# Patient Record
Sex: Female | Born: 1935
Health system: Southern US, Community
[De-identification: ages and names within clinical notes are randomized; demographics above are authoritative.]

## PROBLEM LIST (undated history)

## (undated) DIAGNOSIS — K635 Polyp of colon: Secondary | ICD-10-CM

## (undated) DIAGNOSIS — D649 Anemia, unspecified: Secondary | ICD-10-CM

## (undated) DIAGNOSIS — K648 Other hemorrhoids: Secondary | ICD-10-CM

## (undated) DIAGNOSIS — I80209 Phlebitis and thrombophlebitis of unspecified deep vessels of unspecified lower extremity: Secondary | ICD-10-CM

## (undated) DIAGNOSIS — S32020A Wedge compression fracture of second lumbar vertebra, initial encounter for closed fracture: Secondary | ICD-10-CM

## (undated) DIAGNOSIS — M359 Systemic involvement of connective tissue, unspecified: Secondary | ICD-10-CM

## (undated) DIAGNOSIS — F419 Anxiety disorder, unspecified: Secondary | ICD-10-CM

## (undated) DIAGNOSIS — H9190 Unspecified hearing loss, unspecified ear: Secondary | ICD-10-CM

## (undated) DIAGNOSIS — K5790 Diverticulosis of intestine, part unspecified, without perforation or abscess without bleeding: Secondary | ICD-10-CM

## (undated) DIAGNOSIS — Z8739 Personal history of other diseases of the musculoskeletal system and connective tissue: Secondary | ICD-10-CM

## (undated) DIAGNOSIS — K219 Gastro-esophageal reflux disease without esophagitis: Secondary | ICD-10-CM

## (undated) DIAGNOSIS — I10 Essential (primary) hypertension: Secondary | ICD-10-CM

## (undated) DIAGNOSIS — I82409 Acute embolism and thrombosis of unspecified deep veins of unspecified lower extremity: Secondary | ICD-10-CM

## (undated) DIAGNOSIS — H539 Unspecified visual disturbance: Secondary | ICD-10-CM

## (undated) DIAGNOSIS — R0789 Other chest pain: Secondary | ICD-10-CM

## (undated) DIAGNOSIS — M1990 Unspecified osteoarthritis, unspecified site: Secondary | ICD-10-CM

## (undated) DIAGNOSIS — K589 Irritable bowel syndrome without diarrhea: Secondary | ICD-10-CM

## (undated) DIAGNOSIS — I7090 Unspecified atherosclerosis: Secondary | ICD-10-CM

## (undated) HISTORY — DX: Polyp of colon: K63.5

## (undated) HISTORY — PX: REPLACEMENT TOTAL KNEE BILATERAL: SUR1225

## (undated) HISTORY — PX: EYE SURGERY: SHX253

## (undated) HISTORY — PX: DILATION AND CURETTAGE OF UTERUS: SHX78

## (undated) HISTORY — PX: APPENDECTOMY: SHX54

## (undated) HISTORY — DX: Phlebitis and thrombophlebitis of unspecified deep vessels of unspecified lower extremity: I80.209

## (undated) HISTORY — PX: JOINT REPLACEMENT: SHX530

## (undated) HISTORY — DX: Unspecified osteoarthritis, unspecified site: M19.90

---

## 2004-02-10 ENCOUNTER — Other Ambulatory Visit: Payer: Self-pay

## 2004-02-10 ENCOUNTER — Inpatient Hospital Stay: Payer: Self-pay | Admitting: Internal Medicine

## 2004-08-21 ENCOUNTER — Ambulatory Visit: Payer: Self-pay | Admitting: Unknown Physician Specialty

## 2006-03-11 ENCOUNTER — Ambulatory Visit: Payer: Self-pay | Admitting: Unknown Physician Specialty

## 2006-03-26 ENCOUNTER — Ambulatory Visit: Payer: Self-pay | Admitting: Unknown Physician Specialty

## 2006-05-20 ENCOUNTER — Encounter: Admission: RE | Admit: 2006-05-20 | Discharge: 2006-05-20 | Payer: Self-pay | Admitting: Orthopedic Surgery

## 2006-05-22 ENCOUNTER — Ambulatory Visit (HOSPITAL_BASED_OUTPATIENT_CLINIC_OR_DEPARTMENT_OTHER): Admission: RE | Admit: 2006-05-22 | Discharge: 2006-05-22 | Payer: Self-pay | Admitting: Orthopedic Surgery

## 2006-09-23 ENCOUNTER — Ambulatory Visit (HOSPITAL_BASED_OUTPATIENT_CLINIC_OR_DEPARTMENT_OTHER): Admission: RE | Admit: 2006-09-23 | Discharge: 2006-09-23 | Payer: Self-pay | Admitting: Orthopedic Surgery

## 2007-04-02 ENCOUNTER — Ambulatory Visit: Payer: Self-pay | Admitting: Unknown Physician Specialty

## 2007-04-13 ENCOUNTER — Ambulatory Visit: Payer: Self-pay | Admitting: Unknown Physician Specialty

## 2007-12-28 ENCOUNTER — Ambulatory Visit: Payer: Self-pay | Admitting: Rheumatology

## 2008-06-09 ENCOUNTER — Ambulatory Visit: Payer: Self-pay | Admitting: Unknown Physician Specialty

## 2008-11-22 ENCOUNTER — Ambulatory Visit: Payer: Self-pay | Admitting: Unknown Physician Specialty

## 2009-05-29 ENCOUNTER — Ambulatory Visit: Payer: Self-pay | Admitting: Unknown Physician Specialty

## 2009-06-12 ENCOUNTER — Ambulatory Visit: Payer: Self-pay | Admitting: Unknown Physician Specialty

## 2010-06-14 ENCOUNTER — Ambulatory Visit: Payer: Self-pay | Admitting: Obstetrics and Gynecology

## 2011-03-04 LAB — HM PAP SMEAR: HM Pap smear: NORMAL

## 2011-05-27 DIAGNOSIS — M722 Plantar fascial fibromatosis: Secondary | ICD-10-CM | POA: Diagnosis not present

## 2011-06-19 ENCOUNTER — Ambulatory Visit: Payer: Self-pay | Admitting: Internal Medicine

## 2011-06-19 DIAGNOSIS — Z1231 Encounter for screening mammogram for malignant neoplasm of breast: Secondary | ICD-10-CM | POA: Diagnosis not present

## 2011-06-20 DIAGNOSIS — M069 Rheumatoid arthritis, unspecified: Secondary | ICD-10-CM | POA: Diagnosis not present

## 2011-08-08 ENCOUNTER — Emergency Department: Payer: Self-pay | Admitting: Emergency Medicine

## 2011-08-08 DIAGNOSIS — Z79899 Other long term (current) drug therapy: Secondary | ICD-10-CM | POA: Diagnosis not present

## 2011-08-08 DIAGNOSIS — R079 Chest pain, unspecified: Secondary | ICD-10-CM | POA: Diagnosis not present

## 2011-08-08 DIAGNOSIS — M069 Rheumatoid arthritis, unspecified: Secondary | ICD-10-CM | POA: Diagnosis not present

## 2011-08-08 DIAGNOSIS — R0789 Other chest pain: Secondary | ICD-10-CM | POA: Diagnosis not present

## 2011-08-08 LAB — COMPREHENSIVE METABOLIC PANEL
Albumin: 3.9 g/dL (ref 3.4–5.0)
Alkaline Phosphatase: 65 U/L (ref 50–136)
Anion Gap: 12 (ref 7–16)
BUN: 12 mg/dL (ref 7–18)
Bilirubin,Total: 0.5 mg/dL (ref 0.2–1.0)
Calcium, Total: 9 mg/dL (ref 8.5–10.1)
Chloride: 106 mmol/L (ref 98–107)
Co2: 26 mmol/L (ref 21–32)
Creatinine: 0.59 mg/dL — ABNORMAL LOW (ref 0.60–1.30)
EGFR (African American): >60
EGFR (Non-African Amer.): >60
Glucose: 92 mg/dL (ref 65–99)
Osmolality: 286 (ref 275–301)
Potassium: 4.1 mmol/L (ref 3.5–5.1)
SGOT(AST): 34 U/L (ref 15–37)
SGPT (ALT): 23 U/L
Sodium: 144 mmol/L (ref 136–145)
Total Protein: 7.3 g/dL (ref 6.4–8.2)

## 2011-08-08 LAB — CBC
HCT: 40.8 % (ref 35.0–47.0)
HGB: 13.6 g/dL (ref 12.0–16.0)
MCH: 32 pg (ref 26.0–34.0)
MCHC: 33.3 g/dL (ref 32.0–36.0)
MCV: 96 fL (ref 80–100)
Platelet: 264 10*3/uL (ref 150–440)
RBC: 4.23 10*6/uL (ref 3.80–5.20)
RDW: 14.4 % (ref 11.5–14.5)
WBC: 5.8 10*3/uL (ref 3.6–11.0)

## 2011-08-08 LAB — PROTIME-INR
INR: 0.9
Prothrombin Time: 12.6 secs (ref 11.5–14.7)

## 2011-08-08 LAB — TROPONIN I
Troponin-I: <0.02 ng/mL
Troponin-I: <0.02 ng/mL

## 2011-08-08 LAB — CK TOTAL AND CKMB (NOT AT ARMC)
CK, Total: 152 U/L (ref 21–215)
CK-MB: 1.9 ng/mL (ref 0.5–3.6)

## 2011-08-19 DIAGNOSIS — M069 Rheumatoid arthritis, unspecified: Secondary | ICD-10-CM | POA: Diagnosis not present

## 2011-08-19 DIAGNOSIS — M722 Plantar fascial fibromatosis: Secondary | ICD-10-CM | POA: Diagnosis not present

## 2011-08-29 DIAGNOSIS — Z800 Family history of malignant neoplasm of digestive organs: Secondary | ICD-10-CM | POA: Diagnosis not present

## 2011-08-29 DIAGNOSIS — R079 Chest pain, unspecified: Secondary | ICD-10-CM | POA: Diagnosis not present

## 2011-08-30 DIAGNOSIS — R0609 Other forms of dyspnea: Secondary | ICD-10-CM | POA: Diagnosis not present

## 2011-08-30 DIAGNOSIS — R0989 Other specified symptoms and signs involving the circulatory and respiratory systems: Secondary | ICD-10-CM | POA: Diagnosis not present

## 2011-08-30 DIAGNOSIS — R079 Chest pain, unspecified: Secondary | ICD-10-CM | POA: Diagnosis not present

## 2011-08-30 DIAGNOSIS — I4949 Other premature depolarization: Secondary | ICD-10-CM | POA: Diagnosis not present

## 2011-09-10 DIAGNOSIS — R0602 Shortness of breath: Secondary | ICD-10-CM | POA: Diagnosis not present

## 2011-09-10 DIAGNOSIS — R079 Chest pain, unspecified: Secondary | ICD-10-CM | POA: Diagnosis not present

## 2011-09-12 ENCOUNTER — Emergency Department: Payer: Self-pay | Admitting: *Deleted

## 2011-09-12 DIAGNOSIS — M5380 Other specified dorsopathies, site unspecified: Secondary | ICD-10-CM | POA: Diagnosis not present

## 2011-09-12 DIAGNOSIS — M62838 Other muscle spasm: Secondary | ICD-10-CM | POA: Diagnosis not present

## 2011-09-12 DIAGNOSIS — S139XXA Sprain of joints and ligaments of unspecified parts of neck, initial encounter: Secondary | ICD-10-CM | POA: Diagnosis not present

## 2011-09-12 DIAGNOSIS — M069 Rheumatoid arthritis, unspecified: Secondary | ICD-10-CM | POA: Diagnosis not present

## 2011-09-12 DIAGNOSIS — Z7982 Long term (current) use of aspirin: Secondary | ICD-10-CM | POA: Diagnosis not present

## 2011-09-12 DIAGNOSIS — Z79899 Other long term (current) drug therapy: Secondary | ICD-10-CM | POA: Diagnosis not present

## 2011-09-17 DIAGNOSIS — R0789 Other chest pain: Secondary | ICD-10-CM | POA: Diagnosis not present

## 2011-09-18 DIAGNOSIS — M064 Inflammatory polyarthropathy: Secondary | ICD-10-CM | POA: Diagnosis not present

## 2011-09-18 DIAGNOSIS — M542 Cervicalgia: Secondary | ICD-10-CM | POA: Diagnosis not present

## 2011-09-18 DIAGNOSIS — M069 Rheumatoid arthritis, unspecified: Secondary | ICD-10-CM | POA: Diagnosis not present

## 2011-09-20 DIAGNOSIS — H26499 Other secondary cataract, unspecified eye: Secondary | ICD-10-CM | POA: Diagnosis not present

## 2011-10-08 DIAGNOSIS — J069 Acute upper respiratory infection, unspecified: Secondary | ICD-10-CM | POA: Diagnosis not present

## 2011-10-21 DIAGNOSIS — M549 Dorsalgia, unspecified: Secondary | ICD-10-CM | POA: Diagnosis not present

## 2011-12-05 DIAGNOSIS — R059 Cough, unspecified: Secondary | ICD-10-CM | POA: Diagnosis not present

## 2011-12-05 DIAGNOSIS — M65839 Other synovitis and tenosynovitis, unspecified forearm: Secondary | ICD-10-CM | POA: Diagnosis not present

## 2011-12-05 DIAGNOSIS — R079 Chest pain, unspecified: Secondary | ICD-10-CM | POA: Diagnosis not present

## 2011-12-05 DIAGNOSIS — R05 Cough: Secondary | ICD-10-CM | POA: Diagnosis not present

## 2011-12-12 DIAGNOSIS — R05 Cough: Secondary | ICD-10-CM | POA: Diagnosis not present

## 2011-12-12 DIAGNOSIS — K219 Gastro-esophageal reflux disease without esophagitis: Secondary | ICD-10-CM | POA: Diagnosis not present

## 2011-12-12 DIAGNOSIS — R059 Cough, unspecified: Secondary | ICD-10-CM | POA: Diagnosis not present

## 2011-12-17 DIAGNOSIS — R059 Cough, unspecified: Secondary | ICD-10-CM | POA: Diagnosis not present

## 2011-12-17 DIAGNOSIS — R05 Cough: Secondary | ICD-10-CM | POA: Diagnosis not present

## 2011-12-17 DIAGNOSIS — R918 Other nonspecific abnormal finding of lung field: Secondary | ICD-10-CM | POA: Diagnosis not present

## 2011-12-26 DIAGNOSIS — M064 Inflammatory polyarthropathy: Secondary | ICD-10-CM | POA: Diagnosis not present

## 2012-01-01 ENCOUNTER — Ambulatory Visit: Payer: Self-pay | Admitting: Rheumatology

## 2012-01-01 DIAGNOSIS — R079 Chest pain, unspecified: Secondary | ICD-10-CM | POA: Diagnosis not present

## 2012-01-01 DIAGNOSIS — R091 Pleurisy: Secondary | ICD-10-CM | POA: Diagnosis not present

## 2012-01-01 DIAGNOSIS — J984 Other disorders of lung: Secondary | ICD-10-CM | POA: Diagnosis not present

## 2012-01-02 ENCOUNTER — Ambulatory Visit: Payer: Self-pay | Admitting: Rheumatology

## 2012-01-02 DIAGNOSIS — N63 Unspecified lump in unspecified breast: Secondary | ICD-10-CM | POA: Diagnosis not present

## 2012-01-02 DIAGNOSIS — R928 Other abnormal and inconclusive findings on diagnostic imaging of breast: Secondary | ICD-10-CM | POA: Diagnosis not present

## 2012-01-02 LAB — HM MAMMOGRAPHY: HM Mammogram: NORMAL

## 2012-01-20 DIAGNOSIS — R05 Cough: Secondary | ICD-10-CM | POA: Diagnosis not present

## 2012-01-20 DIAGNOSIS — Z79899 Other long term (current) drug therapy: Secondary | ICD-10-CM | POA: Diagnosis not present

## 2012-01-20 DIAGNOSIS — M069 Rheumatoid arthritis, unspecified: Secondary | ICD-10-CM | POA: Diagnosis not present

## 2012-01-20 DIAGNOSIS — R059 Cough, unspecified: Secondary | ICD-10-CM | POA: Diagnosis not present

## 2012-01-20 DIAGNOSIS — Z800 Family history of malignant neoplasm of digestive organs: Secondary | ICD-10-CM | POA: Diagnosis not present

## 2012-02-19 LAB — HM COLONOSCOPY

## 2012-03-03 LAB — HM COLONOSCOPY: HM Colonoscopy: NORMAL

## 2012-03-04 DIAGNOSIS — M25519 Pain in unspecified shoulder: Secondary | ICD-10-CM | POA: Diagnosis not present

## 2012-03-04 DIAGNOSIS — M064 Inflammatory polyarthropathy: Secondary | ICD-10-CM | POA: Diagnosis not present

## 2012-03-17 ENCOUNTER — Ambulatory Visit: Payer: Self-pay | Admitting: Unknown Physician Specialty

## 2012-03-17 DIAGNOSIS — Z79899 Other long term (current) drug therapy: Secondary | ICD-10-CM | POA: Diagnosis not present

## 2012-03-17 DIAGNOSIS — Z800 Family history of malignant neoplasm of digestive organs: Secondary | ICD-10-CM | POA: Diagnosis not present

## 2012-03-17 DIAGNOSIS — Z7982 Long term (current) use of aspirin: Secondary | ICD-10-CM | POA: Diagnosis not present

## 2012-03-17 DIAGNOSIS — Z881 Allergy status to other antibiotic agents status: Secondary | ICD-10-CM | POA: Diagnosis not present

## 2012-03-17 DIAGNOSIS — M069 Rheumatoid arthritis, unspecified: Secondary | ICD-10-CM | POA: Diagnosis not present

## 2012-03-17 DIAGNOSIS — M19049 Primary osteoarthritis, unspecified hand: Secondary | ICD-10-CM | POA: Diagnosis not present

## 2012-03-17 DIAGNOSIS — D649 Anemia, unspecified: Secondary | ICD-10-CM | POA: Diagnosis not present

## 2012-03-17 DIAGNOSIS — Z1211 Encounter for screening for malignant neoplasm of colon: Secondary | ICD-10-CM | POA: Diagnosis not present

## 2012-03-17 DIAGNOSIS — R42 Dizziness and giddiness: Secondary | ICD-10-CM | POA: Diagnosis not present

## 2012-03-17 DIAGNOSIS — K5730 Diverticulosis of large intestine without perforation or abscess without bleeding: Secondary | ICD-10-CM | POA: Diagnosis not present

## 2012-03-17 DIAGNOSIS — Z888 Allergy status to other drugs, medicaments and biological substances status: Secondary | ICD-10-CM | POA: Diagnosis not present

## 2012-03-17 DIAGNOSIS — E785 Hyperlipidemia, unspecified: Secondary | ICD-10-CM | POA: Diagnosis not present

## 2012-03-17 DIAGNOSIS — I10 Essential (primary) hypertension: Secondary | ICD-10-CM | POA: Diagnosis not present

## 2012-03-17 DIAGNOSIS — Z96659 Presence of unspecified artificial knee joint: Secondary | ICD-10-CM | POA: Diagnosis not present

## 2012-03-17 DIAGNOSIS — K648 Other hemorrhoids: Secondary | ICD-10-CM | POA: Diagnosis not present

## 2012-03-23 DIAGNOSIS — M069 Rheumatoid arthritis, unspecified: Secondary | ICD-10-CM | POA: Diagnosis not present

## 2012-03-23 DIAGNOSIS — Z79899 Other long term (current) drug therapy: Secondary | ICD-10-CM | POA: Diagnosis not present

## 2012-03-24 DIAGNOSIS — E785 Hyperlipidemia, unspecified: Secondary | ICD-10-CM | POA: Diagnosis not present

## 2012-03-24 DIAGNOSIS — M899 Disorder of bone, unspecified: Secondary | ICD-10-CM | POA: Diagnosis not present

## 2012-03-24 DIAGNOSIS — Z79899 Other long term (current) drug therapy: Secondary | ICD-10-CM | POA: Diagnosis not present

## 2012-03-31 DIAGNOSIS — I10 Essential (primary) hypertension: Secondary | ICD-10-CM | POA: Diagnosis not present

## 2012-03-31 DIAGNOSIS — M069 Rheumatoid arthritis, unspecified: Secondary | ICD-10-CM | POA: Diagnosis not present

## 2012-04-08 DIAGNOSIS — S61209A Unspecified open wound of unspecified finger without damage to nail, initial encounter: Secondary | ICD-10-CM | POA: Diagnosis not present

## 2012-04-29 DIAGNOSIS — M79609 Pain in unspecified limb: Secondary | ICD-10-CM | POA: Diagnosis not present

## 2012-04-30 DIAGNOSIS — S61409A Unspecified open wound of unspecified hand, initial encounter: Secondary | ICD-10-CM | POA: Diagnosis not present

## 2012-04-30 DIAGNOSIS — M19049 Primary osteoarthritis, unspecified hand: Secondary | ICD-10-CM | POA: Diagnosis not present

## 2012-05-18 DIAGNOSIS — M069 Rheumatoid arthritis, unspecified: Secondary | ICD-10-CM | POA: Diagnosis not present

## 2012-05-18 DIAGNOSIS — Z79899 Other long term (current) drug therapy: Secondary | ICD-10-CM | POA: Diagnosis not present

## 2012-06-11 DIAGNOSIS — J069 Acute upper respiratory infection, unspecified: Secondary | ICD-10-CM | POA: Diagnosis not present

## 2012-07-13 DIAGNOSIS — M069 Rheumatoid arthritis, unspecified: Secondary | ICD-10-CM | POA: Diagnosis not present

## 2012-07-13 DIAGNOSIS — M65839 Other synovitis and tenosynovitis, unspecified forearm: Secondary | ICD-10-CM | POA: Diagnosis not present

## 2012-07-20 DIAGNOSIS — M722 Plantar fascial fibromatosis: Secondary | ICD-10-CM | POA: Diagnosis not present

## 2012-07-20 DIAGNOSIS — M7750 Other enthesopathy of unspecified foot: Secondary | ICD-10-CM | POA: Diagnosis not present

## 2012-07-20 DIAGNOSIS — L608 Other nail disorders: Secondary | ICD-10-CM | POA: Diagnosis not present

## 2012-08-17 DIAGNOSIS — M069 Rheumatoid arthritis, unspecified: Secondary | ICD-10-CM | POA: Diagnosis not present

## 2012-08-17 DIAGNOSIS — J069 Acute upper respiratory infection, unspecified: Secondary | ICD-10-CM | POA: Diagnosis not present

## 2012-08-18 DIAGNOSIS — R300 Dysuria: Secondary | ICD-10-CM | POA: Diagnosis not present

## 2012-08-24 DIAGNOSIS — M069 Rheumatoid arthritis, unspecified: Secondary | ICD-10-CM | POA: Diagnosis not present

## 2012-10-12 DIAGNOSIS — L608 Other nail disorders: Secondary | ICD-10-CM | POA: Diagnosis not present

## 2012-11-10 DIAGNOSIS — J069 Acute upper respiratory infection, unspecified: Secondary | ICD-10-CM | POA: Diagnosis not present

## 2012-11-16 DIAGNOSIS — Z79899 Other long term (current) drug therapy: Secondary | ICD-10-CM | POA: Diagnosis not present

## 2012-11-16 DIAGNOSIS — M069 Rheumatoid arthritis, unspecified: Secondary | ICD-10-CM | POA: Diagnosis not present

## 2012-12-04 DIAGNOSIS — H04129 Dry eye syndrome of unspecified lacrimal gland: Secondary | ICD-10-CM | POA: Diagnosis not present

## 2012-12-04 DIAGNOSIS — Z961 Presence of intraocular lens: Secondary | ICD-10-CM | POA: Diagnosis not present

## 2012-12-22 DIAGNOSIS — M069 Rheumatoid arthritis, unspecified: Secondary | ICD-10-CM | POA: Diagnosis not present

## 2013-03-02 ENCOUNTER — Encounter: Payer: Self-pay | Admitting: Internal Medicine

## 2013-03-02 ENCOUNTER — Ambulatory Visit (INDEPENDENT_AMBULATORY_CARE_PROVIDER_SITE_OTHER): Payer: Medicare Other | Admitting: Internal Medicine

## 2013-03-02 VITALS — BP 124/66 | HR 87 | Temp 98.7°F | Resp 14 | Ht 65.5 in | Wt 147.5 lb

## 2013-03-02 DIAGNOSIS — I679 Cerebrovascular disease, unspecified: Secondary | ICD-10-CM

## 2013-03-02 DIAGNOSIS — M069 Rheumatoid arthritis, unspecified: Secondary | ICD-10-CM | POA: Diagnosis not present

## 2013-03-02 DIAGNOSIS — G4700 Insomnia, unspecified: Secondary | ICD-10-CM

## 2013-03-02 DIAGNOSIS — R42 Dizziness and giddiness: Secondary | ICD-10-CM

## 2013-03-02 MED ORDER — TETANUS-DIPHTH-ACELL PERTUSSIS 5-2.5-18.5 LF-MCG/0.5 IM SUSP: 0.5000 mL | Freq: Once | INTRAMUSCULAR | Status: DC

## 2013-03-02 NOTE — Progress Notes (Addendum)
Patient ID: Teresa Lin, female   DOB: 10-24-1931, 79 y.o.   MRN: [id number]   Patient Active Problem List   Diagnosis Date Noted  . Insomnia 03/03/2013  . Vertigo due to cerebrovascular disease 03/03/2013  . Rheumatoid arthritis 03/03/2013    Subjective:  CC:   Chief Complaint  Patient presents with  . Establish Care    HPI:   Teresa Lin is a 79 y.o. female who presents as a new patient to establish primary care with the chief complaint of Cc:  Recurrent episodes of dizziness accompanied by nausea,  Last for a day or less, true vertigo .  Has to go lie down  Prior evaluation by neurologist Fayrene Fearing  Dr. Sandria Manly, with  MRI brain done at [facility] .  He diagnosed TIAS  As the etiology of her recurrent episodes. Annett Gula OTC Bonine (meclozine). Previously a patient of Dr Lin Givens,  And then Dr. Randa Lynn .  Last seen in spring for a URI.   [facility]  Had an x ray after several months ,  4 cracked ribs.  Has not had a bone density test in a few years  Has RA, managed with MTX and Placquenil by Lavenia Atlas.  On MRTX x 3 yrs,  Remicaide reaction a year ago  Then placquenil started, Last slit  lamp exam in May at [facility] ,  Has had prior cataracts with lens replacement by dr. Lauraine Rinne   Has occasional leg cramps which occur frequently at night or at rest .   does water aerobics 3/weekly.  Not strenuous.   Carpal tunnel bilateral surgery in the last 10 years.   7 children,  widowed 18 yrs ago.  2 live nearby .  bowel habits changed from constipation  to 2 to 3 times daily.  Usually formed.   Last colonoscopy  Normal last year Dr Mechele Collin along with an EGD  Last pelvic over 2 years ago.  No history of abnormal PAP smears.  Last mammogram: 2013 ,  Due now history of abnl no biopsies    Arthroscopy followed by DVT, [place]  Followed by bilateral TKRs at [facility] by Lessie Dings complicated by infection of unclear etiology finally resolved with vancomycin and other abx for  a total of 3 months .  ER evaluation chest pain,  Now on prilosec .    Uses ibuprofen prn to avoid prednisone during RA flares.   Insomnia:  She has frequent awakenings,  Dicky Doe   Past Medical History  Diagnosis Date  . Arthritis   . Phlebitis and thrombophlebitis of deep veins of lower extremities     after surgery  . Colon polyps     Past Surgical History  Procedure Laterality Date  . Appendectomy    . Joint replacement Bilateral     knee replacement    Family History  Problem Relation Age of Onset  . Heart disease Mother   . Hypertension Mother   . Cancer Mother     colon ca  . Heart disease Father   . Hypertension Father   . Hypertension Sister   . Multiple sclerosis Daughter   . Multiple sclerosis Son   . Multiple sclerosis Son     History   Social History  . Marital Status: Widowed    Spouse Name: N/A    Number of Children: N/A  . Years of Education: N/A   Occupational History  . Not on file.  Social History Main Topics  . Smoking status: Never Smoker   . Smokeless tobacco: Never Used  . Alcohol Use: 1.2 oz/week    2 Cans of beer per week     Comment: occasionally   . Drug Use: No  . Sexual Activity: No   Other Topics Concern  . Not on file   Social History Narrative  . No narrative on file       Allergies  Allergen Reactions  . Synvisc [Hylan G-F 20] Swelling     Review of Systems:   The remainder of the review of systems was negative except those addressed in the HPI.       Objective:  BP 124/66  Pulse 87  Temp(Src) 98.7 F (37.1 C) (Oral)  Resp 14  Ht 5' 5.5" (1.664 m)  Wt 147 lb 8 oz (66.906 kg)  BMI 24.16 kg/m2  SpO2 98%  General appearance: alert, cooperative and appears stated age Ears: normal TM's and external ear canals both ears Throat: lips, mucosa, and tongue normal; teeth and gums normal Neck: no adenopathy, no carotid bruit, supple, symmetrical, trachea midline and thyroid not enlarged,  symmetric, no tenderness/mass/nodules Back: symmetric, no curvature. ROM normal. No CVA tenderness. Lungs: clear to auscultation bilaterally Heart: regular rate and rhythm, S1, S2 normal, no murmur, click, rub or gallop Abdomen: soft, non-tender; bowel sounds normal; no masses,  no organomegaly Pulses: 2+ and symmetric Skin: Skin color, texture, turgor normal. No rashes or lesions Lymph nodes: Cervical, supraclavicular, and axillary nodes normal.  Assessment and Plan:  Insomnia Managed with Lunesta  Vertigo due to cerebrovascular disease Attributed to TIA's by Dr Sandria Manly, prior neurology evaluation.  records requested . MRI not available on [facility]. Return for fasting lipids   Rheumatoid arthritis Managed wit MTC and Plaquenil, up to date on eye exAams    Updated Medication List Outpatient Encounter Prescriptions as of 03/02/2013  Medication Sig Dispense Refill  . aspirin 81 MG tablet Take 81 mg by mouth daily.      . calcium-vitamin D (OSCAL WITH D) 500-200 MG-UNIT per tablet Take 1 tablet by mouth.      . folic acid (FOLVITE) 1 MG tablet Take 1 mg by mouth daily.      . hydroxychloroquine (PLAQUENIL) 200 MG tablet Take 200 mg by mouth daily.      . methotrexate (RHEUMATREX) 2.5 MG tablet Take 17.5 mg by mouth once a week.      . Multiple Vitamins-Minerals (MULTIVITAMIN WITH MINERALS) tablet Take 1 tablet by mouth daily.      Marland Kitchen omeprazole (PRILOSEC) 20 MG capsule Take 20 mg by mouth daily.      . predniSONE (DELTASONE) 5 MG tablet Take 5 mg by mouth daily.      . TDaP (BOOSTRIX) 5-2.5-18.5 LF-MCG/0.5 injection Inject 0.5 mLs into the muscle once.  0.5 mL  0   No facility-administered encounter medications on file as of 03/02/2013.     Orders Placed This Encounter  Procedures  . HM MAMMOGRAPHY  . HM PAP SMEAR  . HM COLONOSCOPY    No Follow-up on file.

## 2013-03-02 NOTE — Patient Instructions (Addendum)
You need the flu,  TdaP and Pneumonia vaccines.  The TdaP is not covered by Medicare so I have given you an rx to take to your pharmacy   Return for fasting labs and vaccines if you want to get them

## 2013-03-03 ENCOUNTER — Encounter: Payer: Self-pay | Admitting: Internal Medicine

## 2013-03-03 DIAGNOSIS — I679 Cerebrovascular disease, unspecified: Secondary | ICD-10-CM | POA: Insufficient documentation

## 2013-03-03 DIAGNOSIS — M0600 Rheumatoid arthritis without rheumatoid factor, unspecified site: Secondary | ICD-10-CM | POA: Insufficient documentation

## 2013-03-03 DIAGNOSIS — R42 Dizziness and giddiness: Secondary | ICD-10-CM | POA: Insufficient documentation

## 2013-03-03 DIAGNOSIS — G4700 Insomnia, unspecified: Secondary | ICD-10-CM | POA: Insufficient documentation

## 2013-03-03 NOTE — Assessment & Plan Note (Signed)
Managed with [facility]

## 2013-03-03 NOTE — Assessment & Plan Note (Addendum)
Attributed to TIA's by Dr Sandria Manly, prior neurology evaluation.  records requested . MRI not available on [facility]. Return for fasting lipids

## 2013-03-03 NOTE — Assessment & Plan Note (Signed)
Managed wit MTC and Plaquenil, up to date on eye exAams

## 2013-03-10 ENCOUNTER — Telehealth: Payer: Self-pay | Admitting: *Deleted

## 2013-03-10 ENCOUNTER — Other Ambulatory Visit: Payer: Self-pay | Admitting: Internal Medicine

## 2013-03-10 ENCOUNTER — Other Ambulatory Visit (INDEPENDENT_AMBULATORY_CARE_PROVIDER_SITE_OTHER): Payer: Medicare Other

## 2013-03-10 DIAGNOSIS — R5383 Other fatigue: Secondary | ICD-10-CM | POA: Diagnosis not present

## 2013-03-10 DIAGNOSIS — R5381 Other malaise: Secondary | ICD-10-CM | POA: Diagnosis not present

## 2013-03-10 DIAGNOSIS — Z79899 Other long term (current) drug therapy: Secondary | ICD-10-CM

## 2013-03-10 DIAGNOSIS — M129 Arthropathy, unspecified: Secondary | ICD-10-CM | POA: Diagnosis not present

## 2013-03-10 DIAGNOSIS — Z13220 Encounter for screening for lipoid disorders: Secondary | ICD-10-CM

## 2013-03-10 DIAGNOSIS — E559 Vitamin D deficiency, unspecified: Secondary | ICD-10-CM

## 2013-03-10 DIAGNOSIS — E550 Rickets, active: Secondary | ICD-10-CM | POA: Diagnosis not present

## 2013-03-10 LAB — COMPREHENSIVE METABOLIC PANEL
ALT: 16 U/L (ref 0–35)
AST: 28 U/L (ref 0–37)
Albumin: 4.2 g/dL (ref 3.5–5.2)
Alkaline Phosphatase: 62 U/L (ref 39–117)
BUN: 16 mg/dL (ref 6–23)
CO2: 30 mEq/L (ref 19–32)
Calcium: 9.5 mg/dL (ref 8.4–10.5)
Chloride: 102 mEq/L (ref 96–112)
Creatinine, Ser: 0.6 mg/dL (ref 0.4–1.2)
GFR: 95.09 mL/min (ref >60.00)
Glucose, Bld: 89 mg/dL (ref 70–99)
Potassium: 4.6 mEq/L (ref 3.5–5.1)
Sodium: 141 mEq/L (ref 135–145)
Total Bilirubin: 0.6 mg/dL (ref 0.3–1.2)
Total Protein: 6.9 g/dL (ref 6.0–8.3)

## 2013-03-10 LAB — CBC WITH DIFFERENTIAL/PLATELET
Basophils Absolute: 0 10*3/uL (ref 0.0–0.1)
Basophils Relative: 0.3 % (ref 0.0–3.0)
Eosinophils Absolute: 0.1 10*3/uL (ref 0.0–0.7)
Eosinophils Relative: 1 % (ref 0.0–5.0)
HCT: 42.5 % (ref 36.0–46.0)
Hemoglobin: 14.1 g/dL (ref 12.0–15.0)
Lymphocytes Relative: 19 % (ref 12.0–46.0)
Lymphs Abs: 1.3 10*3/uL (ref 0.7–4.0)
MCHC: 33.2 g/dL (ref 30.0–36.0)
MCV: 95.9 fl (ref 78.0–100.0)
Monocytes Absolute: 0.6 10*3/uL (ref 0.1–1.0)
Monocytes Relative: 8.2 % (ref 3.0–12.0)
Neutro Abs: 4.8 10*3/uL (ref 1.4–7.7)
Neutrophils Relative %: 71.5 % (ref 43.0–77.0)
Platelets: 309 10*3/uL (ref 150.0–400.0)
RBC: 4.43 Mil/uL (ref 3.87–5.11)
RDW: 14.9 % — ABNORMAL HIGH (ref 11.5–14.6)
WBC: 6.8 10*3/uL (ref 4.5–10.5)

## 2013-03-10 LAB — LIPID PANEL
Cholesterol: 181 mg/dL (ref 0–200)
HDL: 55.7 mg/dL (ref >39.00)
LDL Cholesterol: 112 mg/dL — ABNORMAL HIGH (ref 0–99)
Total CHOL/HDL Ratio: 3
Triglycerides: 67 mg/dL (ref 0.0–149.0)
VLDL: 13.4 mg/dL (ref 0.0–40.0)

## 2013-03-10 LAB — TSH: TSH: 1.42 u[IU]/mL (ref 0.35–5.50)

## 2013-03-10 NOTE — Telephone Encounter (Signed)
What labs and dx?  

## 2013-03-11 LAB — VITAMIN D 25 HYDROXY (VIT D DEFICIENCY, FRACTURES): Vit D, 25-Hydroxy: 47 ng/mL (ref 30–89)

## 2013-03-15 ENCOUNTER — Encounter: Payer: Self-pay | Admitting: *Deleted

## 2013-04-12 DIAGNOSIS — R141 Gas pain: Secondary | ICD-10-CM | POA: Diagnosis not present

## 2013-04-12 DIAGNOSIS — R1031 Right lower quadrant pain: Secondary | ICD-10-CM | POA: Diagnosis not present

## 2013-04-12 DIAGNOSIS — R1032 Left lower quadrant pain: Secondary | ICD-10-CM | POA: Diagnosis not present

## 2013-04-12 DIAGNOSIS — R142 Eructation: Secondary | ICD-10-CM | POA: Diagnosis not present

## 2013-04-13 DIAGNOSIS — R310 Gross hematuria: Secondary | ICD-10-CM | POA: Diagnosis not present

## 2013-04-13 DIAGNOSIS — R319 Hematuria, unspecified: Secondary | ICD-10-CM | POA: Diagnosis not present

## 2013-04-14 DIAGNOSIS — L821 Other seborrheic keratosis: Secondary | ICD-10-CM | POA: Diagnosis not present

## 2013-04-14 DIAGNOSIS — D485 Neoplasm of uncertain behavior of skin: Secondary | ICD-10-CM | POA: Diagnosis not present

## 2013-04-20 ENCOUNTER — Encounter: Payer: Self-pay | Admitting: Internal Medicine

## 2013-04-20 ENCOUNTER — Ambulatory Visit (INDEPENDENT_AMBULATORY_CARE_PROVIDER_SITE_OTHER): Payer: Medicare Other | Admitting: Internal Medicine

## 2013-04-20 ENCOUNTER — Encounter (INDEPENDENT_AMBULATORY_CARE_PROVIDER_SITE_OTHER): Payer: Self-pay

## 2013-04-20 VITALS — BP 130/68 | HR 72 | Temp 98.4°F | Resp 12 | Ht 65.5 in | Wt 145.8 lb

## 2013-04-20 DIAGNOSIS — K625 Hemorrhage of anus and rectum: Secondary | ICD-10-CM | POA: Diagnosis not present

## 2013-04-20 DIAGNOSIS — N390 Urinary tract infection, site not specified: Secondary | ICD-10-CM

## 2013-04-20 DIAGNOSIS — K648 Other hemorrhoids: Secondary | ICD-10-CM | POA: Diagnosis not present

## 2013-04-20 LAB — POCT URINALYSIS DIPSTICK
Bilirubin, UA: NEGATIVE
Glucose, UA: NEGATIVE
Ketones, UA: NEGATIVE
Nitrite, UA: NEGATIVE
Protein, UA: NEGATIVE
Spec Grav, UA: 1.02
Urobilinogen, UA: 0.2
pH, UA: 7.5

## 2013-04-20 MED ORDER — HYDROCORTISONE ACETATE 25 MG RE SUPP: 25.0000 mg | Freq: Two times a day (BID) | RECTAL | Status: DC

## 2013-04-20 MED ORDER — DOCUSATE SODIUM 100 MG PO CAPS: 100.0000 mg | ORAL_CAPSULE | Freq: Two times a day (BID) | ORAL | Status: DC

## 2013-04-20 NOTE — Progress Notes (Signed)
Patient ID: Teresa Lin, female   DOB: 1932-01-27, 79 y.o.   MRN: [id number]   Patient Active Problem List   Diagnosis Date Noted  . Internal hemorrhoids without complication 04/20/2013  . Rectal bleeding 04/20/2013  . Insomnia 03/03/2013  . Vertigo due to cerebrovascular disease 03/03/2013  . Rheumatoid arthritis 03/03/2013    Subjective:  CC:   Chief Complaint  Patient presents with  . Acute Visit    Blood urine or bowel patient not sure.    HPI:   Teresa Lin a 79 y.o. female who presents Recurrent bleeding, unsure of source as either from bowel or bladder.  Had 2 days of gross hematuria after Thanksgiving.  Then last week had blood when she had a bowel movement a day prior to having a loose stool .  Still having mild suprapubic cramping prior to urinating, intermittently.    Tried to see Dr Mechele Collin, but saw PA Fransico Setters, who did a FOBT which was negative and recommended seeing a urologist.  Was seen by [facility] last Friday,   Had a pelvic exam,  FOBT and a cystoscopy which were normal . . UA was normal Dec 2  CT of pelvic area is scheduled for next  Thursday at [facility]   Has internal hemorrhoids and diverticulosis  noted on colonoscopy was was done Oct 2013.   Not sleeping well, although goes to bed exhausted and falls asleep easily.  nocturia x 1 to 2.  Has luensta which she has not used recently.   Needs 2nd pneumonia vaccine and influenza  Needs tdap rx told to check with her rheumatologist about she can receive the Zostavax.    Past Medical History  Diagnosis Date  . Arthritis   . Phlebitis and thrombophlebitis of deep veins of lower extremities     after surgery  . Colon polyps     Past Surgical History  Procedure Laterality Date  . Appendectomy    . Joint replacement Bilateral     knee replacement       The following portions of the patient's history were reviewed and updated as appropriate: Allergies, current medications, and problem list.    Review of  Systems:   12 Pt  review of systems was negative except those addressed in the HPI,     History   Social History  . Marital Status: Widowed    Spouse Name: N/A    Number of Children: N/A  . Years of Education: N/A   Occupational History  . Not on file.   Social History Main Topics  . Smoking status: Never Smoker   . Smokeless tobacco: Never Used  . Alcohol Use: 1.2 oz/week    2 Cans of beer per week     Comment: occasionally   . Drug Use: No  . Sexual Activity: No   Other Topics Concern  . Not on file   Social History Narrative  . No narrative on file    Objective:  Filed Vitals:   04/20/13 0923  BP: 130/68  Pulse: 72  Temp: 98.4 F (36.9 C)  Resp: 12     General appearance: alert, cooperative and appears stated age Ears: normal TM's and external ear canals both ears Throat: lips, mucosa, and tongue normal; teeth and gums normal Neck: no adenopathy, no carotid bruit, supple, symmetrical, trachea midline and thyroid not enlarged, symmetric, no tenderness/mass/nodules Back: symmetric, no curvature. ROM normal. No CVA tenderness. Lungs: clear to auscultation bilaterally Heart: regular rate and rhythm,  S1, S2 normal, no murmur, click, rub or gallop Abdomen: soft, non-tender; bowel sounds normal; no masses,  no organomegaly Pulses: 2+ and symmetric Skin: Skin color, texture, turgor normal. No rashes or lesions Lymph nodes: Cervical, supraclavicular, and axillary nodes normal.  Assessment and Plan:  Rectal bleeding Secondary to hemorrhoids vs diverticular bleed.  Has had w negative FOBTs.  Trial of anusol hc suppositories and stool softener   Internal hemorrhoids without complication Noted on priro colonoscopy report.    Updated Medication List Outpatient Encounter Prescriptions as of 04/20/2013  Medication Sig  . aspirin 81 MG tablet Take 81 mg by mouth daily.  . calcium-vitamin D (OSCAL WITH D) 500-200 MG-UNIT per tablet Take 1 tablet by mouth.  .  folic acid (FOLVITE) 1 MG tablet Take 1 mg by mouth daily.  . hydroxychloroquine (PLAQUENIL) 200 MG tablet Take 200 mg by mouth daily.  . methotrexate (RHEUMATREX) 2.5 MG tablet Take 17.5 mg by mouth once a week.  . Multiple Vitamins-Minerals (MULTIVITAMIN WITH MINERALS) tablet Take 1 tablet by mouth daily.  Marland Kitchen omeprazole (PRILOSEC) 20 MG capsule Take 20 mg by mouth daily.  Marland Kitchen docusate sodium (COLACE) 100 MG capsule Take 1 capsule (100 mg total) by mouth 2 (two) times daily.  . hydrocortisone (ANUSOL-HC) 25 MG suppository Place 1 suppository (25 mg total) rectally 2 (two) times daily.  . predniSONE (DELTASONE) 5 MG tablet Take 5 mg by mouth daily.  . TDaP (BOOSTRIX) 5-2.5-18.5 LF-MCG/0.5 injection Inject 0.5 mLs into the muscle once.     Orders Placed This Encounter  Procedures  . Urine culture  . Urinalysis, microscopic only  . POCT urinalysis dipstick  . HM COLONOSCOPY    No Follow-up on file.

## 2013-04-20 NOTE — Patient Instructions (Signed)
Rectal Bleeding Rectal bleeding is when blood passes out of the anus. It is usually a sign that something is wrong. It may not be serious, but it should always be evaluated. Rectal bleeding may present as bright red blood or extremely dark stools. The color may range from dark red or maroon to black (like tar). It is important that the cause of rectal bleeding be identified so treatment can be started and the problem corrected. CAUSES   Hemorrhoids. These are enlarged (dilated) blood vessels or veins in the anal or rectal area.  Fistulas. Theseare abnormal, burrowing channels that usually run from inside the rectum to the skin around the anus. They can bleed.  Anal fissures. This is a tear in the tissue of the anus. Bleeding occurs with bowel movements.  Diverticulosis. This is a condition in which pockets or sacs project from the bowel wall. Occasionally, the sacs can bleed.  Diverticulitis. Thisis an infection involving diverticulosis of the colon.  Proctitis and colitis. These are conditions in which the rectum, colon, or both, can become inflamed and pitted (ulcerated).  Polyps and cancer. Polyps are non-cancerous (benign) growths in the colon that may bleed. Certain types of polyps turn into cancer.  Protrusion of the rectum. Part of the rectum can project from the anus and bleed.  Certain medicines.  Intestinal infections.  Blood vessel abnormalities. HOME CARE INSTRUCTIONS  Eat a high-fiber diet to keep your stool soft.  Limit activity.  Drink enough fluids to keep your urine clear or pale yellow.  Warm baths may be useful to soothe rectal pain.  Follow up with your caregiver as directed. SEEK IMMEDIATE MEDICAL CARE IF:  You develop increased bleeding.  You have black or dark red stools.  You vomit blood or material that looks like coffee grounds.  You have abdominal pain or tenderness.  You have a fever.  You feel weak, nauseous, or you faint.  You have  severe rectal pain or you are unable to have a bowel movement. MAKE SURE YOU:  Understand these instructions.  Will watch your condition.  Will get help right away if you are not doing well or get worse. Document Released: 10/19/2001 Document Revised: 07/22/2011 Document Reviewed: 10/14/2010 [facility] Patient Information 2014 [place], [place].       Hydrocortisone suppositories What is this medicine? HYDROCORTISONE (hye droe KOR ti sone) is a corticosteroid. It is used to decrease swelling, itching, and pain that is caused by minor skin irritations or by hemorrhoids. This medicine may be used for other purposes; ask your health care provider or pharmacist if you have questions. COMMON BRAND NAME(S): Anucort-HC, Anumed-HC, Anusol HC, Encort, Hemorrhoidal-HC, Hemril , Proctocort, Proctosert HC , Proctosol-HC, Rectasol-HC What should I tell my health care provider before I take this medicine? They need to know if you have any of these conditions: -an unusual or allergic reaction to hydrocortisone, corticosteroids, other medicines, foods, dyes, or preservatives -pregnant or trying to get pregnant -breast-feeding How should I use this medicine? This medicine is for rectal use only. Do not take by mouth. Wash your hands before and after use. Take off the foil wrapping. Wet the tip of the suppository with cold tap water to make it easier to use. Lie on your side with your lower leg straightened out and your upper leg bent forward toward your stomach. Lift upper buttock to expose the rectal area. Apply gentle pressure to insert the suppository completely into the rectum, pointed end first. Hold buttocks together for a  few seconds. Remain lying down for about 15 minutes to avoid having the suppository come out. Do not use more often than directed. Talk to your pediatrician regarding the use of this medicine in children. Special care may be needed. Overdosage: If you think you have taken too much of  this medicine contact a poison control center or emergency room at once. NOTE: This medicine is only for you. Do not share this medicine with others. What if I miss a dose? If you miss a dose, use it as soon as you can. If it is almost time for your next dose, use only that dose. Do not use double or extra doses. What may interact with this medicine? Interactions are not expected. Do not use any other rectal products on the affected area without telling your doctor or health care professional. This list may not describe all possible interactions. Give your health care provider a list of all the medicines, herbs, non-prescription drugs, or dietary supplements you use. Also tell them if you smoke, drink alcohol, or use illegal drugs. Some items may interact with your medicine. What should I watch for while using this medicine? Visit your doctor or health care professional for regular checks on your progress. Tell your doctor or health care professional if your symptoms do not improve after a few days of use. Do not use if there is blood in your stools. If you get any type of infection while using this medicine, you may need to stop using this medicine until our infections clears up. Ask your doctor or health care professional for advice. What side effects may I notice from receiving this medicine? Side effects that you should report to your doctor or health care professional as soon as possible: -bloody or black, tarry stools -painful, red, pus filled blisters in hair follicles -rectal pain, burning or bleeding after use of medicine Side effects that usually do not require medical attention (report to your doctor or health care professional if they continue or are bothersome): -changes in skin color -dry skin -itching or irritation This list may not describe all possible side effects. Call your doctor for medical advice about side effects. You may report side effects to FDA at 1-800-FDA-1088. Where  should I keep my medicine? Keep out of the reach of children. Store at room temperature between 20 and 25 degrees C (68 and 77 degrees F). Protect from heat and freezing. Throw away any unused medicine after the expiration date. NOTE: This sheet is a summary. It may not cover all possible information. If you have questions about this medicine, talk to your doctor, pharmacist, or health care provider.  2014, Elsevier/Gold Standard. (2007-09-11 16:07:24)

## 2013-04-21 ENCOUNTER — Encounter: Payer: Self-pay | Admitting: Internal Medicine

## 2013-04-21 NOTE — Assessment & Plan Note (Addendum)
Secondary to hemorrhoids vs diverticular bleed.  Has had w negative FOBTs.  Trial of anusol hc suppositories and stool softener

## 2013-04-21 NOTE — Assessment & Plan Note (Signed)
Noted on priro colonoscopy report.

## 2013-04-22 LAB — URINE CULTURE
Colony Count: NO GROWTH
Organism ID, Bacteria: NO GROWTH

## 2013-04-29 DIAGNOSIS — R310 Gross hematuria: Secondary | ICD-10-CM | POA: Diagnosis not present

## 2013-05-12 ENCOUNTER — Telehealth: Payer: Self-pay | Admitting: Internal Medicine

## 2013-05-12 ENCOUNTER — Other Ambulatory Visit: Payer: Self-pay | Admitting: *Deleted

## 2013-05-12 MED ORDER — OMEPRAZOLE 20 MG PO CPDR: 20.0000 mg | DELAYED_RELEASE_CAPSULE | Freq: Every day | ORAL | Status: DC

## 2013-05-12 NOTE — Telephone Encounter (Signed)
The patient called to see if any of her records have been sent from [facility] .

## 2013-05-12 NOTE — Telephone Encounter (Signed)
Pt came in today and dropped off a disk from [facility].  It is her ct scan of her abd.  She wanted to know if dr Darrick Huntsman wanted to see this or if she needed to take this to her GI dr.  Rock Nephew also wanted to know when dr Darrick Huntsman wanted her to come back for follow up visit  CD in box

## 2013-05-17 NOTE — Telephone Encounter (Signed)
I did not receive the disk!  Please discourage patients from dropping things like that off. Because now she wants it back and i do not have it

## 2013-05-17 NOTE — Telephone Encounter (Signed)
I do not routinely look at disks, especially since i did not order the study.  I rely on the reports from the radiologist,  Which I have seen, alon giwth the urology reportts .  She will need to request another copy of the disk unless Teresa Lin or you can find it. It is not in my office.

## 2013-05-17 NOTE — Telephone Encounter (Signed)
Patient stopped back by would like to know if you had a chance to look at the disc she dropped off she would like to come by to pick it up to take with her to the GI.  Please let her know when this can be picked up she would like to pick it up this week.

## 2013-05-18 NOTE — Telephone Encounter (Signed)
Left message for patient to return my call.

## 2013-05-19 NOTE — Telephone Encounter (Signed)
i made patient with dr Derrel Nip on Friday 05/21/12.  She stated she would feel better about talking to dr Derrel Nip about what to do next

## 2013-05-19 NOTE — Telephone Encounter (Signed)
Pt wanted to know if dr Derrel Nip seen the report for the ct scan she had done @ [facility] on 04/29/13.  Please let pt know

## 2013-05-20 DIAGNOSIS — M069 Rheumatoid arthritis, unspecified: Secondary | ICD-10-CM | POA: Diagnosis not present

## 2013-05-20 DIAGNOSIS — Z79899 Other long term (current) drug therapy: Secondary | ICD-10-CM | POA: Diagnosis not present

## 2013-05-20 NOTE — Telephone Encounter (Signed)
Printed off for reference for appointment.

## 2013-05-21 ENCOUNTER — Ambulatory Visit (INDEPENDENT_AMBULATORY_CARE_PROVIDER_SITE_OTHER): Payer: Medicare Other | Admitting: Internal Medicine

## 2013-05-21 ENCOUNTER — Encounter: Payer: Self-pay | Admitting: Internal Medicine

## 2013-05-21 VITALS — BP 114/60 | HR 87 | Temp 98.1°F | Resp 14 | Wt 144.5 lb

## 2013-05-21 DIAGNOSIS — K625 Hemorrhage of anus and rectum: Secondary | ICD-10-CM

## 2013-05-21 DIAGNOSIS — R5381 Other malaise: Secondary | ICD-10-CM

## 2013-05-21 DIAGNOSIS — R5383 Other fatigue: Principal | ICD-10-CM

## 2013-05-21 DIAGNOSIS — E538 Deficiency of other specified B group vitamins: Secondary | ICD-10-CM | POA: Diagnosis not present

## 2013-05-21 LAB — CBC WITH DIFFERENTIAL/PLATELET
Basophils Absolute: 0 10*3/uL (ref 0.0–0.1)
Basophils Relative: 0 % (ref 0–1)
Eosinophils Absolute: 0.1 10*3/uL (ref 0.0–0.7)
Eosinophils Relative: 1 % (ref 0–5)
HCT: 41.2 % (ref 36.0–46.0)
Hemoglobin: 14.1 g/dL (ref 12.0–15.0)
Lymphocytes Relative: 26 % (ref 12–46)
Lymphs Abs: 1.6 10*3/uL (ref 0.7–4.0)
MCH: 31.5 pg (ref 26.0–34.0)
MCHC: 34.2 g/dL (ref 30.0–36.0)
MCV: 92.2 fL (ref 78.0–100.0)
Monocytes Absolute: 0.4 10*3/uL (ref 0.1–1.0)
Monocytes Relative: 6 % (ref 3–12)
Neutro Abs: 3.9 10*3/uL (ref 1.7–7.7)
Neutrophils Relative %: 67 % (ref 43–77)
Platelets: 298 10*3/uL (ref 150–400)
RBC: 4.47 MIL/uL (ref 3.87–5.11)
RDW: 14.4 % (ref 11.5–15.5)
WBC: 5.9 10*3/uL (ref 4.0–10.5)

## 2013-05-21 NOTE — Progress Notes (Signed)
Pre-visit discussion using our clinic review tool. No additional management support is needed unless otherwise documented below in the visit note.  

## 2013-05-21 NOTE — Progress Notes (Signed)
Patient ID: Teresa Lin, female   DOB: 1931-10-16, 80 y.o.   MRN: [id number]   Patient Active Problem List   Diagnosis Date Noted  . Other malaise and fatigue 05/23/2013  . Internal hemorrhoids without complication 40/98/1191  . Rectal bleeding 04/20/2013  . Insomnia 03/03/2013  . Vertigo due to cerebrovascular disease 03/03/2013  . Rheumatoid arthritis 03/03/2013    Subjective:  CC:   Chief Complaint  Patient presents with  . Follow-up    Ct scan    HPI:   Teresa Lin a 80 y.o. female who presents for follow up on rectal bleeding vs hematuria.  She underwent a CT with contrast which was normal except for tiny liver lesion and kidney lesion, both were too small to characterize.  She has had no additional episodes of bleeding since last visit and feel fine other than slight fatigue at the end of the day.    Past Medical History  Diagnosis Date  . Arthritis   . Phlebitis and thrombophlebitis of deep veins of lower extremities     after surgery  . Colon polyps     Past Surgical History  Procedure Laterality Date  . Appendectomy    . Joint replacement Bilateral     knee replacement       The following portions of the patient's history were reviewed and updated as appropriate: Allergies, current medications, and problem list.    Review of Systems:   12 Pt  review of systems was negative except those addressed in the HPI,     History   Social History  . Marital Status: Widowed    Spouse Name: N/A    Number of Children: N/A  . Years of Education: N/A   Occupational History  . Not on file.   Social History Main Topics  . Smoking status: Never Smoker   . Smokeless tobacco: Never Used  . Alcohol Use: 1.2 oz/week    2 Cans of beer per week     Comment: occasionally   . Drug Use: No  . Sexual Activity: No   Other Topics Concern  . Not on file   Social History Narrative  . No narrative on file    Objective:  Filed Vitals:   05/21/13 1453  BP:  114/60  Pulse: 87  Temp: 98.1 F (36.7 C)  Resp: 14     General appearance: alert, cooperative and appears stated age Ears: normal TM's and external ear canals both ears Throat: lips, mucosa, and tongue normal; teeth and gums normal Neck: no adenopathy, no carotid bruit, supple, symmetrical, trachea midline and thyroid not enlarged, symmetric, no tenderness/mass/nodules Back: symmetric, no curvature. ROM normal. No CVA tenderness. Lungs: clear to auscultation bilaterally Heart: regular rate and rhythm, S1, S2 normal, no murmur, click, rub or gallop Abdomen: soft, non-tender; bowel sounds normal; no masses,  no organomegaly Pulses: 2+ and symmetric Skin: Skin color, texture, turgor normal. No rashes or lesions Lymph nodes: Cervical, supraclavicular, and axillary nodes normal.  Assessment and Plan:  Other malaise and fatigue Given her age,  Her daytime acitivities. History of RA  and chronic insomnia are likely the cause.,  But will check B12 and CBC.  Thyroid was normal in October.   Rectal bleeding Presumed to be a diverticular bleed given absence of other findings on cystoscopy, vaginal exam , and CT.  High fiber diet discussed.    Updated Medication List Outpatient Encounter Prescriptions as of 05/21/2013  Medication Sig  . methotrexate (RHEUMATREX) 2.5  MG tablet Take 22.5 mg by mouth once a week.   Marland Kitchen aspirin 81 MG tablet Take 81 mg by mouth daily.  . calcium-vitamin D (OSCAL WITH D) 500-200 MG-UNIT per tablet Take 1 tablet by mouth.  . docusate sodium (COLACE) 100 MG capsule Take 1 capsule (100 mg total) by mouth 2 (two) times daily.  . folic acid (FOLVITE) 1 MG tablet Take 1 mg by mouth daily.  . hydrocortisone (ANUSOL-HC) 25 MG suppository Place 1 suppository (25 mg total) rectally 2 (two) times daily.  . hydroxychloroquine (PLAQUENIL) 200 MG tablet Take 200 mg by mouth daily.  . Multiple Vitamins-Minerals (MULTIVITAMIN WITH MINERALS) tablet Take 1 tablet by mouth daily.  Marland Kitchen  omeprazole (PRILOSEC) 20 MG capsule Take 1 capsule (20 mg total) by mouth daily.  . predniSONE (DELTASONE) 5 MG tablet Take 5 mg by mouth daily.  . TDaP (BOOSTRIX) 5-2.5-18.5 LF-MCG/0.5 injection Inject 0.5 mLs into the muscle once.  . [DISCONTINUED] methotrexate (RHEUMATREX) 2.5 MG tablet Take 9 tablets by mouth once a week.     Orders Placed This Encounter  Procedures  . Vitamin B12  . Folate RBC  . CBC with Differential    No Follow-up on file.

## 2013-05-21 NOTE — Patient Instructions (Signed)
I believe you had a transient diverticular bleed  You should continue a high fiber diet but avoid nuts .  25 to 35 gm of fiber is your goal    Allbran plus Nature's Path Pumpkin raisin crunch  Cereal  Will get you  22 gram  OR   Mission makes a low carb whole wheat tortilla that has 26 g fiber  Flat Out also makes a high fiber flatbread   Beans,  Fruits with skins ,  Also have lots of fiber    Diverticulosis Diverticulosis is a common condition that develops when small pouches (diverticula) form in the wall of the colon. The risk of diverticulosis increases with age. It happens more often in people who eat a low-fiber diet. Most individuals with diverticulosis have no symptoms. Those individuals with symptoms usually experience abdominal pain, constipation, or loose stools (diarrhea). HOME CARE INSTRUCTIONS   Increase the amount of fiber in your diet as directed by your caregiver or dietician. This may reduce symptoms of diverticulosis.  Your caregiver may recommend taking a dietary fiber supplement.  Drink at least 6 to 8 glasses of water each day to prevent constipation.  Try not to strain when you have a bowel movement.  Your caregiver may recommend avoiding nuts and seeds to prevent complications, although this is still an uncertain benefit.  Only take over-the-counter or prescription medicines for pain, discomfort, or fever as directed by your caregiver. FOODS WITH HIGH FIBER CONTENT INCLUDE:  Fruits. Apple, peach, pear, tangerine, raisins, prunes.  Vegetables. Brussels sprouts, asparagus, broccoli, cabbage, carrot, cauliflower, romaine lettuce, spinach, summer squash, tomato, winter squash, zucchini.  Starchy Vegetables. Baked beans, kidney beans, lima beans, split peas, lentils, potatoes (with skin).  Grains. Whole wheat bread, brown rice, bran flake cereal, plain oatmeal, white rice, shredded wheat, bran muffins. SEEK IMMEDIATE MEDICAL CARE IF:   You develop  increasing pain or severe bloating.  You have an oral temperature above 102 F (38.9 C), not controlled by medicine.  You develop vomiting or bowel movements that are bloody or black. Document Released: 01/25/2004 Document Revised: 07/22/2011 Document Reviewed: 09/27/2009 [facility] Patient Information 2014 [place].

## 2013-05-22 LAB — VITAMIN B12: Vitamin B-12: 389 pg/mL (ref 211–911)

## 2013-05-23 DIAGNOSIS — R5383 Other fatigue: Secondary | ICD-10-CM | POA: Insufficient documentation

## 2013-05-23 NOTE — Assessment & Plan Note (Addendum)
Given her age,  Her daytime acitivities. History of RA  and chronic insomnia are likely the cause.,  But will check B12 and CBC.  Thyroid was normal in October.

## 2013-05-23 NOTE — Assessment & Plan Note (Addendum)
Presumed to be a diverticular bleed given absence of other findings on cystoscopy, vaginal exam , and CT.  High fiber diet discussed.

## 2013-05-24 DIAGNOSIS — M069 Rheumatoid arthritis, unspecified: Secondary | ICD-10-CM | POA: Diagnosis not present

## 2013-05-24 DIAGNOSIS — L608 Other nail disorders: Secondary | ICD-10-CM | POA: Diagnosis not present

## 2013-05-24 DIAGNOSIS — M898X9 Other specified disorders of bone, unspecified site: Secondary | ICD-10-CM | POA: Diagnosis not present

## 2013-05-24 LAB — FOLATE RBC: RBC Folate: 510 ng/mL (ref >280)

## 2013-05-27 ENCOUNTER — Encounter: Payer: Self-pay | Admitting: *Deleted

## 2013-07-19 DIAGNOSIS — L918 Other hypertrophic disorders of the skin: Secondary | ICD-10-CM | POA: Diagnosis not present

## 2013-07-19 DIAGNOSIS — L578 Other skin changes due to chronic exposure to nonionizing radiation: Secondary | ICD-10-CM | POA: Diagnosis not present

## 2013-07-19 DIAGNOSIS — L908 Other atrophic disorders of skin: Secondary | ICD-10-CM | POA: Diagnosis not present

## 2013-07-19 DIAGNOSIS — C44320 Squamous cell carcinoma of skin of unspecified parts of face: Secondary | ICD-10-CM | POA: Diagnosis not present

## 2013-07-23 DIAGNOSIS — T300 Burn of unspecified body region, unspecified degree: Secondary | ICD-10-CM | POA: Diagnosis not present

## 2013-07-26 DIAGNOSIS — Z85828 Personal history of other malignant neoplasm of skin: Secondary | ICD-10-CM | POA: Diagnosis not present

## 2013-07-26 DIAGNOSIS — T300 Burn of unspecified body region, unspecified degree: Secondary | ICD-10-CM | POA: Diagnosis not present

## 2013-08-23 DIAGNOSIS — M069 Rheumatoid arthritis, unspecified: Secondary | ICD-10-CM | POA: Diagnosis not present

## 2013-09-11 DIAGNOSIS — T300 Burn of unspecified body region, unspecified degree: Secondary | ICD-10-CM | POA: Diagnosis not present

## 2013-09-13 ENCOUNTER — Encounter: Payer: Self-pay | Admitting: Internal Medicine

## 2013-09-13 ENCOUNTER — Ambulatory Visit (INDEPENDENT_AMBULATORY_CARE_PROVIDER_SITE_OTHER): Payer: Medicare Other | Admitting: Internal Medicine

## 2013-09-13 VITALS — BP 130/86 | HR 70 | Temp 98.2°F | Wt 147.0 lb

## 2013-09-13 DIAGNOSIS — S81009A Unspecified open wound, unspecified knee, initial encounter: Secondary | ICD-10-CM

## 2013-09-13 DIAGNOSIS — Z23 Encounter for immunization: Secondary | ICD-10-CM | POA: Diagnosis not present

## 2013-09-13 DIAGNOSIS — S91009A Unspecified open wound, unspecified ankle, initial encounter: Secondary | ICD-10-CM

## 2013-09-13 DIAGNOSIS — S81809A Unspecified open wound, unspecified lower leg, initial encounter: Secondary | ICD-10-CM

## 2013-09-13 DIAGNOSIS — S81802A Unspecified open wound, left lower leg, initial encounter: Secondary | ICD-10-CM

## 2013-09-13 NOTE — Progress Notes (Signed)
Pre visit review using our clinic review tool, if applicable. No additional management support is needed unless otherwise documented below in the visit note. 

## 2013-09-13 NOTE — Assessment & Plan Note (Addendum)
Encouraged her to apply ice prn to contusion upper part of left lower leg. Will have her apply Telfa to skin tear left lower leg. Encouraged use of compression hose. She was given Rx for Keflex at [facility] which she will start if signs/symptoms of infection. Plan recheck wound in 1 week and prn.

## 2013-09-13 NOTE — Patient Instructions (Signed)
Avoid getting wound wet in the bath. You may clean the wound with saline.  Apply Telfa dressing daily.  Monitor wound for signs of infection, such as purulent drainage or increasing redness. If you see signs of infection, start Keflex as directed.  Wear compression hose, knee high. I recommend Mediven.  We will recheck wound in 1 week or sooner as needed.

## 2013-09-13 NOTE — Progress Notes (Signed)
   Subjective:    Patient ID: Teresa Lin, female    DOB: 02-Sep-1931, 80 y.o.   MRN: [id number]  HPI 80YO female presents for acute, walk-in visit. Fell on Saturday while walking up some steps. Hit left shin on concrete step, leading to wound. Was seen at [facility]. Told to bandage wound. No stitches. Has been applying Neosporin and gauze. Concerned about risk of infection given h/o RA. Concerned about risk of DVT.  No fever, chills. Little-no pain at wound site. No purulent drainage noted from wound.   Review of Systems  Constitutional: Negative for fever and chills.  Respiratory: Negative for shortness of breath.   Cardiovascular: Positive for leg swelling. Negative for chest pain.  Musculoskeletal: Positive for myalgias.  Skin: Positive for color change and wound.       Objective:    BP 130/86  Pulse 70  Temp(Src) 98.2 F (36.8 C) (Oral)  Wt 147 lb (66.679 kg)  SpO2 98% Physical Exam  Skin: Ecchymosis noted. There is erythema.             Assessment & Plan:   Problem List Items Addressed This Visit   Open wound of left lower leg - Primary     Encouraged her to apply ice prn to contusion upper part of left lower leg. Will have her apply Telfa to skin tear left lower leg. Encouraged use of compression hose. She was given Rx for Keflex at [facility] which she will start if signs/symptoms of infection. Plan recheck wound in 1 week and prn.    Relevant Orders      Tdap vaccine greater than or equal to 7yo IM (Completed)       Return in about 1 week (around 09/20/2013) for Recheck Wound.

## 2013-09-20 ENCOUNTER — Encounter: Payer: Self-pay | Admitting: Internal Medicine

## 2013-09-20 DIAGNOSIS — E538 Deficiency of other specified B group vitamins: Secondary | ICD-10-CM | POA: Insufficient documentation

## 2013-09-21 DIAGNOSIS — K591 Functional diarrhea: Secondary | ICD-10-CM | POA: Diagnosis not present

## 2013-09-22 ENCOUNTER — Ambulatory Visit (INDEPENDENT_AMBULATORY_CARE_PROVIDER_SITE_OTHER): Payer: Medicare Other | Admitting: Internal Medicine

## 2013-09-22 ENCOUNTER — Encounter: Payer: Self-pay | Admitting: Internal Medicine

## 2013-09-22 VITALS — BP 112/68 | HR 71 | Temp 98.1°F | Resp 16 | Wt 146.5 lb

## 2013-09-22 DIAGNOSIS — S81009A Unspecified open wound, unspecified knee, initial encounter: Secondary | ICD-10-CM

## 2013-09-22 DIAGNOSIS — I679 Cerebrovascular disease, unspecified: Secondary | ICD-10-CM

## 2013-09-22 DIAGNOSIS — R42 Dizziness and giddiness: Secondary | ICD-10-CM | POA: Diagnosis not present

## 2013-09-22 DIAGNOSIS — S91009A Unspecified open wound, unspecified ankle, initial encounter: Secondary | ICD-10-CM | POA: Diagnosis not present

## 2013-09-22 DIAGNOSIS — S81802A Unspecified open wound, left lower leg, initial encounter: Secondary | ICD-10-CM

## 2013-09-22 DIAGNOSIS — S81809A Unspecified open wound, unspecified lower leg, initial encounter: Secondary | ICD-10-CM | POA: Diagnosis not present

## 2013-09-22 NOTE — Patient Instructions (Addendum)
Blue Diamond almond /coconut milk ,  Unsweetened ,  Has more calcium and vitamin D than regular milk   You can try metamucil to help bulk up your stools   Keep your wound covered and apply sterile vaseline with each dressing change   If the wound has not resolved  In 2 weeks,  Call for a wound clinic referral   Try Lactase before any meal containing dairy

## 2013-09-22 NOTE — Progress Notes (Signed)
Patient ID: Teresa Lin, female   DOB: 06/07/1931, 80 y.o.   MRN: [id number]   Patient Active Problem List   Diagnosis Date Noted  . B12 deficiency 09/20/2013  . Open wound of left lower leg 09/13/2013  . Other malaise and fatigue 05/23/2013  . Internal hemorrhoids without complication 16/05/930  . Rectal bleeding 04/20/2013  . Insomnia 03/03/2013  . Vertigo due to cerebrovascular disease 03/03/2013  . Rheumatoid arthritis 03/03/2013    Subjective:  CC:   Chief Complaint  Patient presents with  . Follow-up    recheck,HX  having dizzy spells, nauseated this morning with dizziness.    HPI:   Teresa Lin is a 80 y.o. female who presents for recheck of wound on Left lower leg that occurred from minor trauma May 4.  The wound was evaluated by Dr Gilford Rile  Evaluated two weeks ago and dressing changes were outlined.  Patient has been avoiding soaking it an has been using antibacterial ointment.  It is getting smaller. episode of dizziness this morning . True vertigo, accompanied  By nausea and a slight headache.  She has a history of vertigo which is chronic and episodic    Past Medical History  Diagnosis Date  . Arthritis   . Phlebitis and thrombophlebitis of deep veins of lower extremities     after surgery  . Colon polyps     Past Surgical History  Procedure Laterality Date  . Appendectomy    . Joint replacement Bilateral     knee replacement     The following portions of the patient's history were reviewed and updated as appropriate: Allergies, current medications, and problem list.    Review of Systems:   Patient denies headache, fevers, malaise, unintentional weight loss, skin rash, eye pain, sinus congestion and sinus pain, sore throat, dysphagia,  hemoptysis , cough, dyspnea, wheezing, chest pain, palpitations, orthopnea, edema, abdominal pain, nausea, melena, diarrhea, constipation, flank pain, dysuria, hematuria, urinary  Frequency, nocturia, numbness, tingling,  seizures,  Focal weakness, Loss of consciousness,  Tremor, insomnia, depression, anxiety, and suicidal ideation.     History   Social History  . Marital Status: Widowed    Spouse Name: N/A    Number of Children: N/A  . Years of Education: N/A   Occupational History  . Not on file.   Social History Main Topics  . Smoking status: Never Smoker   . Smokeless tobacco: Never Used  . Alcohol Use: 1.2 oz/week    2 Cans of beer per week     Comment: occasionally   . Drug Use: No  . Sexual Activity: No   Other Topics Concern  . Not on file   Social History Narrative  . No narrative on file    Objective:  Filed Vitals:   09/22/13 1540  BP: 112/68  Pulse: 71  Temp: 98.1 F (36.7 C)  Resp: 16     General appearance: alert, cooperative and appears stated age Ears: normal TM's and external ear canals both ears Throat: lips, mucosa, and tongue normal; teeth and gums normal Neck: no adenopathy, no carotid bruit, supple, symmetrical, trachea midline and thyroid not enlarged, symmetric, no tenderness/mass/nodules Back: symmetric, no curvature. ROM normal. No CVA tenderness. Lungs: clear to auscultation bilaterally Heart: regular rate and rhythm, S1, S2 normal, no murmur, click, rub or gallop Abdomen: soft, non-tender; bowel sounds normal; no masses,  no organomegaly Pulses: 2+ and symmetric Skin: Lower leg with quarter sized venous ulcer, granulating bed , minimal surrounding erythema  Lymph nodes: Cervical, supraclavicular, and axillary nodes normal.  Assessment and Plan:  Open wound of left lower leg  She was given Rx for Keflex at [facility] which she has not started bc wound is not infected.  encouragaed to elevate leg, keep wound clean and avoid oversaturation. If not resolved in 2 weeks.   Wound clinic referral advised,  Vertigo due to cerebrovascular disease Attributed to TIA's by Dr Erling Cruz by  prior neurology evaluation. Symptoms currently do not seem to be  associated with any neurologic change.    Updated Medication List Outpatient Encounter Prescriptions as of 09/22/2013  Medication Sig  . aspirin 81 MG tablet Take 81 mg by mouth daily.  . calcium-vitamin D (OSCAL WITH D) 500-200 MG-UNIT per tablet Take 1 tablet by mouth.  . folic acid (FOLVITE) 1 MG tablet Take 1 mg by mouth daily.  . hydroxychloroquine (PLAQUENIL) 200 MG tablet Take 200 mg by mouth daily.  . methotrexate (RHEUMATREX) 2.5 MG tablet Take 22.5 mg by mouth once a week.   . Multiple Vitamins-Minerals (MULTIVITAMIN WITH MINERALS) tablet Take 1 tablet by mouth daily.  Marland Kitchen omeprazole (PRILOSEC) 20 MG capsule Take 1 capsule (20 mg total) by mouth daily.  . predniSONE (DELTASONE) 5 MG tablet Take 5 mg by mouth daily.  Marland Kitchen docusate sodium (COLACE) 100 MG capsule Take 1 capsule (100 mg total) by mouth 2 (two) times daily.  . hydrocortisone (ANUSOL-HC) 25 MG suppository Place 1 suppository (25 mg total) rectally 2 (two) times daily.  . TDaP (BOOSTRIX) 5-2.5-18.5 LF-MCG/0.5 injection Inject 0.5 mLs into the muscle once.     No orders of the defined types were placed in this encounter.    No Follow-up on file.

## 2013-09-22 NOTE — Progress Notes (Signed)
Pre-visit discussion using our clinic review tool. No additional management support is needed unless otherwise documented below in the visit note.  

## 2013-09-25 ENCOUNTER — Encounter: Payer: Self-pay | Admitting: Internal Medicine

## 2013-09-25 NOTE — Assessment & Plan Note (Signed)
She was given Rx for Keflex at [facility] which she has not started bc wound is not infected.  encouragaed to elevate leg, keep wound clean and avoid oversaturation. If not resolved in 2 weeks.   Wound clinicn referra;

## 2013-09-25 NOTE — Assessment & Plan Note (Addendum)
Attributed to TIA's by Dr Erling Cruz by  prior neurology evaluation. Symptoms currently do not seem to be associated with any neurologic change.

## 2013-10-15 ENCOUNTER — Encounter (INDEPENDENT_AMBULATORY_CARE_PROVIDER_SITE_OTHER): Payer: Self-pay

## 2013-10-15 ENCOUNTER — Ambulatory Visit: Payer: Medicare Other | Admitting: *Deleted

## 2013-10-15 VITALS — BP 120/60 | HR 64 | Temp 97.9°F | Resp 16

## 2013-10-15 DIAGNOSIS — S81802A Unspecified open wound, left lower leg, initial encounter: Secondary | ICD-10-CM

## 2013-10-15 NOTE — Progress Notes (Unsigned)
Checked patient leg for wound care patient still has open wound advised patient to continue with prescribed care from MD. Not to get wet with water.

## 2013-11-03 DIAGNOSIS — L819 Disorder of pigmentation, unspecified: Secondary | ICD-10-CM | POA: Diagnosis not present

## 2013-11-03 DIAGNOSIS — L97909 Non-pressure chronic ulcer of unspecified part of unspecified lower leg with unspecified severity: Secondary | ICD-10-CM | POA: Diagnosis not present

## 2013-11-03 DIAGNOSIS — Z85828 Personal history of other malignant neoplasm of skin: Secondary | ICD-10-CM | POA: Diagnosis not present

## 2013-11-22 DIAGNOSIS — M064 Inflammatory polyarthropathy: Secondary | ICD-10-CM | POA: Diagnosis not present

## 2013-11-22 DIAGNOSIS — Z79899 Other long term (current) drug therapy: Secondary | ICD-10-CM | POA: Diagnosis not present

## 2013-11-25 DIAGNOSIS — M069 Rheumatoid arthritis, unspecified: Secondary | ICD-10-CM | POA: Diagnosis not present

## 2013-11-25 DIAGNOSIS — M79609 Pain in unspecified limb: Secondary | ICD-10-CM | POA: Diagnosis not present

## 2013-11-25 DIAGNOSIS — M19079 Primary osteoarthritis, unspecified ankle and foot: Secondary | ICD-10-CM | POA: Diagnosis not present

## 2013-12-21 DIAGNOSIS — H2640 Unspecified secondary cataract: Secondary | ICD-10-CM | POA: Diagnosis not present

## 2014-01-10 ENCOUNTER — Telehealth: Payer: Self-pay | Admitting: *Deleted

## 2014-01-10 ENCOUNTER — Ambulatory Visit (INDEPENDENT_AMBULATORY_CARE_PROVIDER_SITE_OTHER): Payer: Medicare Other | Admitting: Adult Health

## 2014-01-10 ENCOUNTER — Ambulatory Visit: Payer: Self-pay | Admitting: Internal Medicine

## 2014-01-10 ENCOUNTER — Ambulatory Visit (INDEPENDENT_AMBULATORY_CARE_PROVIDER_SITE_OTHER)
Admission: RE | Admit: 2014-01-10 | Discharge: 2014-01-10 | Disposition: A | Discharge status: Home / Self Care | Payer: Medicare Other | Source: Ambulatory Visit | Attending: Adult Health | Admitting: Adult Health

## 2014-01-10 ENCOUNTER — Other Ambulatory Visit: Payer: Self-pay | Admitting: Adult Health

## 2014-01-10 ENCOUNTER — Encounter: Payer: Self-pay | Admitting: Adult Health

## 2014-01-10 ENCOUNTER — Encounter: Payer: Self-pay | Admitting: *Deleted

## 2014-01-10 VITALS — BP 108/56 | HR 81 | Temp 98.0°F | Resp 14 | Ht 65.5 in | Wt 143.5 lb

## 2014-01-10 DIAGNOSIS — R042 Hemoptysis: Secondary | ICD-10-CM

## 2014-01-10 DIAGNOSIS — R791 Abnormal coagulation profile: Secondary | ICD-10-CM | POA: Diagnosis not present

## 2014-01-10 DIAGNOSIS — I517 Cardiomegaly: Secondary | ICD-10-CM | POA: Diagnosis not present

## 2014-01-10 LAB — CBC WITH DIFFERENTIAL/PLATELET
Basophils Absolute: 0 10*3/uL (ref 0.0–0.1)
Basophils Relative: 0.2 % (ref 0.0–3.0)
Eosinophils Absolute: 0.1 10*3/uL (ref 0.0–0.7)
Eosinophils Relative: 1.1 % (ref 0.0–5.0)
HCT: 40.8 % (ref 36.0–46.0)
Hemoglobin: 13.7 g/dL (ref 12.0–15.0)
Lymphocytes Relative: 17.2 % (ref 12.0–46.0)
Lymphs Abs: 1.1 10*3/uL (ref 0.7–4.0)
MCHC: 33.6 g/dL (ref 30.0–36.0)
MCV: 96.4 fl (ref 78.0–100.0)
Monocytes Absolute: 0.6 10*3/uL (ref 0.1–1.0)
Monocytes Relative: 8.9 % (ref 3.0–12.0)
Neutro Abs: 4.7 10*3/uL (ref 1.4–7.7)
Neutrophils Relative %: 72.6 % (ref 43.0–77.0)
Platelets: 325 10*3/uL (ref 150.0–400.0)
RBC: 4.23 Mil/uL (ref 3.87–5.11)
RDW: 14.4 % (ref 11.5–15.5)
WBC: 6.5 10*3/uL (ref 4.0–10.5)

## 2014-01-10 LAB — D-DIMER, QUANTITATIVE: D-Dimer, Quant: 1.09 ug/mL-FEU — ABNORMAL HIGH (ref 0.00–0.48)

## 2014-01-10 NOTE — Progress Notes (Signed)
D dimer elevated. Needs CT scan chest - PE protocol

## 2014-01-10 NOTE — Patient Instructions (Signed)
  Please have labs drawn prior to leaving the office.  Please go to the [facility] office for chest x-ray.  Return to clinic on Thursday to have TB skin test read  Return for follow up appointment with your Dr. Derrel Nip in 1 week

## 2014-01-10 NOTE — Progress Notes (Signed)
Pre visit review using our clinic review tool, if applicable. No additional management support is needed unless otherwise documented below in the visit note. 

## 2014-01-10 NOTE — Progress Notes (Signed)
Patient ID: Teresa Lin, female   DOB: [birth year], 80 y.o.   MRN: [id number]   Subjective:    Patient ID: Teresa Lin, female    DOB: 1931/09/09, 80 y.o.   MRN: [id number]  HPI  Reports spitting up blood. Mainly with clearing throat. Occurred twice. First time last week then again this morning. Occurs with getting up phlegm (clearing throat). No cough. Denies sick contacts or feeling ill. Never smoked. Husband was a smoker. Feeling tired but reports "she goes very hard". No shortness of breath or pain with inspiration. No night sweats reports but has had some weight loss. Denies drinking any red colored drinks that may be giving sputum/phlegm this color.  Pt is on immunosupressive therapy for her RA. Take methotrexate and plaquenil prescribed by her Rheumatologist. Occasionally takes prednisone to help with a flare.   Past Medical History  Diagnosis Date  . Arthritis   . Phlebitis and thrombophlebitis of deep veins of lower extremities     after surgery  . Colon polyps     Current Outpatient Prescriptions on File Prior to Visit  Medication Sig Dispense Refill  . aspirin 81 MG tablet Take 81 mg by mouth daily.      . calcium-vitamin D (OSCAL WITH D) 500-200 MG-UNIT per tablet Take 1 tablet by mouth.      . folic acid (FOLVITE) 1 MG tablet Take 1 mg by mouth daily.      . hydroxychloroquine (PLAQUENIL) 200 MG tablet Take 200 mg by mouth daily.      . methotrexate (RHEUMATREX) 2.5 MG tablet Take 22.5 mg by mouth once a week.       . Multiple Vitamins-Minerals (MULTIVITAMIN WITH MINERALS) tablet Take 1 tablet by mouth daily.      . predniSONE (DELTASONE) 5 MG tablet Take 5 mg by mouth daily. Only uses when RA flair, taper       No current facility-administered medications on file prior to visit.     Review of Systems  Constitutional: Positive for fatigue and unexpected weight change (reports weight loss). Negative for appetite change.  HENT: Negative for sore throat.        No upper  respiratory symptoms  Respiratory: Negative for cough and shortness of breath.   Cardiovascular: Negative for chest pain.  Musculoskeletal:       Takes methotrexate and plaquenil for RA  Neurological: Positive for dizziness (hx of. Reports taking OTC bonine).  Psychiatric/Behavioral: Negative.        Objective:  BP 108/56  Pulse 81  Temp(Src) 98 F (36.7 C) (Oral)  Resp 14  Ht 5' 5.5" (1.664 m)  Wt 143 lb 8 oz (65.091 kg)  BMI 23.51 kg/m2  SpO2 97%   Physical Exam  Constitutional: She is oriented to person, place, and time. She appears well-developed and well-nourished. No distress.  HENT:  Head: Normocephalic and atraumatic.  Mouth/Throat: Oropharynx is clear and moist.  Eyes: Conjunctivae and EOM are normal.  Neck: Normal range of motion. Neck supple.  Cardiovascular: Normal rate, regular rhythm, normal heart sounds and intact distal pulses.  Exam reveals no gallop and no friction rub.   No murmur heard. Pulmonary/Chest: Effort normal and breath sounds normal. No respiratory distress. She has no wheezes. She has no rales.  Musculoskeletal: Normal range of motion.  Neurological: She is alert and oriented to person, place, and time. She has normal reflexes. Coordination normal.  Skin: Skin is warm and dry.  Psychiatric: She has a normal  mood and affect. Her behavior is normal. Judgment and thought content normal.      Assessment & Plan:   1. Hemoptysis Not presently sick or has she been sick recently. Not coughing that would potentially irritate and cause some blood-tinged sputum. Weight loss and on immunosuppressive tx for RA. Will obtain PPD and have her return on Thursday for read. Also, concern about possible PE; however, does not present with symptoms. No sob, pain. Will obtain D-dimer. Send for chest xray. Many years of exposure to second hand smoke. Follow up with her PCP in 1 week. Pt may need referral to Pulmonology if persisting symptoms. Consider chest CT - CBC  with Differential - D-dimer, quantitative - DG Chest 2 View; Future

## 2014-01-10 NOTE — Telephone Encounter (Signed)
Encounter opened in error

## 2014-01-10 NOTE — Addendum Note (Signed)
Addended by: Wynonia Lawman E on: 01/10/2014 09:18 AM   Modules accepted: Orders

## 2014-01-10 NOTE — Telephone Encounter (Signed)
Lab reviewed by Raquel. Left message for pt to return my call on both contact numbers, needs to have CT per Raquel.

## 2014-01-10 NOTE — Telephone Encounter (Signed)
representative from [facility] lab called to report pts D-Dimer as 1.09.  Result printed and handed to Raquel for review.

## 2014-01-24 ENCOUNTER — Ambulatory Visit (INDEPENDENT_AMBULATORY_CARE_PROVIDER_SITE_OTHER): Payer: Medicare Other | Admitting: Internal Medicine

## 2014-01-24 ENCOUNTER — Encounter: Payer: Self-pay | Admitting: Internal Medicine

## 2014-01-24 VITALS — BP 120/74 | HR 81 | Temp 98.0°F | Resp 16 | Ht 65.5 in | Wt 144.2 lb

## 2014-01-24 DIAGNOSIS — S81009A Unspecified open wound, unspecified knee, initial encounter: Secondary | ICD-10-CM

## 2014-01-24 DIAGNOSIS — R42 Dizziness and giddiness: Secondary | ICD-10-CM | POA: Diagnosis not present

## 2014-01-24 DIAGNOSIS — R042 Hemoptysis: Secondary | ICD-10-CM

## 2014-01-24 DIAGNOSIS — I679 Cerebrovascular disease, unspecified: Secondary | ICD-10-CM

## 2014-01-24 DIAGNOSIS — Z1239 Encounter for other screening for malignant neoplasm of breast: Secondary | ICD-10-CM

## 2014-01-24 DIAGNOSIS — S91009A Unspecified open wound, unspecified ankle, initial encounter: Secondary | ICD-10-CM

## 2014-01-24 DIAGNOSIS — S81802D Unspecified open wound, left lower leg, subsequent encounter: Secondary | ICD-10-CM

## 2014-01-24 DIAGNOSIS — S81809A Unspecified open wound, unspecified lower leg, initial encounter: Secondary | ICD-10-CM

## 2014-01-24 DIAGNOSIS — Z5189 Encounter for other specified aftercare: Secondary | ICD-10-CM

## 2014-01-24 NOTE — Progress Notes (Signed)
Pre-visit discussion using our clinic review tool. No additional management support is needed unless otherwise documented below in the visit note.  

## 2014-01-24 NOTE — Progress Notes (Signed)
Patient ID: Teresa Lin, female   DOB: 1931/11/08, 80 y.o.   MRN: [id number]    Patient Active Problem List   Diagnosis Date Noted  . Hemoptysis 01/10/2014  . B12 deficiency 09/20/2013  . Open wound of left lower leg 09/13/2013  . Other malaise and fatigue 05/23/2013  . Internal hemorrhoids without complication 54/27/0623  . Insomnia 03/03/2013  . Vertigo due to cerebrovascular disease 03/03/2013  . Rheumatoid arthritis 03/03/2013    Subjective:  CC:   Chief Complaint  Patient presents with  . Follow-up    1 week for hemoptysis    HPI:   Teresa Lin is a 80 y.o. female who presents for Follow up on hemoptysis.  She had 2 limited episodes of hemoptysis two weeks ago  Ct negative for PE ordered by Raquel Rey.    Cardiomegaly was noted .  She has no symptoms of heart failure or cardiomyopathy.  Gets tired easily but denies dyspnea and orthopnea and walks a mile daily with no issues  Takes MTX m  Has multiple  Questions about its effects     Past Medical History  Diagnosis Date  . Arthritis   . Phlebitis and thrombophlebitis of deep veins of lower extremities     after surgery  . Colon polyps     Past Surgical History  Procedure Laterality Date  . Appendectomy    . Joint replacement Bilateral     knee replacement       The following portions of the patient's history were reviewed and updated as appropriate: Allergies, current medications, and problem list.    Review of Systems:   Patient denies headache, fevers, malaise, unintentional weight loss, skin rash, eye pain, sinus congestion and sinus pain, sore throat, dysphagia,  hemoptysis , cough, dyspnea, wheezing, chest pain, palpitations, orthopnea, edema, abdominal pain, nausea, melena, diarrhea, constipation, flank pain, dysuria, hematuria, urinary  Frequency, nocturia, numbness, tingling, seizures,  Focal weakness, Loss of consciousness,  Tremor, insomnia, depression, anxiety, and suicidal ideation.      History   Social History  . Marital Status: Widowed    Spouse Name: N/A    Number of Children: N/A  . Years of Education: N/A   Occupational History  . Not on file.   Social History Main Topics  . Smoking status: Never Smoker   . Smokeless tobacco: Never Used  . Alcohol Use: 1.2 oz/week    2 Cans of beer per week     Comment: occasionally   . Drug Use: No  . Sexual Activity: No   Other Topics Concern  . Not on file   Social History Narrative  . No narrative on file    Objective:  Filed Vitals:   01/24/14 1131  BP: 120/74  Pulse: 81  Temp: 98 F (36.7 C)  Resp: 16     General appearance: alert, cooperative and appears stated age Ears: normal TM's and external ear canals both ears Throat: lips, mucosa, and tongue normal; teeth and gums normal Neck: no adenopathy, no carotid bruit, supple, symmetrical, trachea midline and thyroid not enlarged, symmetric, no tenderness/mass/nodules Back: symmetric, no curvature. ROM normal. No CVA tenderness. Lungs: clear to auscultation bilaterally Heart: regular rate and rhythm, S1, S2 normal, no murmur, click, rub or gallop Abdomen: soft, non-tender; bowel sounds normal; no masses,  no organomegaly Pulses: 2+ and symmetric Skin: Skin color, texture, turgor normal. No rashes or lesions Lymph nodes: Cervical, supraclavicular, and axillary nodes normal.  Assessment and Plan:  Open wound of left lower leg Now completely resolved withoutcomplications  Hemoptysis Resolved, with CT negative for PE.  Source was likely sinuses given history of snoring .  No further workup at this time unless recurrent.,    Updated Medication List Outpatient Encounter Prescriptions as of 01/24/2014  Medication Sig  . aspirin 81 MG tablet Take 81 mg by mouth daily.  . calcium-vitamin D (OSCAL WITH D) 500-200 MG-UNIT per tablet Take 1 tablet by mouth.  . folic acid (FOLVITE) 1 MG tablet Take 2 mg by mouth daily.   . hydroxychloroquine  (PLAQUENIL) 200 MG tablet Take 200 mg by mouth daily.  . methotrexate (RHEUMATREX) 2.5 MG tablet Take 22.5 mg by mouth once a week.   . Multiple Vitamins-Minerals (MULTIVITAMIN WITH MINERALS) tablet Take 1 tablet by mouth daily.  . predniSONE (DELTASONE) 5 MG tablet Take 5 mg by mouth daily. Only uses when RA flair, taper     Orders Placed This Encounter  Procedures  . MM DIGITAL SCREENING BILATERAL    No Follow-up on file.

## 2014-01-25 NOTE — Assessment & Plan Note (Signed)
Now completely resolved withoutcomplications

## 2014-01-25 NOTE — Assessment & Plan Note (Signed)
Resolved, with CT negative for PE.  Source was likely sinuses given history of snoring .  No further workup at this time unless recurrent.,

## 2014-02-02 ENCOUNTER — Telehealth: Payer: Self-pay | Admitting: *Deleted

## 2014-02-02 DIAGNOSIS — E785 Hyperlipidemia, unspecified: Secondary | ICD-10-CM

## 2014-02-02 DIAGNOSIS — R5383 Other fatigue: Principal | ICD-10-CM

## 2014-02-02 DIAGNOSIS — Z79899 Other long term (current) drug therapy: Secondary | ICD-10-CM

## 2014-02-02 DIAGNOSIS — R5381 Other malaise: Secondary | ICD-10-CM

## 2014-02-02 NOTE — Telephone Encounter (Signed)
Pt is coming in tomorrow what labs and dx?  

## 2014-02-03 ENCOUNTER — Other Ambulatory Visit (INDEPENDENT_AMBULATORY_CARE_PROVIDER_SITE_OTHER): Payer: Medicare Other

## 2014-02-03 DIAGNOSIS — Z79899 Other long term (current) drug therapy: Secondary | ICD-10-CM | POA: Diagnosis not present

## 2014-02-03 DIAGNOSIS — R5381 Other malaise: Secondary | ICD-10-CM | POA: Diagnosis not present

## 2014-02-03 DIAGNOSIS — E785 Hyperlipidemia, unspecified: Secondary | ICD-10-CM

## 2014-02-03 DIAGNOSIS — R5383 Other fatigue: Secondary | ICD-10-CM

## 2014-02-03 LAB — CBC WITH DIFFERENTIAL/PLATELET
Basophils Absolute: 0 10*3/uL (ref 0.0–0.1)
Basophils Relative: 0.2 % (ref 0.0–3.0)
Eosinophils Absolute: 0.1 10*3/uL (ref 0.0–0.7)
Eosinophils Relative: 2.3 % (ref 0.0–5.0)
HCT: 41.6 % (ref 36.0–46.0)
Hemoglobin: 13.7 g/dL (ref 12.0–15.0)
Lymphocytes Relative: 24.9 % (ref 12.0–46.0)
Lymphs Abs: 1.3 10*3/uL (ref 0.7–4.0)
MCHC: 33 g/dL (ref 30.0–36.0)
MCV: 98 fl (ref 78.0–100.0)
Monocytes Absolute: 0.4 10*3/uL (ref 0.1–1.0)
Monocytes Relative: 8.5 % (ref 3.0–12.0)
Neutro Abs: 3.3 10*3/uL (ref 1.4–7.7)
Neutrophils Relative %: 64.1 % (ref 43.0–77.0)
Platelets: 297 10*3/uL (ref 150.0–400.0)
RBC: 4.24 Mil/uL (ref 3.87–5.11)
RDW: 14.7 % (ref 11.5–15.5)
WBC: 5.1 10*3/uL (ref 4.0–10.5)

## 2014-02-03 LAB — TSH: TSH: 0.79 u[IU]/mL (ref 0.35–4.50)

## 2014-02-03 LAB — COMPREHENSIVE METABOLIC PANEL
ALT: 16 U/L (ref 0–35)
AST: 27 U/L (ref 0–37)
Albumin: 4.2 g/dL (ref 3.5–5.2)
Alkaline Phosphatase: 52 U/L (ref 39–117)
BUN: 14 mg/dL (ref 6–23)
CO2: 30 mEq/L (ref 19–32)
Calcium: 8.9 mg/dL (ref 8.4–10.5)
Chloride: 105 mEq/L (ref 96–112)
Creatinine, Ser: 0.7 mg/dL (ref 0.4–1.2)
GFR: 93.19 mL/min (ref >60.00)
Glucose, Bld: 89 mg/dL (ref 70–99)
Potassium: 4 mEq/L (ref 3.5–5.1)
Sodium: 141 mEq/L (ref 135–145)
Total Bilirubin: 0.6 mg/dL (ref 0.2–1.2)
Total Protein: 7.2 g/dL (ref 6.0–8.3)

## 2014-02-03 LAB — LIPID PANEL
Cholesterol: 182 mg/dL (ref 0–200)
HDL: 67 mg/dL (ref >39.00)
LDL Cholesterol: 102 mg/dL — ABNORMAL HIGH (ref 0–99)
NonHDL: 115
Total CHOL/HDL Ratio: 3
Triglycerides: 65 mg/dL (ref 0.0–149.0)
VLDL: 13 mg/dL (ref 0.0–40.0)

## 2014-02-04 ENCOUNTER — Encounter: Payer: Self-pay | Admitting: Internal Medicine

## 2014-02-04 ENCOUNTER — Other Ambulatory Visit: Payer: Medicare Other

## 2014-02-07 ENCOUNTER — Encounter: Payer: Self-pay | Admitting: *Deleted

## 2014-02-10 ENCOUNTER — Ambulatory Visit: Payer: Medicare Other | Admitting: Internal Medicine

## 2014-02-10 DIAGNOSIS — J069 Acute upper respiratory infection, unspecified: Secondary | ICD-10-CM | POA: Diagnosis not present

## 2014-02-18 ENCOUNTER — Ambulatory Visit: Payer: PRIVATE HEALTH INSURANCE | Admitting: Internal Medicine

## 2014-03-01 DIAGNOSIS — M069 Rheumatoid arthritis, unspecified: Secondary | ICD-10-CM | POA: Diagnosis not present

## 2014-04-01 ENCOUNTER — Telehealth: Payer: Self-pay | Admitting: Internal Medicine

## 2014-04-01 NOTE — Telephone Encounter (Signed)
Called patient, no answer. Left voicemail requesting a callback.

## 2014-04-01 NOTE — Telephone Encounter (Signed)
[name] stopped by. She said at her last appt Dr. Derrel Nip mentioned something about her needing a heart valve replacement and she'd like to discuss that with the doctor. Also, within the past week, she's fallen and she said her ankles are heated and sensitive. She'd like to be seen for an appt for all these reasons but especially to discuss the possible need of a heart valve replacement. Please call the patient. Pt ph# [phone] Thank you.

## 2014-04-01 NOTE — Telephone Encounter (Signed)
Is it ok to schedule patient for next available 33min appt? Please advise

## 2014-04-01 NOTE — Telephone Encounter (Signed)
1) I have reviewed her chart and find no reference to any heart disease or  Discussion or indication for a heart  valve replacement.  She is mistaken.  2) She has a rheumatologist,  If both ankles are warm and tender she really needs to see dr . Jefm Bryant.  Has she already tried to see him?  If so,  15 min slot ok

## 2014-04-11 NOTE — Telephone Encounter (Signed)
Second attempt to contact patient, no answer. Left voicemail requesting a callback.

## 2014-04-21 NOTE — Telephone Encounter (Signed)
Left message to call office

## 2014-04-21 NOTE — Telephone Encounter (Signed)
The patient came into the office today stating that she received a voice message from this office and she wanted to speak with someone about her heart valve.

## 2014-04-26 NOTE — Telephone Encounter (Signed)
Left message to return call to office.

## 2014-05-16 ENCOUNTER — Ambulatory Visit (INDEPENDENT_AMBULATORY_CARE_PROVIDER_SITE_OTHER): Payer: Medicare Other | Admitting: Internal Medicine

## 2014-05-16 ENCOUNTER — Encounter: Payer: Self-pay | Admitting: Internal Medicine

## 2014-05-16 VITALS — BP 118/66 | HR 73 | Temp 97.7°F | Resp 14 | Ht 66.0 in | Wt 142.8 lb

## 2014-05-16 DIAGNOSIS — R5383 Other fatigue: Secondary | ICD-10-CM

## 2014-05-16 DIAGNOSIS — E538 Deficiency of other specified B group vitamins: Secondary | ICD-10-CM

## 2014-05-16 NOTE — Progress Notes (Signed)
Pre-visit discussion using our clinic review tool. No additional management support is needed unless otherwise documented below in the visit note.  

## 2014-05-16 NOTE — Progress Notes (Signed)
Patient ID: Teresa Lin, female   DOB: 1932/04/10, 81 y.o.   MRN: [id number]   Patient Active Problem List   Diagnosis Date Noted  . B12 deficiency 09/20/2013  . Fatigue 05/23/2013  . Internal hemorrhoids without complication 93/90/3009  . Insomnia 03/03/2013  . Vertigo due to cerebrovascular disease 03/03/2013  . Rheumatoid arthritis 03/03/2013    Subjective:  CC:   Chief Complaint  Patient presents with  . Acute Visit    weakness and fatigue    HPI:   Teresa Lin is a 81 y.o. female who presents for Follow up on CT scan of chest done several months ago during workup for hemoptysis.,  Patient has ben feeling weak and fatigued and concerned that the CT suggested that she had a heart problem. Her sympotms hae since then resolved,  And she denies dyspnea with exertion,  Supine position and at rest,  No lower extremity edema,  No chest pain.    Past Medical History  Diagnosis Date  . Arthritis   . Phlebitis and thrombophlebitis of deep veins of lower extremities     after surgery  . Colon polyps     Past Surgical History  Procedure Laterality Date  . Appendectomy    . Joint replacement Bilateral     knee replacement       The following portions of the patient's history were reviewed and updated as appropriate: Allergies, current medications, and problem list.    Review of Systems:   Patient denies headache, fevers, malaise, unintentional weight loss, skin rash, eye pain, sinus congestion and sinus pain, sore throat, dysphagia,  hemoptysis , cough, dyspnea, wheezing, chest pain, palpitations, orthopnea, edema, abdominal pain, nausea, melena, diarrhea, constipation, flank pain, dysuria, hematuria, urinary  Frequency, nocturia, numbness, tingling, seizures,  Focal weakness, Loss of consciousness,  Tremor, insomnia, depression, anxiety, and suicidal ideation.     History   Social History  . Marital Status: Widowed    Spouse Name: N/A    Number of Children: N/A  .  Years of Education: N/A   Occupational History  . Not on file.   Social History Main Topics  . Smoking status: Never Smoker   . Smokeless tobacco: Never Used  . Alcohol Use: 1.2 oz/week    2 Cans of beer per week     Comment: occasionally   . Drug Use: No  . Sexual Activity: No   Other Topics Concern  . Not on file   Social History Narrative    Objective:  Filed Vitals:   05/16/14 1753  BP: 118/66  Pulse: 73  Temp: 97.7 F (36.5 C)  Resp: 14     General appearance: alert, cooperative and appears stated age Ears: normal TM's and external ear canals both ears Throat: lips, mucosa, and tongue normal; teeth and gums normal Neck: no adenopathy, no carotid bruit, supple, symmetrical, trachea midline and thyroid not enlarged, symmetric, no tenderness/mass/nodules Back: symmetric, no curvature. ROM normal. No CVA tenderness. Lungs: clear to auscultation bilaterally Heart: regular rate and rhythm, S1, S2 normal, no murmur, click, rub or gallop Abdomen: soft, non-tender; bowel sounds normal; no masses,  no organomegaly Pulses: 2+ and symmetric Skin: Skin color, texture, turgor normal. No rashes or lesions Lymph nodes: Cervical, supraclavicular, and axillary nodes normal.  Assessment and Plan:  Fatigue Ct reviewed, labs reviewee,  Exam normal,  Reassurance provided,   B12 deficiency Lab Results  Component Value Date   VITAMINB12 389 05/21/2013   Last level was  normal,  Continue dietary supplements   Updated Medication List Outpatient Encounter Prescriptions as of 05/16/2014  Medication Sig  . aspirin 81 MG tablet Take 81 mg by mouth daily.  . calcium-vitamin D (OSCAL WITH D) 500-200 MG-UNIT per tablet Take 1 tablet by mouth.  . folic acid (FOLVITE) 1 MG tablet Take 2 mg by mouth daily.   . hydroxychloroquine (PLAQUENIL) 200 MG tablet Take 200 mg by mouth daily.  . methotrexate (RHEUMATREX) 2.5 MG tablet Take 22.5 mg by mouth once a week.   . Multiple  Vitamins-Minerals (MULTIVITAMIN WITH MINERALS) tablet Take 1 tablet by mouth daily.  . predniSONE (DELTASONE) 5 MG tablet Take 5 mg by mouth daily. Only uses when RA flair, taper     No orders of the defined types were placed in this encounter.    No Follow-up on file.

## 2014-05-16 NOTE — Patient Instructions (Signed)
You can use generic Pepcid (famotidine) 20 mg as needed for reflux  call me if you develop chest pain or severe shortness of breath during exercise

## 2014-05-18 NOTE — Assessment & Plan Note (Addendum)
Lab Results  Component Value Date   [id number] 729 05/21/2013   Last level was normal,  Continue dietary supplements

## 2014-05-18 NOTE — Assessment & Plan Note (Signed)
Ct reviewed, labs reviewee,  Exam normal,  Reassurance provided,

## 2014-05-19 ENCOUNTER — Ambulatory Visit: Payer: Self-pay | Admitting: Internal Medicine

## 2014-05-19 DIAGNOSIS — Z1231 Encounter for screening mammogram for malignant neoplasm of breast: Secondary | ICD-10-CM | POA: Diagnosis not present

## 2014-05-19 LAB — HM MAMMOGRAPHY: HM Mammogram: NEGATIVE

## 2014-05-20 ENCOUNTER — Encounter: Payer: Self-pay | Admitting: Internal Medicine

## 2014-06-09 ENCOUNTER — Ambulatory Visit (INDEPENDENT_AMBULATORY_CARE_PROVIDER_SITE_OTHER): Payer: Medicare Other | Admitting: Internal Medicine

## 2014-06-09 ENCOUNTER — Encounter: Payer: Self-pay | Admitting: Internal Medicine

## 2014-06-09 VITALS — BP 120/60 | HR 79 | Temp 97.6°F | Resp 14 | Ht 66.0 in | Wt 142.5 lb

## 2014-06-09 DIAGNOSIS — Z79899 Other long term (current) drug therapy: Secondary | ICD-10-CM

## 2014-06-09 DIAGNOSIS — S86912A Strain of unspecified muscle(s) and tendon(s) at lower leg level, left leg, initial encounter: Secondary | ICD-10-CM

## 2014-06-09 DIAGNOSIS — R198 Other specified symptoms and signs involving the digestive system and abdomen: Secondary | ICD-10-CM

## 2014-06-09 DIAGNOSIS — J069 Acute upper respiratory infection, unspecified: Secondary | ICD-10-CM

## 2014-06-09 DIAGNOSIS — M26609 Unspecified temporomandibular joint disorder, unspecified side: Secondary | ICD-10-CM

## 2014-06-09 DIAGNOSIS — J029 Acute pharyngitis, unspecified: Secondary | ICD-10-CM | POA: Diagnosis not present

## 2014-06-09 DIAGNOSIS — M2660 Temporomandibular joint disorder, unspecified: Secondary | ICD-10-CM

## 2014-06-09 DIAGNOSIS — B9789 Other viral agents as the cause of diseases classified elsewhere: Secondary | ICD-10-CM

## 2014-06-09 DIAGNOSIS — I8393 Asymptomatic varicose veins of bilateral lower extremities: Secondary | ICD-10-CM

## 2014-06-09 DIAGNOSIS — S86812A Strain of other muscle(s) and tendon(s) at lower leg level, left leg, initial encounter: Secondary | ICD-10-CM | POA: Diagnosis not present

## 2014-06-09 LAB — COMPREHENSIVE METABOLIC PANEL
ALT: 13 U/L (ref 0–35)
AST: 24 U/L (ref 0–37)
Albumin: 4.2 g/dL (ref 3.5–5.2)
Alkaline Phosphatase: 55 U/L (ref 39–117)
BUN: 12 mg/dL (ref 6–23)
CO2: 29 mEq/L (ref 19–32)
Calcium: 9.2 mg/dL (ref 8.4–10.5)
Chloride: 103 mEq/L (ref 96–112)
Creatinine, Ser: 0.59 mg/dL (ref 0.40–1.20)
GFR: 104.12 mL/min (ref >60.00)
Glucose, Bld: 75 mg/dL (ref 70–99)
Potassium: 4.6 mEq/L (ref 3.5–5.1)
Sodium: 139 mEq/L (ref 135–145)
Total Bilirubin: 0.3 mg/dL (ref 0.2–1.2)
Total Protein: 6.8 g/dL (ref 6.0–8.3)

## 2014-06-09 LAB — CBC WITH DIFFERENTIAL/PLATELET
Basophils Absolute: 0 10*3/uL (ref 0.0–0.1)
Basophils Relative: 0.3 % (ref 0.0–3.0)
Eosinophils Absolute: 0 10*3/uL (ref 0.0–0.7)
Eosinophils Relative: 0.9 % (ref 0.0–5.0)
HCT: 42.5 % (ref 36.0–46.0)
Hemoglobin: 14.2 g/dL (ref 12.0–15.0)
Lymphocytes Relative: 18.8 % (ref 12.0–46.0)
Lymphs Abs: 1 10*3/uL (ref 0.7–4.0)
MCHC: 33.4 g/dL (ref 30.0–36.0)
MCV: 95.5 fl (ref 78.0–100.0)
Monocytes Absolute: 0.5 10*3/uL (ref 0.1–1.0)
Monocytes Relative: 9.1 % (ref 3.0–12.0)
Neutro Abs: 3.8 10*3/uL (ref 1.4–7.7)
Neutrophils Relative %: 70.9 % (ref 43.0–77.0)
Platelets: 283 10*3/uL (ref 150.0–400.0)
RBC: 4.45 Mil/uL (ref 3.87–5.11)
RDW: 14.2 % (ref 11.5–15.5)
WBC: 5.4 10*3/uL (ref 4.0–10.5)

## 2014-06-09 LAB — POCT RAPID STREP A (OFFICE): Rapid Strep A Screen: NEGATIVE

## 2014-06-09 NOTE — Progress Notes (Signed)
Pre visit review using our clinic review tool, if applicable. No additional management support is needed unless otherwise documented below in the visit note. 

## 2014-06-09 NOTE — Progress Notes (Signed)
Patient ID: Teresa Lin, female   DOB: 02-09-1932, 81 y.o.   MRN: [id number]   Patient Active Problem List   Diagnosis Date Noted  . Viral URI with cough 06/12/2014  . Muscle strain of knee 06/12/2014  . Tenesmus (rectal) 06/12/2014  . B12 deficiency 09/20/2013  . Fatigue 05/23/2013  . Internal hemorrhoids without complication 73/22/0254  . Insomnia 03/03/2013  . Vertigo due to cerebrovascular disease 03/03/2013  . Rheumatoid arthritis 03/03/2013    Subjective:  CC:   Chief Complaint  Patient presents with  . Cough    sore throat, sinusitis, cough, started monday. Denies fever    HPI:   Teresa Lin is a 81 y.o. female who presents for her annual wellness exam, but this has been  postponed due to her bringing a list of several acute issues.    1) Left knee pain,  Started an exercise program in early January,  Then went to the beach for a weekend and walked in the sand one day for about 30 minutes wearing tennis shoes. .   Had been walking on the treadmill prior to this.   Developed left knee pain lateral and medial,  Fells "gimpy" (explained that gimpy means weak, wants to give way, trouble straightening out leg for first few days),  No redness or swelling  History bilateral knee replacements 2006 ,  Has appt with [name] doctor in April  2) Having some bilateral lower leg pain and swelling .redness at night,  Has varicose veins and does not wear stockings at this time  3)  Jaw pain on the right side, has always popped,  Bothers her at night ,  Hard  to open mouth to eat dinner.  Started 3 days ago.   4) Has recurrent bilateral glank pain,  Under lowest rib. brought on by getting out of  bath tub and getting out of bed, worried she has bilateral hernias   5) viral URI started Monday with sore throat,  Feels lousy,  Nasal drainiage is copius but clear.  depep bronchial cough,  No fevers , chest pain dyspne or nausea  6) recurrent epiodes of loose stools with tenesmus ,  occurs  once a week after MTX dose.         Past Medical History  Diagnosis Date  . Arthritis   . Phlebitis and thrombophlebitis of deep veins of lower extremities     after surgery  . Colon polyps     Past Surgical History  Procedure Laterality Date  . Appendectomy    . Joint replacement Bilateral     knee replacement       The following portions of the patient's history were reviewed and updated as appropriate: Allergies, current medications, and problem list.    Review of Systems:   Patient denies headache, fevers, malaise, unintentional weight loss, skin rash, eye pain, sinus congestion and sinus pain, sore throat, dysphagia,  hemoptysis , cough, dyspnea, wheezing, chest pain, palpitations, orthopnea, edema, abdominal pain, nausea, melena, diarrhea, constipation, flank pain, dysuria, hematuria, urinary  Frequency, nocturia, numbness, tingling, seizures,  Focal weakness, Loss of consciousness,  Tremor, insomnia, depression, anxiety, and suicidal ideation.     History   Social History  . Marital Status: Widowed    Spouse Name: N/A    Number of Children: N/A  . Years of Education: N/A   Occupational History  . Not on file.   Social History Main Topics  . Smoking status: Never Smoker   .  Smokeless tobacco: Never Used  . Alcohol Use: 1.2 oz/week    2 Cans of beer per week     Comment: occasionally   . Drug Use: No  . Sexual Activity: No   Other Topics Concern  . Not on file   Social History Narrative    Objective:  Filed Vitals:   06/09/14 0852  BP: 120/60  Pulse: 79  Temp: 97.6 F (36.4 C)  Resp: 14     General appearance: alert, cooperative and appears stated age Ears: normal TM's and external ear canals both ears Throat: lips, mucosa, and tongue normal; teeth and gums normal Neck: no adenopathy, no carotid bruit, supple, symmetrical, trachea midline and thyroid not enlarged, symmetric, no tenderness/mass/nodules Back: symmetric, no curvature. ROM  normal. No CVA tenderness. Lungs: clear to auscultation bilaterally Heart: regular rate and rhythm, S1, S2 normal, no murmur, click, rub or gallop Abdomen: soft, non-tender; bowel sounds normal; no masses,  no organomegaly Pulses: 2+ and symmetric Skin: Skin color, texture, turgor normal. No rashes or lesions Lymph nodes: Cervical, supraclavicular, and axillary nodes normal.  Assessment and Plan:  Problem List Items Addressed This Visit    Muscle strain of knee    Secondary to recent beach walking.  Reassurance provided.       Tenesmus (rectal)    Her episodes occur once a week after taking her methotrexate dose. Reassurance provided .  Recommended trial of probiotics.       TMJ (temporomandibular joint syndrome)    She has popping and tenderness of her right TM joint.  Supportive care outlined.       Varicose veins of both lower extremities without ulcer or inflammation    Advised to consider daily use of compression stockings to manage edema.       Viral URI with cough    URI is most likely viral given the mild HEENT  Symptoms  And normal exam.   I have explained that in viral URIS, an antibiotic will not help the symptoms and will increase the risk of developing diarrhea.,  Continue oral and nasal decongestants, and tylenol 650 mq 8 hrs for aches and pains,  And will prednisone  taper for inflammation       Other Visit Diagnoses    Sore throat    -  Primary    Relevant Orders    POCT rapid strep A (Completed)    Long-term use of high-risk medication        Relevant Orders    Comprehensive metabolic panel (Completed)    CBC with Differential/Platelet (Completed)

## 2014-06-09 NOTE — Patient Instructions (Signed)
You have a viral  Syndrome .  The post nasal drip is causing your sore throat.  Your strep test was negative   Lavage your sinuses twice daily with Simply saline nasal spray.  Use benadryl 25 mg every 8 hours for the post nasal drip and Afrin nasal spray every 12 hours  as needed for sinus congestion.  Gargle with salt water as needed for the sore throat.  Delsym is available OTC as a cough syrup  If the throat is no better  In 3 to 4 days OR  if you develop T > 100.4,  Green nasal discharge,  Or facial pain,  Call the office and I will prescribe  an antibiotic.   Temporomandibular Problems  Temporomandibular joint (TMJ) dysfunction means there are problems with the joint between your jaw and your skull. This is a joint lined by cartilage like other joints in your body but also has a small disc in the joint which keeps the bones from rubbing on each other. These joints are like other joints and can get inflamed (sore) from arthritis and other problems. When this joint gets sore, it can cause headaches and pain in the jaw and the face. CAUSES  Usually the arthritic types of problems are caused by soreness in the joint. Soreness in the joint can also be caused by overuse. This may come from grinding your teeth. It may also come from mis-alignment in the joint. DIAGNOSIS Diagnosis of this condition can often be made by history and exam. Sometimes your caregiver may need X-rays or an MRI scan to determine the exact cause. It may be necessary to see your dentist to determine if your teeth and jaws are lined up correctly. TREATMENT  Most of the time this problem is not serious; however, sometimes it can persist (become chronic). When this happens medications that will cut down on inflammation (soreness) help. Sometimes a shot of cortisone into the joint will be helpful. If your teeth are not aligned it may help for your dentist to make a splint for your mouth that can help this problem. If no physical  problems can be found, the problem may come from tension. If tension is found to be the cause, biofeedback or relaxation techniques may be helpful. HOME CARE INSTRUCTIONS   Later in the day, applications of ice packs may be helpful. Ice can be used in a plastic bag with a towel around it to prevent frostbite to skin. This may be used about every 2 hours for 20 to 30 minutes, as needed while awake, or as directed by your caregiver.  Only take over-the-counter or prescription medicines for pain, discomfort, or fever as directed by your caregiver.  If physical therapy was prescribed, follow your caregiver's directions.  Wear mouth appliances as directed if they were given. Document Released: 01/22/2001 Document Revised: 07/22/2011 Document Reviewed: 05/01/2008 [facility] Patient Information 2015 [place], [place]. This information is not intended to replace advice given to you by your health care provider. Make sure you discuss any questions you have with your health care provider.

## 2014-06-12 DIAGNOSIS — S86919A Strain of unspecified muscle(s) and tendon(s) at lower leg level, unspecified leg, initial encounter: Secondary | ICD-10-CM | POA: Insufficient documentation

## 2014-06-12 DIAGNOSIS — R198 Other specified symptoms and signs involving the digestive system and abdomen: Secondary | ICD-10-CM | POA: Insufficient documentation

## 2014-06-12 DIAGNOSIS — J069 Acute upper respiratory infection, unspecified: Secondary | ICD-10-CM | POA: Insufficient documentation

## 2014-06-12 DIAGNOSIS — B9789 Other viral agents as the cause of diseases classified elsewhere: Secondary | ICD-10-CM

## 2014-06-12 DIAGNOSIS — M26609 Unspecified temporomandibular joint disorder, unspecified side: Secondary | ICD-10-CM | POA: Insufficient documentation

## 2014-06-12 DIAGNOSIS — I8393 Asymptomatic varicose veins of bilateral lower extremities: Secondary | ICD-10-CM | POA: Insufficient documentation

## 2014-06-12 NOTE — Assessment & Plan Note (Signed)
Secondary to recent beach walking.  Reassurance provided.

## 2014-06-12 NOTE — Assessment & Plan Note (Signed)
Her episodes occur once a week after taking her methotrexate dose. Reassurance provided .  Recommended trial of probiotics.

## 2014-06-12 NOTE — Assessment & Plan Note (Signed)
URI is most likely viral given the mild HEENT  Symptoms  And normal exam.   I have explained that in viral URIS, an antibiotic will not help the symptoms and will increase the risk of developing diarrhea.,  Continue oral and nasal decongestants, and tylenol 650 mq 8 hrs for aches and pains,  And will prednisone  taper for inflammation 

## 2014-06-12 NOTE — Assessment & Plan Note (Signed)
Advised to consider daily use of compression stockings to manage edema.

## 2014-06-12 NOTE — Assessment & Plan Note (Signed)
She has popping and tenderness of her right TM joint.  Supportive care outlined.

## 2014-06-13 ENCOUNTER — Encounter: Payer: Self-pay | Admitting: *Deleted

## 2014-07-15 ENCOUNTER — Telehealth: Payer: Self-pay | Admitting: Internal Medicine

## 2014-07-18 ENCOUNTER — Telehealth: Payer: Self-pay | Admitting: Internal Medicine

## 2014-07-18 DIAGNOSIS — Z7689 Persons encountering health services in other specified circumstances: Secondary | ICD-10-CM

## 2014-07-18 NOTE — Telephone Encounter (Signed)
Letter for [facility] printed per patient request .  pls charge $20 fee

## 2014-07-18 NOTE — Telephone Encounter (Signed)
Letter copied for scan and billing for 20.00 as requested and patient notified ready for pick up.

## 2014-08-26 DIAGNOSIS — Z79899 Other long term (current) drug therapy: Secondary | ICD-10-CM | POA: Diagnosis not present

## 2014-08-26 DIAGNOSIS — M1990 Unspecified osteoarthritis, unspecified site: Secondary | ICD-10-CM | POA: Diagnosis not present

## 2014-08-29 DIAGNOSIS — M79671 Pain in right foot: Secondary | ICD-10-CM | POA: Diagnosis not present

## 2014-08-29 DIAGNOSIS — M05771 Rheumatoid arthritis with rheumatoid factor of right ankle and foot without organ or systems involvement: Secondary | ICD-10-CM | POA: Diagnosis not present

## 2014-08-29 DIAGNOSIS — M65871 Other synovitis and tenosynovitis, right ankle and foot: Secondary | ICD-10-CM | POA: Diagnosis not present

## 2014-09-05 LAB — HEMOGLOBIN A1C: Hemoglobin A1C: 5.6

## 2014-09-20 DIAGNOSIS — M150 Primary generalized (osteo)arthritis: Secondary | ICD-10-CM | POA: Diagnosis not present

## 2014-09-20 DIAGNOSIS — M0609 Rheumatoid arthritis without rheumatoid factor, multiple sites: Secondary | ICD-10-CM | POA: Diagnosis not present

## 2014-09-20 DIAGNOSIS — Z79899 Other long term (current) drug therapy: Secondary | ICD-10-CM | POA: Diagnosis not present

## 2014-09-26 ENCOUNTER — Ambulatory Visit: Payer: Medicare Other | Admitting: Internal Medicine

## 2014-09-28 ENCOUNTER — Encounter: Payer: Self-pay | Admitting: Internal Medicine

## 2014-09-28 ENCOUNTER — Ambulatory Visit (INDEPENDENT_AMBULATORY_CARE_PROVIDER_SITE_OTHER): Payer: Medicare Other | Admitting: Internal Medicine

## 2014-09-28 VITALS — BP 114/64 | HR 86 | Temp 97.8°F | Resp 14 | Ht 65.5 in | Wt 144.8 lb

## 2014-09-28 DIAGNOSIS — M2660 Temporomandibular joint disorder, unspecified: Secondary | ICD-10-CM | POA: Diagnosis not present

## 2014-09-28 DIAGNOSIS — Z0000 Encounter for general adult medical examination without abnormal findings: Secondary | ICD-10-CM | POA: Diagnosis not present

## 2014-09-28 DIAGNOSIS — Z800 Family history of malignant neoplasm of digestive organs: Secondary | ICD-10-CM | POA: Diagnosis not present

## 2014-09-28 DIAGNOSIS — Z23 Encounter for immunization: Secondary | ICD-10-CM | POA: Diagnosis not present

## 2014-09-28 DIAGNOSIS — S86912A Strain of unspecified muscle(s) and tendon(s) at lower leg level, left leg, initial encounter: Secondary | ICD-10-CM

## 2014-09-28 DIAGNOSIS — M26609 Unspecified temporomandibular joint disorder, unspecified side: Secondary | ICD-10-CM

## 2014-09-28 DIAGNOSIS — I8393 Asymptomatic varicose veins of bilateral lower extremities: Secondary | ICD-10-CM

## 2014-09-28 DIAGNOSIS — M069 Rheumatoid arthritis, unspecified: Secondary | ICD-10-CM

## 2014-09-28 DIAGNOSIS — S86812A Strain of other muscle(s) and tendon(s) at lower leg level, left leg, initial encounter: Secondary | ICD-10-CM | POA: Diagnosis not present

## 2014-09-28 NOTE — Progress Notes (Signed)
The patient is here for annual Medicare wellness examination and management of other chronic and acute problems.   The risk factors are reflected in the social history.  The roster of all physicians providing medical care to patient - is listed in the Snapshot section of the chart.  Activities of daily living:  The patient is 100% independent in all ADLs: dressing, toileting, feeding as well as independent mobility  Home safety : The patient has smoke detectors in the home. They wear seatbelts.  There are no firearms at home. There is no violence in the home.   There is no risks for hepatitis, STDs or HIV. There is no   history of blood transfusion. They have no travel history to infectious disease endemic areas of the world.  The patient has seen their dentist in the last six month. They have seen their eye doctor in the last year. They admit to slight hearing difficulty with regard to whispered voices and some television programs.  They have deferred audiologic testing in the last year.  They do not  have excessive sun exposure. Discussed the need for sun protection: hats, long sleeves and use of sunscreen if there is significant sun exposure.   Diet: the importance of a healthy diet is discussed. They do have a healthy diet.  The benefits of regular aerobic exercise were discussed. She walks 4 times per week ,  20 minutes.   Depression screen: there are no signs or vegative symptoms of depression- irritability, change in appetite, anhedonia, sadness/tearfullness.  Cognitive assessment: the patient manages all their financial and personal affairs and is actively engaged. They could relate day,date,year and events; recalled 2/3 objects at 3 minutes; performed clock-face test normally.  The following portions of the patient's history were reviewed and updated as appropriate: allergies, current medications, past family history, past medical history,  past surgical history, past social history   and problem list.  Visual acuity was not assessed per patient preference since she has regular follow up with her ophthalmologist. Hearing and body mass index were assessed and reviewed.   During the course of the visit the patient was educated and counseled about appropriate screening and preventive services including : fall prevention , diabetes screening, nutrition counseling, colorectal cancer screening, and recommended immunizations.    CC: The primary encounter diagnosis was Family history of colon cancer. Diagnoses of Need for prophylactic vaccination against Streptococcus pneumoniae (pneumococcus), TMJ (temporomandibular joint syndrome), Muscle strain of knee, left, initial encounter, Rheumatoid arthritis, Varicose veins of both lower extremities without ulcer or inflammation, and Medicare annual wellness visit, subsequent were also pertinent to this visit. Muscle cramps in toes and ankles during exercise and at night Jaw pain on the right worse at night sometimes can't open mouth wide enough to eat dinner,  Varicose veins  On lower extremities sometimes get inflamed Urinary frequency with urge incontinence, 2 nighttime voids Reviewed CT pelvis done in December 2014.  Had liver test a1c and cr checked by Dr Jefm Bryant in April , reviewed with patient , all normal   History Iridian has a past medical history of Arthritis; Phlebitis and thrombophlebitis of deep veins of lower extremities; and Colon polyps.   She has past surgical history that includes Appendectomy and Joint replacement (Bilateral).   Her family history includes Cancer in her mother; Heart disease in her father and mother; Hypertension in her father, mother, and sister; Multiple sclerosis in her daughter, son, and son.She reports that she has never smoked. She  has never used smokeless tobacco. She reports that she drinks about 1.2 oz of alcohol per week. She reports that she does not use illicit drugs.  Outpatient Prescriptions  Prior to Visit  Medication Sig Dispense Refill  . aspirin 81 MG tablet Take 81 mg by mouth daily.    . folic acid (FOLVITE) 1 MG tablet Take 1 mg by mouth daily.     . hydroxychloroquine (PLAQUENIL) 200 MG tablet Take 200 mg by mouth daily.    . methotrexate (RHEUMATREX) 2.5 MG tablet Take 22.5 mg by mouth once a week.     . Multiple Vitamins-Minerals (MULTIVITAMIN WITH MINERALS) tablet Take 1 tablet by mouth daily.    . calcium-vitamin D (OSCAL WITH D) 500-200 MG-UNIT per tablet Take 1 tablet by mouth.    . predniSONE (DELTASONE) 5 MG tablet Take 5 mg by mouth daily. Only uses when RA flair, taper     No facility-administered medications prior to visit.    Review of Systems   Patient denies headache, fevers, malaise, unintentional weight loss, skin rash, eye pain, sinus congestion and sinus pain, sore throat, dysphagia,  hemoptysis , cough, dyspnea, wheezing, chest pain, palpitations, orthopnea, edema, abdominal pain, nausea, melena, diarrhea, constipation, flank pain, dysuria, hematuria, urinary  Frequency, nocturia, numbness, tingling, seizures,  Focal weakness, Loss of consciousness,  Tremor, insomnia, depression, anxiety, and suicidal ideation.     Objective:  BP 114/64 mmHg  Pulse 86  Temp(Src) 97.8 F (36.6 C) (Oral)  Resp 14  Ht 5' 5.5" (1.664 m)  Wt 144 lb 12 oz (65.658 kg)  BMI 23.71 kg/m2  SpO2 97%  Physical Exam  General appearance: alert, cooperative and appears stated age Head: Normocephalic, without obvious abnormality, atraumatic Eyes: conjunctivae/corneas clear. PERRL, EOM's intact. Fundi benign. Ears: normal TM's and external ear canals both ears Nose: Nares normal. Septum midline. Mucosa normal. No drainage or sinus tenderness. Throat: lips, mucosa, and tongue normal; teeth and gums normal Neck: no adenopathy, no carotid bruit, no JVD, supple, symmetrical, trachea midline and thyroid not enlarged, symmetric, no tenderness/mass/nodules Lungs: clear to  auscultation bilaterally Breasts: normal appearance, no masses or tenderness Heart: regular rate and rhythm, S1, S2 normal, no murmur, click, rub or gallop Abdomen: soft, non-tender; bowel sounds normal; no masses,  no organomegaly Extremities: extremities normal, atraumatic, no cyanosis or edema Pulses: 2+ and symmetric Skin: Skin color, texture, turgor normal. No rashes or lesions Neurologic: Alert and oriented X 3, normal strength and tone. Normal symmetric reflexes. Normal coordination and gait.    Assessment & Plan:   Problem List Items Addressed This Visit    Rheumatoid arthritis    Managed by Dr. Ivan Croft.       Muscle strain of knee    She has no effusion,  But is having muscle crapms at night,  Trial of turmeric and quinine. Reassurance provided to continue light aerobics in the water,       TMJ (temporomandibular joint syndrome)    affecting right side. Advised to avoid chewing gum,  Foods that require excessive bite pressure to chew,  Recommend trial of anti inflammatory and heating pad,  Vs trial of mouth guard if concurrent bruxism is confirmed by family       Varicose veins of both lower extremities without ulcer or inflammation    encouraged to use compression stockings to manage swelling,      Family history of colon cancer - Primary    Her last colonoscopy was 2013 by Dr Vira Agar.  Given her age, guidelines suggest stopping at age 70.  Will defer to Dr Vira Agar.       Medicare annual wellness visit, subsequent    Annual Medicare wellness  exam was done as well as a comprehensive physical exam and management of acute and chronic conditions .  During the course of the visit the patient was educated and counseled about appropriate screening and preventive services including : fall prevention , diabetes screening, nutrition counseling, colorectal cancer screening, and recommended immunizations.  Printed recommendations for health maintenance screenings was given.         Other Visit Diagnoses    Need for prophylactic vaccination against Streptococcus pneumoniae (pneumococcus)        Relevant Orders    Pneumococcal conjugate vaccine 13-valent (Completed)       I am having Ms. Heward maintain her methotrexate, folic acid, hydroxychloroquine, predniSONE, aspirin, calcium-vitamin D, and multivitamin with minerals.  No orders of the defined types were placed in this encounter.    There are no discontinued medications.  Follow-up: Return in about 6 months (around 03/31/2015).   Crecencio Mc, MD

## 2014-09-28 NOTE — Patient Instructions (Addendum)
Try adding turmeric 9in capsule form)  daily and quinine for your muscle cramps   Your are doing very well!! You may not need any more colonoscopies,  We will check with dr Vira Agar  Health Maintenance Adopting a healthy lifestyle and getting preventive care can go a long way to promote health and wellness. Talk with your health care provider about what schedule of regular examinations is right for you. This is a good chance for you to check in with your provider about disease prevention and staying healthy. In between checkups, there are plenty of things you can do on your own. Experts have done a lot of research about which lifestyle changes and preventive measures are most likely to keep you healthy. Ask your health care provider for more information. WEIGHT AND DIET  Eat a healthy diet  Be sure to include plenty of vegetables, fruits, low-fat dairy products, and lean protein.  Do not eat a lot of foods high in solid fats, added sugars, or salt.  Get regular exercise. This is one of the most important things you can do for your health.  Most adults should exercise for at least 150 minutes each week. The exercise should increase your heart rate and make you sweat (moderate-intensity exercise).  Most adults should also do strengthening exercises at least twice a week. This is in addition to the moderate-intensity exercise.  Maintain a healthy weight  Body mass index (BMI) is a measurement that can be used to identify possible weight problems. It estimates body fat based on height and weight. Your health care provider can help determine your BMI and help you achieve or maintain a healthy weight.  For females 11 years of age and older:   A BMI below 18.5 is considered underweight.  A BMI of 18.5 to 24.9 is normal.  A BMI of 25 to 29.9 is considered overweight.  A BMI of 30 and above is considered obese.  Watch levels of cholesterol and blood lipids  You should start having your  blood tested for lipids and cholesterol at 79 years of age, then have this test every 5 years.  You may need to have your cholesterol levels checked more often if:  Your lipid or cholesterol levels are high.  You are older than 80 years of age.  You are at high risk for heart disease.  CANCER SCREENING   Lung Cancer  Lung cancer screening is recommended for adults 54-64 years old who are at high risk for lung cancer because of a history of smoking.  A yearly low-dose CT scan of the lungs is recommended for people who:  Currently smoke.  Have quit within the past 15 years.  Have at least a 30-pack-year history of smoking. A pack year is smoking an average of one pack of cigarettes a day for 1 year.  Yearly screening should continue until it has been 15 years since you quit.  Yearly screening should stop if you develop a health problem that would prevent you from having lung cancer treatment.  Breast Cancer  Practice breast self-awareness. This means understanding how your breasts normally appear and feel.  It also means doing regular breast self-exams. Let your health care provider know about any changes, no matter how small.  If you are in your 20s or 30s, you should have a clinical breast exam (CBE) by a health care provider every 1-3 years as part of a regular health exam.  If you are 40 or older, have  a CBE every year. Also consider having a breast X-ray (mammogram) every year.  If you have a family history of breast cancer, talk to your health care provider about genetic screening.  If you are at high risk for breast cancer, talk to your health care provider about having an MRI and a mammogram every year.  Breast cancer gene (BRCA) assessment is recommended for women who have family members with BRCA-related cancers. BRCA-related cancers include:  Breast.  Ovarian.  Tubal.  Peritoneal cancers.  Results of the assessment will determine the need for genetic  counseling and BRCA1 and BRCA2 testing. Cervical Cancer Routine pelvic examinations to screen for cervical cancer are no longer recommended for nonpregnant women who are considered low risk for cancer of the pelvic organs (ovaries, uterus, and vagina) and who do not have symptoms. A pelvic examination may be necessary if you have symptoms including those associated with pelvic infections. Ask your health care provider if a screening pelvic exam is right for you.   The Pap test is the screening test for cervical cancer for women who are considered at risk.  If you had a hysterectomy for a problem that was not cancer or a condition that could lead to cancer, then you no longer need Pap tests.  If you are older than 65 years, and you have had normal Pap tests for the past 10 years, you no longer need to have Pap tests.  If you have had past treatment for cervical cancer or a condition that could lead to cancer, you need Pap tests and screening for cancer for at least 20 years after your treatment.  If you no longer get a Pap test, assess your risk factors if they change (such as having a new sexual partner). This can affect whether you should start being screened again.  Some women have medical problems that increase their chance of getting cervical cancer. If this is the case for you, your health care provider may recommend more frequent screening and Pap tests.  The human papillomavirus (HPV) test is another test that may be used for cervical cancer screening. The HPV test looks for the virus that can cause cell changes in the cervix. The cells collected during the Pap test can be tested for HPV.  The HPV test can be used to screen women 26 years of age and older. Getting tested for HPV can extend the interval between normal Pap tests from three to five years.  An HPV test also should be used to screen women of any age who have unclear Pap test results.  After 79 years of age, women should have  HPV testing as often as Pap tests.  Colorectal Cancer  This type of cancer can be detected and often prevented.  Routine colorectal cancer screening usually begins at 79 years of age and continues through 79 years of age.  Your health care provider may recommend screening at an earlier age if you have risk factors for colon cancer.  Your health care provider may also recommend using home test kits to check for hidden blood in the stool.  A small camera at the end of a tube can be used to examine your colon directly (sigmoidoscopy or colonoscopy). This is done to check for the earliest forms of colorectal cancer.  Routine screening usually begins at age 84.  Direct examination of the colon should be repeated every 5-10 years through 79 years of age. However, you may need to be screened more  often if early forms of precancerous polyps or small growths are found. Skin Cancer  Check your skin from head to toe regularly.  Tell your health care provider about any new moles or changes in moles, especially if there is a change in a mole's shape or color.  Also tell your health care provider if you have a mole that is larger than the size of a pencil eraser.  Always use sunscreen. Apply sunscreen liberally and repeatedly throughout the day.  Protect yourself by wearing long sleeves, pants, a wide-brimmed hat, and sunglasses whenever you are outside. HEART DISEASE, DIABETES, AND HIGH BLOOD PRESSURE   Have your blood pressure checked at least every 1-2 years. High blood pressure causes heart disease and increases the risk of stroke.  If you are between 33 years and 45 years old, ask your health care provider if you should take aspirin to prevent strokes.  Have regular diabetes screenings. This involves taking a blood sample to check your fasting blood sugar level.  If you are at a normal weight and have a low risk for diabetes, have this test once every three years after 79 years of  age.  If you are overweight and have a high risk for diabetes, consider being tested at a younger age or more often. PREVENTING INFECTION  Hepatitis B  If you have a higher risk for hepatitis B, you should be screened for this virus. You are considered at high risk for hepatitis B if:  You were born in a country where hepatitis B is common. Ask your health care provider which countries are considered high risk.  Your parents were born in a high-risk country, and you have not been immunized against hepatitis B (hepatitis B vaccine).  You have HIV or AIDS.  You use needles to inject street drugs.  You live with someone who has hepatitis B.  You have had sex with someone who has hepatitis B.  You get hemodialysis treatment.  You take certain medicines for conditions, including cancer, organ transplantation, and autoimmune conditions. Hepatitis C  Blood testing is recommended for:  Everyone born from [birth year] through 1963.  Anyone with known risk factors for hepatitis C. Sexually transmitted infections (STIs)  You should be screened for sexually transmitted infections (STIs) including gonorrhea and chlamydia if:  You are sexually active and are younger than 79 years of age.  You are older than 79 years of age and your health care provider tells you that you are at risk for this type of infection.  Your sexual activity has changed since you were last screened and you are at an increased risk for chlamydia or gonorrhea. Ask your health care provider if you are at risk.  If you do not have HIV, but are at risk, it may be recommended that you take a prescription medicine daily to prevent HIV infection. This is called pre-exposure prophylaxis (PrEP). You are considered at risk if:  You are sexually active and do not regularly use condoms or know the HIV status of your partner(s).  You take drugs by injection.  You are sexually active with a partner who has HIV. Talk with your health  care provider about whether you are at high risk of being infected with HIV. If you choose to begin PrEP, you should first be tested for HIV. You should then be tested every 3 months for as long as you are taking PrEP.  PREGNANCY   If you are premenopausal and you may become  pregnant, ask your health care provider about preconception counseling.  If you may become pregnant, take 400 to 800 micrograms (mcg) of folic acid every day.  If you want to prevent pregnancy, talk to your health care provider about birth control (contraception). OSTEOPOROSIS AND MENOPAUSE   Osteoporosis is a disease in which the bones lose minerals and strength with aging. This can result in serious bone fractures. Your risk for osteoporosis can be identified using a bone density scan.  If you are 42 years of age or older, or if you are at risk for osteoporosis and fractures, ask your health care provider if you should be screened.  Ask your health care provider whether you should take a calcium or vitamin D supplement to lower your risk for osteoporosis.  Menopause may have certain physical symptoms and risks.  Hormone replacement therapy may reduce some of these symptoms and risks. Talk to your health care provider about whether hormone replacement therapy is right for you.  HOME CARE INSTRUCTIONS   Schedule regular health, dental, and eye exams.  Stay current with your immunizations.   Do not use any tobacco products including cigarettes, chewing tobacco, or electronic cigarettes.  If you are pregnant, do not drink alcohol.  If you are breastfeeding, limit how much and how often you drink alcohol.  Limit alcohol intake to no more than 1 drink per day for nonpregnant women. One drink equals 12 ounces of beer, 5 ounces of wine, or 1 ounces of hard liquor.  Do not use street drugs.  Do not share needles.  Ask your health care provider for help if you need support or information about quitting  drugs.  Tell your health care provider if you often feel depressed.  Tell your health care provider if you have ever been abused or do not feel safe at home. Document Released: 11/12/2010 Document Revised: 09/13/2013 Document Reviewed: 03/31/2013 [facility] Patient Information 2015 [place], [place]. This information is not intended to replace advice given to you by your health care provider. Make sure you discuss any questions you have with your health care provider.

## 2014-09-28 NOTE — Progress Notes (Signed)
Pre-visit discussion using our clinic review tool. No additional management support is needed unless otherwise documented below in the visit note.  

## 2014-10-01 ENCOUNTER — Encounter: Payer: Self-pay | Admitting: Internal Medicine

## 2014-10-01 DIAGNOSIS — Z1239 Encounter for other screening for malignant neoplasm of breast: Secondary | ICD-10-CM | POA: Insufficient documentation

## 2014-10-01 NOTE — Assessment & Plan Note (Addendum)
Her last colonoscopy was 2013 by Dr Vira Agar.  Given her age, guidelines suggest stopping at age 81.  Will defer to Dr Vira Agar.

## 2014-10-01 NOTE — Assessment & Plan Note (Signed)

## 2014-10-01 NOTE — Assessment & Plan Note (Addendum)
She has no effusion,  But is having muscle crapms at night,  Trial of turmeric and quinine. Reassurance provided to continue light aerobics in the water,

## 2014-10-01 NOTE — Assessment & Plan Note (Signed)
encouraged to use compression stockings to manage swelling,

## 2014-10-01 NOTE — Assessment & Plan Note (Addendum)
affecting right side. Advised to avoid chewing gum,  Foods that require excessive bite pressure to chew,  Recommend trial of anti inflammatory and heating pad,  Vs trial of mouth guard if concurrent bruxism is confirmed by family

## 2014-10-01 NOTE — Assessment & Plan Note (Signed)
Managed by Dr. Ivan Croft.

## 2014-10-19 DIAGNOSIS — M170 Bilateral primary osteoarthritis of knee: Secondary | ICD-10-CM | POA: Diagnosis not present

## 2014-10-19 DIAGNOSIS — M169 Osteoarthritis of hip, unspecified: Secondary | ICD-10-CM | POA: Diagnosis not present

## 2014-10-19 DIAGNOSIS — M25562 Pain in left knee: Secondary | ICD-10-CM | POA: Diagnosis not present

## 2014-10-19 DIAGNOSIS — Z96653 Presence of artificial knee joint, bilateral: Secondary | ICD-10-CM | POA: Diagnosis not present

## 2014-10-19 DIAGNOSIS — M25561 Pain in right knee: Secondary | ICD-10-CM | POA: Diagnosis not present

## 2014-11-02 DIAGNOSIS — D2262 Melanocytic nevi of left upper limb, including shoulder: Secondary | ICD-10-CM | POA: Diagnosis not present

## 2014-11-02 DIAGNOSIS — D2272 Melanocytic nevi of left lower limb, including hip: Secondary | ICD-10-CM | POA: Diagnosis not present

## 2014-11-02 DIAGNOSIS — L821 Other seborrheic keratosis: Secondary | ICD-10-CM | POA: Diagnosis not present

## 2014-11-02 DIAGNOSIS — Z85828 Personal history of other malignant neoplasm of skin: Secondary | ICD-10-CM | POA: Diagnosis not present

## 2014-11-07 DIAGNOSIS — S50311A Abrasion of right elbow, initial encounter: Secondary | ICD-10-CM | POA: Diagnosis not present

## 2014-11-07 DIAGNOSIS — Z23 Encounter for immunization: Secondary | ICD-10-CM | POA: Diagnosis not present

## 2014-11-07 DIAGNOSIS — S81012A Laceration without foreign body, left knee, initial encounter: Secondary | ICD-10-CM | POA: Diagnosis not present

## 2014-11-08 DIAGNOSIS — Z09 Encounter for follow-up examination after completed treatment for conditions other than malignant neoplasm: Secondary | ICD-10-CM | POA: Diagnosis not present

## 2014-11-16 ENCOUNTER — Telehealth: Payer: Self-pay

## 2014-11-16 DIAGNOSIS — S81011D Laceration without foreign body, right knee, subsequent encounter: Secondary | ICD-10-CM | POA: Diagnosis not present

## 2014-11-16 DIAGNOSIS — W57XXXA Bitten or stung by nonvenomous insect and other nonvenomous arthropods, initial encounter: Secondary | ICD-10-CM | POA: Diagnosis not present

## 2014-11-16 DIAGNOSIS — Z4802 Encounter for removal of sutures: Secondary | ICD-10-CM | POA: Diagnosis not present

## 2014-11-16 DIAGNOSIS — L03313 Cellulitis of chest wall: Secondary | ICD-10-CM | POA: Diagnosis not present

## 2014-11-16 NOTE — Telephone Encounter (Signed)
Patient came in the office to discuss a tick bite that she was just seen at the [facility] walk in clinic for.  The provider there prescribed doxycycline for the patient to start today.  Patient has concerns also that the Dr. Rockey Situ her not to take her methotrexate.  Patient is concerned as Dr. Jefm Bryant told her to never stop it.  Reviewed with Dr. Derrel Nip.  Patient verbalized understanding to start the antibiotics and to notify Dr. Jefm Bryant of the change to get advice.

## 2014-12-14 DIAGNOSIS — M0609 Rheumatoid arthritis without rheumatoid factor, multiple sites: Secondary | ICD-10-CM | POA: Diagnosis not present

## 2014-12-14 DIAGNOSIS — M79671 Pain in right foot: Secondary | ICD-10-CM | POA: Diagnosis not present

## 2015-02-09 DIAGNOSIS — R079 Chest pain, unspecified: Secondary | ICD-10-CM | POA: Insufficient documentation

## 2015-02-09 DIAGNOSIS — E780 Pure hypercholesterolemia: Secondary | ICD-10-CM | POA: Diagnosis not present

## 2015-02-09 DIAGNOSIS — R1031 Right lower quadrant pain: Secondary | ICD-10-CM | POA: Diagnosis not present

## 2015-02-09 DIAGNOSIS — I10 Essential (primary) hypertension: Secondary | ICD-10-CM | POA: Diagnosis not present

## 2015-02-09 DIAGNOSIS — R1032 Left lower quadrant pain: Secondary | ICD-10-CM | POA: Diagnosis not present

## 2015-02-10 ENCOUNTER — Ambulatory Visit
Admission: RE | Admit: 2015-02-10 | Discharge: 2015-02-10 | Disposition: A | Discharge status: Home / Self Care | Payer: Medicare Other | Source: Ambulatory Visit | Attending: Nurse Practitioner | Admitting: Nurse Practitioner

## 2015-02-10 ENCOUNTER — Other Ambulatory Visit: Payer: Self-pay | Admitting: Nurse Practitioner

## 2015-02-10 DIAGNOSIS — R1032 Left lower quadrant pain: Secondary | ICD-10-CM

## 2015-02-10 DIAGNOSIS — R1031 Right lower quadrant pain: Secondary | ICD-10-CM

## 2015-02-10 DIAGNOSIS — R10819 Abdominal tenderness, unspecified site: Secondary | ICD-10-CM

## 2015-02-10 DIAGNOSIS — I2510 Atherosclerotic heart disease of native coronary artery without angina pectoris: Secondary | ICD-10-CM | POA: Diagnosis not present

## 2015-02-10 DIAGNOSIS — M5136 Other intervertebral disc degeneration, lumbar region: Secondary | ICD-10-CM | POA: Insufficient documentation

## 2015-02-10 DIAGNOSIS — R1030 Lower abdominal pain, unspecified: Secondary | ICD-10-CM | POA: Diagnosis not present

## 2015-02-10 DIAGNOSIS — K8020 Calculus of gallbladder without cholecystitis without obstruction: Secondary | ICD-10-CM | POA: Insufficient documentation

## 2015-02-10 HISTORY — DX: Systemic involvement of connective tissue, unspecified: M35.9

## 2015-02-10 MED ORDER — IOHEXOL 300 MG/ML  SOLN
100.0000 mL | Freq: Once | INTRAMUSCULAR | Status: AC | PRN
  Administered 2015-02-10: 100 mL via INTRAVENOUS

## 2015-02-27 DIAGNOSIS — R079 Chest pain, unspecified: Secondary | ICD-10-CM | POA: Diagnosis not present

## 2015-03-06 DIAGNOSIS — M0609 Rheumatoid arthritis without rheumatoid factor, multiple sites: Secondary | ICD-10-CM | POA: Diagnosis not present

## 2015-03-06 DIAGNOSIS — Z79899 Other long term (current) drug therapy: Secondary | ICD-10-CM | POA: Diagnosis not present

## 2015-03-15 DIAGNOSIS — R079 Chest pain, unspecified: Secondary | ICD-10-CM | POA: Diagnosis not present

## 2015-03-15 DIAGNOSIS — I10 Essential (primary) hypertension: Secondary | ICD-10-CM | POA: Diagnosis not present

## 2015-03-15 DIAGNOSIS — E7800 Pure hypercholesterolemia, unspecified: Secondary | ICD-10-CM | POA: Diagnosis not present

## 2015-03-16 DIAGNOSIS — M150 Primary generalized (osteo)arthritis: Secondary | ICD-10-CM | POA: Diagnosis not present

## 2015-03-16 DIAGNOSIS — M0609 Rheumatoid arthritis without rheumatoid factor, multiple sites: Secondary | ICD-10-CM | POA: Diagnosis not present

## 2015-03-21 DIAGNOSIS — M0609 Rheumatoid arthritis without rheumatoid factor, multiple sites: Secondary | ICD-10-CM | POA: Diagnosis not present

## 2015-03-22 DIAGNOSIS — M05771 Rheumatoid arthritis with rheumatoid factor of right ankle and foot without organ or systems involvement: Secondary | ICD-10-CM | POA: Diagnosis not present

## 2015-03-22 DIAGNOSIS — M659 Synovitis and tenosynovitis, unspecified: Secondary | ICD-10-CM | POA: Diagnosis not present

## 2015-03-22 DIAGNOSIS — M79671 Pain in right foot: Secondary | ICD-10-CM | POA: Diagnosis not present

## 2015-03-24 ENCOUNTER — Ambulatory Visit (INDEPENDENT_AMBULATORY_CARE_PROVIDER_SITE_OTHER): Payer: Medicare Other | Admitting: Internal Medicine

## 2015-03-24 ENCOUNTER — Encounter: Payer: Self-pay | Admitting: Internal Medicine

## 2015-03-24 VITALS — BP 120/70 | HR 88 | Temp 97.6°F | Ht 65.5 in | Wt 147.8 lb

## 2015-03-24 DIAGNOSIS — Z79899 Other long term (current) drug therapy: Secondary | ICD-10-CM | POA: Diagnosis not present

## 2015-03-24 DIAGNOSIS — R2689 Other abnormalities of gait and mobility: Secondary | ICD-10-CM

## 2015-03-24 DIAGNOSIS — I7090 Unspecified atherosclerosis: Secondary | ICD-10-CM

## 2015-03-24 DIAGNOSIS — R29818 Other symptoms and signs involving the nervous system: Secondary | ICD-10-CM

## 2015-03-24 DIAGNOSIS — I708 Atherosclerosis of other arteries: Secondary | ICD-10-CM

## 2015-03-24 DIAGNOSIS — E785 Hyperlipidemia, unspecified: Secondary | ICD-10-CM | POA: Diagnosis not present

## 2015-03-24 DIAGNOSIS — Z23 Encounter for immunization: Secondary | ICD-10-CM | POA: Diagnosis not present

## 2015-03-24 DIAGNOSIS — R296 Repeated falls: Secondary | ICD-10-CM

## 2015-03-24 DIAGNOSIS — R0789 Other chest pain: Secondary | ICD-10-CM

## 2015-03-24 NOTE — Progress Notes (Signed)
Pre visit review using our clinic review tool, if applicable. No additional management support is needed unless otherwise documented below in the visit note. 

## 2015-03-24 NOTE — Patient Instructions (Signed)
I'm glad you decided to get the flu vaccine!  Use a heating pad on your sore trapezius muscle  You should focus on core strength and BALANCE when you work out.  [facility] or call for a Physical Therapy referral if you prefer    Tai Chi is also excellent for BALANCE.  The next time you have urinary symptoms , please come here for evaluation   Please return for fasting labs

## 2015-03-24 NOTE — Progress Notes (Signed)
Subjective:  Patient ID: Teresa Lin, female    DOB: [birth year]  Age: 81 y.o. MRN: [id number]  CC: The primary encounter diagnosis was Loss of balance. Diagnoses of Frequent falls, Hyperlipidemia, Long-term use of high-risk medication, Needs flu shot, Atherosclerosis of arteries, and Other chest pain were also pertinent to this visit.  HPI Teresa Lin presents for evaluation of  Multiple issues both acute and chronic.  She has had 4 falls in the last 6 months.   1) Fell two weeks ago while in the bathroom, struck her right wrist on the tub, Right wrist has been hurting since then. Did not turn black and blue,  ROM normal.  Has been working out with weights at the gym without supervision of form. Marland Kitchen   2) [place] after tripping on the leg of a friend's coffee table and hit her left hip and left shoulder   3) Tripped over a cement paver in the yard while walking.  She denies any episdoes of dizziness   She  Has no history of glaucoma or cataracts but is overdue for annual eye exam and has already scheduled it for January.  Denies any vision changes. Her children have been warnign her that she is not careful.  Moves too quickly.   She is consiering taking a Tai Chi class.    Treated for cystitis in October .   History of hematuria evaluated by Dr Terance Hart   Frequent nocturnal wakenings .  Does not snore but wakes up with dry mouth   Had a 2nd Kenalog injection in right ankle for persistent pain and swelling secondary to RA by [facility] yesterday (Nov 9, first was in April).  X rays showed degenerative an dOA changes to multiple joints and joint space narrowing.  She  was advised to wear tennis shoes    But is nt wearing them enough. Still using bedroom slippers while at ome and fashionable boots when out.   Treated by [facility] U[facility] secondary to symptoms,  Review of records note only a abnormal UA ,No culture results available. But per patient and chart the antiibiotics were stopped by  evaluation physician after "culture was negative"  Hematuria and pyuria noted  On UA done at [facility].  Patient has had prior cystocscopic ac CT of GU system were done in 2014by Urology for hematuria,  Both negative for bladder masses.    Atherosclerosis of vessels and diverticulosis were noted on 2014 CT scan per review of records today.   Recent apsides of atypical chest pain,  Saw Dr Saralyn Pilar,  a stress ECHO was done and reported as normal. Discussed with patient today per her request.    Outpatient Prescriptions Prior to Visit  Medication Sig Dispense Refill  . aspirin 81 MG tablet Take 81 mg by mouth daily.    . folic acid (FOLVITE) 1 MG tablet Take 1 mg by mouth daily.     . hydroxychloroquine (PLAQUENIL) 200 MG tablet Take 200 mg by mouth daily.    . methotrexate (RHEUMATREX) 2.5 MG tablet Take 22.5 mg by mouth once a week.     . Multiple Vitamins-Minerals (MULTIVITAMIN WITH MINERALS) tablet Take 1 tablet by mouth daily.    . calcium-vitamin D (OSCAL WITH D) 500-200 MG-UNIT per tablet Take 1 tablet by mouth.    . predniSONE (DELTASONE) 5 MG tablet Take 5 mg by mouth daily. Only uses when RA flair, taper     No facility-administered medications prior to visit.  Review of Systems;  Patient denies headache, fevers, malaise, unintentional weight loss, skin rash, eye pain, sinus congestion and sinus pain, sore throat, dysphagia,  hemoptysis , cough, dyspnea, wheezing, chest pain, palpitations, orthopnea, edema, abdominal pain, nausea, melena, diarrhea, constipation, flank pain, dysuria, hematuria, urinary  Frequency, nocturia, numbness, tingling, seizures,  Focal weakness, Loss of consciousness,  Tremor, insomnia, depression, anxiety, and suicidal ideation.      Objective:  BP 120/70 mmHg  Pulse 88  Temp(Src) 97.6 F (36.4 C) (Oral)  Ht 5' 5.5" (1.664 m)  Wt 147 lb 12.8 oz (67.042 kg)  BMI 24.21 kg/m2  SpO2 96%  BP Readings from Last 3 Encounters:  03/24/15 120/70  09/28/14  114/64  06/09/14 120/60    Wt Readings from Last 3 Encounters:  03/24/15 147 lb 12.8 oz (67.042 kg)  09/28/14 144 lb 12 oz (65.658 kg)  06/09/14 142 lb 8 oz (64.638 kg)    General appearance: alert, cooperative and appears stated age Ears: normal TM's and external ear canals both ears Throat: lips, mucosa, and tongue normal; teeth and gums normal Neck: no adenopathy, no carotid bruit, supple, symmetrical, trachea midline and thyroid not enlarged, symmetric, no tenderness/mass/nodules Back: symmetric, no curvature. ROM normal. No CVA tenderness. Lungs: clear to auscultation bilaterally Heart: regular rate and rhythm, S1, S2 normal, no murmur, click, rub or gallop Abdomen: soft, non-tender; bowel sounds normal; no masses,  no organomegaly Pulses: 2+ and symmetric Skin: Skin color, texture, turgor normal. No rashes or lesions Lymph nodes: Cervical, supraclavicular, and axillary nodes normal. MSK;  Cannot stand on one leg for > 1 sec without losing balance.  Neuro: CNs 2-12 intact. DTRs 2+/4 in biceps, brachioradialis, patellars and achilles. Muscle strength 5/5 in upper and lower exremities. Fine resting tremor bilaterally both hands cerebellar function normal. Romberg negative.  No pronator drift.   Gait normal.   No results found for: HGBA1C  Lab Results  Component Value Date   CREATININE 0.59 06/09/2014   CREATININE 0.7 02/03/2014   CREATININE 0.6 03/10/2013    Lab Results  Component Value Date   WBC 5.4 06/09/2014   HGB 14.2 06/09/2014   HCT 42.5 06/09/2014   PLT 283.0 06/09/2014   GLUCOSE 75 06/09/2014   CHOL 182 02/03/2014   TRIG 65.0 02/03/2014   HDL 67.00 02/03/2014   LDLCALC 102* 02/03/2014   ALT 13 06/09/2014   AST 24 06/09/2014   NA 139 06/09/2014   K 4.6 06/09/2014   CL 103 06/09/2014   CREATININE 0.59 06/09/2014   BUN 12 06/09/2014   CO2 29 06/09/2014   TSH 0.79 02/03/2014   INR 0.9 08/08/2011    Ct Abdomen Pelvis W Contrast  02/10/2015  CLINICAL  DATA:  Bilateral lower abdominal pain for 7-10 days. Loose stools this morning. Evaluate for diverticulitis. EXAM: CT ABDOMEN AND PELVIS WITH CONTRAST TECHNIQUE: Multidetector CT imaging of the abdomen and pelvis was performed using the standard protocol following bolus administration of intravenous contrast. CONTRAST:  123m OMNIPAQUE IOHEXOL 300 MG/ML  SOLN COMPARISON:  05/29/2009 FINDINGS: Lower chest: Clear lung bases. Normal heart size without pericardial or pleural effusion. Hepatobiliary: Too small to characterize lateral segment left liver lobe lesions. Cholelithiasis without surrounding inflammation or biliary duct dilatation. Pancreas: Probable pancreas divisum, with a prominent dorsal duct entering the duodenum on image 27 of series 2. No duct dilatation, mass, or acute inflammation. Spleen: Normal in size, without focal abnormality. Adrenals/Urinary Tract: Normal adrenal glands. Bilateral extrarenal pelves. A too small to characterize lesion  in the left kidney. Normal urinary bladder. Stomach/Bowel: Normal stomach, without wall thickening. Normal colon and terminal ileum. Normal small bowel. Vascular/Lymphatic: Aortic and branch vessel atherosclerosis. No abdominopelvic adenopathy. Reproductive: Normal uterus and adnexa. Other: No significant free fluid. Musculoskeletal: Remote right inferior pubic ramus trauma. Mild convex right lumbar spine curvature. Degenerative disc disease involves L4-5 and L5-S1. IMPRESSION: 1. No acute process in the abdomen or pelvis. No evidence of diverticulitis. 2. Cholelithiasis. 3. Probable pancreas divisum.  No acute pancreatitis. Electronically Signed   By: Abigail Miyamoto M.D.   On: 02/10/2015 14:23    Assessment & Plan:   Problem List Items Addressed This Visit    Frequent falls    Recommended PT referral for balance and core strengthening exercises  but she declined.   Demonstrated the fundamentals of good balance recommended she take [place] and work with a  trainer on balance and strengthening of glut muscles.       Relevant Orders   VITAMIN D 25 Hydroxy (Vit-D Deficiency, Fractures)   Atherosclerosis of arteries    Noted on 2014 and Sept 2016 CT abd/pelvis.  Return for fasting lipids and add statin for LDL > 100      Chest pain    Patient had recent stress ECHO evaluation by [facility] due to report of chest pain ,  Test was normal. Discussed risk factors including age, present of RA .  Given finding on CT,  Will recommend cardiac  CT if symptoms recur.        Other Visit Diagnoses    Loss of balance    -  Primary    Relevant Orders    TSH    B12    Hyperlipidemia        Relevant Orders    Lipid panel    Long-term use of high-risk medication        Relevant Orders    Comprehensive metabolic panel    CBC with Differential/Platelet    Needs flu shot        Relevant Orders    Flu vaccine HIGH DOSE PF (Fluzone High dose) (Completed)       A total of 40 minutes was spent with patient more than half of which was spent in counseling patient on the above mentioned issues , reviewing and explaining recent labs and imaging studies done, and coordination of care. I am having Ms. Holben maintain her methotrexate, folic acid, hydroxychloroquine, predniSONE, aspirin, calcium-vitamin D, and multivitamin with minerals.  No orders of the defined types were placed in this encounter.    There are no discontinued medications.  Follow-up: No Follow-up on file.   Crecencio Mc, MD

## 2015-03-26 DIAGNOSIS — I7090 Unspecified atherosclerosis: Secondary | ICD-10-CM | POA: Insufficient documentation

## 2015-03-26 DIAGNOSIS — R296 Repeated falls: Secondary | ICD-10-CM | POA: Insufficient documentation

## 2015-03-26 DIAGNOSIS — I708 Atherosclerosis of other arteries: Secondary | ICD-10-CM | POA: Insufficient documentation

## 2015-03-26 NOTE — Assessment & Plan Note (Signed)
Recommended PT referral for balance and core strengthening exercises  but she declined.   Demonstrated the fundamentals of good balance recommended she take [place] and work with a trainer on balance and strengthening of glut muscles.

## 2015-03-26 NOTE — Assessment & Plan Note (Addendum)
Noted on 2014 CT abd/pelvis.  Return for fasting lipids and add statin for LDL > 100

## 2015-03-26 NOTE — Assessment & Plan Note (Signed)
Patient had recent tress ECHO evaluation by [facility] due to report of chest pain ,  Test was normal. Discussed risk factors including age, present of RA .  Given finding on CT,  Will recommend cardiac  CT if symptoms recur.

## 2015-04-18 DIAGNOSIS — Z96653 Presence of artificial knee joint, bilateral: Secondary | ICD-10-CM | POA: Diagnosis not present

## 2015-05-16 DIAGNOSIS — Z961 Presence of intraocular lens: Secondary | ICD-10-CM | POA: Diagnosis not present

## 2015-05-16 DIAGNOSIS — H04123 Dry eye syndrome of bilateral lacrimal glands: Secondary | ICD-10-CM | POA: Diagnosis not present

## 2015-05-26 ENCOUNTER — Telehealth: Payer: Self-pay

## 2015-05-26 DIAGNOSIS — R109 Unspecified abdominal pain: Secondary | ICD-10-CM | POA: Diagnosis not present

## 2015-05-26 NOTE — Telephone Encounter (Signed)
Pt called triage line and stated that she needed appt because of consitpation, she was scheduled to see Dr. Derrel Nip to see on Monday 05/29/15. Pt called back and stated that she threw up the laxative and water, she fears she has a blockage and can't wait till Monday to be seen, she is in [place] with her daughter. I explained to Ms.Dotts that she need to be evaluated at the ER right away./tvw

## 2015-05-29 ENCOUNTER — Ambulatory Visit (INDEPENDENT_AMBULATORY_CARE_PROVIDER_SITE_OTHER): Payer: Medicare Other | Admitting: Internal Medicine

## 2015-05-29 ENCOUNTER — Ambulatory Visit
Admission: RE | Admit: 2015-05-29 | Discharge: 2015-05-29 | Disposition: A | Discharge status: Home / Self Care | Payer: Medicare Other | Source: Ambulatory Visit | Attending: Internal Medicine | Admitting: Internal Medicine

## 2015-05-29 ENCOUNTER — Other Ambulatory Visit: Admission: RE | Admit: 2015-05-29 | Payer: Medicare Other | Source: Ambulatory Visit | Admitting: Internal Medicine

## 2015-05-29 ENCOUNTER — Encounter: Payer: Self-pay | Admitting: Internal Medicine

## 2015-05-29 VITALS — BP 118/66 | HR 89 | Temp 98.1°F | Resp 12 | Ht 66.0 in | Wt 143.2 lb

## 2015-05-29 DIAGNOSIS — S299XXA Unspecified injury of thorax, initial encounter: Secondary | ICD-10-CM | POA: Diagnosis not present

## 2015-05-29 DIAGNOSIS — K5900 Constipation, unspecified: Secondary | ICD-10-CM

## 2015-05-29 DIAGNOSIS — M549 Dorsalgia, unspecified: Secondary | ICD-10-CM

## 2015-05-29 DIAGNOSIS — S32020G Wedge compression fracture of second lumbar vertebra, subsequent encounter for fracture with delayed healing: Secondary | ICD-10-CM | POA: Insufficient documentation

## 2015-05-29 DIAGNOSIS — R296 Repeated falls: Secondary | ICD-10-CM

## 2015-05-29 DIAGNOSIS — M47814 Spondylosis without myelopathy or radiculopathy, thoracic region: Secondary | ICD-10-CM | POA: Diagnosis not present

## 2015-05-29 DIAGNOSIS — Z13820 Encounter for screening for osteoporosis: Secondary | ICD-10-CM | POA: Diagnosis not present

## 2015-05-29 MED ORDER — PEG 3350-KCL-NABCB-NACL-NASULF 236 G PO SOLR: ORAL | Status: DC

## 2015-05-29 NOTE — Progress Notes (Signed)
Pre-visit discussion using our clinic review tool. No additional management support is needed unless otherwise documented below in the visit note.  

## 2015-05-29 NOTE — Patient Instructions (Signed)
Please go get plain  X Rays of your thoracic spine to rule out a fracture  You can use tylenol up to 2000 mg daily for pain control   Fasting labs are needed,  Letter will order them  For the constipation:  Drink the Golytely every 30 minutes until your stool starts to look like clear liquid  Then start using citrucel or Miralax every night

## 2015-05-29 NOTE — Progress Notes (Signed)
Subjective:  Patient ID: Teresa Lin, female    DOB: [birth year]  Age: 82 y.o. MRN: [id number]  CC: The primary encounter diagnosis was Acute back pain less than 4 weeks duration. Diagnoses of Frequent falls, Constipation, unspecified constipation type, and Screening for osteoporosis were also pertinent to this visit.  HPI Teresa Lin presents for evaluation following a recent fall. She is accompanied by her sister in law Teresa Lin.  The events of the fall are a bit ambiguous,  Patient is not  sure if she 'blacked out" or just saw a black chest of drawers before falling, since she was in a strange hotel room and had gotten up in the middle of the night to walk out a leg cramp.  She recalls falling onto a carpeted floor and striking her right hip on the floor. She has been taking nothing for pain,  But had taken a prednisone taper  for Rheumatoid  flares,  A several weeks ago.   The only medication change was in the timing of MTX injection was changed from  daytime to evening several weeks ago .  The pain has improved but is not completely resolved. The fall occurred a little over a week ago, while en route to visit daughter in [place]  and stopping over in [place]. The pain is focal and involves the mid thoracic spina on the right side .  No buirsing was noted and there is no pleurisy.   Since the fall she has become constipated.  She reports that she had No bowel movement for a week.  Developed severe stomach pains.  Took mg citrate,  Vomited the liquid,  Went to Walk In  [place] done to rule out SBO. advised to use glycerin suppositories, which she has done  x 2 with minimal results.   Outpatient Prescriptions Prior to Visit  Medication Sig Dispense Refill  . aspirin 81 MG tablet Take 81 mg by mouth daily.    . calcium-vitamin D (OSCAL WITH D) 500-200 MG-UNIT per tablet Take 1 tablet by mouth.    . folic acid (FOLVITE) 1 MG tablet Take 1 mg by mouth daily.     .  hydroxychloroquine (PLAQUENIL) 200 MG tablet Take 200 mg by mouth daily.    . Multiple Vitamins-Minerals (MULTIVITAMIN WITH MINERALS) tablet Take 1 tablet by mouth daily.    . methotrexate (RHEUMATREX) 2.5 MG tablet Take 22.5 mg by mouth once a week.     . predniSONE (DELTASONE) 5 MG tablet Take 5 mg by mouth daily. Reported on 05/29/2015     No facility-administered medications prior to visit.    Review of Systems;  Patient denies headache, fevers, malaise, unintentional weight loss, skin rash, eye pain, sinus congestion and sinus pain, sore throat, dysphagia,  hemoptysis , cough, dyspnea, wheezing, chest pain, palpitations, orthopnea, edema, abdominal pain, nausea, melena, diarrhea,, dysuria, hematuria, urinary  Frequency, nocturia, numbness, tingling, seizures,  Focal weakness, Loss of consciousness,  Tremor, insomnia, depression, anxiety, and suicidal ideation.      Objective:  BP 118/66 mmHg  Pulse 89  Temp(Src) 98.1 F (36.7 C) (Oral)  Resp 12  Ht _0  (1.676 m)  Wt 143 lb 4 oz (64.978 kg)  BMI 23.13 kg/m2  SpO2 98%  BP Readings from Last 3 Encounters:  05/29/15 118/66  03/24/15 120/70  09/28/14 114/64    Wt Readings from Last 3 Encounters:  05/29/15 143 lb 4 oz (64.978 kg)  03/24/15 147 lb 12.8  oz (67.042 kg)  09/28/14 144 lb 12 oz (65.658 kg)    General appearance: alert, cooperative and appears stated age Neck: no adenopathy, no carotid bruit, supple, symmetrical, trachea midline and thyroid not enlarged, symmetric, no tenderness/mass/nodules Back: symmetric, no curvature. ROM restricted secondary to pain.  Tender over mid thoracic spine and right paraspinsu muscles,  No bruising.  Marland Kitchen No CVA tenderness. Lungs: clear to auscultation bilaterally Heart: regular rate and rhythm, S1, S2 normal, no murmur, click, rub or gallop Abdomen: soft, non-tender; bowel sounds normal; no masses,  no organomegaly Pulses: 2+ and symmetric Skin: Skin color, texture, turgor normal. No  rashes or lesions  No results found for: HGBA1C  Lab Results  Component Value Date   CREATININE 0.59 06/09/2014   CREATININE 0.7 02/03/2014   CREATININE 0.6 03/10/2013    Lab Results  Component Value Date   WBC 5.4 06/09/2014   HGB 14.2 06/09/2014   HCT 42.5 06/09/2014   PLT 283.0 06/09/2014   GLUCOSE 75 06/09/2014   CHOL 182 02/03/2014   TRIG 65.0 02/03/2014   HDL 67.00 02/03/2014   LDLCALC 102* 02/03/2014   ALT 13 06/09/2014   AST 24 06/09/2014   NA 139 06/09/2014   K 4.6 06/09/2014   CL 103 06/09/2014   CREATININE 0.59 06/09/2014   BUN 12 06/09/2014   CO2 29 06/09/2014   TSH 0.79 02/03/2014   INR 0.9 08/08/2011    Ct Abdomen Pelvis W Contrast  02/10/2015  CLINICAL DATA:  Bilateral lower abdominal pain for 7-10 days. Loose stools this morning. Evaluate for diverticulitis. EXAM: CT ABDOMEN AND PELVIS WITH CONTRAST TECHNIQUE: Multidetector CT imaging of the abdomen and pelvis was performed using the standard protocol following bolus administration of intravenous contrast. CONTRAST:  162m OMNIPAQUE IOHEXOL 300 MG/ML  SOLN COMPARISON:  05/29/2009 FINDINGS: Lower chest: Clear lung bases. Normal heart size without pericardial or pleural effusion. Hepatobiliary: Too small to characterize lateral segment left liver lobe lesions. Cholelithiasis without surrounding inflammation or biliary duct dilatation. Pancreas: Probable pancreas divisum, with a prominent dorsal duct entering the duodenum on image 27 of series 2. No duct dilatation, mass, or acute inflammation. Spleen: Normal in size, without focal abnormality. Adrenals/Urinary Tract: Normal adrenal glands. Bilateral extrarenal pelves. A too small to characterize lesion in the left kidney. Normal urinary bladder. Stomach/Bowel: Normal stomach, without wall thickening. Normal colon and terminal ileum. Normal small bowel. Vascular/Lymphatic: Aortic and branch vessel atherosclerosis. No abdominopelvic adenopathy. Reproductive: Normal  uterus and adnexa. Other: No significant free fluid. Musculoskeletal: Remote right inferior pubic ramus trauma. Mild convex right lumbar spine curvature. Degenerative disc disease involves L4-5 and L5-S1. IMPRESSION: 1. No acute process in the abdomen or pelvis. No evidence of diverticulitis. 2. Cholelithiasis. 3. Probable pancreas divisum.  No acute pancreatitis. Electronically Signed   By: KAbigail MiyamotoM.D.   On: 02/10/2015 14:23    Assessment & Plan:   Problem List Items Addressed This Visit    Frequent falls    I again recommended PT referral for balance and core strengthening exercises  but she declined.   Demonstrated the fundamentals of good balance recommended she take [place] work with a trainer on balance and strengthening of glut muscles.         Acute back pain less than 4 weeks duration - Primary    Thoracic spine films were done to rule out vertebral fracture.  Advised to use heating pad and motrin.       Relevant Orders  DG Thoracic Spine W/Swimmers (Completed)   Constipation    Failing treatment with Mg Citrate and glycerin suppositories,  Trial of GoLytely ,  Followed by nightly use of Citrucel       Screening for osteoporosis    She was advised to ask Dr [facility] to repeat her DEXA,  Originally done at [facility] > 5 years ago .         A total of 40 minutes was spent with patient more than half of which was spent in counseling patient on the above mentioned issues , reviewing and explaining recent labs and imaging studies done, and coordination of care.  I am having Ms. Depaoli start on polyethylene glycol. I am also having her maintain her folic acid, hydroxychloroquine, predniSONE, aspirin, calcium-vitamin D, multivitamin with minerals, and methotrexate.  Meds ordered this encounter  Medications  . methotrexate 50 MG/2ML injection    Sig: Inject into the skin.  . polyethylene glycol (GOLYTELY) 236 g solution    Sig: Drink 8 ounces every 30 minutes until your  constipation is relieved    Dispense:  4000 mL    Refill:  0    Medications Discontinued During This Encounter  Medication Reason  . methotrexate (RHEUMATREX) 2.5 MG tablet Change in therapy    Follow-up: No Follow-up on file.   Crecencio Mc, MD

## 2015-05-30 ENCOUNTER — Telehealth: Payer: Self-pay | Admitting: Internal Medicine

## 2015-05-30 DIAGNOSIS — M810 Age-related osteoporosis without current pathological fracture: Secondary | ICD-10-CM | POA: Insufficient documentation

## 2015-05-30 DIAGNOSIS — K5900 Constipation, unspecified: Secondary | ICD-10-CM | POA: Insufficient documentation

## 2015-05-30 NOTE — Assessment & Plan Note (Signed)
I again recommended PT referral for balance and core strengthening exercises  but she declined.   Demonstrated the fundamentals of good balance recommended she take [place] and work with a trainer on balance and strengthening of glut muscles.

## 2015-05-30 NOTE — Telephone Encounter (Signed)
Notes Recorded by Nanci Pina, LPN on [id number] at [id number] AM Patient staed she is stil sore and cannot be up for long periods that her back hurts patient ask what she should do? Or what is MD recommendations. Results of X-ray given to patient.

## 2015-05-30 NOTE — Telephone Encounter (Signed)
Heating pad for 15 minutes every 6 hours ,  Tylenol 500 mg ever 6 hours,  Motrin 600 mg every 6 hours .  Pt referral if she will go

## 2015-05-30 NOTE — Assessment & Plan Note (Signed)
She was advised to ask Dr [facility] to repeat her DEXA,  Originally done at [facility] > 5 years ago .

## 2015-05-30 NOTE — Assessment & Plan Note (Signed)
Thoracic spine films were done to rule out vertebral fracture.  Advised to use heating pad and motrin.

## 2015-05-30 NOTE — Assessment & Plan Note (Addendum)
Failing treatment with Mg Citrate and glycerin suppositories,  Trial of GoLytely ,  Followed by nightly use of Citrucel

## 2015-05-30 NOTE — Telephone Encounter (Signed)
Unable to reach patient by phone will call back, no voicemail.

## 2015-06-01 DIAGNOSIS — M545 Low back pain: Secondary | ICD-10-CM | POA: Diagnosis not present

## 2015-06-01 DIAGNOSIS — S32020A Wedge compression fracture of second lumbar vertebra, initial encounter for closed fracture: Secondary | ICD-10-CM | POA: Diagnosis not present

## 2015-06-01 NOTE — Telephone Encounter (Signed)
Patient notified and voiced understanding but refused PT referral.

## 2015-06-02 ENCOUNTER — Other Ambulatory Visit: Payer: Self-pay | Admitting: Physician Assistant

## 2015-06-02 DIAGNOSIS — S32020A Wedge compression fracture of second lumbar vertebra, initial encounter for closed fracture: Secondary | ICD-10-CM

## 2015-06-05 ENCOUNTER — Encounter: Payer: Self-pay | Admitting: Internal Medicine

## 2015-06-05 ENCOUNTER — Telehealth: Payer: Self-pay | Admitting: Internal Medicine

## 2015-06-05 ENCOUNTER — Ambulatory Visit
Admission: RE | Admit: 2015-06-05 | Discharge: 2015-06-05 | Disposition: A | Discharge status: Home / Self Care | Payer: Medicare Other | Source: Ambulatory Visit | Attending: Physician Assistant | Admitting: Physician Assistant

## 2015-06-05 DIAGNOSIS — S32020A Wedge compression fracture of second lumbar vertebra, initial encounter for closed fracture: Secondary | ICD-10-CM

## 2015-06-05 NOTE — Telephone Encounter (Signed)
FYI

## 2015-06-05 NOTE — Telephone Encounter (Signed)
Pt called states she is still having discomfort on her back. Pt had another xray done, she has a compression on L2. Pt has an appt today and she will have the results sent. Her appt is at [facility] on [address]. Call pt @ [phone]. Thank you!

## 2015-06-05 NOTE — Telephone Encounter (Signed)
Your message is unclear.  Who ordered the x ray,  And who is her appointment today with?

## 2015-06-05 NOTE — Telephone Encounter (Signed)
Looks like Dr. Rogers Blocker PA for Dr. Marry Guan ordered the MRI and appointment not at this office  Or Dr. Shelby Mattocks his order stated for compression fracture of the L2. FYI

## 2015-06-07 DIAGNOSIS — S32020A Wedge compression fracture of second lumbar vertebra, initial encounter for closed fracture: Secondary | ICD-10-CM | POA: Diagnosis not present

## 2015-06-13 DIAGNOSIS — M659 Synovitis and tenosynovitis, unspecified: Secondary | ICD-10-CM | POA: Diagnosis not present

## 2015-06-13 DIAGNOSIS — M79671 Pain in right foot: Secondary | ICD-10-CM | POA: Diagnosis not present

## 2015-06-13 DIAGNOSIS — M05771 Rheumatoid arthritis with rheumatoid factor of right ankle and foot without organ or systems involvement: Secondary | ICD-10-CM | POA: Diagnosis not present

## 2015-06-14 DIAGNOSIS — M810 Age-related osteoporosis without current pathological fracture: Secondary | ICD-10-CM | POA: Diagnosis not present

## 2015-06-14 DIAGNOSIS — E559 Vitamin D deficiency, unspecified: Secondary | ICD-10-CM | POA: Diagnosis not present

## 2015-06-14 DIAGNOSIS — M79671 Pain in right foot: Secondary | ICD-10-CM | POA: Diagnosis not present

## 2015-06-14 DIAGNOSIS — E785 Hyperlipidemia, unspecified: Secondary | ICD-10-CM | POA: Diagnosis not present

## 2015-06-14 DIAGNOSIS — M0609 Rheumatoid arthritis without rheumatoid factor, multiple sites: Secondary | ICD-10-CM | POA: Diagnosis not present

## 2015-06-14 DIAGNOSIS — R5383 Other fatigue: Secondary | ICD-10-CM | POA: Diagnosis not present

## 2015-06-14 DIAGNOSIS — Z79899 Other long term (current) drug therapy: Secondary | ICD-10-CM | POA: Diagnosis not present

## 2015-06-15 DIAGNOSIS — M0609 Rheumatoid arthritis without rheumatoid factor, multiple sites: Secondary | ICD-10-CM | POA: Diagnosis not present

## 2015-06-15 DIAGNOSIS — R5383 Other fatigue: Secondary | ICD-10-CM | POA: Diagnosis not present

## 2015-06-15 DIAGNOSIS — E785 Hyperlipidemia, unspecified: Secondary | ICD-10-CM | POA: Diagnosis not present

## 2015-06-15 DIAGNOSIS — M810 Age-related osteoporosis without current pathological fracture: Secondary | ICD-10-CM | POA: Diagnosis not present

## 2015-06-15 DIAGNOSIS — E559 Vitamin D deficiency, unspecified: Secondary | ICD-10-CM | POA: Diagnosis not present

## 2015-06-26 DIAGNOSIS — M79671 Pain in right foot: Secondary | ICD-10-CM | POA: Diagnosis not present

## 2015-06-26 DIAGNOSIS — M7989 Other specified soft tissue disorders: Secondary | ICD-10-CM | POA: Diagnosis not present

## 2015-06-26 DIAGNOSIS — M0609 Rheumatoid arthritis without rheumatoid factor, multiple sites: Secondary | ICD-10-CM | POA: Diagnosis not present

## 2015-06-28 ENCOUNTER — Telehealth: Payer: Self-pay | Admitting: Internal Medicine

## 2015-06-28 NOTE — Telephone Encounter (Signed)
Pam [phone] called from [name] to check the status if we received lab fax on 06/19/2015.  Lab dates are collected on 2/2 and resulted on 06/16/15. Thank you!

## 2015-06-28 NOTE — Telephone Encounter (Signed)
NO not received.

## 2015-06-28 NOTE — Telephone Encounter (Signed)
Please advise if this was in Dr. Lupita Dawn mailbox.

## 2015-07-04 NOTE — Telephone Encounter (Signed)
Your CBC, thyroid, cholesterol,  liver and kidney function are normal.  Your ESR is elevated due to rheumatoid arthritis .  Your Bone Density scores have been reviewed  appears that you have had osteoporosis and have had it since 2011, when your previous DEXA scan was done by Dr [facility].  I recommend treating with medication but would need to discuss the pros and cons of starting medications this early, along with  the various choices that are currently available .  For now I would continue to work at getting a minimum  calcium intake of 1200 to 1800 mg daily through diet and supplements ,  And 2000 units of vitamin d daily as well.  weight bearing exercise on a regular basis, has been shown to be helpful as well.  If you would like to discuss pharmacotherapy, please  Make an  appt to discuss .  Her CMET, lipids and TSH and CBC need to be abstracted. Can she print them out for [place]?

## 2015-07-04 NOTE — Telephone Encounter (Signed)
Labs are viewable through Care everywhere if done at [facility]. Patient would like MD to view labs in Care Every where from [facility]. Labs and a bone density done 06/15/15

## 2015-07-06 NOTE — Telephone Encounter (Signed)
Left message to call office

## 2015-07-10 DIAGNOSIS — M545 Low back pain: Secondary | ICD-10-CM | POA: Diagnosis not present

## 2015-07-10 DIAGNOSIS — S32020S Wedge compression fracture of second lumbar vertebra, sequela: Secondary | ICD-10-CM | POA: Diagnosis not present

## 2015-07-11 LAB — HM DEXA SCAN

## 2015-07-11 NOTE — Telephone Encounter (Signed)
Left message for patient to return my call.

## 2015-07-14 ENCOUNTER — Encounter: Payer: Self-pay | Admitting: *Deleted

## 2015-07-14 NOTE — Telephone Encounter (Signed)
Tried several times to reach patient by phone and no return call has been made by patient. Mailed patient letter with results.

## 2015-08-07 ENCOUNTER — Encounter: Payer: Self-pay | Admitting: Internal Medicine

## 2015-08-07 ENCOUNTER — Ambulatory Visit (INDEPENDENT_AMBULATORY_CARE_PROVIDER_SITE_OTHER): Payer: Medicare Other | Admitting: Internal Medicine

## 2015-08-07 VITALS — BP 138/64 | HR 85 | Temp 97.7°F | Resp 12 | Ht 66.0 in | Wt 144.2 lb

## 2015-08-07 DIAGNOSIS — M79671 Pain in right foot: Secondary | ICD-10-CM | POA: Diagnosis not present

## 2015-08-07 DIAGNOSIS — R296 Repeated falls: Secondary | ICD-10-CM

## 2015-08-07 DIAGNOSIS — M659 Synovitis and tenosynovitis, unspecified: Secondary | ICD-10-CM | POA: Diagnosis not present

## 2015-08-07 DIAGNOSIS — S32020G Wedge compression fracture of second lumbar vertebra, subsequent encounter for fracture with delayed healing: Secondary | ICD-10-CM | POA: Diagnosis not present

## 2015-08-07 DIAGNOSIS — R531 Weakness: Secondary | ICD-10-CM | POA: Diagnosis not present

## 2015-08-07 DIAGNOSIS — L97919 Non-pressure chronic ulcer of unspecified part of right lower leg with unspecified severity: Secondary | ICD-10-CM

## 2015-08-07 DIAGNOSIS — M810 Age-related osteoporosis without current pathological fracture: Secondary | ICD-10-CM

## 2015-08-07 DIAGNOSIS — I83019 Varicose veins of right lower extremity with ulcer of unspecified site: Secondary | ICD-10-CM

## 2015-08-07 DIAGNOSIS — M05771 Rheumatoid arthritis with rheumatoid factor of right ankle and foot without organ or systems involvement: Secondary | ICD-10-CM | POA: Diagnosis not present

## 2015-08-07 DIAGNOSIS — I878 Other specified disorders of veins: Secondary | ICD-10-CM | POA: Diagnosis not present

## 2015-08-07 NOTE — Progress Notes (Signed)
Pre-visit discussion using our clinic review tool. No additional management support is needed unless otherwise documented below in the visit note.  

## 2015-08-07 NOTE — Progress Notes (Signed)
Subjective:  Patient ID: Teresa Lin, female    DOB: [birth year]  Age: 82 y.o. MRN: [id number]  CC: The primary encounter diagnosis was Strength loss of. Diagnoses of Compression fracture of L2 lumbar vertebra with delayed healing, Frequent falls, Osteoporosis, postmenopausal, and Venous stasis ulcer of right lower extremity ([place]) were also pertinent to this visit.  HPI Teresa Lin presents for follow up on orthopedic issues.  She was diagnosed with L3 vertebral compression fracture by Orthooedics after presenting with low back pain after a fall  that occurred while walking around an unfamiliar  hotel room in January.  AN MRI done Jan 29 showed 25% compression fracture of L2 with 2 mm posterior  bony retropulsion,.  She was had several visits with Estella Husk to consider kyphoplasty which has been deferred.  She continues to wear a supportive back brace and was  advised to avoid lifting > 10 lbs at her last visit in mid Feb to prevent additional damage during the healing process. MRi also showed spondylosis  and degenerative  changes causing mild impingement at L4 and l5 causing mild balteral foraminal stenosis.he is concerned that her legs have  Become weak since she has had no purposeful exercise since January and wants to resume with PT>   2) Rght ankle pain for the last 2 months  Saw podiatry today and injections were offered, but she declined since Dr Jefm Bryant had given her an injection last month in a different joint. .  Degenerative changes in the 2nd and 3rd MT/cuneifomr joints were noted on prior films and cited as the cause for her midfoot pain which is accompanied by midl fluid retention, both of  Which are more pronounced by the end of the day.     Outpatient Prescriptions Prior to Visit  Medication Sig Dispense Refill  . aspirin 81 MG tablet Take 81 mg by mouth daily.    . calcium-vitamin D (OSCAL WITH D) 500-200 MG-UNIT per tablet Take 1 tablet by mouth.    . folic acid (FOLVITE) 1 MG  tablet Take 1 mg by mouth daily.     . hydroxychloroquine (PLAQUENIL) 200 MG tablet Take 200 mg by mouth daily.    . methotrexate 50 MG/2ML injection Inject into the skin.    . Multiple Vitamins-Minerals (MULTIVITAMIN WITH MINERALS) tablet Take 1 tablet by mouth daily.    . polyethylene glycol (GOLYTELY) 236 g solution Drink 8 ounces every 30 minutes until your constipation is relieved 4000 mL 0  . predniSONE (DELTASONE) 5 MG tablet Take 5 mg by mouth daily. Reported on 08/07/2015     No facility-administered medications prior to visit.    Review of Systems;  Patient denies headache, fevers, malaise, unintentional weight loss, skin rash, eye pain, sinus congestion and sinus pain, sore throat, dysphagia,  hemoptysis , cough, dyspnea, wheezing, chest pain, palpitations, orthopnea, edema, abdominal pain, nausea, melena, diarrhea, constipation, flank pain, dysuria, hematuria, urinary  Frequency, nocturia, numbness, tingling, seizures,  Focal weakness, Loss of consciousness,  Tremor, insomnia, depression, anxiety, and suicidal ideation.      Objective:  BP 138/64 mmHg  Pulse 85  Temp(Src) 97.7 F (36.5 C) (Oral)  Resp 12  Ht 5\' 6"  (1.676 m)  Wt 144 lb 4 oz (65.431 kg)  BMI 23.29 kg/m2  SpO2 98%  BP Readings from Last 3 Encounters:  08/07/15 138/64  05/29/15 118/66  03/24/15 120/70    Wt Readings from Last 3 Encounters:  08/07/15 144 lb 4 oz (65.431  kg)  06/05/15 143 lb (64.864 kg)  05/29/15 143 lb 4 oz (64.978 kg)    General appearance: alert, cooperative and appears stated age Ears: normal TM's and external ear canals both ears Throat: lips, mucosa, and tongue normal; teeth and gums normal Neck: no adenopathy, no carotid bruit, supple, symmetrical, trachea midline and thyroid not enlarged, symmetric, no tenderness/mass/nodules Back: symmetric, no curvature. ROM normal. No CVA tenderness. Lungs: clear to auscultation bilaterally Heart: regular rate and rhythm, S1, S2 normal, no  murmur, click, rub or gallop Abdomen: soft, non-tender; bowel sounds normal; no masses,  no organomegaly Pulses: 2+ and symmetric Skin: Skin color, texture, turgor normal. No rashes or lesions Lymph nodes: Cervical, supraclavicular, and axillary nodes normal.  No results found for: HGBA1C  Lab Results  Component Value Date   CREATININE 0.59 06/09/2014   CREATININE 0.7 02/03/2014   CREATININE 0.6 03/10/2013    Lab Results  Component Value Date   WBC 5.4 06/09/2014   HGB 14.2 06/09/2014   HCT 42.5 06/09/2014   PLT 283.0 06/09/2014   GLUCOSE 75 06/09/2014   CHOL 182 02/03/2014   TRIG 65.0 02/03/2014   HDL 67.00 02/03/2014   LDLCALC 102* 02/03/2014   ALT 13 06/09/2014   AST 24 06/09/2014   NA 139 06/09/2014   K 4.6 06/09/2014   CL 103 06/09/2014   CREATININE 0.59 06/09/2014   BUN 12 06/09/2014   CO2 29 06/09/2014   TSH 0.79 02/03/2014   INR 0.9 08/08/2011    Mr Lumbar Spine W Wo Contrast  06/05/2015  CLINICAL DATA:  L2 compression fracture, initial encounter. Rheumatoid arthritis. Right foot cramping. Right low back pain for 2 weeks after a fall. EXAM: MRI LUMBAR SPINE WITHOUT AND WITH CONTRAST TECHNIQUE: Multiplanar and multiecho pulse sequences of the lumbar spine were obtained without and with intravenous contrast. CONTRAST:  13 cc MultiHance Creatinine was obtained on site at [facility] at [address]Results: Creatinine 0.6 mg/dL. COMPARISON:  02/10/2015 FINDINGS: The lowest lumbar type non-rib-bearing vertebra is labeled as L5. The conus medullaris appears normal. Conus level: L1. Small cyst in the left kidney lower pole. Mild dextroconvex lumbar rotary scoliosis. 25% superior endplate compression fracture of L2 with a subcortical transverse fracture plane extending to the posterior superior vertebral margin as well as surrounding edema. 2 mm posterior bony retropulsion. No other significant abnormal lumbar spine enhancement or edema noted. Disc desiccation is  present throughout the lumbar spine except with relative sparing of L1- 2. Additional findings at individual levels are as follows: L1-2: No impingement. Mild disc bulge and mild posterior bony retropulsion from the L2 superior endplate compression. L2-3:  No impingement.  Diffuse disc bulge.  Mild facet arthropathy. L3-4: Borderline left foraminal stenosis and borderline left subarticular lateral recess stenosis due to disc bulge and left greater than right facet arthropathy. L4-5: Mild right and borderline left foraminal stenosis and borderline right subarticular lateral recess stenosis due to disc bulge, intervertebral spurring, and facet arthropathy. L5-S1: Mild left and borderline right subarticular lateral recess stenosis with mild bilateral foraminal stenosis due to facet spurring, intervertebral spurring, and disc bulge. IMPRESSION: 1. 25% benign appearing superior endplate compression fracture of L2, likely subacute. There is 2 mm of associated bony retropulsion, not causing impingement. 2. Lumbar spondylosis and degenerative disc disease cause mild impingement at L4-5 and L5-S1 as detailed above. 3. Dextroconvex lumbar rotary scoliosis. Electronically Signed   By: Van Clines M.D.   On: 06/05/2015 15:19    Assessment &  Plan:   Problem List Items Addressed This Visit    Frequent falls    Aggravated by progressive loss of strength in legs due to inactivity.  Agree with PT referral       Compression fracture of L2 lumbar vertebra with delayed healing    vertebroplasty discussed and not advised. Pain is resolving with supportive care .  Continue back brace use       Osteoporosis, postmenopausal    DEXA repeated after vertebral fracture.  At [facility] clinic ,  Dr Jefm Bryant to advise therapy.       Relevant Medications   alendronate (FOSAMAX) 70 MG tablet   Venous stasis ulcer of right lower extremity ([facility])    Etiology and management discussed with patient.  Advised to use compression   Knee high on right leg        Other Visit Diagnoses    Strength loss of    -  Primary    Relevant Orders    Ambulatory referral to Physical Therapy      A total of 40 minutes of face to face time was spent with patient more than half of which was spent in counselling about the above mentioned conditions  and coordination of care  I am having Ms. Bridwell maintain her folic acid, hydroxychloroquine, predniSONE, aspirin, calcium-vitamin D, multivitamin with minerals, methotrexate, polyethylene glycol, and alendronate.  Meds ordered this encounter  Medications  . alendronate (FOSAMAX) 70 MG tablet    Sig: Take by mouth.    There are no discontinued medications.  Follow-up: No Follow-up on file.   Crecencio Mc, MD

## 2015-08-07 NOTE — Patient Instructions (Signed)
You can get a better variety of compression knee highs on [email or web address] . Go with a light amount of compression.    Venous Stasis or Chronic Venous Insufficiency Chronic venous insufficiency, also called venous stasis, is a condition that affects the veins in the legs. The condition prevents blood from being pumped through these veins effectively. Blood may no longer be pumped effectively from the legs back to the heart. This condition can range from mild to severe. With proper treatment, you should be able to continue with an active life. CAUSES  Chronic venous insufficiency occurs when the vein walls become stretched, weakened, or damaged or when valves within the vein are damaged. Some common causes of this include:  High blood pressure inside the veins (venous hypertension).  Increased blood pressure in the leg veins from long periods of sitting or standing.  A blood clot that blocks blood flow in a vein (deep vein thrombosis).  Inflammation of a superficial vein (phlebitis) that causes a blood clot to form. RISK FACTORS Various things can make you more likely to develop chronic venous insufficiency, including:  Family history of this condition.  Obesity.  Pregnancy.  Sedentary lifestyle.  Smoking.  Jobs requiring long periods of standing or sitting in one place.  Being a certain age. Women in their 27s and 58s and men in their 19s are more likely to develop this condition. SIGNS AND SYMPTOMS  Symptoms may include:   Varicose veins.  Skin breakdown or ulcers.  Reddened or discolored skin on the leg.  Brown, smooth, tight, and painful skin just above the ankle, usually on the inside surface (lipodermatosclerosis).  Swelling. DIAGNOSIS  To diagnose this condition, your health care provider will take a medical history and do a physical exam. The following tests may be ordered to confirm the diagnosis:  Duplex ultrasound--A procedure that produces a picture of a blood  vessel and nearby organs and also provides information on blood flow through the blood vessel.  Plethysmography--A procedure that tests blood flow.  A venogram, or venography--A procedure used to look at the veins using X-ray and dye. TREATMENT The goals of treatment are to help you return to an active life and to minimize pain or disability. Treatment will depend on the severity of the condition. Medical procedures may be needed for severe cases. Treatment options may include:   Use of compression stockings. These can help with symptoms and lower the chances of the problem getting worse, but they do not cure the problem.  Sclerotherapy--A procedure involving an injection of a material that "dissolves" the damaged veins. Other veins in the network of blood vessels take over the function of the damaged veins.  Surgery to remove the vein or cut off blood flow through the vein (vein stripping or laser ablation surgery).  Surgery to repair a valve. HOME CARE INSTRUCTIONS   Wear compression stockings as directed by your health care provider.  Only take over-the-counter or prescription medicines for pain, discomfort, or fever as directed by your health care provider.  Follow up with your health care provider as directed. SEEK MEDICAL CARE IF:   You have redness, swelling, or increasing pain in the affected area.  You see a red streak or line that extends up or down from the affected area.  You have a breakdown or loss of skin in the affected area, even if the breakdown is small.  You have an injury to the affected area. SEEK IMMEDIATE MEDICAL CARE IF:   You have  an injury and open wound in the affected area.  Your pain is severe and does not improve with medicine.  You have sudden numbness or weakness in the foot or ankle below the affected area, or you have trouble moving your foot or ankle.  You have a fever or persistent symptoms for more than 2-3 days.  You have a fever and  your symptoms suddenly get worse. MAKE SURE YOU:   Understand these instructions.  Will watch your condition.  Will get help right away if you are not doing well or get worse.   This information is not intended to replace advice given to you by your health care provider. Make sure you discuss any questions you have with your health care provider.   Document Released: 09/02/2006 Document Revised: 02/17/2013 Document Reviewed: 01/04/2013 Elsevier Interactive Patient Education [employer].

## 2015-08-08 DIAGNOSIS — L97919 Non-pressure chronic ulcer of unspecified part of right lower leg with unspecified severity: Secondary | ICD-10-CM | POA: Insufficient documentation

## 2015-08-08 DIAGNOSIS — I83019 Varicose veins of right lower extremity with ulcer of unspecified site: Secondary | ICD-10-CM | POA: Insufficient documentation

## 2015-08-08 NOTE — Assessment & Plan Note (Addendum)
Etiology and management discussed with patient.  Advised to use compression  Knee high on right leg

## 2015-08-08 NOTE — Assessment & Plan Note (Addendum)
vertebroplasty discussed and not advised. Pain is resolving with supportive care .  Continue back brace use

## 2015-08-08 NOTE — Assessment & Plan Note (Signed)
DEXA repeated after vertebral fracture.  At [facility] clinic ,  Dr Jefm Bryant to advise therapy.

## 2015-08-08 NOTE — Assessment & Plan Note (Addendum)
Aggravated by progressive loss of strength in legs due to inactivity.  Agree with PT referral

## 2015-08-12 DIAGNOSIS — S32000D Wedge compression fracture of unspecified lumbar vertebra, subsequent encounter for fracture with routine healing: Secondary | ICD-10-CM | POA: Diagnosis not present

## 2015-08-12 DIAGNOSIS — M6281 Muscle weakness (generalized): Secondary | ICD-10-CM | POA: Diagnosis not present

## 2015-08-16 DIAGNOSIS — S32000D Wedge compression fracture of unspecified lumbar vertebra, subsequent encounter for fracture with routine healing: Secondary | ICD-10-CM | POA: Diagnosis not present

## 2015-08-16 DIAGNOSIS — M6281 Muscle weakness (generalized): Secondary | ICD-10-CM | POA: Diagnosis not present

## 2015-08-21 DIAGNOSIS — M6281 Muscle weakness (generalized): Secondary | ICD-10-CM | POA: Diagnosis not present

## 2015-08-21 DIAGNOSIS — S32000D Wedge compression fracture of unspecified lumbar vertebra, subsequent encounter for fracture with routine healing: Secondary | ICD-10-CM | POA: Diagnosis not present

## 2015-08-24 ENCOUNTER — Telehealth: Payer: Self-pay | Admitting: *Deleted

## 2015-08-24 DIAGNOSIS — M6281 Muscle weakness (generalized): Secondary | ICD-10-CM | POA: Diagnosis not present

## 2015-08-24 DIAGNOSIS — S32000D Wedge compression fracture of unspecified lumbar vertebra, subsequent encounter for fracture with routine healing: Secondary | ICD-10-CM | POA: Diagnosis not present

## 2015-08-24 NOTE — Telephone Encounter (Signed)
There is a whole slew of labs that I ordered in November.  Please tell patient that I prefer her to have the labs done here.  i cannot order labs to be done there .  It is extra work because a doctor from [place] has to order them and they do not like  to do that . And I am out of the office currently

## 2015-08-24 NOTE — Telephone Encounter (Signed)
Spoke with patient on phone and set up appointment to do her labs here on Monday 4-17 at 8:15

## 2015-08-24 NOTE — Telephone Encounter (Signed)
Patient wanted to make sure that [facility] clinic was notified of all the blood work that was needed by Dr. Derrel Nip, before her appt on 08/30/15. Patient will have labs drawn at [facility] on Friday or Tuesday morning.  Pt contact [phone]

## 2015-08-28 ENCOUNTER — Other Ambulatory Visit (INDEPENDENT_AMBULATORY_CARE_PROVIDER_SITE_OTHER): Payer: Medicare Other

## 2015-08-28 DIAGNOSIS — E785 Hyperlipidemia, unspecified: Secondary | ICD-10-CM | POA: Diagnosis not present

## 2015-08-28 DIAGNOSIS — M6281 Muscle weakness (generalized): Secondary | ICD-10-CM | POA: Diagnosis not present

## 2015-08-28 DIAGNOSIS — R296 Repeated falls: Secondary | ICD-10-CM | POA: Diagnosis not present

## 2015-08-28 DIAGNOSIS — R29818 Other symptoms and signs involving the nervous system: Secondary | ICD-10-CM

## 2015-08-28 DIAGNOSIS — R2689 Other abnormalities of gait and mobility: Secondary | ICD-10-CM

## 2015-08-28 DIAGNOSIS — Z79899 Other long term (current) drug therapy: Secondary | ICD-10-CM

## 2015-08-28 DIAGNOSIS — S32000D Wedge compression fracture of unspecified lumbar vertebra, subsequent encounter for fracture with routine healing: Secondary | ICD-10-CM | POA: Diagnosis not present

## 2015-08-28 LAB — COMPREHENSIVE METABOLIC PANEL
ALT: 16 U/L (ref 0–35)
AST: 26 U/L (ref 0–37)
Albumin: 4.1 g/dL (ref 3.5–5.2)
Alkaline Phosphatase: 44 U/L (ref 39–117)
BUN: 12 mg/dL (ref 6–23)
CO2: 29 mEq/L (ref 19–32)
Calcium: 9.5 mg/dL (ref 8.4–10.5)
Chloride: 105 mEq/L (ref 96–112)
Creatinine, Ser: 0.58 mg/dL (ref 0.40–1.20)
GFR: 105.87 mL/min (ref >60.00)
Glucose, Bld: 83 mg/dL (ref 70–99)
Potassium: 4.2 mEq/L (ref 3.5–5.1)
Sodium: 143 mEq/L (ref 135–145)
Total Bilirubin: 0.5 mg/dL (ref 0.2–1.2)
Total Protein: 6.9 g/dL (ref 6.0–8.3)

## 2015-08-28 LAB — CBC WITH DIFFERENTIAL/PLATELET
Basophils Absolute: 0 10*3/uL (ref 0.0–0.1)
Basophils Relative: 0.1 % (ref 0.0–3.0)
Eosinophils Absolute: 0.2 10*3/uL (ref 0.0–0.7)
Eosinophils Relative: 3.1 % (ref 0.0–5.0)
HCT: 40.5 % (ref 36.0–46.0)
Hemoglobin: 13.4 g/dL (ref 12.0–15.0)
Lymphocytes Relative: 19.6 % (ref 12.0–46.0)
Lymphs Abs: 1.2 10*3/uL (ref 0.7–4.0)
MCHC: 33.1 g/dL (ref 30.0–36.0)
MCV: 96 fl (ref 78.0–100.0)
Monocytes Absolute: 0.5 10*3/uL (ref 0.1–1.0)
Monocytes Relative: 7.8 % (ref 3.0–12.0)
Neutro Abs: 4.3 10*3/uL (ref 1.4–7.7)
Neutrophils Relative %: 69.4 % (ref 43.0–77.0)
Platelets: 306 10*3/uL (ref 150.0–400.0)
RBC: 4.22 Mil/uL (ref 3.87–5.11)
RDW: 15.1 % (ref 11.5–15.5)
WBC: 6.2 10*3/uL (ref 4.0–10.5)

## 2015-08-28 LAB — VITAMIN D 25 HYDROXY (VIT D DEFICIENCY, FRACTURES): VITD: 36.71 ng/mL (ref 30.00–100.00)

## 2015-08-28 LAB — LIPID PANEL
Cholesterol: 181 mg/dL (ref 0–200)
HDL: 62.9 mg/dL (ref >39.00)
LDL Cholesterol: 102 mg/dL — ABNORMAL HIGH (ref 0–99)
NonHDL: 118.57
Total CHOL/HDL Ratio: 3
Triglycerides: 83 mg/dL (ref 0.0–149.0)
VLDL: 16.6 mg/dL (ref 0.0–40.0)

## 2015-08-28 LAB — TSH: TSH: 2.04 u[IU]/mL (ref 0.35–4.50)

## 2015-08-28 LAB — VITAMIN B12: Vitamin B-12: 289 pg/mL (ref 211–911)

## 2015-08-30 ENCOUNTER — Ambulatory Visit (INDEPENDENT_AMBULATORY_CARE_PROVIDER_SITE_OTHER): Payer: Medicare Other | Admitting: Internal Medicine

## 2015-08-30 ENCOUNTER — Encounter: Payer: Self-pay | Admitting: Internal Medicine

## 2015-08-30 VITALS — BP 114/60 | HR 92 | Temp 97.5°F | Resp 12 | Ht 67.0 in | Wt 144.8 lb

## 2015-08-30 DIAGNOSIS — Z1239 Encounter for other screening for malignant neoplasm of breast: Secondary | ICD-10-CM | POA: Diagnosis not present

## 2015-08-30 DIAGNOSIS — S32000D Wedge compression fracture of unspecified lumbar vertebra, subsequent encounter for fracture with routine healing: Secondary | ICD-10-CM | POA: Diagnosis not present

## 2015-08-30 DIAGNOSIS — Z0000 Encounter for general adult medical examination without abnormal findings: Secondary | ICD-10-CM

## 2015-08-30 DIAGNOSIS — M6281 Muscle weakness (generalized): Secondary | ICD-10-CM | POA: Diagnosis not present

## 2015-08-30 NOTE — Progress Notes (Signed)
Patient ID: Teresa Lin, female    DOB: 03-Feb-1932  Age: 82 y.o. MRN: [id number]  The patient is here for annual Medicare wellness examination and management of other chronic and acute problems.    FH reviewed:  Sister has CAD diagnosed at age 56 during workup for fatigue  Mother had colon CA , was removed with resection.  No recurrence  The risk factors are reflected in the social history.  The roster of all physicians providing medical care to patient - is listed in the Snapshot section of the chart.  Activities of daily living:  The patient is 100% independent in all ADLs: dressing, toileting, feeding as well as independent mobility  Home safety : The patient has smoke detectors in the home. They wear seatbelts.  There are no firearms at home. There is no violence in the home.   There is no risks for hepatitis, STDs or HIV. There is no   history of blood transfusion. They have no travel history to infectious disease endemic areas of the world.  The patient has seen their dentist in the last six month. They have seen their eye doctor in the last year. They admit to slight hearing difficulty with regard to whispered voices and some television programs.  They have deferred audiologic testing in the last year.  They do not  have excessive sun exposure. Discussed the need for sun protection: hats, long sleeves and use of sunscreen if there is significant sun exposure.   Diet: the importance of a healthy diet is discussed. They do have a healthy diet.  The benefits of regular aerobic exercise were discussed. [facility] , but has not done so since her fall and lumbar fracture.  . Getting PT currently , but not doing the exercise at home.    Depression screen: there are no signs or vegative symptoms of depression- irritability, change in appetite, anhedonia, sadness/tearfullness.  Cognitive assessment: the patient manages all their financial and  personal affairs and is actively engaged. They could relate day,date,year and events; recalled 2/3 objects at 3 minutes; performed clock-face test normally.  The following portions of the patient's history were reviewed and updated as appropriate: allergies, current medications, past family history, past medical history,  past surgical history, past social history  and problem list.  Visual acuity was not assessed per patient preference since she has regular follow up with her ophthalmologist. Hearing and body mass index were assessed and reviewed.   During the course of the visit the patient was educated and counseled about appropriate screening and preventive services including : fall prevention , diabetes screening, nutrition counseling, colorectal cancer screening, and recommended immunizations.    CC: The primary encounter diagnosis was Breast cancer screening. A diagnosis of Medicare annual wellness visit, subsequent was also pertinent to this visit.  Has bladder incontinence mixed type, does not want medications. After review of options.     Seen less than one month ago after a vertebral compression fracture L2-3.  Recent labs reviewed; all normal including D.  DEXA scan Feb 2017  Osteoporosis no change from 2011 . Dr Jefm Bryant has recommendeed alendronate vs Reclast      Allergies  Allergen Reactions  . Infliximab Other (See Comments)    Shaking, pain  . Synvisc [Hylan G-F 20] Swelling   normq   History Shakierra has a past medical history of Arthritis; Phlebitis and thrombophlebitis of deep veins of lower  extremities ([place]); Colon polyps; and Collagen vascular disease ([place]).   She has past surgical history that includes Appendectomy and Joint replacement (Bilateral).   Her family history includes Cancer in her mother; Heart disease in her father and mother; Hypertension in her father, mother, and sister; Multiple sclerosis in her daughter, son, and son.She reports that she has never  smoked. She has never used smokeless tobacco. She reports that she drinks about 1.2 oz of alcohol per week. She reports that she does not use illicit drugs.  Outpatient Prescriptions Prior to Visit  Medication Sig Dispense Refill  . alendronate (FOSAMAX) 70 MG tablet Take by mouth.    Marland Kitchen aspirin 81 MG tablet Take 81 mg by mouth daily.    . calcium-vitamin D (OSCAL WITH D) 500-200 MG-UNIT per tablet Take 1 tablet by mouth.    . folic acid (FOLVITE) 1 MG tablet Take 1 mg by mouth daily.     . hydroxychloroquine (PLAQUENIL) 200 MG tablet Take 200 mg by mouth daily.    . methotrexate 50 MG/2ML injection Inject into the skin.    . Multiple Vitamins-Minerals (MULTIVITAMIN WITH MINERALS) tablet Take 1 tablet by mouth daily.    . polyethylene glycol (GOLYTELY) 236 g solution Drink 8 ounces every 30 minutes until your constipation is relieved (Patient not taking: Reported on 08/30/2015) 4000 mL 0  . predniSONE (DELTASONE) 5 MG tablet Take 5 mg by mouth daily. Reported on 08/30/2015     No facility-administered medications prior to visit.    Review of Systems   Patient denies headache, fevers, malaise, unintentional weight loss, skin rash, eye pain, sinus congestion and sinus pain, sore throat, dysphagia,  hemoptysis , cough, dyspnea, wheezing, chest pain, palpitations, orthopnea, edema, abdominal pain, nausea, melena, diarrhea, constipation, flank pain, dysuria, hematuria, urinary  Frequency, nocturia, numbness, tingling, seizures,  Focal weakness, Loss of consciousness,  Tremor, insomnia, depression, anxiety, and suicidal ideation.      Objective:  BP 114/60 mmHg  Pulse 92  Temp(Src) 97.5 F (36.4 C) (Oral)  Resp 12  Ht 5\' 7"  (1.702 m)  Wt 144 lb 12 oz (65.658 kg)  BMI 22.67 kg/m2  SpO2 98%  Physical Exam   General appearance: alert, cooperative and appears stated age Head: Normocephalic, without obvious abnormality, atraumatic Eyes: conjunctivae/corneas clear. PERRL, EOM's intact. Fundi  benign. Ears: normal TM's and external ear canals both ears Nose: Nares normal. Septum midline. Mucosa normal. No drainage or sinus tenderness. Throat: lips, mucosa, and tongue normal; teeth and gums normal Neck: no adenopathy, no carotid bruit, no JVD, supple, symmetrical, trachea midline and thyroid not enlarged, symmetric, no tenderness/mass/nodules Lungs: clear to auscultation bilaterally Breasts: normal appearance, no masses or tenderness Heart: regular rate and rhythm, S1, S2 normal, no murmur, click, rub or gallop Abdomen: soft, non-tender; bowel sounds normal; no masses,  no organomegaly Extremities: extremities normal, atraumatic, no cyanosis or edema Pulses: 2+ and symmetric Skin: Skin color, texture, turgor normal. No rashes or lesions Neurologic: Alert and oriented X 3, normal strength and tone. Normal symmetric reflexes. Normal coordination and gait.     Assessment & Plan:   Problem List Items Addressed This Visit    Medicare annual wellness visit, subsequent    Annual Medicare wellness  exam was done as well as a comprehensive physical exam and management of acute and chronic conditions .  During the course of the visit the patient was educated and counseled about appropriate screening and preventive services including : fall prevention , diabetes screening,  nutrition counseling, colorectal cancer screening, and recommended immunizations.  Printed recommendations for health maintenance screenings was given.        Other Visit Diagnoses    Breast cancer screening    -  Primary    Relevant Orders    MM DIGITAL SCREENING BILATERAL       I am having Ms. Collington maintain her folic acid, hydroxychloroquine, predniSONE, aspirin, calcium-vitamin D, multivitamin with minerals, methotrexate, polyethylene glycol, and alendronate.  No orders of the defined types were placed in this encounter.    There are no discontinued medications.  Follow-up: Return in about 3 months (around  11/29/2015).   Crecencio Mc, MD

## 2015-08-30 NOTE — Patient Instructions (Addendum)
You might want to try a premixed protein drink called Premier Protein shake in the morning.  It is less $$$ than Boost and very low sugar/high protein .    160 cal  30 g protein  1 g sugar 50% calcium needs    You may want to try using Beano before meals with beans or cruciferous vegetables ,  And Lactase with any meal that has dairy   Your next colonoscopy is due  In 2018,  Per Dr. Vira Agar   Mammogram has been ordered   I do recommend getting the  [facility] screening fair  because it is well worth the $200 and will include carotid artery dopplers,   Ekg,  And assessment of your leg circulation   Menopause is a normal process in which your reproductive ability comes to an end. This process happens gradually over a span of months to years, usually between the ages of 31 and 82. Menopause is complete when you have missed 12 consecutive menstrual periods. It is important to talk with your health care provider about some of the most common conditions that affect postmenopausal women, such as heart disease, cancer, and bone loss (osteoporosis). Adopting a healthy lifestyle and getting preventive care can help to promote your health and wellness. Those actions can also lower your chances of developing some of these common conditions. WHAT SHOULD I KNOW ABOUT MENOPAUSE? During menopause, you may experience a number of symptoms, such as:  Moderate-to-severe hot flashes.  Night sweats.  Decrease in sex drive.  Mood swings.  Headaches.  Tiredness.  Irritability.  Memory problems.  Insomnia. Choosing to treat or not to treat menopausal changes is an individual decision that you make with your health care provider. WHAT SHOULD I KNOW ABOUT HORMONE REPLACEMENT THERAPY AND SUPPLEMENTS? Hormone therapy products are effective for treating symptoms that are associated with menopause, such as hot flashes and night sweats. Hormone replacement carries certain risks, especially as you become older. If  you are thinking about using estrogen or estrogen with progestin treatments, discuss the benefits and risks with your health care provider. WHAT SHOULD I KNOW ABOUT HEART DISEASE AND STROKE? Heart disease, heart attack, and stroke become more likely as you age. This may be due, in part, to the hormonal changes that your body experiences during menopause. These can affect how your body processes dietary fats, triglycerides, and cholesterol. Heart attack and stroke are both medical emergencies. There are many things that you can do to help prevent heart disease and stroke:  Have your blood pressure checked at least every 1-2 years. High blood pressure causes heart disease and increases the risk of stroke.  If you are 84-12 years old, ask your health care provider if you should take aspirin to prevent a heart attack or a stroke.  Do not use any tobacco products, including cigarettes, chewing tobacco, or electronic cigarettes. If you need help quitting, ask your health care provider.  It is important to eat a healthy diet and maintain a healthy weight.  Be sure to include plenty of vegetables, fruits, low-fat dairy products, and lean protein.  Avoid eating foods that are high in solid fats, added sugars, or salt (sodium).  Get regular exercise. This is one of the most important things that you can do for your health.  Try to exercise for at least 150 minutes each week. The type of exercise that you do should increase your heart rate and make you sweat. This is known as moderate-intensity exercise.  Try to do strengthening exercises at least twice each week. Do these in addition to the moderate-intensity exercise.  Know your numbers.Ask your health care provider to check your cholesterol and your blood glucose. Continue to have your blood tested as directed by your health care provider. WHAT SHOULD I KNOW ABOUT CANCER SCREENING? There are several types of cancer. Take the following steps to  reduce your risk and to catch any cancer development as early as possible. Breast Cancer  Practice breast self-awareness.  This means understanding how your breasts normally appear and feel.  It also means doing regular breast self-exams. Let your health care provider know about any changes, no matter how small.  If you are 2 or older, have a clinician do a breast exam (clinical breast exam or CBE) every year. Depending on your age, family history, and medical history, it may be recommended that you also have a yearly breast X-ray (mammogram).  If you have a family history of breast cancer, talk with your health care provider about genetic screening.  If you are at high risk for breast cancer, talk with your health care provider about having an MRI and a mammogram every year.  Breast cancer (BRCA) gene test is recommended for women who have family members with BRCA-related cancers. Results of the assessment will determine the need for genetic counseling and BRCA1 and for BRCA2 testing. BRCA-related cancers include these types:  Breast. This occurs in males or females.  Ovarian.  Tubal. This may also be called fallopian tube cancer.  Cancer of the abdominal or pelvic lining (peritoneal cancer).  Prostate.  Pancreatic. Cervical, Uterine, and Ovarian Cancer Your health care provider may recommend that you be screened regularly for cancer of the pelvic organs. These include your ovaries, uterus, and vagina. This screening involves a pelvic exam, which includes checking for microscopic changes to the surface of your cervix (Pap test).  For women ages 21-65, health care providers may recommend a pelvic exam and a Pap test every three years. For women ages 57-65, they may recommend the Pap test and pelvic exam, combined with testing for human papilloma virus (HPV), every five years. Some types of HPV increase your risk of cervical cancer. Testing for HPV may also be done on women of any age  who have unclear Pap test results.  Other health care providers may not recommend any screening for nonpregnant women who are considered low risk for pelvic cancer and have no symptoms. Ask your health care provider if a screening pelvic exam is right for you.  If you have had past treatment for cervical cancer or a condition that could lead to cancer, you need Pap tests and screening for cancer for at least 20 years after your treatment. If Pap tests have been discontinued for you, your risk factors (such as having a new sexual partner) need to be reassessed to determine if you should start having screenings again. Some women have medical problems that increase the chance of getting cervical cancer. In these cases, your health care provider may recommend that you have screening and Pap tests more often.  If you have a family history of uterine cancer or ovarian cancer, talk with your health care provider about genetic screening.  If you have vaginal bleeding after reaching menopause, tell your health care provider.  There are currently no reliable tests available to screen for ovarian cancer. Lung Cancer Lung cancer screening is recommended for adults 67-64 years old who are at high risk  for lung cancer because of a history of smoking. A yearly low-dose CT scan of the lungs is recommended if you:  Currently smoke.  Have a history of at least 30 pack-years of smoking and you currently smoke or have quit within the past 15 years. A pack-year is smoking an average of one pack of cigarettes per day for one year. Yearly screening should:  Continue until it has been 15 years since you quit.  Stop if you develop a health problem that would prevent you from having lung cancer treatment. Colorectal Cancer  This type of cancer can be detected and can often be prevented.  Routine colorectal cancer screening usually begins at age 71 and continues through age 106.  If you have risk factors for colon  cancer, your health care provider may recommend that you be screened at an earlier age.  If you have a family history of colorectal cancer, talk with your health care provider about genetic screening.  Your health care provider may also recommend using home test kits to check for hidden blood in your stool.  A small camera at the end of a tube can be used to examine your colon directly (sigmoidoscopy or colonoscopy). This is done to check for the earliest forms of colorectal cancer.  Direct examination of the colon should be repeated every 5-10 years until age 70. However, if early forms of precancerous polyps or small growths are found or if you have a family history or genetic risk for colorectal cancer, you may need to be screened more often. Skin Cancer  Check your skin from head to toe regularly.  Monitor any moles. Be sure to tell your health care provider:  About any new moles or changes in moles, especially if there is a change in a mole's shape or color.  If you have a mole that is larger than the size of a pencil eraser.  If any of your family members has a history of skin cancer, especially at a young age, talk with your health care provider about genetic screening.  Always use sunscreen. Apply sunscreen liberally and repeatedly throughout the day.  Whenever you are outside, protect yourself by wearing long sleeves, pants, a wide-brimmed hat, and sunglasses. WHAT SHOULD I KNOW ABOUT OSTEOPOROSIS? Osteoporosis is a condition in which bone destruction happens more quickly than new bone creation. After menopause, you may be at an increased risk for osteoporosis. To help prevent osteoporosis or the bone fractures that can happen because of osteoporosis, the following is recommended:  If you are 54-63 years old, get at least 1,000 mg of calcium and at least 600 mg of vitamin D per day.  If you are older than age 44 but younger than age 89, get at least 1,200 mg of calcium and at  least 600 mg of vitamin D per day.  If you are older than age 53, get at least 1,200 mg of calcium and at least 800 mg of vitamin D per day. Smoking and excessive alcohol intake increase the risk of osteoporosis. Eat foods that are rich in calcium and vitamin D, and do weight-bearing exercises several times each week as directed by your health care provider. WHAT SHOULD I KNOW ABOUT HOW MENOPAUSE AFFECTS [place]? Depression may occur at any age, but it is more common as you become older. Common symptoms of depression include:  Low or sad mood.  Changes in sleep patterns.  Changes in appetite or eating patterns.  Feeling an  overall lack of motivation or enjoyment of activities that you previously enjoyed.  Frequent crying spells. Talk with your health care provider if you think that you are experiencing depression. WHAT SHOULD I KNOW ABOUT IMMUNIZATIONS? It is important that you get and maintain your immunizations. These include:  Tetanus, diphtheria, and pertussis (Tdap) booster vaccine.  Influenza every year before the flu season begins.  Pneumonia vaccine.  Shingles vaccine. Your health care provider may also recommend other immunizations.   This information is not intended to replace advice given to you by your health care provider. Make sure you discuss any questions you have with your health care provider.   Document Released: 06/21/2005 Document Revised: 05/20/2014 Document Reviewed: 12/30/2013 Elsevier Interactive Patient Education [employer].

## 2015-08-30 NOTE — Progress Notes (Signed)
Pre-visit discussion using our clinic review tool. No additional management support is needed unless otherwise documented below in the visit note.  

## 2015-09-01 NOTE — Assessment & Plan Note (Signed)

## 2015-09-04 DIAGNOSIS — M0609 Rheumatoid arthritis without rheumatoid factor, multiple sites: Secondary | ICD-10-CM | POA: Diagnosis not present

## 2015-09-04 DIAGNOSIS — Z79899 Other long term (current) drug therapy: Secondary | ICD-10-CM | POA: Diagnosis not present

## 2015-09-07 ENCOUNTER — Ambulatory Visit: Payer: Medicare (Managed Care)

## 2015-09-08 DIAGNOSIS — S32000D Wedge compression fracture of unspecified lumbar vertebra, subsequent encounter for fracture with routine healing: Secondary | ICD-10-CM | POA: Diagnosis not present

## 2015-09-08 DIAGNOSIS — M6281 Muscle weakness (generalized): Secondary | ICD-10-CM | POA: Diagnosis not present

## 2015-09-11 DIAGNOSIS — M6281 Muscle weakness (generalized): Secondary | ICD-10-CM | POA: Diagnosis not present

## 2015-09-11 DIAGNOSIS — Z79899 Other long term (current) drug therapy: Secondary | ICD-10-CM | POA: Diagnosis not present

## 2015-09-11 DIAGNOSIS — M79671 Pain in right foot: Secondary | ICD-10-CM | POA: Diagnosis not present

## 2015-09-11 DIAGNOSIS — S32000D Wedge compression fracture of unspecified lumbar vertebra, subsequent encounter for fracture with routine healing: Secondary | ICD-10-CM | POA: Diagnosis not present

## 2015-09-11 DIAGNOSIS — M0609 Rheumatoid arthritis without rheumatoid factor, multiple sites: Secondary | ICD-10-CM | POA: Diagnosis not present

## 2015-09-11 DIAGNOSIS — M8000XS Age-related osteoporosis with current pathological fracture, unspecified site, sequela: Secondary | ICD-10-CM | POA: Diagnosis not present

## 2015-09-13 DIAGNOSIS — M6281 Muscle weakness (generalized): Secondary | ICD-10-CM | POA: Diagnosis not present

## 2015-09-13 DIAGNOSIS — S32000D Wedge compression fracture of unspecified lumbar vertebra, subsequent encounter for fracture with routine healing: Secondary | ICD-10-CM | POA: Diagnosis not present

## 2015-09-15 DIAGNOSIS — S32000D Wedge compression fracture of unspecified lumbar vertebra, subsequent encounter for fracture with routine healing: Secondary | ICD-10-CM | POA: Diagnosis not present

## 2015-09-15 DIAGNOSIS — M6281 Muscle weakness (generalized): Secondary | ICD-10-CM | POA: Diagnosis not present

## 2015-09-21 DIAGNOSIS — M6281 Muscle weakness (generalized): Secondary | ICD-10-CM | POA: Diagnosis not present

## 2015-09-21 DIAGNOSIS — S32000D Wedge compression fracture of unspecified lumbar vertebra, subsequent encounter for fracture with routine healing: Secondary | ICD-10-CM | POA: Diagnosis not present

## 2015-09-25 DIAGNOSIS — M6281 Muscle weakness (generalized): Secondary | ICD-10-CM | POA: Diagnosis not present

## 2015-09-25 DIAGNOSIS — S32000D Wedge compression fracture of unspecified lumbar vertebra, subsequent encounter for fracture with routine healing: Secondary | ICD-10-CM | POA: Diagnosis not present

## 2015-10-11 ENCOUNTER — Ambulatory Visit: Payer: Medicare (Managed Care)

## 2015-10-13 ENCOUNTER — Encounter: Payer: Self-pay | Admitting: Internal Medicine

## 2015-10-13 ENCOUNTER — Ambulatory Visit (INDEPENDENT_AMBULATORY_CARE_PROVIDER_SITE_OTHER): Payer: Medicare Other | Admitting: Internal Medicine

## 2015-10-13 ENCOUNTER — Emergency Department
Admission: EM | Admit: 2015-10-13 | Discharge: 2015-10-13 | Disposition: A | Discharge status: Home / Self Care | Payer: Medicare Other | Attending: Emergency Medicine | Admitting: Emergency Medicine

## 2015-10-13 ENCOUNTER — Emergency Department: Payer: Medicare Other

## 2015-10-13 VITALS — BP 118/68 | HR 85 | Temp 98.4°F | Resp 18

## 2015-10-13 DIAGNOSIS — Z7952 Long term (current) use of systemic steroids: Secondary | ICD-10-CM | POA: Diagnosis not present

## 2015-10-13 DIAGNOSIS — R079 Chest pain, unspecified: Secondary | ICD-10-CM | POA: Diagnosis not present

## 2015-10-13 DIAGNOSIS — M25512 Pain in left shoulder: Secondary | ICD-10-CM

## 2015-10-13 DIAGNOSIS — M810 Age-related osteoporosis without current pathological fracture: Secondary | ICD-10-CM | POA: Diagnosis not present

## 2015-10-13 DIAGNOSIS — Z7982 Long term (current) use of aspirin: Secondary | ICD-10-CM | POA: Diagnosis not present

## 2015-10-13 DIAGNOSIS — R0789 Other chest pain: Secondary | ICD-10-CM | POA: Insufficient documentation

## 2015-10-13 DIAGNOSIS — R110 Nausea: Secondary | ICD-10-CM | POA: Diagnosis not present

## 2015-10-13 DIAGNOSIS — Z79899 Other long term (current) drug therapy: Secondary | ICD-10-CM | POA: Diagnosis not present

## 2015-10-13 DIAGNOSIS — M069 Rheumatoid arthritis, unspecified: Secondary | ICD-10-CM | POA: Insufficient documentation

## 2015-10-13 LAB — COMPREHENSIVE METABOLIC PANEL
ALT: 17 U/L (ref 14–54)
AST: 27 U/L (ref 15–41)
Albumin: 4.5 g/dL (ref 3.5–5.0)
Alkaline Phosphatase: 53 U/L (ref 38–126)
Anion gap: 10 (ref 5–15)
BUN: 17 mg/dL (ref 6–20)
CO2: 27 mmol/L (ref 22–32)
Calcium: 9.8 mg/dL (ref 8.9–10.3)
Chloride: 102 mmol/L (ref 101–111)
Creatinine, Ser: 0.51 mg/dL (ref 0.44–1.00)
GFR calc Af Amer: >60 mL/min (ref >60)
GFR calc non Af Amer: >60 mL/min (ref >60)
Glucose, Bld: 97 mg/dL (ref 65–99)
Potassium: 3.9 mmol/L (ref 3.5–5.1)
Sodium: 139 mmol/L (ref 135–145)
Total Bilirubin: 0.7 mg/dL (ref 0.3–1.2)
Total Protein: 7.4 g/dL (ref 6.5–8.1)

## 2015-10-13 LAB — TROPONIN I
Troponin I: <0.03 ng/mL (ref <0.031)
Troponin I: <0.03 ng/mL (ref <0.031)

## 2015-10-13 LAB — CBC
HCT: 41.2 % (ref 35.0–47.0)
Hemoglobin: 13.9 g/dL (ref 12.0–16.0)
MCH: 32.5 pg (ref 26.0–34.0)
MCHC: 33.6 g/dL (ref 32.0–36.0)
MCV: 96.5 fL (ref 80.0–100.0)
Platelets: 262 10*3/uL (ref 150–440)
RBC: 4.27 MIL/uL (ref 3.80–5.20)
RDW: 13.9 % (ref 11.5–14.5)
WBC: 7.8 10*3/uL (ref 3.6–11.0)

## 2015-10-13 LAB — BRAIN NATRIURETIC PEPTIDE: B Natriuretic Peptide: 74 pg/mL (ref 0.0–100.0)

## 2015-10-13 MED ORDER — ASPIRIN 81 MG PO CHEW
324.0000 mg | CHEWABLE_TABLET | Freq: Once | ORAL | Status: AC
  Administered 2015-10-13: 324 mg via ORAL
  Filled 2015-10-13: qty 4

## 2015-10-13 NOTE — Discharge Instructions (Signed)
You have been seen in the Emergency Department (ED) today for chest pain.  As we have discussed todays test results are normal, but you may require further testing.  Please follow up with the recommended doctor as instructed above in these documents regarding todays emergent visit and your recent symptoms to discuss further management.  Continue to take your regular medications. If you are not doing so already, please also take a daily baby aspirin (81 mg), at least until you follow up with your doctor.  Please also read through the information about shoulder pain and try the recommended conservative measures.  Follow up with your primary doctor if your shoulder pain does not improve.  Return to the Emergency Department (ED) if you experience any further chest pain/pressure/tightness, difficulty breathing, or sudden sweating, or other symptoms that concern you.   Chest Pain (Nonspecific) It is often hard to give a specific diagnosis for the cause of chest pain. There is always a chance that your pain could be related to something serious, such as a heart attack or a blood clot in the lungs. You need to follow up with your health care provider for further evaluation. CAUSES   Heartburn.  Pneumonia or bronchitis.  Anxiety or stress.  Inflammation around your heart (pericarditis) or lung (pleuritis or pleurisy).  A blood clot in the lung.  A collapsed lung (pneumothorax). It can develop suddenly on its own (spontaneous pneumothorax) or from trauma to the chest.  Shingles infection (herpes zoster virus). The chest wall is composed of bones, muscles, and cartilage. Any of these can be the source of the pain.  The bones can be bruised by injury.  The muscles or cartilage can be strained by coughing or overwork.  The cartilage can be affected by inflammation and become sore (costochondritis). DIAGNOSIS  Lab tests or other studies may be needed to find the cause of your pain. Your health  care provider may have you take a test called an ambulatory electrocardiogram (ECG). An ECG records your heartbeat patterns over a 24-hour period. You may also have other tests, such as:  Transthoracic echocardiogram (TTE). During echocardiography, sound waves are used to evaluate how blood flows through your heart.  Transesophageal echocardiogram (TEE).  Cardiac monitoring. This allows your health care provider to monitor your heart rate and rhythm in real time.  Holter monitor. This is a portable device that records your heartbeat and can help diagnose heart arrhythmias. It allows your health care provider to track your heart activity for several days, if needed.  Stress tests by exercise or by giving medicine that makes the heart beat faster. TREATMENT   Treatment depends on what may be causing your chest pain. Treatment may include:  Acid blockers for heartburn.  Anti-inflammatory medicine.  Pain medicine for inflammatory conditions.  Antibiotics if an infection is present.  You may be advised to change lifestyle habits. This includes stopping smoking and avoiding alcohol, caffeine, and chocolate.  You may be advised to keep your head raised (elevated) when sleeping. This reduces the chance of acid going backward from your stomach into your esophagus. Most of the time, nonspecific chest pain will improve within 2-3 days with rest and mild pain medicine.  HOME CARE INSTRUCTIONS   If antibiotics were prescribed, take them as directed. Finish them even if you start to feel better.  For the next few days, avoid physical activities that bring on chest pain. Continue physical activities as directed.  Do not use any tobacco products,  including cigarettes, chewing tobacco, or electronic cigarettes.  Avoid drinking alcohol.  Only take medicine as directed by your health care provider.  Follow your health care provider's suggestions for further testing if your chest pain does not go  away.  Keep any follow-up appointments you made. If you do not go to an appointment, you could develop lasting (chronic) problems with pain. If there is any problem keeping an appointment, call to reschedule. SEEK MEDICAL CARE IF:   Your chest pain does not go away, even after treatment.  You have a rash with blisters on your chest.  You have a fever. SEEK IMMEDIATE MEDICAL CARE IF:   You have increased chest pain or pain that spreads to your arm, neck, jaw, back, or abdomen.  You have shortness of breath.  You have an increasing cough, or you cough up blood.  You have severe back or abdominal pain.  You feel nauseous or vomit.  You have severe weakness.  You faint.  You have chills. This is an emergency. Do not wait to see if the pain will go away. Get medical help at once. Call your local emergency services (911 in [place] Do not drive yourself to the hospital. MAKE SURE YOU:   Understand these instructions.  Will watch your condition.  Will get help right away if you are not doing well or get worse. Document Released: 02/06/2005 Document Revised: 05/04/2013 Document Reviewed: 12/03/2007 [facility] Patient Information 2015 [place], [place]. This information is not intended to replace advice given to you by your health care provider. Make sure you discuss any questions you have with your health care provider.    Shoulder Pain The shoulder is the joint that connects your arms to your body. The bones that form the shoulder joint include the upper arm bone (humerus), the shoulder blade (scapula), and the collarbone (clavicle). The top of the humerus is shaped like a ball and fits into a rather flat socket on the scapula (glenoid cavity). A combination of muscles and strong, fibrous tissues that connect muscles to bones (tendons) support your shoulder joint and hold the ball in the socket. Small, fluid-filled sacs (bursae) are located in different areas of the joint. They act as  cushions between the bones and the overlying soft tissues and help reduce friction between the gliding tendons and the bone as you move your arm. Your shoulder joint allows a wide range of motion in your arm. This range of motion allows you to do things like scratch your back or throw a ball. However, this range of motion also makes your shoulder more prone to pain from overuse and injury. Causes of shoulder pain can originate from both injury and overuse and usually can be grouped in the following four categories:  Redness, swelling, and pain (inflammation) of the tendon (tendinitis) or the bursae (bursitis).  Instability, such as a dislocation of the joint.  Inflammation of the joint (arthritis).  Broken bone (fracture). HOME CARE INSTRUCTIONS   Apply ice to the sore area.  Put ice in a plastic bag.  Place a towel between your skin and the bag.  Leave the ice on for 15-20 minutes, 3-4 times per day for the first 2 days, or as directed by your health care provider.  Stop using cold packs if they do not help with the pain.  If you have a shoulder sling or immobilizer, wear it as long as your caregiver instructs. Only remove it to shower or bathe. Move your arm as  little as possible, but keep your hand moving to prevent swelling.  Squeeze a soft ball or foam pad as much as possible to help prevent swelling.  Only take over-the-counter or prescription medicines for pain, discomfort, or fever as directed by your caregiver. SEEK MEDICAL CARE IF:   Your shoulder pain increases, or new pain develops in your arm, hand, or fingers.  Your hand or fingers become cold and numb.  Your pain is not relieved with medicines. SEEK IMMEDIATE MEDICAL CARE IF:   Your arm, hand, or fingers are numb or tingling.  Your arm, hand, or fingers are significantly swollen or turn white or blue. MAKE SURE YOU:   Understand these instructions.  Will watch your condition.  Will get help right away if  you are not doing well or get worse.   This information is not intended to replace advice given to you by your health care provider. Make sure you discuss any questions you have with your health care provider.   Document Released: 02/06/2005 Document Revised: 05/20/2014 Document Reviewed: 08/22/2014 Elsevier Interactive Patient Education 2016 [place] therapy can help ease sore, stiff, injured, and tight muscles and joints. Heat relaxes your muscles, which may help ease your pain.  RISKS AND COMPLICATIONS If you have any of the following conditions, do not use heat therapy unless your health care provider has approved:  Poor circulation.  Healing wounds or scarred skin in the area being treated.  Diabetes, heart disease, or high blood pressure.  Not being able to feel (numbness) the area being treated.  Unusual swelling of the area being treated.  Active infections.  Blood clots.  Cancer.  Inability to communicate pain. This may include young children and people who have problems with their brain function (dementia).  Pregnancy. Heat therapy should only be used on old, pre-existing, or long-lasting (chronic) injuries. Do not use heat therapy on new injuries unless directed by your health care provider. HOW TO USE HEAT THERAPY There are several different kinds of heat therapy, including:  Moist heat pack.  Warm water bath.  Hot water bottle.  Electric heating pad.  Heated gel pack.  Heated wrap.  Electric heating pad. Use the heat therapy method suggested by your health care provider. Follow your health care provider's instructions on when and how to use heat therapy. GENERAL HEAT THERAPY RECOMMENDATIONS  Do not sleep while using heat therapy. Only use heat therapy while you are awake.  Your skin may turn pink while using heat therapy. Do not use heat therapy if your skin turns red.  Do not use heat therapy if you have new pain.  High  heat or long exposure to heat can cause burns. Be careful when using heat therapy to avoid burning your skin.  Do not use heat therapy on areas of your skin that are already irritated, such as with a rash or sunburn. SEEK MEDICAL CARE IF:  You have blisters, redness, swelling, or numbness.  You have new pain.  Your pain is worse. MAKE SURE YOU:  Understand these instructions.  Will watch your condition.  Will get help right away if you are not doing well or get worse.   This information is not intended to replace advice given to you by your health care provider. Make sure you discuss any questions you have with your health care provider.   Document Released: 07/22/2011 Document Revised: 05/20/2014 Document Reviewed: 06/22/2013 Elsevier Interactive Patient Education [employer].

## 2015-10-13 NOTE — Assessment & Plan Note (Signed)
This started last pm as well.  This appears to be more msk in origin.  Is aggravated by lifting her arm and palpation.  Have to assume this is a separate issue from the chest pressure and sob and pursue w/up for the chest pressure and sob in ER as outlined.  Pt comfortable with this plan.

## 2015-10-13 NOTE — ED Notes (Signed)
Pt states that she started having pain in her left arm last night, cont to have it, worse when she attempts to raise the arm. Pt also states that she started with heaviness in her chest since last night as well, pt went to see her family dr who did an ekg and sent her to the ER with a new right bundle branch block on her EKG, pt drove herself, no distress noted, pt ambulatory without difficulty

## 2015-10-13 NOTE — Assessment & Plan Note (Signed)
Pt with chest pressure last night.  Labored breathing this am.  Some increased belching last night.  EKG with SR with what appears to be a RBBB.  Do not see this present on previous EKG.  Given symptoms and EKG changes, I do feel further w/up and evaluation warranted.  Pt refused EMS transportation.  Refused for me to take her to ER.  Only would agree to go if she drove herself.  ER notified.  Pain free on leaving clinic.

## 2015-10-13 NOTE — Progress Notes (Signed)
Pre visit review using our clinic review tool, if applicable. No additional management support is needed unless otherwise documented below in the visit note. ECG ordered and completed.

## 2015-10-13 NOTE — ED Provider Notes (Signed)
[facility] Emergency Department Provider Note  ____________________________________________  Time seen: Approximately 5:35 PM  I have reviewed the triage vital signs and the nursing notes.   HISTORY  Chief Complaint Chest Pain    HPI Teresa Lin is a 82 y.o. female who is quite healthy for her age presents from her primary care doctor is office for evaluation of a changed EKG in the setting of an episode of chest pain last night.  The patient reports that last night she had some mild central and left-sided heaviness in her chest that she describes as discomfort and was present for about 20 minutes.  It occurred at rest.  It was accompanied with very slight shortness of breath but she thinks it was more just because of the sensation of pressure.  She denies diaphoresis, nausea, vomiting, diarrhea, dysuria, abdominal pain.  She also has been having pain in her left shoulder for a couple of days.  This occurred not long after going on a trip and caring some suitcases.  She does not remember any trauma to the shoulder but it is easily reproducible by raising the arm or lifting anything heavy with that and is located right at the top of the shoulder.  She describes this pain as sharp and also mild and made worse with lifting things and moving the arm.  She reports that she has seen Dr. Darrold Junker in the past and has had a stress test within about the last year.  She does not have hypertension, diabetes, hypercholesterolemia.  She has never smoked.  She does have multiple first-degree relatives who have had heart disease and heart attacks.She takes an 81 mg aspirin regularly but not every day.   Past Medical History  Diagnosis Date  . Arthritis   . Phlebitis and thrombophlebitis of deep veins of lower extremities (HCC)     after surgery  . Colon polyps   . Collagen vascular disease [place] [facility])     Patient Active Problem List   Diagnosis Date Noted  . Left shoulder pain  10/13/2015  . Venous stasis ulcer of right lower extremity (HCC) 08/08/2015  . Compression fracture of L2 (HCC) 06/05/2015  . Constipation 05/30/2015  . Osteoporosis, postmenopausal 05/30/2015  . Compression fracture of L2 lumbar vertebra with delayed healing 05/29/2015  . Frequent falls 03/26/2015  . Atherosclerosis of arteries 03/26/2015  . Chest pain 02/09/2015  . Medicare annual wellness visit, subsequent 10/01/2014  . Family history of colon cancer 09/28/2014  . Tenesmus (rectal) 06/12/2014  . TMJ (temporomandibular joint syndrome) 06/12/2014  . Varicose veins of both lower extremities without ulcer or inflammation 06/12/2014  . B12 deficiency 09/20/2013  . Internal hemorrhoids without complication 04/20/2013  . Insomnia 03/03/2013  . Vertigo due to cerebrovascular disease 03/03/2013  . Rheumatoid arthritis (HCC) 03/03/2013    Past Surgical History  Procedure Laterality Date  . Appendectomy    . Joint replacement Bilateral     knee replacement    Current Outpatient Rx  Name  Route  Sig  Dispense  Refill  . alendronate (FOSAMAX) 70 MG tablet   Oral   Take by mouth.         Marland Kitchen aspirin 81 MG tablet   Oral   Take 81 mg by mouth daily.         . calcium-vitamin D (OSCAL WITH D) 500-200 MG-UNIT per tablet   Oral   Take 1 tablet by mouth.         Marland Kitchen  folic acid (FOLVITE) 1 MG tablet   Oral   Take 1 mg by mouth daily.          . hydroxychloroquine (PLAQUENIL) 200 MG tablet   Oral   Take 200 mg by mouth daily.         . methotrexate 50 MG/2ML injection   Subcutaneous   Inject into the skin.         . Multiple Vitamins-Minerals (MULTIVITAMIN WITH MINERALS) tablet   Oral   Take 1 tablet by mouth daily.         . polyethylene glycol (GOLYTELY) 236 g solution      Drink 8 ounces every 30 minutes until your constipation is relieved Patient not taking: Reported on 08/30/2015   4000 mL   0   . predniSONE (DELTASONE) 5 MG tablet   Oral   Take 5 mg by  mouth daily. Reported on 10/13/2015           Allergies Infliximab and Synvisc  Family History  Problem Relation Age of Onset  . Heart disease Mother   . Hypertension Mother   . Cancer Mother     colon ca  . Heart disease Father   . Hypertension Father   . Hypertension Sister   . Multiple sclerosis Daughter   . Multiple sclerosis Son   . Multiple sclerosis Son     Social History Social History  Substance Use Topics  . Smoking status: Never Smoker   . Smokeless tobacco: Never Used  . Alcohol Use: 1.2 oz/week    2 Cans of beer per week     Comment: occasionally     Review of Systems Constitutional: No fever/chills Eyes: No visual changes. ENT: No sore throat. Cardiovascular: 1 episode chest pressure last night Respiratory: mild shortness of breath associated with the chest pain Gastrointestinal: No abdominal pain.  No nausea, no vomiting.  No diarrhea.  No constipation. Genitourinary: Negative for dysuria. Musculoskeletal: Negative for back pain.  Pain in right lateral shoulder, reproducible with movement Skin: Negative for rash. Neurological: Negative for headaches, focal weakness or numbness.  10-point ROS otherwise negative.  ____________________________________________   PHYSICAL EXAM:  VITAL SIGNS: ED Triage Vitals  Enc Vitals Group     BP 10/13/15 1325 144/77 mmHg     Pulse Rate 10/13/15 1325 89     Resp 10/13/15 1325 16     Temp 10/13/15 1325 98.1 F (36.7 C)     Temp Source 10/13/15 1325 Oral     SpO2 10/13/15 1325 95 %     Weight 10/13/15 1325 138 lb (62.596 kg)     Height 10/13/15 1325 5\' 6"  (1.676 m)     Head Cir --      Peak Flow --      Pain Score 10/13/15 1326 4     Pain Loc --      Pain Edu? --      Excl. in [place]? --     Constitutional: Alert and oriented. Well appearing and in no acute distress.  Appears younger than chronological age. Eyes: Conjunctivae are normal. PERRL. EOMI. Head: Atraumatic. Nose: No  congestion/rhinnorhea. Mouth/Throat: Mucous membranes are moist.  Oropharynx non-erythematous. Neck: No stridor.  No meningeal signs.   Cardiovascular: Normal rate, regular rhythm. Good peripheral circulation. Grossly normal heart sounds.   Respiratory: Normal respiratory effort.  No retractions. Lungs CTAB. Gastrointestinal: Soft and nontender. No distention.  Musculoskeletal: Reproducible tenderness to palpation of the [facility] joint and deltoid of  the left shoulder, as well as with abduction of the arm.  No lower extremity tenderness nor edema. No gross deformities of extremities. Neurologic:  Normal speech and language. No gross focal neurologic deficits are appreciated.  Skin:  Skin is warm, dry and intact. No rash noted. Psychiatric: Mood and affect are normal. Speech and behavior are normal.  ____________________________________________   LABS (all labs ordered are listed, but only abnormal results are displayed)  Labs Reviewed  CBC  COMPREHENSIVE METABOLIC PANEL  BRAIN NATRIURETIC PEPTIDE  TROPONIN I  TROPONIN I   ____________________________________________  EKG  ED ECG REPORT I, FORBACH, CORY, the attending physician, personally viewed and interpreted this ECG.  Date: 10/13/2015 EKG Time: 13:19 Rate: 83 Rhythm: normal sinus rhythm QRS Axis: normal Intervals: Right bundle branch block ST/T Wave abnormalities: normal Conduction Disturbances: none Narrative Interpretation: unremarkable  ____________________________________________  RADIOLOGY   Dg Chest 2 View  10/13/2015  CLINICAL DATA:  Left arm pain and nausea EXAM: CHEST  2 VIEW COMPARISON:  01/10/2014 FINDINGS: The heart size and mediastinal contours are within normal limits. Both lungs are clear. The visualized skeletal structures are unremarkable. IMPRESSION: No active cardiopulmonary disease. Electronically Signed   By: Skipper Cliche M.D.   On: 10/13/2015 13:51     ____________________________________________   PROCEDURES  Procedure(s) performed: None  Critical Care performed: No ____________________________________________   INITIAL IMPRESSION / ASSESSMENT AND PLAN / ED COURSE  Pertinent labs & imaging results that were available during my care of the patient were reviewed by me and considered in my medical decision making (see chart for details).  Wells Score for PE is zero.  HEART score is 3 based on age and family history.  2 troponins have been negative.  She does have new right bundle branch block, but it is new at some point over the last 3 or 4 years; it does not necessarily reflect an acute change.  Patient does NOT want to stay in the hospital, has established care with Dr. Saralyn Pilar, and has had a negative stress test within the last year.  I think it is appropriate for discharge and outpatient follow-up with cardiology.  I will send a message via [facility] to Dr. Saralyn Pilar to let him know that she would benefit from close outpatient follow-up.  I gave her a full dose aspirin in the emergency department and she will continue to take her regular 81 mg aspirin.  Guarding her left shoulder pain, it appears very musculoskeletal.  I offered to obtain x-rays which she declines at this time.  I recommended conservative treatment measures.  She agrees with the plan and knows to return if her symptoms worsen.   ____________________________________________  FINAL CLINICAL IMPRESSION(S) / ED DIAGNOSES  Final diagnoses:  Chest pain, unspecified chest pain type  Left shoulder pain     MEDICATIONS GIVEN DURING THIS VISIT:  Medications  aspirin chewable tablet 324 mg (324 mg Oral Given 10/13/15 1802)     NEW OUTPATIENT MEDICATIONS STARTED DURING THIS VISIT:  New Prescriptions   No medications on file      Note:  This document was prepared using Dragon voice recognition software and may include unintentional dictation errors.   Hinda Kehr, MD 10/13/15 [phone]

## 2015-10-13 NOTE — Progress Notes (Signed)
Patient ID: Teresa Lin, female   DOB: 25-Sep-1931, 82 y.o.   MRN: [id number]   Subjective:    Patient ID: Teresa Lin, female    DOB: 1932-05-03, 82 y.o.   MRN: [id number]  HPI  Patient here as a work in with concerns regarding left arm pain and heaviness in chest.  She reports increased left shoulder pain that is worse with attempts at lifting.  She also reports increased chest pressure last night.  Is better this am.  Some labored breathing this am - with walking.  Some increased belching last night.  No acid reflux.  No abdominal pain or cramping.  No chest pressure currently.    Past Medical History  Diagnosis Date  . Arthritis   . Phlebitis and thrombophlebitis of deep veins of lower extremities (HCC)     after surgery  . Colon polyps   . Collagen vascular disease [facility])    Past Surgical History  Procedure Laterality Date  . Appendectomy    . Joint replacement Bilateral     knee replacement   Family History  Problem Relation Age of Onset  . Heart disease Mother   . Hypertension Mother   . Cancer Mother     colon ca  . Heart disease Father   . Hypertension Father   . Hypertension Sister   . Multiple sclerosis Daughter   . Multiple sclerosis Son   . Multiple sclerosis Son    Social History   Social History  . Marital Status: Widowed    Spouse Name: N/A  . Number of Children: N/A  . Years of Education: N/A   Social History Main Topics  . Smoking status: Never Smoker   . Smokeless tobacco: Never Used  . Alcohol Use: 1.2 oz/week    2 Cans of beer per week     Comment: occasionally   . Drug Use: No  . Sexual Activity: No   Other Topics Concern  . None   Social History Narrative    Outpatient Encounter Prescriptions as of 10/13/2015  Medication Sig  . alendronate (FOSAMAX) 70 MG tablet Take by mouth.  Marland Kitchen aspirin 81 MG tablet Take 81 mg by mouth daily.  . calcium-vitamin D (OSCAL WITH D) 500-200 MG-UNIT per tablet Take 1 tablet by mouth.  . folic acid (FOLVITE)  1 MG tablet Take 1 mg by mouth daily.   . hydroxychloroquine (PLAQUENIL) 200 MG tablet Take 200 mg by mouth daily.  . methotrexate 50 MG/2ML injection Inject into the skin.  . Multiple Vitamins-Minerals (MULTIVITAMIN WITH MINERALS) tablet Take 1 tablet by mouth daily.  . polyethylene glycol (GOLYTELY) 236 g solution Drink 8 ounces every 30 minutes until your constipation is relieved (Patient not taking: Reported on 08/30/2015)  . predniSONE (DELTASONE) 5 MG tablet Take 5 mg by mouth daily. Reported on 10/13/2015   No facility-administered encounter medications on file as of 10/13/2015.    Review of Systems  Constitutional: Negative for appetite change and unexpected weight change.  HENT: Negative for congestion and sinus pressure.   Respiratory: Positive for chest tightness (described as chest pressure. ) and shortness of breath (described labored breathing this am.  noticed with walking.  ). Negative for cough.   Cardiovascular: Positive for chest pain. Negative for palpitations and leg swelling.  Gastrointestinal: Negative for nausea, vomiting, abdominal pain and diarrhea.       Increased burping.   Genitourinary: Negative for dysuria and difficulty urinating.  Musculoskeletal: Negative for back pain.  Has arthritis.  Increased left shoulder pain.  Limited rom.  Increased pain with lifting her arm.   Skin: Negative for rash.  Neurological: Negative for syncope and headaches.  Psychiatric/Behavioral: Negative for dysphoric mood and agitation.       Objective:    Physical Exam  Constitutional: She appears well-developed and well-nourished. No distress.  HENT:  Nose: Nose normal.  Mouth/Throat: Oropharynx is clear and moist.  Neck: Neck supple. No thyromegaly present.  Cardiovascular: Normal rate and regular rhythm.   Pulmonary/Chest: Breath sounds normal. No respiratory distress. She has no wheezes.  Abdominal: Soft. Bowel sounds are normal. There is no tenderness.    Musculoskeletal: She exhibits no edema or tenderness.  Lymphadenopathy:    She has no cervical adenopathy.  Skin: No rash noted. No erythema.  Psychiatric: She has a normal mood and affect. Her behavior is normal.    BP 118/68 mmHg  Pulse 85  Temp(Src) 98.4 F (36.9 C) (Oral)  Resp 18  SpO2 98% Wt Readings from Last 3 Encounters:  08/30/15 144 lb 12 oz (65.658 kg)  08/07/15 144 lb 4 oz (65.431 kg)  06/05/15 143 lb (64.864 kg)     Lab Results  Component Value Date   WBC 6.2 08/28/2015   HGB 13.4 08/28/2015   HCT 40.5 08/28/2015   PLT 306.0 08/28/2015   GLUCOSE 83 08/28/2015   CHOL 181 08/28/2015   TRIG 83.0 08/28/2015   HDL 62.90 08/28/2015   LDLCALC 102* 08/28/2015   ALT 16 08/28/2015   AST 26 08/28/2015   NA 143 08/28/2015   K 4.2 08/28/2015   CL 105 08/28/2015   CREATININE 0.58 08/28/2015   BUN 12 08/28/2015   CO2 29 08/28/2015   TSH 2.04 08/28/2015   INR 0.9 08/08/2011    Mr Lumbar Spine W Wo Contrast  06/05/2015  CLINICAL DATA:  L2 compression fracture, initial encounter. Rheumatoid arthritis. Right foot cramping. Right low back pain for 2 weeks after a fall. EXAM: MRI LUMBAR SPINE WITHOUT AND WITH CONTRAST TECHNIQUE: Multiplanar and multiecho pulse sequences of the lumbar spine were obtained without and with intravenous contrast. CONTRAST:  13 cc MultiHance Creatinine was obtained on site at [facility] at [address]Results: Creatinine 0.6 mg/dL. COMPARISON:  02/10/2015 FINDINGS: The lowest lumbar type non-rib-bearing vertebra is labeled as L5. The conus medullaris appears normal. Conus level: L1. Small cyst in the left kidney lower pole. Mild dextroconvex lumbar rotary scoliosis. 25% superior endplate compression fracture of L2 with a subcortical transverse fracture plane extending to the posterior superior vertebral margin as well as surrounding edema. 2 mm posterior bony retropulsion. No other significant abnormal lumbar spine enhancement or  edema noted. Disc desiccation is present throughout the lumbar spine except with relative sparing of L1- 2. Additional findings at individual levels are as follows: L1-2: No impingement. Mild disc bulge and mild posterior bony retropulsion from the L2 superior endplate compression. L2-3:  No impingement.  Diffuse disc bulge.  Mild facet arthropathy. L3-4: Borderline left foraminal stenosis and borderline left subarticular lateral recess stenosis due to disc bulge and left greater than right facet arthropathy. L4-5: Mild right and borderline left foraminal stenosis and borderline right subarticular lateral recess stenosis due to disc bulge, intervertebral spurring, and facet arthropathy. L5-S1: Mild left and borderline right subarticular lateral recess stenosis with mild bilateral foraminal stenosis due to facet spurring, intervertebral spurring, and disc bulge. IMPRESSION: 1. 25% benign appearing superior endplate compression fracture of L2, likely subacute. There is  2 mm of associated bony retropulsion, not causing impingement. 2. Lumbar spondylosis and degenerative disc disease cause mild impingement at L4-5 and L5-S1 as detailed above. 3. Dextroconvex lumbar rotary scoliosis. Electronically Signed   By: Van Clines M.D.   On: 06/05/2015 15:19       Assessment & Plan:   Problem List Items Addressed This Visit    Chest pain - Primary    Pt with chest pressure last night.  Labored breathing this am.  Some increased belching last night.  EKG with SR with what appears to be a RBBB.  Do not see this present on previous EKG.  Given symptoms and EKG changes, I do feel further w/up and evaluation warranted.  Pt refused EMS transportation.  Refused for me to take her to ER.  Only would agree to go if she drove herself.  ER notified.  Pain free on leaving clinic.        Relevant Orders   EKG 12-Lead (Completed)   EKG 12-Lead   Left shoulder pain    This started last pm as well.  This appears to be more  msk in origin.  Is aggravated by lifting her arm and palpation.  Have to assume this is a separate issue from the chest pressure and sob and pursue w/up for the chest pressure and sob in ER as outlined.  Pt comfortable with this plan.            Einar Pheasant, MD

## 2015-10-19 ENCOUNTER — Ambulatory Visit
Admission: RE | Admit: 2015-10-19 | Discharge: 2015-10-19 | Disposition: A | Discharge status: Home / Self Care | Payer: Medicare Other | Source: Ambulatory Visit | Attending: Internal Medicine | Admitting: Internal Medicine

## 2015-10-19 ENCOUNTER — Other Ambulatory Visit: Payer: Self-pay | Admitting: Internal Medicine

## 2015-10-19 ENCOUNTER — Ambulatory Visit: Payer: Medicare Other

## 2015-10-19 DIAGNOSIS — Z1231 Encounter for screening mammogram for malignant neoplasm of breast: Secondary | ICD-10-CM

## 2015-10-19 DIAGNOSIS — Z1239 Encounter for other screening for malignant neoplasm of breast: Secondary | ICD-10-CM

## 2015-10-30 ENCOUNTER — Encounter: Payer: Self-pay | Admitting: Internal Medicine

## 2015-10-30 ENCOUNTER — Ambulatory Visit (INDEPENDENT_AMBULATORY_CARE_PROVIDER_SITE_OTHER): Payer: Medicare Other | Admitting: Internal Medicine

## 2015-10-30 VITALS — BP 114/62 | HR 92 | Temp 97.6°F | Resp 14 | Ht 67.0 in | Wt 142.4 lb

## 2015-10-30 DIAGNOSIS — R1030 Lower abdominal pain, unspecified: Secondary | ICD-10-CM | POA: Diagnosis not present

## 2015-10-30 DIAGNOSIS — R3129 Other microscopic hematuria: Secondary | ICD-10-CM

## 2015-10-30 DIAGNOSIS — R0789 Other chest pain: Secondary | ICD-10-CM

## 2015-10-30 DIAGNOSIS — R300 Dysuria: Secondary | ICD-10-CM | POA: Diagnosis not present

## 2015-10-30 LAB — POCT URINALYSIS DIPSTICK
Bilirubin, UA: NEGATIVE
Glucose, UA: NEGATIVE
Ketones, UA: NEGATIVE
Nitrite, UA: NEGATIVE
Protein, UA: NEGATIVE
Spec Grav, UA: 1.01
Urobilinogen, UA: 0.2
pH, UA: 6

## 2015-10-30 MED ORDER — NITROGLYCERIN 0.4 MG SL SUBL: 0.4000 mg | SUBLINGUAL_TABLET | SUBLINGUAL | Status: DC | PRN

## 2015-10-30 NOTE — Progress Notes (Signed)
Subjective:  Patient ID: Teresa Lin, female    DOB: 1932-03-12  Age: 82 y.o. MRN: [id number]  CC: The primary encounter diagnosis was Other chest pain. Diagnoses of Dysuria and Acute suprapubic pain, unspecified laterality were also pertinent to this visit.  HPI Teresa Lin presents for evaluation of suprapubic pain aggravated by drinking water and by eating.  Has been present for 2 days,  No dysuria or diarrhea.  No nausea.  Not sexually active.   UA dipstick  positive for blood and leukocytes   2)  Recent  ER evaluation for chest pain . Was seen by Einar Pheasant after having an episode of substernal chest pressure lasting 20 minutes along with left shoulder pain. EKG done in office showed a new RBBB so she was sent to ER . Spent 8 hours in ER , ruled out with negative troponins x 2, and advised to follow up with cardiology.  Wants to see [facility]  Outpatient Prescriptions Prior to Visit  Medication Sig Dispense Refill  . alendronate (FOSAMAX) 70 MG tablet Take by mouth.    Marland Kitchen aspirin 81 MG tablet Take 81 mg by mouth daily.    . calcium-vitamin D (OSCAL WITH D) 500-200 MG-UNIT per tablet Take 1 tablet by mouth.    . folic acid (FOLVITE) 1 MG tablet Take 1 mg by mouth daily.     . hydroxychloroquine (PLAQUENIL) 200 MG tablet Take 200 mg by mouth daily.    . methotrexate 50 MG/2ML injection Inject into the skin.    . Multiple Vitamins-Minerals (MULTIVITAMIN WITH MINERALS) tablet Take 1 tablet by mouth daily.    . polyethylene glycol (GOLYTELY) 236 g solution Drink 8 ounces every 30 minutes until your constipation is relieved 4000 mL 0  . predniSONE (DELTASONE) 5 MG tablet Take 5 mg by mouth daily. Reported on 10/13/2015     No facility-administered medications prior to visit.    Review of Systems;  Patient denies headache, fevers, malaise, unintentional weight loss, skin rash, eye pain, sinus congestion and sinus pain, sore throat, dysphagia,  hemoptysis , cough, dyspnea, wheezing,  chest pain, palpitations, orthopnea, edema, abdominal pain, nausea, melena, diarrhea, constipation, flank pain, dysuria, hematuria, urinary  Frequency, nocturia, numbness, tingling, seizures,  Focal weakness, Loss of consciousness,  Tremor, insomnia, depression, anxiety, and suicidal ideation.      Objective:  BP 114/62 mmHg  Pulse 92  Temp(Src) 97.6 F (36.4 C) (Oral)  Resp 14  Ht 5\' 7"  (1.702 m)  Wt 142 lb 6.4 oz (64.592 kg)  BMI 22.30 kg/m2  SpO2 97%  BP Readings from Last 3 Encounters:  10/30/15 114/62  10/13/15 133/71  10/13/15 118/68    Wt Readings from Last 3 Encounters:  10/30/15 142 lb 6.4 oz (64.592 kg)  10/13/15 138 lb (62.596 kg)  08/30/15 144 lb 12 oz (65.658 kg)    General appearance: alert, cooperative and appears stated age Ears: normal TM's and external ear canals both ears Throat: lips, mucosa, and tongue normal; teeth and gums normal Neck: no adenopathy, no carotid bruit, supple, symmetrical, trachea midline and thyroid not enlarged, symmetric, no tenderness/mass/nodules Back: symmetric, no curvature. ROM normal. No CVA tenderness. Lungs: clear to auscultation bilaterally Heart: regular rate and rhythm, S1, S2 normal, no murmur, click, rub or gallop Abdomen: soft, non-tender; bowel sounds normal; no masses,  no organomegaly Pulses: 2+ and symmetric Skin: Skin color, texture, turgor normal. No rashes or lesions Lymph nodes: Cervical, supraclavicular, and axillary nodes normal.  No results  found for: HGBA1C  Lab Results  Component Value Date   CREATININE 0.51 10/13/2015   CREATININE 0.58 08/28/2015   CREATININE 0.59 06/09/2014    Lab Results  Component Value Date   WBC 7.8 10/13/2015   HGB 13.9 10/13/2015   HCT 41.2 10/13/2015   PLT 262 10/13/2015   GLUCOSE 97 10/13/2015   CHOL 181 08/28/2015   TRIG 83.0 08/28/2015   HDL 62.90 08/28/2015   LDLCALC 102* 08/28/2015   ALT 17 10/13/2015   AST 27 10/13/2015   NA 139 10/13/2015   K 3.9  10/13/2015   CL 102 10/13/2015   CREATININE 0.51 10/13/2015   BUN 17 10/13/2015   CO2 27 10/13/2015   TSH 2.04 08/28/2015   INR 0.9 08/08/2011    Mm Screening Breast Tomo Bilateral  10/19/2015  CLINICAL DATA:  Screening. EXAM: 2D DIGITAL SCREENING BILATERAL MAMMOGRAM WITH CAD AND ADJUNCT TOMO COMPARISON:  Previous exam(s). ACR Breast Density Category b: There are scattered areas of fibroglandular density. FINDINGS: There are no findings suspicious for malignancy. Images were processed with CAD. IMPRESSION: No mammographic evidence of malignancy. A result letter of this screening mammogram will be mailed directly to the patient. RECOMMENDATION: Screening mammogram in one year. (Code:SM-B-01Y) BI-RADS CATEGORY  1: Negative. Electronically Signed   By: Everlean Alstrom M.D.   On: 10/19/2015 15:57    Assessment & Plan:   Problem List Items Addressed This Visit    Chest pain - Primary    Patient needs definitive testing for CAD . Referral to [facility].       Relevant Orders   Ambulatory referral to Cardiology   Acute suprapubic pain    Bacterial vs interstitial cystiitis.  Patient does not want to start antibiotics unless bacterial infectio nis confirmed.       Other Visit Diagnoses    Dysuria        Relevant Orders    POCT Urinalysis Dipstick (Completed)    Urine Microscopic Only    CULTURE, URINE COMPREHENSIVE      A total of 25 minutes of face to face time was spent with patient more than half of which was spent in counselling about the above mentioned conditions  and coordination of care  I have discontinued Ms. Cassata's predniSONE. I am also having her start on nitroGLYCERIN. Additionally, I am having her maintain her folic acid, hydroxychloroquine, aspirin, calcium-vitamin D, multivitamin with minerals, methotrexate, polyethylene glycol, and alendronate.  Meds ordered this encounter  Medications  . nitroGLYCERIN (NITROSTAT) 0.4 MG SL tablet    Sig: Place 1 tablet (0.4 mg  total) under the tongue every 5 (five) minutes as needed for chest pain.    Dispense:  50 tablet    Refill:  3    Medications Discontinued During This Encounter  Medication Reason  . predniSONE (DELTASONE) 5 MG tablet Error    Follow-up: No Follow-up on file.   Crecencio Mc, MD

## 2015-10-30 NOTE — Progress Notes (Signed)
Pre visit review using our clinic review tool, if applicable. No additional management support is needed unless otherwise documented below in the visit note. 

## 2015-10-30 NOTE — Patient Instructions (Signed)
You may have a urinary tract infection .  We will not start antibiotics until your results are final   You need to see Dr Saralyn Pilar about your chest pain  We have made the referral . If you have another episode,  You can take the nitroglcyerin tablet.  If it works,  The pain is more likely to be from heart.    I recommend getting the majority of your calcium and Vitamin D  through diet rather than supplements given the recent association of calcium supplements with increased coronary artery calcium scores   Try the almond , soy and cashew milks that most grocery stores  now carry  in the dairy  Section>   They are lactose free:  Silk brand Almond Light,  Original formula.  Delicious,  Low carb,  Low cal,  Cholesterol free  I  Or Silk unsweetened Vanilla flavored Soy Milk has more calcium and more protein than almond milk,

## 2015-10-31 ENCOUNTER — Encounter: Payer: Self-pay | Admitting: Internal Medicine

## 2015-10-31 DIAGNOSIS — R102 Pelvic and perineal pain: Secondary | ICD-10-CM | POA: Insufficient documentation

## 2015-10-31 NOTE — Assessment & Plan Note (Signed)
Bacterial vs interstitial cystiitis.  Patient does not want to start antibiotics unless bacterial infectio nis confirmed.

## 2015-10-31 NOTE — Assessment & Plan Note (Signed)
Patient needs definitive testing for CAD . Referral to [facility].

## 2015-11-01 DIAGNOSIS — R3129 Other microscopic hematuria: Secondary | ICD-10-CM | POA: Insufficient documentation

## 2015-11-01 DIAGNOSIS — D045 Carcinoma in situ of skin of trunk: Secondary | ICD-10-CM | POA: Diagnosis not present

## 2015-11-01 DIAGNOSIS — D485 Neoplasm of uncertain behavior of skin: Secondary | ICD-10-CM | POA: Diagnosis not present

## 2015-11-01 DIAGNOSIS — L814 Other melanin hyperpigmentation: Secondary | ICD-10-CM | POA: Diagnosis not present

## 2015-11-01 LAB — URINALYSIS, MICROSCOPIC ONLY
Bacteria, UA: NONE SEEN [HPF]
Casts: NONE SEEN [LPF]
Crystals: NONE SEEN [HPF]
Squamous Epithelial / LPF: NONE SEEN [HPF] (ref <=5)
WBC, UA: NONE SEEN WBC/HPF (ref <=5)
Yeast: NONE SEEN [HPF]

## 2015-11-01 NOTE — Addendum Note (Signed)
Addended by: Crecencio Mc on: 11/01/2015 01:22 PM   Modules accepted: Orders

## 2015-11-02 ENCOUNTER — Telehealth: Payer: Self-pay | Admitting: *Deleted

## 2015-11-02 DIAGNOSIS — E7800 Pure hypercholesterolemia, unspecified: Secondary | ICD-10-CM | POA: Diagnosis not present

## 2015-11-02 DIAGNOSIS — R079 Chest pain, unspecified: Secondary | ICD-10-CM | POA: Diagnosis not present

## 2015-11-02 DIAGNOSIS — I10 Essential (primary) hypertension: Secondary | ICD-10-CM | POA: Diagnosis not present

## 2015-11-02 NOTE — Telephone Encounter (Signed)
Patient has requested her lab results from 10/30/15 Pt contact [phone]

## 2015-11-02 NOTE — Telephone Encounter (Signed)
Left message on phone to return call back for lab results.

## 2015-11-03 ENCOUNTER — Other Ambulatory Visit: Payer: Self-pay | Admitting: Internal Medicine

## 2015-11-03 ENCOUNTER — Telehealth: Payer: Self-pay | Admitting: *Deleted

## 2015-11-03 DIAGNOSIS — N3010 Interstitial cystitis (chronic) without hematuria: Secondary | ICD-10-CM

## 2015-11-03 NOTE — Telephone Encounter (Signed)
Patient has returned call, she will be at [phone] today

## 2015-11-03 NOTE — Telephone Encounter (Signed)
Read previous message

## 2015-11-03 NOTE — Telephone Encounter (Signed)
Patient was informed of results. Patient had a past Urologist a Dr. Fredderick Phenix at [facility]. She would like to see him. Patient understood and no questions, comments, or concerns at this time.

## 2015-11-06 NOTE — Telephone Encounter (Addendum)
Patient requested a cal in regards to her labs, she also would like a copy of her labs to take to her Urology appt in the morning.  Pt contact [phone]

## 2015-11-06 NOTE — Telephone Encounter (Signed)
Confirmed results previously given.  Copy of labs printed and placed in front office for pick up.

## 2015-11-07 DIAGNOSIS — R3129 Other microscopic hematuria: Secondary | ICD-10-CM | POA: Diagnosis not present

## 2015-11-09 ENCOUNTER — Telehealth: Payer: Self-pay | Admitting: Internal Medicine

## 2015-11-09 DIAGNOSIS — R296 Repeated falls: Secondary | ICD-10-CM

## 2015-11-09 NOTE — Telephone Encounter (Signed)
Referral for recurrent falls,  There is no history of syncope ?  Referral in process

## 2015-11-09 NOTE — Telephone Encounter (Signed)
Spoke with patient. Got out of the tub, fell back of the tub.  Mark on shoulder, but no other injury.   No appetite, not sure why she fell.  Blood in urine having a  cystoscopy/ MRI coming up.  Feels like Dr. Manuella Ghazi may be able to help.  Her grandson sees him. thanks

## 2015-11-09 NOTE — Telephone Encounter (Signed)
No falls history per the patient, just this one time. Thanks.

## 2015-11-09 NOTE — Telephone Encounter (Signed)
Pt lvm requesting a referral to Dr. Manuella Ghazi. She states that she blacked out getting into her tub and would like to be seen by him.

## 2015-11-10 ENCOUNTER — Emergency Department: Payer: Medicare Other

## 2015-11-10 ENCOUNTER — Emergency Department
Admission: EM | Admit: 2015-11-10 | Discharge: 2015-11-10 | Disposition: A | Discharge status: Home / Self Care | Payer: Medicare Other | Attending: Emergency Medicine | Admitting: Emergency Medicine

## 2015-11-10 ENCOUNTER — Encounter: Payer: Self-pay | Admitting: Emergency Medicine

## 2015-11-10 DIAGNOSIS — N390 Urinary tract infection, site not specified: Secondary | ICD-10-CM | POA: Diagnosis not present

## 2015-11-10 DIAGNOSIS — R42 Dizziness and giddiness: Secondary | ICD-10-CM

## 2015-11-10 DIAGNOSIS — Y939 Activity, unspecified: Secondary | ICD-10-CM | POA: Diagnosis not present

## 2015-11-10 DIAGNOSIS — Y929 Unspecified place or not applicable: Secondary | ICD-10-CM | POA: Insufficient documentation

## 2015-11-10 DIAGNOSIS — M810 Age-related osteoporosis without current pathological fracture: Secondary | ICD-10-CM | POA: Insufficient documentation

## 2015-11-10 DIAGNOSIS — W19XXXA Unspecified fall, initial encounter: Secondary | ICD-10-CM

## 2015-11-10 DIAGNOSIS — S0990XA Unspecified injury of head, initial encounter: Secondary | ICD-10-CM | POA: Diagnosis not present

## 2015-11-10 DIAGNOSIS — Z7982 Long term (current) use of aspirin: Secondary | ICD-10-CM | POA: Insufficient documentation

## 2015-11-10 DIAGNOSIS — Z79899 Other long term (current) drug therapy: Secondary | ICD-10-CM | POA: Insufficient documentation

## 2015-11-10 DIAGNOSIS — M069 Rheumatoid arthritis, unspecified: Secondary | ICD-10-CM | POA: Insufficient documentation

## 2015-11-10 DIAGNOSIS — Y999 Unspecified external cause status: Secondary | ICD-10-CM | POA: Insufficient documentation

## 2015-11-10 DIAGNOSIS — W01198A Fall on same level from slipping, tripping and stumbling with subsequent striking against other object, initial encounter: Secondary | ICD-10-CM | POA: Diagnosis not present

## 2015-11-10 LAB — CBC
HCT: 39.5 % (ref 35.0–47.0)
Hemoglobin: 13.3 g/dL (ref 12.0–16.0)
MCH: 32.2 pg (ref 26.0–34.0)
MCHC: 33.8 g/dL (ref 32.0–36.0)
MCV: 95.2 fL (ref 80.0–100.0)
Platelets: 217 10*3/uL (ref 150–440)
RBC: 4.15 MIL/uL (ref 3.80–5.20)
RDW: 13.9 % (ref 11.5–14.5)
WBC: 5.7 10*3/uL (ref 3.6–11.0)

## 2015-11-10 LAB — BASIC METABOLIC PANEL
Anion gap: 9 (ref 5–15)
BUN: 13 mg/dL (ref 6–20)
CO2: 27 mmol/L (ref 22–32)
Calcium: 8.7 mg/dL — ABNORMAL LOW (ref 8.9–10.3)
Chloride: 99 mmol/L — ABNORMAL LOW (ref 101–111)
Creatinine, Ser: 0.61 mg/dL (ref 0.44–1.00)
GFR calc Af Amer: >60 mL/min (ref >60)
GFR calc non Af Amer: >60 mL/min (ref >60)
Glucose, Bld: 118 mg/dL — ABNORMAL HIGH (ref 65–99)
Potassium: 4.1 mmol/L (ref 3.5–5.1)
Sodium: 135 mmol/L (ref 135–145)

## 2015-11-10 LAB — URINALYSIS COMPLETE WITH MICROSCOPIC (ARMC ONLY)
Bilirubin Urine: NEGATIVE
Glucose, UA: NEGATIVE mg/dL
Ketones, ur: NEGATIVE mg/dL
Nitrite: NEGATIVE
Protein, ur: NEGATIVE mg/dL
Specific Gravity, Urine: 1.006 (ref 1.005–1.030)
pH: 7 (ref 5.0–8.0)

## 2015-11-10 LAB — TROPONIN I: Troponin I: <0.03 ng/mL (ref <0.03)

## 2015-11-10 MED ORDER — ONDANSETRON HCL 4 MG PO TABS: 4.0000 mg | ORAL_TABLET | Freq: Every day | ORAL | Status: DC | PRN

## 2015-11-10 MED ORDER — NITROFURANTOIN MONOHYD MACRO 100 MG PO CAPS: 100.0000 mg | ORAL_CAPSULE | Freq: Two times a day (BID) | ORAL | Status: DC

## 2015-11-10 MED ORDER — SULFAMETHOXAZOLE-TRIMETHOPRIM 800-160 MG PO TABS: 1.0000 | ORAL_TABLET | Freq: Two times a day (BID) | ORAL | Status: DC

## 2015-11-10 NOTE — Discharge Instructions (Signed)
Dizziness Dizziness is a common problem. It is a feeling of unsteadiness or light-headedness. You may feel like you are about to faint. Dizziness can lead to injury if you stumble or fall. Anyone can become dizzy, but dizziness is more common in older adults. This condition can be caused by a number of things, including medicines, dehydration, or illness. HOME CARE INSTRUCTIONS Taking these steps may help with your condition: Eating and Drinking  Drink enough fluid to keep your urine clear or pale yellow. This helps to keep you from becoming dehydrated. Try to drink more clear fluids, such as water.  Do not drink alcohol.  Limit your caffeine intake if directed by your health care provider.  Limit your salt intake if directed by your health care provider. Activity  Avoid making quick movements.  Rise slowly from chairs and steady yourself until you feel okay.  In the morning, first sit up on the side of the bed. When you feel okay, stand slowly while you hold onto something until you know that your balance is fine.  Move your legs often if you need to stand in one place for a long time. Tighten and relax your muscles in your legs while you are standing.  Do not drive or operate heavy machinery if you feel dizzy.  Avoid bending down if you feel dizzy. Place items in your home so that they are easy for you to reach without leaning over. Lifestyle  Do not use any tobacco products, including cigarettes, chewing tobacco, or electronic cigarettes. If you need help quitting, ask your health care provider.  Try to reduce your stress level, such as with yoga or meditation. Talk with your health care provider if you need help. General Instructions  Watch your dizziness for any changes.  Take medicines only as directed by your health care provider. Talk with your health care provider if you think that your dizziness is caused by a medicine that you are taking.  Tell a friend or a family  member that you are feeling dizzy. If he or she notices any changes in your behavior, have this person call your health care provider.  Keep all follow-up visits as directed by your health care provider. This is important. SEEK MEDICAL CARE IF:  Your dizziness does not go away.  Your dizziness or light-headedness gets worse.  You feel nauseous.  You have reduced hearing.  You have new symptoms.  You are unsteady on your feet or you feel like the room is spinning. SEEK IMMEDIATE MEDICAL CARE IF:  You vomit or have diarrhea and are unable to eat or drink anything.  You have problems talking, walking, swallowing, or using your arms, hands, or legs.  You feel generally weak.  You are not thinking clearly or you have trouble forming sentences. It may take a friend or family member to notice this.  You have chest pain, abdominal pain, shortness of breath, or sweating.  Your vision changes.  You notice any bleeding.  You have a headache.  You have neck pain or a stiff neck.  You have a fever.   This information is not intended to replace advice given to you by your health care provider. Make sure you discuss any questions you have with your health care provider.   Document Released: 10/23/2000 Document Revised: 09/13/2014 Document Reviewed: 04/25/2014 Elsevier Interactive Patient Education 2016 [place] Injury, Adult You have a head injury. Headaches and throwing up (vomiting) are common after a head  injury. It should be easy to wake up from sleeping. Sometimes you must stay in the hospital. Most problems happen within the first 24 hours. Side effects may occur up to 7-10 days after the injury.  WHAT ARE THE TYPES OF HEAD INJURIES? Head injuries can be as minor as a bump. Some head injuries can be more severe. More severe head injuries include:  A jarring injury to the brain (concussion).  A bruise of the brain (contusion). This mean there is bleeding in the  brain that can cause swelling.  A cracked skull (skull fracture).  Bleeding in the brain that collects, clots, and forms a bump (hematoma). WHEN SHOULD I GET HELP RIGHT AWAY?   You are confused or sleepy.  You cannot be woken up.  You feel sick to your stomach (nauseous) or keep throwing up (vomiting).  Your dizziness or unsteadiness is getting worse.  You have very bad, lasting headaches that are not helped by medicine. Take medicines only as told by your doctor.  You cannot use your arms or legs like normal.  You cannot walk.  You notice changes in the black spots in the center of the colored part of your eye (pupil).  You have clear or bloody fluid coming from your nose or ears.  You have trouble seeing. During the next 24 hours after the injury, you must stay with someone who can watch you. This person should get help right away (call 911 in the [place] if you start to shake and are not able to control it (have seizures), you pass out, or you are unable to wake up. HOW CAN I PREVENT A HEAD INJURY IN THE FUTURE?  Wear seat belts.  Wear a helmet while bike riding and playing sports like football.  Stay away from dangerous activities around the house. WHEN CAN I RETURN TO NORMAL ACTIVITIES AND ATHLETICS? See your doctor before doing these activities. You should not do normal activities or play contact sports until 1 week after the following symptoms have stopped:  Headache that does not go away.  Dizziness.  Poor attention.  Confusion.  Memory problems.  Sickness to your stomach or throwing up.  Tiredness.  Fussiness.  Bothered by bright lights or loud noises.  Anxiousness or depression.  Restless sleep. MAKE SURE YOU:   Understand these instructions.  Will watch your condition.  Will get help right away if you are not doing well or get worse.   This information is not intended to replace advice given to you by your health care provider. Make sure you  discuss any questions you have with your health care provider.   Document Released: 04/11/2008 Document Revised: 05/20/2014 Document Reviewed: 01/04/2013 Elsevier Interactive Patient Education 2016 Elsevier Inc[employer]  Urinary Tract Infection Urinary tract infections (UTIs) can develop anywhere along your urinary tract. Your urinary tract is your body's drainage system for removing wastes and extra water. Your urinary tract includes two kidneys, two ureters, a bladder, and a urethra. Your kidneys are a pair of bean-shaped organs. Each kidney is about the size of your fist. They are located below your ribs, one on each side of your spine. CAUSES Infections are caused by microbes, which are microscopic organisms, including fungi, viruses, and bacteria. These organisms are so small that they can only be seen through a microscope. Bacteria are the microbes that most commonly cause UTIs. SYMPTOMS  Symptoms of UTIs may vary by age and gender of the patient and by the location of the  infection. Symptoms in young women typically include a frequent and intense urge to urinate and a painful, burning feeling in the bladder or urethra during urination. Older women and men are more likely to be tired, shaky, and weak and have muscle aches and abdominal pain. A fever may mean the infection is in your kidneys. Other symptoms of a kidney infection include pain in your back or sides below the ribs, nausea, and vomiting. DIAGNOSIS To diagnose a UTI, your caregiver will ask you about your symptoms. Your caregiver will also ask you to provide a urine sample. The urine sample will be tested for bacteria and white blood cells. White blood cells are made by your body to help fight infection. TREATMENT  Typically, UTIs can be treated with medication. Because most UTIs are caused by a bacterial infection, they usually can be treated with the use of antibiotics. The choice of antibiotic and length of treatment depend on your  symptoms and the type of bacteria causing your infection. HOME CARE INSTRUCTIONS  If you were prescribed antibiotics, take them exactly as your caregiver instructs you. Finish the medication even if you feel better after you have only taken some of the medication.  Drink enough water and fluids to keep your urine clear or pale yellow.  Avoid caffeine, tea, and carbonated beverages. They tend to irritate your bladder.  Empty your bladder often. Avoid holding urine for long periods of time.  Empty your bladder before and after sexual intercourse.  After a bowel movement, women should cleanse from front to back. Use each tissue only once. SEEK MEDICAL CARE IF:   You have back pain.  You develop a fever.  Your symptoms do not begin to resolve within 3 days. SEEK IMMEDIATE MEDICAL CARE IF:   You have severe back pain or lower abdominal pain.  You develop chills.  You have nausea or vomiting.  You have continued burning or discomfort with urination. MAKE SURE YOU:   Understand these instructions.  Will watch your condition.  Will get help right away if you are not doing well or get worse.   This information is not intended to replace advice given to you by your health care provider. Make sure you discuss any questions you have with your health care provider.   Document Released: 02/06/2005 Document Revised: 01/18/2015 Document Reviewed: 06/07/2011 Elsevier Interactive Patient Education [employer].

## 2015-11-10 NOTE — ED Provider Notes (Addendum)
[facility] Emergency Department Provider Note        Time seen: ----------------------------------------- 1:28 PM on 11/10/2015 -----------------------------------------    I have reviewed the triage vital signs and the nursing notes.   HISTORY  Chief Complaint Fall and Head Injury    HPI Teresa Lin is a 82 y.o. female brought to the ER for evaluation related to a fall on Tuesday night. Patient states she was out of the bathtub and somehow she fell back into it. Patient thinks she tripped. She's not sure she blacked out, had a little bit of nausea. Patient reports hitting her head behind her left ear, reports being fatigued since she fell. She denies any recent illness, states she's had intermittent dizziness for 40 years. Typically she takes meclizine for dizziness.   Past Medical History  Diagnosis Date  . Arthritis   . Phlebitis and thrombophlebitis of deep veins of lower extremities (HCC)     after surgery  . Colon polyps   . Collagen vascular disease [facility])     Patient Active Problem List   Diagnosis Date Noted  . Hematuria, microscopic 11/01/2015  . Acute suprapubic pain 10/31/2015  . Left shoulder pain 10/13/2015  . Venous stasis ulcer of right lower extremity ([place]) 08/08/2015  . Compression fracture of L2 ([place]) 06/05/2015  . Constipation 05/30/2015  . Osteoporosis, postmenopausal 05/30/2015  . Compression fracture of L2 lumbar vertebra with delayed healing 05/29/2015  . Frequent falls 03/26/2015  . Atherosclerosis of arteries 03/26/2015  . Chest pain 02/09/2015  . Medicare annual wellness visit, subsequent 10/01/2014  . Family history of colon cancer 09/28/2014  . Tenesmus (rectal) 06/12/2014  . TMJ (temporomandibular joint syndrome) 06/12/2014  . Varicose veins of both lower extremities without ulcer or inflammation 06/12/2014  . B12 deficiency 09/20/2013  . Internal hemorrhoids without complication [id number]  . Insomnia  03/03/2013  . Vertigo due to cerebrovascular disease 03/03/2013  . Rheumatoid arthritis ([place]) 03/03/2013    Past Surgical History  Procedure Laterality Date  . Appendectomy    . Joint replacement Bilateral     knee replacement    Allergies Infliximab and Synvisc  Social History Social History  Substance Use Topics  . Smoking status: Never Smoker   . Smokeless tobacco: Never Used  . Alcohol Use: 1.2 oz/week    2 Cans of beer per week     Comment: occasionally     Review of Systems Constitutional: Negative for fever. Cardiovascular: Negative for chest pain. Respiratory: Negative for shortness of breath. Gastrointestinal: Negative for abdominal pain, vomiting and diarrhea. Genitourinary: Negative for dysuria. Musculoskeletal: Negative for back pain. Skin: Negative for rash. Neurological: Positive for dizziness  10-point ROS otherwise negative.  ____________________________________________   PHYSICAL EXAM:  VITAL SIGNS: ED Triage Vitals  Enc Vitals Group     BP 11/10/15 1132 127/73 mmHg     Pulse Rate 11/10/15 1132 83     Resp 11/10/15 1132 18     Temp 11/10/15 1132 98.3 F (36.8 C)     Temp Source 11/10/15 1132 Oral     SpO2 11/10/15 1132 100 %     Weight 11/10/15 1132 138 lb (62.596 kg)     Height 11/10/15 1132 5\' 6"  (1.676 m)     Head Cir --      Peak Flow --      Pain Score 11/10/15 1132 1     Pain Loc --      Pain Edu? --  Excl. in [place]? --     Constitutional: Alert and oriented. Well appearing and in no distress. Eyes: Conjunctivae are normal. PERRL. Normal extraocular movements. ENT   Head: Normocephalic and atraumatic.   Nose: No congestion/rhinnorhea.   Mouth/Throat: Mucous membranes are moist.   Neck: No stridor. Cardiovascular: Normal rate, regular rhythm. No murmurs, rubs, or gallops. Respiratory: Normal respiratory effort without tachypnea nor retractions. Breath sounds are clear and equal bilaterally. No  wheezes/rales/rhonchi. Gastrointestinal: Soft and nontender. Normal bowel sounds Musculoskeletal: Nontender with normal range of motion in all extremities. No lower extremity tenderness nor edema. Neurologic:  Normal speech and language. No gross focal neurologic deficits are appreciated. Negative Romberg, strength and sensation appears normal, cranial nerves intact. Skin:  Skin is warm, dry and intact. No rash noted. Psychiatric: Mood and affect are normal. Speech and behavior are normal.  ____________________________________________  EKG: Interpreted by me.Sinus rhythm with sinus arrhythmia, rate is 81 bpm, wide QRS, normal QT interval. Right bundle branch block.  ____________________________________________  ED COURSE:  Pertinent labs & imaging results that were available during my care of the patient were reviewed by me and considered in my medical decision making (see chart for details). Patient is in no acute distress, I will check basic labs and CT imaging. ____________________________________________    LABS (pertinent positives/negatives)  Labs Reviewed  BASIC METABOLIC PANEL - Abnormal; Notable for the following:    Chloride 99 (*)    Glucose, Bld 118 (*)    Calcium 8.7 (*)    All other components within normal limits  URINALYSIS COMPLETEWITH MICROSCOPIC ([facility] ONLY) - Abnormal; Notable for the following:    Color, Urine YELLOW (*)    APPearance CLEAR (*)    Hgb urine dipstick 2+ (*)    Leukocytes, UA 1+ (*)    Bacteria, UA RARE (*)    Squamous Epithelial / LPF 0-5 (*)    All other components within normal limits  CBC  TROPONIN I    RADIOLOGY  IMPRESSION: 1. Moderate scattered periventricular and subcortical white matter disease bilaterally. This was reported previously and likely reflects the sequela of chronic microvascular ischemia. 2. No acute intracranial abnormality. No evidence for acute trauma.  ____________________________________________  FINAL  ASSESSMENT AND PLAN  Dizziness, moderate head injury, UTI  Plan: Patient with labs and imaging as dictated above. Workup to this point has been unremarkable other than a UTI. Patient will be referred to neurology for outpatient follow-up, I will start her on antibiotics for cystitis.   Earleen Newport, MD   Note: This dictation was prepared with Dragon dictation. Any transcriptional errors that result from this process are unintentional   Earleen Newport, MD 11/10/15 1346  Earleen Newport, MD 11/10/15 1420

## 2015-11-10 NOTE — Telephone Encounter (Signed)
Walk-in patient.  Presented today with pain on left side of head, left shoulder, left leg. Reports incident listed below.  No appointments available on schedule.  Per Dr. Thomes Dinning recommendation she has gone to [facility] to be seen.

## 2015-11-10 NOTE — ED Notes (Signed)
AAOx3.  Skin warm and dry.  NAD 

## 2015-11-10 NOTE — ED Notes (Signed)
Patient presents to the ED post fall on Tuesday night.  Patient states, "I was out of the bathtub and then somehow I fell back in.  I don't know if I passed out, blacked out, or what."  Patient reports hitting her head behind her left ear.  Patient states since her fall she has been very tired and states, "I just want to know if I have a concussion."

## 2015-11-13 ENCOUNTER — Encounter: Payer: Self-pay | Admitting: Internal Medicine

## 2015-11-13 ENCOUNTER — Ambulatory Visit (INDEPENDENT_AMBULATORY_CARE_PROVIDER_SITE_OTHER): Payer: Medicare Other | Admitting: Internal Medicine

## 2015-11-13 VITALS — BP 126/64 | HR 86 | Temp 98.3°F | Resp 12 | Ht 67.0 in | Wt 137.0 lb

## 2015-11-13 DIAGNOSIS — R1030 Lower abdominal pain, unspecified: Secondary | ICD-10-CM

## 2015-11-13 DIAGNOSIS — L03116 Cellulitis of left lower limb: Secondary | ICD-10-CM

## 2015-11-13 DIAGNOSIS — R296 Repeated falls: Secondary | ICD-10-CM | POA: Diagnosis not present

## 2015-11-13 DIAGNOSIS — R110 Nausea: Secondary | ICD-10-CM | POA: Diagnosis not present

## 2015-11-13 MED ORDER — PANTOPRAZOLE SODIUM 40 MG PO TBEC: 40.0000 mg | DELAYED_RELEASE_TABLET | Freq: Every day | ORAL | Status: DC

## 2015-11-13 MED ORDER — DOXYCYCLINE HYCLATE 100 MG PO CAPS: 100.0000 mg | ORAL_CAPSULE | Freq: Two times a day (BID) | ORAL | Status: DC

## 2015-11-13 MED ORDER — PREDNISONE 10 MG PO TABS: ORAL_TABLET | ORAL | Status: DC

## 2015-11-13 NOTE — Patient Instructions (Addendum)
Stop the nitrofurantoin  Start doxycycline, twice daily with food  Please take a probiotic ( Align, Floraque or Culturelle), the generic version of one of these over the counter medications, or an alternative form (kombucha,  Yogurt, or another dietary source) for a minimum of 3 weeks to prevent a serious antibiotic associated diarrhea  Called clostridium dificile colitis.  Taking a probiotic may also prevent vaginitis due to yeast infections and can be continued indefinitely if you feel that it improves your digestion or your elimination (bowels).     Suspend the methotrexate and the alendronate for 2 weeks   Use a prednisone taper if you have an arthritis flare   Use the Zofran as needed for nausea  Start protonix once daily in the morning before you eat for gastritis   If you develop abdominal cramping or burning,  Return  to office to provide a urine sample

## 2015-11-13 NOTE — Progress Notes (Signed)
Subjective:  Patient ID: Teresa Lin, female    DOB: 1931-08-18  Age: 82 y.o. MRN: [id number]  CC: The primary encounter diagnosis was Suprapubic pain, acute, unspecified laterality. Diagnoses of Cellulitis of leg, left, Acute suprapubic pain, unspecified laterality, Frequent falls, and Nausea without vomiting were also pertinent to this visit.  HPI SHANNIE BERROA presents for follow up on ER visit (her second in 1 month) and multiple issues  1) treated on June 30th by ED for one day history of nausea and malise that started seereal days after a fall which occurred unwitnessed at home while getting out of the bathtub.  Head CT done , no SAH.   Changes suggestive of chronic microvascular ischemia.  Labs were done and she was treated for UTI due to reporting suprapubic pain and nausea without dysuria. No urine culture done.  Patient was  Prescribed 2 antibiotics with no explanation why. chart shows that both macrobid and Septra were prescribed but she did not pick up septra and has been taking only Macrobid. . She has been nauseated  but has not started the Zofran that was also prescribed for persistent nausea.   2) Urology referral after last visit for workup of hematuria without infection,  Suspected probable interstitial cystitis  Cystoscopy planned for August 8     3) new rash on left posteriori calf.  first noticed on Saturday after spending the day at her [place].  did not remember pulling off a tick but has had recurrent tick expoure .  Area has been warm and itchy.  Denies that the red rash has spread.  Outpatient Prescriptions Prior to Visit  Medication Sig Dispense Refill  . alendronate (FOSAMAX) 70 MG tablet Take by mouth. Reported on 11/13/2015    . aspirin 81 MG tablet Take 81 mg by mouth daily.    . folic acid (FOLVITE) 1 MG tablet Take 1 mg by mouth daily.     . hydroxychloroquine (PLAQUENIL) 200 MG tablet Take 200 mg by mouth daily.    . methotrexate 50 MG/2ML injection Inject  into the skin.    . Multiple Vitamins-Minerals (MULTIVITAMIN WITH MINERALS) tablet Take 1 tablet by mouth daily.    . nitrofurantoin, macrocrystal-monohydrate, (MACROBID) 100 MG capsule Take 1 capsule (100 mg total) by mouth 2 (two) times daily. 20 capsule 0  . nitroGLYCERIN (NITROSTAT) 0.4 MG SL tablet Place 1 tablet (0.4 mg total) under the tongue every 5 (five) minutes as needed for chest pain. 50 tablet 3  . calcium-vitamin D (OSCAL WITH D) 500-200 MG-UNIT per tablet Take 1 tablet by mouth. Reported on 11/13/2015    . ondansetron (ZOFRAN) 4 MG tablet Take 1 tablet (4 mg total) by mouth daily as needed for nausea or vomiting. (Patient not taking: Reported on 11/13/2015) 20 tablet 1  . polyethylene glycol (GOLYTELY) 236 g solution Drink 8 ounces every 30 minutes until your constipation is relieved (Patient not taking: Reported on 11/13/2015) 4000 mL 0  . sulfamethoxazole-trimethoprim (BACTRIM DS) 800-160 MG tablet Take 1 tablet by mouth 2 (two) times daily. (Patient not taking: Reported on 11/13/2015) 20 tablet 0   No facility-administered medications prior to visit.    Review of Systems;  Patient denies headache, fevers, malaise, unintentional weight loss, skin rash, eye pain, sinus congestion and sinus pain, sore throat, dysphagia,  hemoptysis , cough, dyspnea, wheezing, chest pain, palpitations, orthopnea, edema, abdominal pain, nausea, melena, diarrhea, constipation, flank pain, dysuria, hematuria, urinary  Frequency, nocturia, numbness, tingling, seizures,  Focal weakness, Loss of consciousness,  Tremor, insomnia, depression, anxiety, and suicidal ideation.      Objective:  BP 126/64 mmHg  Pulse 86  Temp(Src) 98.3 F (36.8 C) (Oral)  Resp 12  Ht 5\' 7"  (1.702 m)  Wt 137 lb (62.143 kg)  BMI 21.45 kg/m2  SpO2 96%  BP Readings from Last 3 Encounters:  11/13/15 126/64  11/10/15 118/70  10/30/15 114/62    Wt Readings from Last 3 Encounters:  11/13/15 137 lb (62.143 kg)  11/10/15 138 lb  (62.596 kg)  10/30/15 142 lb 6.4 oz (64.592 kg)    General appearance: alert, cooperative and appears stated age Ears: normal TM's and external ear canals both ears Throat: lips, mucosa, and tongue normal; teeth and gums normal Neck: no adenopathy, no carotid bruit, supple, symmetrical, trachea midline and thyroid not enlarged, symmetric, no tenderness/mass/nodules Back: symmetric, no curvature. ROM normal. No CVA tenderness. Lungs: clear to auscultation bilaterally Heart: regular rate and rhythm, S1, S2 normal, no murmur, click, rub or gallop Abdomen: soft, non-tender; bowel sounds normal; no masses,  no organomegaly Pulses: 2+ and symmetric Skin: 4 cm annular, warm red macular rash with central punctum vs excoriation left posterior distal calf.  Lymph  nodes: Cervical, supraclavicular, and axillary nodes normal.  No results found for: HGBA1C  Lab Results  Component Value Date   CREATININE 0.61 11/10/2015   CREATININE 0.51 10/13/2015   CREATININE 0.58 08/28/2015    Lab Results  Component Value Date   WBC 5.7 11/10/2015   HGB 13.3 11/10/2015   HCT 39.5 11/10/2015   PLT 217 11/10/2015   GLUCOSE 118* 11/10/2015   CHOL 181 08/28/2015   TRIG 83.0 08/28/2015   HDL 62.90 08/28/2015   LDLCALC 102* 08/28/2015   ALT 17 10/13/2015   AST 27 10/13/2015   NA 135 11/10/2015   K 4.1 11/10/2015   CL 99* 11/10/2015   CREATININE 0.61 11/10/2015   BUN 13 11/10/2015   CO2 27 11/10/2015   TSH 2.04 08/28/2015   INR 0.9 08/08/2011    Ct Head Wo Contrast  11/10/2015  CLINICAL DATA:  Fall in bathroom 4 days ago. No loss of consciousness. Increased weakness and nausea beginning yesterday. Initial encounter. EXAM: CT HEAD WITHOUT CONTRAST TECHNIQUE: Contiguous axial images were obtained from the base of the skull through the vertex without intravenous contrast. COMPARISON:  Report of MRI brain 07/21/2001. The images are no longer available. FINDINGS: Moderate diffuse periventricular and  subcortical white matter hypoattenuation is present bilaterally. Acute infarct, hemorrhage, or mass lesion is present. The basal ganglia are intact. Insular ribbon is normal bilaterally. No significant extra-axial fluid collection is present. Ventricles are proportionate to the degree of atrophy. The paranasal sinuses and mastoid air cells are clear. Bilateral lens replacements are noted. The globes and orbits are otherwise within normal limits. No significant extracranial soft tissue lesion is present. IMPRESSION: 1. Moderate scattered periventricular and subcortical white matter disease bilaterally. This was reported previously and likely reflects the sequela of chronic microvascular ischemia. 2. No acute intracranial abnormality.  No evidence for acute trauma. Electronically Signed   By: San Morelle M.D.   On: 11/10/2015 13:04    Assessment & Plan:   Problem List Items Addressed This Visit    Frequent falls    She has had two falls in the last year, and appears to have some mild cognitive deficits, and recurrent vertigo. CT head done in ED show chronic microvascular disease.   Neurology referral requested and  in process      Acute suprapubic pain    Recurrent, with prior evaluation negative for infection but positive for hematuria.  Advised to stop the macorbi.  Repeat UA and culture obtained today  Cystoscopy planned.       Nausea without vomiting    Etiology unlear,  Ddx includes concussion, gastritis,  Cystitis.  rx zofran and protonix.       Cellulitis of leg, left    Vs fixed drug reaction,  Given history of tick exposure,  Starting doxy empirically.   Stop macrobid.        Other Visit Diagnoses    Suprapubic pain, acute, unspecified laterality    -  Primary    Relevant Orders    Urine culture    Urinalysis, Routine w reflex microscopic (not at [facility])       I have discontinued Ms. Cromie's polyethylene glycol and sulfamethoxazole-trimethoprim. I am also having her start  on doxycycline, predniSONE, and pantoprazole. Additionally, I am having her maintain her folic acid, hydroxychloroquine, aspirin, calcium-vitamin D, multivitamin with minerals, methotrexate, alendronate, nitroGLYCERIN, nitrofurantoin (macrocrystal-monohydrate), and ondansetron.  Meds ordered this encounter  Medications  . doxycycline (VIBRAMYCIN) 100 MG capsule    Sig: Take 1 capsule (100 mg total) by mouth 2 (two) times daily.    Dispense:  14 capsule    Refill:  0  . predniSONE (DELTASONE) 10 MG tablet    Sig: 6 tablets on Day 1 , then reduce by 1 tablet daily until gone    Dispense:  21 tablet    Refill:  0  . pantoprazole (PROTONIX) 40 MG tablet    Sig: Take 1 tablet (40 mg total) by mouth daily.    Dispense:  30 tablet    Refill:  3   A total of 40 minutes was spent with patient more than half of which was spent in counseling patient on the above mentioned issues , reviewing and explaining recent labs and imaging studies done, and coordination of care. Medications Discontinued During This Encounter  Medication Reason  . sulfamethoxazole-trimethoprim (BACTRIM DS) 800-160 MG tablet   . polyethylene glycol (GOLYTELY) 236 g solution     Follow-up: No Follow-up on file.   Crecencio Mc, MD

## 2015-11-13 NOTE — Progress Notes (Signed)
Pre-visit discussion using our clinic review tool. No additional management support is needed unless otherwise documented below in the visit note.  

## 2015-11-13 NOTE — Assessment & Plan Note (Signed)
Vs fixed drug reaction,  Given history of tick exposure,  Starting doxy empirically.   Stop macrobid.

## 2015-11-14 DIAGNOSIS — R110 Nausea: Secondary | ICD-10-CM | POA: Insufficient documentation

## 2015-11-14 NOTE — Assessment & Plan Note (Addendum)
Etiology unlear,  Ddx includes concussion, gastritis,  Cystitis.  rx zofran and protonix.

## 2015-11-14 NOTE — Assessment & Plan Note (Signed)
Recurrent, with prior evaluation negative for infection but positive for hematuria.  Advised to stop the macorbi.  Repeat UA and culture obtained today  Cystoscopy planned.

## 2015-11-14 NOTE — Assessment & Plan Note (Addendum)
She has had two falls in the last year, and appears to have some mild cognitive deficits, and recurrent vertigo. CT head done in ED show chronic microvascular disease.   Neurology referral requested and in process

## 2015-11-15 ENCOUNTER — Encounter: Payer: Self-pay | Admitting: Internal Medicine

## 2015-11-15 ENCOUNTER — Ambulatory Visit (INDEPENDENT_AMBULATORY_CARE_PROVIDER_SITE_OTHER): Payer: Medicare Other | Admitting: Internal Medicine

## 2015-11-15 VITALS — BP 132/64 | HR 84 | Temp 98.1°F | Resp 12 | Ht 67.0 in | Wt 138.5 lb

## 2015-11-15 DIAGNOSIS — R3129 Other microscopic hematuria: Secondary | ICD-10-CM | POA: Diagnosis not present

## 2015-11-15 DIAGNOSIS — R110 Nausea: Secondary | ICD-10-CM | POA: Diagnosis not present

## 2015-11-15 DIAGNOSIS — L03116 Cellulitis of left lower limb: Secondary | ICD-10-CM | POA: Diagnosis not present

## 2015-11-15 LAB — URINALYSIS, ROUTINE W REFLEX MICROSCOPIC
Bilirubin Urine: NEGATIVE
Ketones, ur: NEGATIVE
Nitrite: NEGATIVE
Specific Gravity, Urine: <=1.005 — AB (ref 1.000–1.030)
Total Protein, Urine: NEGATIVE
Urine Glucose: NEGATIVE
Urobilinogen, UA: 0.2 (ref 0.0–1.0)
pH: 6.5 (ref 5.0–8.0)

## 2015-11-15 NOTE — Patient Instructions (Signed)
We will check your tick antibody titers in mid August  Finish the docys and use the zofran and probiotics  Protonix treats gastritis   If you have a mild concussion you should limit reading to an hour daily ,  Television too  Continue to drink 48 ounces of water daiy

## 2015-11-15 NOTE — Progress Notes (Signed)
Pre-visit discussion using our clinic review tool. No additional management support is needed unless otherwise documented below in the visit note.  

## 2015-11-16 LAB — URINE CULTURE
Colony Count: NO GROWTH
Organism ID, Bacteria: NO GROWTH

## 2015-11-16 NOTE — Progress Notes (Signed)
Subjective:  Patient ID: Teresa Lin, female    DOB: Mar 05, 1932  Age: 82 y.o. MRN: [id number]  CC: The primary encounter diagnosis was Cellulitis of leg, left. Diagnoses of Hematuria, microscopic and Nausea without vomiting were also pertinent to this visit.  HPI Teresa Lin presents for follow up on cellulitis noted on left calf posteriorly.  Initially seen two days ago and treated as tick bite given tick exposure.  Rash was macular and warm with central papule bis punctum .    She continues to feel nauseated but has been raking doxy, protnoxi and zofran.  No fevers. , dysuria or sprapubic pain .  Outpatient Prescriptions Prior to Visit  Medication Sig Dispense Refill  . alendronate (FOSAMAX) 70 MG tablet Take by mouth. Reported on 11/13/2015    . aspirin 81 MG tablet Take 81 mg by mouth daily.    . calcium-vitamin D (OSCAL WITH D) 500-200 MG-UNIT per tablet Take 1 tablet by mouth. Reported on 11/13/2015    . doxycycline (VIBRAMYCIN) 100 MG capsule Take 1 capsule (100 mg total) by mouth 2 (two) times daily. 14 capsule 0  . folic acid (FOLVITE) 1 MG tablet Take 1 mg by mouth daily.     . hydroxychloroquine (PLAQUENIL) 200 MG tablet Take 200 mg by mouth daily.    . methotrexate 50 MG/2ML injection Inject into the skin.    . Multiple Vitamins-Minerals (MULTIVITAMIN WITH MINERALS) tablet Take 1 tablet by mouth daily.    . nitrofurantoin, macrocrystal-monohydrate, (MACROBID) 100 MG capsule Take 1 capsule (100 mg total) by mouth 2 (two) times daily. 20 capsule 0  . nitroGLYCERIN (NITROSTAT) 0.4 MG SL tablet Place 1 tablet (0.4 mg total) under the tongue every 5 (five) minutes as needed for chest pain. 50 tablet 3  . ondansetron (ZOFRAN) 4 MG tablet Take 1 tablet (4 mg total) by mouth daily as needed for nausea or vomiting. 20 tablet 1  . pantoprazole (PROTONIX) 40 MG tablet Take 1 tablet (40 mg total) by mouth daily. 30 tablet 3  . predniSONE (DELTASONE) 10 MG tablet 6 tablets on Day 1 , then reduce  by 1 tablet daily until gone 21 tablet 0   No facility-administered medications prior to visit.    Review of Systems;  Patient denies headache, fevers, malaise, unintentional weight loss, skin rash, eye pain, sinus congestion and sinus pain, sore throat, dysphagia,  hemoptysis , cough, dyspnea, wheezing, chest pain, palpitations, orthopnea, edema, abdominal pain, nausea, melena, diarrhea, constipation, flank pain, dysuria, hematuria, urinary  Frequency, nocturia, numbness, tingling, seizures,  Focal weakness, Loss of consciousness,  Tremor, insomnia, depression, anxiety, and suicidal ideation.      Objective:  BP 132/64 mmHg  Pulse 84  Temp(Src) 98.1 F (36.7 C) (Oral)  Resp 12  Ht 5\' 7"  (1.702 m)  Wt 138 lb 8 oz (62.823 kg)  BMI 21.69 kg/m2  SpO2 96%  BP Readings from Last 3 Encounters:  11/15/15 132/64  11/13/15 126/64  11/10/15 118/70    Wt Readings from Last 3 Encounters:  11/15/15 138 lb 8 oz (62.823 kg)  11/13/15 137 lb (62.143 kg)  11/10/15 138 lb (62.596 kg)    General appearance: alert, cooperative and appears stated age Ears: normal TM's and external ear canals both ears Throat: lips, mucosa, and tongue normal; teeth and gums normal Neck: no adenopathy, no carotid bruit, supple, symmetrical, trachea midline and thyroid not enlarged, symmetric, no tenderness/mass/nodules Back: symmetric, no curvature. ROM normal. No CVA tenderness. Lungs: clear  to auscultation bilaterally Heart: regular rate and rhythm, S1, S2 normal, no murmur, click, rub or gallop Abdomen: soft, non-tender; bowel sounds normal; no masses,  no organomegaly Pulses: 2+ and symmetric Skin: Skin color, texture, turgor normal. No rashes or lesions Lymph nodes: Cervical, supraclavicular, and axillary nodes normal.  No results found for: HGBA1C  Lab Results  Component Value Date   CREATININE 0.61 11/10/2015   CREATININE 0.51 10/13/2015   CREATININE 0.58 08/28/2015    Lab Results  Component  Value Date   WBC 5.7 11/10/2015   HGB 13.3 11/10/2015   HCT 39.5 11/10/2015   PLT 217 11/10/2015   GLUCOSE 118* 11/10/2015   CHOL 181 08/28/2015   TRIG 83.0 08/28/2015   HDL 62.90 08/28/2015   LDLCALC 102* 08/28/2015   ALT 17 10/13/2015   AST 27 10/13/2015   NA 135 11/10/2015   K 4.1 11/10/2015   CL 99* 11/10/2015   CREATININE 0.61 11/10/2015   BUN 13 11/10/2015   CO2 27 11/10/2015   TSH 2.04 08/28/2015   INR 0.9 08/08/2011    Ct Head Wo Contrast  11/10/2015  CLINICAL DATA:  Fall in bathroom 4 days ago. No loss of consciousness. Increased weakness and nausea beginning yesterday. Initial encounter. EXAM: CT HEAD WITHOUT CONTRAST TECHNIQUE: Contiguous axial images were obtained from the base of the skull through the vertex without intravenous contrast. COMPARISON:  Report of MRI brain 07/21/2001. The images are no longer available. FINDINGS: Moderate diffuse periventricular and subcortical white matter hypoattenuation is present bilaterally. Acute infarct, hemorrhage, or mass lesion is present. The basal ganglia are intact. Insular ribbon is normal bilaterally. No significant extra-axial fluid collection is present. Ventricles are proportionate to the degree of atrophy. The paranasal sinuses and mastoid air cells are clear. Bilateral lens replacements are noted. The globes and orbits are otherwise within normal limits. No significant extracranial soft tissue lesion is present. IMPRESSION: 1. Moderate scattered periventricular and subcortical white matter disease bilaterally. This was reported previously and likely reflects the sequela of chronic microvascular ischemia. 2. No acute intracranial abnormality.  No evidence for acute trauma. Electronically Signed   By: San Morelle M.D.   On: 11/10/2015 13:04    Assessment & Plan:   Problem List Items Addressed This Visit    Hematuria, microscopic    No evidence of UTI.  Cystoscopy planned.       Cellulitis of leg, left - Primary     appears to be due to insect bite.  Continue doxycycline.        Nausea without vomiting    Starting Protonix for presumed gastritis, and prn zofran.  RTC one month          I am having Ms. Mikelson maintain her folic acid, hydroxychloroquine, aspirin, calcium-vitamin D, multivitamin with minerals, methotrexate, alendronate, nitroGLYCERIN, nitrofurantoin (macrocrystal-monohydrate), ondansetron, doxycycline, predniSONE, and pantoprazole.  No orders of the defined types were placed in this encounter.    There are no discontinued medications.  Follow-up: No Follow-up on file.   Crecencio Mc, MD

## 2015-11-18 NOTE — Assessment & Plan Note (Signed)
Starting Protonix for presumed gastritis, and prn zofran.  RTC one month

## 2015-11-18 NOTE — Assessment & Plan Note (Signed)
No evidence of UTI.  Cystoscopy planned.

## 2015-11-18 NOTE — Assessment & Plan Note (Signed)
appears to be due to insect bite.  Continue doxycycline.

## 2015-11-21 DIAGNOSIS — Z79899 Other long term (current) drug therapy: Secondary | ICD-10-CM | POA: Diagnosis not present

## 2015-11-21 DIAGNOSIS — R311 Benign essential microscopic hematuria: Secondary | ICD-10-CM | POA: Diagnosis not present

## 2015-11-21 DIAGNOSIS — M0609 Rheumatoid arthritis without rheumatoid factor, multiple sites: Secondary | ICD-10-CM | POA: Diagnosis not present

## 2015-11-21 DIAGNOSIS — R55 Syncope and collapse: Secondary | ICD-10-CM | POA: Diagnosis not present

## 2015-11-23 DIAGNOSIS — D045 Carcinoma in situ of skin of trunk: Secondary | ICD-10-CM | POA: Diagnosis not present

## 2015-12-19 DIAGNOSIS — N390 Urinary tract infection, site not specified: Secondary | ICD-10-CM | POA: Diagnosis not present

## 2015-12-19 DIAGNOSIS — R399 Unspecified symptoms and signs involving the genitourinary system: Secondary | ICD-10-CM | POA: Diagnosis not present

## 2015-12-19 DIAGNOSIS — B379 Candidiasis, unspecified: Secondary | ICD-10-CM | POA: Diagnosis not present

## 2015-12-19 DIAGNOSIS — R3129 Other microscopic hematuria: Secondary | ICD-10-CM | POA: Diagnosis not present

## 2015-12-22 ENCOUNTER — Telehealth: Payer: Self-pay | Admitting: Internal Medicine

## 2015-12-22 NOTE — Telephone Encounter (Signed)
Left message for patient to return phone call.  

## 2015-12-22 NOTE — Telephone Encounter (Signed)
The patient called to inform the physician that she has a yeast infection from taking antibiotics for a UTI. She took her fluconazole yesterday, but her yeast infection symptoms seem worst per the patient.

## 2015-12-22 NOTE — Telephone Encounter (Signed)
Please advise 

## 2015-12-24 NOTE — Telephone Encounter (Signed)
If she took 2 days of fluconazole and her symptoms are worse she will need a pelvic exam to determine the cause of persistent symptoms. Please offer her an appt with me or margaret

## 2015-12-25 NOTE — Telephone Encounter (Signed)
Patient has been informed and made aware

## 2015-12-26 ENCOUNTER — Other Ambulatory Visit: Payer: Self-pay | Admitting: Internal Medicine

## 2015-12-26 ENCOUNTER — Ambulatory Visit (INDEPENDENT_AMBULATORY_CARE_PROVIDER_SITE_OTHER): Payer: Medicare Other | Admitting: Family

## 2015-12-26 ENCOUNTER — Encounter: Payer: Self-pay | Admitting: Family

## 2015-12-26 VITALS — BP 128/62 | HR 96 | Temp 98.3°F | Ht 65.0 in | Wt 140.0 lb

## 2015-12-26 DIAGNOSIS — L298 Other pruritus: Secondary | ICD-10-CM | POA: Diagnosis not present

## 2015-12-26 DIAGNOSIS — N898 Other specified noninflammatory disorders of vagina: Secondary | ICD-10-CM

## 2015-12-26 DIAGNOSIS — R319 Hematuria, unspecified: Secondary | ICD-10-CM | POA: Diagnosis not present

## 2015-12-26 LAB — POCT URINALYSIS DIPSTICK
Bilirubin, UA: NEGATIVE
Glucose, UA: NEGATIVE
Ketones, UA: NEGATIVE
Nitrite, UA: NEGATIVE
Protein, UA: 30
Spec Grav, UA: 1.02
Urobilinogen, UA: 0.2
pH, UA: 7

## 2015-12-26 MED ORDER — FLUCONAZOLE 150 MG PO TABS: 150.0000 mg | ORAL_TABLET | Freq: Every day | ORAL | 1 refills | Status: DC

## 2015-12-26 MED ORDER — FLUCONAZOLE 150 MG PO TABS: 150.0000 mg | ORAL_TABLET | Freq: Once | ORAL | 1 refills | Status: DC

## 2015-12-26 MED ORDER — CEFDINIR 300 MG PO CAPS: 300.0000 mg | ORAL_CAPSULE | Freq: Two times a day (BID) | ORAL | 0 refills | Status: AC

## 2015-12-26 NOTE — Progress Notes (Signed)
Subjective:    Patient ID: Teresa Lin, female    DOB: 1932/01/19, 82 y.o.   MRN: [id number]  CC: Teresa Lin is a 82 y.o. female who presents today for follow up.   HPI: Patient here for an acute visit for 'yeast infection.' Describes vaginal itching and irriation. No fever, chills. No concern of STDs. Had one diflucan from urologist after cystopope from Teresa Lin one week ago ( pending these reports) . Burning after urinates. Recent antibiotic use.   No history of GYN cancer. No hysterectomy.      HISTORY:  Past Medical History:  Diagnosis Date  . Arthritis   . Collagen vascular disease (Teresa Lin)   . Colon polyps   . Phlebitis and thrombophlebitis of deep veins of lower extremities (Teresa Lin)    after surgery   Past Surgical History:  Procedure Laterality Date  . APPENDECTOMY    . JOINT REPLACEMENT Bilateral    knee replacement   Family History  Problem Relation Age of Onset  . Heart disease Mother   . Hypertension Mother   . Cancer Mother     colon ca  . Heart disease Father   . Hypertension Father   . Hypertension Sister   . Multiple sclerosis Daughter   . Multiple sclerosis Son   . Multiple sclerosis Son     Allergies: Infliximab and Synvisc [hylan [id number] 20] Current Outpatient Prescriptions on File Prior to Visit  Medication Sig Dispense Refill  . alendronate (FOSAMAX) 70 MG tablet Take by mouth. Reported on 11/13/2015    . aspirin 81 MG tablet Take 81 mg by mouth daily.    . calcium-vitamin D (OSCAL WITH D) 500-200 MG-UNIT per tablet Take 1 tablet by mouth. Reported on 11/13/2015    . doxycycline (VIBRAMYCIN) 100 MG capsule Take 1 capsule (100 mg total) by mouth 2 (two) times daily. 14 capsule 0  . folic acid (FOLVITE) 1 MG tablet Take 1 mg by mouth daily.     . hydroxychloroquine (PLAQUENIL) 200 MG tablet Take 200 mg by mouth daily.    . methotrexate 50 MG/2ML injection Inject into the skin.    . Multiple Vitamins-Minerals (MULTIVITAMIN WITH MINERALS) tablet Take 1 tablet by  mouth daily.    . nitrofurantoin, macrocrystal-monohydrate, (MACROBID) 100 MG capsule Take 1 capsule (100 mg total) by mouth 2 (two) times daily. 20 capsule 0  . nitroGLYCERIN (NITROSTAT) 0.4 MG SL tablet Place 1 tablet (0.4 mg total) under the tongue every 5 (five) minutes as needed for chest pain. 50 tablet 3  . ondansetron (ZOFRAN) 4 MG tablet Take 1 tablet (4 mg total) by mouth daily as needed for nausea or vomiting. 20 tablet 1  . pantoprazole (PROTONIX) 40 MG tablet Take 1 tablet (40 mg total) by mouth daily. 30 tablet 3  . predniSONE (DELTASONE) 10 MG tablet 6 tablets on Day 1 , then reduce by 1 tablet daily until gone 21 tablet 0   No current facility-administered medications on file prior to visit.     Social History  Substance Use Topics  . Smoking status: Never Smoker  . Smokeless tobacco: Never Used  . Alcohol use 1.2 oz/week    2 Cans of beer per week     Comment: occasionally     Review of Systems  Constitutional: Negative for chills and fever.  Respiratory: Negative for cough.   Cardiovascular: Negative for chest pain and palpitations.  Gastrointestinal: Negative for abdominal distention, abdominal pain, nausea and vomiting.  Genitourinary:  Negative for decreased urine volume, dysuria, flank pain, hematuria, vaginal bleeding, vaginal discharge and vaginal pain.      Objective:    BP 128/62   Pulse 96   Temp 98.3 F (36.8 C) (Oral)   Ht 5\' 5"  (1.651 m)   Wt 140 lb (63.5 kg)   SpO2 98%   BMI 23.30 kg/m  BP Readings from Last 3 Encounters:  12/26/15 128/62  11/15/15 132/64  11/13/15 126/64   Wt Readings from Last 3 Encounters:  12/26/15 140 lb (63.5 kg)  11/15/15 138 lb 8 oz (62.8 kg)  11/13/15 137 lb (62.1 kg)    Physical Exam  Constitutional: She appears well-developed and well-nourished.  Eyes: Conjunctivae are normal.  Cardiovascular: Normal rate, regular rhythm, normal heart sounds and normal pulses.   Pulmonary/Chest: Effort normal and breath  sounds normal. She has no wheezes. She has no rhonchi. She has no rales.  Abdominal: There is no CVA tenderness.  Genitourinary: There is no rash, tenderness or lesion on the right labia. There is no rash, tenderness or lesion on the left labia. Cervix exhibits no motion tenderness and no discharge. Right adnexum displays no mass, no tenderness and no fullness. Left adnexum displays no mass, no tenderness and no fullness. No erythema, tenderness or bleeding in the vagina. No foreign body in the vagina. No vaginal discharge found.  Genitourinary Comments: Diffuse vulvovaginal erythema. Vaginal atrophy and dryness. No lesions. Discharge is thin and clear, not purulent.   Neurological: She is alert.  Skin: Skin is warm and dry.  Psychiatric: She has a normal mood and affect. Her speech is normal and behavior is normal. Thought content normal.  Vitals reviewed.      Assessment & Plan:   1. Vaginal itching Suspect vulvovaginal candidiasis due to recent antibiotic use. Alternatively, considering postmenopausal vaginal atrophy due to dryness, irritation. Advised her if symptoms are not improved with second dose antifungal, to follow-up PCP to discuss possible topical estradiol preparations.   - fluconazole (DIFLUCAN) 150 MG tablet; Take 1 tablet (150 mg total) by mouth once. Take one tablet PO once. If continue to have symptoms, may take one tablet PO 3 days later.  Dispense: 2 tablet; Refill: 1 - WET PREP BY MOLECULAR PROBE - POCT urinalysis dipstick  2. Hematuria Urinalysis is positive for blood, leukocytes. Will treat empirically for UTI. Pending culture.   I am having Ms. Teresa Lin start on fluconazole. I am also having her maintain her folic acid, hydroxychloroquine, aspirin, calcium-vitamin D, multivitamin with minerals, methotrexate, alendronate, nitroGLYCERIN, nitrofurantoin (macrocrystal-monohydrate), ondansetron, doxycycline, predniSONE, and pantoprazole.   Meds ordered this encounter    Medications  . fluconazole (DIFLUCAN) 150 MG tablet    Sig: Take 1 tablet (150 mg total) by mouth once. Take one tablet PO once. If continue to have symptoms, may take one tablet PO 3 days later.    Dispense:  2 tablet    Refill:  1    Order Specific Question:   Supervising Provider    Answer:   Crecencio Mc [[id number]]    Return precautions given.   Risks, benefits, and alternatives of the medications and treatment plan prescribed today were discussed, and patient expressed understanding.   Education regarding symptom management and diagnosis given to patient on AVS.  Continue to follow with TULLO, Aris Everts, MD for routine health maintenance.   Lowella Grip and I agreed with plan.   Mable Paris, FNP

## 2015-12-26 NOTE — Telephone Encounter (Signed)
Patinet would like for Dr. Derrel Nip to speak with her directly at her earliest convince at home.  She wants to know about the medications Mable Paris FNP prescribed to her.

## 2015-12-26 NOTE — Addendum Note (Signed)
Addended by: Leeanne Rio on: 12/26/2015 10:01 AM   Modules accepted: Orders

## 2015-12-26 NOTE — Progress Notes (Signed)
Pre visit review using our clinic review tool, if applicable. No additional management support is needed unless otherwise documented below in the visit note. 

## 2015-12-26 NOTE — Patient Instructions (Signed)
Suspect yeast infection. If irritation does not improve after another dose of antifungal medication, follow-up with Dr. Derrel Nip regarding vaginal atrophy and possible benefit from topical estrogen.    Monilial Vaginitis Vaginitis in a soreness, swelling and redness (inflammation) of the vagina and vulva. Monilial vaginitis is not a sexually transmitted infection. CAUSES  Yeast vaginitis is caused by yeast (candida) that is normally found in your vagina. With a yeast infection, the candida has overgrown in number to a point that upsets the chemical balance. SYMPTOMS   White, thick vaginal discharge.  Swelling, itching, redness and irritation of the vagina and possibly the lips of the vagina (vulva).  Burning or painful urination.  Painful intercourse. DIAGNOSIS  Things that may contribute to monilial vaginitis are:  Postmenopausal and virginal states.  Pregnancy.  Infections.  Being tired, sick or stressed, especially if you had monilial vaginitis in the past.  Diabetes. Good control will help lower the chance.  Birth control pills.  Tight fitting garments.  Using bubble bath, feminine sprays, douches or deodorant tampons.  Taking certain medications that kill germs (antibiotics).  Sporadic recurrence can occur if you become ill. TREATMENT  Your caregiver will give you medication.  There are several kinds of anti monilial vaginal creams and suppositories specific for monilial vaginitis. For recurrent yeast infections, use a suppository or cream in the vagina 2 times a week, or as directed.  Anti-monilial or steroid cream for the itching or irritation of the vulva may also be used. Get your caregiver's permission.  Painting the vagina with methylene blue solution may help if the monilial cream does not work.  Eating yogurt may help prevent monilial vaginitis. HOME CARE INSTRUCTIONS   Finish all medication as prescribed.  Do not have sex until treatment is completed or  after your caregiver tells you it is okay.  Take warm sitz baths.  Do not douche.  Do not use tampons, especially scented ones.  Wear cotton underwear.  Avoid tight pants and panty hose.  Tell your sexual partner that you have a yeast infection. They should go to their caregiver if they have symptoms such as mild rash or itching.  Your sexual partner should be treated as well if your infection is difficult to eliminate.  Practice safer sex. Use condoms.  Some vaginal medications cause latex condoms to fail. Vaginal medications that harm condoms are:  Cleocin cream.  Butoconazole (Femstat).  Terconazole (Terazol) vaginal suppository.  Miconazole (Monistat) (may be purchased over the counter). SEEK MEDICAL CARE IF:   You have a temperature by mouth above 102 F (38.9 C).  The infection is getting worse after 2 days of treatment.  The infection is not getting better after 3 days of treatment.  You develop blisters in or around your vagina.  You develop vaginal bleeding, and it is not your menstrual period.  You have pain when you urinate.  You develop intestinal problems.  You have pain with sexual intercourse.   This information is not intended to replace advice given to you by your health care provider. Make sure you discuss any questions you have with your health care provider.   Document Released: 02/06/2005 Document Revised: 07/22/2011 Document Reviewed: 10/31/2014 Elsevier Interactive Patient Education [employer].

## 2015-12-27 ENCOUNTER — Telehealth: Payer: Self-pay | Admitting: Family

## 2015-12-27 DIAGNOSIS — R319 Hematuria, unspecified: Secondary | ICD-10-CM

## 2015-12-27 LAB — WET PREP BY MOLECULAR PROBE
Candida species: NEGATIVE
Gardnerella vaginalis: NEGATIVE
Trichomonas vaginosis: NEGATIVE

## 2015-12-27 NOTE — Telephone Encounter (Signed)
Teresa Lin.  I spoke with patient so no need to call her with results.  Spoke with patient at length about negative results of wet prep. I also spoke with her about pending urine culture and that we would call her with those results. Patient was grateful for call and all questions answered.  She also notes that she spoke with her PCP yesterday as well.

## 2015-12-28 ENCOUNTER — Telehealth: Payer: Self-pay | Admitting: *Deleted

## 2015-12-28 LAB — URINE CULTURE: Organism ID, Bacteria: NO GROWTH

## 2015-12-28 NOTE — Telephone Encounter (Signed)
Patient requested a call to discuss labs from 08/15 and medications.  Pt contact [phone]

## 2015-12-28 NOTE — Telephone Encounter (Signed)
Called patient but was unable to leave voicemail due to being unable to. Continually ringing.

## 2015-12-29 ENCOUNTER — Telehealth: Payer: Self-pay | Admitting: Internal Medicine

## 2015-12-29 NOTE — Telephone Encounter (Signed)
Pt called about wanting to know her lab results from 12/26/15  and wanting to know if she can take the third pill of fluconazole (DIFLUCAN) 150 MG tablet. Pt is still having a little bit of symptoms.   Call pt @ [phone] or [phone] cell Thank you!

## 2015-12-29 NOTE — Telephone Encounter (Signed)
Left message for patient to return call back.  

## 2015-12-29 NOTE — Telephone Encounter (Signed)
Hey there,  I already sent you a message about lab results which you can discuss with her.   Wet prep didn't show yeast so I would advise not to take another round of diflucan.   Advise her to make a follow up appt if she continues to have symptoms.

## 2015-12-29 NOTE — Telephone Encounter (Signed)
Please advise 

## 2016-01-01 NOTE — Telephone Encounter (Signed)
It looks like you have spoken to patient about this.

## 2016-01-01 NOTE — Telephone Encounter (Signed)
LVMRCB

## 2016-01-01 NOTE — Telephone Encounter (Signed)
Ms. Callaway called back asking for lab results. She said she went to [name] two weeks ago and had a CT scan. She was unable to have the Cystoscopy due to having a UTI. She's wondering if she still has it due to the history of blood in her urine. She advised she's taken the three pills that were prescribed for her.  Pt's ph# [phone] Thank you.

## 2016-01-02 ENCOUNTER — Telehealth: Payer: Self-pay | Admitting: *Deleted

## 2016-01-02 NOTE — Telephone Encounter (Signed)
Patient has requested to make sure that the [facility] that was prescribed the correct dosage for her to take, She stated that she previously has a antibiotic with a much  Lower dosage  Pt contact [phone]

## 2016-01-02 NOTE — Telephone Encounter (Signed)
Please advise 

## 2016-01-02 NOTE — Telephone Encounter (Signed)
Results have been mailed off due to patient not answering phone.  Patient was informed about results and medication.  Patient understood but would like to receive a phone call from Burnard Hawthorne FNP.

## 2016-01-02 NOTE — Telephone Encounter (Signed)
Left message for patient to return call back.  

## 2016-01-03 ENCOUNTER — Telehealth: Payer: Self-pay | Admitting: Family

## 2016-01-03 NOTE — Telephone Encounter (Signed)
FYI Dr. Bishop Dublin with respect at great length about her vaginal itching and mutation. Her urine culture came back negative so do not think it's urinary tract infection and I advised her to go ahead and stop the Ceftinir. She had only taken one dose. She's also recently been treated multiple times for suspected yeast infection based on her symptoms so low suspicion that she continues to have yeast infection. Wet prep that I did was negative for bacterial vaginosis as well as yeast. Perhaps vaginal atrophy which I discussed with patient.   She is obviously frustrated and is going to see urology at [facility] on September 8. She has follow-up with her PCP September 28. She's currently awaiting cystoscopy for blood in her urine which I also had seen. She was to come back to our office and recheck the blood in her urine however I advised to cancel that as she is going to be seeing urology for this.

## 2016-01-03 NOTE — Telephone Encounter (Signed)
Spoke with patient.

## 2016-01-05 DIAGNOSIS — R42 Dizziness and giddiness: Secondary | ICD-10-CM | POA: Diagnosis not present

## 2016-01-05 DIAGNOSIS — I679 Cerebrovascular disease, unspecified: Secondary | ICD-10-CM | POA: Diagnosis not present

## 2016-01-09 ENCOUNTER — Other Ambulatory Visit: Payer: Self-pay | Admitting: Neurology

## 2016-01-09 DIAGNOSIS — I679 Cerebrovascular disease, unspecified: Secondary | ICD-10-CM

## 2016-01-09 DIAGNOSIS — R42 Dizziness and giddiness: Secondary | ICD-10-CM

## 2016-01-18 DIAGNOSIS — R3129 Other microscopic hematuria: Secondary | ICD-10-CM | POA: Diagnosis not present

## 2016-01-24 ENCOUNTER — Ambulatory Visit
Admission: RE | Admit: 2016-01-24 | Discharge: 2016-01-24 | Disposition: A | Discharge status: Home / Self Care | Payer: Medicare Other | Source: Ambulatory Visit | Attending: Neurology | Admitting: Neurology

## 2016-01-24 ENCOUNTER — Telehealth: Payer: Self-pay | Admitting: Internal Medicine

## 2016-01-24 DIAGNOSIS — I679 Cerebrovascular disease, unspecified: Secondary | ICD-10-CM | POA: Diagnosis not present

## 2016-01-24 DIAGNOSIS — R42 Dizziness and giddiness: Secondary | ICD-10-CM

## 2016-01-24 MED ORDER — GADOBENATE DIMEGLUMINE 529 MG/ML IV SOLN
15.0000 mL | Freq: Once | INTRAVENOUS | Status: AC | PRN
  Administered 2016-01-24: 12 mL via INTRAVENOUS

## 2016-01-24 NOTE — Telephone Encounter (Signed)
Test was canceled as she was going to see a provider at [place] per Arnett's note. thanks

## 2016-01-24 NOTE — Telephone Encounter (Signed)
Pt wanted to know if she needed to come back in and give a urine specimen.Teresa Lin Please advise

## 2016-02-06 ENCOUNTER — Encounter (INDEPENDENT_AMBULATORY_CARE_PROVIDER_SITE_OTHER): Payer: Self-pay

## 2016-02-06 ENCOUNTER — Ambulatory Visit: Payer: Medicare Other | Admitting: Internal Medicine

## 2016-02-06 ENCOUNTER — Ambulatory Visit (INDEPENDENT_AMBULATORY_CARE_PROVIDER_SITE_OTHER): Payer: Medicare Other | Admitting: Internal Medicine

## 2016-02-06 VITALS — BP 122/62 | HR 80 | Temp 97.9°F | Resp 12 | Ht 65.0 in | Wt 141.0 lb

## 2016-02-06 DIAGNOSIS — R300 Dysuria: Secondary | ICD-10-CM

## 2016-02-06 DIAGNOSIS — I679 Cerebrovascular disease, unspecified: Secondary | ICD-10-CM

## 2016-02-06 DIAGNOSIS — N760 Acute vaginitis: Secondary | ICD-10-CM

## 2016-02-06 DIAGNOSIS — R42 Dizziness and giddiness: Secondary | ICD-10-CM | POA: Diagnosis not present

## 2016-02-06 LAB — POCT URINALYSIS DIPSTICK
Bilirubin, UA: NEGATIVE
Glucose, UA: NEGATIVE
Ketones, UA: NEGATIVE
Leukocytes, UA: NEGATIVE
Nitrite, UA: NEGATIVE
Protein, UA: NEGATIVE
Spec Grav, UA: 1.01
Urobilinogen, UA: 0.2
pH, UA: 6

## 2016-02-06 MED ORDER — MICONAZOLE NITRATE 2 % VA CREA: 1.0000 | TOPICAL_CREAM | Freq: Every day | VAGINAL | 1 refills | Status: DC

## 2016-02-06 MED ORDER — ESTRADIOL 0.1 MG/GM VA CREA: 1.0000 | TOPICAL_CREAM | Freq: Every day | VAGINAL | 12 refills | Status: DC

## 2016-02-06 NOTE — Progress Notes (Signed)
Subjective:  Patient ID: Teresa Lin, female    DOB: 05/29/1931  Age: 82 y.o. MRN: [id number]  CC: The primary encounter diagnosis was Dysuria. 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  HPI Teresa Lin presents for follow up on recent evaluations by Neurology and Urology .  Neurology:  .referral  In August for recurrent dizziness and syncope. severe changes noted on head CT .  MRI of brain was done  Showing  moderate atrophy and advanced white matter changes.  The significance of these finding was discussed with patient today    Urology: referral was done dure to persistent urethral pain leading to multiple treatments for UTI .   CT was normal  August 8 Treated for yeast infection and cystoscopy postponed but also normal when done on Sept 7.  .  Was given miconazole for yeast infection but has not been treating adequately (not inserting the applicator, only applying to outside of vaginal/urethra.  Symptoms have persisted.  Discussed alternative diagnosis of atrophic vaginitis  As the cause of recurrent symptoms  Has resumed methotrexate per Precious Reel.   Will talk to him about resuming alendronate   Neck problems improving .  Exercising regularly,       Outpatient Medications Prior to Visit  Medication Sig Dispense Refill  . alendronate (FOSAMAX) 70 MG tablet Take by mouth. Reported on 11/13/2015    . aspirin 81 MG tablet Take 81 mg by mouth daily.    . calcium-vitamin D (OSCAL WITH D) 500-200 MG-UNIT per tablet Take 1 tablet by mouth. Reported on 11/13/2015    . fluconazole (DIFLUCAN) 150 MG tablet Take 1 tablet (150 mg total) by mouth daily. 2 tablet 1  . folic acid (FOLVITE) 1 MG tablet Take 1 mg by mouth daily.     . hydroxychloroquine (PLAQUENIL) 200 MG tablet Take 200 mg by mouth daily.    . methotrexate 50 MG/2ML injection Inject into the skin.    . Multiple Vitamins-Minerals (MULTIVITAMIN WITH  MINERALS) tablet Take 1 tablet by mouth daily.    . nitroGLYCERIN (NITROSTAT) 0.4 MG SL tablet Place 1 tablet (0.4 mg total) under the tongue every 5 (five) minutes as needed for chest pain. 50 tablet 3  . ondansetron (ZOFRAN) 4 MG tablet Take 1 tablet (4 mg total) by mouth daily as needed for nausea or vomiting. 20 tablet 1  . pantoprazole (PROTONIX) 40 MG tablet Take 1 tablet (40 mg total) by mouth daily. 30 tablet 3  . predniSONE (DELTASONE) 10 MG tablet 6 tablets on Day 1 , then reduce by 1 tablet daily until gone 21 tablet 0   No facility-administered medications prior to visit.     Review of Systems;  Patient denies headache, fevers, malaise, unintentional weight loss, skin rash, eye pain, sinus congestion and sinus pain, sore throat, dysphagia,  hemoptysis , cough, dyspnea, wheezing, chest pain, palpitations, orthopnea, edema, abdominal pain, nausea, melena, diarrhea, constipation, flank pain, dysuria, hematuria, urinary  Frequency, nocturia, numbness, tingling, seizures,  Focal weakness, Loss of consciousness,  Tremor, insomnia, depression, anxiety, and suicidal ideation.      Objective:  BP 122/62   Pulse 80   Temp 97.9 F (36.6 C) (Oral)   Resp 12   Ht 5\' 5"  (1.651 m)   Wt 141 lb (64 kg)   SpO2 97%   BMI 23.46 kg/m   BP Readings from Last 3 Encounters:  02/06/16 122/62  12/26/15  128/62  11/15/15 132/64    Wt Readings from Last 3 Encounters:  02/06/16 141 lb (64 kg)  12/26/15 140 lb (63.5 kg)  11/15/15 138 lb 8 oz (62.8 kg)    General appearance: alert, cooperative and appears stated age Ears: normal TM's and external ear canals both ears Throat: lips, mucosa, and tongue normal; teeth and gums normal Neck: no adenopathy, no carotid bruit, supple, symmetrical, trachea midline and thyroid not enlarged, symmetric, no tenderness/mass/nodules Back: symmetric, no curvature. ROM normal. No CVA tenderness. Lungs: clear to auscultation bilaterally Heart: regular rate and  rhythm, S1, S2 normal, no murmur, click, rub or gallop Abdomen: soft, non-tender; bowel sounds normal; no masses,  no organomegaly Pulses: 2+ and symmetric Skin: Skin color, texture, turgor normal. No rashes or lesions Lymph nodes: Cervical, supraclavicular, and axillary nodes normal.  No results found for: HGBA1C  Lab Results  Component Value Date   CREATININE 0.61 11/10/2015   CREATININE 0.51 10/13/2015   CREATININE 0.58 08/28/2015    Lab Results  Component Value Date   WBC 5.7 11/10/2015   HGB 13.3 11/10/2015   HCT 39.5 11/10/2015   PLT 217 11/10/2015   GLUCOSE 118 (H) 11/10/2015   CHOL 181 08/28/2015   TRIG 83.0 08/28/2015   HDL 62.90 08/28/2015   LDLCALC 102 (H) 08/28/2015   ALT 17 10/13/2015   AST 27 10/13/2015   NA 135 11/10/2015   K 4.1 11/10/2015   CL 99 (L) 11/10/2015   CREATININE 0.61 11/10/2015   BUN 13 11/10/2015   CO2 27 11/10/2015   TSH 2.04 08/28/2015   INR 0.9 08/08/2011    Mr Brain W Wo Contrast  Result Date: 01/24/2016 CLINICAL DATA:  Dizziness. Cerebral vascular small vessel disease. Two falls since January. Family history of MS. EXAM: MRI HEAD WITHOUT AND WITH CONTRAST TECHNIQUE: Multiplanar, multiecho pulse sequences of the brain and surrounding structures were obtained without and with intravenous contrast. CONTRAST:  28mL MULTIHANCE GADOBENATE DIMEGLUMINE 529 MG/ML IV SOLN COMPARISON:  CT head without contrast 11/10/2015. FINDINGS: The diffusion-weighted images demonstrate no evidence for acute or subacute infarction. Moderate atrophy and diffuse periventricular and subcortical white matter changes are present bilaterally. There are remote lacunar infarcts within the basal ganglia bilaterally. The internal auditory canals are within normal limits bilaterally. The brainstem and cerebellum are normal. Flow is present in the major intracranial arteries. Bilateral lens replacements are present. The globes orbits are otherwise intact. The paranasal sinuses  and the mastoid air cells are clear. The postcontrast images demonstrate no pathologic enhancement. IMPRESSION: 1. No acute intracranial abnormality. 2. Moderate atrophy and advanced periventricular and subcortical white matter disease bilaterally. This is nonspecific, but most likely reflects the sequela of chronic microvascular ischemia within this age demographic. Electronically Signed   By: San Morelle M.D.   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  Behavior modification advised to prevent falls       Vaginitis and vulvovaginitis    Advised to repeat full treatment with miconazole.  rx refilled. Also advised ot start vaginala estrogen after finishing the miconazole.         Other Visit Diagnoses    Dysuria    -  Primary   Relevant Orders   POCT Urinalysis Dipstick (Completed)      I am  having Ms. Shams start on miconazole and estradiol. I am also having her maintain her folic acid, hydroxychloroquine, aspirin, calcium-vitamin D, multivitamin with minerals, methotrexate, alendronate, nitroGLYCERIN, ondansetron, predniSONE, pantoprazole, and fluconazole.  Meds ordered this encounter  Medications  . miconazole (MICONAZOLE 7) 2 % vaginal cream    Sig: Place 1 Applicatorful vaginally at bedtime.    Dispense:  45 g    Refill:  1  . estradiol (ESTRACE VAGINAL) 0.1 MG/GM vaginal cream    Sig: Place 1 Applicatorful vaginally at bedtime. Daily for 2 weeks,  Then twice weekly thereafter    Dispense:  42.5 g    Refill:  12    There are no discontinued medications.  Follow-up: No Follow-up on file.   Crecencio Mc, MD

## 2016-02-06 NOTE — Patient Instructions (Signed)
I would repeat the miconazole treatment using the FULL application (inside you) for one week  After you finish,  Start using the Estrace cream EVERY NIGHT  Using the applicator,  For 2 weeks  Then decrease use of Estrace to twice a week

## 2016-02-06 NOTE — Progress Notes (Signed)
Pre-visit discussion using our clinic review tool. No additional management support is needed unless otherwise documented below in the visit note.  

## 2016-02-08 ENCOUNTER — Encounter: Payer: Self-pay | Admitting: Internal Medicine

## 2016-02-08 DIAGNOSIS — Z79899 Other long term (current) drug therapy: Secondary | ICD-10-CM | POA: Diagnosis not present

## 2016-02-08 DIAGNOSIS — E7800 Pure hypercholesterolemia, unspecified: Secondary | ICD-10-CM | POA: Diagnosis not present

## 2016-02-08 DIAGNOSIS — M0609 Rheumatoid arthritis without rheumatoid factor, multiple sites: Secondary | ICD-10-CM | POA: Diagnosis not present

## 2016-02-08 DIAGNOSIS — N760 Acute vaginitis: Secondary | ICD-10-CM | POA: Insufficient documentation

## 2016-02-08 DIAGNOSIS — I10 Essential (primary) hypertension: Secondary | ICD-10-CM | POA: Diagnosis not present

## 2016-02-08 DIAGNOSIS — R079 Chest pain, unspecified: Secondary | ICD-10-CM | POA: Diagnosis not present

## 2016-02-08 NOTE — Assessment & Plan Note (Signed)
She is not interested in statin therapy, as her cholesterol profile is excellent .  Behavior modification advised to prevent falls

## 2016-02-08 NOTE — Assessment & Plan Note (Signed)
Advised to repeat full treatment with miconazole.  rx refilled. Also advised ot start vaginala estrogen after finishing the miconazole.

## 2016-02-09 ENCOUNTER — Telehealth: Payer: Self-pay | Admitting: Internal Medicine

## 2016-02-09 NOTE — Telephone Encounter (Signed)
Pt called back returning your call regarding results. Thank you! °

## 2016-02-09 NOTE — Telephone Encounter (Signed)
Patient notified of lab results

## 2016-02-09 NOTE — Telephone Encounter (Signed)
Left message to return call to office.

## 2016-02-09 NOTE — Telephone Encounter (Signed)
Pt called returning your call regarding results.  Thank you!  Call pt @ [phone]

## 2016-03-13 DIAGNOSIS — M8000XS Age-related osteoporosis with current pathological fracture, unspecified site, sequela: Secondary | ICD-10-CM | POA: Diagnosis not present

## 2016-03-13 DIAGNOSIS — G8929 Other chronic pain: Secondary | ICD-10-CM | POA: Diagnosis not present

## 2016-03-13 DIAGNOSIS — Z79899 Other long term (current) drug therapy: Secondary | ICD-10-CM | POA: Diagnosis not present

## 2016-03-13 DIAGNOSIS — M79671 Pain in right foot: Secondary | ICD-10-CM | POA: Diagnosis not present

## 2016-03-13 DIAGNOSIS — M0609 Rheumatoid arthritis without rheumatoid factor, multiple sites: Secondary | ICD-10-CM | POA: Diagnosis not present

## 2016-03-22 DIAGNOSIS — M545 Low back pain: Secondary | ICD-10-CM | POA: Diagnosis not present

## 2016-03-22 DIAGNOSIS — S22000A Wedge compression fracture of unspecified thoracic vertebra, initial encounter for closed fracture: Secondary | ICD-10-CM | POA: Diagnosis not present

## 2016-03-26 DIAGNOSIS — S22000D Wedge compression fracture of unspecified thoracic vertebra, subsequent encounter for fracture with routine healing: Secondary | ICD-10-CM | POA: Diagnosis not present

## 2016-04-01 DIAGNOSIS — S22000D Wedge compression fracture of unspecified thoracic vertebra, subsequent encounter for fracture with routine healing: Secondary | ICD-10-CM | POA: Diagnosis not present

## 2016-04-05 DIAGNOSIS — S22000D Wedge compression fracture of unspecified thoracic vertebra, subsequent encounter for fracture with routine healing: Secondary | ICD-10-CM | POA: Diagnosis not present

## 2016-04-08 ENCOUNTER — Ambulatory Visit (INDEPENDENT_AMBULATORY_CARE_PROVIDER_SITE_OTHER): Payer: Medicare Other

## 2016-04-08 DIAGNOSIS — Z23 Encounter for immunization: Secondary | ICD-10-CM

## 2016-04-09 DIAGNOSIS — S22000D Wedge compression fracture of unspecified thoracic vertebra, subsequent encounter for fracture with routine healing: Secondary | ICD-10-CM | POA: Diagnosis not present

## 2016-04-12 DIAGNOSIS — S22000D Wedge compression fracture of unspecified thoracic vertebra, subsequent encounter for fracture with routine healing: Secondary | ICD-10-CM | POA: Diagnosis not present

## 2016-04-12 DIAGNOSIS — L814 Other melanin hyperpigmentation: Secondary | ICD-10-CM | POA: Diagnosis not present

## 2016-04-12 DIAGNOSIS — Z85828 Personal history of other malignant neoplasm of skin: Secondary | ICD-10-CM | POA: Diagnosis not present

## 2016-04-12 DIAGNOSIS — R233 Spontaneous ecchymoses: Secondary | ICD-10-CM | POA: Diagnosis not present

## 2016-04-12 DIAGNOSIS — L821 Other seborrheic keratosis: Secondary | ICD-10-CM | POA: Diagnosis not present

## 2016-04-15 DIAGNOSIS — S22000D Wedge compression fracture of unspecified thoracic vertebra, subsequent encounter for fracture with routine healing: Secondary | ICD-10-CM | POA: Diagnosis not present

## 2016-04-19 DIAGNOSIS — S22000D Wedge compression fracture of unspecified thoracic vertebra, subsequent encounter for fracture with routine healing: Secondary | ICD-10-CM | POA: Diagnosis not present

## 2016-04-22 DIAGNOSIS — S22000D Wedge compression fracture of unspecified thoracic vertebra, subsequent encounter for fracture with routine healing: Secondary | ICD-10-CM | POA: Diagnosis not present

## 2016-04-26 DIAGNOSIS — S22000D Wedge compression fracture of unspecified thoracic vertebra, subsequent encounter for fracture with routine healing: Secondary | ICD-10-CM | POA: Diagnosis not present

## 2016-04-29 DIAGNOSIS — S22000D Wedge compression fracture of unspecified thoracic vertebra, subsequent encounter for fracture with routine healing: Secondary | ICD-10-CM | POA: Diagnosis not present

## 2016-05-01 DIAGNOSIS — S22000D Wedge compression fracture of unspecified thoracic vertebra, subsequent encounter for fracture with routine healing: Secondary | ICD-10-CM | POA: Diagnosis not present

## 2016-06-05 ENCOUNTER — Ambulatory Visit (INDEPENDENT_AMBULATORY_CARE_PROVIDER_SITE_OTHER): Payer: Medicare Other | Admitting: Internal Medicine

## 2016-06-05 ENCOUNTER — Telehealth: Payer: Self-pay | Admitting: *Deleted

## 2016-06-05 ENCOUNTER — Telehealth: Payer: Self-pay | Admitting: Internal Medicine

## 2016-06-05 VITALS — BP 136/68 | HR 105 | Temp 97.9°F | Resp 16 | Ht 65.0 in | Wt 140.0 lb

## 2016-06-05 DIAGNOSIS — R350 Frequency of micturition: Secondary | ICD-10-CM | POA: Diagnosis not present

## 2016-06-05 DIAGNOSIS — R42 Dizziness and giddiness: Secondary | ICD-10-CM

## 2016-06-05 DIAGNOSIS — I951 Orthostatic hypotension: Secondary | ICD-10-CM

## 2016-06-05 DIAGNOSIS — I679 Cerebrovascular disease, unspecified: Secondary | ICD-10-CM

## 2016-06-05 LAB — URINALYSIS, ROUTINE W REFLEX MICROSCOPIC
Bilirubin Urine: NEGATIVE
Ketones, ur: NEGATIVE
Nitrite: NEGATIVE
Specific Gravity, Urine: 1.015 (ref 1.000–1.030)
Total Protein, Urine: NEGATIVE
Urine Glucose: NEGATIVE
Urobilinogen, UA: 0.2 (ref 0.0–1.0)
pH: 6 (ref 5.0–8.0)

## 2016-06-05 LAB — POCT URINALYSIS DIPSTICK
Bilirubin, UA: NEGATIVE
Glucose, UA: NEGATIVE
Ketones, UA: NEGATIVE
Nitrite, UA: NEGATIVE
Protein, UA: NEGATIVE
Spec Grav, UA: 1.015
Urobilinogen, UA: 0.2
pH, UA: 6

## 2016-06-05 NOTE — Progress Notes (Signed)
Pre-visit discussion using our clinic review tool. No additional management support is needed unless otherwise documented below in the visit note.  

## 2016-06-05 NOTE — Progress Notes (Signed)
Subjective:  Patient ID: Teresa Lin, female    DOB: Jul 16, 1931  Age: 83 y.o. MRN: [id number]  CC: The primary encounter diagnosis was Frequency of urination. Diagnoses of Orthostasis and Vertigo due to cerebrovascular disease were also pertinent to this visit.  HPI Teresa Lin presents for evaluation of recurrent episodes of dizziness and blurred vision.   Last episode OCURRED YESTERDAY,  TOOK [place] and spent a few hours in a dark room lying down .  Symptoms resolved.   STILL FEELS DIZZY WITH SUDDEN POSITION CHANGES.  DOES NOT TAKE BP MEDICATIONS.   PRIOR EVAL BY NEUROLOGY FOR SAME IN 2017 INCLUDED MRI BRAIN WHICH SHOWED   MICROVASCULAR DISEASE  AND ATROPHY, age  83.  Lab Results  Component Value Date   CHOL 181 08/28/2015   HDL 62.90 08/28/2015   LDLCALC 102 (H) 08/28/2015   TRIG 83.0 08/28/2015   CHOLHDL 3 08/28/2015     Outpatient Medications Prior to Visit  Medication Sig Dispense Refill  . alendronate (FOSAMAX) 70 MG tablet Take by mouth. Reported on 11/13/2015    . aspirin 81 MG tablet Take 81 mg by mouth daily.    . calcium-vitamin D (OSCAL WITH D) 500-200 MG-UNIT per tablet Take 1 tablet by mouth. Reported on 0000000    . folic acid (FOLVITE) 1 MG tablet Take 1 mg by mouth daily.     . hydroxychloroquine (PLAQUENIL) 200 MG tablet Take 200 mg by mouth daily.    . methotrexate 50 MG/2ML injection Inject into the skin.    . Multiple Vitamins-Minerals (MULTIVITAMIN WITH MINERALS) tablet Take 1 tablet by mouth daily.    . nitroGLYCERIN (NITROSTAT) 0.4 MG SL tablet Place 1 tablet (0.4 mg total) under the tongue every 5 (five) minutes as needed for chest pain. (Patient not taking: Reported on 06/05/2016) 50 tablet 3  . estradiol (ESTRACE VAGINAL) 0.1 MG/GM vaginal cream Place 1 Applicatorful vaginally at bedtime. Daily for 2 weeks,  Then twice weekly thereafter 42.5 g 12  . fluconazole (DIFLUCAN) 150 MG tablet Take 1 tablet (150 mg total) by mouth daily. 2 tablet 1  .  miconazole (MICONAZOLE 7) 2 % vaginal cream Place 1 Applicatorful vaginally at bedtime. 45 g 1  . ondansetron (ZOFRAN) 4 MG tablet Take 1 tablet (4 mg total) by mouth daily as needed for nausea or vomiting. 20 tablet 1  . pantoprazole (PROTONIX) 40 MG tablet Take 1 tablet (40 mg total) by mouth daily. 30 tablet 3  . predniSONE (DELTASONE) 10 MG tablet 6 tablets on Day 1 , then reduce by 1 tablet daily until gone 21 tablet 0   No facility-administered medications prior to visit.     Review of Systems;  Patient denies headache, fevers, malaise, unintentional weight loss, skin rash, eye pain, sinus congestion and sinus pain, sore throat, dysphagia,  hemoptysis , cough, dyspnea, wheezing, chest pain, palpitations, orthopnea, edema, abdominal pain, nausea, melena, diarrhea, constipation, flank pain, dysuria, hematuria, urinary  Frequency, nocturia, numbness, tingling, seizures,  Focal weakness, Loss of consciousness,  Tremor, insomnia, depression, anxiety, and suicidal ideation.      Objective:  BP 136/68   Pulse (!) 105   Temp 97.9 F (36.6 C) (Oral)   Resp 16   Ht 5\' 5"  (1.651 m)   Wt 140 lb (63.5 kg)   SpO2 97%   BMI 23.30 kg/m   BP Readings from Last 3 Encounters:  06/05/16 136/68  02/06/16 122/62  12/26/15 128/62    Wt Readings from  Last 3 Encounters:  06/05/16 140 lb (63.5 kg)  02/06/16 141 lb (64 kg)  12/26/15 140 lb (63.5 kg)    General appearance: alert, cooperative and appears stated age Ears: normal TM's and external ear canals both ears Throat: lips, mucosa, and tongue normal; teeth and gums normal Neck: no adenopathy, no carotid bruit, supple, symmetrical, trachea midline and thyroid not enlarged, symmetric, no tenderness/mass/nodules Back: symmetric, no curvature. ROM normal. No CVA tenderness. Lungs: clear to auscultation bilaterally Heart: regular rate and rhythm, S1, S2 normal, no murmur, click, rub or gallop Abdomen: soft, non-tender; bowel sounds normal; no  masses,  no organomegaly Pulses: 2+ and symmetric Neuro: CNs 2-12 intact. DTRs 2+/4 in biceps, brachioradialis, patellars and achilles. Muscle strength 5/5 in upper and lower exremities. Fine resting tremor bilaterally both hands cerebellar function normal. Romberg negative.  No pronator drift.   Gait normal. .  No results found for: HGBA1C  Lab Results  Component Value Date   CREATININE 0.61 11/10/2015   CREATININE 0.51 10/13/2015   CREATININE 0.58 08/28/2015    Lab Results  Component Value Date   WBC 5.7 11/10/2015   HGB 13.3 11/10/2015   HCT 39.5 11/10/2015   PLT 217 11/10/2015   GLUCOSE 118 (H) 11/10/2015   CHOL 181 08/28/2015   TRIG 83.0 08/28/2015   HDL 62.90 08/28/2015   LDLCALC 102 (H) 08/28/2015   ALT 17 10/13/2015   AST 27 10/13/2015   NA 135 11/10/2015   K 4.1 11/10/2015   CL 99 (L) 11/10/2015   CREATININE 0.61 11/10/2015   BUN 13 11/10/2015   CO2 27 11/10/2015   TSH 2.04 08/28/2015   INR 0.9 08/08/2011    Mr Brain W Wo Contrast  Result Date: 01/24/2016 CLINICAL DATA:  Dizziness. Cerebral vascular small vessel disease. Two falls since January. Family history of MS. EXAM: MRI HEAD WITHOUT AND WITH CONTRAST TECHNIQUE: Multiplanar, multiecho pulse sequences of the brain and surrounding structures were obtained without and with intravenous contrast. CONTRAST:  74mL MULTIHANCE GADOBENATE DIMEGLUMINE 529 MG/ML IV SOLN COMPARISON:  CT head without contrast 11/10/2015. FINDINGS: The diffusion-weighted images demonstrate no evidence for acute or subacute infarction. Moderate atrophy and diffuse periventricular and subcortical white matter changes are present bilaterally. There are remote lacunar infarcts within the basal ganglia bilaterally. The internal auditory canals are within normal limits bilaterally. The brainstem and cerebellum are normal. Flow is present in the major intracranial arteries. Bilateral lens replacements are present. The globes orbits are otherwise  intact. The paranasal sinuses and the mastoid air cells are clear. The postcontrast images demonstrate no pathologic enhancement. IMPRESSION: 1. No acute intracranial abnormality. 2. Moderate atrophy and advanced periventricular and subcortical white matter disease bilaterally. This is nonspecific, but most likely reflects the sequela of chronic microvascular ischemia within this age demographic. Electronically Signed   By: San Morelle M.D.   On: 01/24/2016 11:04    Assessment & Plan:   Problem List Items Addressed This Visit    Orthostasis     Systolic drops to 123456.  Recommended behaviro modification,  Use of compression knee highs.  Asked to have bp checke d5 times over the next 2 weeks and will add low dose Toprol if systolic is high enough to tolerate. .       Vertigo due to cerebrovascular disease    Caused by orthostasis due to autonomic dysfunction. She is not interested in statin therapy, as her cholesterol profile is excellent .  Behavior modification advised to prevent falls .  Advised to use compression stockings.        Other Visit Diagnoses    Frequency of urination    -  Primary   Relevant Orders   POCT urinalysis dipstick (Completed)   Urinalysis, Routine w reflex microscopic (Completed)   Urine culture (Completed)     A total of 25 minutes of face to face time was spent with patient more than half of which was spent in counselling about the above mentioned conditions  and coordination of care  I have discontinued Ms. Manfredi's ondansetron, predniSONE, pantoprazole, fluconazole, miconazole, and estradiol. I am also having her maintain her folic acid, hydroxychloroquine, aspirin, calcium-vitamin D, multivitamin with minerals, methotrexate, alendronate, nitroGLYCERIN, and (Lactobacillus Rhamnosus, GG, (CULTURELLE PO)).  Meds ordered this encounter  Medications  . Lactobacillus Rhamnosus, GG, (CULTURELLE PO)    Sig: Take 1 tablet by mouth daily. Probiotic     Medications Discontinued During This Encounter  Medication Reason  . estradiol (ESTRACE VAGINAL) 0.1 MG/GM vaginal cream Patient Discharge  . fluconazole (DIFLUCAN) 150 MG tablet Patient Discharge  . miconazole (MICONAZOLE 7) 2 % vaginal cream Patient Discharge  . ondansetron (ZOFRAN) 4 MG tablet Not available  . predniSONE (DELTASONE) 10 MG tablet Completed Course  . pantoprazole (PROTONIX) 40 MG tablet Patient Discharge    Follow-up: No Follow-up on file.   Crecencio Mc, MD

## 2016-06-05 NOTE — Telephone Encounter (Signed)
Patient Name: Teresa Lin  Gender: Female  DOB: 1931/12/28   Age: 83 Y 24 M 5 D  Return Phone Number: [phone] (Primary)  Address:   City/State/Zip: [place] [address]   Client [facility]  Client Site [place]  Physician Deborra Medina - MD  Contact Type Call  Who Is Calling Patient / Member / Family / Caregiver  Call Type Triage / Clinical  Relationship To Patient Self  Return Phone Number [phone] (Primary)  Chief Complaint Dizziness  Reason for Call Symptomatic / Request for Health Information  Initial Comment dizziness since yesterday, has been able to get ouf of the bed for just a bit, feeling a bit better today, no other symptoms   Appointment Disposition EMR Patient Reports Appointment Already Scheduled  Translation No   Nurse Assessment      Guidelines      Guideline Title Affirmed Question Affirmed Notes Nurse Date/Time ([place] Time)         Disp. Time Eilene Ghazi Time) Disposition Final User                Comments  User: Ave Filter, RN Date/Time Eilene Ghazi Time): 06/05/2016 10:14:42 AM  Caller states that she already has an appt for this afternoon, she does not need to speak with a nurse.

## 2016-06-05 NOTE — Assessment & Plan Note (Signed)
Systolic drops to 123456.  Recommended behaviro modification,  Use of compression knee highs.  Asked to have bp checke d5 times over the next 2 weeks and will add low dose Toprol if systolic is high enough to tolerate. Marland Kitchen

## 2016-06-05 NOTE — Telephone Encounter (Signed)
Patient having dizziness upon standing scheduled for 1.30 today.

## 2016-06-05 NOTE — Telephone Encounter (Signed)
Pt has had dizziness since yesterday,causing her to stay in the bed.  No other symptoms.  Pt transferred to [facility]

## 2016-06-05 NOTE — Patient Instructions (Addendum)
There is a great supplier of stockings online :  [employer] for compression knee highs  That you can wear daily   Med compression  Check your BP 5 times over the next 2 weeks and send me the readings.  A very small dose of metoprolol may be worth trying ,  To stabilize your BP .    ALWAYS SIT ON THE SIDE OF THE BED FOR 12 MINUTES BEFORE GETTING UP   Orthostatic Hypotension Orthostatic hypotension is a sudden drop in blood pressure that happens when you quickly change positions, such as when you get up from a seated or lying position. Blood pressure is a measurement of how strongly, or weakly, your blood is pressing against the walls of your arteries. Arteries are blood vessels that carry blood from your heart throughout your body. When blood pressure is too low, you may not get enough blood to your brain or to the rest of your organs. This can cause weakness, light-headedness, rapid heartbeat, and fainting. This can last for just a few seconds or for up to a few minutes. Orthostatic hypotension is usually not a serious problem. However, if it happens frequently or gets worse, it may be a sign of something more serious. What are the causes? This condition may be caused by:  Sudden changes in posture, such as standing up quickly after you have been sitting or lying down.  Blood loss.  Loss of body fluids (dehydration).  Heart problems.  Hormone (endocrine) problems.  Pregnancy.  Severe infection.  Lack of certain nutrients.  Severe allergic reactions (anaphylaxis).  Certain medicines, such as blood pressure medicine or medicines that make the body lose excess fluids (diuretics). Sometimes, this condition can be caused by not taking medicine as directed, such as taking too much of a certain medicine. What increases the risk? Certain factors can make you more likely to develop orthostatic hypotension, including:  Age. Risk increases as you get older.  Conditions that affect the  heart or the central nervous system.  Taking certain medicines, such as blood pressure medicine or diuretics.  Being pregnant. What are the signs or symptoms? Symptoms of this condition may include:  Weakness.  Light-headedness.  Dizziness.  Blurred vision.  Fatigue.  Rapid heartbeat.  Fainting, in severe cases. How is this diagnosed? This condition is diagnosed based on:  Your medical history.  Your symptoms.  Your blood pressure measurement. Your health care provider will check your blood pressure when you are:  Lying down.  Sitting.  Standing. A blood pressure reading is recorded as two numbers, such as "120 over 80" (or 120/80). The first ("top") number is called the systolic pressure. It is a measure of the pressure in your arteries as your heart beats. The second ("bottom") number is called the diastolic pressure. It is a measure of the pressure in your arteries when your heart relaxes between beats. Blood pressure is measured in a unit called mm Hg. Healthy blood pressure for adults is 120/80. If your blood pressure is below 90/60, you may be diagnosed with hypotension. Other information or tests that may be used to diagnose orthostatic hypotension include:  Your other vital signs, such as your heart rate and temperature.  Blood tests.  Tilt table test. For this test, you will be safely secured to a table that moves you from a lying position to an upright position. Your heart rhythm and blood pressure will be monitored during the test. How is this treated? Treatment for this  condition may include:  Changing your diet. This may involve eating more salt (sodium) or drinking more water.  Taking medicines to raise your blood pressure.  Changing the dosage of certain medicines you are taking that might be lowering your blood pressure.  Wearing compression stockings. These stockings help to prevent blood clots and reduce swelling in your legs. In some cases, you  may need to go to the hospital for:  Fluid replacement. This means you will receive fluids through an IV tube.  Blood replacement. This means you will receive donated blood through an IV tube (transfusion).  Treating an infection or heart problems, if this applies.  Monitoring. You may need to be monitored while medicines that you are taking wear off. Follow these instructions at home: Eating and drinking  Drink enough fluid to keep your urine clear or pale yellow.  Eat a healthy diet and follow instructions from your health care provider about eating or drinking restrictions. A healthy diet includes:  Fresh fruits and vegetables.  Whole grains.  Lean meats.  Low-fat dairy products.  Eat extra salt only as directed. Do not add extra salt to your diet unless your health care provider told you to do that.  Eat frequent, small meals.  Avoid standing up suddenly after eating. Medicines  Take over-the-counter and prescription medicines only as told by your health care provider.  Follow instructions from your health care provider about changing the dosage of your current medicines, if this applies.  Do not stop or adjust any of your medicines on your own. General instructions  Wear compression stockings as told by your health care provider.  Get up slowly from lying down or sitting positions. This gives your blood pressure a chance to adjust.  Avoid hot showers and excessive heat as directed by your health care provider.  Return to your normal activities as told by your health care provider. Ask your health care provider what activities are safe for you.  Do not use any products that contain nicotine or tobacco, such as cigarettes and e-cigarettes. If you need help quitting, ask your health care provider.  Keep all follow-up visits as told by your health care provider. This is important. Contact a health care provider if:  You vomit.  You have diarrhea.  You have a  fever for more than 2-3 days.  You feel more thirsty than usual.  You feel weak and tired. Get help right away if:  You have chest pain.  You have a fast or irregular heartbeat.  You develop numbness in any part of your body.  You cannot move your arms or your legs.  You have trouble speaking.  You become sweaty or feel lightheaded.  You faint.  You feel short of breath.  You have trouble staying awake.  You feel confused. This information is not intended to replace advice given to you by your health care provider. Make sure you discuss any questions you have with your health care provider. Document Released: 04/19/2002 Document Revised: 01/16/2016 Document Reviewed: 10/20/2015 Elsevier Interactive Patient Education  2017 [employer].     .

## 2016-06-06 DIAGNOSIS — M0609 Rheumatoid arthritis without rheumatoid factor, multiple sites: Secondary | ICD-10-CM | POA: Diagnosis not present

## 2016-06-06 DIAGNOSIS — Z79899 Other long term (current) drug therapy: Secondary | ICD-10-CM | POA: Diagnosis not present

## 2016-06-06 LAB — BASIC METABOLIC PANEL: Creatinine: 0.6 mg/dL (ref 0.5–1.1)

## 2016-06-06 LAB — HEPATIC FUNCTION PANEL
ALT: 15 U/L (ref 7–35)
AST: 24 U/L (ref 13–35)

## 2016-06-07 LAB — URINE CULTURE: Organism ID, Bacteria: NO GROWTH

## 2016-06-08 NOTE — Assessment & Plan Note (Addendum)
Caused by orthostasis due to autonomic dysfunction. She is not interested in statin therapy, as her cholesterol profile is excellent .  Behavior modification advised to prevent falls .  Advised to use compression stockings.

## 2016-06-10 ENCOUNTER — Telehealth: Payer: Self-pay | Admitting: Internal Medicine

## 2016-06-10 DIAGNOSIS — R4781 Slurred speech: Secondary | ICD-10-CM | POA: Diagnosis not present

## 2016-06-10 DIAGNOSIS — R9082 White matter disease, unspecified: Secondary | ICD-10-CM | POA: Diagnosis not present

## 2016-06-10 DIAGNOSIS — R42 Dizziness and giddiness: Secondary | ICD-10-CM | POA: Diagnosis not present

## 2016-06-10 NOTE — Telephone Encounter (Signed)
YES, OK TO SEND

## 2016-06-10 NOTE — Telephone Encounter (Signed)
Notes and EKG faxed to Dr Trena Platt office

## 2016-06-10 NOTE — Telephone Encounter (Signed)
Pt called and asked if we could send her last office visit and EKG from 11/11/15. She is going to see Dr. Manuella Ghazi this afternoon. Please advise, thank you!  Dr. Jennings Books phone [phone]  Call pt [phone]

## 2016-06-10 NOTE — Telephone Encounter (Signed)
Ok to send please advise

## 2016-06-11 ENCOUNTER — Telehealth: Payer: Self-pay | Admitting: *Deleted

## 2016-06-11 NOTE — Telephone Encounter (Signed)
Pt requested lab results Pt contact [phone]

## 2016-06-12 NOTE — Telephone Encounter (Signed)
LMOM for patient to call back of sx persist and to schedule appt with Dr. Derrel Nip

## 2016-06-13 DIAGNOSIS — R42 Dizziness and giddiness: Secondary | ICD-10-CM | POA: Diagnosis not present

## 2016-06-13 DIAGNOSIS — Z79899 Other long term (current) drug therapy: Secondary | ICD-10-CM | POA: Diagnosis not present

## 2016-06-13 DIAGNOSIS — M0609 Rheumatoid arthritis without rheumatoid factor, multiple sites: Secondary | ICD-10-CM | POA: Diagnosis not present

## 2016-06-13 DIAGNOSIS — M8000XS Age-related osteoporosis with current pathological fracture, unspecified site, sequela: Secondary | ICD-10-CM | POA: Diagnosis not present

## 2016-07-03 ENCOUNTER — Telehealth: Payer: Self-pay | Admitting: Internal Medicine

## 2016-07-03 NOTE — Telephone Encounter (Signed)
Pt would like know if Dr Manuella Ghazi office sent over notes from pt visit from 06/10/16 and MRI?  Please advise? Pt also wants to know if Dr Kyla Balzarine office sent over his notes regarding back issues?   Call pt @ [phone] or cell [phone]. Thank you!

## 2016-07-03 NOTE — Telephone Encounter (Signed)
Left message to call.

## 2016-07-04 NOTE — Telephone Encounter (Signed)
Pt returning your call.   Call pt @ [phone]

## 2016-07-04 NOTE — Telephone Encounter (Signed)
Left message to call.

## 2016-07-04 NOTE — Telephone Encounter (Signed)
Pt called returning your call. Pt just wanted to make sure that we were getting her information from her other doctors. Told pt that I have not seen anything. Pt will call other offices and make sure that we get the notes.

## 2016-07-05 NOTE — Telephone Encounter (Signed)
Patient calls wanting you to review MRI BRAIN from Dr Trena Platt 01/14/16 , showed spots concerning for MS wants your opinion concerning  This. There is a family history of this.

## 2016-07-05 NOTE — Telephone Encounter (Signed)
Left message to call.

## 2016-07-05 NOTE — Telephone Encounter (Signed)
I have no experience in reading MRI'S to  differentiate between changes due to aging and vascular disease vs changes that occur in multiple sclerosis, butI understand that both the radiologist and Dr Manuella Ghazi felt that the changes were not suspicious for MS> If she would like  A second opinion, I will make a referral to [facility].  They will need her to bring the actual images on a disk with her, which she can get from the radiology department.  Let me know if she wants to proceed.

## 2016-07-08 NOTE — Telephone Encounter (Signed)
Left message to call.

## 2016-07-08 NOTE — Telephone Encounter (Signed)
Pt called back returning your call.   Call pt @ [phone]. Thank you!

## 2016-07-09 NOTE — Telephone Encounter (Signed)
Patient advised of below and will discuss with you in further detail at next appointment

## 2016-07-09 NOTE — Telephone Encounter (Signed)
Pt called back returning your call.   Call pt @ [phone] or [phone]

## 2016-07-31 ENCOUNTER — Encounter: Payer: Self-pay | Admitting: Emergency Medicine

## 2016-07-31 ENCOUNTER — Emergency Department
Admission: EM | Admit: 2016-07-31 | Discharge: 2016-07-31 | Disposition: A | Discharge status: Home / Self Care | Payer: Medicare Other | Attending: Emergency Medicine | Admitting: Emergency Medicine

## 2016-07-31 ENCOUNTER — Emergency Department: Payer: Medicare Other

## 2016-07-31 DIAGNOSIS — W1809XA Striking against other object with subsequent fall, initial encounter: Secondary | ICD-10-CM | POA: Insufficient documentation

## 2016-07-31 DIAGNOSIS — Y9389 Activity, other specified: Secondary | ICD-10-CM | POA: Diagnosis not present

## 2016-07-31 DIAGNOSIS — R0781 Pleurodynia: Secondary | ICD-10-CM

## 2016-07-31 DIAGNOSIS — Y929 Unspecified place or not applicable: Secondary | ICD-10-CM | POA: Diagnosis not present

## 2016-07-31 DIAGNOSIS — Y999 Unspecified external cause status: Secondary | ICD-10-CM | POA: Diagnosis not present

## 2016-07-31 DIAGNOSIS — W19XXXA Unspecified fall, initial encounter: Secondary | ICD-10-CM

## 2016-07-31 DIAGNOSIS — Z79899 Other long term (current) drug therapy: Secondary | ICD-10-CM | POA: Diagnosis not present

## 2016-07-31 DIAGNOSIS — Z7982 Long term (current) use of aspirin: Secondary | ICD-10-CM | POA: Insufficient documentation

## 2016-07-31 DIAGNOSIS — S299XXA Unspecified injury of thorax, initial encounter: Secondary | ICD-10-CM | POA: Diagnosis not present

## 2016-07-31 DIAGNOSIS — R1010 Upper abdominal pain, unspecified: Secondary | ICD-10-CM | POA: Diagnosis not present

## 2016-07-31 MED ORDER — LIDOCAINE 5 % EX PTCH: 1.0000 | MEDICATED_PATCH | Freq: Two times a day (BID) | CUTANEOUS | 0 refills | Status: DC

## 2016-07-31 MED ORDER — LIDOCAINE 5 % EX PTCH
1.0000 | MEDICATED_PATCH | CUTANEOUS | Status: DC
  Administered 2016-07-31: 1 via TRANSDERMAL
  Filled 2016-07-31: qty 1

## 2016-07-31 MED ORDER — KETOROLAC TROMETHAMINE 60 MG/2ML IM SOLN: 60.0000 mg | Freq: Once | INTRAMUSCULAR | Status: DC

## 2016-07-31 MED ORDER — OXYCODONE HCL 5 MG PO TABS: 2.5000 mg | ORAL_TABLET | ORAL | 0 refills | Status: DC | PRN

## 2016-07-31 NOTE — ED Provider Notes (Signed)
[facility] Emergency Department Provider Note   ____________________________________________   First MD Initiated Contact with Patient 07/31/16 [phone]     (approximate)  I have reviewed the triage vital signs and the nursing notes.   HISTORY  Chief Complaint Fall    HPI Teresa Lin is a 83 y.o. female who comes into the hospital today with a fall. The patient reports she was standing on one leg putting on lotion and she fell back against her bathtub. She reports that she hit her chest on the left side. The patient has pain in her ribs. She didn't pass out, faint or hit her head. She reports that the pain her left chest hurts really bad. When she is laying down and is not too bad but when she moves it very very severe. The patient rates her pain 8-9 out of 10 in intensity. The patient denies any shortness of breath, dizziness, lightheadedness. She reports that it does not hurt to rest but it is very sore. The patient states that she has broken ribs in the past and it was not found until an MRI was done on the days after the initial injury. The patient is here today for evaluation. She lives at home alone and is concerned about caring for herself at home.   Past Medical History:  Diagnosis Date  . Arthritis   . Collagen vascular disease ([place])   . Colon polyps   . Phlebitis and thrombophlebitis of deep veins of lower extremities ([place])    after surgery    Patient Active Problem List   Diagnosis Date Noted  . Orthostasis 06/05/2016  . Vaginitis and vulvovaginitis 02/08/2016  . Left shoulder pain 10/13/2015  . Venous stasis ulcer of right lower extremity ([place]) 08/08/2015  . Constipation 05/30/2015  . Osteoporosis, postmenopausal 05/30/2015  . Compression fracture of L2 lumbar vertebra with delayed healing 05/29/2015  . Frequent falls 03/26/2015  . Atherosclerosis of arteries 03/26/2015  . Chest pain 02/09/2015  . Medicare annual wellness visit,  subsequent 10/01/2014  . Family history of colon cancer 09/28/2014  . Tenesmus (rectal) 06/12/2014  . TMJ (temporomandibular joint syndrome) 06/12/2014  . Varicose veins of both lower extremities without ulcer or inflammation 06/12/2014  . B12 deficiency 09/20/2013  . Internal hemorrhoids without complication 68/03/5725  . Insomnia 03/03/2013  . Vertigo due to cerebrovascular disease 03/03/2013  . Rheumatoid arthritis ([place]) 03/03/2013    Past Surgical History:  Procedure Laterality Date  . APPENDECTOMY    . JOINT REPLACEMENT Bilateral    knee replacement    Prior to Admission medications   Medication Sig Start Date End Date Taking? Authorizing Provider  alendronate (FOSAMAX) 70 MG tablet Take by mouth. Reported on 11/13/2015 06/14/15  Yes Historical Provider, MD  aspirin 81 MG tablet Take 81 mg by mouth daily.   Yes Historical Provider, MD  calcium-vitamin D (OSCAL WITH D) 500-200 MG-UNIT per tablet Take 1 tablet by mouth. Reported on 11/13/2015   Yes Historical Provider, MD  folic acid (FOLVITE) 1 MG tablet Take 1 mg by mouth daily.    Yes Historical Provider, MD  hydroxychloroquine (PLAQUENIL) 200 MG tablet Take 200 mg by mouth daily.   Yes Historical Provider, MD  Lactobacillus Rhamnosus, GG, (CULTURELLE PO) Take 1 tablet by mouth daily. Probiotic   Yes Historical Provider, MD  methotrexate 50 MG/2ML injection Inject into the skin once a week.  03/16/15  Yes Historical Provider, MD  Multiple Vitamins-Minerals (MULTIVITAMIN WITH MINERALS) tablet Take  1 tablet by mouth daily.   Yes Historical Provider, MD  lidocaine (LIDODERM) 5 % Place 1 patch onto the skin every 12 (twelve) hours. Remove & Discard patch within 12 hours or as directed by MD 07/31/16 07/31/17  Loney Hering, MD  nitroGLYCERIN (NITROSTAT) 0.4 MG SL tablet Place 1 tablet (0.4 mg total) under the tongue every 5 (five) minutes as needed for chest pain. Patient not taking: Reported on 06/05/2016 10/30/15   Crecencio Mc, MD    oxyCODONE (ROXICODONE) 5 MG immediate release tablet Take 0.5 tablets (2.5 mg total) by mouth every 4 (four) hours as needed for severe pain. 07/31/16   Loney Hering, MD    Allergies Infliximab and Synvisc [hylan g-f 20]  Family History  Problem Relation Age of Onset  . Heart disease Mother   . Hypertension Mother   . Cancer Mother     colon ca  . Heart disease Father   . Hypertension Father   . Hypertension Sister   . Multiple sclerosis Daughter   . Multiple sclerosis Son   . Multiple sclerosis Son     Social History Social History  Substance Use Topics  . Smoking status: Never Smoker  . Smokeless tobacco: Never Used  . Alcohol use 1.2 oz/week    2 Cans of beer per week     Comment: occasionally     Review of Systems Constitutional: No fever/chills Eyes: No visual changes. ENT: No sore throat. Cardiovascular:  chest pain. Respiratory: Denies shortness of breath. Gastrointestinal: No abdominal pain.  No nausea, no vomiting.  No diarrhea.  No constipation. Genitourinary: Negative for dysuria. Musculoskeletal: Negative for back pain. Skin: Negative for rash. Neurological: Negative for headaches, focal weakness or numbness.  10-point ROS otherwise negative.  ____________________________________________   PHYSICAL EXAM:  VITAL SIGNS: ED Triage Vitals  Enc Vitals Group     BP 07/31/16 0224 (!) 148/81     Pulse Rate 07/31/16 0223 83     Resp 07/31/16 0223 15     Temp 07/31/16 0223 98.2 F (36.8 C)     Temp Source 07/31/16 0223 Oral     SpO2 07/31/16 0223 100 %     Weight 07/31/16 0223 140 lb (63.5 kg)     Height 07/31/16 0223 5\' 6"  (1.676 m)     Head Circumference --      Peak Flow --      Pain Score --      Pain Loc --      Pain Edu? --      Excl. in [place]? --     Constitutional: Alert and oriented. Well appearing and in Moderate distress. Eyes: Conjunctivae are normal. PERRL. EOMI. Head: Atraumatic. Nose: No congestion/rhinnorhea. Mouth/Throat:  Mucous membranes are moist.  Oropharynx non-erythematous. Neck: No cervical spine tenderness to palpation. Cardiovascular: Normal rate, regular rhythm. Grossly normal heart sounds.  Good peripheral circulation. Respiratory: Normal respiratory effort.  No retractions. Lungs CTAB. Left lateral chest wall tenderness to palpation Gastrointestinal: Soft and nontender. No distention. Positive bowel sounds Musculoskeletal: No lower extremity tenderness nor edema.   Neurologic:  Normal speech and language.  Skin:  Skin is warm, dry and intact.  Psychiatric: Mood and affect are normal.   ____________________________________________   LABS (all labs ordered are listed, but only abnormal results are displayed)  Labs Reviewed - No data to display ____________________________________________  EKG  none ____________________________________________  RADIOLOGY  CXR ____________________________________________   PROCEDURES  Procedure(s) performed: None  Procedures  Critical Care performed: No  ____________________________________________   INITIAL IMPRESSION / ASSESSMENT AND PLAN / ED COURSE  Pertinent labs & imaging results that were available during my care of the patient were reviewed by me and considered in my medical decision making (see chart for details).  This is an 83 year old female who comes into the hospital today after a fall onto the left side of her chest. The patient is concerned about possible rib fractures. The patient's chest x-ray does not show any rib fractures but I did inform her that my concern is for the underlying lung tissue showing a contusion, pneumonia or puncture. The patient's chest x-ray shows neither of those things either. I initially offered the patient some pain medication but she declined saying she doesn't like how it makes her feel. I gave the patient a Lidoderm patch to her chest and some Toradol. The patient before the medication was seen getting  up and walking around and moving to the bathroom. She was a little slower and uncomfortable but she was able to move around. The patient is concerned again about being home by herself but I did inform her that she should be able to get around just taking it easy. The patient did refuse the Toradol as she fell she could just take ibuprofen at home. We did monitor her while she was waiting for her daughter-in-law to pick her up. She will be discharged to home.  Clinical Course as of Jul 31 741  Wed Jul 31, 2016  0345 No active cardiopulmonary disease. DG Chest 2 View [AW]    Clinical Course User Index [AW] Loney Hering, MD     ____________________________________________   FINAL CLINICAL IMPRESSION(S) / ED DIAGNOSES  Final diagnoses:  Fall, initial encounter  Rib pain on left side      NEW MEDICATIONS STARTED DURING THIS VISIT:  New Prescriptions   LIDOCAINE (LIDODERM) 5 %    Place 1 patch onto the skin every 12 (twelve) hours. Remove & Discard patch within 12 hours or as directed by MD   OXYCODONE (ROXICODONE) 5 MG IMMEDIATE RELEASE TABLET    Take 0.5 tablets (2.5 mg total) by mouth every 4 (four) hours as needed for severe pain.     Note:  This document was prepared using Dragon voice recognition software and may include unintentional dictation errors.    Loney Hering, MD 07/31/16 [phone]

## 2016-07-31 NOTE — ED Notes (Signed)
Pt states family member will be here at 0830 to pick up.

## 2016-07-31 NOTE — ED Notes (Signed)
Pt up to restroom with steady gait. Assisted back to stretcher and provided for comfort and safety. Will continue to assess.

## 2016-07-31 NOTE — ED Triage Notes (Signed)
Pt arrived to ED by EMS from home post fall. EMS reports pt had a mechanical fall coming out of bathtub at approximatly 2200 last night. Pt denies LOC and c/o of left sided rib pain, denies SOB.

## 2016-07-31 NOTE — ED Notes (Signed)
Pt ready to go, requesting to be walked to lobby to wait on daughter in law.  This RN walked with pt to lobby at this time.  First RN made aware of expected arrival of family member.

## 2016-08-02 ENCOUNTER — Encounter: Payer: Self-pay | Admitting: Internal Medicine

## 2016-08-02 ENCOUNTER — Ambulatory Visit (INDEPENDENT_AMBULATORY_CARE_PROVIDER_SITE_OTHER): Payer: Medicare Other | Admitting: Internal Medicine

## 2016-08-02 ENCOUNTER — Ambulatory Visit (INDEPENDENT_AMBULATORY_CARE_PROVIDER_SITE_OTHER): Payer: Medicare Other

## 2016-08-02 VITALS — BP 150/76 | HR 94 | Temp 98.2°F | Resp 15 | Ht 66.0 in | Wt 143.8 lb

## 2016-08-02 DIAGNOSIS — M810 Age-related osteoporosis without current pathological fracture: Secondary | ICD-10-CM

## 2016-08-02 DIAGNOSIS — K5903 Drug induced constipation: Secondary | ICD-10-CM

## 2016-08-02 DIAGNOSIS — R071 Chest pain on breathing: Secondary | ICD-10-CM | POA: Diagnosis not present

## 2016-08-02 DIAGNOSIS — S299XXA Unspecified injury of thorax, initial encounter: Secondary | ICD-10-CM | POA: Diagnosis not present

## 2016-08-02 DIAGNOSIS — R0781 Pleurodynia: Secondary | ICD-10-CM

## 2016-08-02 DIAGNOSIS — R296 Repeated falls: Secondary | ICD-10-CM

## 2016-08-02 MED ORDER — HYDROCODONE-ACETAMINOPHEN 5-325 MG PO TABS: 1.0000 | ORAL_TABLET | Freq: Four times a day (QID) | ORAL | 0 refills | Status: DC | PRN

## 2016-08-02 NOTE — Progress Notes (Signed)
Pre visit review using our clinic review tool, if applicable. No additional management support is needed unless otherwise documented below in the visit note. 

## 2016-08-02 NOTE — Progress Notes (Signed)
Subjective:  Patient ID: Teresa Lin, female    DOB: 01/16/1932  Age: 83 y.o. MRN: [id number]  CC: The primary encounter diagnosis was Rib pain on left side. Diagnoses of Frequent falls, Chest pain on breathing, Drug-induced constipation, and Osteoporosis, postmenopausal were also pertinent to this visit.  HPI Teresa Lin presents for follow up on recent fall resulting in ER visit.  She was evaluated at [facility] ER on 3/21 after injuring her lateral chest wall during a fall at home.  She fell against her bathtub  While standing on one leg to apply lotion and struck her left ribcage on the side of the tub .  She developed severe pain aggravated by motion and palpation.  Did not notice any bruising to chest wall. . 2V chest  was negative for fracture, question of  Lung contusion was raised.  She refused toradol injection offered by ER physician.She was discharged from  ER with rx for transdermal lidocaine and rx for #10 oxycodone, which she states she has not taken, but has brought with her and only 8 are remaining in the bottle.    She has been taking  tylenol and motrin in subtherapeutic doses and still having moderate to severe pain.  She is afraid to cough. Denies dyspnea.   2) she has been unable to move her bowels for the past 3 days,  tried only citrucel  Just once. She overestimates her dajly intake of water , averages 3 8 ounce servings.    3) Osteoporosis:  Has been taking alendronate weekly since June 2017, denies esophageal pain , but is concerned about continued use because thinks she was told by "someone" that she should not be taking it with one of her concurrent medications,  But cannot remember any details  Outpatient Medications Prior to Visit  Medication Sig Dispense Refill  . alendronate (FOSAMAX) 70 MG tablet Take by mouth. Reported on 11/13/2015    . aspirin 81 MG tablet Take 81 mg by mouth daily.    . calcium-vitamin D (OSCAL WITH D) 500-200 MG-UNIT per tablet Take 1 tablet by mouth.  Reported on 11/16/7339    . folic acid (FOLVITE) 1 MG tablet Take 1 mg by mouth daily.     . hydroxychloroquine (PLAQUENIL) 200 MG tablet Take 200 mg by mouth daily.    . Lactobacillus Rhamnosus, GG, (CULTURELLE PO) Take 1 tablet by mouth daily. Probiotic    . lidocaine (LIDODERM) 5 % Place 1 patch onto the skin every 12 (twelve) hours. Remove & Discard patch within 12 hours or as directed by MD 10 patch 0  . methotrexate 50 MG/2ML injection Inject into the skin once a week.     . Multiple Vitamins-Minerals (MULTIVITAMIN WITH MINERALS) tablet Take 1 tablet by mouth daily.    . nitroGLYCERIN (NITROSTAT) 0.4 MG SL tablet Place 1 tablet (0.4 mg total) under the tongue every 5 (five) minutes as needed for chest pain. 50 tablet 3  . oxyCODONE (ROXICODONE) 5 MG immediate release tablet Take 0.5 tablets (2.5 mg total) by mouth every 4 (four) hours as needed for severe pain. 10 tablet 0   No facility-administered medications prior to visit.     Review of Systems;  Patient denies headache, fevers, malaise, unintentional weight loss, skin rash, eye pain, sinus congestion and sinus pain, sore throat, dysphagia,  hemoptysis , cough, dyspnea, wheezing, chest pain, palpitations, orthopnea, edema, abdominal pain, nausea, melena, diarrhea, constipation, flank pain, dysuria, hematuria, urinary  Frequency, nocturia, numbness,  tingling, seizures,  Focal weakness, Loss of consciousness,  Tremor, insomnia, depression, anxiety, and suicidal ideation.      Objective:  BP (!) 150/76 (BP Location: Left Arm, Patient Position: Sitting, Cuff Size: Normal)   Pulse 94   Temp 98.2 F (36.8 C) (Oral)   Resp 15   Ht 5\' 6"  (1.676 m)   Wt 143 lb 12.8 oz (65.2 kg)   SpO2 95%   BMI 23.21 kg/m   BP Readings from Last 3 Encounters:  08/02/16 (!) 150/76  07/31/16 137/78  06/05/16 136/68    Wt Readings from Last 3 Encounters:  08/02/16 143 lb 12.8 oz (65.2 kg)  07/31/16 140 lb (63.5 kg)  06/05/16 140 lb (63.5 kg)     General appearance: alert, cooperative and appears stated age Ears: normal TM's and external ear canals both ears Throat: lips, mucosa, and tongue normal; teeth and gums normal Neck: no adenopathy, no carotid bruit, supple, symmetrical, trachea midline and thyroid not enlarged, symmetric, no tenderness/mass/nodules Back: symmetric, no curvature. ROM normal. No CVA tenderness. Lungs: clear to auscultation bilaterally Heart: regular rate and rhythm, S1, S2 normal, no murmur, click, rub or gallop Abdomen: soft, non-tender; bowel sounds normal; no masses,  no organomegaly Pulses: 2+ and symmetric Skin: Skin color, texture, turgor normal. No rashes or lesions Lymph nodes: Cervical, supraclavicular, and axillary nodes normal.  No results found for: HGBA1C  Lab Results  Component Value Date   CREATININE 0.61 11/10/2015   CREATININE 0.51 10/13/2015   CREATININE 0.58 08/28/2015    Lab Results  Component Value Date   WBC 5.7 11/10/2015   HGB 13.3 11/10/2015   HCT 39.5 11/10/2015   PLT 217 11/10/2015   GLUCOSE 118 (H) 11/10/2015   CHOL 181 08/28/2015   TRIG 83.0 08/28/2015   HDL 62.90 08/28/2015   LDLCALC 102 (H) 08/28/2015   ALT 17 10/13/2015   AST 27 10/13/2015   NA 135 11/10/2015   K 4.1 11/10/2015   CL 99 (L) 11/10/2015   CREATININE 0.61 11/10/2015   BUN 13 11/10/2015   CO2 27 11/10/2015   TSH 2.04 08/28/2015   INR 0.9 08/08/2011    Dg Chest 2 View  Result Date: 07/31/2016 CLINICAL DATA:  83 year old female with fall and left-sided rib pain. EXAM: CHEST  2 VIEW COMPARISON:  Chest radiograph dated 10/13/2015 FINDINGS: The lungs are clear. There is no pleural effusion or pneumothorax. The cardiac silhouette is within normal limits. There is osteopenia with degenerative changes of the spine. Upper lumbar compression deformity appears similar to prior radiograph. No acute osseous pathology. IMPRESSION: No active cardiopulmonary disease. Electronically Signed   By: Anner Crete M.D.   On: 07/31/2016 03:10    Assessment & Plan:   Problem List Items Addressed This Visit    Chest pain    Current pain is Left lateral chest wall,  Secondary to fall.  Unilateral rib films were done today in office to rule out fractures of ribs and were negative. She is not using oxycodone because it was too strong and has 8 of the prescribed 10 remaining in bottle. ontinue symptomatic treatment of pain to prevent atx and subsequent pneumonia.  Patient was given explicit instructions  On how to use tylenol and motrin,  And was given an rx for  #20 vicodin to use if tylenol and motrin do not relieve her pain       Constipation    I suspect that she did actually take 2 of the oxycodone  and does not remember, given her change in bowels.  regimen outlined      Frequent falls    She has had 3 or 4 falls in the last year, and appears to have some mild cognitive deficits, and recurrent vertigo. Previous CT head done in ED show chronic microvascular disease.   Neurology evaluation has confirmed same with MRI .  Patient has a safety rail and a chair in her bathroom , but does not use them . Long discussion about fall prevention       Osteoporosis, postmenopausal    Reviewed her medications,  Renal function,  Symptoms and the risks and benefits of alendronate,  And the DEXA repeated after vertebral fracture which was done at [facility] clinic. I see no reason to stop the medication that Dr Jefm Bryant has initiated.        Other Visit Diagnoses    Rib pain on left side    -  Primary   Relevant Orders   DG Ribs Unilateral Left (Completed)      I am having Teresa Lin start on HYDROcodone-acetaminophen. I am also having her maintain her folic acid, hydroxychloroquine, aspirin, calcium-vitamin D, multivitamin with minerals, methotrexate, alendronate, nitroGLYCERIN, (Lactobacillus Rhamnosus, GG, (CULTURELLE PO)), lidocaine, oxyCODONE, ibuprofen, and acetaminophen.  Meds ordered this encounter   Medications  . ibuprofen (ADVIL,MOTRIN) 200 MG tablet    Sig: Take 200 mg by mouth every 6 (six) hours as needed.  Marland Kitchen acetaminophen (TYLENOL) 325 MG tablet    Sig: Take 650 mg by mouth every 6 (six) hours as needed.  Marland Kitchen HYDROcodone-acetaminophen (NORCO/VICODIN) 5-325 MG tablet    Sig: Take 1 tablet by mouth every 6 (six) hours as needed for moderate pain. May refill on or after August 03 2016    Dispense:  20 tablet    Refill:  0   A total of 40 minutes was spent with patient more than half of which was spent in counseling patient on the above mentioned issues , reviewing and explaining recent labs and imaging studies done, and coordination of care.   There are no discontinued medications.  Follow-up: No Follow-up on file.   Crecencio Mc, MD

## 2016-08-02 NOTE — Patient Instructions (Addendum)
You can use the lidocaine  patch,  Add 600 mg ibuprofen every 8 hours,  And you can take 500 mg tylenol every 8 hours   If your pain is still severe with this regimen you can ADD the vicodin  In addition ,  BUT Not more than 2 daily on the first day (TO AVOID TOO MUCH TYLENOL DURING A 24 HOUR PERIOD)  .  Marland Kitchen  On day 2 you can stop the tylenol ,  And substitute vicodin for tylenol and take up to 4  OF THEM daily , ALONG WITH YOUR IBUPROFEN AND LIDOCAINE .Marland Kitchen   FOR YOUR CONSTIPATION:  Take citrucel and add a little miralax to it   Add a stool softener (Colace,  Docusate) 200 mg at bedtime  Make sure yoU are drinking at least 3 16 ounce bottles  Of water     Yo may need to take dulcolax for severe constipation

## 2016-08-04 NOTE — Assessment & Plan Note (Signed)
She has had 3 or 4 falls in the last year, and appears to have some mild cognitive deficits, and recurrent vertigo. Previous CT head done in ED show chronic microvascular disease.   Neurology evaluation has confirmed same with MRI .  Patient has a safety rail and a chair in her bathroom , but does not use them . Long discussion about fall prevention

## 2016-08-04 NOTE — Assessment & Plan Note (Signed)
I suspect that she did actually take 2 of the oxycodone and does not remember, given her change in bowels.  regimen outlined

## 2016-08-04 NOTE — Assessment & Plan Note (Addendum)
Current pain is Left lateral chest wall,  Secondary to fall.  Unilateral rib films were done today in office to rule out fractures of ribs and were negative. She is not using oxycodone because it was too strong and has 8 of the prescribed 10 remaining in bottle. ontinue symptomatic treatment of pain to prevent atx and subsequent pneumonia.  Patient was given explicit instructions  On how to use tylenol and motrin,  And was given an rx for  #20 vicodin to use if tylenol and motrin do not relieve her pain

## 2016-08-04 NOTE — Assessment & Plan Note (Signed)
Reviewed her medications,  Renal function,  Symptoms and the risks and benefits of alendronate,  And the DEXA repeated after vertebral fracture which was done at [facility] clinic. I see no reason to stop the medication that Dr Jefm Bryant has initiated.

## 2016-08-07 ENCOUNTER — Encounter: Payer: Self-pay | Admitting: Family

## 2016-08-07 ENCOUNTER — Ambulatory Visit (INDEPENDENT_AMBULATORY_CARE_PROVIDER_SITE_OTHER): Payer: Medicare Other | Admitting: Family

## 2016-08-07 VITALS — BP 142/68 | HR 88 | Temp 97.9°F | Ht 66.0 in | Wt 144.4 lb

## 2016-08-07 DIAGNOSIS — B029 Zoster without complications: Secondary | ICD-10-CM | POA: Diagnosis not present

## 2016-08-07 MED ORDER — VALACYCLOVIR HCL 1 G PO TABS: 1000.0000 mg | ORAL_TABLET | Freq: Three times a day (TID) | ORAL | 0 refills | Status: DC

## 2016-08-07 NOTE — Progress Notes (Signed)
Subjective:    Patient ID: Teresa Lin, female    DOB: [birth year], 83 y.o.   MRN: [id number]  CC: Teresa Lin is a 83 y.o. female who presents today for an acute visit.    HPI: CC: rash proximal to abdomen x 2 days, unchanged.Describes as itchy.   Did have lidocaine patch on left low back 3/23 after fall and wanders if allergic. Has been taking ibuprofen for pain with resolve.  Otherwise 'feels well.' Side pain from fall is also getting better.    No zoster vaccine; h/o zoster    HISTORY:  Past Medical History:  Diagnosis Date  . Arthritis   . Collagen vascular disease ([place])   . Colon polyps   . Phlebitis and thrombophlebitis of deep veins of lower extremities ([place])    after surgery   Past Surgical History:  Procedure Laterality Date  . APPENDECTOMY    . JOINT REPLACEMENT Bilateral    knee replacement   Family History  Problem Relation Age of Onset  . Heart disease Mother   . Hypertension Mother   . Cancer Mother     colon ca  . Heart disease Father   . Hypertension Father   . Hypertension Sister   . Multiple sclerosis Daughter   . Multiple sclerosis Son   . Multiple sclerosis Son     Allergies: Infliximab and Synvisc [hylan g-f 20] Current Outpatient Prescriptions on File Prior to Visit  Medication Sig Dispense Refill  . acetaminophen (TYLENOL) 325 MG tablet Take 650 mg by mouth every 6 (six) hours as needed.    Marland Kitchen alendronate (FOSAMAX) 70 MG tablet Take by mouth. Reported on 11/13/2015    . aspirin 81 MG tablet Take 81 mg by mouth daily.    . calcium-vitamin D (OSCAL WITH D) 500-200 MG-UNIT per tablet Take 1 tablet by mouth. Reported on 09/17/8467    . folic acid (FOLVITE) 1 MG tablet Take 1 mg by mouth daily.     Marland Kitchen HYDROcodone-acetaminophen (NORCO/VICODIN) 5-325 MG tablet Take 1 tablet by mouth every 6 (six) hours as needed for moderate pain. May refill on or after August 03 2016 20 tablet 0  . hydroxychloroquine (PLAQUENIL) 200 MG tablet Take 200 mg by mouth  daily.    Marland Kitchen ibuprofen (ADVIL,MOTRIN) 200 MG tablet Take 200 mg by mouth every 6 (six) hours as needed.    . Lactobacillus Rhamnosus, GG, (CULTURELLE PO) Take 1 tablet by mouth daily. Probiotic    . lidocaine (LIDODERM) 5 % Place 1 patch onto the skin every 12 (twelve) hours. Remove & Discard patch within 12 hours or as directed by MD 10 patch 0  . methotrexate 50 MG/2ML injection Inject into the skin once a week.     . Multiple Vitamins-Minerals (MULTIVITAMIN WITH MINERALS) tablet Take 1 tablet by mouth daily.    . nitroGLYCERIN (NITROSTAT) 0.4 MG SL tablet Place 1 tablet (0.4 mg total) under the tongue every 5 (five) minutes as needed for chest pain. 50 tablet 3   No current facility-administered medications on file prior to visit.     Social History  Substance Use Topics  . Smoking status: Never Smoker  . Smokeless tobacco: Never Used  . Alcohol use 1.2 oz/week    2 Cans of beer per week     Comment: occasionally     Review of Systems  Constitutional: Negative for chills and fever.  Respiratory: Negative for cough.   Cardiovascular: Negative for chest pain and palpitations.  Gastrointestinal: Negative for nausea and vomiting.  Skin: Positive for rash.      Objective:    BP (!) 142/68   Pulse 88   Temp 97.9 F (36.6 C) (Oral)   Ht 5\' 6"  (1.676 m)   Wt 144 lb 6.4 oz (65.5 kg)   SpO2 98%   BMI 23.31 kg/m    Physical Exam  Constitutional: She appears well-developed and well-nourished.  Eyes: Conjunctivae are normal.  Cardiovascular: Normal rate, regular rhythm, normal heart sounds and normal pulses.   Pulmonary/Chest: Effort normal and breath sounds normal. She has no wheezes. She has no rhonchi. She has no rales.  Neurological: She is alert.  Skin: Skin is warm and dry. Rash noted. Rash is vesicular.     Vesicular lesions as noted left side of abdomen. Non draining. No erythema, streaking.   Psychiatric: She has a normal mood and affect. Her speech is normal and  behavior is normal. Thought content normal.  Vitals reviewed.      Assessment & Plan:  1. Herpes zoster without complication Symptoms most consistent with zoster. Will start antiviral. If doesn't resolve, considering contact dermatitis and advised topical steroid.   - valACYclovir (VALTREX) 1000 MG tablet; Take 1 tablet (1,000 mg total) by mouth 3 (three) times daily.  Dispense: 21 tablet; Refill: 0     I have discontinued Teresa Lin's oxyCODONE. I am also having her start on valACYclovir. Additionally, I am having her maintain her folic acid, hydroxychloroquine, aspirin, calcium-vitamin D, multivitamin with minerals, methotrexate, alendronate, nitroGLYCERIN, (Lactobacillus Rhamnosus, GG, (CULTURELLE PO)), lidocaine, ibuprofen, acetaminophen, and HYDROcodone-acetaminophen.   Meds ordered this encounter  Medications  . valACYclovir (VALTREX) 1000 MG tablet    Sig: Take 1 tablet (1,000 mg total) by mouth 3 (three) times daily.    Dispense:  21 tablet    Refill:  0    Order Specific Question:   Supervising Provider    Answer:   Teresa Lin [[id number]]    Return precautions given.   Risks, benefits, and alternatives of the medications and treatment plan prescribed today were discussed, and patient expressed understanding.   Education regarding symptom management and diagnosis given to patient on AVS.  Continue to follow with TULLO, Aris Everts, MD for routine health maintenance.   Teresa Lin and I agreed with plan.   Teresa Paris, FNP

## 2016-08-07 NOTE — Progress Notes (Signed)
Pre visit review using our clinic review tool, if applicable. No additional management support is needed unless otherwise documented below in the visit note. 

## 2016-08-07 NOTE — Patient Instructions (Signed)
Start valtrex  Stop tyleonol since not working  Continue ibuprofen every 6 hours- you may take 2 tablets for a total of 400mg  - be sure to take with food  If doesn't respond to valtrex, suspect contact dermatitis and I would advise you to buy over the counter topical benadryl cream for itch and also cortizone-10 cream.    If there is no improvement in your symptoms, or if there is any worsening of symptoms, or if you have any additional concerns, please return for re-evaluation; or, if we are closed, consider going to the Emergency Room for evaluation if symptoms urgent.

## 2016-08-29 ENCOUNTER — Ambulatory Visit: Payer: Medicare Other

## 2016-08-30 ENCOUNTER — Ambulatory Visit (INDEPENDENT_AMBULATORY_CARE_PROVIDER_SITE_OTHER): Payer: Medicare Other

## 2016-08-30 VITALS — BP 110/70 | HR 84 | Temp 98.1°F | Resp 14 | Ht 66.0 in | Wt 143.4 lb

## 2016-08-30 DIAGNOSIS — Z0000 Encounter for general adult medical examination without abnormal findings: Secondary | ICD-10-CM

## 2016-08-30 NOTE — Progress Notes (Signed)
Subjective:   Teresa Lin is a 83 y.o. female who presents for Medicare Annual (Subsequent) preventive examination.  Review of Systems:  No ROS.  Medicare Wellness Visit.  Cardiac Risk Factors include: advanced age (>108men, >21 women)     Objective:     Vitals: BP 110/70 (BP Location: Left Arm, Patient Position: Sitting, Cuff Size: Normal)   Pulse 84   Temp 98.1 F (36.7 C) (Oral)   Resp 14   Ht 5\' 6"  (1.676 m)   Wt 143 lb 6.4 oz (65 kg)   SpO2 97%   BMI 23.15 kg/m   Body mass index is 23.15 kg/m.   Tobacco History  Smoking Status  . Never Smoker  Smokeless Tobacco  . Never Used     Counseling given: Not Answered   Past Medical History:  Diagnosis Date  . Arthritis   . Collagen vascular disease ([place])   . Colon polyps   . Phlebitis and thrombophlebitis of deep veins of lower extremities ([place])    after surgery   Past Surgical History:  Procedure Laterality Date  . APPENDECTOMY    . JOINT REPLACEMENT Bilateral    knee replacement   Family History  Problem Relation Age of Onset  . Hypertension Mother   . Cancer Mother     colon ca  . Heart attack Mother   . Hypertension Father   . Heart attack Father   . Hypertension Sister   . Heart attack Sister   . Multiple sclerosis Daughter   . Multiple sclerosis Son   . Multiple sclerosis Son    History  Sexual Activity  . Sexual activity: No    Outpatient Encounter Prescriptions as of 08/30/2016  Medication Sig  . alendronate (FOSAMAX) 70 MG tablet Take by mouth. Reported on 11/13/2015  . aspirin 81 MG tablet Take 81 mg by mouth daily.  . calcium-vitamin D (OSCAL WITH D) 500-200 MG-UNIT per tablet Take 1 tablet by mouth. Reported on 9/0/2409  . folic acid (FOLVITE) 1 MG tablet Take 1 mg by mouth daily.   . hydroxychloroquine (PLAQUENIL) 200 MG tablet Take 200 mg by mouth daily.  Marland Kitchen ibuprofen (ADVIL,MOTRIN) 200 MG tablet Take 200 mg by mouth every 6 (six) hours as needed.  . Lactobacillus Rhamnosus, GG,  (CULTURELLE PO) Take 1 tablet by mouth daily. Probiotic  . MELATONIN PO Take 1.5 tablets by mouth at bedtime.  . methotrexate 50 MG/2ML injection Inject into the skin once a week.   . Multiple Vitamins-Minerals (MULTIVITAMIN WITH MINERALS) tablet Take 1 tablet by mouth daily.  . [DISCONTINUED] acetaminophen (TYLENOL) 325 MG tablet Take 650 mg by mouth every 6 (six) hours as needed.  . [DISCONTINUED] HYDROcodone-acetaminophen (NORCO/VICODIN) 5-325 MG tablet Take 1 tablet by mouth every 6 (six) hours as needed for moderate pain. May refill on or after August 03 2016  . [DISCONTINUED] lidocaine (LIDODERM) 5 % Place 1 patch onto the skin every 12 (twelve) hours. Remove & Discard patch within 12 hours or as directed by MD  . [DISCONTINUED] nitroGLYCERIN (NITROSTAT) 0.4 MG SL tablet Place 1 tablet (0.4 mg total) under the tongue every 5 (five) minutes as needed for chest pain.  . [DISCONTINUED] valACYclovir (VALTREX) 1000 MG tablet Take 1 tablet (1,000 mg total) by mouth 3 (three) times daily.   No facility-administered encounter medications on file as of 08/30/2016.     Activities of Daily Living In your present state of health, do you have any difficulty performing the following activities:  08/30/2016 10/13/2015  Hearing? N N  Vision? N N  Difficulty concentrating or making decisions? N N  Walking or climbing stairs? N N  Dressing or bathing? N N  Doing errands, shopping? N N  Preparing Food and eating ? N -  Using the Toilet? N -  In the past six months, have you accidently leaked urine? N -  Do you have problems with loss of bowel control? N -  Managing your Medications? N -  Managing your Finances? N -  Housekeeping or managing your Housekeeping? N -  Some recent data might be hidden    Patient Care Team: Crecencio Mc, MD as PCP - General (Internal Medicine)    Assessment:    This is a routine wellness examination for Teresa Lin. The goal of the wellness visit is to assist the patient how  to close the gaps in care and create a preventative care plan for the patient.   Osteoporosis reviewed.  Medications reviewed; taking without issues or barriers.  Safety issues reviewed; life alert and alarm system with smoke detectors in the home. No firearms in the home.  Wears seatbelts when driving or riding with others. Patient does wear sunscreen or protective clothing when in direct sunlight. No violence in the home.  Patient is alert, normal appearance, oriented to person/place/and time. Correctly identified the president of the [place], recall of 3/3 words, and performing simple calculations.  Patient displays appropriate judgement and can read correct time from watch face.  No new identified risk were noted.  No failures at ADL's or IADL's.   BMI- discussed the importance of a healthy diet, water intake and exercise. Educational material provided.   Dental- every six months.  Eye- Visual acuity not assessed per patient preference since they have regular follow up with the ophthalmologist.  Wears corrective lenses for reading.  Sleep patterns- Sleeps 5 hours at night.  Wakes feeling rested.  Health maintenance gaps- closed.  Patient Concerns: None at this time. Follow up with PCP as needed.  Exercise Activities and Dietary recommendations Current Exercise Habits: Home exercise routine, Type of exercise: walking, Time (Minutes): 10, Intensity: Mild  Goals    . Increase physical activity          Stay hydrated      Fall Risk Fall Risk  08/30/2016 08/02/2016 12/26/2015 10/13/2015 08/30/2015  Falls in the past year? Yes Yes Yes Yes Yes  Number falls in past yr: 2 or more 2 or more 2 or more 2 or more 1  Injury with Fall? Yes Yes Yes Yes Yes  Risk Factor Category  High Fall Risk High Fall Risk - High Fall Risk -  Risk for fall due to : History of fall(s) - - History of fall(s) Other (Comment)  Risk for fall due to (comments): - - - - In an unfamiliar place  Follow up  Falls prevention discussed;Education provided - - Education provided;Falls prevention discussed Falls prevention discussed   Depression Screen PHQ 2/9 Scores 08/30/2016 12/26/2015 10/13/2015 08/30/2015  PHQ - 2 Score 0 - 0 0  Exception Documentation - Patient refusal - -     Cognitive Function MMSE - Mini Mental State Exam 08/30/2016  Orientation to time 5  Orientation to Place 5  Registration 3  Attention/ Calculation 5  Recall 3  Language- name 2 objects 2  Language- repeat 1  Language- follow 3 step command 3  Language- read & follow direction 1  Write a sentence 1  Copy  design 1  Total score 30        Immunization History  Administered Date(s) Administered  . Influenza Split 04/20/2013  . Influenza, High Dose Seasonal PF 03/24/2015, 04/08/2016  . PPD Test 01/10/2014  . Pneumococcal Conjugate-13 09/28/2014  . Pneumococcal Polysaccharide-23 04/20/2013  . Tdap 09/13/2013   Screening Tests Health Maintenance  Topic Date Due  . INFLUENZA VACCINE  12/11/2016  . TETANUS/TDAP  09/14/2023  . DEXA SCAN  Completed  . PNA vac Low Risk Adult  Completed      Plan:    End of life planning; Advance aging; Advanced directives discussed. Copy of current HCPOA/Living Will requested.    Medicare Attestation I have personally reviewed: The patient's medical and social history Their use of alcohol, tobacco or illicit drugs Their current medications and supplements The patient's functional ability including ADLs,fall risks, home safety risks, cognitive, and hearing and visual impairment Diet and physical activities Evidence for depression   The patient's weight, height, BMI, and visual acuity have been recorded in the chart.  I have made referrals and provided education to the patient based on review of the above and I have provided the patient with a written personalized care plan for preventive services.    During the course of the visit the patient was educated and counseled  about the following appropriate screening and preventive services:   Vaccines to include Pneumoccal, Influenza, Hepatitis B, Td, Zostavax, HCV  Colorectal cancer screening-UTD  Bone density screening-UTD  Glaucoma screening-annual eye exam  Mammography-UTD  Nutrition counseling   Patient Instructions (the written plan) was given to the patient.   Varney Biles, LPN  4/70/7615

## 2016-08-30 NOTE — Patient Instructions (Addendum)
  Teresa Lin , Thank you for taking time to come for your Medicare Wellness Visit. I appreciate your ongoing commitment to your health goals. Please review the following plan we discussed and let me know if I can assist you in the future.  Follow up with Dr. Derrel Nip as needed.    Bring a copy of your [place] and/or Living Will to be scanned into chart.  Have a great day!   These are the goals we discussed: Goals    . Increase physical activity          Stay hydrated       This is a list of the screening recommended for you and due dates:  Health Maintenance  Topic Date Due  . Flu Shot  12/11/2016  . Tetanus Vaccine  09/14/2023  . DEXA scan (bone density measurement)  Completed  . Pneumonia vaccines  Completed

## 2016-09-01 NOTE — Progress Notes (Signed)
  I have reviewed the above information and agree with above.   Teresa Tullo, MD 

## 2016-09-02 ENCOUNTER — Encounter: Payer: Self-pay | Admitting: Internal Medicine

## 2016-09-02 ENCOUNTER — Ambulatory Visit (INDEPENDENT_AMBULATORY_CARE_PROVIDER_SITE_OTHER): Payer: Medicare Other | Admitting: Internal Medicine

## 2016-09-02 VITALS — BP 116/64 | HR 74 | Temp 97.9°F | Resp 15 | Ht 65.5 in | Wt 141.2 lb

## 2016-09-02 DIAGNOSIS — R0782 Intercostal pain: Secondary | ICD-10-CM

## 2016-09-02 DIAGNOSIS — Z1239 Encounter for other screening for malignant neoplasm of breast: Secondary | ICD-10-CM

## 2016-09-02 DIAGNOSIS — R296 Repeated falls: Secondary | ICD-10-CM

## 2016-09-02 DIAGNOSIS — M0579 Rheumatoid arthritis with rheumatoid factor of multiple sites without organ or systems involvement: Secondary | ICD-10-CM | POA: Diagnosis not present

## 2016-09-02 DIAGNOSIS — G4700 Insomnia, unspecified: Secondary | ICD-10-CM | POA: Diagnosis not present

## 2016-09-02 DIAGNOSIS — I7090 Unspecified atherosclerosis: Secondary | ICD-10-CM

## 2016-09-02 DIAGNOSIS — I708 Atherosclerosis of other arteries: Secondary | ICD-10-CM | POA: Diagnosis not present

## 2016-09-02 DIAGNOSIS — R42 Dizziness and giddiness: Secondary | ICD-10-CM | POA: Diagnosis not present

## 2016-09-02 DIAGNOSIS — Z1231 Encounter for screening mammogram for malignant neoplasm of breast: Secondary | ICD-10-CM

## 2016-09-02 DIAGNOSIS — E538 Deficiency of other specified B group vitamins: Secondary | ICD-10-CM | POA: Diagnosis not present

## 2016-09-02 DIAGNOSIS — M810 Age-related osteoporosis without current pathological fracture: Secondary | ICD-10-CM

## 2016-09-02 DIAGNOSIS — I679 Cerebrovascular disease, unspecified: Secondary | ICD-10-CM

## 2016-09-02 NOTE — Progress Notes (Signed)
Pre visit review using our clinic review tool, if applicable. No additional management support is needed unless otherwise documented below in the visit note. 

## 2016-09-02 NOTE — Patient Instructions (Signed)
Your mammogram has been ordered,  It is due after Jun 8   posture  Please do the exercises I demonstrated to you today DAILY:   For the upper back: Roll your shoulder frontwards and backwards . 10 times ,  3 sets Add 5 lb weight to each hand when it gets easy  For balance and  coare strength:  Practice getting up from a chiar WITHOUT USING YOUR HANDS  10 times  3 sets  Stand on one leg for 10 seconds, near the kitchen counter to work on your balance.  Try to use only one finger on the counter 5 times each leg   3 sets    Health Maintenance for Postmenopausal Women Menopause is a normal process in which your reproductive ability comes to an end. This process happens gradually over a span of months to years, usually between the ages of 36 and 12. Menopause is complete when you have missed 12 consecutive menstrual periods. It is important to talk with your health care provider about some of the most common conditions that affect postmenopausal women, such as heart disease, cancer, and bone loss (osteoporosis). Adopting a healthy lifestyle and getting preventive care can help to promote your health and wellness. Those actions can also lower your chances of developing some of these common conditions. What should I know about menopause? During menopause, you may experience a number of symptoms, such as:  Moderate-to-severe hot flashes.  Night sweats.  Decrease in sex drive.  Mood swings.  Headaches.  Tiredness.  Irritability.  Memory problems.  Insomnia. Choosing to treat or not to treat menopausal changes is an individual decision that you make with your health care provider. What should I know about hormone replacement therapy and supplements? Hormone therapy products are effective for treating symptoms that are associated with menopause, such as hot flashes and night sweats. Hormone replacement carries certain risks, especially as you become older. If you are thinking about  using estrogen or estrogen with progestin treatments, discuss the benefits and risks with your health care provider. What should I know about heart disease and stroke? Heart disease, heart attack, and stroke become more likely as you age. This may be due, in part, to the hormonal changes that your body experiences during menopause. These can affect how your body processes dietary fats, triglycerides, and cholesterol. Heart attack and stroke are both medical emergencies. There are many things that you can do to help prevent heart disease and stroke:  Have your blood pressure checked at least every 1-2 years. High blood pressure causes heart disease and increases the risk of stroke.  If you are 30-41 years old, ask your health care provider if you should take aspirin to prevent a heart attack or a stroke.  Do not use any tobacco products, including cigarettes, chewing tobacco, or electronic cigarettes. If you need help quitting, ask your health care provider.  It is important to eat a healthy diet and maintain a healthy weight.  Be sure to include plenty of vegetables, fruits, low-fat dairy products, and lean protein.  Avoid eating foods that are high in solid fats, added sugars, or salt (sodium).  Get regular exercise. This is one of the most important things that you can do for your health.  Try to exercise for at least 150 minutes each week. The type of exercise that you do should increase your heart rate and make you sweat. This is known as moderate-intensity exercise.  Try to do strengthening exercises  at least twice each week. Do these in addition to the moderate-intensity exercise.  Know your numbers.Ask your health care provider to check your cholesterol and your blood glucose. Continue to have your blood tested as directed by your health care provider. What should I know about cancer screening? There are several types of cancer. Take the following steps to reduce your risk and to  catch any cancer development as early as possible. Breast Cancer  Practice breast self-awareness.  This means understanding how your breasts normally appear and feel.  It also means doing regular breast self-exams. Let your health care provider know about any changes, no matter how small.  If you are 32 or older, have a clinician do a breast exam (clinical breast exam or CBE) every year. Depending on your age, family history, and medical history, it may be recommended that you also have a yearly breast X-ray (mammogram).  If you have a family history of breast cancer, talk with your health care provider about genetic screening.  If you are at high risk for breast cancer, talk with your health care provider about having an MRI and a mammogram every year.  Breast cancer (BRCA) gene test is recommended for women who have family members with BRCA-related cancers. Results of the assessment will determine the need for genetic counseling and BRCA1 and for BRCA2 testing. BRCA-related cancers include these types:  Breast. This occurs in males or females.  Ovarian.  Tubal. This may also be called fallopian tube cancer.  Cancer of the abdominal or pelvic lining (peritoneal cancer).  Prostate.  Pancreatic. Cervical, Uterine, and Ovarian Cancer  Your health care provider may recommend that you be screened regularly for cancer of the pelvic organs. These include your ovaries, uterus, and vagina. This screening involves a pelvic exam, which includes checking for microscopic changes to the surface of your cervix (Pap test).  For women ages 21-65, health care providers may recommend a pelvic exam and a Pap test every three years. For women ages 21-65, they may recommend the Pap test and pelvic exam, combined with testing for human papilloma virus (HPV), every five years. Some types of HPV increase your risk of cervical cancer. Testing for HPV may also be done on women of any age who have unclear Pap  test results.  Other health care providers may not recommend any screening for nonpregnant women who are considered low risk for pelvic cancer and have no symptoms. Ask your health care provider if a screening pelvic exam is right for you.  If you have had past treatment for cervical cancer or a condition that could lead to cancer, you need Pap tests and screening for cancer for at least 20 years after your treatment. If Pap tests have been discontinued for you, your risk factors (such as having a new sexual partner) need to be reassessed to determine if you should start having screenings again. Some women have medical problems that increase the chance of getting cervical cancer. In these cases, your health care provider may recommend that you have screening and Pap tests more often.  If you have a family history of uterine cancer or ovarian cancer, talk with your health care provider about genetic screening.  If you have vaginal bleeding after reaching menopause, tell your health care provider.  There are currently no reliable tests available to screen for ovarian cancer. Lung Cancer  Lung cancer screening is recommended for adults 43-15 years old who are at high risk for lung cancer  because of a history of smoking. A yearly low-dose CT scan of the lungs is recommended if you:  Currently smoke.  Have a history of at least 30 pack-years of smoking and you currently smoke or have quit within the past 15 years. A pack-year is smoking an average of one pack of cigarettes per day for one year. Yearly screening should:  Continue until it has been 15 years since you quit.  Stop if you develop a health problem that would prevent you from having lung cancer treatment. Colorectal Cancer  This type of cancer can be detected and can often be prevented.  Routine colorectal cancer screening usually begins at age 31 and continues through age 35.  If you have risk factors for colon cancer, your health  care provider may recommend that you be screened at an earlier age.  If you have a family history of colorectal cancer, talk with your health care provider about genetic screening.  Your health care provider may also recommend using home test kits to check for hidden blood in your stool.  A small camera at the end of a tube can be used to examine your colon directly (sigmoidoscopy or colonoscopy). This is done to check for the earliest forms of colorectal cancer.  Direct examination of the colon should be repeated every 5-10 years until age 48. However, if early forms of precancerous polyps or small growths are found or if you have a family history or genetic risk for colorectal cancer, you may need to be screened more often. Skin Cancer  Check your skin from head to toe regularly.  Monitor any moles. Be sure to tell your health care provider:  About any new moles or changes in moles, especially if there is a change in a mole's shape or color.  If you have a mole that is larger than the size of a pencil eraser.  If any of your family members has a history of skin cancer, especially at a young age, talk with your health care provider about genetic screening.  Always use sunscreen. Apply sunscreen liberally and repeatedly throughout the day.  Whenever you are outside, protect yourself by wearing long sleeves, pants, a wide-brimmed hat, and sunglasses. What should I know about osteoporosis? Osteoporosis is a condition in which bone destruction happens more quickly than new bone creation. After menopause, you may be at an increased risk for osteoporosis. To help prevent osteoporosis or the bone fractures that can happen because of osteoporosis, the following is recommended:  If you are 76-87 years old, get at least 1,000 mg of calcium and at least 600 mg of vitamin D per day.  If you are older than age 62 but younger than age 40, get at least 1,200 mg of calcium and at least 600 mg of  vitamin D per day.  If you are older than age 33, get at least 1,200 mg of calcium and at least 800 mg of vitamin D per day. Smoking and excessive alcohol intake increase the risk of osteoporosis. Eat foods that are rich in calcium and vitamin D, and do weight-bearing exercises several times each week as directed by your health care provider. What should I know about how menopause affects my mental health? Depression may occur at any age, but it is more common as you become older. Common symptoms of depression include:  Low or sad mood.  Changes in sleep patterns.  Changes in appetite or eating patterns.  Feeling an overall lack of  motivation or enjoyment of activities that you previously enjoyed.  Frequent crying spells. Talk with your health care provider if you think that you are experiencing depression. What should I know about immunizations? It is important that you get and maintain your immunizations. These include:  Tetanus, diphtheria, and pertussis (Tdap) booster vaccine.  Influenza every year before the flu season begins.  Pneumonia vaccine.  Shingles vaccine. Your health care provider may also recommend other immunizations. This information is not intended to replace advice given to you by your health care provider. Make sure you discuss any questions you have with your health care provider. Document Released: 06/21/2005 Document Revised: 11/17/2015 Document Reviewed: 01/31/2015 Elsevier Interactive Patient Education  2017 [employer].

## 2016-09-02 NOTE — Progress Notes (Signed)
Patient ID: Teresa Lin, female    DOB: 03-21-1932  Age: 83 y.o. MRN: [id number]  The patient is here for  management of chronic and acute problems.   MAMMOGRAM June 2017 DEXA AT [facility] 2017   No change since 2011  TAKING FOSAMAX again for the past 6 months  Less than 5 years FOR OSTEOPOROSIS.  HAS LOW BACK PAIN AFTER SITTING   No labs since June other than urine   The risk factors are reflected in the social history.  The roster of all physicians providing medical care to patient - is listed in the Snapshot section of the chart.  Activities of daily living:  The patient is 100% independent in all ADLs: dressing, toileting, feeding as well as independent mobility  Home safety : The patient has smoke detectors in the home. They wear seatbelts.  There are no firearms at home. There is no violence in the home.   There is no risks for hepatitis, STDs or HIV. There is no   history of blood transfusion. They have no travel history to infectious disease endemic areas of the world.  The patient has seen their dentist in the last six month. They have seen their eye doctor in the last year. They admit to slight hearing difficulty with regard to whispered voices and some television programs.  They have deferred audiologic testing in the last year.  They do not  have excessive sun exposure. Discussed the need for sun protection: hats, long sleeves and use of sunscreen if there is significant sun exposure.   Diet: the importance of a healthy diet is discussed. They do have a healthy diet.  The benefits of regular aerobic exercise were discussed. She walks 4 times per week ,  20 minutes.   Depression screen: there are no signs or vegative symptoms of depression- irritability, change in appetite, anhedonia, sadness/tearfullness.  Cognitive assessment: the patient manages all their financial and personal affairs and is actively engaged. They could relate day,date,year and events; recalled 2/3 objects  at 3 minutes; performed clock-face test normally.  The following portions of the patient's history were reviewed and updated as appropriate: allergies, current medications, past family history, past medical history,  past surgical history, past social history  and problem list.  Visual acuity was not assessed per patient preference since she has regular follow up with her ophthalmologist. Hearing and body mass index were assessed and reviewed.   During the course of the visit the patient was educated and counseled about appropriate screening and preventive services including : fall prevention , diabetes screening, nutrition counseling, colorectal cancer screening, and recommended immunizations.    CC: The primary encounter diagnosis was Breast cancer screening. 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   Using melatonin 1.5 mg for insomnia,  Early to bed.  Wearing compression stockings  Due to history of venous stasis ulcer Arthritis in her feet sees podiatry    HAS NOT FASTED TODAY   CHOLESTEROL IS FINE .  WANTS TO AVOID STATINS AT ALL COSTS.   History Teresa Lin has a past medical history of Arthritis; Collagen vascular disease (Teresa Lin); Colon polyps; and Phlebitis and thrombophlebitis of deep veins of lower extremities (Teresa Lin).   She has a past surgical history that includes Appendectomy and Joint replacement (Bilateral).   Her  family history includes Cancer in her mother; Heart attack in her father, mother, and sister; Hypertension in her father, mother, and sister; Multiple sclerosis in her daughter, son, and son.She reports that she has never smoked. She has never used smokeless tobacco. She reports that she drinks about 1.2 oz of  alcohol per week . She reports that she does not use drugs.  Outpatient Medications Prior to Visit  Medication Sig Dispense Refill  . alendronate (FOSAMAX) 70 MG tablet Take by mouth. Reported on 11/13/2015    . aspirin 81 MG tablet Take 81 mg by mouth daily.    . calcium-vitamin D (OSCAL WITH D) 500-200 MG-UNIT per tablet Take 1 tablet by mouth. Reported on 07/15/3297    . folic acid (FOLVITE) 1 MG tablet Take 1 mg by mouth daily.     . hydroxychloroquine (PLAQUENIL) 200 MG tablet Take 200 mg by mouth daily.    Marland Kitchen ibuprofen (ADVIL,MOTRIN) 200 MG tablet Take 200 mg by mouth every 6 (six) hours as needed.    . Lactobacillus Rhamnosus, GG, (CULTURELLE PO) Take 1 tablet by mouth daily. Probiotic    . MELATONIN PO Take 1.5 tablets by mouth at bedtime.    . methotrexate 50 MG/2ML injection Inject into the skin once a week.     . Multiple Vitamins-Minerals (MULTIVITAMIN WITH MINERALS) tablet Take 1 tablet by mouth daily.     No facility-administered medications prior to visit.     Review of Systems   Patient denies headache, fevers, malaise, unintentional weight loss, skin rash, eye pain, sinus congestion and sinus pain, sore throat, dysphagia,  hemoptysis , cough, dyspnea, wheezing, chest pain, palpitations, orthopnea, edema, abdominal pain, nausea, melena, diarrhea, constipation, flank pain, dysuria, hematuria, urinary  Frequency, nocturia, numbness, tingling, seizures,  Focal weakness, Loss of consciousness,  Tremor, insomnia, depression, anxiety, and suicidal ideation.     Objective:  BP 116/64   Pulse 74   Temp 97.9 F (36.6 C) (Oral)   Resp 15   Ht 5' 5.5" (1.664 m)   Wt 141 lb 3.2 oz (64 kg)   SpO2 97%   BMI 23.14 kg/m   Physical Exam   General appearance: alert, cooperative and appears stated age Head: Normocephalic, without obvious abnormality, atraumatic Eyes: conjunctivae/corneas clear. PERRL, EOM's intact. Fundi benign. Ears: normal TM's and external ear canals both  ears Nose: Nares normal. Septum midline. Mucosa normal. No drainage or sinus tenderness. Throat: lips, mucosa, and tongue normal; teeth and gums normal Neck: no adenopathy, no carotid bruit, no JVD, supple, symmetrical, trachea midline and thyroid not enlarged, symmetric, no tenderness/mass/nodules Lungs: clear to auscultation bilaterally Breasts: normal appearance, no masses or tenderness Heart: regular rate and rhythm, S1, S2 normal, no murmur, click, rub or gallop Abdomen: soft, non-tender; bowel sounds normal; no masses,  no organomegaly Extremities: extremities normal, atraumatic, no cyanosis or edema Pulses: 2+ and symmetric Skin: Skin color, texture, turgor normal. No rashes or lesions Neurologic: Alert and oriented X 3, normal strength and tone. Normal symmetric reflexes. Normal coordination and gait.     Assessment & Plan:   Problem List Items Addressed This Visit    Atherosclerosis of arteries    She prefers to avoid statin therapy   Lab Results  Component Value Date   CHOL 181 08/28/2015   HDL 62.90 08/28/2015   LDLCALC 102 (H) 08/28/2015   TRIG 83.0 08/28/2015   CHOLHDL 3 08/28/2015   .      Relevant Orders   Lipid panel  B12 deficiency    Lab Results  Component Value Date   PRFFMBWG66 599 08/28/2015   Last level was normal,  Continue dietary supplements      Breast cancer screening - Primary    Mammogram ordered      Relevant Orders   MM SCREENING BREAST TOMO BILATERAL   Chest pain    Improving , involving the Left lateral chest wall,  Secondary to fall.  Unilateral rib films were doneto rule out fractures of ribs and were negative.       Frequent falls    Due to cerebral atrophy and vertigo.  Her last fall occurred in early March and there were no fractures. .  She is being more careful, and her house has all the apprpriate safety features.  She is wearing a Designer, fashion/clothing      Insomnia    Managed with melatonin       Osteoporosis,  postmenopausal    Reviewed her medications,  Renal function,  Symptoms and the risks and benefits of alendronate,  And the DEXA repeated after vertebral fracture which was done at [facility]. I see no reason to stop the medication that Dr Jefm Bryant has initiated.   Lab Results  Component Value Date   CREATININE 0.61 11/10/2015         Relevant Orders   Comprehensive metabolic panel   Rheumatoid arthritis (Teresa Lin)    Symptoms and disease are managed by Dr. Jefm Bryant with Placquenil and methotrexate. 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 I am having Ms. Ruesch maintain her folic acid, hydroxychloroquine, aspirin, calcium-vitamin D, multivitamin with minerals, methotrexate, alendronate, (Lactobacillus Rhamnosus, GG, (CULTURELLE PO)), ibuprofen, and MELATONIN PO.  No orders of the defined types were placed in this encounter.   There are no discontinued medications.  Follow-up: No Follow-up on file.   Crecencio Mc, MD

## 2016-09-05 NOTE — Assessment & Plan Note (Signed)
Reviewed her medications,  Renal function,  Symptoms and the risks and benefits of alendronate,  And the DEXA repeated after vertebral fracture which was done at [facility] clinic. I see no reason to stop the medication that Dr Jefm Bryant has initiated.   Lab Results  Component Value Date   CREATININE 0.61 11/10/2015

## 2016-09-05 NOTE — Assessment & Plan Note (Signed)
She had moderate atrophy by MRI Sept 2017

## 2016-09-05 NOTE — Assessment & Plan Note (Signed)
Lab Results  Component Value Date   VITAMINB12 289 08/28/2015   Last level was normal,  Continue dietary supplements

## 2016-09-05 NOTE — Assessment & Plan Note (Addendum)
Due to cerebral atrophy and vertigo.  Her last fall occurred in early March and there were no fractures. .  She is being more careful, and her house has all the apprpriate safety features.  She is wearing a Designer, fashion/clothing

## 2016-09-05 NOTE — Assessment & Plan Note (Signed)
Symptoms and disease are managed by Dr. Jefm Bryant with Placquenil and methotrexate. Liver enzymes and cr were normal in Jan 2018

## 2016-09-05 NOTE — Assessment & Plan Note (Signed)
Improving , involving the Left lateral chest wall,  Secondary to fall.  Unilateral rib films were doneto rule out fractures of ribs and were negative.

## 2016-09-05 NOTE — Assessment & Plan Note (Signed)
She prefers to avoid statin therapy   Lab Results  Component Value Date   CHOL 181 08/28/2015   HDL 62.90 08/28/2015   LDLCALC 102 (H) 08/28/2015   TRIG 83.0 08/28/2015   CHOLHDL 3 08/28/2015   .

## 2016-09-05 NOTE — Assessment & Plan Note (Signed)
Managed with melatonin

## 2016-09-05 NOTE — Assessment & Plan Note (Signed)
Mammogram ordered

## 2016-09-12 ENCOUNTER — Encounter: Payer: Self-pay | Admitting: Family

## 2016-09-12 ENCOUNTER — Ambulatory Visit (INDEPENDENT_AMBULATORY_CARE_PROVIDER_SITE_OTHER): Payer: Medicare Other | Admitting: Family

## 2016-09-12 ENCOUNTER — Ambulatory Visit (INDEPENDENT_AMBULATORY_CARE_PROVIDER_SITE_OTHER): Payer: Medicare Other

## 2016-09-12 VITALS — BP 126/68 | HR 78 | Temp 98.0°F | Ht 65.5 in | Wt 140.0 lb

## 2016-09-12 DIAGNOSIS — J40 Bronchitis, not specified as acute or chronic: Secondary | ICD-10-CM

## 2016-09-12 DIAGNOSIS — R05 Cough: Secondary | ICD-10-CM | POA: Diagnosis not present

## 2016-09-12 MED ORDER — BENZONATATE 100 MG PO CAPS: 100.0000 mg | ORAL_CAPSULE | Freq: Two times a day (BID) | ORAL | 0 refills | Status: DC | PRN

## 2016-09-12 MED ORDER — CEFDINIR 300 MG PO CAPS: 300.0000 mg | ORAL_CAPSULE | Freq: Two times a day (BID) | ORAL | 0 refills | Status: DC

## 2016-09-12 NOTE — Patient Instructions (Signed)
I suspect that your infection is viral in nature.  As discussed, I advise that you wait to fill the antibiotic after 1-2 days of symptom management to see if your symptoms improve. If you do not show improvement, you may take the antibiotic as prescribed.   Be sure to just get the plain Mucinex  (Guaifenesen). We don't recommend the combination products. Note, be sure to drink two glasses of water with each dose of Mucinex as the medication will not work well without adequate hydration.   Increase intake of clear fluids. Congestion is best treated by hydration, when mucus is wetter, it is thinner, less sticky, and easier to expel from the body, either through coughing up drainage, or by blowing your nose.   Get plenty of rest.   Use saline nasal drops and blow your nose frequently. Run a humidifier at night and elevate the head of the bed. Vicks Vapor rub will help with congestion and cough. Steam showers and sinus massage for congestion.   Use Acetaminophen or Ibuprofen as needed for fever or pain. Avoid second hand smoke. Even the smallest exposure will worsen symptoms.     You can also try a teaspoon of honey to see if this will help reduce cough. Throat lozenges can sometimes be beneficial as well.    This illness will typically last 7 - 10 days.   Please follow up with our clinic if you develop a fever greater than 101 F, symptoms worsen, or do not resolve in the next week.

## 2016-09-12 NOTE — Progress Notes (Signed)
Pre visit review using our clinic review tool, if applicable. No additional management support is needed unless otherwise documented below in the visit note. 

## 2016-09-12 NOTE — Progress Notes (Signed)
Subjective:    Patient ID: Teresa Lin, female    DOB: April 21, 1932, 83 y.o.   MRN: [id number]  CC: Teresa Lin is a 83 y.o. female who presents today for an acute visit.    HPI: CC: cough, sinus congestion x 4 days, slightly better today  Endorses chills, fatigue, clear nasal congestion. NO fever, SOB, wheezing , HA.   Hasn't taken methotrexate this week.          HISTORY:  Past Medical History:  Diagnosis Date  . Arthritis   . Collagen vascular disease ([place])   . Colon polyps   . Phlebitis and thrombophlebitis of deep veins of lower extremities ([place])    after surgery   Past Surgical History:  Procedure Laterality Date  . APPENDECTOMY    . JOINT REPLACEMENT Bilateral    knee replacement   Family History  Problem Relation Age of Onset  . Hypertension Mother   . Cancer Mother     colon ca  . Heart attack Mother   . Hypertension Father   . Heart attack Father   . Hypertension Sister   . Heart attack Sister   . Multiple sclerosis Daughter   . Multiple sclerosis Son   . Multiple sclerosis Son     Allergies: Infliximab and Synvisc [hylan g-f 20] Current Outpatient Prescriptions on File Prior to Visit  Medication Sig Dispense Refill  . alendronate (FOSAMAX) 70 MG tablet Take by mouth. Reported on 11/13/2015    . aspirin 81 MG tablet Take 81 mg by mouth daily.    . calcium-vitamin D (OSCAL WITH D) 500-200 MG-UNIT per tablet Take 1 tablet by mouth. Reported on 07/15/3612    . folic acid (FOLVITE) 1 MG tablet Take 1 mg by mouth daily.     . hydroxychloroquine (PLAQUENIL) 200 MG tablet Take 200 mg by mouth daily.    Marland Kitchen ibuprofen (ADVIL,MOTRIN) 200 MG tablet Take 200 mg by mouth every 6 (six) hours as needed.    . Lactobacillus Rhamnosus, GG, (CULTURELLE PO) Take 1 tablet by mouth daily. Probiotic    . MELATONIN PO Take 1.5 tablets by mouth at bedtime.    . methotrexate 50 MG/2ML injection Inject into the skin once a week.     . Multiple Vitamins-Minerals (MULTIVITAMIN  WITH MINERALS) tablet Take 1 tablet by mouth daily.     No current facility-administered medications on file prior to visit.     Social History  Substance Use Topics  . Smoking status: Never Smoker  . Smokeless tobacco: Never Used  . Alcohol use 1.2 oz/week    2 Cans of beer per week     Comment: occasionally     Review of Systems  Constitutional: Positive for chills and fatigue. Negative for fever.  HENT: Positive for congestion and ear pain (right). Negative for sinus pain and sinus pressure.   Respiratory: Positive for cough. Negative for shortness of breath and wheezing.   Cardiovascular: Negative for chest pain and palpitations.  Gastrointestinal: Negative for nausea and vomiting.      Objective:    BP 126/68   Pulse 78   Temp 98 F (36.7 C) (Oral)   Ht 5' 5.5" (1.664 m)   Wt 140 lb (63.5 kg)   SpO2 99%   BMI 22.94 kg/m   BP Readings from Last 3 Encounters:  09/12/16 126/68  09/02/16 116/64  08/30/16 110/70    Physical Exam  Constitutional: She appears well-developed and well-nourished.  HENT:  Head:  Normocephalic and atraumatic.  Right Ear: Hearing, tympanic membrane, external ear and ear canal normal. No drainage, swelling or tenderness. No foreign bodies. Tympanic membrane is not erythematous and not bulging. No middle ear effusion. No decreased hearing is noted.  Left Ear: Hearing, tympanic membrane, external ear and ear canal normal. No drainage, swelling or tenderness. No foreign bodies. Tympanic membrane is not erythematous and not bulging.  No middle ear effusion. No decreased hearing is noted.  Nose: Rhinorrhea present. Right sinus exhibits no maxillary sinus tenderness and no frontal sinus tenderness. Left sinus exhibits no maxillary sinus tenderness and no frontal sinus tenderness.  Mouth/Throat: Uvula is midline, oropharynx is clear and moist and mucous membranes are normal. No oropharyngeal exudate, posterior oropharyngeal edema, posterior  oropharyngeal erythema or tonsillar abscesses.  Eyes: Conjunctivae are normal.  Cardiovascular: Regular rhythm, normal heart sounds and normal pulses.   Pulmonary/Chest: Effort normal and breath sounds normal. She has no wheezes. She has no rhonchi. She has no rales.  Lymphadenopathy:       Head (right side): No submental, no submandibular, no tonsillar, no preauricular, no posterior auricular and no occipital adenopathy present.       Head (left side): No submental, no submandibular, no tonsillar, no preauricular, no posterior auricular and no occipital adenopathy present.    She has no cervical adenopathy.  Neurological: She is alert.  Skin: Skin is warm and dry.  Psychiatric: She has a normal mood and affect. Her speech is normal and behavior is normal. Thought content normal.  Vitals reviewed.      Assessment & Plan:   1. Bronchitis Working diagnosis of viral URI x 4 days. No acute respiratory distress. Afebrile. Patient and I agreed upon conservative therapy at this time with symptom management, close observation, and delayed antibiotic treatment. Given Rx for omnicef if symptoms were not to continue to improve. Pending cxr to ensure no underlying bacterial infection.   - benzonatate (TESSALON) 100 MG capsule; Take 1 capsule (100 mg total) by mouth 2 (two) times daily as needed for cough.  Dispense: 20 capsule; Refill: 0 - DG Chest 2 View - cefdinir (OMNICEF) 300 MG capsule; Take 1 capsule (300 mg total) by mouth 2 (two) times daily.  Dispense: 20 capsule; Refill: 0    I am having Ms. Spagnolo maintain her folic acid, hydroxychloroquine, aspirin, calcium-vitamin D, multivitamin with minerals, methotrexate, alendronate, (Lactobacillus Rhamnosus, GG, (CULTURELLE PO)), ibuprofen, and MELATONIN PO.   No orders of the defined types were placed in this encounter.   Return precautions given.   Risks, benefits, and alternatives of the medications and treatment plan prescribed today were  discussed, and patient expressed understanding.   Education regarding symptom management and diagnosis given to patient on AVS.  Continue to follow with TULLO, Aris Everts, MD for routine health maintenance.   Lowella Grip and I agreed with plan.   Mable Paris, FNP

## 2016-09-20 ENCOUNTER — Telehealth: Payer: Self-pay | Admitting: Internal Medicine

## 2016-09-20 DIAGNOSIS — E785 Hyperlipidemia, unspecified: Secondary | ICD-10-CM | POA: Diagnosis not present

## 2016-09-20 DIAGNOSIS — Z79899 Other long term (current) drug therapy: Secondary | ICD-10-CM | POA: Diagnosis not present

## 2016-09-20 DIAGNOSIS — E559 Vitamin D deficiency, unspecified: Secondary | ICD-10-CM | POA: Diagnosis not present

## 2016-09-20 DIAGNOSIS — R5383 Other fatigue: Secondary | ICD-10-CM | POA: Diagnosis not present

## 2016-09-20 DIAGNOSIS — M0609 Rheumatoid arthritis without rheumatoid factor, multiple sites: Secondary | ICD-10-CM | POA: Diagnosis not present

## 2016-09-20 LAB — BASIC METABOLIC PANEL
BUN: 13 mg/dL (ref 4–21)
Glucose: 93 mg/dL
Potassium: 4.8 mmol/L (ref 3.4–5.3)
Sodium: 143 mmol/L (ref 137–147)

## 2016-09-20 LAB — TSH: TSH: 2.31 u[IU]/mL (ref 0.41–5.90)

## 2016-09-20 LAB — CBC AND DIFFERENTIAL
HCT: 42 % (ref 36–46)
Hemoglobin: 14 g/dL (ref 12.0–16.0)
Platelets: 307 10*3/uL (ref 150–399)
WBC: 5.2 10^3/mL

## 2016-09-20 LAB — HEPATIC FUNCTION PANEL
ALT: 14 U/L (ref 7–35)
AST: 20 U/L (ref 13–35)
Alkaline Phosphatase: 43 U/L (ref 25–125)
Bilirubin, Total: 0.4 mg/dL

## 2016-09-20 LAB — VITAMIN D 25 HYDROXY (VIT D DEFICIENCY, FRACTURES): Vit D, 25-Hydroxy: 43.8

## 2016-09-20 NOTE — Telephone Encounter (Signed)
Pt called and stated that she saw M. Arnett on 5/3. Pt states that she is still not feeling great. She states that she is feeling tired and run down. She is having a dry cough. Please advise, thank you!  Call pt @ [phone]

## 2016-09-23 NOTE — Telephone Encounter (Signed)
Her labs are completely normal. Nothing to suggest infection or underactive thyroid, no anemia.  Etc. Chest x ray was normal may 3,  The dry cough may be due to the pollen,  Post nasal drip,  reflux, or allergies .   Try:  Wearing a mask if she spends time in the yard  Until  the pollen count improves taaking 25 mg benadryl at bedtime for post nasal drip  Omeprazole 20 mg daily in the am (OTC)   At 84,  takign a nap in the afternoon is permitted,  Limit it to 30 minutes

## 2016-09-23 NOTE — Telephone Encounter (Signed)
Reason for call: Symptoms: still feeling tired late in day , dry cough, no nasal congestion,  Nasal drip clear drainag no chest congestion, cold sweat in church yesterday in air conditioner ,  Duration Medications: Mucinex DM, stopped Methotrexate for two weeks due to infection Last seen for this problem: seen 5/3 Seen by: Blood work done at [facility] Friday would like for you to review

## 2016-09-23 NOTE — Telephone Encounter (Signed)
Left voice mail to call back 

## 2016-09-24 NOTE — Telephone Encounter (Signed)
Patient advised of below and verbalized understanding.  

## 2016-09-26 ENCOUNTER — Encounter: Payer: Self-pay | Admitting: Internal Medicine

## 2016-09-27 DIAGNOSIS — E7800 Pure hypercholesterolemia, unspecified: Secondary | ICD-10-CM | POA: Diagnosis not present

## 2016-09-27 DIAGNOSIS — R079 Chest pain, unspecified: Secondary | ICD-10-CM | POA: Diagnosis not present

## 2016-09-27 DIAGNOSIS — I82890 Acute embolism and thrombosis of other specified veins: Secondary | ICD-10-CM | POA: Diagnosis not present

## 2016-09-27 DIAGNOSIS — I10 Essential (primary) hypertension: Secondary | ICD-10-CM | POA: Diagnosis not present

## 2016-09-30 ENCOUNTER — Telehealth: Payer: Self-pay | Admitting: Internal Medicine

## 2016-09-30 NOTE — Telephone Encounter (Signed)
Your CBC, vitamin D, thyroid , liver and kidney function from a [facility] May 11 draw were  normal.  Cholesterol was not done but was normal last year . Please Plan to repeat fasting labs in 6 months  Please abstract .   Thanks

## 2016-09-30 NOTE — Telephone Encounter (Signed)
LMTCB

## 2016-10-01 NOTE — Telephone Encounter (Signed)
Spoke with pt and informed her of her lab results. Pt gave a verbal understanding.  

## 2016-10-02 ENCOUNTER — Other Ambulatory Visit: Payer: Self-pay

## 2016-10-21 ENCOUNTER — Ambulatory Visit
Admission: RE | Admit: 2016-10-21 | Discharge: 2016-10-21 | Disposition: A | Discharge status: Home / Self Care | Payer: Medicare Other | Source: Ambulatory Visit | Attending: Internal Medicine | Admitting: Internal Medicine

## 2016-10-21 DIAGNOSIS — Z1239 Encounter for other screening for malignant neoplasm of breast: Secondary | ICD-10-CM

## 2016-10-21 DIAGNOSIS — Z1231 Encounter for screening mammogram for malignant neoplasm of breast: Secondary | ICD-10-CM | POA: Insufficient documentation

## 2016-10-30 DIAGNOSIS — M205X2 Other deformities of toe(s) (acquired), left foot: Secondary | ICD-10-CM | POA: Diagnosis not present

## 2016-10-30 DIAGNOSIS — M898X9 Other specified disorders of bone, unspecified site: Secondary | ICD-10-CM | POA: Diagnosis not present

## 2016-10-30 DIAGNOSIS — M2012 Hallux valgus (acquired), left foot: Secondary | ICD-10-CM | POA: Diagnosis not present

## 2016-10-31 DIAGNOSIS — R4781 Slurred speech: Secondary | ICD-10-CM | POA: Diagnosis not present

## 2016-10-31 DIAGNOSIS — R9082 White matter disease, unspecified: Secondary | ICD-10-CM | POA: Diagnosis not present

## 2016-10-31 DIAGNOSIS — R42 Dizziness and giddiness: Secondary | ICD-10-CM | POA: Diagnosis not present

## 2016-10-31 DIAGNOSIS — G4700 Insomnia, unspecified: Secondary | ICD-10-CM | POA: Diagnosis not present

## 2016-10-31 DIAGNOSIS — G3184 Mild cognitive impairment, so stated: Secondary | ICD-10-CM | POA: Diagnosis not present

## 2016-11-07 DIAGNOSIS — H04129 Dry eye syndrome of unspecified lacrimal gland: Secondary | ICD-10-CM | POA: Diagnosis not present

## 2016-11-07 DIAGNOSIS — Z961 Presence of intraocular lens: Secondary | ICD-10-CM | POA: Diagnosis not present

## 2016-11-07 DIAGNOSIS — Z79899 Other long term (current) drug therapy: Secondary | ICD-10-CM | POA: Diagnosis not present

## 2016-12-16 DIAGNOSIS — Z79899 Other long term (current) drug therapy: Secondary | ICD-10-CM | POA: Diagnosis not present

## 2016-12-16 DIAGNOSIS — M0609 Rheumatoid arthritis without rheumatoid factor, multiple sites: Secondary | ICD-10-CM | POA: Diagnosis not present

## 2016-12-25 DIAGNOSIS — M79641 Pain in right hand: Secondary | ICD-10-CM | POA: Diagnosis not present

## 2016-12-25 DIAGNOSIS — Z79899 Other long term (current) drug therapy: Secondary | ICD-10-CM | POA: Diagnosis not present

## 2016-12-25 DIAGNOSIS — M8000XS Age-related osteoporosis with current pathological fracture, unspecified site, sequela: Secondary | ICD-10-CM | POA: Diagnosis not present

## 2016-12-25 DIAGNOSIS — M0609 Rheumatoid arthritis without rheumatoid factor, multiple sites: Secondary | ICD-10-CM | POA: Diagnosis not present

## 2016-12-25 DIAGNOSIS — M79642 Pain in left hand: Secondary | ICD-10-CM | POA: Diagnosis not present

## 2017-01-29 DIAGNOSIS — E785 Hyperlipidemia, unspecified: Secondary | ICD-10-CM | POA: Diagnosis not present

## 2017-01-29 DIAGNOSIS — I10 Essential (primary) hypertension: Secondary | ICD-10-CM | POA: Diagnosis not present

## 2017-01-29 DIAGNOSIS — R0789 Other chest pain: Secondary | ICD-10-CM | POA: Diagnosis not present

## 2017-02-24 ENCOUNTER — Telehealth: Payer: Self-pay | Admitting: Internal Medicine

## 2017-02-24 ENCOUNTER — Ambulatory Visit (INDEPENDENT_AMBULATORY_CARE_PROVIDER_SITE_OTHER)
Admission: RE | Admit: 2017-02-24 | Discharge: 2017-02-24 | Disposition: A | Discharge status: Home / Self Care | Payer: Medicare Other | Source: Ambulatory Visit | Attending: Internal Medicine | Admitting: Internal Medicine

## 2017-02-24 ENCOUNTER — Ambulatory Visit (INDEPENDENT_AMBULATORY_CARE_PROVIDER_SITE_OTHER): Payer: Medicare Other | Admitting: Internal Medicine

## 2017-02-24 ENCOUNTER — Encounter: Payer: Self-pay | Admitting: Internal Medicine

## 2017-02-24 VITALS — BP 124/76 | HR 84 | Temp 97.7°F | Wt 141.0 lb

## 2017-02-24 DIAGNOSIS — W19XXXA Unspecified fall, initial encounter: Secondary | ICD-10-CM

## 2017-02-24 DIAGNOSIS — Y92009 Unspecified place in unspecified non-institutional (private) residence as the place of occurrence of the external cause: Secondary | ICD-10-CM

## 2017-02-24 DIAGNOSIS — R0781 Pleurodynia: Secondary | ICD-10-CM

## 2017-02-24 NOTE — Progress Notes (Signed)
Subjective:    Patient ID: Teresa Lin, female    DOB: 08-31-1931, 83 y.o.   MRN: [id number]  HPI  Pt presents to the clinic today with c/o right side pain s/p a fall that occurred 5 days ago. She reports she slipped and fell in the tub, landing on her right side. She describes the pain as throbbing. The area is tender to touch. The pain is worse when she lays on her right side. She is having difficulty standing up due to the pain. She has pain with coughing but not with taking a deep breath. She does have osteoporosis. She has not taken anything OTC for her symptoms.  Review of Systems  Past Medical History:  Diagnosis Date  . Arthritis   . Collagen vascular disease ([place])   . Colon polyps   . Phlebitis and thrombophlebitis of deep veins of lower extremities ([facility])    after surgery    Current Outpatient Prescriptions  Medication Sig Dispense Refill  . alendronate (FOSAMAX) 70 MG tablet Take by mouth. Reported on 11/13/2015    . aspirin 81 MG tablet Take 81 mg by mouth daily.    . benzonatate (TESSALON) 100 MG capsule Take 1 capsule (100 mg total) by mouth 2 (two) times daily as needed for cough. 20 capsule 0  . calcium-vitamin D (OSCAL WITH D) 500-200 MG-UNIT per tablet Take 1 tablet by mouth. Reported on 11/13/2015    . cefdinir (OMNICEF) 300 MG capsule Take 1 capsule (300 mg total) by mouth 2 (two) times daily. 20 capsule 0  . folic acid (FOLVITE) 1 MG tablet Take 1 mg by mouth daily.     . hydroxychloroquine (PLAQUENIL) 200 MG tablet Take 200 mg by mouth daily.    Marland Kitchen ibuprofen (ADVIL,MOTRIN) 200 MG tablet Take 200 mg by mouth every 6 (six) hours as needed.    . Lactobacillus Rhamnosus, GG, (CULTURELLE PO) Take 1 tablet by mouth daily. Probiotic    . MELATONIN PO Take 1.5 tablets by mouth at bedtime.    . methotrexate 50 MG/2ML injection Inject into the skin once a week.     . Multiple Vitamins-Minerals (MULTIVITAMIN WITH MINERALS) tablet Take 1 tablet by mouth daily.     No current  facility-administered medications for this visit.     Allergies  Allergen Reactions  . Infliximab Other (See Comments)    Shaking, pain  . Synvisc [Hylan G-F 20] Swelling    Family History  Problem Relation Age of Onset  . Hypertension Mother   . Cancer Mother        colon ca  . Heart attack Mother   . Hypertension Father   . Heart attack Father   . Hypertension Sister   . Heart attack Sister   . Multiple sclerosis Daughter   . Multiple sclerosis Son   . Multiple sclerosis Son     Social History   Social History  . Marital status: Widowed    Spouse name: N/A  . Number of children: N/A  . Years of education: N/A   Occupational History  . Not on file.   Social History Main Topics  . Smoking status: Never Smoker  . Smokeless tobacco: Never Used  . Alcohol use 1.2 oz/week    2 Cans of beer per week     Comment: occasionally   . Drug use: No  . Sexual activity: No   Other Topics Concern  . Not on file   Social History Narrative  .  No narrative on file     Constitutional: Denies fever, malaise, fatigue, headache or abrupt weight changes.  Gastrointestinal: Denies abdominal pain, bloating, constipation, diarrhea or blood in the stool.  Musculoskeletal: Pt reports side chest wall pain. Denies decrease in range of motion, difficulty with gait, or joint swelling.   No other specific complaints in a complete review of systems (except as listed in HPI above).     Objective:   Physical Exam  BP 124/76   Pulse 84   Temp 97.7 F (36.5 C) (Oral)   Wt 141 lb (64 kg)   SpO2 98%   BMI 23.11 kg/m  Wt Readings from Last 3 Encounters:  02/24/17 141 lb (64 kg)  09/12/16 140 lb (63.5 kg)  09/02/16 141 lb 3.2 oz (64 kg)    General: Appears her stated age, in NAD. Skin: Warm, dry and intact. No bruising noted over the right lateral ribs. Pulmonary/Chest: Normal effort and positive vesicular breath sounds. No respiratory distress. No wheezes, rales or ronchi noted.   Abdomen: Soft and nontender.  Musculoskeletal: pain with palpation over the right lateral ribs.   BMET    Component Value Date/Time   NA 143 09/20/2016   NA 144 08/08/2011 1153   K 4.8 09/20/2016   K 4.1 08/08/2011 1153   CL 99 (L) 11/10/2015 1141   CL 106 08/08/2011 1153   CO2 27 11/10/2015 1141   CO2 26 08/08/2011 1153   GLUCOSE 118 (H) 11/10/2015 1141   GLUCOSE 92 08/08/2011 1153   BUN 13 09/20/2016   BUN 12 08/08/2011 1153   CREATININE 0.6 06/06/2016   CREATININE 0.61 11/10/2015 1141   CREATININE 0.59 (L) 08/08/2011 1153   CALCIUM 8.7 (L) 11/10/2015 1141   CALCIUM 9.0 08/08/2011 1153   GFRNONAA >60 11/10/2015 1141   GFRNONAA >60 08/08/2011 1153   GFRAA >60 11/10/2015 1141   GFRAA >60 08/08/2011 1153    Lipid Panel     Component Value Date/Time   CHOL 181 08/28/2015 0807   TRIG 83.0 08/28/2015 0807   HDL 62.90 08/28/2015 0807   CHOLHDL 3 08/28/2015 0807   VLDL 16.6 08/28/2015 0807   LDLCALC 102 (H) 08/28/2015 0807    CBC    Component Value Date/Time   WBC 5.2 09/20/2016   WBC 5.7 11/10/2015 1141   RBC 4.15 11/10/2015 1141   HGB 14.0 09/20/2016   HGB 13.6 08/08/2011 1153   HCT 42 09/20/2016   HCT 40.8 08/08/2011 1153   PLT 307 09/20/2016   PLT 264 08/08/2011 1153   MCV 95.2 11/10/2015 1141   MCV 96 08/08/2011 1153   MCH 32.2 11/10/2015 1141   MCHC 33.8 11/10/2015 1141   RDW 13.9 11/10/2015 1141   RDW 14.4 08/08/2011 1153   LYMPHSABS 1.2 08/28/2015 0807   MONOABS 0.5 08/28/2015 0807   EOSABS 0.2 08/28/2015 0807   BASOSABS 0.0 08/28/2015 0807    Hgb A1C Lab Results  Component Value Date   HGBA1C 5.6 09/05/2014            Assessment & Plan:   Right Side Rib Pain, s/p Fall:  Discussed how obtaining an xray will not change the treatment, but she insists on xray today Discussed using a heating pad and NSAID's for comfort  Will follow up after xray, return precautions discussed Webb Silversmith, NP

## 2017-02-24 NOTE — Patient Instructions (Signed)

## 2017-02-24 NOTE — Telephone Encounter (Signed)
Pt called and stated that she fell in the bathtub on Thursday and fell on her right side. She thinks that she may have a broken rib. She cannot stand up straight. She wants to come in for an x-ray. Please advise, thank you!  Call pt @ [phone] or [phone] (cell)

## 2017-02-24 NOTE — Telephone Encounter (Signed)
Patient has been scheduled with NP at [facility] office due to no openings available at our office

## 2017-02-26 DIAGNOSIS — H903 Sensorineural hearing loss, bilateral: Secondary | ICD-10-CM | POA: Diagnosis not present

## 2017-02-28 DIAGNOSIS — S299XXA Unspecified injury of thorax, initial encounter: Secondary | ICD-10-CM | POA: Diagnosis not present

## 2017-02-28 DIAGNOSIS — R0781 Pleurodynia: Secondary | ICD-10-CM | POA: Diagnosis not present

## 2017-02-28 DIAGNOSIS — S20211A Contusion of right front wall of thorax, initial encounter: Secondary | ICD-10-CM | POA: Diagnosis not present

## 2017-03-05 ENCOUNTER — Encounter: Payer: Self-pay | Admitting: Internal Medicine

## 2017-03-05 ENCOUNTER — Ambulatory Visit (INDEPENDENT_AMBULATORY_CARE_PROVIDER_SITE_OTHER): Payer: Medicare Other | Admitting: Internal Medicine

## 2017-03-05 DIAGNOSIS — Z23 Encounter for immunization: Secondary | ICD-10-CM | POA: Diagnosis not present

## 2017-03-05 DIAGNOSIS — G4700 Insomnia, unspecified: Secondary | ICD-10-CM

## 2017-03-05 DIAGNOSIS — I8393 Asymptomatic varicose veins of bilateral lower extremities: Secondary | ICD-10-CM

## 2017-03-05 DIAGNOSIS — S2231XD Fracture of one rib, right side, subsequent encounter for fracture with routine healing: Secondary | ICD-10-CM | POA: Diagnosis not present

## 2017-03-05 DIAGNOSIS — R296 Repeated falls: Secondary | ICD-10-CM

## 2017-03-05 NOTE — Progress Notes (Signed)
Subjective:  Patient ID: Teresa Lin, female    DOB: 02-Feb-1932  Age: 83 y.o. MRN: [id number]  CC: Diagnoses of Encounter for immunization, Frequent falls, Fracture of one rib, right side, subsequent encounter for fracture with routine healing, Varicose veins of both lower extremities without ulcer or inflammation, and Insomnia, unspecified type were pertinent to this visit.  HPI Teresa Lin presents for follow up on frequent falls,  osteoporosis with history of rib and vertebral fracture,  And recent fall onto right side while bathing.  Patient does not usually take baths,  But was visiting her son and decided to retreat from the family activities by taking a leisurely bath,  And fell while trying to exit the tub.  She struck her ribs on the side of the tub. She was seen recently by colleague for persistent rib pain on the right, and a new 9th rib fracture,  nondisplaced , was noted on plain films.  Since then  Her pain has largely resolved,  And she has had no subsequent falls.   She denies dizziness and is actively participating in classes to improve her strength and balance,   GETTTING LABS TODAY AT [place]    Lab Results  Component Value Date   CHOL 181 08/28/2015   HDL 62.90 08/28/2015   LDLCALC 102 (H) 08/28/2015   TRIG 83.0 08/28/2015   CHOLHDL 3 08/28/2015     Outpatient Medications Prior to Visit  Medication Sig Dispense Refill  . alendronate (FOSAMAX) 70 MG tablet Take by mouth. Reported on 11/13/2015    . aspirin 81 MG tablet Take 81 mg by mouth daily.    . calcium-vitamin D (OSCAL WITH D) 500-200 MG-UNIT per tablet Take 1 tablet by mouth. Reported on 07/19/7562    . folic acid (FOLVITE) 1 MG tablet Take 1 mg by mouth daily.     . hydroxychloroquine (PLAQUENIL) 200 MG tablet Take 200 mg by mouth daily.    Marland Kitchen ibuprofen (ADVIL,MOTRIN) 200 MG tablet Take 200 mg by mouth every 6 (six) hours as needed.    . Lactobacillus Rhamnosus, GG, (CULTURELLE PO) Take 1 tablet by  mouth daily. Probiotic    . methotrexate 50 MG/2ML injection Inject into the skin once a week.     . Multiple Vitamins-Minerals (MULTIVITAMIN WITH MINERALS) tablet Take 1 tablet by mouth daily.     No facility-administered medications prior to visit.     Review of Systems;  Patient denies headache, fevers, malaise, unintentional weight loss, skin rash, eye pain, sinus congestion and sinus pain, sore throat, dysphagia,  hemoptysis , cough, dyspnea, wheezing, chest pain, palpitations, orthopnea, edema, abdominal pain, nausea, melena, diarrhea, constipation, flank pain, dysuria, hematuria, urinary  Frequency, nocturia, numbness, tingling, seizures,  Focal weakness, Loss of consciousness,  Tremor, insomnia, depression, anxiety, and suicidal ideation.      Objective:  BP 136/70 (BP Location: Left Arm, Patient Position: Sitting, Cuff Size: Normal)   Pulse 81   Temp 98.1 F (36.7 C) (Oral)   Resp 15   Ht 5' 5.5" (1.664 m)   Wt 142 lb 9.6 oz (64.7 kg)   SpO2 99%   BMI 23.37 kg/m   BP Readings from Last 3 Encounters:  03/05/17 136/70  02/24/17 124/76  09/12/16 126/68    Wt Readings from Last 3 Encounters:  03/05/17 142 lb 9.6 oz (64.7 kg)  02/24/17 141 lb (64 kg)  09/12/16 140 lb (63.5 kg)    General appearance: alert, cooperative and appears stated  ageNeck: no adenopathy, no carotid bruit, supple, symmetrical, trachea midline and thyroid not enlarged, symmetric, no tenderness/mass/nodules Back: symmetric, no curvature. ROM normal. No CVA tenderness. Lungs: clear to auscultation bilaterally Chest wall: no bruising,  No tenderness  Heart: regular rate and rhythm, S1, S2 normal, no murmur, click, rub or gallop Abdomen: soft, non-tender; bowel sounds normal; no masses,  no organomegaly Pulses: 2+ and symmetric Skin: Skin color, texture, turgor normal. No rashes or lesions Lymph nodes: Cervical, supraclavicular, and axillary nodes normal.  Lab Results  Component Value Date    HGBA1C 5.6 09/05/2014    Lab Results  Component Value Date   CREATININE 0.6 06/06/2016   CREATININE 0.61 11/10/2015   CREATININE 0.51 10/13/2015    Lab Results  Component Value Date   WBC 5.2 09/20/2016   HGB 14.0 09/20/2016   HCT 42 09/20/2016   PLT 307 09/20/2016   GLUCOSE 118 (H) 11/10/2015   CHOL 181 08/28/2015   TRIG 83.0 08/28/2015   HDL 62.90 08/28/2015   LDLCALC 102 (H) 08/28/2015   ALT 14 09/20/2016   AST 20 09/20/2016   NA 143 09/20/2016   K 4.8 09/20/2016   CL 99 (L) 11/10/2015   CREATININE 0.6 06/06/2016   BUN 13 09/20/2016   CO2 27 11/10/2015   TSH 2.31 09/20/2016   INR 0.9 08/08/2011   HGBA1C 5.6 09/05/2014    Dg Ribs Unilateral Right  Result Date: 02/24/2017 CLINICAL DATA:  The patient fell 2 days ago and has persistent right sided ribcage pain. EXAM: RIGHT RIBS - 2 VIEW COMPARISON:  Chest x-ray of Sep 12, 2016 FINDINGS: The lungs are well-expanded and clear. There is no pleural effusion or pneumothorax. The heart and pulmonary vascularity are normal. The mediastinum is normal in width. Right rib detail images include metallic markers over the lower right axillary region. There there is subtle deformity of the anterior aspect of the right 6th rib which appears old. However, there is subtle cortical disruption of the lateral aspect of the right ninth rib. There is an old fracture of the posterior aspect of the left ninth rib. IMPRESSION: Probable nondisplaced right lateral ninth rib fracture. Old bilateral rib fractures are observed as well. There is no acute cardiopulmonary abnormality. Electronically Signed   By: David  Martinique M.D.   On: 02/24/2017 16:25    Assessment & Plan:   Problem List Items Addressed This Visit    Fracture of one rib, right side, subsequent encounter for fracture with routine healing    Pain has resolved, fall precautions reviewed.        Frequent falls    Most recent fall occurred while taking a bath (unusual activity for her)  while visiting her son.  Her pain from nondisplaced 9th rib fracture has resolved.  She denies dizziness and leg weakness/       Insomnia    Managed with melatonin . No changes today       Varicose veins of both lower extremities without ulcer or inflammation    Reminded to  elevate her legs when at home sitting whenever it is possible and wear compression stockings or knee highs to prevent inflammation and swelling daily        Other Visit Diagnoses    Encounter for immunization       Relevant Orders   Flu vaccine HIGH DOSE PF (Completed)      I am having Ms. Zwart maintain her folic acid, hydroxychloroquine, aspirin, calcium-vitamin D, multivitamin with minerals, methotrexate,  alendronate, (Lactobacillus Rhamnosus, GG, (CULTURELLE PO)), and ibuprofen.  No orders of the defined types were placed in this encounter.   There are no discontinued medications.  Follow-up: Return in about 6 months (around 09/03/2017).   Crecencio Mc, MD

## 2017-03-05 NOTE — Patient Instructions (Signed)
[email or web address] has a great selection of of compression stockings in various lengths   You can try plain melatonin up to 6 mg daily  GET A SHOWER SEAT!    NO MORE BATHS!

## 2017-03-08 DIAGNOSIS — S2231XD Fracture of one rib, right side, subsequent encounter for fracture with routine healing: Secondary | ICD-10-CM

## 2017-03-08 HISTORY — DX: Fracture of one rib, right side, subsequent encounter for fracture with routine healing: S22.31XD

## 2017-03-08 NOTE — Assessment & Plan Note (Signed)
Most recent fall occurred while taking a bath (unusual activity for her) while visiting her son.  Her pain from nondisplaced 9th rib fracture has resolved.  She denies dizziness and leg weakness/

## 2017-03-08 NOTE — Assessment & Plan Note (Signed)
Managed with melatonin . No changes today

## 2017-03-08 NOTE — Assessment & Plan Note (Signed)
Reminded to  elevate her legs when at home sitting whenever it is possible and wear compression stockings or knee highs to prevent inflammation and swelling daily

## 2017-03-08 NOTE — Assessment & Plan Note (Signed)
Pain has resolved, fall precautions reviewed.

## 2017-03-28 DIAGNOSIS — Z79899 Other long term (current) drug therapy: Secondary | ICD-10-CM | POA: Diagnosis not present

## 2017-03-28 DIAGNOSIS — M0609 Rheumatoid arthritis without rheumatoid factor, multiple sites: Secondary | ICD-10-CM | POA: Diagnosis not present

## 2017-04-11 DIAGNOSIS — Z08 Encounter for follow-up examination after completed treatment for malignant neoplasm: Secondary | ICD-10-CM | POA: Diagnosis not present

## 2017-04-11 DIAGNOSIS — L814 Other melanin hyperpigmentation: Secondary | ICD-10-CM | POA: Diagnosis not present

## 2017-04-11 DIAGNOSIS — Z85828 Personal history of other malignant neoplasm of skin: Secondary | ICD-10-CM | POA: Diagnosis not present

## 2017-04-11 DIAGNOSIS — D485 Neoplasm of uncertain behavior of skin: Secondary | ICD-10-CM | POA: Diagnosis not present

## 2017-04-11 DIAGNOSIS — X32XXXA Exposure to sunlight, initial encounter: Secondary | ICD-10-CM | POA: Diagnosis not present

## 2017-04-11 DIAGNOSIS — D0439 Carcinoma in situ of skin of other parts of face: Secondary | ICD-10-CM | POA: Diagnosis not present

## 2017-05-02 DIAGNOSIS — G4700 Insomnia, unspecified: Secondary | ICD-10-CM | POA: Diagnosis not present

## 2017-05-02 DIAGNOSIS — R42 Dizziness and giddiness: Secondary | ICD-10-CM | POA: Diagnosis not present

## 2017-05-02 DIAGNOSIS — G3184 Mild cognitive impairment, so stated: Secondary | ICD-10-CM | POA: Diagnosis not present

## 2017-05-15 DIAGNOSIS — L988 Other specified disorders of the skin and subcutaneous tissue: Secondary | ICD-10-CM | POA: Diagnosis not present

## 2017-05-15 DIAGNOSIS — D099 Carcinoma in situ, unspecified: Secondary | ICD-10-CM | POA: Diagnosis not present

## 2017-05-15 DIAGNOSIS — D0439 Carcinoma in situ of skin of other parts of face: Secondary | ICD-10-CM | POA: Diagnosis not present

## 2017-05-15 DIAGNOSIS — L578 Other skin changes due to chronic exposure to nonionizing radiation: Secondary | ICD-10-CM | POA: Diagnosis not present

## 2017-05-15 DIAGNOSIS — L814 Other melanin hyperpigmentation: Secondary | ICD-10-CM | POA: Diagnosis not present

## 2017-06-23 DIAGNOSIS — Z79899 Other long term (current) drug therapy: Secondary | ICD-10-CM | POA: Diagnosis not present

## 2017-06-23 DIAGNOSIS — M0609 Rheumatoid arthritis without rheumatoid factor, multiple sites: Secondary | ICD-10-CM | POA: Diagnosis not present

## 2017-07-07 ENCOUNTER — Ambulatory Visit (INDEPENDENT_AMBULATORY_CARE_PROVIDER_SITE_OTHER): Payer: Medicare Other | Admitting: Internal Medicine

## 2017-07-07 ENCOUNTER — Encounter: Payer: Self-pay | Admitting: Internal Medicine

## 2017-07-07 DIAGNOSIS — B9789 Other viral agents as the cause of diseases classified elsewhere: Secondary | ICD-10-CM | POA: Diagnosis not present

## 2017-07-07 DIAGNOSIS — J069 Acute upper respiratory infection, unspecified: Secondary | ICD-10-CM

## 2017-07-07 MED ORDER — OSELTAMIVIR PHOSPHATE 75 MG PO CAPS: 75.0000 mg | ORAL_CAPSULE | Freq: Every day | ORAL | 0 refills | Status: DC

## 2017-07-07 NOTE — Progress Notes (Signed)
Subjective:  Patient ID: Teresa Lin, female    DOB: 1931-08-05  Age: 84 y.o. MRN: [id number]  CC: The encounter diagnosis was Viral URI with cough.  HPI Teresa Lin presents for evaluation and treatment of resolving cough that has been present for 6 days  .  cold symptoms,  started 12 days after after attending a birthday party.  Headache,  Mild.   No fevers,  facial pain, ear pain.    Lots of sinus drainage and productive cough which resolved.    Outpatient Medications Prior to Visit  Medication Sig Dispense Refill  . alendronate (FOSAMAX) 70 MG tablet Take by mouth. Reported on 11/13/2015    . aspirin 81 MG tablet Take 81 mg by mouth daily.    . calcium-vitamin D (OSCAL WITH D) 500-200 MG-UNIT per tablet Take 1 tablet by mouth. Reported on 10/15/9933    . folic acid (FOLVITE) 1 MG tablet Take 1 mg by mouth daily.     . hydroxychloroquine (PLAQUENIL) 200 MG tablet Take 200 mg by mouth daily.    . Lactobacillus Rhamnosus, GG, (CULTURELLE PO) Take 1 tablet by mouth daily. Probiotic    . methotrexate 50 MG/2ML injection Inject into the skin once a week.     . Multiple Vitamins-Minerals (MULTIVITAMIN WITH MINERALS) tablet Take 1 tablet by mouth daily.    Marland Kitchen ibuprofen (ADVIL,MOTRIN) 200 MG tablet Take 200 mg by mouth every 6 (six) hours as needed.     No facility-administered medications prior to visit.     Review of Systems;  Patient denies headache, fevers, malaise, unintentional weight loss, skin rash, eye pain, sinus congestion and sinus pain, sore throat, dysphagia,  hemoptysis ,, dyspnea, wheezing, chest pain, palpitations, orthopnea, edema, abdominal pain, nausea, melena, diarrhea, constipation, flank pain, dysuria, hematuria, urinary  Frequency, nocturia, numbness, tingling, seizures,  Focal weakness, Loss of consciousness,  Tremor, insomnia, depression, anxiety, and suicidal ideation.      Objective:  BP 134/68 (BP Location: Left Arm, Patient Position: Sitting, Cuff Size: Normal)    Pulse 88   Temp 97.9 F (36.6 C) (Oral)   Resp 15   Ht 5' 5.5" (1.664 m)   Wt 147 lb (66.7 kg)   SpO2 98%   BMI 24.09 kg/m   BP Readings from Last 3 Encounters:  07/07/17 134/68  03/05/17 136/70  02/24/17 124/76    Wt Readings from Last 3 Encounters:  07/07/17 147 lb (66.7 kg)  03/05/17 142 lb 9.6 oz (64.7 kg)  02/24/17 141 lb (64 kg)    General appearance: alert, cooperative and appears stated age Ears: normal TM's and external ear canals both ears Throat: lips, mucosa, and tongue normal; teeth and gums normal Neck: no adenopathy, no carotid bruit, supple, symmetrical, trachea midline and thyroid not enlarged, symmetric, no tenderness/mass/nodules Back: symmetric, no curvature. ROM normal. No CVA tenderness. Lungs: clear to auscultation bilaterally Heart: regular rate and rhythm, S1, S2 normal, no murmur, click, rub or gallop Abdomen: soft, non-tender; bowel sounds normal; no masses,  no organomegaly Pulses: 2+ and symmetric Skin: Skin color, texture, turgor normal. No rashes or lesions Lymph nodes: Cervical, supraclavicular, and axillary nodes normal.  Lab Results  Component Value Date   HGBA1C 5.6 09/05/2014    Lab Results  Component Value Date   CREATININE 0.6 06/06/2016   CREATININE 0.61 11/10/2015   CREATININE 0.51 10/13/2015    Lab Results  Component Value Date   WBC 5.2 09/20/2016   HGB 14.0 09/20/2016  HCT 42 09/20/2016   PLT 307 09/20/2016   GLUCOSE 118 (H) 11/10/2015   CHOL 181 08/28/2015   TRIG 83.0 08/28/2015   HDL 62.90 08/28/2015   LDLCALC 102 (H) 08/28/2015   ALT 14 09/20/2016   AST 20 09/20/2016   NA 143 09/20/2016   K 4.8 09/20/2016   CL 99 (L) 11/10/2015   CREATININE 0.6 06/06/2016   BUN 13 09/20/2016   CO2 27 11/10/2015   TSH 2.31 09/20/2016   INR 0.9 08/08/2011   HGBA1C 5.6 09/05/2014    Dg Ribs Unilateral Right  Result Date: 02/24/2017 CLINICAL DATA:  The patient fell 2 days ago and has persistent right sided ribcage  pain. EXAM: RIGHT RIBS - 2 VIEW COMPARISON:  Chest x-ray of Sep 12, 2016 FINDINGS: The lungs are well-expanded and clear. There is no pleural effusion or pneumothorax. The heart and pulmonary vascularity are normal. The mediastinum is normal in width. Right rib detail images include metallic markers over the lower right axillary region. There there is subtle deformity of the anterior aspect of the right 6th rib which appears old. However, there is subtle cortical disruption of the lateral aspect of the right ninth rib. There is an old fracture of the posterior aspect of the left ninth rib. IMPRESSION: Probable nondisplaced right lateral ninth rib fracture. Old bilateral rib fractures are observed as well. There is no acute cardiopulmonary abnormality. Electronically Signed   By: David  Martinique M.D.   On: 02/24/2017 16:25    Assessment & Plan:   Problem List Items Addressed This Visit    Viral URI with cough    Exam is normal.   Reassurance provided  supportive care outlined.  Cough suppressant.   rx for tammiflu given for use if she has flu exposure      Relevant Medications   oseltamivir (TAMIFLU) 75 MG capsule      I have discontinued Analaya W. Campanella's ibuprofen. I am also having her start on oseltamivir. Additionally, I am having her maintain her folic acid, hydroxychloroquine, aspirin, calcium-vitamin D, multivitamin with minerals, methotrexate, alendronate, and (Lactobacillus Rhamnosus, GG, (CULTURELLE PO)).  Meds ordered this encounter  Medications  . oseltamivir (TAMIFLU) 75 MG capsule    Sig: Take 1 capsule (75 mg total) by mouth daily.    Dispense:  10 capsule    Refill:  0    Medications Discontinued During This Encounter  Medication Reason  . ibuprofen (ADVIL,MOTRIN) 200 MG tablet Patient has not taken in last 30 days    Follow-up: Return in about 6 months (around 01/04/2018).   Crecencio Mc, MD

## 2017-07-07 NOTE — Patient Instructions (Signed)
You do not need a chest x ray   Your recent labs were all normal    Ok to increase your melatonin to 3 mg ,  Even up to 5 mg if  needed   Start the tamiflu if you have another flu exposure  increase dose to twice daily if you develop flu symptoms,  and call for refill

## 2017-07-08 DIAGNOSIS — M79641 Pain in right hand: Secondary | ICD-10-CM | POA: Diagnosis not present

## 2017-07-08 DIAGNOSIS — Z79899 Other long term (current) drug therapy: Secondary | ICD-10-CM | POA: Diagnosis not present

## 2017-07-08 DIAGNOSIS — M1990 Unspecified osteoarthritis, unspecified site: Secondary | ICD-10-CM | POA: Diagnosis not present

## 2017-07-08 DIAGNOSIS — M79642 Pain in left hand: Secondary | ICD-10-CM | POA: Diagnosis not present

## 2017-07-08 NOTE — Assessment & Plan Note (Addendum)
Exam is normal.   Reassurance provided  supportive care outlined.  Cough suppressant.   rx for tammiflu given for use if she has flu exposure

## 2017-07-10 ENCOUNTER — Other Ambulatory Visit: Payer: Self-pay

## 2017-07-10 ENCOUNTER — Encounter: Payer: Self-pay | Admitting: *Deleted

## 2017-07-10 ENCOUNTER — Emergency Department
Admission: EM | Admit: 2017-07-10 | Discharge: 2017-07-10 | Disposition: A | Discharge status: Home / Self Care | Payer: Medicare Other | Attending: Emergency Medicine | Admitting: Emergency Medicine

## 2017-07-10 ENCOUNTER — Emergency Department: Payer: Medicare Other

## 2017-07-10 DIAGNOSIS — M069 Rheumatoid arthritis, unspecified: Secondary | ICD-10-CM | POA: Insufficient documentation

## 2017-07-10 DIAGNOSIS — R079 Chest pain, unspecified: Secondary | ICD-10-CM | POA: Diagnosis not present

## 2017-07-10 DIAGNOSIS — R0789 Other chest pain: Secondary | ICD-10-CM | POA: Diagnosis not present

## 2017-07-10 DIAGNOSIS — Z96653 Presence of artificial knee joint, bilateral: Secondary | ICD-10-CM | POA: Diagnosis not present

## 2017-07-10 DIAGNOSIS — Z79899 Other long term (current) drug therapy: Secondary | ICD-10-CM | POA: Insufficient documentation

## 2017-07-10 LAB — CBC
HCT: 42 % (ref 35.0–47.0)
Hemoglobin: 14.1 g/dL (ref 12.0–16.0)
MCH: 32 pg (ref 26.0–34.0)
MCHC: 33.6 g/dL (ref 32.0–36.0)
MCV: 95.3 fL (ref 80.0–100.0)
Platelets: 324 10*3/uL (ref 150–440)
RBC: 4.41 MIL/uL (ref 3.80–5.20)
RDW: 13.8 % (ref 11.5–14.5)
WBC: 7.5 10*3/uL (ref 3.6–11.0)

## 2017-07-10 LAB — BASIC METABOLIC PANEL
Anion gap: 10 (ref 5–15)
BUN: 19 mg/dL (ref 6–20)
CO2: 27 mmol/L (ref 22–32)
Calcium: 9.3 mg/dL (ref 8.9–10.3)
Chloride: 103 mmol/L (ref 101–111)
Creatinine, Ser: 0.46 mg/dL (ref 0.44–1.00)
GFR calc Af Amer: >60 mL/min (ref >60)
GFR calc non Af Amer: >60 mL/min (ref >60)
Glucose, Bld: 111 mg/dL — ABNORMAL HIGH (ref 65–99)
Potassium: 4.3 mmol/L (ref 3.5–5.1)
Sodium: 140 mmol/L (ref 135–145)

## 2017-07-10 LAB — TROPONIN I
Troponin I: <0.03 ng/mL (ref <0.03)
Troponin I: <0.03 ng/mL (ref <0.03)

## 2017-07-10 NOTE — ED Triage Notes (Addendum)
Pt to ED reporting sudden onset of left sided chest pain that l;asted 10 minutes and is now reported to feel like a heaviness. No SOB or dizziness reported. Nausea but no vomiting. Pt denies a heart hx. Pt reports she became pale per her family when the pain started. No syncope reported and pt is not diaphoretic at this time. Color is appropriate at this time.

## 2017-07-10 NOTE — Discharge Instructions (Signed)
Please seek medical attention for any high fevers, chest pain, shortness of breath, change in behavior, persistent vomiting, bloody stool or any other new or concerning symptoms.  

## 2017-07-10 NOTE — ED Notes (Signed)

## 2017-07-10 NOTE — ED Provider Notes (Signed)
[facility] Emergency Department Provider Note   ____________________________________________   I have reviewed the triage vital signs and the nursing notes.   HISTORY  Chief Complaint Chest Pain   History limited by: Not Limited   HPI Teresa Lin is a 84 y.o. female who presents to the emergency department today with concerns for chest pain.  Is located in her center chest.  It lasted about 10 minutes.  It happened at this afternoon.  It was pressure in quality.  Patient denies any unusual activity or ingestions today.  She denies any associated shortness of breath or diaphoresis.  Denies similar pain in the past.  States she has had a head cold recently.   Per medical record review patient has a history of collagen vascular disease.  Past Medical History:  Diagnosis Date  . Arthritis   . Collagen vascular disease ([place])   . Colon polyps   . Phlebitis and thrombophlebitis of deep veins of lower extremities ([place])    after surgery    Patient Active Problem List   Diagnosis Date Noted  . Fracture of one rib, right side, subsequent encounter for fracture with routine healing 03/08/2017  . Orthostasis 06/05/2016  . Vaginitis and vulvovaginitis 02/08/2016  . Left shoulder pain 10/13/2015  . Venous stasis ulcer of right lower extremity ([place]) 08/08/2015  . Constipation 05/30/2015  . Osteoporosis, postmenopausal 05/30/2015  . Compression fracture of L2 lumbar vertebra with delayed healing 05/29/2015  . Frequent falls 03/26/2015  . Atherosclerosis of arteries 03/26/2015  . Chest pain 02/09/2015  . Breast cancer screening 10/01/2014  . Family history of colon cancer 09/28/2014  . Viral URI with cough 06/12/2014  . Tenesmus (rectal) 06/12/2014  . TMJ (temporomandibular joint syndrome) 06/12/2014  . Varicose veins of both lower extremities without ulcer or inflammation 06/12/2014  . B12 deficiency 09/20/2013  . Internal hemorrhoids without complication  16/02/9603  . Insomnia 03/03/2013  . Vertigo due to cerebrovascular disease 03/03/2013  . Rheumatoid arthritis ([place]) 03/03/2013    Past Surgical History:  Procedure Laterality Date  . APPENDECTOMY    . JOINT REPLACEMENT Bilateral    knee replacement    Prior to Admission medications   Medication Sig Start Date End Date Taking? Authorizing Provider  alendronate (FOSAMAX) 70 MG tablet Take by mouth. Reported on 11/13/2015 06/14/15   [provider]  aspirin 81 MG tablet Take 81 mg by mouth daily.    [provider]  calcium-vitamin D (OSCAL WITH D) 500-200 MG-UNIT per tablet Take 1 tablet by mouth. Reported on 11/13/2015    [provider]  folic acid (FOLVITE) 1 MG tablet Take 1 mg by mouth daily.     [provider]  hydroxychloroquine (PLAQUENIL) 200 MG tablet Take 200 mg by mouth daily.    [provider]  Lactobacillus Rhamnosus, GG, (CULTURELLE PO) Take 1 tablet by mouth daily. Probiotic    [provider]  methotrexate 50 MG/2ML injection Inject into the skin once a week.  03/16/15   [provider]  Multiple Vitamins-Minerals (MULTIVITAMIN WITH MINERALS) tablet Take 1 tablet by mouth daily.    [provider]  oseltamivir (TAMIFLU) 75 MG capsule Take 1 capsule (75 mg total) by mouth daily. 07/07/17   Crecencio Mc, MD    Allergies Infliximab and Synvisc [hylan g-f 20]  Family History  Problem Relation Age of Onset  . Hypertension Mother   . Cancer Mother        colon  ca  . Heart attack Mother   . Hypertension Father   . Heart attack Father   . Hypertension Sister   . Heart attack Sister   . Multiple sclerosis Daughter   . Multiple sclerosis Son   . Multiple sclerosis Son     Social History Social History   Tobacco Use  . Smoking status: Never Smoker  . Smokeless tobacco: Never Used  Substance Use Topics  . Alcohol use: Yes    Alcohol/week: 1.2 oz    Types: 2 Cans of beer per week    Comment:  occasionally   . Drug use: No    Review of Systems Constitutional: No fever/chills Eyes: No visual changes. ENT: No sore throat. Cardiovascular: Positive for chest pain. Respiratory: Denies shortness of breath. Gastrointestinal: No abdominal pain.  No nausea, no vomiting.  No diarrhea.   Genitourinary: Negative for dysuria. Musculoskeletal: Negative for back pain. Skin: Negative for rash. Neurological: Negative for headaches, focal weakness or numbness.  ____________________________________________   PHYSICAL EXAM:  VITAL SIGNS: ED Triage Vitals  Enc Vitals Group     BP 07/10/17 1642 (!) 142/68     Pulse Rate 07/10/17 1642 85     Resp 07/10/17 1642 15     Temp 07/10/17 1642 98.2 F (36.8 C)     Temp Source 07/10/17 1642 Oral     SpO2 07/10/17 1642 98 %     Weight 07/10/17 1643 147 lb (66.7 kg)     Height 07/10/17 1643 5\' 5"  (1.651 m)     Head Circumference --      Peak Flow --      Pain Score 07/10/17 1643 8   Constitutional: Alert and oriented. Well appearing and in no distress. Eyes: Conjunctivae are normal.  ENT   Head: Normocephalic and atraumatic.   Nose: No congestion/rhinnorhea.   Mouth/Throat: Mucous membranes are moist.   Neck: No stridor. Hematological/Lymphatic/Immunilogical: No cervical lymphadenopathy. Cardiovascular: Normal rate, regular rhythm.  No murmurs, rubs, or gallops.  Respiratory: Normal respiratory effort without tachypnea nor retractions. Breath sounds are clear and equal bilaterally. No wheezes/rales/rhonchi. Gastrointestinal: Soft and non tender. No rebound. No guarding.  Genitourinary: Deferred Musculoskeletal: Normal range of motion in all extremities.  Neurologic:  Normal speech and language. No gross focal neurologic deficits are appreciated.  Skin:  Skin is warm, dry and intact. No rash noted. Psychiatric: Mood and affect are normal. Speech and behavior are normal. Patient exhibits appropriate insight and  judgment.  ____________________________________________    LABS (pertinent positives/negatives)  Trop <0.03 x 2 CBC wnl BMP wnl except glucose 111  ____________________________________________   EKG  I, Nance Pear, attending physician, personally viewed and interpreted this EKG  EKG Time: 1639 Rate: 84 Rhythm: normal sinus rhythm Axis: normal Intervals: qtc 451 QRS: RBBB ST changes: no st elevation Impression: abnormal ekg   ____________________________________________    RADIOLOGY  CXR No acute disase   ____________________________________________   PROCEDURES  Procedures  ____________________________________________   INITIAL IMPRESSION / ASSESSMENT AND PLAN / ED COURSE  Pertinent labs & imaging results that were available during my care of the patient were reviewed by me and considered in my medical decision making (see chart for details).  Patient presented to the emergency department today because of concerns for chest pain.  My exam chest pain had resolved.  Differential would include ACS, PE, dissection, pneumothorax, and pneumonia amongst other etiologies.  Chest x-ray EKG and blood work without any acute findings.  This point I doubt  PE or dissection given short-lived nature of pain and lack of other symptoms.  Do think it is reasonable for patient to be discharged from the emergency department to follow-up with her cardiologist and primary care.  Discussed findings and plan with patient.  ____________________________________________   FINAL CLINICAL IMPRESSION(S) / ED DIAGNOSES  Final diagnoses:  Nonspecific chest pain     Note: This dictation was prepared with Dragon dictation. Any transcriptional errors that result from this process are unintentional     Nance Pear, MD 07/10/17 2102

## 2017-07-10 NOTE — ED Notes (Signed)
Pt states "my blood pressure is normally normal to low, I don't know why it's been elevated recently."

## 2017-07-22 DIAGNOSIS — E785 Hyperlipidemia, unspecified: Secondary | ICD-10-CM | POA: Diagnosis not present

## 2017-07-22 DIAGNOSIS — I10 Essential (primary) hypertension: Secondary | ICD-10-CM | POA: Diagnosis not present

## 2017-07-22 DIAGNOSIS — R079 Chest pain, unspecified: Secondary | ICD-10-CM | POA: Diagnosis not present

## 2017-07-22 DIAGNOSIS — I82890 Acute embolism and thrombosis of other specified veins: Secondary | ICD-10-CM | POA: Diagnosis not present

## 2017-07-30 DIAGNOSIS — D0439 Carcinoma in situ of skin of other parts of face: Secondary | ICD-10-CM | POA: Diagnosis not present

## 2017-08-05 ENCOUNTER — Other Ambulatory Visit: Payer: Self-pay

## 2017-08-05 ENCOUNTER — Ambulatory Visit: Payer: Medicare Other | Attending: Rheumatology | Admitting: Occupational Therapy

## 2017-08-05 DIAGNOSIS — M79641 Pain in right hand: Secondary | ICD-10-CM | POA: Insufficient documentation

## 2017-08-05 DIAGNOSIS — M79642 Pain in left hand: Secondary | ICD-10-CM | POA: Diagnosis not present

## 2017-08-05 NOTE — Therapy (Signed)
[facility] [address] [place], [address] Phone: [phone]   Fax:  [phone]  Occupational Therapy Evaluation  Patient Details  Name: Teresa Lin MRN: [id number] Date of Birth: [birth year] Referring Provider: Jefm Bryant    Encounter Date: 08/05/2017  OT End of Session - 08/05/17 1711    Visit Number  1    Number of Visits  2    Date for OT Re-Evaluation  08/19/17    OT Start Time  5397    OT Stop Time  1430    OT Time Calculation (min)  57 min    Activity Tolerance  Patient tolerated treatment well    Behavior During Therapy  [facility] for tasks assessed/performed       Past Medical History:  Diagnosis Date  . Arthritis   . Collagen vascular disease ([place])   . Colon polyps   . Phlebitis and thrombophlebitis of deep veins of lower extremities ([place])    after surgery    Past Surgical History:  Procedure Laterality Date  . APPENDECTOMY    . JOINT REPLACEMENT Bilateral    knee replacement    There were no vitals filed for this visit.  Subjective Assessment - 08/05/17 1701    Subjective   My hands get these cramps, spasms and draw up - and then the pain can go up to 7-8/10 - at rest it is 0/10 - trouble holding objects, dropping things , writing     Patient Stated Goals  Do want to prevent my hands getting worse - and the spasms better     Currently in Pain?  No/denies        [facility] OT Assessment - 08/05/17 0001      Assessment   Medical Diagnosis  bilateral hand pain , inflammattory arthritis     Referring Provider  kernodle     Onset Date/Surgical Date  07/08/17    Hand Dominance  Right      Home  Environment   Lives With  Alone      Prior Function   Vocation  Retired    Leisure  Retired Pharmacist, hospital , likes to work in yard, travel , read books, card games , do own house work       Pharmacist, community Grip (lbs)  40    Right Hand Lateral Pinch  12 lbs    Right Hand 3 Point Pinch  9 lbs     Left Hand Grip (lbs)  43    Left Hand Lateral Pinch  10 lbs    Left Hand 3 Point Pinch  10 lbs      Right Hand AROM   R Thumb Opposition to Index  -- Arthritic changes to thumb CMC - unable do pad on pad pinch    R Index  MCP 0-90  75 Degrees      Left Hand AROM   L Thumb Opposition to Index  -- Arthritic changes to thumb CMC - unable do pad on pad pinch    L Index  MCP 0-90  75 Degrees         Review with pt HEP -  Moist heat to be use in am and if needed in PM Tendon glides Opposition AROM - pain free  Joint protection principles AE education  Done and hand out provided and review             OT Education - 08/05/17  70    Education provided  Yes    Education Details  findings of eval and HEP review with joint protection     Person(s) Educated  Patient    Methods  Explanation;Demonstration    Comprehension  Verbalized understanding;Returned demonstration          OT Long Term Goals - 08/05/17 1718      OT LONG TERM GOAL #1   Title  Pt verbalize 3 joint protection and AE she is using to decrease pain in hands     Baseline  very little knowledge - pain increase to 8/10     Time  2    Period  Weeks    Status  New    Target Date  08/19/17      OT LONG TERM GOAL #2   Title  Pt show independent in AROM to decrease spasms - and pain less than 5/10 at the worse     Baseline  pain increase to 8/10 and no knowledge of HEP for maintaining her ROM     Time  2    Period  Weeks    Status  New    Target Date  08/19/17            Plan - 08/05/17 1712    Clinical Impression Statement  Pt present with diagnosis of inflammatory arthritis  with hand pain - pt showed AROM in all digits WNL except bilateral MC's of 2nd digit and thumb CMC - but  grip and prehension strength  is WNL for her age - and actually in higher range for her age - she do report cramping of hands,  with increase pain at times - did review with pt joint protection principles , and AE   education - as well as AROM  HEP - pt to do HEP for about 2 wks and modifications and return for folllow up     Occupational performance deficits (Please refer to evaluation for details):  ADL's;IADL's;Play;Leisure    Rehab Potential  Fair    Current Impairments/barriers affecting progress:  chronic condition     OT Frequency  Biweekly    OT Duration  2 weeks    OT Treatment/Interventions  Therapeutic exercise;Self-care/ADL training;DME and/or AE instruction;Manual Therapy;Paraffin;Patient/family education    Plan  Follow up in 2 wks - modifying way she use her hands     Clinical Decision Making  Limited treatment options, no task modification necessary    OT Home Exercise Plan  see pt instruction     Consulted and Agree with Plan of Care  Patient       Patient will benefit from skilled therapeutic intervention in order to improve the following deficits and impairments:  Pain, Impaired UE functional use  Visit Diagnosis: Pain in left hand - Plan: Ot plan of care cert/re-cert  Pain in right hand - Plan: Ot plan of care cert/re-cert    Problem List Patient Active Problem List   Diagnosis Date Noted  . Fracture of one rib, right side, subsequent encounter for fracture with routine healing 03/08/2017  . Orthostasis 06/05/2016  . Vaginitis and vulvovaginitis 02/08/2016  . Left shoulder pain 10/13/2015  . Venous stasis ulcer of right lower extremity ([place]) 08/08/2015  . Constipation 05/30/2015  . Osteoporosis, postmenopausal 05/30/2015  . Compression fracture of L2 lumbar vertebra with delayed healing 05/29/2015  . Frequent falls 03/26/2015  . Atherosclerosis of arteries 03/26/2015  . Chest pain 02/09/2015  . Breast cancer screening 10/01/2014  .  Family history of colon cancer 09/28/2014  . Viral URI with cough 06/12/2014  . Tenesmus (rectal) 06/12/2014  . TMJ (temporomandibular joint syndrome) 06/12/2014  . Varicose veins of both lower extremities without ulcer or inflammation  06/12/2014  . B12 deficiency 09/20/2013  . Internal hemorrhoids without complication 74/25/9563  . Insomnia 03/03/2013  . Vertigo due to cerebrovascular disease 03/03/2013  . Rheumatoid arthritis ([place]) 03/03/2013    Rosalyn Gess OTR/L,CLT 08/05/2017, 5:22 PM  [facility] [address], [place], [address] Phone: [phone]   Fax:  [phone]  Name: Teresa Lin MRN: [id number] Date of Birth: December 26, 1931

## 2017-08-05 NOTE — Patient Instructions (Signed)
Moist heat Tendon glides Opposition AROM - pain free  Joint protection principles AE education

## 2017-08-14 DIAGNOSIS — R079 Chest pain, unspecified: Secondary | ICD-10-CM | POA: Diagnosis not present

## 2017-08-14 DIAGNOSIS — Z800 Family history of malignant neoplasm of digestive organs: Secondary | ICD-10-CM | POA: Diagnosis not present

## 2017-08-14 DIAGNOSIS — R194 Change in bowel habit: Secondary | ICD-10-CM | POA: Diagnosis not present

## 2017-08-15 DIAGNOSIS — E785 Hyperlipidemia, unspecified: Secondary | ICD-10-CM | POA: Diagnosis not present

## 2017-08-15 DIAGNOSIS — R079 Chest pain, unspecified: Secondary | ICD-10-CM | POA: Diagnosis not present

## 2017-08-15 DIAGNOSIS — I10 Essential (primary) hypertension: Secondary | ICD-10-CM | POA: Diagnosis not present

## 2017-08-19 ENCOUNTER — Ambulatory Visit: Payer: Medicare Other | Attending: Rheumatology | Admitting: Occupational Therapy

## 2017-08-19 DIAGNOSIS — M79642 Pain in left hand: Secondary | ICD-10-CM | POA: Diagnosis not present

## 2017-08-19 DIAGNOSIS — M79641 Pain in right hand: Secondary | ICD-10-CM | POA: Diagnosis not present

## 2017-08-19 NOTE — Therapy (Signed)
[facility] [address] [place], [address] Phone: [phone]   Fax:  [phone]  Occupational Therapy Treatment  Patient Details  Name: Teresa Lin MRN: [id number] Date of Birth: 16-May-1931 Referring Provider: Jefm Bryant    Encounter Date: 08/19/2017  OT End of Session - 08/19/17 1004    Visit Number  2    Number of Visits  2    Date for OT Re-Evaluation  08/19/17    OT Start Time  0916    OT Stop Time  0957    OT Time Calculation (min)  41 min    Activity Tolerance  Patient tolerated treatment well    Behavior During Therapy  [facility] for tasks assessed/performed       Past Medical History:  Diagnosis Date  . Arthritis   . Collagen vascular disease ([place])   . Colon polyps   . Phlebitis and thrombophlebitis of deep veins of lower extremities ([place])    after surgery    Past Surgical History:  Procedure Laterality Date  . APPENDECTOMY    . JOINT REPLACEMENT Bilateral    knee replacement    There were no vitals filed for this visit.  Subjective Assessment - 08/19/17 1001    Subjective   MY hands did not cramp this week - not even every day - still increase to 8/10 but better- but can you go over my exercises again with me - my R wrist bother me with doing them     Patient Stated Goals  Do want to prevent my hands getting worse - and the spasms better     Currently in Pain?  No/denies         [facility] OT Assessment - 08/19/17 0001      Strength   Right Hand Grip (lbs)  40    Right Hand Lateral Pinch  12 lbs    Right Hand 3 Point Pinch  9 lbs    Left Hand Grip (lbs)  43    Left Hand Lateral Pinch  10 lbs    Left Hand 3 Point Pinch  10 lbs      Right Hand AROM   R Index  MCP 0-90  75 Degrees after paraffin 80    R Long PIP 0-100  60 Degrees      Left Hand AROM   L Index  MCP 0-90  75 Degrees       assess AROM and grip - see flowsheet     Review with pt joint protection and AE - demo and  pt demo some - and report she did already made some changes - using larger joints,  But need to do few more and AE - built up pen, handles , Pen again - springloaded scissors   she did do exercises in pool - and that felt good - the heat     OT Treatments/Exercises (OP) - 08/19/17 0001      RUE Paraffin   Number Minutes Paraffin  10 Minutes    RUE Paraffin Location  Hand    Comments  prior to review of HEP to decrease stiffness         Review with pt HEP - Reintegrate use of  Moist heat to be use in am and if needed in PM prior to ROM  Tendon glides - but only AROM - and brace wrist - but not push on CMC of thumb - pt was blocking  on thumb CMC  - 10 reps Opposition AROM - pain free - only 5 reps   Joint protection principles AE education  Review hand out that was provided last time        OT Education - 08/19/17 1003    Education provided  Yes    Education Details  Review AROM HEP again  and joint protection     Person(s) Educated  Patient    Methods  Explanation;Demonstration;Tactile cues;Verbal cues;Handout    Comprehension  Returned demonstration          OT Long Term Goals - 08/19/17 1006      OT LONG TERM GOAL #1   Title  Pt verbalize 3 joint protection and AE she is using to decrease pain in hands     Baseline  using larger joints, and AE getting some - springloaded scissors, Penagain, insulation tubing     Status  Achieved      OT LONG TERM GOAL #2   Title  Pt show independent in AROM to decrease spasms - and pain less than 5/10 at the worse     Baseline  spams decrease to no every day - but still increase to 7-8/10    Status  Partially Met            Plan - 08/19/17 1004    Clinical Impression Statement  Pt present this date with report of decrease spasms in hands - did not happend every day - but still intense - AROM same and grip/prehension strength - but needed review of HEP for correct position to decrease thumb CMC pain on R hand  - pt  discharge this date with homeprogram     Occupational performance deficits (Please refer to evaluation for details):  ADL's;IADL's;Play;Leisure    Rehab Potential  Fair    Current Impairments/barriers affecting progress:  chronic condition     Plan  discharge with homeprogram     Clinical Decision Making  Limited treatment options, no task modification necessary    OT Home Exercise Plan  see pt instruction     Consulted and Agree with Plan of Care  Patient       Patient will benefit from skilled therapeutic intervention in order to improve the following deficits and impairments:     Visit Diagnosis: Pain in left hand  Pain in right hand    Problem List Patient Active Problem List   Diagnosis Date Noted  . Fracture of one rib, right side, subsequent encounter for fracture with routine healing 03/08/2017  . Orthostasis 06/05/2016  . Vaginitis and vulvovaginitis 02/08/2016  . Left shoulder pain 10/13/2015  . Venous stasis ulcer of right lower extremity ([place]) 08/08/2015  . Constipation 05/30/2015  . Osteoporosis, postmenopausal 05/30/2015  . Compression fracture of L2 lumbar vertebra with delayed healing 05/29/2015  . Frequent falls 03/26/2015  . Atherosclerosis of arteries 03/26/2015  . Chest pain 02/09/2015  . Breast cancer screening 10/01/2014  . Family history of colon cancer 09/28/2014  . Viral URI with cough 06/12/2014  . Tenesmus (rectal) 06/12/2014  . TMJ (temporomandibular joint syndrome) 06/12/2014  . Varicose veins of both lower extremities without ulcer or inflammation 06/12/2014  . B12 deficiency 09/20/2013  . Internal hemorrhoids without complication 97/67/3419  . Insomnia 03/03/2013  . Vertigo due to cerebrovascular disease 03/03/2013  . Rheumatoid arthritis ([place]) 03/03/2013    Rosalyn Gess OTR/L,CLT 08/19/2017, 10:08 AM  [place] [facility] [address]  [place], [place], [address] Phone:  [phone]   Fax:  [phone]  Name: ARNESHA SCHIRALDI MRN: [id number] Date of Birth: Oct 15, 1931

## 2017-08-19 NOTE — Patient Instructions (Signed)
Sam HEP   not to force tendon glides   ONLY AROM - not strengthening   Joint protection principles   Larger joints and built up handles  And moist heat prior to ROM

## 2017-09-02 ENCOUNTER — Ambulatory Visit: Payer: Medicare Other

## 2017-09-03 ENCOUNTER — Ambulatory Visit (INDEPENDENT_AMBULATORY_CARE_PROVIDER_SITE_OTHER): Payer: Medicare Other | Admitting: Internal Medicine

## 2017-09-03 ENCOUNTER — Ambulatory Visit (INDEPENDENT_AMBULATORY_CARE_PROVIDER_SITE_OTHER): Payer: Medicare Other

## 2017-09-03 ENCOUNTER — Encounter: Payer: Self-pay | Admitting: Internal Medicine

## 2017-09-03 VITALS — BP 126/68 | HR 65 | Temp 98.1°F | Resp 15 | Ht 65.0 in | Wt 146.0 lb

## 2017-09-03 VITALS — BP 126/68 | HR 65 | Temp 98.1°F | Resp 14 | Ht 65.0 in | Wt 146.0 lb

## 2017-09-03 DIAGNOSIS — Z0000 Encounter for general adult medical examination without abnormal findings: Secondary | ICD-10-CM | POA: Diagnosis not present

## 2017-09-03 DIAGNOSIS — F411 Generalized anxiety disorder: Secondary | ICD-10-CM | POA: Diagnosis not present

## 2017-09-03 DIAGNOSIS — K580 Irritable bowel syndrome with diarrhea: Secondary | ICD-10-CM

## 2017-09-03 DIAGNOSIS — R296 Repeated falls: Secondary | ICD-10-CM | POA: Diagnosis not present

## 2017-09-03 DIAGNOSIS — Z1239 Encounter for other screening for malignant neoplasm of breast: Secondary | ICD-10-CM

## 2017-09-03 NOTE — Patient Instructions (Addendum)
Switch to Lactaid milk  Or Soymilk  For your calcium (vanilla flavored is best)  Tai chi   Is the best activity for core and balance  I agree that your anxeity over your son's condition is weighing heavily on you  counselling to help you learn to 'let go" and "let God" control things may help   [employer] is available online to help you find verses that address anxiety, fear,  Etc (any topic)  Please ask Dr Jefm Bryant to do a lipid panel and a CMET

## 2017-09-03 NOTE — Progress Notes (Signed)
Subjective:  Patient ID: Teresa Lin, female    DOB: 1931/07/19  Age: 84 y.o. MRN: [id number]  CC: The primary encounter diagnosis was Breast cancer screening. Diagnoses of Frequent falls, Generalized anxiety disorder, and Irritable bowel syndrome with diarrhea were also pertinent to this visit.  HPI Teresa Lin presents for follow up on multiple issues  Since her last visit   Treated Feb 25  For  viral URI with flu exposure ,  Negative rapid flu test   Ended up in ER Feb 29 with chest pain. troponins neg x 2 EKG reviewed no change to septal q wave.  Old RBBB   Had cardiac evaluation with paraschos. Stress ECHO done mar 12 ; normal.  Ef > 55%  M/m mitral insuffuciency,  No further workup per April 5 follow up with paraschos  Seen by GI for IBS (diarrhea occurring once PER  WEEK) >  citrucel ,  Suspension of milk advised . Colonoscopy planned June 14  (LAST ONE 2013; HAS  Fh of + COLON CA)  Recurrent falls:  [place] on to right hip and buttock when using a step ladder.  Bruised hip,  [place] once in the bedroom . Thinks  she is hurrrying too much.  [facility]    Now using hearing aides. .  Felt that her hearing deteriorated afer flying to [place] popped once during flight but she denies any episode of ear pain   Discussed anxiety.  Son has MS, he is  93 yrs old, has a  teenage son and wife.  Worries constantly about him .  Has recurrent conflicts with son over how he is living his life and handling his disability.    Outpatient Medications Prior to Visit  Medication Sig Dispense Refill  . alendronate (FOSAMAX) 70 MG tablet Take by mouth. Reported on 11/13/2015    . aspirin 81 MG tablet Take 81 mg by mouth daily.    . calcium-vitamin D (OSCAL WITH D) 500-200 MG-UNIT per tablet Take 1 tablet by mouth. Reported on 5/0/2774    . folic acid (FOLVITE) 1 MG tablet Take 1 mg by mouth daily.     . hydroxychloroquine (PLAQUENIL) 200 MG tablet Take  200 mg by mouth daily.    . Lactobacillus Rhamnosus, GG, (CULTURELLE PO) Take 1 tablet by mouth daily. Probiotic    . methotrexate 50 MG/2ML injection Inject into the skin once a week.     . Multiple Vitamins-Minerals (MULTIVITAMIN WITH MINERALS) tablet Take 1 tablet by mouth daily.    Marland Kitchen oseltamivir (TAMIFLU) 75 MG capsule Take 1 capsule (75 mg total) by mouth daily. (Patient not taking: Reported on 09/03/2017) 10 capsule 0   No facility-administered medications prior to visit.     Review of Systems;  Patient denies headache, fevers, malaise, unintentional weight loss, skin rash, eye pain, sinus congestion and sinus pain, sore throat, dysphagia,  hemoptysis , cough, dyspnea, wheezing, chest pain, palpitations, orthopnea, edema, abdominal pain, nausea, melena, diarrhea, constipation, flank pain, dysuria, hematuria, urinary  Frequency, nocturia, numbness, tingling, seizures,  Focal weakness, Loss of consciousness,  Tremor, insomnia, depression, anxiety, and suicidal ideation.      Objective:  BP 126/68 (BP Location: Left Arm, Patient Position: Sitting, Cuff Size: Normal)   Pulse 65   Temp 98.1 F (36.7 C) (Oral)   Resp 15   Ht '5\' 5"'  (1.651 m)   Wt 146 lb (66.2 kg)  SpO2 98%   BMI 24.30 kg/m   BP Readings from Last 3 Encounters:  09/03/17 126/68  09/03/17 126/68  07/10/17 (!) 155/71    Wt Readings from Last 3 Encounters:  09/03/17 146 lb (66.2 kg)  09/03/17 146 lb (66.2 kg)  07/10/17 147 lb (66.7 kg)    General appearance: alert, cooperative and appears stated age Ears: normal TM's and external ear canals both ears Throat: lips, mucosa, and tongue normal; teeth and gums normal Neck: no adenopathy, no carotid bruit, supple, symmetrical, trachea midline and thyroid not enlarged, symmetric, no tenderness/mass/nodules Back: symmetric, no curvature. ROM normal. No CVA tenderness. Lungs: clear to auscultation bilaterally Heart: regular rate and rhythm, S1, S2 normal, no murmur,  click, rub or gallop Abdomen: soft, non-tender; bowel sounds normal; no masses,  no organomegaly Pulses: 2+ and symmetric Skin: Skin color, texture, turgor normal. No rashes or lesions Lymph nodes: Cervical, supraclavicular, and axillary nodes normal.  Lab Results  Component Value Date   HGBA1C 5.6 09/05/2014    Lab Results  Component Value Date   CREATININE 0.46 07/10/2017   CREATININE 0.6 06/06/2016   CREATININE 0.61 11/10/2015    Lab Results  Component Value Date   WBC 7.5 07/10/2017   HGB 14.1 07/10/2017   HCT 42.0 07/10/2017   PLT 324 07/10/2017   GLUCOSE 111 (H) 07/10/2017   CHOL 181 08/28/2015   TRIG 83.0 08/28/2015   HDL 62.90 08/28/2015   LDLCALC 102 (H) 08/28/2015   ALT 14 09/20/2016   AST 20 09/20/2016   NA 140 07/10/2017   K 4.3 07/10/2017   CL 103 07/10/2017   CREATININE 0.46 07/10/2017   BUN 19 07/10/2017   CO2 27 07/10/2017   TSH 2.31 09/20/2016   INR 0.9 08/08/2011   HGBA1C 5.6 09/05/2014    Dg Chest 2 View  Result Date: 07/10/2017 CLINICAL DATA:  Sudden onset left-sided chest pain. EXAM: CHEST  2 VIEW COMPARISON:  Chest x-ray dated February 24, 2017. Chest x-ray dated Sep 12, 2016. FINDINGS: The heart size and mediastinal contours are within normal limits. Normal pulmonary vascularity. Minimal bibasilar atelectasis. No focal consolidation, pleural effusion, or pneumothorax. No acute osseous abnormality. Unchanged mild superior endplate compression deformity at the thoracolumbar junction. IMPRESSION: No active cardiopulmonary disease. Electronically Signed   By: Titus Dubin M.D.   On: 07/10/2017 17:32    Assessment & Plan:   Problem List Items Addressed This Visit    Breast cancer screening - Primary   Relevant Orders   MM 3D SCREEN BREAST BILATERAL   Irritable bowel syndrome (IBS)    Complicated by possible lactose intolerance.  Diet reviewed, recommendations made.      Generalized anxiety disorder    Aggravated by increasing concern about  her grown son who is battling MS.  Recommended psychotherapy/counselling , offered referral .  A total of 25 minutes of face to face time was spent with patient more than half of which was spent in counselling about the above mentioned conditions  and coordination of care       Frequent falls    Secondary to poor balance and decreased safety awareness . Encouraged her to resume PT and Tai chi.         I have discontinued Tayra Dawe. Groeneveld's oseltamivir. I am also having her maintain her folic acid, hydroxychloroquine, aspirin, calcium-vitamin D, multivitamin with minerals, methotrexate, alendronate, and (Lactobacillus Rhamnosus, GG, (CULTURELLE PO)).  No orders of the defined types were placed in this encounter.  Medications Discontinued During This Encounter  Medication Reason  . oseltamivir (TAMIFLU) 75 MG capsule Prescription never filled    Follow-up: Return in about 6 months (around 03/05/2018).   Crecencio Mc, MD

## 2017-09-03 NOTE — Patient Instructions (Addendum)
  Teresa Lin , Thank you for taking time to come for your Medicare Wellness Visit. I appreciate your ongoing commitment to your health goals. Please review the following plan we discussed and let me know if I can assist you in the future.   Follow up as needed.    Bring a copy of your [place] and/or Living Will to be scanned into chart.  Have a great day!  These are the goals we discussed: Goals    . Increase physical activity     Core strengthening exercises.  Educational material provided.       This is a list of the screening recommended for you and due dates:  Health Maintenance  Topic Date Due  . Flu Shot  12/11/2017  . Tetanus Vaccine  09/14/2023  . DEXA scan (bone density measurement)  Completed  . Pneumonia vaccines  Completed

## 2017-09-03 NOTE — Progress Notes (Signed)
Subjective:   Teresa Lin is a 84 y.o. female who presents for Medicare Annual (Subsequent) preventive examination.  Review of Systems:  No ROS.  Medicare Wellness Visit. Additional risk factors are reflected in the social history. Cardiac Risk Factors include: advanced age (>43men, >44 women)     Objective:     Vitals: BP 126/68 (BP Location: Left Arm, Patient Position: Sitting, Cuff Size: Normal)   Pulse 65   Temp 98.1 F (36.7 C) (Oral)   Resp 14   Ht 5\' 5"  (1.651 m)   Wt 146 lb (66.2 kg)   SpO2 95%   BMI 24.30 kg/m   Body mass index is 24.3 kg/m.  Advanced Directives 09/03/2017 07/10/2017 08/30/2016 11/10/2015 10/13/2015  Does Patient Have a Medical Advance Directive? Yes No Yes Yes Yes  Type of Paramedic of [place];Living will - [place];Living will [place];Living will [place];Living will  Does patient want to make changes to medical advance directive? No - Patient declined - No - Patient declined - -  Copy of [place] in Chart? No - copy requested - No - copy requested No - copy requested -  Would patient like information on creating a medical advance directive? - No - Patient declined - - -    Tobacco Social History   Tobacco Use  Smoking Status Never Smoker  Smokeless Tobacco Never Used     Counseling given: Not Answered   Clinical Intake:  Pre-visit preparation completed: Yes  Pain : No/denies pain     Nutritional Status: BMI of 19-24  Normal Diabetes: No  How often do you need to have someone help you when you read instructions, pamphlets, or other written materials from your doctor or pharmacy?: 1 - Never  Interpreter Needed?: No     Past Medical History:  Diagnosis Date  . Arthritis   . Collagen vascular disease ([place])   . Colon polyps   . Phlebitis and thrombophlebitis of deep veins of lower extremities ([place])    after surgery   Past  Surgical History:  Procedure Laterality Date  . APPENDECTOMY    . JOINT REPLACEMENT Bilateral    knee replacement   Family History  Problem Relation Age of Onset  . Hypertension Mother   . Cancer Mother        colon ca  . Heart attack Mother   . Hypertension Father   . Heart attack Father   . Hypertension Sister   . Heart attack Sister   . Multiple sclerosis Daughter   . Multiple sclerosis Son   . Multiple sclerosis Son    Social History   Socioeconomic History  . Marital status: Widowed    Spouse name: Not on file  . Number of children: Not on file  . Years of education: Not on file  . Highest education level: Not on file  Occupational History  . Not on file  Social Needs  . Financial resource strain: Not hard at all  . Food insecurity:    Worry: Never true    Inability: Never true  . Transportation needs:    Medical: No    Non-medical: No  Tobacco Use  . Smoking status: Never Smoker  . Smokeless tobacco: Never Used  Substance and Sexual Activity  . Alcohol use: Yes    Alcohol/week: 1.2 oz    Types: 2 Cans of beer per week    Comment: occasionally   .  Drug use: No  . Sexual activity: Never  Lifestyle  . Physical activity:    Days per week: 3 days    Minutes per session: 60 min  . Stress: Only a little  Relationships  . Social connections:    Talks on phone: Not on file    Gets together: Not on file    Attends religious service: Not on file    Active member of club or organization: Not on file    Attends meetings of clubs or organizations: Not on file    Relationship status: Not on file  Other Topics Concern  . Not on file  Social History Narrative  . Not on file    Outpatient Encounter Medications as of 09/03/2017  Medication Sig  . alendronate (FOSAMAX) 70 MG tablet Take by mouth. Reported on 11/13/2015  . aspirin 81 MG tablet Take 81 mg by mouth daily.  . calcium-vitamin D (OSCAL WITH D) 500-200 MG-UNIT per tablet Take 1 tablet by mouth. Reported  on 05/18/1094  . folic acid (FOLVITE) 1 MG tablet Take 1 mg by mouth daily.   . hydroxychloroquine (PLAQUENIL) 200 MG tablet Take 200 mg by mouth daily.  . Lactobacillus Rhamnosus, GG, (CULTURELLE PO) Take 1 tablet by mouth daily. Probiotic  . methotrexate 50 MG/2ML injection Inject into the skin once a week.   . Multiple Vitamins-Minerals (MULTIVITAMIN WITH MINERALS) tablet Take 1 tablet by mouth daily.   No facility-administered encounter medications on file as of 09/03/2017.     Activities of Daily Living In your present state of health, do you have any difficulty performing the following activities: 09/03/2017  Hearing? Y  Comment Hearing aids  Vision? N  Difficulty concentrating or making decisions? N  Walking or climbing stairs? Y  Comment States she is uncomfortable with foot placement most of the time.  Unsteady gait.  Dressing or bathing? N  Doing errands, shopping? N  Preparing Food and eating ? N  Using the Toilet? N  In the past six months, have you accidently leaked urine? N  Do you have problems with loss of bowel control? N  Managing your Medications? N  Managing your Finances? N  Housekeeping or managing your Housekeeping? N  Some recent data might be hidden    Patient Care Team: Crecencio Mc, MD as PCP - General (Internal Medicine)    Assessment:   This is a routine wellness examination for Teresa Lin.  The goal of the wellness visit is to assist the patient how to close the gaps in care and create a preventative care plan for the patient.   The roster of all physicians providing medical care to patient is listed in the Snapshot section of the chart.  Taking calcium, fosamax as appropriate/Osteoporosis reviewed.    Safety issues reviewed; lives alone. Life alert, smoke and carbon monoxide detectors in the home. No firearms or firearms locked in a safe within the home. Wears seatbelts when driving or riding with others. No violence in the home.  They do not have  excessive sun exposure.  Discussed the need for sun protection: hats, long sleeves and the use of sunscreen if there is significant sun exposure.  Patient is alert, normal appearance, oriented to person/place/and time.  Correctly identified the president of the [place] and recalls of 3/3 words. Performs simple calculations and can read correct time from watch face. Displays appropriate judgement.  No new identified risk were noted.  No failures at ADL's or IADL's.  BMI- discussed the importance of a healthy diet, water intake and the benefits of aerobic exercise. Educational material provided.   24 hour diet recall: Regular diet  Dental- every 6 months.  Eye- Visual acuity not assessed per patient preference since they have regular follow up with the ophthalmologist.  Wears corrective lenses.  Sleep patterns- Sleeps 5 hours at night, plans to begin taking OTC melatonin.  Health maintenance gaps- closed.  Patient Concerns: None at this time. Follow up with PCP as needed.  Exercise Activities and Dietary recommendations Current Exercise Habits: Structured exercise class, Type of exercise: calisthenics(Water aerobics), Time (Minutes): 60, Intensity: Mild  Goals    . Increase physical activity     Core strengthening exercises.  Educational material provided.       Fall Risk Fall Risk  09/03/2017 09/03/2017 08/30/2016 08/02/2016 12/26/2015  Falls in the past year? Yes Yes Yes Yes Yes  Number falls in past yr: 2 or more 2 or more 2 or more 2 or more 2 or more  Comment - - - - -  Injury with Fall? No Yes Yes Yes Yes  Comment - - - - -  Risk Factor Category  High Fall Risk - High Fall Risk High Fall Risk -  Comment - - States she is always in a hurry and trips over items. - -  Risk for fall due to : History of fall(s) - History of fall(s) - -  Risk for fall due to: Comment - - - - -  Follow up Falls prevention discussed;Education provided - Falls prevention discussed;Education  provided - -   Depression Screen PHQ 2/9 Scores 09/03/2017 09/03/2017 08/30/2016 12/26/2015  PHQ - 2 Score 0 0 0 -  PHQ- 9 Score - 4 - -  Exception Documentation - - - Patient refusal     Cognitive Function MMSE - Mini Mental State Exam 08/30/2016  Orientation to time 5  Orientation to Place 5  Registration 3  Attention/ Calculation 5  Recall 3  Language- name 2 objects 2  Language- repeat 1  Language- follow 3 step command 3  Language- read & follow direction 1  Write a sentence 1  Copy design 1  Total score 30     6CIT Screen 09/03/2017  What Year? 0 points  What month? 0 points  What time? 0 points  Count back from 20 0 points  Months in reverse 0 points  Repeat phrase 0 points  Total Score 0    Immunization History  Administered Date(s) Administered  . Influenza Split 04/20/2013  . Influenza, High Dose Seasonal PF 03/24/2015, 04/08/2016, 03/05/2017  . PPD Test 01/10/2014  . Pneumococcal Conjugate-13 09/28/2014  . Pneumococcal Polysaccharide-23 04/20/2013  . Tdap 09/13/2013   Screening Tests Health Maintenance  Topic Date Due  . INFLUENZA VACCINE  12/11/2017  . TETANUS/TDAP  09/14/2023  . DEXA SCAN  Completed  . PNA vac Low Risk Adult  Completed      Plan:    End of life planning; Advance aging; Advanced directives discussed. Copy of current HCPOA/Living Will requested.    I have personally reviewed and noted the following in the patient's chart:   . Medical and social history . Use of alcohol, tobacco or illicit drugs  . Current medications and supplements . Functional ability and status . Nutritional status . Physical activity . Advanced directives . List of other physicians . Hospitalizations, surgeries, and ER visits in previous 12 months . Vitals . Screenings to include cognitive,  depression, and falls . Referrals and appointments  In addition, I have reviewed and discussed with patient certain preventive protocols, quality metrics, and best  practice recommendations. A written personalized care plan for preventive services as well as general preventive health recommendations were provided to patient.     Varney Biles, LPN  4/66/5993

## 2017-09-06 DIAGNOSIS — F411 Generalized anxiety disorder: Secondary | ICD-10-CM | POA: Insufficient documentation

## 2017-09-06 DIAGNOSIS — K589 Irritable bowel syndrome without diarrhea: Secondary | ICD-10-CM | POA: Insufficient documentation

## 2017-09-06 NOTE — Assessment & Plan Note (Signed)
Complicated by possible lactose intolerance.  Diet reviewed, recommendations made.

## 2017-09-06 NOTE — Assessment & Plan Note (Signed)
Secondary to poor balance and decreased safety awareness . Encouraged her to resume PT and Tai chi.

## 2017-09-06 NOTE — Assessment & Plan Note (Signed)
Aggravated by increasing concern about her grown son who is battling MS.  Recommended psychotherapy/counselling , offered referral .  A total of 25 minutes of face to face time was spent with patient more than half of which was spent in counselling about the above mentioned conditions  and coordination of care

## 2017-09-18 DIAGNOSIS — L818 Other specified disorders of pigmentation: Secondary | ICD-10-CM | POA: Diagnosis not present

## 2017-09-18 DIAGNOSIS — D0439 Carcinoma in situ of skin of other parts of face: Secondary | ICD-10-CM | POA: Diagnosis not present

## 2017-10-10 DIAGNOSIS — Z79899 Other long term (current) drug therapy: Secondary | ICD-10-CM | POA: Diagnosis not present

## 2017-10-10 DIAGNOSIS — M1990 Unspecified osteoarthritis, unspecified site: Secondary | ICD-10-CM | POA: Diagnosis not present

## 2017-10-23 ENCOUNTER — Encounter: Payer: Self-pay | Admitting: *Deleted

## 2017-10-24 ENCOUNTER — Encounter: Payer: Self-pay | Admitting: *Deleted

## 2017-10-24 ENCOUNTER — Ambulatory Visit: Payer: Medicare Other | Admitting: Anesthesiology

## 2017-10-24 ENCOUNTER — Ambulatory Visit
Admission: RE | Admit: 2017-10-24 | Discharge: 2017-10-24 | Disposition: A | Discharge status: Home / Self Care | Payer: Medicare Other | Source: Ambulatory Visit | Attending: Unknown Physician Specialty | Admitting: Unknown Physician Specialty

## 2017-10-24 ENCOUNTER — Encounter
Admission: RE | Disposition: A | Discharge status: Home / Self Care | Payer: Self-pay | Source: Ambulatory Visit | Attending: Unknown Physician Specialty

## 2017-10-24 DIAGNOSIS — K635 Polyp of colon: Secondary | ICD-10-CM | POA: Diagnosis not present

## 2017-10-24 DIAGNOSIS — Z79899 Other long term (current) drug therapy: Secondary | ICD-10-CM | POA: Insufficient documentation

## 2017-10-24 DIAGNOSIS — Z888 Allergy status to other drugs, medicaments and biological substances status: Secondary | ICD-10-CM | POA: Insufficient documentation

## 2017-10-24 DIAGNOSIS — D126 Benign neoplasm of colon, unspecified: Secondary | ICD-10-CM | POA: Diagnosis not present

## 2017-10-24 DIAGNOSIS — K219 Gastro-esophageal reflux disease without esophagitis: Secondary | ICD-10-CM | POA: Diagnosis not present

## 2017-10-24 DIAGNOSIS — Z1211 Encounter for screening for malignant neoplasm of colon: Secondary | ICD-10-CM | POA: Diagnosis not present

## 2017-10-24 DIAGNOSIS — R194 Change in bowel habit: Secondary | ICD-10-CM | POA: Diagnosis not present

## 2017-10-24 DIAGNOSIS — Z86718 Personal history of other venous thrombosis and embolism: Secondary | ICD-10-CM | POA: Diagnosis not present

## 2017-10-24 DIAGNOSIS — Z7982 Long term (current) use of aspirin: Secondary | ICD-10-CM | POA: Insufficient documentation

## 2017-10-24 DIAGNOSIS — K640 First degree hemorrhoids: Secondary | ICD-10-CM | POA: Insufficient documentation

## 2017-10-24 DIAGNOSIS — Z800 Family history of malignant neoplasm of digestive organs: Secondary | ICD-10-CM | POA: Diagnosis not present

## 2017-10-24 DIAGNOSIS — M1990 Unspecified osteoarthritis, unspecified site: Secondary | ICD-10-CM | POA: Insufficient documentation

## 2017-10-24 DIAGNOSIS — D122 Benign neoplasm of ascending colon: Secondary | ICD-10-CM | POA: Insufficient documentation

## 2017-10-24 DIAGNOSIS — K5730 Diverticulosis of large intestine without perforation or abscess without bleeding: Secondary | ICD-10-CM | POA: Diagnosis not present

## 2017-10-24 DIAGNOSIS — I10 Essential (primary) hypertension: Secondary | ICD-10-CM | POA: Insufficient documentation

## 2017-10-24 DIAGNOSIS — D374 Neoplasm of uncertain behavior of colon: Secondary | ICD-10-CM | POA: Diagnosis not present

## 2017-10-24 DIAGNOSIS — K5790 Diverticulosis of intestine, part unspecified, without perforation or abscess without bleeding: Secondary | ICD-10-CM | POA: Diagnosis not present

## 2017-10-24 DIAGNOSIS — Z96653 Presence of artificial knee joint, bilateral: Secondary | ICD-10-CM | POA: Insufficient documentation

## 2017-10-24 DIAGNOSIS — K649 Unspecified hemorrhoids: Secondary | ICD-10-CM | POA: Diagnosis not present

## 2017-10-24 DIAGNOSIS — I739 Peripheral vascular disease, unspecified: Secondary | ICD-10-CM | POA: Diagnosis not present

## 2017-10-24 HISTORY — DX: Acute embolism and thrombosis of unspecified deep veins of unspecified lower extremity: I82.409

## 2017-10-24 HISTORY — DX: Anemia, unspecified: D64.9

## 2017-10-24 HISTORY — DX: Unspecified visual disturbance: H53.9

## 2017-10-24 HISTORY — DX: Essential (primary) hypertension: I10

## 2017-10-24 HISTORY — DX: Diverticulosis of intestine, part unspecified, without perforation or abscess without bleeding: K57.90

## 2017-10-24 HISTORY — DX: Gastro-esophageal reflux disease without esophagitis: K21.9

## 2017-10-24 HISTORY — PX: COLONOSCOPY WITH PROPOFOL: SHX5780

## 2017-10-24 SURGERY — COLONOSCOPY WITH PROPOFOL
Anesthesia: General

## 2017-10-24 MED ORDER — LIDOCAINE HCL (PF) 2 % IJ SOLN
INTRAMUSCULAR | Status: DC | PRN
  Administered 2017-10-24: 50 mg

## 2017-10-24 MED ORDER — LIDOCAINE HCL (PF) 2 % IJ SOLN
INTRAMUSCULAR | Status: AC
  Filled 2017-10-24: qty 10

## 2017-10-24 MED ORDER — PROPOFOL 10 MG/ML IV BOLUS
INTRAVENOUS | Status: DC | PRN
  Administered 2017-10-24: 10 mg via INTRAVENOUS
  Administered 2017-10-24 (×2): 20 mg via INTRAVENOUS

## 2017-10-24 MED ORDER — PROPOFOL 500 MG/50ML IV EMUL
INTRAVENOUS | Status: DC | PRN
  Administered 2017-10-24: 25 ug/kg/min via INTRAVENOUS

## 2017-10-24 MED ORDER — SODIUM CHLORIDE 0.9 % IV SOLN
INTRAVENOUS | Status: DC
  Administered 2017-10-24: 1000 mL via INTRAVENOUS

## 2017-10-24 MED ORDER — PIPERACILLIN-TAZOBACTAM 3.375 G IVPB
INTRAVENOUS | Status: AC
  Administered 2017-10-24: 3.375 g via INTRAVENOUS
  Filled 2017-10-24: qty 50

## 2017-10-24 MED ORDER — PHENYLEPHRINE HCL 10 MG/ML IJ SOLN
INTRAMUSCULAR | Status: DC | PRN
  Administered 2017-10-24: 100 ug via INTRAVENOUS

## 2017-10-24 MED ORDER — MIDAZOLAM HCL 2 MG/2ML IJ SOLN
INTRAMUSCULAR | Status: AC
  Filled 2017-10-24: qty 2

## 2017-10-24 MED ORDER — PIPERACILLIN-TAZOBACTAM 3.375 G IVPB 30 MIN
3.3750 g | Freq: Once | INTRAVENOUS | Status: AC
Start: 2017-10-24 — End: 2017-10-24
  Administered 2017-10-24: 3.375 g via INTRAVENOUS
  Filled 2017-10-24: qty 50

## 2017-10-24 MED ORDER — FENTANYL CITRATE (PF) 100 MCG/2ML IJ SOLN
INTRAMUSCULAR | Status: AC
  Filled 2017-10-24: qty 2

## 2017-10-24 MED ORDER — FENTANYL CITRATE (PF) 100 MCG/2ML IJ SOLN
INTRAMUSCULAR | Status: DC | PRN
  Administered 2017-10-24 (×4): 25 ug via INTRAVENOUS

## 2017-10-24 MED ORDER — MIDAZOLAM HCL 5 MG/5ML IJ SOLN
INTRAMUSCULAR | Status: DC | PRN
  Administered 2017-10-24: 1 mg via INTRAVENOUS

## 2017-10-24 NOTE — H&P (Signed)
Primary Care Physician:  Crecencio Mc, MD Primary Gastroenterologist:  Dr. Vira Agar  Pre-Procedure History & Physical: HPI:  Teresa Lin is a 84 y.o. female is here for an colonoscopy. FH colon cancer.   Past Medical History:  Diagnosis Date  . Anemia   . Arthritis    osteoarthritis. Bilateral knee replacement, hands, meniscal tear, cervical disc disease  . Collagen vascular disease ([place])   . Colon polyps   . Diverticulosis   . DVT (deep venous thrombosis) ([place])   . GERD (gastroesophageal reflux disease)   . Hypertension   . Phlebitis and thrombophlebitis of deep veins of lower extremities (HCC)    after surgery  . Vision abnormalities     Past Surgical History:  Procedure Laterality Date  . APPENDECTOMY    . DILATION AND CURETTAGE OF UTERUS    . EYE SURGERY     bilateral catarACT  . JOINT REPLACEMENT Bilateral    knee replacement  . TONSILLECTOMY      Prior to Admission medications   Medication Sig Start Date End Date Taking? Authorizing Provider  alendronate (FOSAMAX) 70 MG tablet Take by mouth. Reported on 11/13/2015 06/14/15  Yes [provider]  aspirin 81 MG tablet Take 81 mg by mouth daily.   Yes [provider]  calcium-vitamin D (OSCAL WITH D) 500-200 MG-UNIT per tablet Take 1 tablet by mouth. Reported on 11/13/2015   Yes [provider]  folic acid (FOLVITE) 1 MG tablet Take 1 mg by mouth daily.    Yes [provider]  hydroxychloroquine (PLAQUENIL) 200 MG tablet Take 200 mg by mouth daily.   Yes [provider]  Lactobacillus Rhamnosus, GG, (CULTURELLE PO) Take 1 tablet by mouth daily. Probiotic   Yes [provider]  methotrexate 50 MG/2ML injection Inject into the skin once a week.  03/16/15  Yes [provider]  Multiple Vitamins-Minerals (MULTIVITAMIN WITH MINERALS) tablet Take 1 tablet by mouth daily.   Yes [provider]    Allergies as of 09/04/2017 - Review Complete 09/03/2017   Allergen Reaction Noted  . Infliximab Other (See Comments) 01/10/2014  . Synvisc [hylan g-f 20] Swelling 03/02/2013    Family History  Problem Relation Age of Onset  . Hypertension Mother   . Cancer Mother        colon ca  . Heart attack Mother   . Hypertension Father   . Heart attack Father   . Hypertension Sister   . Heart attack Sister   . Multiple sclerosis Daughter   . Multiple sclerosis Son   . Multiple sclerosis Son     Social History   Socioeconomic History  . Marital status: Widowed    Spouse name: Not on file  . Number of children: Not on file  . Years of education: Not on file  . Highest education level: Not on file  Occupational History  . Not on file  Social Needs  . Financial resource strain: Not hard at all  . Food insecurity:    Worry: Never true    Inability: Never true  . Transportation needs:    Medical: No    Non-medical: No  Tobacco Use  . Smoking status: Never Smoker  . Smokeless tobacco: Never Used  Substance and Sexual Activity  . Alcohol use: Yes    Alcohol/week: 1.2 oz    Types: 2 Cans of beer per week    Comment: occasionally   . Drug use: No  . Sexual  activity: Never  Lifestyle  . Physical activity:    Days per week: 3 days    Minutes per session: 60 min  . Stress: Only a little  Relationships  . Social connections:    Talks on phone: Not on file    Gets together: Not on file    Attends religious service: Not on file    Active member of club or organization: Not on file    Attends meetings of clubs or organizations: Not on file    Relationship status: Not on file  . Intimate partner violence:    Fear of current or ex partner: Not on file    Emotionally abused: Not on file    Physically abused: Not on file    Forced sexual activity: Not on file  Other Topics Concern  . Not on file  Social History Narrative  . Not on file    Review of Systems: See HPI, otherwise negative ROS  Physical Exam: BP 137/69   Pulse 95    Temp (!) 96.4 F (35.8 C) (Tympanic)   Resp 18   Ht 5\' 5"  (1.651 m)   Wt 62.6 kg (138 lb)   SpO2 99%   BMI 22.96 kg/m  General:   Alert,  pleasant and cooperative in NAD Head:  Normocephalic and atraumatic. Neck:  Supple; no masses or thyromegaly. Lungs:  Clear throughout to auscultation.    Heart:  Regular rate and rhythm. Abdomen:  Soft, nontender and nondistended. Normal bowel sounds, without guarding, and without rebound.   Neurologic:  Alert and  oriented x4;  grossly normal neurologically.  Impression/Plan: Teresa Lin is here for an colonoscopy to be performed for Family history of colon cancer.  Risks, benefits, limitations, and alternatives regarding  colonoscopy have been reviewed with the patient.  Questions have been answered.  All parties agreeable.   Gaylyn Cheers, MD  10/24/2017, 11:53 AM

## 2017-10-24 NOTE — Anesthesia Post-op Follow-up Note (Signed)
Anesthesia QCDR form completed.        

## 2017-10-24 NOTE — Transfer of Care (Signed)
Immediate Anesthesia Transfer of Care Note  Patient: Teresa Lin  Procedure(s) Performed: COLONOSCOPY WITH PROPOFOL (N/A )  Patient Location: PACU  Anesthesia Type:General  Level of Consciousness: sedated  Airway & Oxygen Therapy: Patient Spontanous Breathing and Patient connected to nasal cannula oxygen  Post-op Assessment: Report given to RN and Post -op Vital signs reviewed and stable  Post vital signs: Reviewed and stable  Last Vitals:  Vitals Value Taken Time  BP    Temp    Pulse 76 10/24/2017 12:40 PM  Resp 14 10/24/2017 12:40 PM  SpO2 99 % 10/24/2017 12:40 PM  Vitals shown include unvalidated device data.  Last Pain:  Vitals:   10/24/17 1240  TempSrc: (P) Tympanic  PainSc:       Patients Stated Pain Goal: 0 (45/62/56 3893)  Complications: No apparent anesthesia complications

## 2017-10-24 NOTE — Anesthesia Preprocedure Evaluation (Signed)
Anesthesia Evaluation  Patient identified by MRN, date of birth, ID band Patient awake    Reviewed: Allergy & Precautions, H&P , NPO status , Patient's Chart, lab work & pertinent test results, reviewed documented beta blocker date and time   Airway Mallampati: II   Neck ROM: full    Dental  (+) Poor Dentition   Pulmonary neg pulmonary ROS,    Pulmonary exam normal        Cardiovascular Exercise Tolerance: Good hypertension, On Medications + Peripheral Vascular Disease  negative cardio ROS Normal cardiovascular exam Rhythm:regular Rate:Normal     Neuro/Psych PSYCHIATRIC DISORDERS Anxiety negative neurological ROS  negative psych ROS   GI/Hepatic negative GI ROS, Neg liver ROS, GERD  Medicated,  Endo/Other  negative endocrine ROS  Renal/GU negative Renal ROS  negative genitourinary   Musculoskeletal   Abdominal   Peds  Hematology negative hematology ROS (+) anemia ,   Anesthesia Other Findings Past Medical History: No date: Anemia No date: Arthritis     Comment:  osteoarthritis. Bilateral knee replacement, hands,               meniscal tear, cervical disc disease No date: Collagen vascular disease (HCC) No date: Colon polyps No date: Diverticulosis No date: DVT (deep venous thrombosis) (HCC) No date: GERD (gastroesophageal reflux disease) No date: Hypertension No date: Phlebitis and thrombophlebitis of deep veins of lower  extremities (HCC)     Comment:  after surgery No date: Vision abnormalities Past Surgical History: No date: APPENDECTOMY No date: DILATION AND CURETTAGE OF UTERUS No date: EYE SURGERY     Comment:  bilateral catarACT No date: JOINT REPLACEMENT; Bilateral     Comment:  knee replacement No date: TONSILLECTOMY BMI    Body Mass Index:  22.96 kg/m     Reproductive/Obstetrics negative OB ROS                             Anesthesia Physical Anesthesia  Plan  ASA: III  Anesthesia Plan: General   Post-op Pain Management:    Induction:   PONV Risk Score and Plan:   Airway Management Planned:   Additional Equipment:   Intra-op Plan:   Post-operative Plan:   Informed Consent: I have reviewed the patients History and Physical, chart, labs and discussed the procedure including the risks, benefits and alternatives for the proposed anesthesia with the patient or authorized representative who has indicated his/her understanding and acceptance.   Dental Advisory Given  Plan Discussed with: CRNA  Anesthesia Plan Comments:         Anesthesia Quick Evaluation

## 2017-10-24 NOTE — Op Note (Signed)
[facility] Gastroenterology Patient Name: Teresa Lin Procedure Date: 10/24/2017 11:53 AM MRN: [id number] Account #: [id number] Date of Birth: [birth year] Admit Type: Outpatient Age: 84 Room: [facility] ENDO ROOM 3 Gender: Female Note Status: Finalized Procedure:            Colonoscopy Indications:          Screening for colorectal malignant neoplasm Providers:            Manya Silvas, MD Referring MD:         Deborra Medina, MD (Referring MD) Medicines:            Propofol per Anesthesia Complications:        No immediate complications. Procedure:            Pre-Anesthesia Assessment:                       - After reviewing the risks and benefits, the patient                        was deemed in satisfactory condition to undergo the                        procedure.                       After obtaining informed consent, the colonoscope was                        passed under direct vision. Throughout the procedure,                        the patient's blood pressure, pulse, and oxygen                        saturations were monitored continuously. The                        Colonoscope was introduced through the anus and                        advanced to the the cecum, identified by appendiceal                        orifice and ileocecal valve. The colonoscopy was                        performed with difficulty due to a redundant colon,                        significant looping and a tortuous colon. Successful                        completion of the procedure was aided by changing                        endoscopes. The patient tolerated the procedure well.                        The quality of the bowel preparation was good. Findings:      A medium polyp was found in the ascending colon. The  polyp was sessile.       The polyp was removed with a hot snare. Resection and retrieval were       complete.      Many small-mouthed diverticula were found in the sigmoid  colon and       descending colon.      Internal hemorrhoids were found during endoscopy. The hemorrhoids were       small and Grade I (internal hemorrhoids that do not prolapse).      The exam was otherwise without abnormality. Impression:           - One medium polyp in the ascending colon, removed with                        a hot snare. Resected and retrieved.                       - Diverticulosis in the sigmoid colon and in the                        descending colon.                       - Internal hemorrhoids.                       - The examination was otherwise normal. Recommendation:       - Await pathology results. Manya Silvas, MD 10/24/2017 12:40:30 PM This report has been signed electronically. Number of Addenda: 0 Note Initiated On: 10/24/2017 11:53 AM Scope Withdrawal Time: 0 hours 13 minutes 42 seconds  Total Procedure Duration: 0 hours 37 minutes 23 seconds       [facility]

## 2017-10-29 ENCOUNTER — Encounter: Payer: Self-pay | Admitting: Unknown Physician Specialty

## 2017-10-30 ENCOUNTER — Other Ambulatory Visit: Payer: Self-pay | Admitting: Unknown Physician Specialty

## 2017-10-30 LAB — SURGICAL PATHOLOGY

## 2017-10-30 NOTE — Anesthesia Postprocedure Evaluation (Signed)
Anesthesia Post Note  Patient: ELLENORE ROSCOE  Procedure(s) Performed: COLONOSCOPY WITH PROPOFOL (N/A )  Patient location during evaluation: PACU Anesthesia Type: General Level of consciousness: awake and alert Pain management: pain level controlled Vital Signs Assessment: post-procedure vital signs reviewed and stable Respiratory status: spontaneous breathing, nonlabored ventilation, respiratory function stable and patient connected to nasal cannula oxygen Cardiovascular status: blood pressure returned to baseline and stable Postop Assessment: no apparent nausea or vomiting Anesthetic complications: no     Last Vitals:  Vitals:   10/24/17 1300 10/24/17 1310  BP: (!) 124/94 139/65  Pulse:    Resp:  16  Temp:    SpO2:      Last Pain:  Vitals:   10/24/17 1310  TempSrc:   PainSc: 0-No pain                 Molli Barrows

## 2017-11-03 DIAGNOSIS — M25572 Pain in left ankle and joints of left foot: Secondary | ICD-10-CM | POA: Diagnosis not present

## 2017-11-03 DIAGNOSIS — M0609 Rheumatoid arthritis without rheumatoid factor, multiple sites: Secondary | ICD-10-CM | POA: Diagnosis not present

## 2017-11-19 DIAGNOSIS — M205X2 Other deformities of toe(s) (acquired), left foot: Secondary | ICD-10-CM | POA: Diagnosis not present

## 2017-11-19 DIAGNOSIS — M898X9 Other specified disorders of bone, unspecified site: Secondary | ICD-10-CM | POA: Diagnosis not present

## 2017-11-19 DIAGNOSIS — M2012 Hallux valgus (acquired), left foot: Secondary | ICD-10-CM | POA: Diagnosis not present

## 2017-11-21 ENCOUNTER — Ambulatory Visit: Payer: Medicare Other | Admitting: Anesthesiology

## 2017-11-21 ENCOUNTER — Ambulatory Visit
Admission: RE | Admit: 2017-11-21 | Discharge: 2017-11-21 | Disposition: A | Discharge status: Home / Self Care | Payer: Medicare Other | Source: Ambulatory Visit | Attending: General Surgery | Admitting: General Surgery

## 2017-11-21 ENCOUNTER — Encounter
Admission: RE | Disposition: A | Discharge status: Home / Self Care | Payer: Self-pay | Source: Ambulatory Visit | Attending: Unknown Physician Specialty

## 2017-11-21 ENCOUNTER — Ambulatory Visit
Admission: RE | Admit: 2017-11-21 | Discharge: 2017-11-21 | Disposition: A | Discharge status: Home / Self Care | Payer: Medicare Other | Source: Ambulatory Visit | Attending: Unknown Physician Specialty | Admitting: Unknown Physician Specialty

## 2017-11-21 ENCOUNTER — Encounter: Payer: Self-pay | Admitting: Anesthesiology

## 2017-11-21 ENCOUNTER — Other Ambulatory Visit: Payer: Self-pay

## 2017-11-21 DIAGNOSIS — M19042 Primary osteoarthritis, left hand: Secondary | ICD-10-CM | POA: Insufficient documentation

## 2017-11-21 DIAGNOSIS — Z96653 Presence of artificial knee joint, bilateral: Secondary | ICD-10-CM | POA: Insufficient documentation

## 2017-11-21 DIAGNOSIS — K635 Polyp of colon: Secondary | ICD-10-CM | POA: Insufficient documentation

## 2017-11-21 DIAGNOSIS — Z7982 Long term (current) use of aspirin: Secondary | ICD-10-CM | POA: Insufficient documentation

## 2017-11-21 DIAGNOSIS — Z800 Family history of malignant neoplasm of digestive organs: Secondary | ICD-10-CM | POA: Diagnosis not present

## 2017-11-21 DIAGNOSIS — Z8249 Family history of ischemic heart disease and other diseases of the circulatory system: Secondary | ICD-10-CM | POA: Diagnosis not present

## 2017-11-21 DIAGNOSIS — M069 Rheumatoid arthritis, unspecified: Secondary | ICD-10-CM | POA: Diagnosis not present

## 2017-11-21 DIAGNOSIS — F419 Anxiety disorder, unspecified: Secondary | ICD-10-CM | POA: Diagnosis not present

## 2017-11-21 DIAGNOSIS — M19041 Primary osteoarthritis, right hand: Secondary | ICD-10-CM | POA: Insufficient documentation

## 2017-11-21 DIAGNOSIS — Z79899 Other long term (current) drug therapy: Secondary | ICD-10-CM | POA: Insufficient documentation

## 2017-11-21 DIAGNOSIS — I739 Peripheral vascular disease, unspecified: Secondary | ICD-10-CM | POA: Insufficient documentation

## 2017-11-21 DIAGNOSIS — M359 Systemic involvement of connective tissue, unspecified: Secondary | ICD-10-CM | POA: Insufficient documentation

## 2017-11-21 DIAGNOSIS — Z888 Allergy status to other drugs, medicaments and biological substances status: Secondary | ICD-10-CM | POA: Insufficient documentation

## 2017-11-21 DIAGNOSIS — D069 Carcinoma in situ of cervix, unspecified: Secondary | ICD-10-CM | POA: Diagnosis not present

## 2017-11-21 DIAGNOSIS — D120 Benign neoplasm of cecum: Secondary | ICD-10-CM | POA: Diagnosis not present

## 2017-11-21 DIAGNOSIS — D122 Benign neoplasm of ascending colon: Secondary | ICD-10-CM | POA: Diagnosis not present

## 2017-11-21 DIAGNOSIS — I10 Essential (primary) hypertension: Secondary | ICD-10-CM | POA: Diagnosis not present

## 2017-11-21 DIAGNOSIS — Z86718 Personal history of other venous thrombosis and embolism: Secondary | ICD-10-CM | POA: Insufficient documentation

## 2017-11-21 DIAGNOSIS — K219 Gastro-esophageal reflux disease without esophagitis: Secondary | ICD-10-CM | POA: Diagnosis not present

## 2017-11-21 DIAGNOSIS — Z86010 Personal history of colonic polyps: Secondary | ICD-10-CM | POA: Insufficient documentation

## 2017-11-21 DIAGNOSIS — R109 Unspecified abdominal pain: Secondary | ICD-10-CM | POA: Diagnosis not present

## 2017-11-21 HISTORY — PX: COLONOSCOPY WITH PROPOFOL: SHX5780

## 2017-11-21 SURGERY — COLONOSCOPY WITH PROPOFOL
Anesthesia: General

## 2017-11-21 MED ORDER — PIPERACILLIN-TAZOBACTAM 3.375 G IVPB
INTRAVENOUS | Status: AC
  Filled 2017-11-21: qty 50

## 2017-11-21 MED ORDER — PROPOFOL 10 MG/ML IV BOLUS
INTRAVENOUS | Status: DC | PRN
  Administered 2017-11-21: 20 mg via INTRAVENOUS
  Administered 2017-11-21: 30 mg via INTRAVENOUS

## 2017-11-21 MED ORDER — PIPERACILLIN-TAZOBACTAM 3.375 G IVPB
3.3750 g | Freq: Once | INTRAVENOUS | Status: AC
  Administered 2017-11-21: 3.375 g via INTRAVENOUS

## 2017-11-21 MED ORDER — PROPOFOL 500 MG/50ML IV EMUL
INTRAVENOUS | Status: DC | PRN
  Administered 2017-11-21: 100 ug/kg/min via INTRAVENOUS

## 2017-11-21 MED ORDER — PROPOFOL 500 MG/50ML IV EMUL
INTRAVENOUS | Status: AC
  Filled 2017-11-21: qty 50

## 2017-11-21 MED ORDER — SODIUM CHLORIDE 0.9 % IV SOLN: INTRAVENOUS | Status: DC

## 2017-11-21 MED ORDER — SODIUM CHLORIDE 0.9 % IV SOLN
INTRAVENOUS | Status: DC
  Administered 2017-11-21: 1000 mL via INTRAVENOUS

## 2017-11-21 NOTE — Anesthesia Preprocedure Evaluation (Signed)
Anesthesia Evaluation  Patient identified by MRN, date of birth, ID band Patient awake    Reviewed: Allergy & Precautions, H&P , NPO status , Patient's Chart, lab work & pertinent test results, reviewed documented beta blocker date and time   History of Anesthesia Complications Negative for: history of anesthetic complications  Airway Mallampati: II   Neck ROM: full    Dental  (+) Poor Dentition   Pulmonary neg pulmonary ROS,    Pulmonary exam normal        Cardiovascular Exercise Tolerance: Good hypertension, On Medications + Peripheral Vascular Disease  negative cardio ROS Normal cardiovascular exam Rhythm:regular Rate:Normal     Neuro/Psych PSYCHIATRIC DISORDERS Anxiety negative neurological ROS  negative psych ROS   GI/Hepatic negative GI ROS, Neg liver ROS, GERD  Medicated,  Endo/Other  negative endocrine ROS  Renal/GU negative Renal ROS  negative genitourinary   Musculoskeletal   Abdominal   Peds  Hematology negative hematology ROS (+) anemia ,   Anesthesia Other Findings Past Medical History: No date: Anemia No date: Arthritis     Comment:  osteoarthritis. Bilateral knee replacement, hands,               meniscal tear, cervical disc disease No date: Collagen vascular disease (HCC) No date: Colon polyps No date: Diverticulosis No date: DVT (deep venous thrombosis) (HCC) No date: GERD (gastroesophageal reflux disease) No date: Hypertension No date: Phlebitis and thrombophlebitis of deep veins of lower  extremities (HCC)     Comment:  after surgery No date: Vision abnormalities Past Surgical History: No date: APPENDECTOMY No date: DILATION AND CURETTAGE OF UTERUS No date: EYE SURGERY     Comment:  bilateral catarACT No date: JOINT REPLACEMENT; Bilateral     Comment:  knee replacement No date: TONSILLECTOMY BMI    Body Mass Index:  22.96 kg/m     Reproductive/Obstetrics negative OB ROS                              Anesthesia Physical  Anesthesia Plan  ASA: III  Anesthesia Plan: General   Post-op Pain Management:    Induction: Intravenous  PONV Risk Score and Plan: 3 and Propofol infusion and TIVA  Airway Management Planned: Natural Airway and Nasal Cannula  Additional Equipment:   Intra-op Plan:   Post-operative Plan:   Informed Consent: I have reviewed the patients History and Physical, chart, labs and discussed the procedure including the risks, benefits and alternatives for the proposed anesthesia with the patient or authorized representative who has indicated his/her understanding and acceptance.   Dental Advisory Given  Plan Discussed with: CRNA  Anesthesia Plan Comments:         Anesthesia Quick Evaluation

## 2017-11-21 NOTE — Op Note (Signed)
[facility] Gastroenterology Patient Name: Teresa Lin Procedure Date: 11/21/2017 8:55 AM MRN: [id number] Account #: [id number] Date of Birth: 1931/09/16 Admit Type: Outpatient Age: 84 Room: [facility] ENDO ROOM 1 Gender: Female Note Status: Finalized Procedure:            Colonoscopy Indications:          Therapeutic procedure for colon polyps Providers:            Manya Silvas, MD Referring MD:         Deborra Medina, MD (Referring MD) Medicines:            Propofol per Anesthesia Complications:        No immediate complications. Procedure:            Pre-Anesthesia Assessment:                       - After reviewing the risks and benefits, the patient                        was deemed in satisfactory condition to undergo the                        procedure.                       After obtaining informed consent, the colonoscope was                        passed under direct vision. Throughout the procedure,                        the patient's blood pressure, pulse, and oxygen                        saturations were monitored continuously. The                        Colonoscope was introduced through the anus and                        advanced to the the cecum, identified by appendiceal                        orifice and ileocecal valve. The colonoscopy was                        performed without difficulty. The patient tolerated the                        procedure well. The quality of the bowel preparation                        was excellent. Findings:      A 15-20 mm polyp was found in the mid ascending colon. The polyp was       sessile. A clip was attached from the previous procedure and I could not       remove it. Biopsies were taken with a cold forceps for histology.       Biopsies were taken with a cold forceps for histology. The tissue had       the look of  possible neoplastic tissue and several biopsies done.      Two sessile polyps were found in  the cecum. The polyps were diminutive       in size. These polyps were removed with a jumbo cold forceps. Resection       and retrieval were complete.      A small polyp was found in the distal ascending colon. The polyp was       sessile. The polyp was removed with a hot snare. Resection and retrieval       were complete. Impression:           - One 15-20 mm polyp in the mid ascending colon.                        Biopsied.                       - Two diminutive polyps in the cecum, removed with a                        jumbo cold forceps. Resected and retrieved.                       - One small polyp in the distal ascending colon,                        removed with a hot snare. Resected and retrieved. Recommendation:       - Await pathology results. Manya Silvas, MD 11/21/2017 10:03:28 AM This report has been signed electronically. Number of Addenda: 0 Note Initiated On: 11/21/2017 8:55 AM Scope Withdrawal Time: 0 hours 22 minutes 22 seconds  Total Procedure Duration: 0 hours 36 minutes 18 seconds       [facility]

## 2017-11-21 NOTE — H&P (Signed)
Primary Care Physician:  Crecencio Mc, MD Primary Gastroenterologist:  Dr. Vira Agar  Pre-Procedure History & Physical: HPI:  Teresa Lin is a 84 y.o. female is here for an colonoscopy.  Done for evaluate remnant of polyp with high grade dysplasia.   Past Medical History:  Diagnosis Date  . Anemia   . Arthritis    osteoarthritis. Bilateral knee replacement, hands, meniscal tear, cervical disc disease  . Collagen vascular disease ([place])   . Colon polyps   . Diverticulosis   . DVT (deep venous thrombosis) ([place])   . GERD (gastroesophageal reflux disease)   . Hypertension   . Phlebitis and thrombophlebitis of deep veins of lower extremities (HCC)    after surgery  . Vision abnormalities     Past Surgical History:  Procedure Laterality Date  . APPENDECTOMY    . COLONOSCOPY WITH PROPOFOL N/A 10/24/2017   Procedure: COLONOSCOPY WITH PROPOFOL;  Surgeon: Manya Silvas, MD;  Location: [facility] ENDOSCOPY;  Service: Endoscopy;  Laterality: N/A;  . DILATION AND CURETTAGE OF UTERUS    . EYE SURGERY     bilateral catarACT  . JOINT REPLACEMENT Bilateral    knee replacement  . TONSILLECTOMY      Prior to Admission medications   Medication Sig Start Date End Date Taking? Authorizing Provider  alendronate (FOSAMAX) 70 MG tablet Take by mouth. Reported on 11/13/2015 06/14/15  Yes [provider]  aspirin 81 MG tablet Take 81 mg by mouth daily.   Yes [provider]  calcium-vitamin D (OSCAL WITH D) 500-200 MG-UNIT per tablet Take 1 tablet by mouth. Reported on 11/13/2015   Yes [provider]  folic acid (FOLVITE) 1 MG tablet Take 1 mg by mouth daily.    Yes [provider]  hydroxychloroquine (PLAQUENIL) 200 MG tablet Take 200 mg by mouth daily.   Yes [provider]  Lactobacillus Rhamnosus, GG, (CULTURELLE PO) Take 1 tablet by mouth daily. Probiotic   Yes [provider]  methotrexate 50 MG/2ML injection Inject into the skin once a week.   03/16/15  Yes [provider]  Multiple Vitamins-Minerals (MULTIVITAMIN WITH MINERALS) tablet Take 1 tablet by mouth daily.   Yes [provider]    Allergies as of 11/10/2017 - Review Complete 10/24/2017  Allergen Reaction Noted  . Infliximab Other (See Comments) 01/10/2014  . Synvisc [hylan g-f 20] Swelling 03/02/2013    Family History  Problem Relation Age of Onset  . Hypertension Mother   . Cancer Mother        colon ca  . Heart attack Mother   . Hypertension Father   . Heart attack Father   . Hypertension Sister   . Heart attack Sister   . Multiple sclerosis Daughter   . Multiple sclerosis Son   . Multiple sclerosis Son     Social History   Socioeconomic History  . Marital status: Widowed    Spouse name: Not on file  . Number of children: Not on file  . Years of education: Not on file  . Highest education level: Not on file  Occupational History  . Not on file  Social Needs  . Financial resource strain: Not hard at all  . Food insecurity:    Worry: Never true    Inability: Never true  . Transportation needs:    Medical: No    Non-medical: No  Tobacco Use  . Smoking status: Never Smoker  . Smokeless tobacco: Never Used  Substance and  Sexual Activity  . Alcohol use: Yes    Alcohol/week: 1.2 oz    Types: 2 Cans of beer per week    Comment: occasionally   . Drug use: No  . Sexual activity: Never  Lifestyle  . Physical activity:    Days per week: 3 days    Minutes per session: 60 min  . Stress: Only a little  Relationships  . Social connections:    Talks on phone: Not on file    Gets together: Not on file    Attends religious service: Not on file    Active member of club or organization: Not on file    Attends meetings of clubs or organizations: Not on file    Relationship status: Not on file  . Intimate partner violence:    Fear of current or ex partner: Not on file    Emotionally abused: Not on file    Physically abused: Not on  file    Forced sexual activity: Not on file  Other Topics Concern  . Not on file  Social History Narrative  . Not on file    Review of Systems: See HPI, otherwise negative ROS  Physical Exam: BP 138/73   Pulse 81   Temp (!) 97.4 F (36.3 C) (Tympanic)   Resp 20   Ht 5\' 5"  (1.651 m)   Wt 61.7 kg (136 lb)   SpO2 97%   BMI 22.63 kg/m  General:   Alert,  pleasant and cooperative in NAD Head:  Normocephalic and atraumatic. Neck:  Supple; no masses or thyromegaly. Lungs:  Clear throughout to auscultation.    Heart:  Regular rate and rhythm. Abdomen:  Soft, nontender and nondistended. Normal bowel sounds, without guarding, and without rebound.   Neurologic:  Alert and  oriented x4;  grossly normal neurologically.  Impression/Plan: Teresa Lin is here for an colonoscopy to be performed for Follow up polyp with high grade dysplasia.  Risks, benefits, limitations, and alternatives regarding  colonoscopy have been reviewed with the patient.  Questions have been answered.  All parties agreeable.   Gaylyn Cheers, MD  11/21/2017, 9:06 AM

## 2017-11-21 NOTE — Anesthesia Post-op Follow-up Note (Signed)
Anesthesia QCDR form completed.        

## 2017-11-21 NOTE — Transfer of Care (Signed)
Immediate Anesthesia Transfer of Care Note  Patient: Teresa Lin  Procedure(s) Performed: COLONOSCOPY WITH PROPOFOL (N/A )  Patient Location: PACU and Endoscopy Unit  Anesthesia Type:General  Level of Consciousness: awake, alert  and oriented  Airway & Oxygen Therapy: Patient Spontanous Breathing and Patient connected to nasal cannula oxygen  Post-op Assessment: Report given to RN and Post -op Vital signs reviewed and stable  Post vital signs: Reviewed and stable  Last Vitals:  Vitals Value Taken Time  BP 104/55 11/21/2017  9:55 AM  Temp    Pulse 68 11/21/2017  9:56 AM  Resp 18 11/21/2017  9:56 AM  SpO2 98 % 11/21/2017  9:56 AM  Vitals shown include unvalidated device data.  Last Pain:  Vitals:   11/21/17 0848  TempSrc: Tympanic  PainSc: 3          Complications: No apparent anesthesia complications

## 2017-11-22 NOTE — Anesthesia Postprocedure Evaluation (Signed)
Anesthesia Post Note  Patient: Teresa Lin  Procedure(s) Performed: COLONOSCOPY WITH PROPOFOL (N/A )  Patient location during evaluation: Endoscopy Anesthesia Type: General Level of consciousness: awake and alert Pain management: pain level controlled Vital Signs Assessment: post-procedure vital signs reviewed and stable Respiratory status: spontaneous breathing, nonlabored ventilation, respiratory function stable and patient connected to nasal cannula oxygen Cardiovascular status: blood pressure returned to baseline and stable Postop Assessment: no apparent nausea or vomiting Anesthetic complications: no     Last Vitals:  Vitals:   11/21/17 0955 11/21/17 1005  BP: (!) 104/55 113/71  Pulse: 69 71  Resp: 17 (!) 21  Temp: (!) 36.1 C   SpO2: 98% 99%    Last Pain:  Vitals:   11/21/17 0955  TempSrc: Tympanic  PainSc:                  Martha Clan

## 2017-11-23 ENCOUNTER — Encounter: Payer: Self-pay | Admitting: Unknown Physician Specialty

## 2017-11-24 ENCOUNTER — Encounter: Payer: Self-pay | Admitting: General Surgery

## 2017-11-24 ENCOUNTER — Ambulatory Visit (INDEPENDENT_AMBULATORY_CARE_PROVIDER_SITE_OTHER): Payer: Medicare Other | Admitting: General Surgery

## 2017-11-24 VITALS — BP 120/62 | HR 68 | Resp 14 | Ht 65.0 in | Wt 146.0 lb

## 2017-11-24 DIAGNOSIS — D122 Benign neoplasm of ascending colon: Secondary | ICD-10-CM

## 2017-11-24 LAB — SURGICAL PATHOLOGY

## 2017-11-24 MED ORDER — POLYETHYLENE GLYCOL 3350 17 GM/SCOOP PO POWD: 1.0000 | Freq: Once | ORAL | 0 refills | Status: AC

## 2017-11-24 MED ORDER — NEOMYCIN SULFATE 500 MG PO TABS: 500.0000 mg | ORAL_TABLET | ORAL | 0 refills | Status: DC

## 2017-11-24 MED ORDER — METRONIDAZOLE 500 MG PO TABS: 500.0000 mg | ORAL_TABLET | ORAL | 0 refills | Status: DC

## 2017-11-24 NOTE — Patient Instructions (Signed)
Laparoscopic Colectomy Laparoscopic colectomy is surgery to remove part or all of the large intestine (colon). This procedure may be used to treat several conditions, including:  Inflammation and infection of the colon (diverticulitis).  Tumors or masses in the colon.  Inflammatory bowel disease, such as Crohn disease or ulcerative colitis. Colectomy is an option when symptoms cannot be controlled with medicines.  Bleeding from the colon that cannot be controlled by another method.  Blockage or obstruction of the colon.  Tell a health care provider about:  Any allergies you have.  All medicines you are taking, including vitamins, herbs, eye drops, creams, and over-the-counter medicines.  Any problems you or family members have had with anesthetic medicines.  Any blood disorders you have.  Any surgeries you have had.  Any medical conditions you have. What are the risks? Generally, this is a safe procedure. However, problems may occur, including:  Infection.  Bleeding.  Allergic reactions to medicines or dyes.  Damage to other structures or organs.  Leaking from where the colon was sewn together.  Future blockage of the small intestines from scar tissue. Another surgery may be needed to repair this.  Needing to convert to an open procedure. Complications such as damage to other organs or excessive bleeding may require the surgeon to convert from a laparoscopic procedure to an open procedure. This involves making a larger incision in the abdomen.  What happens before the procedure? Staying hydrated Follow instructions from your health care provider about hydration, which may include:  Up to 2 hours before the procedure - you may continue to drink clear liquids, such as water, clear fruit juice, black coffee, and plain tea.  Eating and drinking restrictions Follow instructions from your health care provider about eating and drinking, which may include:  8 hours before  the procedure - stop eating heavy meals, meals with high fiber, or foods such as meat, fried foods, or fatty foods.  6 hours before the procedure - stop eating light meals or foods, such as toast or cereal.  6 hours before the procedure - stop drinking milk or drinks that contain milk.  2 hours before the procedure - stop drinking clear liquids.  Medicines  Ask your health care provider about: ? Changing or stopping your regular medicines. This is especially important if you are taking diabetes medicines or blood thinners. ? Taking medicines such as aspirin and ibuprofen. These medicines can thin your blood. Do not take these medicines before your procedure if your health care provider instructs you not to.  You may be given antibiotic medicine to clean out bacteria from your colon. Follow the directions carefully and take the medicine at the correct time. General instructions  You may be prescribed an oral bowel prep to clean out your colon in preparation for the surgery: ? Follow instructions from your health care provider about how to do this. ? Do not eat or drink anything else after you have started the bowel prep, unless your health care provider tells you it is safe to do so.  Do not use any products that contain nicotine or tobacco, such as cigarettes and e-cigarettes. If you need help quitting, ask your health care provider. What happens during the procedure?  To reduce your risk of infection: ? Your health care team will wash or sanitize their hands. ? Your skin will be washed with soap.  An IV tube will be inserted into one of your veins to deliver fluid and medication.  You   will be given one of the following: ? A medicine to help you relax (sedative). ? A medicine to make you fall asleep (general anesthetic).  Small monitors will be connected to your body. They will be used to check your heart, blood pressure, and oxygen level.  A breathing tube may be placed into your  lungs during the procedure.  A thin, flexible tube (catheter) will be placed into your bladder to drain urine.  A tube may be placed through your nose and into your stomach to drain stomach fluids (nasogastric tube, or NG tube).  Your abdomen will be filled with air so it expands. This gives the surgeon more room to operate and makes your organs easier to see.  Several small cuts (incisions) will be made in your abdomen.  A thin, lighted tube with a tiny camera on the end (laparoscope) will be put through one of the small incisions. The camera on the laparoscope will send a picture to a computer screen in the operating room. This will give the surgeon a good view inside your abdomen.  Hollow tubes will be put through the other small incisions in your abdomen. The tools that are needed for the procedure will be put through these tubes.  Clamps or staples will be put on both ends of the diseased part of the colon.  The part of the intestine between the clamps or staples will be removed.  If possible, the ends of the healthy colon that remain will be stitched (sutured) or stapled together to allow your body to pass waste (stool).  Sometimes, the remaining colon cannot be stitched back together. If this is the case, a colostomy will be needed. If you need a colostomy: ? An opening to the outside of your body (stoma) will be made through your abdomen. ? The end of your colon will be brought to the opening. It will be stitched to the skin. ? A bag will be attached to the opening. Stool will drain into this removable bag. ? The colostomy may be temporary or permanent.  The incisions from the colectomy will be closed with sutures or staples. The procedure may vary among health care providers and hospitals. What happens after the procedure?  Your blood pressure, heart rate, breathing rate, and blood oxygen level will be monitored until the medicines you were given have worn off.  You will  receive fluids through an IV tube until your bowels start to work properly.  Once your bowels are working again, you will be given clear liquids first and then solid food as tolerated.  You will be given medicines to control your pain and nausea, if needed.  Do not drive for 24 hours if you were given a sedative. This information is not intended to replace advice given to you by your health care provider. Make sure you discuss any questions you have with your health care provider. Document Released: 07/20/2002 Document Revised: 01/29/2016 Document Reviewed: 01/29/2016 Elsevier Interactive Patient Education  [employer].   The patient is scheduled for surgery at [facility] on 12/15/17. She will pre admit at the hospital on 12/04/17. She is aware of bowel prep instructions and antibiotic regimen. The patient is aware of dates and instructions.

## 2017-11-24 NOTE — Progress Notes (Signed)
Patient ID: MIREYA MEDITZ, female   DOB: [birth year], 84 y.o.   MRN: [id number]  Chief Complaint  Patient presents with  . Other    HPI Teresa Lin is a 84 y.o. female referred here by Dr Vira Agar for evaluation of polyps found on her colonoscopy done on 10/24/17, and 11/21/17. She reports some diarrhea about 4 times a week and some cramping with urgency. This only happens during the day and not at night. She reports no medication changes and no know food irritants. This has been happening for the past 6 weeks. Her colonoscopy from 10/24/17 was for a 5 year follow up. She is here today with her son, Teresa Lin.  HPI  Past Medical History:  Diagnosis Date  . Anemia   . Arthritis    osteoarthritis. Bilateral knee replacement, hands, meniscal tear, cervical disc disease  . Collagen vascular disease ([place])   . Colon polyps   . Diverticulosis   . DVT (deep venous thrombosis) ([place])   . GERD (gastroesophageal reflux disease)   . Hypertension   . Phlebitis and thrombophlebitis of deep veins of lower extremities ([facility])    after surgery  . Vision abnormalities     Past Surgical History:  Procedure Laterality Date  . APPENDECTOMY    . COLONOSCOPY WITH PROPOFOL N/A 10/24/2017   Procedure: COLONOSCOPY WITH PROPOFOL;  Surgeon: Manya Silvas, MD;  Location[facility] ENDOSCOPY;  Service: Endoscopy;  Laterality: N/A;  . COLONOSCOPY WITH PROPOFOL N/A 11/21/2017   Procedure: COLONOSCOPY WITH PROPOFOL;  Surgeon: Manya Silvas, MD;  Location: [facility] ENDOSCOPY;  Service: Endoscopy;  Laterality: N/A;  . DILATION AND CURETTAGE OF UTERUS    . EYE SURGERY     bilateral catarACT  . JOINT REPLACEMENT Bilateral    knee replacement  . TONSILLECTOMY      Family History  Problem Relation Age of Onset  . Hypertension Mother   . Cancer Mother 66       colon ca  . Heart attack Mother   . Hypertension Father   . Heart attack Father   . Hypertension Sister   . Heart attack Sister   . Multiple sclerosis Daughter   .  Multiple sclerosis Son   . Multiple sclerosis Son     Social History Social History   Tobacco Use  . Smoking status: Never Smoker  . Smokeless tobacco: Never Used  Substance Use Topics  . Alcohol use: Yes    Alcohol/week: 1.2 oz    Types: 2 Cans of beer per week    Comment: occasionally   . Drug use: No    Allergies  Allergen Reactions  . Infliximab Other (See Comments)    Shaking, pain  . Synvisc [Hylan G-F 20] Swelling    Current Outpatient Medications  Medication Sig Dispense Refill  . alendronate (FOSAMAX) 70 MG tablet Take by mouth. Reported on 11/13/2015    . aspirin 81 MG tablet Take 81 mg by mouth daily.    . calcium-vitamin D (OSCAL WITH D) 500-200 MG-UNIT per tablet Take 1 tablet by mouth. Reported on 12/14/4194    . folic acid (FOLVITE) 1 MG tablet Take 1 mg by mouth daily.     . hydroxychloroquine (PLAQUENIL) 200 MG tablet Take 200 mg by mouth daily.    . Lactobacillus Rhamnosus, GG, (CULTURELLE PO) Take 1 tablet by mouth daily. Probiotic    . methotrexate 50 MG/2ML injection Inject into the skin once a week.     . Multiple Vitamins-Minerals (  MULTIVITAMIN WITH MINERALS) tablet Take 1 tablet by mouth daily.    . metroNIDAZOLE (FLAGYL) 500 MG tablet Take 1 tablet (500 mg total) by mouth See admin instructions. Take 1 tablet at 6 pm and take 1 tablet at 11 pm 2 tablet 0  . neomycin (MYCIFRADIN) 500 MG tablet Take 1 tablet (500 mg total) by mouth See admin instructions. Take 2 tablets at 6 pm, then take 2 tablets at 11 pm. 4 tablet 0  . polyethylene glycol powder (GLYCOLAX/MIRALAX) powder Take 255 g by mouth once for 1 dose. Mix whole container with 64 ounces of clear liquids 255 g 0   No current facility-administered medications for this visit.     Review of Systems Review of Systems  Constitutional: Negative.   Respiratory: Negative.   Cardiovascular: Negative.   Gastrointestinal: Positive for diarrhea. Negative for abdominal distention, abdominal pain, anal  bleeding, blood in stool, constipation, nausea, rectal pain and vomiting.    Blood pressure 120/62, pulse 68, resp. rate 14, height 5\' 5"  (1.651 m), weight 146 lb (66.2 kg).  Physical Exam Physical Exam  Constitutional: She is oriented to person, place, and time. She appears well-developed and well-nourished.  Eyes: Conjunctivae are normal. No scleral icterus.  Neck: Neck supple.  Cardiovascular: Normal rate, regular rhythm, normal heart sounds and intact distal pulses.  No lower extremity edema is noted.  Occasional premature beat.  Pulmonary/Chest: Effort normal and breath sounds normal.  Abdominal: Soft. Normal appearance and bowel sounds are normal. There is no tenderness.  Lymphadenopathy:    She has no cervical adenopathy.  Neurological: She is alert and oriented to person, place, and time.  Skin: Skin is warm and dry.  Psychiatric: She has a normal mood and affect.    Data Reviewed October 24, 2017:   COLON POLYP, ASCENDING; HOT AND COLD SNARE:  - SESSILE SERRATED ADENOMA WITH AREAS OF HIGH-GRADE DYSPLASIA.  - CANNOT EXCLUDE FOCAL ADENOCARCINOMA.  - DEEPER SECTIONS EXAMINED.   November 21, 2017 follow-up colonoscopy results  A. COLON POLYP X2, CECUM; COLD BIOPSY:  - TUBULAR ADENOMAS (2).  - NEGATIVE FOR HIGH-GRADE DYSPLASIA AND MALIGNANCY.   B. COLON POLYP, ASCENDING; COLD BIOPSY:  - POLYPOID FRAGMENTS OF UNREMARKABLE COLONIC MUCOSA WITH 2 SMALL ADMIXED  FRAGMENTS OF GRANULATION TYPE TISSUE.  - NEGATIVE FOR DYSPLASIA AND MALIGNANCY.   C. COLON POLYP, DISTAL ASCENDING; HOT SNARE:  - TUBULAR ADENOMA.  - NEGATIVE FOR HIGH-GRADE DYSPLASIA AND MALIGNANCY.   Endoscopy images for both of the above-mentioned colonoscopy exams reviewed.  Case discussed in person with Dr. Vira Agar.  Assessment    High-grade dysplasia involving the polyp in the proximal ascending colon just above the ileocecal valve based on images and post procedure plain film.    Plan    Options for  management were reviewed with the patient and her son: 1) sequential colonoscopies every 6 months for the first year, yearly thereafter to determine if additional dysplastic or malignant tissue is evident versus 2) right hemicolectomy.  The patient reports that in the last month, prior to her June 2019 colonoscopy she had urgency with loose, pudding-like stools without blood or mucus.  No left colonic malignancy was noted.  Some improvement after her first colonoscopy,.  No evidence of villous lesions on review of the images.  Risk of associated with: Surgery were reviewed including 1) increase stool frequency; 2) bleeding, infection, anastomotic leakage; 3) ongoing needs for immunosuppression with methotrexate and Plaquenil for her rheumatoid arthritis.  She had an evaluation  in April of this year with the cardiology service with no obvious abnormalities noted on their notes.  We will contact her rheumatologist to determine optimal timing for cessation of methotrexate prior to surgery.  Patient may need a brief cardiology evaluation prior to elective surgery.      HPI, Physical Exam, Assessment and Plan have been scribed under the direction and in the presence of Robert Bellow, MD Concepcion Living, LPN  The patient is scheduled for surgery at [facility] on 12/15/17. She will pre admit at the hospital on 12/04/17. She is aware of bowel prep instructions and antibiotic regimen. The patient is aware of dates and instructions.  Vincente Liberty will be assisting with this case.   Documented by Caryl-Lyn Otis Brace LPN   Forest Gleason Byrnett 11/24/2017, 7:52 PM

## 2017-12-02 IMAGING — MG MM DIGITAL SCREENING BILAT W/ TOMO W/ CAD
8 of 13 series · 8 of 29 positions shown · non-contrast
Comparison: Previous exam(s).

CLINICAL DATA: Screening.

EXAM:
2D DIGITAL SCREENING BILATERAL MAMMOGRAM WITH CAD AND ADJUNCT TOMO

[R MLO (1 of 2)]
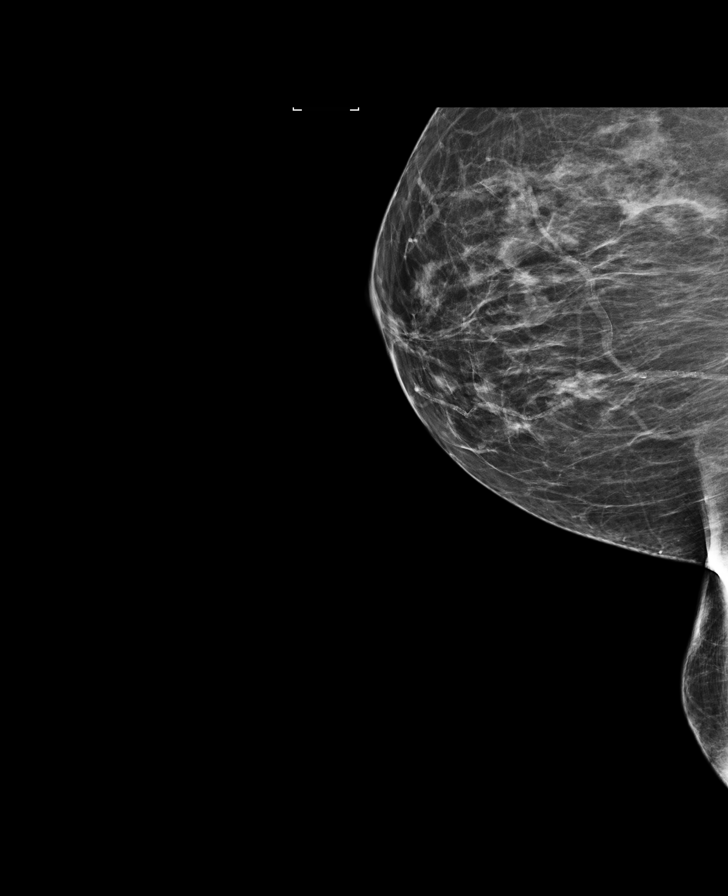

[R CC]
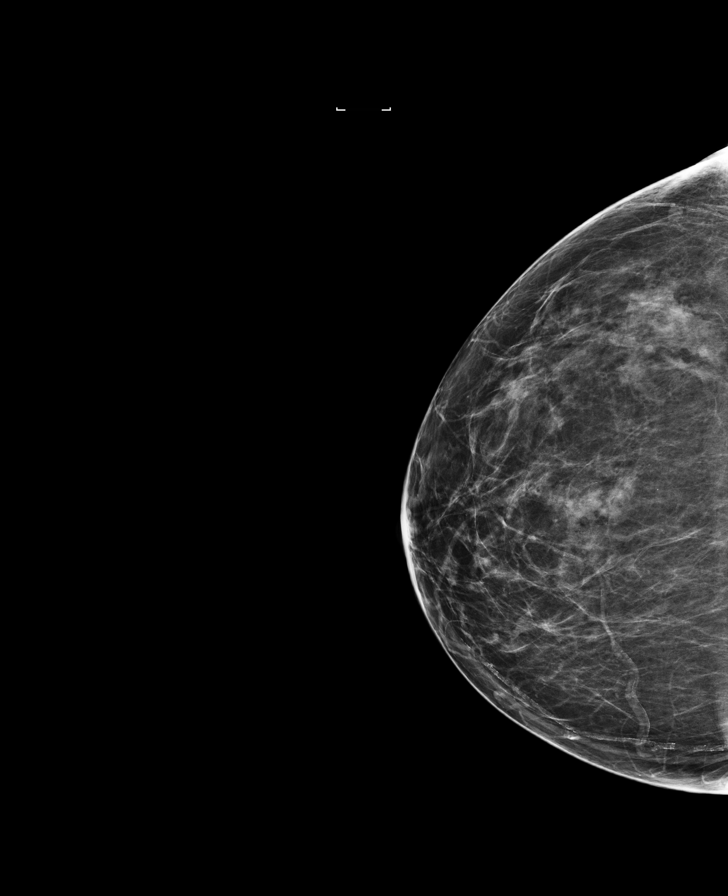

[L MLO]
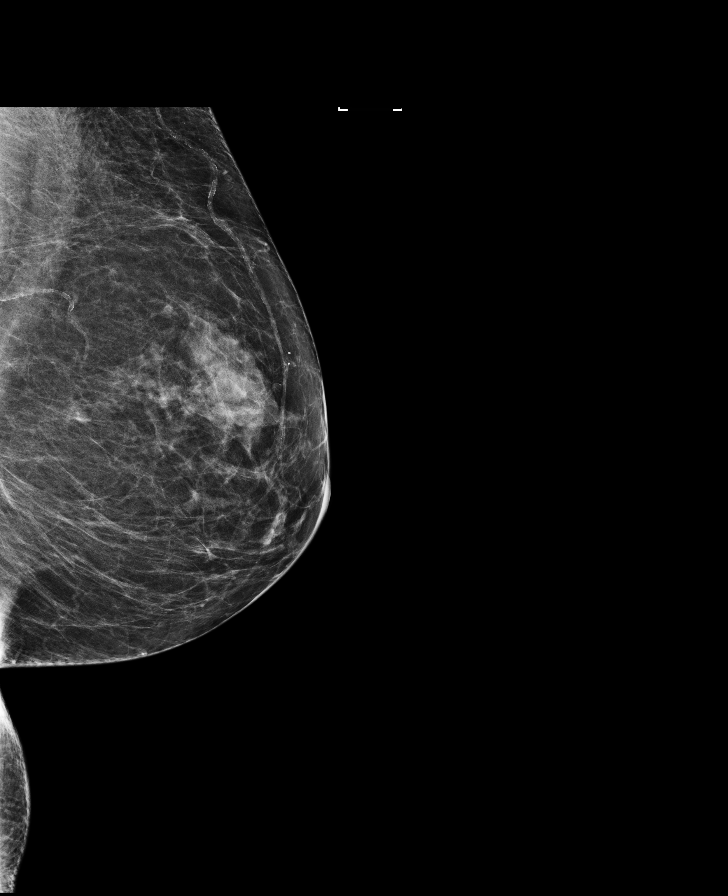

[L MLO synth-2D]
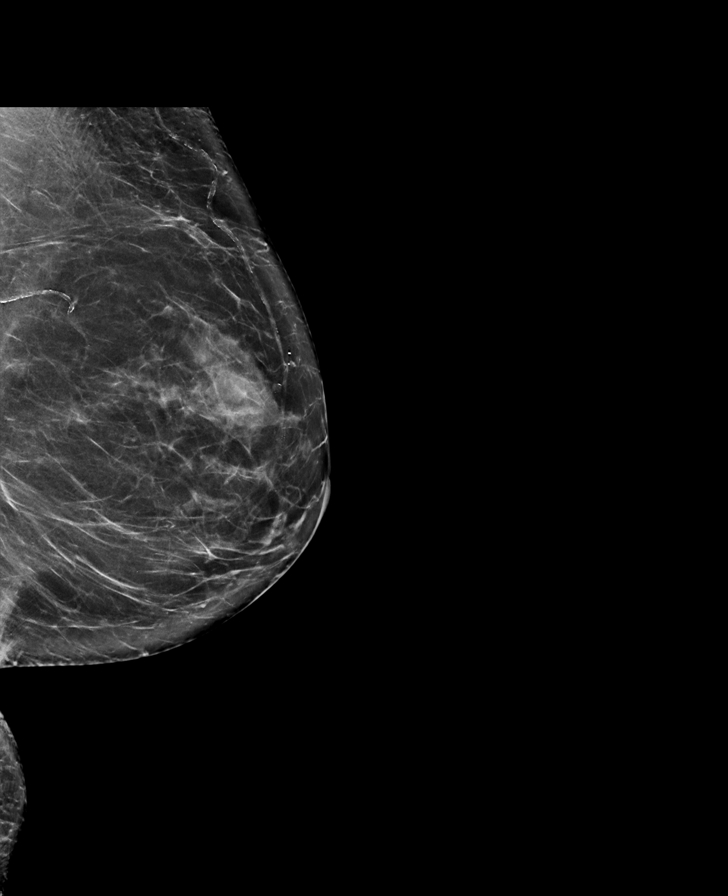

[L CC synth-2D]
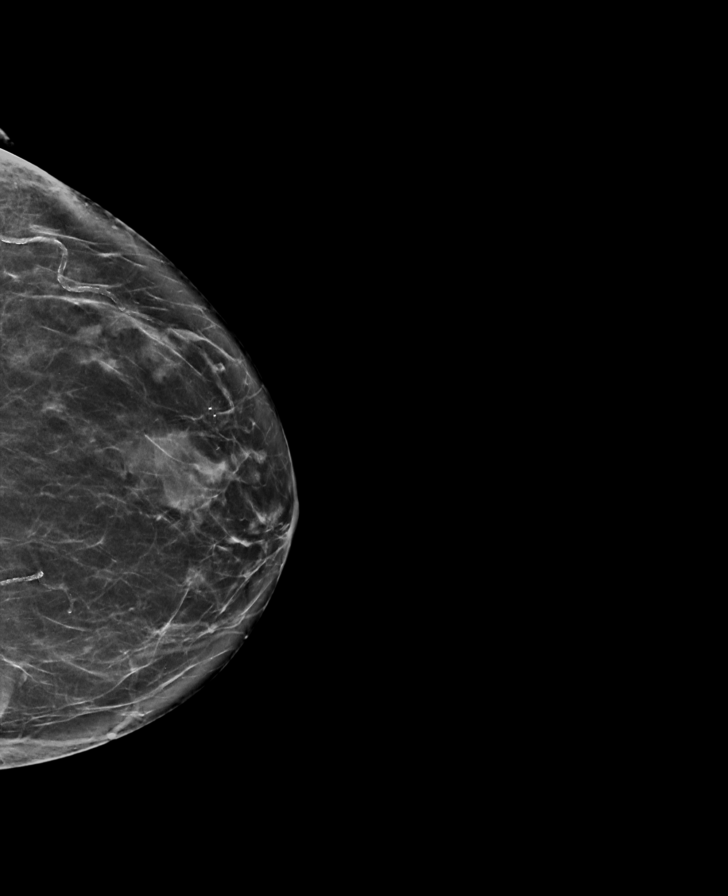

[R MLO (2 of 2)]
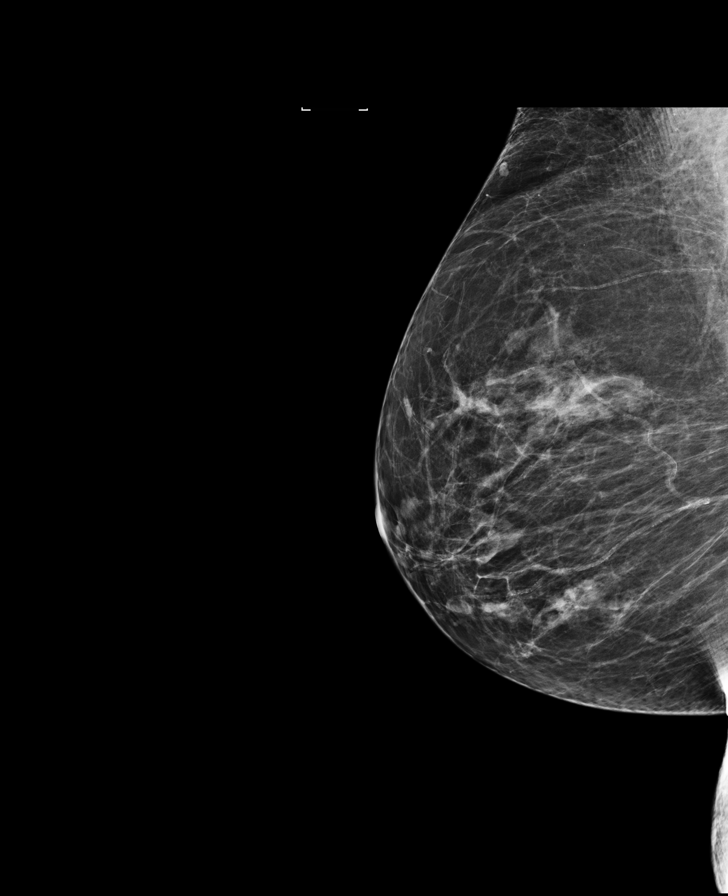

[R CC synth-2D]
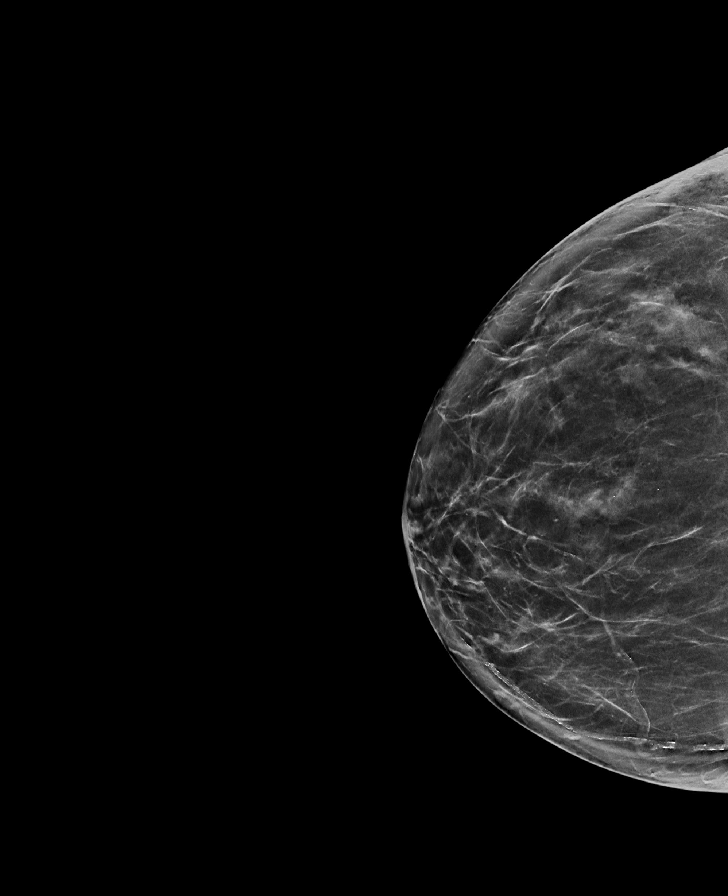

[R MLO synth-2D]
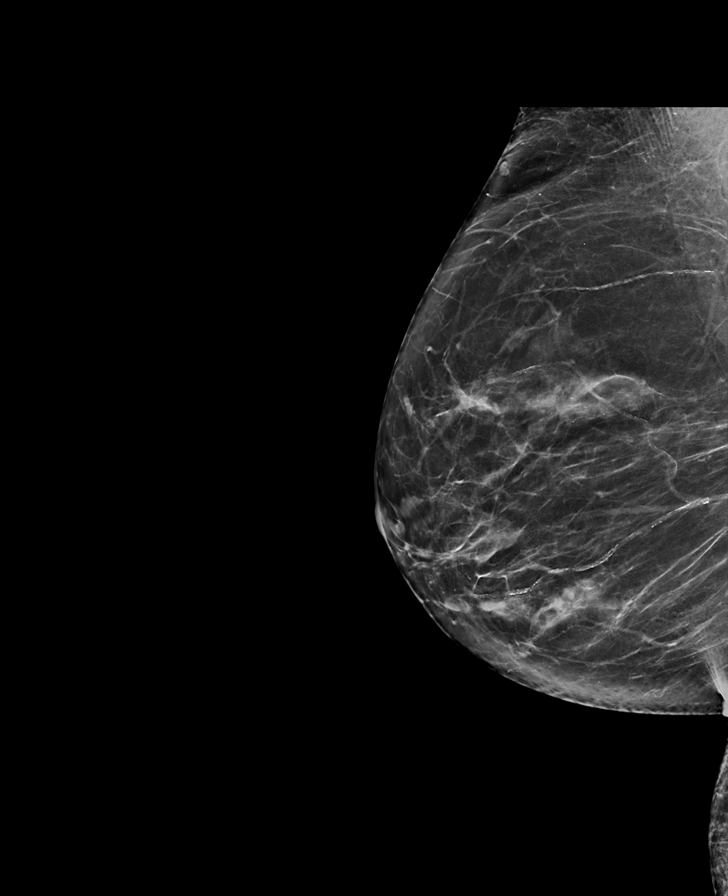

[8 of 29 positions shown; findings below may reference images not displayed]

ACR Breast Density Category b: There are scattered areas of
fibroglandular density.
FINDINGS: There are no findings suspicious for malignancy. Images were
processed with CAD.
IMPRESSION: No mammographic evidence of malignancy. A result letter of this
screening mammogram will be mailed directly to the patient.

RECOMMENDATION:
Screening mammogram in one year. (Code:97-6-RS4)

BI-RADS CATEGORY  1: Negative.

## 2017-12-03 DIAGNOSIS — D099 Carcinoma in situ, unspecified: Secondary | ICD-10-CM | POA: Diagnosis not present

## 2017-12-04 ENCOUNTER — Inpatient Hospital Stay: Admission: RE | Admit: 2017-12-04 | Payer: Medicare Other | Source: Ambulatory Visit

## 2017-12-08 ENCOUNTER — Encounter
Admission: RE | Admit: 2017-12-08 | Discharge: 2017-12-08 | Disposition: A | Discharge status: Home / Self Care | Payer: Medicare Other | Source: Ambulatory Visit | Attending: General Surgery | Admitting: General Surgery

## 2017-12-08 ENCOUNTER — Other Ambulatory Visit: Payer: Self-pay

## 2017-12-08 DIAGNOSIS — Z01818 Encounter for other preprocedural examination: Secondary | ICD-10-CM | POA: Diagnosis not present

## 2017-12-08 DIAGNOSIS — D010 Carcinoma in situ of colon: Secondary | ICD-10-CM | POA: Insufficient documentation

## 2017-12-08 LAB — TYPE AND SCREEN
ABO/RH(D): O POS
Antibody Screen: NEGATIVE

## 2017-12-08 NOTE — Patient Instructions (Signed)
Your procedure is scheduled on: Monday, December 15, 2017 Report to Day Surgery on the 2nd floor of the [facility]. To find out your arrival time, please call [phone] between 1PM - 3PM on: Friday, December 12, 2017  REMEMBER: Instructions that are not followed completely may result in serious medical risk, up to and including death; or upon the discretion of your surgeon and anesthesiologist your surgery may need to be rescheduled.  Do not eat or drink after midnight the night before your procedure.  No gum chewing, lozengers or hard candies.  Follow instructions given to you by Dr. Bary Castilla regarding liquid and medication prep.  No Alcohol for 24 hours before or after surgery.  No Smoking including e-cigarettes for 24 hours prior to surgery.  No chewable tobacco products for at least 6 hours prior to surgery.  No nicotine patches on the day of surgery.  On the morning of surgery brush your teeth with toothpaste and water, you may rinse your mouth with mouthwash if you wish. Do not swallow any toothpaste or mouthwash.  Notify your doctor if there is any change in your medical condition (cold, fever, infection).  Do not wear jewelry, make-up, hairpins, clips or nail polish.  Do not wear lotions, powders, or perfumes. You may wear deodorant.  Do not shave 48 hours prior to surgery.   Contacts and dentures may not be worn into surgery.  Do not bring valuables to the hospital, including drivers license, insurance or credit cards.  [facility] is not responsible for any belongings or valuables.   TAKE THESE MEDICATIONS THE MORNING OF SURGERY:  none  Use CHG Soap as directed on instruction sheet.  Stop Anti-inflammatories (NSAIDS) such as METHOTREXATE, Advil, Aleve, Ibuprofen, Motrin, Naproxen, Naprosyn and Aspirin based products such as Excedrin, Goodys Powder, BC Powder. (May take Tylenol or Acetaminophen if needed.)  Stop ANY OVER THE COUNTER supplements until after  surgery. (May continue Vitamin D, Vitamin B, and multivitamin.)  Wear comfortable clothing (specific to your surgery type) to the hospital.  Plan for stool softeners for home use.  If you are being admitted to the hospital overnight, leave your suitcase in the car. After surgery it may be brought to your room.  Please call [phone] if you have any questions about these instructions.

## 2017-12-09 DIAGNOSIS — M25572 Pain in left ankle and joints of left foot: Secondary | ICD-10-CM | POA: Diagnosis not present

## 2017-12-09 DIAGNOSIS — M0609 Rheumatoid arthritis without rheumatoid factor, multiple sites: Secondary | ICD-10-CM | POA: Diagnosis not present

## 2017-12-10 ENCOUNTER — Telehealth: Payer: Self-pay

## 2017-12-10 NOTE — Telephone Encounter (Signed)
FYI..the patient is having precancerous polyps removed on 12/15/2017 by Dr. Bary Castilla.

## 2017-12-10 NOTE — Telephone Encounter (Signed)
Copied from [place] [id number]. Topic: General - Other >> Dec 10, 2017 10:48 AM Synthia Innocent wrote: Reason for CRM: FYI patient is having precancerous polyps removed on 12/15/17 by Dr Bary Castilla

## 2017-12-11 ENCOUNTER — Encounter: Payer: Self-pay | Admitting: General Surgery

## 2017-12-11 NOTE — Progress Notes (Signed)
Office note from December 09, 2017 completed by G. Cristi Loron Junior reviewed.  Recommended that she not take methotrexate the week of surgery and encouraged a minimal time off medication to minimize flares which might require steroids for control.

## 2017-12-12 DIAGNOSIS — M545 Low back pain: Secondary | ICD-10-CM | POA: Diagnosis not present

## 2017-12-14 MED ORDER — SODIUM CHLORIDE 0.9 % IV SOLN
1.0000 g | INTRAVENOUS | Status: AC
  Administered 2017-12-15: 1 g via INTRAVENOUS
  Filled 2017-12-14: qty 1

## 2017-12-15 ENCOUNTER — Inpatient Hospital Stay
Admission: RE | Admit: 2017-12-15 | Discharge: 2017-12-19 | DRG: 331 | Disposition: A | Discharge status: Home / Self Care | Payer: Medicare Other | Attending: General Surgery | Admitting: General Surgery

## 2017-12-15 ENCOUNTER — Inpatient Hospital Stay: Payer: Medicare Other | Admitting: Registered Nurse

## 2017-12-15 ENCOUNTER — Other Ambulatory Visit: Payer: Self-pay

## 2017-12-15 ENCOUNTER — Encounter
Admission: RE | Disposition: A | Discharge status: Home / Self Care | Payer: Self-pay | Source: Home / Self Care | Attending: General Surgery

## 2017-12-15 ENCOUNTER — Encounter: Payer: Self-pay | Admitting: Anesthesiology

## 2017-12-15 DIAGNOSIS — Z86718 Personal history of other venous thrombosis and embolism: Secondary | ICD-10-CM

## 2017-12-15 DIAGNOSIS — Z96653 Presence of artificial knee joint, bilateral: Secondary | ICD-10-CM | POA: Diagnosis present

## 2017-12-15 DIAGNOSIS — Z800 Family history of malignant neoplasm of digestive organs: Secondary | ICD-10-CM

## 2017-12-15 DIAGNOSIS — Z888 Allergy status to other drugs, medicaments and biological substances status: Secondary | ICD-10-CM | POA: Diagnosis not present

## 2017-12-15 DIAGNOSIS — M069 Rheumatoid arthritis, unspecified: Secondary | ICD-10-CM | POA: Diagnosis present

## 2017-12-15 DIAGNOSIS — D122 Benign neoplasm of ascending colon: Secondary | ICD-10-CM | POA: Diagnosis not present

## 2017-12-15 DIAGNOSIS — Z7982 Long term (current) use of aspirin: Secondary | ICD-10-CM

## 2017-12-15 DIAGNOSIS — I10 Essential (primary) hypertension: Secondary | ICD-10-CM | POA: Diagnosis not present

## 2017-12-15 DIAGNOSIS — K635 Polyp of colon: Secondary | ICD-10-CM | POA: Diagnosis not present

## 2017-12-15 DIAGNOSIS — Z8672 Personal history of thrombophlebitis: Secondary | ICD-10-CM

## 2017-12-15 DIAGNOSIS — Z79899 Other long term (current) drug therapy: Secondary | ICD-10-CM | POA: Diagnosis not present

## 2017-12-15 DIAGNOSIS — Z9049 Acquired absence of other specified parts of digestive tract: Secondary | ICD-10-CM

## 2017-12-15 DIAGNOSIS — Z7983 Long term (current) use of bisphosphonates: Secondary | ICD-10-CM | POA: Diagnosis not present

## 2017-12-15 DIAGNOSIS — Z9841 Cataract extraction status, right eye: Secondary | ICD-10-CM | POA: Diagnosis not present

## 2017-12-15 DIAGNOSIS — Z86010 Personal history of colonic polyps: Secondary | ICD-10-CM | POA: Diagnosis not present

## 2017-12-15 DIAGNOSIS — Z9842 Cataract extraction status, left eye: Secondary | ICD-10-CM

## 2017-12-15 DIAGNOSIS — K219 Gastro-esophageal reflux disease without esophagitis: Secondary | ICD-10-CM | POA: Diagnosis present

## 2017-12-15 DIAGNOSIS — F411 Generalized anxiety disorder: Secondary | ICD-10-CM | POA: Diagnosis not present

## 2017-12-15 HISTORY — PX: LAPAROSCOPIC RIGHT COLECTOMY: SHX5925

## 2017-12-15 HISTORY — DX: Polyp of colon: K63.5

## 2017-12-15 LAB — ABO/RH: ABO/RH(D): O POS

## 2017-12-15 SURGERY — COLECTOMY, RIGHT, LAPAROSCOPIC
Anesthesia: General | Laterality: Right | Wound class: "Clean Contaminated "

## 2017-12-15 MED ORDER — ONDANSETRON HCL 4 MG/2ML IJ SOLN
INTRAMUSCULAR | Status: DC | PRN
  Administered 2017-12-15: 4 mg via INTRAVENOUS

## 2017-12-15 MED ORDER — FENTANYL CITRATE (PF) 100 MCG/2ML IJ SOLN
INTRAMUSCULAR | Status: DC | PRN
  Administered 2017-12-15 (×4): 50 ug via INTRAVENOUS

## 2017-12-15 MED ORDER — FOLIC ACID 1 MG PO TABS
2.0000 mg | ORAL_TABLET | Freq: Every day | ORAL | Status: DC
  Administered 2017-12-15 – 2017-12-19 (×5): 2 mg via ORAL
  Filled 2017-12-15 (×5): qty 2

## 2017-12-15 MED ORDER — FENTANYL CITRATE (PF) 100 MCG/2ML IJ SOLN: 25.0000 ug | INTRAMUSCULAR | Status: DC | PRN

## 2017-12-15 MED ORDER — LACTATED RINGERS IV SOLN
INTRAVENOUS | Status: DC
  Administered 2017-12-15: 10:00:00 via INTRAVENOUS

## 2017-12-15 MED ORDER — MORPHINE SULFATE (PF) 2 MG/ML IV SOLN: 2.0000 mg | INTRAVENOUS | Status: DC | PRN

## 2017-12-15 MED ORDER — FENTANYL CITRATE (PF) 100 MCG/2ML IJ SOLN
INTRAMUSCULAR | Status: AC
  Filled 2017-12-15: qty 2

## 2017-12-15 MED ORDER — DEXAMETHASONE SODIUM PHOSPHATE 10 MG/ML IJ SOLN
INTRAMUSCULAR | Status: DC | PRN
  Administered 2017-12-15: 10 mg via INTRAVENOUS

## 2017-12-15 MED ORDER — PROPOFOL 10 MG/ML IV BOLUS
INTRAVENOUS | Status: DC | PRN
  Administered 2017-12-15: 120 mg via INTRAVENOUS

## 2017-12-15 MED ORDER — LIDOCAINE HCL (PF) 2 % IJ SOLN
INTRAMUSCULAR | Status: AC
  Filled 2017-12-15: qty 10

## 2017-12-15 MED ORDER — ACETAMINOPHEN 10 MG/ML IV SOLN
INTRAVENOUS | Status: DC | PRN
  Administered 2017-12-15: 1000 mg via INTRAVENOUS

## 2017-12-15 MED ORDER — BACID PO TABS
1.0000 | ORAL_TABLET | ORAL | Status: DC
  Filled 2017-12-15: qty 1

## 2017-12-15 MED ORDER — FAMOTIDINE 20 MG PO TABS
20.0000 mg | ORAL_TABLET | Freq: Once | ORAL | Status: AC
  Administered 2017-12-15: 20 mg via ORAL

## 2017-12-15 MED ORDER — ENOXAPARIN SODIUM 30 MG/0.3ML ~~LOC~~ SOLN
30.0000 mg | SUBCUTANEOUS | Status: DC
  Administered 2017-12-16 – 2017-12-19 (×4): 30 mg via SUBCUTANEOUS
  Filled 2017-12-15 (×4): qty 0.3

## 2017-12-15 MED ORDER — DEXAMETHASONE SODIUM PHOSPHATE 10 MG/ML IJ SOLN
INTRAMUSCULAR | Status: AC
  Filled 2017-12-15: qty 1

## 2017-12-15 MED ORDER — BUPIVACAINE-EPINEPHRINE (PF) 0.5% -1:200000 IJ SOLN
INTRAMUSCULAR | Status: AC
  Filled 2017-12-15: qty 30

## 2017-12-15 MED ORDER — ACETAMINOPHEN 10 MG/ML IV SOLN
1000.0000 mg | Freq: Four times a day (QID) | INTRAVENOUS | Status: DC
  Administered 2017-12-15 – 2017-12-16 (×3): 1000 mg via INTRAVENOUS
  Filled 2017-12-15 (×4): qty 100

## 2017-12-15 MED ORDER — GABAPENTIN 300 MG PO CAPS
ORAL_CAPSULE | ORAL | Status: AC
  Administered 2017-12-15: 300 mg via ORAL
  Filled 2017-12-15: qty 1

## 2017-12-15 MED ORDER — ONDANSETRON HCL 4 MG/2ML IJ SOLN
INTRAMUSCULAR | Status: AC
  Filled 2017-12-15: qty 2

## 2017-12-15 MED ORDER — ONDANSETRON HCL 4 MG/2ML IJ SOLN: 4.0000 mg | Freq: Once | INTRAMUSCULAR | Status: DC | PRN

## 2017-12-15 MED ORDER — PHENYLEPHRINE HCL 10 MG/ML IJ SOLN
INTRAMUSCULAR | Status: DC | PRN
  Administered 2017-12-15: 100 ug via INTRAVENOUS

## 2017-12-15 MED ORDER — ALVIMOPAN 12 MG PO CAPS
12.0000 mg | ORAL_CAPSULE | ORAL | Status: AC
  Administered 2017-12-15: 12 mg via ORAL

## 2017-12-15 MED ORDER — POLYETHYL GLYCOL-PROPYL GLYCOL 0.4-0.3 % OP SOLN: 1.0000 [drp] | Freq: Three times a day (TID) | OPHTHALMIC | Status: DC | PRN

## 2017-12-15 MED ORDER — IBUPROFEN 400 MG PO TABS
200.0000 mg | ORAL_TABLET | ORAL | Status: DC | PRN
  Administered 2017-12-16 – 2017-12-18 (×2): 200 mg via ORAL
  Filled 2017-12-15 (×2): qty 1

## 2017-12-15 MED ORDER — FAMOTIDINE 20 MG PO TABS
ORAL_TABLET | ORAL | Status: AC
  Administered 2017-12-15: 20 mg via ORAL
  Filled 2017-12-15: qty 1

## 2017-12-15 MED ORDER — GABAPENTIN 300 MG PO CAPS
300.0000 mg | ORAL_CAPSULE | ORAL | Status: AC
  Administered 2017-12-15: 300 mg via ORAL

## 2017-12-15 MED ORDER — HYDROXYCHLOROQUINE SULFATE 200 MG PO TABS
200.0000 mg | ORAL_TABLET | Freq: Every day | ORAL | Status: DC
  Administered 2017-12-15 – 2017-12-19 (×5): 200 mg via ORAL
  Filled 2017-12-15 (×5): qty 1

## 2017-12-15 MED ORDER — GLYCOPYRROLATE 0.2 MG/ML IJ SOLN
INTRAMUSCULAR | Status: DC | PRN
  Administered 2017-12-15: 0.4 mg via INTRAVENOUS

## 2017-12-15 MED ORDER — OXYCODONE HCL 5 MG PO TABS
2.5000 mg | ORAL_TABLET | ORAL | Status: DC | PRN
  Administered 2017-12-15: 2.5 mg via ORAL
  Filled 2017-12-15: qty 1

## 2017-12-15 MED ORDER — ASPIRIN EC 81 MG PO TBEC
81.0000 mg | DELAYED_RELEASE_TABLET | Freq: Every day | ORAL | Status: DC
  Administered 2017-12-15 – 2017-12-18 (×4): 81 mg via ORAL
  Filled 2017-12-15 (×4): qty 1

## 2017-12-15 MED ORDER — ROCURONIUM BROMIDE 100 MG/10ML IV SOLN
INTRAVENOUS | Status: DC | PRN
  Administered 2017-12-15: 50 mg via INTRAVENOUS

## 2017-12-15 MED ORDER — ROCURONIUM BROMIDE 50 MG/5ML IV SOLN
INTRAVENOUS | Status: AC
  Filled 2017-12-15: qty 1

## 2017-12-15 MED ORDER — ALVIMOPAN 12 MG PO CAPS
ORAL_CAPSULE | ORAL | Status: AC
  Administered 2017-12-15: 12 mg via ORAL
  Filled 2017-12-15: qty 1

## 2017-12-15 MED ORDER — LIDOCAINE HCL (CARDIAC) PF 100 MG/5ML IV SOSY
PREFILLED_SYRINGE | INTRAVENOUS | Status: DC | PRN
  Administered 2017-12-15: 80 mg via INTRAVENOUS

## 2017-12-15 MED ORDER — PRAMOX-PE-GLYCERIN-PETROLATUM 1-0.25-14.4-15 % RE CREA: 1.0000 "application " | TOPICAL_CREAM | Freq: Every day | RECTAL | Status: DC | PRN

## 2017-12-15 MED ORDER — NEOSTIGMINE METHYLSULFATE 10 MG/10ML IV SOLN
INTRAVENOUS | Status: DC | PRN
  Administered 2017-12-15: 3 mg via INTRAVENOUS

## 2017-12-15 MED ORDER — ACETAMINOPHEN 10 MG/ML IV SOLN
INTRAVENOUS | Status: AC
  Filled 2017-12-15: qty 100

## 2017-12-15 MED ORDER — ONDANSETRON HCL 4 MG PO TABS
4.0000 mg | ORAL_TABLET | ORAL | Status: DC | PRN
  Administered 2017-12-16 (×2): 4 mg via ORAL
  Filled 2017-12-15 (×2): qty 1

## 2017-12-15 MED ORDER — LACTATED RINGERS IV SOLN
INTRAVENOUS | Status: DC
  Administered 2017-12-15 – 2017-12-17 (×4): via INTRAVENOUS

## 2017-12-15 MED ORDER — PROPOFOL 10 MG/ML IV BOLUS
INTRAVENOUS | Status: AC
  Filled 2017-12-15: qty 20

## 2017-12-15 MED ORDER — ALVIMOPAN 12 MG PO CAPS
12.0000 mg | ORAL_CAPSULE | Freq: Two times a day (BID) | ORAL | Status: DC
  Administered 2017-12-15 – 2017-12-17 (×4): 12 mg via ORAL
  Filled 2017-12-15 (×4): qty 1

## 2017-12-15 MED ORDER — BUPIVACAINE-EPINEPHRINE (PF) 0.5% -1:200000 IJ SOLN
INTRAMUSCULAR | Status: DC | PRN
  Administered 2017-12-15: 30 mL via PERINEURAL

## 2017-12-15 MED ORDER — EPHEDRINE SULFATE 50 MG/ML IJ SOLN
INTRAMUSCULAR | Status: AC
  Filled 2017-12-15: qty 1

## 2017-12-15 SURGICAL SUPPLY — 69 items
"PENCIL ELECTRO HAND CTR " (MISCELLANEOUS) ×1 IMPLANT
APPLIER CLIP ROT 10 11.4 M/L (STAPLE)
BLADE SURG 10 STRL SS SAFETY (BLADE) ×2 IMPLANT
BLADE SURG 11 STRL SS SAFETY (MISCELLANEOUS) ×2 IMPLANT
CANISTER SUCT 1200ML W/VALVE (MISCELLANEOUS) ×2 IMPLANT
CANNULA DILATOR 10 W/SLV (CANNULA) ×2 IMPLANT
CHLORAPREP W/TINT 26ML (MISCELLANEOUS) ×2 IMPLANT
CLIP APPLIE ROT 10 11.4 M/L (STAPLE) IMPLANT
COVER CLAMP SIL LG PBX B (MISCELLANEOUS) IMPLANT
DRAPE LAP W/FLUID (DRAPES) IMPLANT
DRAPE UNDER BUTTOCK W/FLU (DRAPES) IMPLANT
DRSG OPSITE POSTOP 4X10 (GAUZE/BANDAGES/DRESSINGS) ×2 IMPLANT
DRSG OPSITE POSTOP 4X8 (GAUZE/BANDAGES/DRESSINGS) ×2 IMPLANT
DRSG TEGADERM 2-3/8X2-3/4 SM (GAUZE/BANDAGES/DRESSINGS) ×6 IMPLANT
DRSG TEGADERM 4X4.75 (GAUZE/BANDAGES/DRESSINGS) ×2 IMPLANT
DRSG TELFA 3X8 NADH (GAUZE/BANDAGES/DRESSINGS) ×2 IMPLANT
ELECT BLADE 6.5 EXT (BLADE) ×2 IMPLANT
ELECT CAUTERY BLADE 6.4 (BLADE) ×2 IMPLANT
ELECT REM PT RETURN 9FT ADLT (ELECTROSURGICAL) ×2
ELECTRODE REM PT RTRN 9FT ADLT (ELECTROSURGICAL) ×1 IMPLANT
FILTER LAP SMOKE EVAC STRL (MISCELLANEOUS) ×2 IMPLANT
GLOVE BIO SURGEON STRL SZ7 (GLOVE) ×6 IMPLANT
GLOVE BIO SURGEON STRL SZ7.5 (GLOVE) ×6 IMPLANT
GLOVE INDICATOR 8.0 STRL GRN (GLOVE) ×4 IMPLANT
GOWN STRL REUS W/ TWL LRG LVL3 (GOWN DISPOSABLE) ×6 IMPLANT
GOWN STRL REUS W/TWL LRG LVL3 (GOWN DISPOSABLE) ×6
HANDLE YANKAUER SUCT BULB TIP (MISCELLANEOUS) ×2 IMPLANT
HOLDER FOLEY CATH W/STRAP (MISCELLANEOUS) ×2 IMPLANT
IRRIGATION STRYKERFLOW (MISCELLANEOUS) IMPLANT
IRRIGATOR STRYKERFLOW (MISCELLANEOUS)
IV LACTATED RINGERS 1000ML (IV SOLUTION) ×2 IMPLANT
KIT PINK PAD W/HEAD ARE REST (MISCELLANEOUS) ×2
KIT PINK PAD W/HEAD ARM REST (MISCELLANEOUS) ×1 IMPLANT
KIT TURNOVER KIT A (KITS) ×2 IMPLANT
LABEL OR SOLS (LABEL) ×2 IMPLANT
LIGASURE LAP MARYLAND 5MM 37CM (ELECTROSURGICAL) IMPLANT
NDL INSUFF ACCESS 14 VERSASTEP (NEEDLE) ×2 IMPLANT
NS IRRIG 500ML POUR BTL (IV SOLUTION) ×2 IMPLANT
PACK COLON CLEAN CLOSURE (MISCELLANEOUS) ×2 IMPLANT
PACK LAP CHOLECYSTECTOMY (MISCELLANEOUS) ×2 IMPLANT
PAD DRESSING TELFA 3X8 NADH (GAUZE/BANDAGES/DRESSINGS) ×1 IMPLANT
PAD PREP 24X41 OB/GYN DISP (PERSONAL CARE ITEMS) ×2 IMPLANT
PENCIL ELECTRO HAND CTR (MISCELLANEOUS) ×2 IMPLANT
RELOAD PROXIMATE 75MM BLUE (ENDOMECHANICALS) ×2 IMPLANT
RELOAD STAPLE 75 3.8 BLU REG (ENDOMECHANICALS) IMPLANT
RETRACTOR WOUND ALXS 18CM MED (MISCELLANEOUS) IMPLANT
RTRCTR WOUND ALEXIS O 18CM MED (MISCELLANEOUS) ×2
SCISSORS METZENBAUM CVD 33 (INSTRUMENTS) ×2 IMPLANT
SET YANKAUER POOLE SUCT (MISCELLANEOUS) ×2 IMPLANT
SPONGE LAP 18X18 RF (DISPOSABLE) ×6 IMPLANT
STAPLER PROXIMATE 75MM BLUE (STAPLE) ×1 IMPLANT
STRIP CLOSURE SKIN 1/2X4 (GAUZE/BANDAGES/DRESSINGS) ×2 IMPLANT
SUT MAXON ABS #0 GS21 30IN (SUTURE) ×8 IMPLANT
SUT SILK 2 0 (SUTURE) ×2
SUT SILK 2-0 30XBRD TIE 12 (SUTURE) ×1 IMPLANT
SUT SILK 3-0 (SUTURE) ×4 IMPLANT
SUT VIC AB 0 CT1 36 (SUTURE) ×2 IMPLANT
SUT VIC AB 2-0 BRD 54 (SUTURE) ×4 IMPLANT
SUT VIC AB 2-0 CT1 27 (SUTURE) ×2
SUT VIC AB 2-0 CT1 TAPERPNT 27 (SUTURE) ×2 IMPLANT
SUT VIC AB 3-0 54X BRD REEL (SUTURE) ×2 IMPLANT
SUT VIC AB 3-0 BRD 54 (SUTURE) ×2
SUT VIC AB 3-0 SH 27 (SUTURE) ×2
SUT VIC AB 3-0 SH 27X BRD (SUTURE) ×2 IMPLANT
SUT VIC AB 4-0 FS2 27 (SUTURE) ×4 IMPLANT
TRAY FOLEY MTR SLVR 16FR STAT (SET/KITS/TRAYS/PACK) ×2 IMPLANT
TROCAR XCEL NON-BLD 11X100MML (ENDOMECHANICALS) ×2 IMPLANT
TROCAR XCEL UNIV SLVE 11M 100M (ENDOMECHANICALS) ×4 IMPLANT
TUBING INSUF HEATED (TUBING) ×2 IMPLANT

## 2017-12-15 NOTE — Anesthesia Post-op Follow-up Note (Signed)
Anesthesia QCDR form completed.        

## 2017-12-15 NOTE — Transfer of Care (Signed)
Immediate Anesthesia Transfer of Care Note  Patient: Teresa Lin  Procedure(s) Performed: Procedure(s): LAPAROSCOPIC RIGHT COLECTOMY (Right)  Patient Location: PACU  Anesthesia Type:General  Level of Consciousness: sedated  Airway & Oxygen Therapy: Patient Spontanous Breathing and Patient connected to face mask oxygen  Post-op Assessment: Report given to RN and Post -op Vital signs reviewed and stable  Post vital signs: Reviewed and stable  Last Vitals:  Vitals:   12/15/17 0854 12/15/17 1249  BP: (!) 156/67 (!) 144/76  Pulse: 83 88  Resp:  (!) 30  Temp: 36.9 C 37.2 C  SpO2:  96%    Complications: No apparent anesthesia complications

## 2017-12-15 NOTE — Op Note (Signed)
Preoperative diagnosis: Sessile serrated adenoma the a sending colon with high-grade dysplasia.  Postoperative diagnosis: Same.  Operative procedure: Laparoscopically assisted right hemicolectomy.  Operating Surgeon: Hervey Ard, MD.  Anesthesia: General by endotracheal; Marcaine 0.5% with 1 to 200,000 units of epinephrine, 30 cc.  Estimated blood loss: 25 cc.  Fluids: 450 cc crystalloid  Clinical note: This 84 year old woman underwent a colonoscopy for family history of colon cancer and change in bowel habits.  She was found to have a 1.6 cm polyp in the ascending colon just beyond the ileocecal valve which showed a sessile serrated adenoma with focal high-grade dysplasia.  Intramucosal carcinoma could not be excluded.  A follow-up colonoscopy a month later showed biopsies of the area to be negative.  Reviewing options for management and considering her history of immunosuppression for rheumatoid arthritis and her excellent general health it was elected to proceed to right hemicolectomy.  The patient received Invanz prior to the procedure.  SCD stockings for DVT prevention.  Operative note: The patient underwent general endotracheal anesthesia without difficulty.  The left arm was tucked and padded.  Bear hugger applied.  The abdomen was cleansed with ChloraPrep and draped.  Through a trans-umbilical incision a varies needle was placed.  After assuring intra-abdominal location with a hanging drop test the abdomen was insufflated with CO2 of 10 mmHg pressure.  A 10 mm Step port was expanded.  Inspection showed no evidence of injury from initial port placement.  An 11 mm XL port was placed in the left upper quadrant and into the epigastric area under direct vision.  Inspection of the abdomen showed a few filmy adhesions in the right lower quadrant from her previous appendectomy.  The omentum was freed from the midportion of the transverse colon making use of the LigaSure device.  The hepatic  flexure was taken down.  The duodenum was visualized and protected.  The white line of Toldt was divided and the adhesions to near the cecum divided as well.  The right ureter was identified medial to the iliac vessels.  Once the right colon could be brought in the left upper quadrant the ports were removed and a 7 cm incision beginning at the umbilical port site was made after infiltration of local anesthetic.  The skin was incised sharply and the remaining dissection completed with electrocautery.  The wound was protected with a medium Alexis wound protector.  The right colon was easily brought into the operative field.  A wet lap was placed in the right paracolic gutter which was retrieved at the end of the procedure and was found to be without blood.  The mesentery was scored and then divided making use of the harmonic scalpel.  2-0 silk ties were used for the ileocolic, right colic and right branch of the middle colic vessels.  A side-to-side functional end-to-end anastomosis was completed by approximating the terminal ileum about 6 cm from the ileocecal valve to the mid transverse colon with interrupted 3-0 silk seromuscular sutures.  Enterotomies were made and a GIA-75 stapler was fired.  Good hemostasis was noticed.  A second firing at right angles completed the anastomosis.  It palpated to 2-3 fingerbreadths.  The ends of the anastomosis were inverted with 3-0 silk seromuscular sutures and the crotch was reinforced in similar fashion.  The mesenteric defect was closed with a running 3-0 Vicryl suture.  Inspection of the retroperitoneum was unremarkable.  With the sponge tape and instrument count correct a clean closure procedure was undertaken after  cleansing the skin around the incisions with Betadine solution.  The surgeon's gowns and gloves were changed and new drapes applied the peritoneum was approximated with a running 0 Vicryl suture.  The midline fascia was then closed with interrupted 0 Maxon P  figure-of-eight sutures.  The wound was irrigated before closure of the soft tissue with a running 2-0 Vicryl suture.  The area around the umbilicus was reconstructed with interrupted 2-0 Vicryl sutures and then interrupted 4-0 Vicryl subcuticular sutures for the skin.  The superior aspect of the midline wound was closed with a running 4-0 Vicryl subcuticular suture.  Port sites were closed with 4-0 Vicryl suture.  Benzoin Steri-Strips were applied.  Honeycomb dressing applied to the main wound, Telfa and Tegaderm to the port sites.  The patient tolerated the procedure well and was taken to recovery room in stable condition.

## 2017-12-15 NOTE — Anesthesia Preprocedure Evaluation (Addendum)
Anesthesia Evaluation  Patient identified by MRN, date of birth, ID band Patient awake    Reviewed: Allergy & Precautions, H&P , NPO status , Patient's Chart, lab work & pertinent test results, reviewed documented beta blocker date and time   History of Anesthesia Complications Negative for: history of anesthetic complications  Airway Mallampati: II   Neck ROM: full    Dental  (+) Poor Dentition   Pulmonary neg pulmonary ROS,    Pulmonary exam normal        Cardiovascular Exercise Tolerance: Good hypertension, On Medications + Peripheral Vascular Disease  negative cardio ROS Normal cardiovascular exam Rhythm:regular Rate:Normal     Neuro/Psych PSYCHIATRIC DISORDERS Anxiety negative neurological ROS  negative psych ROS   GI/Hepatic negative GI ROS, Neg liver ROS, GERD  Medicated,  Endo/Other  negative endocrine ROS  Renal/GU negative Renal ROS  negative genitourinary   Musculoskeletal  (+) Arthritis ,   Abdominal   Peds  Hematology negative hematology ROS (+) anemia ,   Anesthesia Other Findings Past Medical History: No date: Anemia No date: Arthritis     Comment:  osteoarthritis. Bilateral knee replacement, hands,               meniscal tear, cervical disc disease No date: Collagen vascular disease (HCC) No date: Colon polyps No date: Diverticulosis No date: DVT (deep venous thrombosis) (HCC) No date: GERD (gastroesophageal reflux disease) No date: Hypertension No date: Phlebitis and thrombophlebitis of deep veins of lower  extremities (HCC)     Comment:  after surgery No date: Vision abnormalities  Echo shows no ischemic issues and EF > 55%  Reproductive/Obstetrics negative OB ROS                            Anesthesia Physical  Anesthesia Plan  ASA: III  Anesthesia Plan: General   Post-op Pain Management:    Induction: Intravenous  PONV Risk Score and Plan: 3 and  Propofol infusion and TIVA  Airway Management Planned: Oral ETT  Additional Equipment:   Intra-op Plan:   Post-operative Plan: Extubation in OR  Informed Consent: I have reviewed the patients History and Physical, chart, labs and discussed the procedure including the risks, benefits and alternatives for the proposed anesthesia with the patient or authorized representative who has indicated his/her understanding and acceptance.   Dental advisory given  Plan Discussed with: CRNA and Surgeon  Anesthesia Plan Comments:         Anesthesia Quick Evaluation

## 2017-12-15 NOTE — H&P (Signed)
Teresa Lin [id number] 1931-06-13     HPI:  84 y/o who was identified with a right colon polyp with high grade dysplasia.  For right colectomy. The patient did not feel the prep had cleaned her out as well as in the past, still with colored water, and she took an additional half bottle of MiraLAX.  No vomiting.  She took last week's methotrexate as recommended by rheumatology.  She did not take this week's dose.  We reviewed plans for right colectomy.  Medications Prior to Admission  Medication Sig Dispense Refill Last Dose  . alendronate (FOSAMAX) 70 MG tablet Take 70 mg by mouth every Monday. Reported on 11/13/2015   Past Month at Unknown time  . aspirin EC 81 MG tablet Take 81 mg by mouth at bedtime.   Past Week at Unknown time  . calcium-vitamin D (OSCAL WITH D) 500-200 MG-UNIT per tablet Take 1 tablet by mouth daily. Reported on 11/13/2015   Past Week at Unknown time  . folic acid (FOLVITE) 1 MG tablet Take 2 mg by mouth daily.    12/14/2017 at Unknown time  . hydroxychloroquine (PLAQUENIL) 200 MG tablet Take 200 mg by mouth daily.   12/14/2017 at Unknown time  . Lactobacillus Rhamnosus, GG, (CULTURELLE PO) Take 1 tablet by mouth 4 (four) times a week. Probiotic    Past Week at Unknown time  . methotrexate 50 MG/2ML injection Inject 20 mg into the skin every Sunday.    Past Week at Unknown time  . Multiple Vitamins-Minerals (MULTIVITAMIN WITH MINERALS) tablet Take 1 tablet by mouth daily. Centrum Silver   Past Week at Unknown time  . neomycin (MYCIFRADIN) 500 MG tablet Take 1 tablet (500 mg total) by mouth See admin instructions. Take 2 tablets at 6 pm, then take 2 tablets at 11 pm. 4 tablet 0 Not Taking at Unknown time  . Polyethyl Glycol-Propyl Glycol (SYSTANE) 0.4-0.3 % SOLN Place 1 drop into both eyes 3 (three) times daily as needed (for eye burning/irritation).   Not Taking at Unknown time  . PREPARATION H 1-0.25-14.4-15 % CREA Place 1 application rectally daily as needed (for rectal  discomfort).   Completed Course at Unknown time   Allergies  Allergen Reactions  . Infliximab Other (See Comments)    Shaking, pain  . Synvisc [Hylan G-F 20] Swelling   Past Medical History:  Diagnosis Date  . Anemia   . Arthritis    osteoarthritis. Bilateral knee replacement, hands, meniscal tear, cervical disc disease  . Collagen vascular disease ([place])   . Colon polyps   . Diverticulosis   . DVT (deep venous thrombosis) ([place])   . GERD (gastroesophageal reflux disease)   . Hypertension   . Phlebitis and thrombophlebitis of deep veins of lower extremities (HCC)    after surgery  . Vision abnormalities    Past Surgical History:  Procedure Laterality Date  . APPENDECTOMY    . COLONOSCOPY WITH PROPOFOL N/A 10/24/2017   Procedure: COLONOSCOPY WITH PROPOFOL;  Surgeon: Manya Silvas, MD;  Location[facility] ENDOSCOPY;  Service: Endoscopy;  Laterality: N/A;  . COLONOSCOPY WITH PROPOFOL N/A 11/21/2017   Procedure: COLONOSCOPY WITH PROPOFOL;  Surgeon: Manya Silvas, MD;  Location: [facility] ENDOSCOPY;  Service: Endoscopy;  Laterality: N/A;  . DILATION AND CURETTAGE OF UTERUS    . EYE SURGERY     bilateral catarACT  . JOINT REPLACEMENT Bilateral    knee replacement  . REPLACEMENT TOTAL KNEE BILATERAL    . TONSILLECTOMY  Social History   Socioeconomic History  . Marital status: Widowed    Spouse name: Not on file  . Number of children: Not on file  . Years of education: Not on file  . Highest education level: Not on file  Occupational History  . Not on file  Social Needs  . Financial resource strain: Not hard at all  . Food insecurity:    Worry: Never true    Inability: Never true  . Transportation needs:    Medical: No    Non-medical: No  Tobacco Use  . Smoking status: Never Smoker  . Smokeless tobacco: Never Used  Substance and Sexual Activity  . Alcohol use: Yes    Alcohol/week: 1.2 oz    Types: 2 Cans of beer per week    Comment: occasionally   . Drug use: No   . Sexual activity: Never  Lifestyle  . Physical activity:    Days per week: 3 days    Minutes per session: 60 min  . Stress: Only a little  Relationships  . Social connections:    Talks on phone: Not on file    Gets together: Not on file    Attends religious service: Not on file    Active member of club or organization: Not on file    Attends meetings of clubs or organizations: Not on file    Relationship status: Not on file  . Intimate partner violence:    Fear of current or ex partner: Not on file    Emotionally abused: Not on file    Physically abused: Not on file    Forced sexual activity: Not on file  Other Topics Concern  . Not on file  Social History Narrative  . Not on file   Social History   Social History Narrative  . Not on file     ROS: Negative.     PE: HEENT: Negative. Lungs: Clear. Cardio: RR.  Assessment/Plan:  Proceed with planned right colectomy. Forest Gleason [facility] 12/15/2017

## 2017-12-15 NOTE — Anesthesia Procedure Notes (Signed)
Procedure Name: Intubation Date/Time: 12/15/2017 10:20 AM Performed by: Doreen Salvage, CRNA Pre-anesthesia Checklist: Patient identified, Patient being monitored, Timeout performed, Emergency Drugs available and Suction available Patient Re-evaluated:Patient Re-evaluated prior to induction Oxygen Delivery Method: Circle system utilized Preoxygenation: Pre-oxygenation with 100% oxygen Induction Type: IV induction Ventilation: Mask ventilation without difficulty Laryngoscope Size: Mac, 3 and McGraph Grade View: Grade I Tube type: Oral Tube size: 7.0 mm Number of attempts: 2 Airway Equipment and Method: Stylet Placement Confirmation: ETT inserted through vocal cords under direct vision,  positive ETCO2 and breath sounds checked- equal and bilateral Secured at: 20 cm Tube secured with: Tape Dental Injury: Teeth and Oropharynx as per pre-operative assessment  Difficulty Due To: Difficult Airway- due to anterior larynx

## 2017-12-15 NOTE — Anesthesia Postprocedure Evaluation (Signed)
Anesthesia Post Note  Patient: Teresa Lin  Procedure(s) Performed: LAPAROSCOPIC RIGHT COLECTOMY (Right )  Patient location during evaluation: PACU Anesthesia Type: General Level of consciousness: awake and alert and oriented Pain management: pain level controlled Vital Signs Assessment: post-procedure vital signs reviewed and stable Respiratory status: spontaneous breathing Cardiovascular status: blood pressure returned to baseline Anesthetic complications: no     Last Vitals:  Vitals:   12/15/17 1351 12/15/17 1437  BP: (!) 142/58 124/76  Pulse: 68 81  Resp: 20   Temp: 36.9 C   SpO2: 96% 98%    Last Pain:  Vitals:   12/15/17 1351  TempSrc: Axillary  PainSc: Asleep                 CARROLL,PAUL

## 2017-12-16 MED ORDER — ACETAMINOPHEN 325 MG PO TABS
650.0000 mg | ORAL_TABLET | ORAL | Status: DC
  Administered 2017-12-16 – 2017-12-19 (×12): 650 mg via ORAL
  Filled 2017-12-16 (×14): qty 2

## 2017-12-16 NOTE — Progress Notes (Signed)
AVSS. Minimal pain. Ambulating well. Hesitant about po to date, has take some water. Denies nausea, afraid of vomiting. Lungs: Clear. Cardio: RR. ABD: Soft, minimal right side tenderness. BS+. Extrem: Soft. Doing well. Encouraged to try po. Continue ambulation. Change Tylenol from IV to po.

## 2017-12-17 LAB — SURGICAL PATHOLOGY

## 2017-12-17 NOTE — Progress Notes (Signed)
AVSS. Mild nausea, well controled w/ Zofran x 2. Minimal narcotic requirements. No flatus. Ambulating well.  Incentive at 1000, a little below evening of surgery. Lungs: Clear. ABD: Mild distension, soft. IMP: Doing well. Plan: Full liquids. Continue ambulation. D/C Entereg.

## 2017-12-17 NOTE — Progress Notes (Signed)
Per MD okay for RN to advance diet to full liquids.  

## 2017-12-18 LAB — BASIC METABOLIC PANEL
Anion gap: 9 (ref 5–15)
BUN: 10 mg/dL (ref 8–23)
CO2: 29 mmol/L (ref 22–32)
Calcium: 8.8 mg/dL — ABNORMAL LOW (ref 8.9–10.3)
Chloride: 104 mmol/L (ref 98–111)
Creatinine, Ser: 0.45 mg/dL (ref 0.44–1.00)
GFR calc Af Amer: >60 mL/min (ref >60)
GFR calc non Af Amer: >60 mL/min (ref >60)
Glucose, Bld: 91 mg/dL (ref 70–99)
Potassium: 3.6 mmol/L (ref 3.5–5.1)
Sodium: 142 mmol/L (ref 135–145)

## 2017-12-18 LAB — CBC WITH DIFFERENTIAL/PLATELET
Basophils Absolute: 0 10*3/uL (ref 0–0.1)
Basophils Relative: 0 %
Eosinophils Absolute: 0.2 10*3/uL (ref 0–0.7)
Eosinophils Relative: 3 %
HCT: 38.9 % (ref 35.0–47.0)
Hemoglobin: 13.5 g/dL (ref 12.0–16.0)
Lymphocytes Relative: 17 %
Lymphs Abs: 1.1 10*3/uL (ref 1.0–3.6)
MCH: 32.4 pg (ref 26.0–34.0)
MCHC: 34.5 g/dL (ref 32.0–36.0)
MCV: 93.9 fL (ref 80.0–100.0)
Monocytes Absolute: 0.6 10*3/uL (ref 0.2–0.9)
Monocytes Relative: 9 %
Neutro Abs: 4.6 10*3/uL (ref 1.4–6.5)
Neutrophils Relative %: 71 %
Platelets: 296 10*3/uL (ref 150–440)
RBC: 4.15 MIL/uL (ref 3.80–5.20)
RDW: 14.1 % (ref 11.5–14.5)
WBC: 6.5 10*3/uL (ref 3.6–11.0)

## 2017-12-18 NOTE — Progress Notes (Signed)
AVSS. Comfortable. Mild right sided discomfort. Tolerated advance to full liquids. BM x 2 since yesterday. Lungs: Clear. Cardio: RR. ABD: Soft, scant right side tenderness.  Calves: soft (ambulating well). Wound: Clean and dry.  Labs: All WNL. Plan: Advance diet.  Pathology: No residual dysplasia. 0/15 nodes. Home within 24 hours.

## 2017-12-18 NOTE — Progress Notes (Signed)
Patient stated that she had "...another BM.."; stated that she flushed it; described it, placed in I/O flowsheet as unwitnessed; instructed for her not to flush anymore; voiced understanding; Barbaraann Faster, RN 3:50 AM 12/18/2017

## 2017-12-18 NOTE — Care Management Important Message (Signed)
Copy of signed IM left with patient in room.  

## 2017-12-19 NOTE — Discharge Summary (Signed)
Physician Discharge Summary  Patient ID: Teresa Lin MRN: [id number] DOB/AGE: Aug 23, 1931 84 y.o.  Admit date: 12/15/2017 Discharge date: 12/19/2017  Admission Diagnoses: Dysplastic polyp of the right colon.  Discharge Diagnoses: Same  Discharged Condition: good  Hospital Course: Patient underwent colonoscopy with identification of a area of high-grade dysplasia, possibly intramucosal adenocarcinoma.  Given the options for ongoing observation versus resection it was elected to proceed right colectomy.  This was completed the day of admission without incident.  The patient ambulated early and often.  Normal cardiopulmonary exam.  Spontaneous bowel function on postoperative day 2-3.  Diet advance without difficulty.  No need for narcotics after postoperative day 1.  Consults: None  Significant Diagnostic Studies: l pathology showed no residual dysplasia or evidence of a cancer.  Treatments: None Discharge Exam: Blood pressure 134/81, pulse 62, temperature 98 F (36.7 C), temperature source Oral, resp. rate 18, height 5\' 6"  (1.676 m), weight 60.3 kg, SpO2 93 %. General appearance: alert and cooperative Resp: clear to auscultation bilaterally GI: soft, non-tender; bowel sounds normal; no masses,  no organomegaly Incision/Wound: Healing well.  Disposition: Discharge disposition: 01-Home or Self Care       Discharge Instructions    Diet - low sodium heart healthy   Complete by:  As directed    Discharge instructions   Complete by:  As directed    OK to shower. Diet as tolerated. No driving until after office f/u the week of August 19. Tylenol: If needed for soreness.   Increase activity slowly   Complete by:  As directed      Allergies as of 12/19/2017      Reactions   Infliximab Other (See Comments)   Shaking, pain   Synvisc [hylan G-f 20] Swelling      Medication List    STOP taking these medications   neomycin 500 MG tablet Commonly known as:  MYCIFRADIN     TAKE  these medications   alendronate 70 MG tablet Commonly known as:  FOSAMAX Take 70 mg by mouth every Monday. Reported on 11/13/2015   aspirin EC 81 MG tablet Take 81 mg by mouth at bedtime.   calcium-vitamin D 500-200 MG-UNIT tablet Commonly known as:  OSCAL WITH D Take 1 tablet by mouth daily. Reported on 11/13/2015   CULTURELLE PO Take 1 tablet by mouth 4 (four) times a week. Probiotic   folic acid 1 MG tablet Commonly known as:  FOLVITE Take 2 mg by mouth daily.   hydroxychloroquine 200 MG tablet Commonly known as:  PLAQUENIL Take 200 mg by mouth daily.   methotrexate 50 MG/2ML injection Inject 20 mg into the skin every Sunday.   multivitamin with minerals tablet Take 1 tablet by mouth daily. Centrum Silver   PREPARATION H 1-0.25-14.4-15 % Crea Generic drug:  Pramox-PE-Glycerin-Petrolatum Place 1 application rectally daily as needed (for rectal discomfort).   SYSTANE 0.4-0.3 % Soln Generic drug:  Polyethyl Glycol-Propyl Glycol Place 1 drop into both eyes 3 (three) times daily as needed (for eye burning/irritation).        Signed: Forest Gleason Byrnett 12/19/2017, 3:32 PM

## 2017-12-19 NOTE — Care Management (Signed)
Per nursing staff patient is ambulating well, no dressing changes will be required. No RNCM needs identified

## 2017-12-19 NOTE — Progress Notes (Signed)
AVSS. Tolerated diet advance. Lungs: Clear. Cardio:RR. ABD: Soft Wound: Clean and ddry. Ambulating well. Stools firming up. Patient and family aware of pathology.

## 2017-12-23 ENCOUNTER — Other Ambulatory Visit: Payer: Self-pay | Admitting: *Deleted

## 2017-12-23 NOTE — Patient Outreach (Signed)
[facility]) Care Management  12/23/2017  Teresa Lin 1932/03/31 [id number]   EMMI-  General discharge     RED ON EMMI ALERT Day # 1 Date: 12/21/17 1532 Red Alert Reason: scheduled follow up? No    Outreach attempt # 1 successful at her home number  Patient is able to verify HIPAA [place] Management RN reviewed and addressed red alert with patient [name] informed [facility] RN CM after Cm introduced herself that she did not want any more calls or questions from [facility] CM explained to her for general discharge EMMI if she had received her second call, it would be the last one [name] did confirm that she does have a follow up appointment scheduled as indicated in Epic for Dr Bary Castilla, surgeon on January 01 2018 She has a f/u with her primary MD Derrel Nip on 03/19/18 0930 [name] thanked CM for calling and discontinued the call No further needs   Advised patient that there will be further automated EMMI- post discharge calls to assess how the patient is doing following the recent hospitalization Advised the patient that another call may be received from a nurse if any of their responses were abnormal. Patient voiced understanding and was appreciative of f/u call.  Plan: [facility] RN CM will close case at this time as patient has been assessed and no needs identified.    Kimberly L. Lavina Hamman, RN, BSN, [place] Management Care Coordinator Direct Number [phone] Mobile number [phone]  Main [facility] number [phone] Fax number [phone]

## 2017-12-29 DIAGNOSIS — Z79899 Other long term (current) drug therapy: Secondary | ICD-10-CM | POA: Diagnosis not present

## 2017-12-29 DIAGNOSIS — M1990 Unspecified osteoarthritis, unspecified site: Secondary | ICD-10-CM | POA: Diagnosis not present

## 2018-01-01 ENCOUNTER — Encounter: Payer: Self-pay | Admitting: General Surgery

## 2018-01-01 ENCOUNTER — Ambulatory Visit (INDEPENDENT_AMBULATORY_CARE_PROVIDER_SITE_OTHER): Payer: Medicare Other | Admitting: General Surgery

## 2018-01-01 ENCOUNTER — Telehealth: Payer: Self-pay | Admitting: *Deleted

## 2018-01-01 VITALS — BP 124/74 | HR 98 | Resp 14 | Ht 64.0 in | Wt 135.0 lb

## 2018-01-01 DIAGNOSIS — D122 Benign neoplasm of ascending colon: Secondary | ICD-10-CM | POA: Diagnosis not present

## 2018-01-01 NOTE — Telephone Encounter (Signed)
Advised patient that she may drive when she is pain free.

## 2018-01-01 NOTE — Telephone Encounter (Signed)
Patient had surgery on 12/15/17 by Dr.Byrnett and she wants to know when she can start driving again

## 2018-01-01 NOTE — Progress Notes (Signed)
Patient ID: Teresa Lin, female   DOB: Sep 24, 1931, 84 y.o.   MRN: [id number]  Chief Complaint  Patient presents with  . Routine Post Op    P/O LAP RT COLECTOMY    HPI Teresa Lin is a 84 y.o. female here today for her post op colectomy done 12/15/2017. Patient states she fell at week ago.Moving her bowels daily.   The patient reports she has had occasional mild nausea and her son reports some modest weight loss.  This morning for breakfast she had 2 eggs, bacon, toast, juice and coffee.  Lunch was the assortment of fruits and vegetables.  All in all, seems to be tolerating her diet well.  HPI  Past Medical History:  Diagnosis Date  . Anemia   . Arthritis    osteoarthritis. Bilateral knee replacement, hands, meniscal tear, cervical disc disease  . Collagen vascular disease ([place])   . Colon polyps   . Diverticulosis   . DVT (deep venous thrombosis) ([place])   . GERD (gastroesophageal reflux disease)   . Hypertension   . Phlebitis and thrombophlebitis of deep veins of lower extremities (HCC)    after surgery  . Vision abnormalities     Past Surgical History:  Procedure Laterality Date  . APPENDECTOMY    . COLONOSCOPY WITH PROPOFOL N/A 10/24/2017   Procedure: COLONOSCOPY WITH PROPOFOL;  Surgeon: Teresa Silvas, MD;  Location: [facility] ENDOSCOPY;  Service: Endoscopy;  Laterality: N/A;  . COLONOSCOPY WITH PROPOFOL N/A 11/21/2017   Procedure: COLONOSCOPY WITH PROPOFOL;  Surgeon: Teresa Silvas, MD;  Location: [facility] ENDOSCOPY;  Service: Endoscopy;  Laterality: N/A;  . DILATION AND CURETTAGE OF UTERUS    . EYE SURGERY     bilateral catarACT  . JOINT REPLACEMENT Bilateral    knee replacement  . LAPAROSCOPIC RIGHT COLECTOMY Right 12/15/2017   Procedure: LAPAROSCOPIC RIGHT COLECTOMY;  Surgeon: Teresa Bellow, MD;  Location: [facility] ORS;  Service: General;  Laterality: Right;  . REPLACEMENT TOTAL KNEE BILATERAL    . TONSILLECTOMY      Family History  Problem Relation Age of Onset  .  Hypertension Mother   . Cancer Mother 32       colon ca  . Heart attack Mother   . Hypertension Father   . Heart attack Father   . Hypertension Sister   . Heart attack Sister   . Multiple sclerosis Daughter   . Multiple sclerosis Son   . Multiple sclerosis Son     Social History Social History   Tobacco Use  . Smoking status: Never Smoker  . Smokeless tobacco: Never Used  Substance Use Topics  . Alcohol use: Yes    Alcohol/week: 2.0 standard drinks    Types: 2 Cans of beer per week    Comment: occasionally   . Drug use: No    Allergies  Allergen Reactions  . Infliximab Other (See Comments)    Shaking, pain  . Synvisc [Hylan G-F 20] Swelling    Current Outpatient Medications  Medication Sig Dispense Refill  . alendronate (FOSAMAX) 70 MG tablet Take 70 mg by mouth every Monday. Reported on 11/13/2015    . aspirin EC 81 MG tablet Take 81 mg by mouth at bedtime.    . calcium-vitamin D (OSCAL WITH D) 500-200 MG-UNIT per tablet Take 1 tablet by mouth daily. Reported on 01/11/4781    . folic acid (FOLVITE) 1 MG tablet Take 2 mg by mouth daily.     . hydroxychloroquine (PLAQUENIL) 200  MG tablet Take 200 mg by mouth daily.    . Lactobacillus Rhamnosus, GG, (CULTURELLE PO) Take 1 tablet by mouth 4 (four) times a week. Probiotic     . methotrexate 50 MG/2ML injection Inject 20 mg into the skin every Sunday.     . Multiple Vitamins-Minerals (MULTIVITAMIN WITH MINERALS) tablet Take 1 tablet by mouth daily. Centrum Silver    . Polyethyl Glycol-Propyl Glycol (SYSTANE) 0.4-0.3 % SOLN Place 1 drop into both eyes 3 (three) times daily as needed (for eye burning/irritation).    Marland Kitchen PREPARATION H 1-0.25-14.4-15 % CREA Place 1 application rectally daily as needed (for rectal discomfort).     No current facility-administered medications for this visit.     Review of Systems Review of Systems  Constitutional: Negative.   Respiratory: Negative.   Cardiovascular: Negative.     Blood  pressure 124/74, pulse 98, resp. rate 14, height 5\' 4"  (1.626 m), weight 135 lb (61.2 kg).  Physical Exam Physical Exam  Constitutional: She is oriented to person, place, and time. She appears well-developed and well-nourished.  Cardiovascular: Normal rate, regular rhythm and normal heart sounds.  Pulmonary/Chest: Effort normal and breath sounds normal.  Abdominal:    Neurological: She is alert and oriented to person, place, and time.  Skin: Skin is warm and dry.    Data Reviewed PATHOLOGY Surgical Pathology  CASE: [id number]  PATIENT: Teresa Lin  Surgical Pathology Report      SPECIMEN SUBMITTED:  A. Colon, right   CLINICAL HISTORY:  History of sessile serrated adenoma of the ascending colon with  high-grade dysplasia   PRE-OPERATIVE DIAGNOSIS:  Right colon polyp   POST-OPERATIVE DIAGNOSIS:  Same as pre op      DIAGNOSIS:  A. TERMINAL ILEUM AND RIGHT COLON; RIGHT COLECTOMY:  - BIOPSY SITE CHANGES IN THE ASCENDING COLON (2 SITES).  - NO RESIDUAL ADENOMA OR ADDITIONAL POLYPS IDENTIFIED.  - 15 REGIONAL LYMPH NODES WITHOUT PATHOLOGIC CHANGES.  - ABSENT APPENDIX.      Assessment    Doing well status post laparoscopically assisted right colectomy for adenomatous polyp with dysplasia involving the right colon.    Plan  Patient to return in one month. The patient is aware to call back for any questions or concerns.  Reviewed with patient and her son that there are no forbidden foods.  She should continue to eat high nutritional food with protein to help rebuild her body stores.  Continue to be active at home.  HPI, Physical Exam, Assessment and Plan have been scribed under the direction and in the presence of Teresa Ard, MD.  Gaspar Cola, CMA  I have completed the exam and reviewed the above documentation for accuracy and completeness.  I agree with the above.  Haematologist has been used and any errors in dictation or transcription are  unintentional.  Teresa Lin, M.D., F.A.C.S.  Teresa Lin 01/01/2018, 9:03 PM

## 2018-01-01 NOTE — Patient Instructions (Signed)
Patient to return in one month. The patient is aware to call back for any questions or concerns. 

## 2018-01-06 DIAGNOSIS — G8929 Other chronic pain: Secondary | ICD-10-CM | POA: Diagnosis not present

## 2018-01-06 DIAGNOSIS — M25571 Pain in right ankle and joints of right foot: Secondary | ICD-10-CM | POA: Diagnosis not present

## 2018-01-06 DIAGNOSIS — Z79899 Other long term (current) drug therapy: Secondary | ICD-10-CM | POA: Diagnosis not present

## 2018-01-06 DIAGNOSIS — M0609 Rheumatoid arthritis without rheumatoid factor, multiple sites: Secondary | ICD-10-CM | POA: Diagnosis not present

## 2018-01-13 ENCOUNTER — Telehealth: Payer: Self-pay

## 2018-01-13 NOTE — Telephone Encounter (Signed)
Copied from [place] [id number]. Topic: Quick Communication - See Telephone Encounter >> Jan 13, 2018  5:02 PM Antonieta Iba C wrote: CRM for notification. See Telephone encounter for: 01/13/18.  Pt called in to reschedule her apt for tomorrow at 3. Pt says that she would like to come in on Friday at 3 instead. Please assist pt with scheduling.  Patient scheduled to see Arnett on 01-16-18 at 3:00

## 2018-01-14 ENCOUNTER — Ambulatory Visit: Payer: Medicare Other | Admitting: Family

## 2018-01-15 DIAGNOSIS — M25571 Pain in right ankle and joints of right foot: Secondary | ICD-10-CM | POA: Diagnosis not present

## 2018-01-15 DIAGNOSIS — M0609 Rheumatoid arthritis without rheumatoid factor, multiple sites: Secondary | ICD-10-CM | POA: Diagnosis not present

## 2018-01-16 ENCOUNTER — Encounter: Payer: Self-pay | Admitting: Family

## 2018-01-16 ENCOUNTER — Ambulatory Visit (INDEPENDENT_AMBULATORY_CARE_PROVIDER_SITE_OTHER): Payer: Medicare Other | Admitting: Family

## 2018-01-16 VITALS — BP 140/64 | HR 96 | Temp 98.1°F | Resp 16 | Wt 137.1 lb

## 2018-01-16 DIAGNOSIS — R5383 Other fatigue: Secondary | ICD-10-CM | POA: Diagnosis not present

## 2018-01-16 NOTE — Patient Instructions (Addendum)
Labs   Today we discussed referrals, orders. Sleep study   I have placed these orders in the system for you.  Please be sure to give [place] a call if you have not heard from our office regarding scheduling a test or regarding referral in a timely manner.  It is very important that you let me know as soon as possible.    Let us know if fatigue persists or doesn't resolve  Start walking 15 minutes once per day as long as ankle pain has improved.   Suspect fatigue is related to change in activity level.   Please make sooner appointment with dr Derrel Nip

## 2018-01-16 NOTE — Progress Notes (Signed)
Subjective:    Patient ID: Teresa Lin, female    DOB: 1932-04-24, 84 y.o.   MRN: [id number]  CC: Teresa Lin is a 84 y.o. female who presents today for an acute visit.    HPI: CC: fatigue 4 weeks, improved today. Onset shortly after colon resection surgery.   Has started prednisone today from Dr Jefm Bryant. No exercise at this time due to right ankle pain ( improved since seeing Dr Felipa Evener) which is unusual for her.    Prior to surgery, walked 15 minutes TID however has stopped past couple of weeks due to ankle pain. Sleeps well. Thinks snores. Has never had sleep study.   She also states she has had a sore throat x one week, improving. Endorses dry cough , clear congestion, improving also. No fever, le edema, SOB, wheezing.   RA- Current 'flare'. Recently seen by her rheumatologist and started on prednisone. Patient states started it yesterday  No CP. Follows with Dr Josefa Half.   12/15/17 - colectomy Dr Terri Piedra for right colon polyp serrated adenoma of high grade dysplasia. Followed with Dr Vira Agar HISTORY:  Past Medical History:  Diagnosis Date  . Anemia   . Arthritis    osteoarthritis. Bilateral knee replacement, hands, meniscal tear, cervical disc disease  . Collagen vascular disease ([place])   . Colon polyps   . Diverticulosis   . DVT (deep venous thrombosis) ([place])   . GERD (gastroesophageal reflux disease)   . Hypertension   . Phlebitis and thrombophlebitis of deep veins of lower extremities (HCC)    after surgery  . Vision abnormalities    Past Surgical History:  Procedure Laterality Date  . APPENDECTOMY    . COLONOSCOPY WITH PROPOFOL N/A 10/24/2017   Procedure: COLONOSCOPY WITH PROPOFOL;  Surgeon: Manya Silvas, MD;  Location[facility] ENDOSCOPY;  Service: Endoscopy;  Laterality: N/A;  . COLONOSCOPY WITH PROPOFOL N/A 11/21/2017   Procedure: COLONOSCOPY WITH PROPOFOL;  Surgeon: Manya Silvas, MD;  Location: [facility] ENDOSCOPY;  Service: Endoscopy;  Laterality: N/A;  .  DILATION AND CURETTAGE OF UTERUS    . EYE SURGERY     bilateral catarACT  . JOINT REPLACEMENT Bilateral    knee replacement  . LAPAROSCOPIC RIGHT COLECTOMY Right 12/15/2017   Procedure: LAPAROSCOPIC RIGHT COLECTOMY;  Surgeon: Robert Bellow, MD;  Location: [facility] ORS;  Service: General;  Laterality: Right;  . REPLACEMENT TOTAL KNEE BILATERAL    . TONSILLECTOMY     Family History  Problem Relation Age of Onset  . Hypertension Mother   . Cancer Mother 79       colon ca  . Heart attack Mother   . Hypertension Father   . Heart attack Father   . Hypertension Sister   . Heart attack Sister   . Multiple sclerosis Daughter   . Multiple sclerosis Son   . Multiple sclerosis Son     Allergies: Infliximab and Synvisc [hylan g-f 20] Current Outpatient Medications on File Prior to Visit  Medication Sig Dispense Refill  . alendronate (FOSAMAX) 70 MG tablet Take 70 mg by mouth every Monday. Reported on 11/13/2015    . aspirin EC 81 MG tablet Take 81 mg by mouth at bedtime.    . calcium-vitamin D (OSCAL WITH D) 500-200 MG-UNIT per tablet Take 1 tablet by mouth daily. Reported on 06/19/348    . folic acid (FOLVITE) 1 MG tablet Take 2 mg by mouth daily.     . hydroxychloroquine (PLAQUENIL) 200 MG tablet Take 200  mg by mouth daily.    . methotrexate 50 MG/2ML injection Inject 20 mg into the skin every Sunday.     . Multiple Vitamins-Minerals (MULTIVITAMIN WITH MINERALS) tablet Take 1 tablet by mouth daily. Centrum Silver    . Polyethyl Glycol-Propyl Glycol (SYSTANE) 0.4-0.3 % SOLN Place 1 drop into both eyes 3 (three) times daily as needed (for eye burning/irritation).    Marland Kitchen PREPARATION H 1-0.25-14.4-15 % CREA Place 1 application rectally daily as needed (for rectal discomfort).     No current facility-administered medications on file prior to visit.     Social History   Tobacco Use  . Smoking status: Never Smoker  . Smokeless tobacco: Never Used  Substance Use Topics  . Alcohol use: Yes     Alcohol/week: 2.0 standard drinks    Types: 2 Cans of beer per week    Comment: occasionally   . Drug use: No    Review of Systems  Constitutional: Positive for fatigue. Negative for chills and fever.  HENT: Positive for sore throat.   Respiratory: Positive for cough. Negative for shortness of breath and wheezing.   Cardiovascular: Negative for chest pain and palpitations.  Gastrointestinal: Negative for nausea and vomiting.      Objective:    BP 140/64 (BP Location: Left Arm, Cuff Size: Normal)   Pulse 96   Temp 98.1 F (36.7 C) (Oral)   Resp 16   Wt 137 lb 2 oz (62.2 kg)   SpO2 97%   BMI 23.54 kg/m  BP Readings from Last 3 Encounters:  01/16/18 140/64  01/01/18 124/74  12/19/17 134/81   Wt Readings from Last 3 Encounters:  01/16/18 137 lb 2 oz (62.2 kg)  01/01/18 135 lb (61.2 kg)  12/15/17 133 lb (60.3 kg)      Physical Exam  Constitutional: She appears well-developed and well-nourished.  HENT:  Head: Normocephalic and atraumatic.  Right Ear: Hearing, tympanic membrane, external ear and ear canal normal. No drainage, swelling or tenderness. No foreign bodies. Tympanic membrane is not erythematous and not bulging. No middle ear effusion. No decreased hearing is noted.  Left Ear: Hearing, tympanic membrane, external ear and ear canal normal. No drainage, swelling or tenderness. No foreign bodies. Tympanic membrane is not erythematous and not bulging.  No middle ear effusion. No decreased hearing is noted.  Nose: Nose normal. No rhinorrhea. Right sinus exhibits no maxillary sinus tenderness and no frontal sinus tenderness. Left sinus exhibits no maxillary sinus tenderness and no frontal sinus tenderness.  Mouth/Throat: Uvula is midline, oropharynx is clear and moist and mucous membranes are normal. No oropharyngeal exudate, posterior oropharyngeal edema, posterior oropharyngeal erythema or tonsillar abscesses.  Eyes: Conjunctivae are normal.  Cardiovascular: Regular  rhythm, normal heart sounds and normal pulses.  No LE palpable cords or masses. No erythema or increased warmth. No asymmetry in calf size when compared bilaterally LE hair growth symmetric and present. No discoloration of varicosities noted. LE warm and palpable pedal pulses.   Pulmonary/Chest: Effort normal and breath sounds normal. She has no wheezes. She has no rhonchi. She has no rales.  Musculoskeletal:       Right ankle: She exhibits swelling. She exhibits normal range of motion and no ecchymosis. No tenderness.       Feet:  Trace swelling noted right lateral malleolus.   Lymphadenopathy:       Head (right side): No submental, no submandibular, no tonsillar, no preauricular, no posterior auricular and no occipital adenopathy present.  Head (left side): No submental, no submandibular, no tonsillar, no preauricular, no posterior auricular and no occipital adenopathy present.    She has no cervical adenopathy.  Neurological: She is alert.  Skin: Skin is warm and dry.  Psychiatric: She has a normal mood and affect. Her speech is normal and behavior is normal. Thought content normal.  Vitals reviewed.      Assessment & Plan:   Problem List Items Addressed This Visit      Other   Other fatigue - Primary    Improved today. Suspect multifactorial . Exercise.  has decreased due to RA flare. Patient well appearing. Weight stable. Benign exam. No adventitius lung sounds.  Pending CMP. Recent CBC 12/29/17, not anemic.  Pending sleep study Advised slow progression exercise if ankle pain allows and close vigilance. We have also scheduled follow up  with PCP 9/19 to further monitor if not improved.        Relevant Orders   Ambulatory referral to Sleep Studies   Comp Met (CMET) (Completed)   TSH (Completed)         I have discontinued Montgomery W. Carbajal's (Lactobacillus Rhamnosus, GG, (CULTURELLE PO)). I am also having her maintain her folic acid, hydroxychloroquine, calcium-vitamin  D, multivitamin with minerals, methotrexate, alendronate, aspirin EC, Polyethyl Glycol-Propyl Glycol, and PREPARATION H.   No orders of the defined types were placed in this encounter.   Return precautions given.   Risks, benefits, and alternatives of the medications and treatment plan prescribed today were discussed, and patient expressed understanding.   Education regarding symptom management and diagnosis given to patient on AVS.  Continue to follow with Crecencio Mc, MD for routine health maintenance.   Lowella Grip and I agreed with plan.   Mable Paris, FNP

## 2018-01-16 NOTE — Assessment & Plan Note (Addendum)
Improved today. Suspect multifactorial . Exercise.  has decreased due to RA flare. Patient well appearing. Weight stable. Benign exam. No adventitius lung sounds.  Pending CMP. Recent CBC 12/29/17, not anemic.  Pending sleep study Advised slow progression exercise if ankle pain allows and close vigilance. We have also scheduled follow up  with PCP 9/19 to further monitor if not improved.

## 2018-01-17 LAB — COMPREHENSIVE METABOLIC PANEL
AG Ratio: 1.6 (calc) (ref 1.0–2.5)
ALT: 12 U/L (ref 6–29)
AST: 19 U/L (ref 10–35)
Albumin: 4.3 g/dL (ref 3.6–5.1)
Alkaline phosphatase (APISO): 46 U/L (ref 33–130)
BUN: 24 mg/dL (ref 7–25)
CO2: 22 mmol/L (ref 20–32)
Calcium: 9.4 mg/dL (ref 8.6–10.4)
Chloride: 103 mmol/L (ref 98–110)
Creat: 0.64 mg/dL (ref 0.60–0.88)
Globulin: 2.7 g/dL (calc) (ref 1.9–3.7)
Glucose, Bld: 197 mg/dL — ABNORMAL HIGH (ref 65–99)
Potassium: 4 mmol/L (ref 3.5–5.3)
Sodium: 138 mmol/L (ref 135–146)
Total Bilirubin: 0.4 mg/dL (ref 0.2–1.2)
Total Protein: 7 g/dL (ref 6.1–8.1)

## 2018-01-17 LAB — TSH: TSH: 0.45 mIU/L (ref 0.40–4.50)

## 2018-01-19 ENCOUNTER — Telehealth: Payer: Self-pay

## 2018-01-19 NOTE — Telephone Encounter (Signed)
LMTCB. Please transfer pt to our office.  

## 2018-01-19 NOTE — Telephone Encounter (Signed)
Copied from [place] [id number]. Topic: General - Other >> Jan 16, 2018  5:51 PM Vernona Rieger wrote: Reason for CRM: Patient said that she was in the office today and an appt was made for 9/19. She said she can not do that and was offered the 11 am on 9/26. It is a hospital f/u slot. Can that be removed and patient put in that slot? She would like a call that it has been done

## 2018-01-21 ENCOUNTER — Other Ambulatory Visit: Payer: Self-pay | Admitting: Oncology

## 2018-01-21 NOTE — Telephone Encounter (Signed)
Spoke with pt and we have rescheduled appt for 01/26/2018 at 8am. Pt is aware of appt date and time.

## 2018-01-22 DIAGNOSIS — E785 Hyperlipidemia, unspecified: Secondary | ICD-10-CM | POA: Diagnosis not present

## 2018-01-22 DIAGNOSIS — I8390 Asymptomatic varicose veins of unspecified lower extremity: Secondary | ICD-10-CM | POA: Diagnosis not present

## 2018-01-22 DIAGNOSIS — I82890 Acute embolism and thrombosis of other specified veins: Secondary | ICD-10-CM | POA: Diagnosis not present

## 2018-01-22 DIAGNOSIS — I708 Atherosclerosis of other arteries: Secondary | ICD-10-CM | POA: Diagnosis not present

## 2018-01-22 DIAGNOSIS — I10 Essential (primary) hypertension: Secondary | ICD-10-CM | POA: Diagnosis not present

## 2018-01-26 ENCOUNTER — Ambulatory Visit (INDEPENDENT_AMBULATORY_CARE_PROVIDER_SITE_OTHER): Payer: Medicare Other | Admitting: Internal Medicine

## 2018-01-26 ENCOUNTER — Encounter: Payer: Self-pay | Admitting: Internal Medicine

## 2018-01-26 VITALS — BP 118/58 | HR 96 | Temp 98.3°F | Resp 15 | Ht 64.0 in | Wt 136.4 lb

## 2018-01-26 DIAGNOSIS — M0600 Rheumatoid arthritis without rheumatoid factor, unspecified site: Secondary | ICD-10-CM

## 2018-01-26 DIAGNOSIS — Z9049 Acquired absence of other specified parts of digestive tract: Secondary | ICD-10-CM | POA: Diagnosis not present

## 2018-01-26 DIAGNOSIS — R296 Repeated falls: Secondary | ICD-10-CM | POA: Diagnosis not present

## 2018-01-26 DIAGNOSIS — R946 Abnormal results of thyroid function studies: Secondary | ICD-10-CM | POA: Diagnosis not present

## 2018-01-26 DIAGNOSIS — J069 Acute upper respiratory infection, unspecified: Secondary | ICD-10-CM

## 2018-01-26 DIAGNOSIS — R5383 Other fatigue: Secondary | ICD-10-CM | POA: Diagnosis not present

## 2018-01-26 DIAGNOSIS — B9789 Other viral agents as the cause of diseases classified elsewhere: Secondary | ICD-10-CM

## 2018-01-26 DIAGNOSIS — M810 Age-related osteoporosis without current pathological fracture: Secondary | ICD-10-CM

## 2018-01-26 DIAGNOSIS — M25571 Pain in right ankle and joints of right foot: Secondary | ICD-10-CM | POA: Diagnosis not present

## 2018-01-26 DIAGNOSIS — M0609 Rheumatoid arthritis without rheumatoid factor, multiple sites: Secondary | ICD-10-CM | POA: Diagnosis not present

## 2018-01-26 LAB — T4, FREE: Free T4: 0.9 ng/dL (ref 0.60–1.60)

## 2018-01-26 LAB — TSH: TSH: 1.42 u[IU]/mL (ref 0.35–4.50)

## 2018-01-26 LAB — VITAMIN B12: Vitamin B-12: 408 pg/mL (ref 211–911)

## 2018-01-26 MED ORDER — PREDNISONE 10 MG PO TABS: ORAL_TABLET | ORAL | 0 refills | Status: DC

## 2018-01-26 NOTE — Assessment & Plan Note (Signed)
Etiology multifactorial.  Recent colonic surgery with weight loss. Poor sleep,  Decreased appetite.  Not anemic ,  But checking iron, b12 stores .

## 2018-01-26 NOTE — Assessment & Plan Note (Signed)
Recurrent flares involving both ankles despite use of methotrexate.  steroids prescribed.

## 2018-01-26 NOTE — Assessment & Plan Note (Addendum)
Secondary to poor balance and decreased safety awareness . 2 since June despite use of walker due to ankle weakness and poor balance.  Encouraged her to resume PT and Tai chi.

## 2018-01-26 NOTE — Progress Notes (Signed)
Subjective:  Patient ID: Teresa Lin, female    DOB: 07/02/1931  Age: 84 y.o. MRN: [id number]  CC: The primary encounter diagnosis was Fatigue, unspecified type. Diagnoses of Abnormal thyroid function test, Osteoporosis, postmenopausal, Viral URI with cough, Frequent falls, S/P right colectomy, and Seronegative rheumatoid arthritis affecting lower leg (HCC) were also pertinent to this visit.  HPI Teresa Lin presents for follow up on multiple issues. Last seen by [name] on Sept 6.   Doesn't feel good. Multiple complaints 1) persistent URI symptoms have been present for n two weeks.  No fevers,  Cough is now minimally productive ,  Nasal drainage is clear.  No sinus or ear pain.  No dyspnea.  Has not tried any antihistamine s.  Treating a stye of left upper eyelid that was first noticed 3 days ago     2)  Arthritis pain: using a walker today.  Has seronegative RA, treated for  tenosynovitis of left ankle with aspiration and steroid injection  On  July 30  by Jefm Bryant.  Symptoms transiently improved on recheck August 27.  but right ankle became acutely swollen and painful, unable to bear weight , was injected on Sept 5.  Did not take  Prednisone until  Sept 5 flare .     Has fallen twice since hospitalization In June for hemicolectomy.  First fall occurred due to tripping  on bedroom  slippers,  2nd time occurred while stepping down one step from fam ily room onto porch  (ankle gave way)     3)  laparoscopic right  colectomy in early August by JB for  Serrated adenoma of the ascending colon with high grade dysplasia .  Having multiple formed stools a little soft,  Daily .  Taking multiple forms of probiotics .   4)  Fatigue :  Has tried to increase her activity level by walking 30 minutes 5 days per weeek  But ankle flare has prevented her from walking since the repeat flare.  Not interested in sleep study,  Trouble falling asleep.   HAS TRIED MELATONIN .  Reviewed labs from [facility].   Iron studies were not done as hgb was normal.  Outpatient Medications Prior to Visit  Medication Sig Dispense Refill  . aspirin EC 81 MG tablet Take 81 mg by mouth at bedtime.    . calcium-vitamin D (OSCAL WITH D) 500-200 MG-UNIT per tablet Take 1 tablet by mouth daily. Reported on 07/19/4663    . folic acid (FOLVITE) 1 MG tablet Take 2 mg by mouth daily.     . hydroxychloroquine (PLAQUENIL) 200 MG tablet Take 200 mg by mouth daily.    . methotrexate 50 MG/2ML injection Inject 20 mg into the skin every Sunday.     . Multiple Vitamins-Minerals (MULTIVITAMIN WITH MINERALS) tablet Take 1 tablet by mouth daily. Centrum Silver    . PREPARATION H 1-0.25-14.4-15 % CREA Place 1 application rectally daily as needed (for rectal discomfort).    Marland Kitchen alendronate (FOSAMAX) 70 MG tablet Take 70 mg by mouth every Monday. Reported on 11/13/2015    . Polyethyl Glycol-Propyl Glycol (SYSTANE) 0.4-0.3 % SOLN Place 1 drop into both eyes 3 (three) times daily as needed (for eye burning/irritation).     No facility-administered medications prior to visit.     Review of Systems;  Patient denies headache, fevers, malaise, unintentional weight loss, skin rash, eye pain, sinus congestion and sinus pain, sore throat, dysphagia,  hemoptysis , cough, dyspnea, wheezing,  chest pain, palpitations, orthopnea, edema, abdominal pain, nausea, melena, diarrhea, constipation, flank pain, dysuria, hematuria, urinary  Frequency, nocturia, numbness, tingling, seizures,  Focal weakness, Loss of consciousness,  Tremor, insomnia, depression, anxiety, and suicidal ideation.      Objective:  BP (!) 118/58 (BP Location: Left Arm, Patient Position: Sitting, Cuff Size: Normal)   Pulse 96   Temp 98.3 F (36.8 C) (Oral)   Resp 15   Ht _0  (1.626 m)   Wt 136 lb 6.4 oz (61.9 kg)   SpO2 97%   BMI 23.41 kg/m   BP Readings from Last 3 Encounters:  01/26/18 (!) 118/58  01/16/18 140/64  01/01/18 124/74    Wt Readings from Last 3  Encounters:  01/26/18 136 lb 6.4 oz (61.9 kg)  01/16/18 137 lb 2 oz (62.2 kg)  01/01/18 135 lb (61.2 kg)    General appearance: alert, cooperative and appears stated age Ears: normal TM's and external ear canals both ears Throat: lips, mucosa, and tongue normal; teeth and gums normal Neck: no adenopathy, no carotid bruit, supple, symmetrical, trachea midline and thyroid not enlarged, symmetric, no tenderness/mass/nodules Back: symmetric, no curvature. ROM normal. No CVA tenderness. Lungs: clear to auscultation bilaterally Heart: regular rate and rhythm, S1, S2 normal, no murmur, click, rub or gallop Abdomen: soft, non-tender; bowel sounds normal; no masses,  no organomegaly Pulses: 2+ and symmetric Skin: Skin color, texture, turgor normal. No rashes or lesions Lymph nodes: Cervical, supraclavicular, and axillary nodes normal. MSK;  Right ankle with effusion,  Slightly warm,   Not red   Lab Results  Component Value Date   HGBA1C 5.6 09/05/2014    Lab Results  Component Value Date   CREATININE 0.64 01/16/2018   CREATININE 0.45 12/18/2017   CREATININE 0.46 07/10/2017    Lab Results  Component Value Date   WBC 6.5 12/18/2017   HGB 13.5 12/18/2017   HCT 38.9 12/18/2017   PLT 296 12/18/2017   GLUCOSE 197 (H) 01/16/2018   CHOL 181 08/28/2015   TRIG 83.0 08/28/2015   HDL 62.90 08/28/2015   LDLCALC 102 (H) 08/28/2015   ALT 12 01/16/2018   AST 19 01/16/2018   NA 138 01/16/2018   K 4.0 01/16/2018   CL 103 01/16/2018   CREATININE 0.64 01/16/2018   BUN 24 01/16/2018   CO2 22 01/16/2018   TSH 1.42 01/26/2018   INR 0.9 08/08/2011   HGBA1C 5.6 09/05/2014    No results found.  Assessment & Plan:   Problem List Items Addressed This Visit    Fatigue - Primary    Etiology multifactorial.  Recent colonic surgery with weight loss. Poor sleep,  Decreased appetite.  Not anemic ,  But checking iron, b12 stores .        Relevant Orders   Iron, TIBC and Ferritin Panel   Vitamin  B12 (Completed)   Frequent falls    Secondary to poor balance and decreased safety awareness . 2 since June despite use of walker due to ankle weakness and poor balance.  Encouraged her to resume PT and Tai chi.      Osteoporosis, postmenopausal    Continue alendronate started by Dr Jefm Bryant       S/P right colectomy    Secondary to recurrent adenomatous polyps with dysplasia June 2019       Seronegative rheumatoid arthritis affecting lower leg (HCC)    Recurrent flares involving both ankles despite use of methotrexate.  steroids prescribed.  Viral URI with cough    Adding benadryl at night for PND and continue daytime anthistamine.  Exam normal.        Other Visit Diagnoses    Abnormal thyroid function test       Relevant Orders   TSH (Completed)   T4, free (Completed)      I am having Lowella Grip start on predniSONE. I am also having her maintain her folic acid, hydroxychloroquine, calcium-vitamin D, multivitamin with minerals, methotrexate, alendronate, aspirin EC, Polyethyl Glycol-Propyl Glycol, and PREPARATION H.  Meds ordered this encounter  Medications  . predniSONE (DELTASONE) 10 MG tablet    Sig: 6 tablets  Daily for 3 days,  then reduce by 1 tablet daily until gone    Dispense:  33 tablet    Refill:  0    There are no discontinued medications.  Follow-up: No follow-ups on file.   Crecencio Mc, MD

## 2018-01-26 NOTE — Assessment & Plan Note (Signed)
Adding benadryl at night for PND and continue daytime anthistamine.  Exam normal.

## 2018-01-26 NOTE — Assessment & Plan Note (Signed)
Secondary to recurrent adenomatous polyps with dysplasia June 2019

## 2018-01-26 NOTE — Patient Instructions (Addendum)
Your fatigue may not be from one problem  It may be multifactorial:  Surgery Pain Poor sleep Overactive thyroid b12 or iron deficiency  The labs today will investigate the thyroid, b12  and iron. If they are normal,  More workup may be needed (sleep study)     For your allergies ,  You can use  children's dose of Benadryl at bedtime only,  (diphenhydramine 12. 5 mg ) but then add   one of these newer second generation antihistamines that are longer acting, non sedating and  available OTC:    generic Allegra , available generically as fexofenadine ; comes in 60 mg and 180 mg once daily strengths.    For the ankle  Prednisone 60 mg daily for 3 days,  Then taper to off ( if you can't get in to see [facility] soon)   Ice the ankle for 15 minutes  every 4 to 6 hours while you are awake   Try  taking up to 6 mg of melatonin after dinner for the insomnia  checking iron and B12 today since all of your other labs were normal

## 2018-01-26 NOTE — Assessment & Plan Note (Signed)
Continue alendronate started by Dr Jefm Bryant

## 2018-01-27 LAB — IRON,TIBC AND FERRITIN PANEL
%SAT: 11 % (calc) — ABNORMAL LOW (ref 16–45)
Ferritin: 138 ng/mL (ref 16–288)
Iron: 27 ug/dL — ABNORMAL LOW (ref 45–160)
TIBC: 244 mcg/dL (calc) — ABNORMAL LOW (ref 250–450)

## 2018-01-29 ENCOUNTER — Ambulatory Visit: Payer: Medicare Other | Admitting: Internal Medicine

## 2018-01-29 ENCOUNTER — Ambulatory Visit: Payer: Medicare Other | Admitting: General Surgery

## 2018-02-06 DIAGNOSIS — Z961 Presence of intraocular lens: Secondary | ICD-10-CM | POA: Diagnosis not present

## 2018-02-06 DIAGNOSIS — Z79899 Other long term (current) drug therapy: Secondary | ICD-10-CM | POA: Diagnosis not present

## 2018-02-06 DIAGNOSIS — H16223 Keratoconjunctivitis sicca, not specified as Sjogren's, bilateral: Secondary | ICD-10-CM | POA: Diagnosis not present

## 2018-02-12 ENCOUNTER — Encounter: Payer: Self-pay | Admitting: General Surgery

## 2018-02-12 ENCOUNTER — Ambulatory Visit (INDEPENDENT_AMBULATORY_CARE_PROVIDER_SITE_OTHER): Payer: Medicare Other | Admitting: General Surgery

## 2018-02-12 VITALS — BP 138/79 | HR 76 | Resp 14 | Ht 64.0 in | Wt 139.4 lb

## 2018-02-12 DIAGNOSIS — D122 Benign neoplasm of ascending colon: Secondary | ICD-10-CM

## 2018-02-12 NOTE — Patient Instructions (Signed)
  Recommend taking the iron tablet every 3 days. The patient is aware to call back for any questions or new concerns.

## 2018-02-12 NOTE — Progress Notes (Signed)
Patient ID: Teresa Lin, female   DOB: 09-10-1931, 84 y.o.   MRN: [id number]  Chief Complaint  Patient presents with  . Routine Post Op    HPI Teresa Lin is a 84 y.o. female here for a post op follow up for a right colectomy done on 12/15/17. She reports that she is doing well. Eating and using the bathroom. She reports some of her stools seem black early in the morning, later in day not so much. Appetite is fair. She states Dr Derrel Nip told her her iron was low, she was placed on an iron tablet which was constipating her so she stopped it.  HPI  Past Medical History:  Diagnosis Date  . Anemia   . Arthritis    osteoarthritis. Bilateral knee replacement, hands, meniscal tear, cervical disc disease  . Collagen vascular disease ([place])   . Colon polyp 12/15/2017  . Colon polyps   . Diverticulosis   . DVT (deep venous thrombosis) ([place])   . GERD (gastroesophageal reflux disease)   . Hypertension   . Phlebitis and thrombophlebitis of deep veins of lower extremities (HCC)    after surgery  . Vision abnormalities     Past Surgical History:  Procedure Laterality Date  . APPENDECTOMY    . COLONOSCOPY WITH PROPOFOL N/A 10/24/2017   Procedure: COLONOSCOPY WITH PROPOFOL;  Surgeon: Manya Silvas, MD;  Location: [facility] ENDOSCOPY;  Service: Endoscopy;  Laterality: N/A;  . COLONOSCOPY WITH PROPOFOL N/A 11/21/2017   Procedure: COLONOSCOPY WITH PROPOFOL;  Surgeon: Manya Silvas, MD;  Location: [facility] ENDOSCOPY;  Service: Endoscopy;  Laterality: N/A;  . DILATION AND CURETTAGE OF UTERUS    . EYE SURGERY     bilateral catarACT  . JOINT REPLACEMENT Bilateral    knee replacement  . LAPAROSCOPIC RIGHT COLECTOMY Right 12/15/2017   Procedure: LAPAROSCOPIC RIGHT COLECTOMY;  Surgeon: Robert Bellow, MD;  Location: [facility] ORS;  Service: General;  Laterality: Right;  . REPLACEMENT TOTAL KNEE BILATERAL    . TONSILLECTOMY      Family History  Problem Relation Age of Onset  . Hypertension Mother   . Cancer  Mother 77       colon ca  . Heart attack Mother   . Hypertension Father   . Heart attack Father   . Hypertension Sister   . Heart attack Sister   . Multiple sclerosis Daughter   . Multiple sclerosis Son   . Multiple sclerosis Son     Social History Social History   Tobacco Use  . Smoking status: Never Smoker  . Smokeless tobacco: Never Used  Substance Use Topics  . Alcohol use: Yes    Alcohol/week: 2.0 standard drinks    Types: 2 Cans of beer per week    Comment: occasionally   . Drug use: No    Allergies  Allergen Reactions  . Infliximab Other (See Comments)    Shaking, pain  . Synvisc [Hylan G-F 20] Swelling    Current Outpatient Medications  Medication Sig Dispense Refill  . alendronate (FOSAMAX) 70 MG tablet Take 70 mg by mouth every Monday. Reported on 11/13/2015    . aspirin EC 81 MG tablet Take 81 mg by mouth at bedtime.    . calcium-vitamin D (OSCAL WITH D) 500-200 MG-UNIT per tablet Take 1 tablet by mouth daily. Reported on 10/13/1306    . folic acid (FOLVITE) 1 MG tablet Take 2 mg by mouth daily.     . hydroxychloroquine (PLAQUENIL) 200 MG tablet  Take 200 mg by mouth daily.    . methotrexate 50 MG/2ML injection Inject 20 mg into the skin every Sunday.     . Multiple Vitamins-Minerals (MULTIVITAMIN WITH MINERALS) tablet Take 1 tablet by mouth daily. Centrum Silver    . Polyethyl Glycol-Propyl Glycol (SYSTANE) 0.4-0.3 % SOLN Place 1 drop into both eyes 3 (three) times daily as needed (for eye burning/irritation).    Marland Kitchen PREPARATION H 1-0.25-14.4-15 % CREA Place 1 application rectally daily as needed (for rectal discomfort).     No current facility-administered medications for this visit.     Review of Systems Review of Systems  Constitutional: Negative.   Respiratory: Negative.   Cardiovascular: Negative.   Gastrointestinal: Negative for abdominal pain, nausea and vomiting.    Blood pressure 138/79, pulse 76, resp. rate 14, height 5\' 4"  (1.626 m), weight 139  lb 6.4 oz (63.2 kg), SpO2 98 %.  Physical Exam Physical Exam  Constitutional: She is oriented to person, place, and time. She appears well-developed and well-nourished.  Cardiovascular: Normal rate, regular rhythm and normal heart sounds.  Pulmonary/Chest: Effort normal and breath sounds normal.  Abdominal: Soft. There is no tenderness. No hernia.    Abdominal incision well healed.  Neurological: She is alert and oriented to person, place, and time.  Skin: Skin is warm and dry.  Psychiatric: Her behavior is normal.    Data Reviewed   Ref Range & Units 2wk ago  Iron 45 - 160 mcg/dL 27Low    TIBC 250 - 450 mcg/dL (calc) 244Low    %SAT 16 - 45 % (calc) 11Low    Ferritin 16 - 288 ng/mL 138    December 18, 2017 CBC showed a hemoglobin of 13.5 with an MCV of 93.9, white blood cell count 6500 with normal differential, platelet count of 296,000.  December 29, 2017 CBC showed a hemoglobin of 12.4 with an MCV of 97.7, white blood cell count 9200.  Record review does not indicate that she has had an upper endoscopy in the last few years.  December 15, 2017 pathology: DIAGNOSIS:  A. TERMINAL ILEUM AND RIGHT COLON; RIGHT COLECTOMY:  - BIOPSY SITE CHANGES IN THE ASCENDING COLON (2 SITES).  - NO RESIDUAL ADENOMA OR ADDITIONAL POLYPS IDENTIFIED.  - 15 REGIONAL LYMPH NODES WITHOUT PATHOLOGIC CHANGES.  - ABSENT APPENDIX.   Assessment    Doing well post right hemicolectomy for polyp with atypical changes.  Report of black bowel movements each morning preceding surgery or recent addition of iron therapy.    Plan    Recommend taking the iron tablet every 3 days days prior to minimize change in bowel habits.  I spoke with Dr. Vira Agar by phone to let him know of the patient's report of intermittent black stools.  He will make arrangements for upper endoscopy.  Follow up here as needed.     HPI, Physical Exam, Assessment and Plan have been scribed under the direction and in the presence of  Robert Bellow, MD. Karie Fetch, RN  I have completed the exam and reviewed the above documentation for accuracy and completeness.  I agree with the above.  Haematologist has been used and any errors in dictation or transcription are unintentional.  Hervey Ard, M.D., F.A.C.S.   Forest Gleason Byrnett 02/12/2018, 9:50 AM

## 2018-02-25 ENCOUNTER — Encounter: Payer: Self-pay | Admitting: Anesthesiology

## 2018-02-25 ENCOUNTER — Ambulatory Visit: Payer: Medicare Other | Admitting: Certified Registered Nurse Anesthetist

## 2018-02-25 ENCOUNTER — Encounter
Admission: RE | Disposition: A | Discharge status: Home / Self Care | Payer: Self-pay | Source: Ambulatory Visit | Attending: Unknown Physician Specialty

## 2018-02-25 ENCOUNTER — Ambulatory Visit
Admission: RE | Admit: 2018-02-25 | Discharge: 2018-02-25 | Disposition: A | Discharge status: Home / Self Care | Payer: Medicare Other | Source: Ambulatory Visit | Attending: Unknown Physician Specialty | Admitting: Unknown Physician Specialty

## 2018-02-25 DIAGNOSIS — K2960 Other gastritis without bleeding: Secondary | ICD-10-CM | POA: Diagnosis not present

## 2018-02-25 DIAGNOSIS — Z7982 Long term (current) use of aspirin: Secondary | ICD-10-CM | POA: Diagnosis not present

## 2018-02-25 DIAGNOSIS — Z86718 Personal history of other venous thrombosis and embolism: Secondary | ICD-10-CM | POA: Diagnosis not present

## 2018-02-25 DIAGNOSIS — I10 Essential (primary) hypertension: Secondary | ICD-10-CM | POA: Diagnosis not present

## 2018-02-25 DIAGNOSIS — K449 Diaphragmatic hernia without obstruction or gangrene: Secondary | ICD-10-CM | POA: Insufficient documentation

## 2018-02-25 DIAGNOSIS — F419 Anxiety disorder, unspecified: Secondary | ICD-10-CM | POA: Diagnosis not present

## 2018-02-25 DIAGNOSIS — K2970 Gastritis, unspecified, without bleeding: Secondary | ICD-10-CM | POA: Diagnosis not present

## 2018-02-25 DIAGNOSIS — I739 Peripheral vascular disease, unspecified: Secondary | ICD-10-CM | POA: Diagnosis not present

## 2018-02-25 DIAGNOSIS — Z7983 Long term (current) use of bisphosphonates: Secondary | ICD-10-CM | POA: Diagnosis not present

## 2018-02-25 DIAGNOSIS — Z79899 Other long term (current) drug therapy: Secondary | ICD-10-CM | POA: Insufficient documentation

## 2018-02-25 DIAGNOSIS — Z800 Family history of malignant neoplasm of digestive organs: Secondary | ICD-10-CM | POA: Insufficient documentation

## 2018-02-25 DIAGNOSIS — R1310 Dysphagia, unspecified: Secondary | ICD-10-CM | POA: Diagnosis not present

## 2018-02-25 DIAGNOSIS — Z96653 Presence of artificial knee joint, bilateral: Secondary | ICD-10-CM | POA: Insufficient documentation

## 2018-02-25 DIAGNOSIS — R109 Unspecified abdominal pain: Secondary | ICD-10-CM | POA: Diagnosis not present

## 2018-02-25 DIAGNOSIS — K921 Melena: Secondary | ICD-10-CM | POA: Diagnosis not present

## 2018-02-25 DIAGNOSIS — K219 Gastro-esophageal reflux disease without esophagitis: Secondary | ICD-10-CM | POA: Insufficient documentation

## 2018-02-25 DIAGNOSIS — K2950 Unspecified chronic gastritis without bleeding: Secondary | ICD-10-CM | POA: Diagnosis not present

## 2018-02-25 HISTORY — DX: Anxiety disorder, unspecified: F41.9

## 2018-02-25 HISTORY — PX: ESOPHAGOGASTRODUODENOSCOPY (EGD) WITH PROPOFOL: SHX5813

## 2018-02-25 HISTORY — DX: Irritable bowel syndrome, unspecified: K58.9

## 2018-02-25 HISTORY — DX: Wedge compression fracture of second lumbar vertebra, initial encounter for closed fracture: S32.020A

## 2018-02-25 HISTORY — DX: Other hemorrhoids: K64.8

## 2018-02-25 HISTORY — DX: Unspecified atherosclerosis: I70.90

## 2018-02-25 HISTORY — DX: Personal history of other diseases of the musculoskeletal system and connective tissue: Z87.39

## 2018-02-25 HISTORY — DX: Other chest pain: R07.89

## 2018-02-25 SURGERY — ESOPHAGOGASTRODUODENOSCOPY (EGD) WITH PROPOFOL
Anesthesia: General

## 2018-02-25 MED ORDER — PROPOFOL 500 MG/50ML IV EMUL
INTRAVENOUS | Status: DC | PRN
  Administered 2018-02-25: 70 ug/kg/min via INTRAVENOUS

## 2018-02-25 MED ORDER — PROPOFOL 10 MG/ML IV BOLUS
INTRAVENOUS | Status: DC | PRN
  Administered 2018-02-25: 70 mg via INTRAVENOUS

## 2018-02-25 MED ORDER — PIPERACILLIN-TAZOBACTAM 3.375 G IVPB
3.3750 g | Freq: Once | INTRAVENOUS | Status: DC
  Administered 2018-02-25: 3.375 g via INTRAVENOUS

## 2018-02-25 MED ORDER — PIPERACILLIN-TAZOBACTAM 3.375 G IVPB
INTRAVENOUS | Status: AC
  Filled 2018-02-25: qty 50

## 2018-02-25 MED ORDER — LIDOCAINE HCL (CARDIAC) PF 100 MG/5ML IV SOSY
PREFILLED_SYRINGE | INTRAVENOUS | Status: DC | PRN
  Administered 2018-02-25: 100 mg via INTRAVENOUS

## 2018-02-25 MED ORDER — SODIUM CHLORIDE 0.9 % IV SOLN
INTRAVENOUS | Status: DC
  Administered 2018-02-25: 1000 mL via INTRAVENOUS

## 2018-02-25 MED ORDER — SODIUM CHLORIDE 0.9 % IV SOLN: INTRAVENOUS | Status: DC

## 2018-02-25 NOTE — Transfer of Care (Signed)
Immediate Anesthesia Transfer of Care Note  Patient: Teresa Lin  Procedure(s) Performed: ESOPHAGOGASTRODUODENOSCOPY (EGD) WITH PROPOFOL (N/A )  Patient Location: PACU  Anesthesia Type:General  Level of Consciousness: sedated  Airway & Oxygen Therapy: Patient Spontanous Breathing and Patient connected to nasal cannula oxygen  Post-op Assessment: Report given to RN and Post -op Vital signs reviewed and stable  Post vital signs: Reviewed and stable  Last Vitals:  Vitals Value Taken Time  BP    Temp    Pulse 75 02/25/2018 10:22 AM  Resp 24 02/25/2018 10:22 AM  SpO2 100 % 02/25/2018 10:22 AM  Vitals shown include unvalidated device data.  Last Pain:  Vitals:   02/25/18 0945  TempSrc: Tympanic         Complications: No apparent anesthesia complications

## 2018-02-25 NOTE — Anesthesia Postprocedure Evaluation (Signed)
Anesthesia Post Note  Patient: QUATISHA ZYLKA  Procedure(s) Performed: ESOPHAGOGASTRODUODENOSCOPY (EGD) WITH PROPOFOL (N/A )  Patient location during evaluation: Endoscopy Anesthesia Type: General Level of consciousness: awake and alert Pain management: pain level controlled Vital Signs Assessment: post-procedure vital signs reviewed and stable Respiratory status: spontaneous breathing, nonlabored ventilation, respiratory function stable and patient connected to nasal cannula oxygen Cardiovascular status: blood pressure returned to baseline and stable Postop Assessment: no apparent nausea or vomiting Anesthetic complications: no     Last Vitals:  Vitals:   02/25/18 1030 02/25/18 1040  BP: 130/85 (!) 151/72  Pulse: 73 71  Resp: 17 (!) 21  Temp:    SpO2: 98% 99%    Last Pain:  Vitals:   02/25/18 1020  TempSrc: Tympanic                 Martha Clan

## 2018-02-25 NOTE — Op Note (Signed)
[facility] Patient Name: Elira Colasanti Procedure Date: 02/25/2018 9:51 AM MRN: [id number] Account #: [id number] Date of Birth: 11/10/1931 Admit Type: Outpatient Age: 84 Room: [facility] ENDO ROOM 2 Gender: Female Note Status: Finalized Procedure:            Upper GI endoscopy Indications:          Abdominal pain, Dysphagia Providers:            Manya Silvas, MD Referring MD:         Deborra Medina, MD (Referring MD) Medicines:            Propofol per Anesthesia Complications:        No immediate complications. Procedure:            Pre-Anesthesia Assessment:                       - After reviewing the risks and benefits, the patient                        was deemed in satisfactory condition to undergo the                        procedure.                       After obtaining informed consent, the endoscope was                        passed under direct vision. Throughout the procedure,                        the patient's blood pressure, pulse, and oxygen                        saturations were monitored continuously. The Endoscope                        was introduced through the mouth, and advanced to the                        second part of duodenum. The upper GI endoscopy was                        accomplished without difficulty. The patient tolerated                        the procedure well. Findings:      The esophagus was normal. GEJ 40cm.      A small-medium-sized hiatal hernia was present.      Patchy mildly erythematous mucosa without bleeding was found in the       gastric body and in the gastric antrum. Biopsies were taken with a cold       forceps for histology. Biopsies were taken with a cold forceps for       Helicobacter pylori testing.      The examined duodenum was normal. Impression:           - Normal esophagus.                       - Medium-sized hiatal hernia.                       -  Erythematous mucosa in the gastric  body and antrum.                        Biopsied.                       - Normal examined duodenum. Recommendation:       - Await pathology results. Manya Silvas, MD 02/25/2018 10:23:57 AM This report has been signed electronically. Number of Addenda: 0 Note Initiated On: 02/25/2018 9:51 AM      [facility]

## 2018-02-25 NOTE — Anesthesia Preprocedure Evaluation (Signed)
Anesthesia Evaluation  Patient identified by MRN, date of birth, ID band Patient awake    Reviewed: Allergy & Precautions, H&P , NPO status , Patient's Chart, lab work & pertinent test results, reviewed documented beta blocker date and time   History of Anesthesia Complications Negative for: history of anesthetic complications  Airway Mallampati: II   Neck ROM: full    Dental  (+) Poor Dentition   Pulmonary neg pulmonary ROS,           Cardiovascular Exercise Tolerance: Good hypertension, On Medications + Peripheral Vascular Disease  negative cardio ROS       Neuro/Psych PSYCHIATRIC DISORDERS Anxiety negative neurological ROS  negative psych ROS   GI/Hepatic negative GI ROS, Neg liver ROS, GERD  Medicated,  Endo/Other  negative endocrine ROS  Renal/GU negative Renal ROS  negative genitourinary   Musculoskeletal   Abdominal   Peds  Hematology  (+) Blood dyscrasia, anemia ,   Anesthesia Other Findings Past Medical History: No date: Anemia No date: Arthritis     Comment:  osteoarthritis. Bilateral knee replacement, hands,               meniscal tear, cervical disc disease No date: Collagen vascular disease (HCC) No date: Colon polyps No date: Diverticulosis No date: DVT (deep venous thrombosis) (HCC) No date: GERD (gastroesophageal reflux disease) No date: Hypertension No date: Phlebitis and thrombophlebitis of deep veins of lower  extremities (HCC)     Comment:  after surgery No date: Vision abnormalities Past Surgical History: No date: APPENDECTOMY No date: DILATION AND CURETTAGE OF UTERUS No date: EYE SURGERY     Comment:  bilateral catarACT No date: JOINT REPLACEMENT; Bilateral     Comment:  knee replacement No date: TONSILLECTOMY BMI    Body Mass Index:  22.96 kg/m     Reproductive/Obstetrics negative OB ROS                             Anesthesia  Physical  Anesthesia Plan  ASA: III  Anesthesia Plan: General   Post-op Pain Management:    Induction: Intravenous  PONV Risk Score and Plan: 3 and Propofol infusion and TIVA  Airway Management Planned: Natural Airway and Nasal Cannula  Additional Equipment:   Intra-op Plan:   Post-operative Plan:   Informed Consent: I have reviewed the patients History and Physical, chart, labs and discussed the procedure including the risks, benefits and alternatives for the proposed anesthesia with the patient or authorized representative who has indicated his/her understanding and acceptance.   Dental Advisory Given  Plan Discussed with: CRNA  Anesthesia Plan Comments:         Anesthesia Quick Evaluation

## 2018-02-25 NOTE — Anesthesia Post-op Follow-up Note (Signed)
Anesthesia QCDR form completed.        

## 2018-02-25 NOTE — H&P (Signed)
Primary Care Physician:  Crecencio Mc, MD Primary Gastroenterologist:  Dr. Vira Agar  Pre-Procedure History & Physical: HPI:  Teresa Lin is a 84 y.o. female is here for an endoscopy.  This is being done for abdominal pain.   Past Medical History:  Diagnosis Date  . Anemia   . Anxiety   . Arthritis    osteoarthritis. Bilateral knee replacement, hands, meniscal tear, cervical disc disease  . Atherosclerosis   . Atypical chest pain   . Collagen vascular disease ([place])   . Colon polyp 12/15/2017  . Colon polyps   . Compression fracture of L2 ([place])   . Diverticulosis   . DVT (deep venous thrombosis) ([place])   . GERD (gastroesophageal reflux disease)   . History of seronegative inflammatory arthritis   . Hypertension   . IBS (irritable bowel syndrome)   . Internal hemorrhoids   . Phlebitis and thrombophlebitis of deep veins of lower extremities (HCC)    after surgery  . Vision abnormalities     Past Surgical History:  Procedure Laterality Date  . APPENDECTOMY    . COLONOSCOPY WITH PROPOFOL N/A 10/24/2017   Procedure: COLONOSCOPY WITH PROPOFOL;  Surgeon: Manya Silvas, MD;  Location: [facility] ENDOSCOPY;  Service: Endoscopy;  Laterality: N/A;  . COLONOSCOPY WITH PROPOFOL N/A 11/21/2017   Procedure: COLONOSCOPY WITH PROPOFOL;  Surgeon: Manya Silvas, MD;  Location: [facility] ENDOSCOPY;  Service: Endoscopy;  Laterality: N/A;  . DILATION AND CURETTAGE OF UTERUS    . EYE SURGERY     bilateral catarACT  . JOINT REPLACEMENT Bilateral    knee replacement  . LAPAROSCOPIC RIGHT COLECTOMY Right 12/15/2017   Procedure: LAPAROSCOPIC RIGHT COLECTOMY;  Surgeon: Robert Bellow, MD;  Location: [facility] ORS;  Service: General;  Laterality: Right;  . REPLACEMENT TOTAL KNEE BILATERAL    . TONSILLECTOMY      Prior to Admission medications   Medication Sig Start Date End Date Taking? Authorizing Provider  alendronate (FOSAMAX) 70 MG tablet Take 70 mg by mouth every Monday. Reported on 11/13/2015  06/14/15   [provider]  aspirin EC 81 MG tablet Take 81 mg by mouth at bedtime.    [provider]  calcium-vitamin D (OSCAL WITH D) 500-200 MG-UNIT per tablet Take 1 tablet by mouth daily. Reported on 11/13/2015    [provider]  folic acid (FOLVITE) 1 MG tablet Take 2 mg by mouth daily.     [provider]  hydroxychloroquine (PLAQUENIL) 200 MG tablet Take 200 mg by mouth daily.    [provider]  methotrexate 50 MG/2ML injection Inject 20 mg into the skin every Sunday.  03/16/15   [provider]  Multiple Vitamins-Minerals (MULTIVITAMIN WITH MINERALS) tablet Take 1 tablet by mouth daily. Centrum Silver    [provider]  Polyethyl Glycol-Propyl Glycol (SYSTANE) 0.4-0.3 % SOLN Place 1 drop into both eyes 3 (three) times daily as needed (for eye burning/irritation).    [provider]  PREPARATION H 1-0.25-14.4-15 % CREA Place 1 application rectally daily as needed (for rectal discomfort).    [provider]    Allergies as of 02/18/2018 - Review Complete 02/12/2018  Allergen Reaction Noted  . Infliximab Other (See Comments) 01/10/2014  . Synvisc [hylan g-f 20] Swelling 03/02/2013    Family History  Problem Relation Age of Onset  . Hypertension Mother   . Cancer Mother 66       colon ca  . Heart attack Mother   .  Hypertension Father   . Heart attack Father   . Hypertension Sister   . Heart attack Sister   . Multiple sclerosis Daughter   . Multiple sclerosis Son   . Multiple sclerosis Son     Social History   Socioeconomic History  . Marital status: Widowed    Spouse name: Not on file  . Number of children: Not on file  . Years of education: Not on file  . Highest education level: Not on file  Occupational History  . Not on file  Social Needs  . Financial resource strain: Not hard at all  . Food insecurity:    Worry: Never true    Inability: Never true  . Transportation needs:     Medical: No    Non-medical: No  Tobacco Use  . Smoking status: Never Smoker  . Smokeless tobacco: Never Used  Substance and Sexual Activity  . Alcohol use: Yes    Alcohol/week: 2.0 standard drinks    Types: 2 Cans of beer per week    Comment: occasionally   . Drug use: No  . Sexual activity: Never  Lifestyle  . Physical activity:    Days per week: 3 days    Minutes per session: 60 min  . Stress: Only a little  Relationships  . Social connections:    Talks on phone: Not on file    Gets together: Not on file    Attends religious service: Not on file    Active member of club or organization: Not on file    Attends meetings of clubs or organizations: Not on file    Relationship status: Not on file  . Intimate partner violence:    Fear of current or ex partner: Not on file    Emotionally abused: Not on file    Physically abused: Not on file    Forced sexual activity: Not on file  Other Topics Concern  . Not on file  Social History Narrative  . Not on file    Review of Systems: See HPI, otherwise negative ROS  Physical Exam: BP (!) 146/78   Pulse 77   Temp (!) 97.3 F (36.3 C) (Tympanic)   Resp 18   Ht 5\' 4"  (1.626 m)   Wt 62.6 kg   SpO2 100%   BMI 23.69 kg/m  General:   Alert,  pleasant and cooperative in NAD Head:  Normocephalic and atraumatic. Neck:  Supple; no masses or thyromegaly. Lungs:  Clear throughout to auscultation.    Heart:  Regular rate and rhythm. Abdomen:  Soft, nontender and nondistended. Normal bowel sounds, without guarding, and without rebound.   Neurologic:  Alert and  oriented x4;  grossly normal neurologically.  Impression/Plan: EMMELY BITTINGER is here for an endoscopy to be performed for abdominal pain.  Risks, benefits, limitations, and alternatives regarding  endoscopy have been reviewed with the patient.  Questions have been answered.  All parties agreeable.   Gaylyn Cheers, MD  02/25/2018, 10:05 AM

## 2018-02-26 ENCOUNTER — Encounter: Payer: Self-pay | Admitting: Unknown Physician Specialty

## 2018-02-26 LAB — SURGICAL PATHOLOGY

## 2018-03-10 DIAGNOSIS — Z23 Encounter for immunization: Secondary | ICD-10-CM | POA: Diagnosis not present

## 2018-03-10 DIAGNOSIS — M810 Age-related osteoporosis without current pathological fracture: Secondary | ICD-10-CM | POA: Diagnosis not present

## 2018-03-10 DIAGNOSIS — Z800 Family history of malignant neoplasm of digestive organs: Secondary | ICD-10-CM | POA: Diagnosis not present

## 2018-03-10 DIAGNOSIS — M1990 Unspecified osteoarthritis, unspecified site: Secondary | ICD-10-CM | POA: Diagnosis not present

## 2018-03-19 ENCOUNTER — Ambulatory Visit: Payer: Medicare Other | Admitting: Internal Medicine

## 2018-04-13 DIAGNOSIS — R42 Dizziness and giddiness: Secondary | ICD-10-CM | POA: Diagnosis not present

## 2018-04-13 DIAGNOSIS — G3184 Mild cognitive impairment, so stated: Secondary | ICD-10-CM | POA: Diagnosis not present

## 2018-04-13 DIAGNOSIS — G4700 Insomnia, unspecified: Secondary | ICD-10-CM | POA: Diagnosis not present

## 2018-04-15 DIAGNOSIS — M0609 Rheumatoid arthritis without rheumatoid factor, multiple sites: Secondary | ICD-10-CM | POA: Diagnosis not present

## 2018-04-15 DIAGNOSIS — Z79899 Other long term (current) drug therapy: Secondary | ICD-10-CM | POA: Diagnosis not present

## 2018-04-17 DIAGNOSIS — S6991XA Unspecified injury of right wrist, hand and finger(s), initial encounter: Secondary | ICD-10-CM | POA: Diagnosis not present

## 2018-04-17 DIAGNOSIS — L821 Other seborrheic keratosis: Secondary | ICD-10-CM | POA: Diagnosis not present

## 2018-04-17 DIAGNOSIS — M25531 Pain in right wrist: Secondary | ICD-10-CM | POA: Diagnosis not present

## 2018-04-17 DIAGNOSIS — L814 Other melanin hyperpigmentation: Secondary | ICD-10-CM | POA: Diagnosis not present

## 2018-04-17 DIAGNOSIS — W010XXA Fall on same level from slipping, tripping and stumbling without subsequent striking against object, initial encounter: Secondary | ICD-10-CM | POA: Diagnosis not present

## 2018-04-17 DIAGNOSIS — L570 Actinic keratosis: Secondary | ICD-10-CM | POA: Diagnosis not present

## 2018-04-17 DIAGNOSIS — X32XXXA Exposure to sunlight, initial encounter: Secondary | ICD-10-CM | POA: Diagnosis not present

## 2018-04-17 DIAGNOSIS — Z85828 Personal history of other malignant neoplasm of skin: Secondary | ICD-10-CM | POA: Diagnosis not present

## 2018-04-17 DIAGNOSIS — M79641 Pain in right hand: Secondary | ICD-10-CM | POA: Diagnosis not present

## 2018-04-17 DIAGNOSIS — Z08 Encounter for follow-up examination after completed treatment for malignant neoplasm: Secondary | ICD-10-CM | POA: Diagnosis not present

## 2018-04-23 DIAGNOSIS — Z79899 Other long term (current) drug therapy: Secondary | ICD-10-CM | POA: Diagnosis not present

## 2018-04-23 DIAGNOSIS — M0609 Rheumatoid arthritis without rheumatoid factor, multiple sites: Secondary | ICD-10-CM | POA: Diagnosis not present

## 2018-04-23 DIAGNOSIS — M79641 Pain in right hand: Secondary | ICD-10-CM | POA: Diagnosis not present

## 2018-04-23 DIAGNOSIS — G8929 Other chronic pain: Secondary | ICD-10-CM | POA: Diagnosis not present

## 2018-04-23 DIAGNOSIS — M25571 Pain in right ankle and joints of right foot: Secondary | ICD-10-CM | POA: Diagnosis not present

## 2018-05-29 ENCOUNTER — Other Ambulatory Visit: Payer: Self-pay | Admitting: Family Medicine

## 2018-05-29 DIAGNOSIS — Z1231 Encounter for screening mammogram for malignant neoplasm of breast: Secondary | ICD-10-CM

## 2018-06-09 DIAGNOSIS — R0781 Pleurodynia: Secondary | ICD-10-CM | POA: Diagnosis not present

## 2018-06-09 DIAGNOSIS — W010XXA Fall on same level from slipping, tripping and stumbling without subsequent striking against object, initial encounter: Secondary | ICD-10-CM | POA: Diagnosis not present

## 2018-06-09 DIAGNOSIS — S299XXA Unspecified injury of thorax, initial encounter: Secondary | ICD-10-CM | POA: Diagnosis not present

## 2018-06-09 DIAGNOSIS — S0012XA Contusion of left eyelid and periocular area, initial encounter: Secondary | ICD-10-CM | POA: Diagnosis not present

## 2018-06-09 DIAGNOSIS — R300 Dysuria: Secondary | ICD-10-CM | POA: Diagnosis not present

## 2018-06-16 DIAGNOSIS — M79642 Pain in left hand: Secondary | ICD-10-CM | POA: Diagnosis not present

## 2018-06-16 DIAGNOSIS — M0609 Rheumatoid arthritis without rheumatoid factor, multiple sites: Secondary | ICD-10-CM | POA: Diagnosis not present

## 2018-06-16 DIAGNOSIS — M810 Age-related osteoporosis without current pathological fracture: Secondary | ICD-10-CM | POA: Diagnosis not present

## 2018-06-16 DIAGNOSIS — R3129 Other microscopic hematuria: Secondary | ICD-10-CM | POA: Diagnosis not present

## 2018-06-18 ENCOUNTER — Ambulatory Visit
Admission: RE | Admit: 2018-06-18 | Discharge: 2018-06-18 | Disposition: A | Discharge status: Home / Self Care | Payer: Medicare Other | Source: Ambulatory Visit | Attending: Family Medicine | Admitting: Family Medicine

## 2018-06-18 DIAGNOSIS — Z1231 Encounter for screening mammogram for malignant neoplasm of breast: Secondary | ICD-10-CM | POA: Diagnosis not present

## 2018-06-18 DIAGNOSIS — H903 Sensorineural hearing loss, bilateral: Secondary | ICD-10-CM | POA: Diagnosis not present

## 2018-06-18 DIAGNOSIS — H6122 Impacted cerumen, left ear: Secondary | ICD-10-CM | POA: Diagnosis not present

## 2018-06-22 ENCOUNTER — Other Ambulatory Visit: Payer: Self-pay | Admitting: Family Medicine

## 2018-06-22 DIAGNOSIS — N6489 Other specified disorders of breast: Secondary | ICD-10-CM

## 2018-06-22 DIAGNOSIS — R3129 Other microscopic hematuria: Secondary | ICD-10-CM | POA: Diagnosis not present

## 2018-06-22 DIAGNOSIS — R928 Other abnormal and inconclusive findings on diagnostic imaging of breast: Secondary | ICD-10-CM

## 2018-06-25 ENCOUNTER — Encounter: Payer: Self-pay | Admitting: Occupational Therapy

## 2018-06-25 ENCOUNTER — Other Ambulatory Visit: Payer: Self-pay

## 2018-06-25 ENCOUNTER — Ambulatory Visit: Payer: Medicare Other | Attending: Rheumatology | Admitting: Occupational Therapy

## 2018-06-25 DIAGNOSIS — M79642 Pain in left hand: Secondary | ICD-10-CM | POA: Diagnosis not present

## 2018-06-25 DIAGNOSIS — M25642 Stiffness of left hand, not elsewhere classified: Secondary | ICD-10-CM | POA: Diagnosis not present

## 2018-06-25 DIAGNOSIS — M79641 Pain in right hand: Secondary | ICD-10-CM | POA: Insufficient documentation

## 2018-06-25 DIAGNOSIS — M6281 Muscle weakness (generalized): Secondary | ICD-10-CM | POA: Diagnosis not present

## 2018-06-25 NOTE — Patient Instructions (Signed)
Contrast  Block AROM tendon glides Opposition AROM   Joint protection and AE hand out review and provided

## 2018-06-25 NOTE — Therapy (Signed)
[place] [facility] [address], [place], [address] Phone: [phone]   Fax:  [phone]  Occupational Therapy Evaluation  Patient Details  Name: Teresa Lin MRN: [id number] Date of Birth: October 11, 1931 Referring Provider (OT): Jefm Bryant   Encounter Date: 06/25/2018  OT End of Session - 06/25/18 1855    Visit Number  1    Number of Visits  3    Date for OT Re-Evaluation  07/23/18    OT Start Time  1015    OT Stop Time  1111    OT Time Calculation (min)  56 min    Activity Tolerance  Patient tolerated treatment well    Behavior During Therapy  [facility] for tasks assessed/performed       Past Medical History:  Diagnosis Date  . Anemia   . Anxiety   . Arthritis    osteoarthritis. Bilateral knee replacement, hands, meniscal tear, cervical disc disease  . Atherosclerosis   . Atypical chest pain   . Collagen vascular disease ([place])   . Colon polyp 12/15/2017  . Colon polyps   . Compression fracture of L2 ([place])   . Diverticulosis   . DVT (deep venous thrombosis) ([place])   . GERD (gastroesophageal reflux disease)   . History of seronegative inflammatory arthritis   . Hypertension   . IBS (irritable bowel syndrome)   . Internal hemorrhoids   . Phlebitis and thrombophlebitis of deep veins of lower extremities (HCC)    after surgery  . Vision abnormalities     Past Surgical History:  Procedure Laterality Date  . APPENDECTOMY    . COLONOSCOPY WITH PROPOFOL N/A 10/24/2017   Procedure: COLONOSCOPY WITH PROPOFOL;  Surgeon: Manya Silvas, MD;  Location: [facility] ENDOSCOPY;  Service: Endoscopy;  Laterality: N/A;  . COLONOSCOPY WITH PROPOFOL N/A 11/21/2017   Procedure: COLONOSCOPY WITH PROPOFOL;  Surgeon: Manya Silvas, MD;  Location: [facility] ENDOSCOPY;  Service: Endoscopy;  Laterality: N/A;  . DILATION AND CURETTAGE OF UTERUS    . ESOPHAGOGASTRODUODENOSCOPY (EGD) WITH PROPOFOL N/A 02/25/2018   Procedure:  ESOPHAGOGASTRODUODENOSCOPY (EGD) WITH PROPOFOL;  Surgeon: Manya Silvas, MD;  Location: [facility] ENDOSCOPY;  Service: Endoscopy;  Laterality: N/A;  . EYE SURGERY     bilateral catarACT  . JOINT REPLACEMENT Bilateral    knee replacement  . LAPAROSCOPIC RIGHT COLECTOMY Right 12/15/2017   Procedure: LAPAROSCOPIC RIGHT COLECTOMY;  Surgeon: Robert Bellow, MD;  Location: [facility] ORS;  Service: General;  Laterality: Right;  . REPLACEMENT TOTAL KNEE BILATERAL    . TONSILLECTOMY      There were no vitals filed for this visit.  Subjective Assessment - 06/25/18 1848    Subjective   I seen you about year ago -and I know I stop doing what you told me to do - I feell about 3 wks ago but then just after that my L hand flared up - with increase edema and  pain     Patient Stated Goals  Do want to prevent my hands getting worse - and the spasms better     Currently in Pain?  Yes    Pain Score  5     Pain Location  Hand    Pain Orientation  Right;Left    Pain Descriptors / Indicators  Aching;Tightness    Pain Type  Chronic pain    Pain Onset  1 to 4 weeks ago        [facility] OT Assessment - 06/25/18 0001  Assessment   Medical Diagnosis  bilateral hand pain , imflammatory arthritis     Referring Provider (OT)  Jefm Bryant    Onset Date/Surgical Date  06/11/18    Hand Dominance  Right      Balance Screen   Has the patient fallen in the past 6 months  Yes    How many times?  2    Has the patient had a decrease in activity level because of a fear of falling?   No    Is the patient reluctant to leave their home because of a fear of falling?   No      Home  Environment   Lives With  Alone      Prior Function   Vocation  Retired    Leisure  Retired Pharmacist, hospital , likes to work in yard, travel , read books, card games , do own house work       Paraffin done to bilateral hands prior to review of HEP -pain decrease   recommend for pt to get one at home  But ed on if flare up with there arthritis - pt  to do contrast   And then her AROM  HEP :   Contrast  Block AROM tendon glides Opposition AROM   Joint protection and AE hand out review and provided                 OT Education - 06/25/18 1854    Education provided  Yes    Education Details  findings OF eval and compare to year ago     Person(s) Educated  Patient    Methods  Explanation;Demonstration;Handout    Comprehension  Verbalized understanding;Returned demonstration          OT Long Term Goals - 06/25/18 1858      OT LONG TERM GOAL #1   Title  Pt verbalize 3 joint protection and AE she is using to decrease pain in hands     Time  4    Period  Weeks    Status  New    Target Date  07/23/18      OT LONG TERM GOAL #2   Title  Pt show independent in AROM to increase AROM in L hand and increase grip /prehension in bilateral hands  with pain less than 5/10     Baseline  strength decrease and L AROM in 3rd and 4th digits decrease since year ago     Time  4    Period  Weeks    Status  New    Target Date  07/23/18            Plan - 06/25/18 1856    Clinical Impression Statement  Pt refer for bilateral hand pain with diagnosis of imflammatory arthritis - Dr Jefm Bryant and changing her medication - pt showed about 2 wks ago flareup in her L hand and since then had increase pain , decrease AROM and grip/prehension strength in bilateral hands compare to year ago - pt can benefit from OT services to get pt on HEP to decrease pain and edema , increase AROM L hand and increase strength     Occupational performance deficits (Please refer to evaluation for details):  ADL's;IADL's;Play;Leisure    Rehab Potential  Fair    Current Impairments/barriers affecting progress:  chronic condition     OT Frequency  Biweekly    OT Duration  4 weeks    OT Treatment/Interventions  Therapeutic exercise;Self-care/ADL  training;DME and/or AE instruction;Manual Therapy;Paraffin;Patient/family education    Plan  assess progress  with homeprogram     Clinical Decision Making  Limited treatment options, no task modification necessary    OT Home Exercise Plan  see pt instruction        Patient will benefit from skilled therapeutic intervention in order to improve the following deficits and impairments:  Pain, Impaired UE functional use, Decreased knowledge of use of DME, Impaired flexibility  Visit Diagnosis: Pain in left hand - Plan: Ot plan of care cert/re-cert  Pain in right hand - Plan: Ot plan of care cert/re-cert  Stiffness of left hand, not elsewhere classified - Plan: Ot plan of care cert/re-cert  Muscle weakness (generalized) - Plan: Ot plan of care cert/re-cert    Problem List Patient Active Problem List   Diagnosis Date Noted  . S/P right colectomy 01/26/2018  . Adenomatous polyp of ascending colon 11/24/2017  . Generalized anxiety disorder 09/06/2017  . Irritable bowel syndrome (IBS) 09/06/2017  . Fracture of one rib, right side, subsequent encounter for fracture with routine healing 03/08/2017  . Orthostasis 06/05/2016  . Left shoulder pain 10/13/2015  . Constipation 05/30/2015  . Osteoporosis, postmenopausal 05/30/2015  . Compression fracture of L2 lumbar vertebra with delayed healing 05/29/2015  . Frequent falls 03/26/2015  . Atherosclerosis of arteries 03/26/2015  . Chest pain 02/09/2015  . Breast cancer screening 10/01/2014  . Family history of colon cancer 09/28/2014  . Viral URI with cough 06/12/2014  . Tenesmus (rectal) 06/12/2014  . TMJ (temporomandibular joint syndrome) 06/12/2014  . Varicose veins of both lower extremities without ulcer or inflammation 06/12/2014  . B12 deficiency 09/20/2013  . Fatigue 05/23/2013  . Internal hemorrhoids without complication 25/42/7062  . Insomnia 03/03/2013  . Vertigo due to cerebrovascular disease 03/03/2013  . Seronegative rheumatoid arthritis affecting lower leg (HCC) 03/03/2013    Rosalyn Gess OTR/L,CLT 06/25/2018, 7:02 PM  [facility] [address], [place], [address] Phone: [phone]   Fax:  [phone]  Name: TRINH SANJOSE MRN: [id number] Date of Birth: 1932/01/08

## 2018-06-26 ENCOUNTER — Other Ambulatory Visit: Payer: Self-pay | Admitting: Family Medicine

## 2018-06-26 ENCOUNTER — Other Ambulatory Visit (HOSPITAL_COMMUNITY): Payer: Self-pay | Admitting: Family Medicine

## 2018-06-26 DIAGNOSIS — R3129 Other microscopic hematuria: Secondary | ICD-10-CM

## 2018-06-29 ENCOUNTER — Other Ambulatory Visit: Payer: Self-pay | Admitting: Family Medicine

## 2018-06-29 DIAGNOSIS — R3129 Other microscopic hematuria: Secondary | ICD-10-CM

## 2018-06-30 ENCOUNTER — Other Ambulatory Visit: Payer: Self-pay | Admitting: Family Medicine

## 2018-06-30 DIAGNOSIS — R3129 Other microscopic hematuria: Secondary | ICD-10-CM | POA: Diagnosis not present

## 2018-07-01 ENCOUNTER — Other Ambulatory Visit: Payer: Self-pay | Admitting: Family Medicine

## 2018-07-01 ENCOUNTER — Ambulatory Visit
Admission: RE | Admit: 2018-07-01 | Discharge: 2018-07-01 | Disposition: A | Discharge status: Home / Self Care | Payer: Medicare Other | Source: Ambulatory Visit | Attending: Family Medicine | Admitting: Family Medicine

## 2018-07-01 DIAGNOSIS — R928 Other abnormal and inconclusive findings on diagnostic imaging of breast: Secondary | ICD-10-CM

## 2018-07-01 DIAGNOSIS — N6489 Other specified disorders of breast: Secondary | ICD-10-CM | POA: Diagnosis not present

## 2018-07-01 DIAGNOSIS — R922 Inconclusive mammogram: Secondary | ICD-10-CM | POA: Diagnosis not present

## 2018-07-03 ENCOUNTER — Ambulatory Visit
Admission: RE | Admit: 2018-07-03 | Discharge: 2018-07-03 | Disposition: A | Discharge status: Home / Self Care | Payer: Medicare Other | Source: Ambulatory Visit | Attending: Family Medicine | Admitting: Family Medicine

## 2018-07-03 DIAGNOSIS — R3129 Other microscopic hematuria: Secondary | ICD-10-CM

## 2018-07-03 MED ORDER — IOPAMIDOL (ISOVUE-300) INJECTION 61%
125.0000 mL | Freq: Once | INTRAVENOUS | Status: AC | PRN
  Administered 2018-07-03: 125 mL via INTRAVENOUS

## 2018-07-09 DIAGNOSIS — M05741 Rheumatoid arthritis with rheumatoid factor of right hand without organ or systems involvement: Secondary | ICD-10-CM | POA: Diagnosis not present

## 2018-07-09 DIAGNOSIS — Z79899 Other long term (current) drug therapy: Secondary | ICD-10-CM | POA: Diagnosis not present

## 2018-07-09 DIAGNOSIS — M3501 Sicca syndrome with keratoconjunctivitis: Secondary | ICD-10-CM | POA: Diagnosis not present

## 2018-07-16 ENCOUNTER — Ambulatory Visit: Payer: Medicare Other | Attending: Rheumatology | Admitting: Occupational Therapy

## 2018-07-16 DIAGNOSIS — M25642 Stiffness of left hand, not elsewhere classified: Secondary | ICD-10-CM | POA: Diagnosis not present

## 2018-07-16 DIAGNOSIS — M79642 Pain in left hand: Secondary | ICD-10-CM

## 2018-07-16 DIAGNOSIS — M6281 Muscle weakness (generalized): Secondary | ICD-10-CM | POA: Diagnosis not present

## 2018-07-16 DIAGNOSIS — M79641 Pain in right hand: Secondary | ICD-10-CM | POA: Diagnosis not present

## 2018-07-16 NOTE — Patient Instructions (Signed)
Same HEP - moist heat   2 x day  AROM tendon glides pain free  And Opposition to all digits  Fitted isotoner glove to use at night time  And cont Joint protection principles

## 2018-07-16 NOTE — Therapy (Signed)
[facility] [address], [place], [address] Phone: [phone]   Fax:  [phone]  Occupational Therapy Treatment  Patient Details  Name: Teresa Lin MRN: [id number] Date of Birth: 08/04/1931 Referring Provider (OT): Jefm Bryant   Encounter Date: 07/16/2018  OT End of Session - 07/16/18 0827    Visit Number  2    Number of Visits  3    Date for OT Re-Evaluation  07/23/18    OT Start Time  0805    OT Stop Time  0848    OT Time Calculation (min)  43 min    Activity Tolerance  Patient tolerated treatment well    Behavior During Therapy  [facility] for tasks assessed/performed       Past Medical History:  Diagnosis Date  . Anemia   . Anxiety   . Arthritis    osteoarthritis. Bilateral knee replacement, hands, meniscal tear, cervical disc disease  . Atherosclerosis   . Atypical chest pain   . Collagen vascular disease ([place])   . Colon polyp 12/15/2017  . Colon polyps   . Compression fracture of L2 ([place])   . Diverticulosis   . DVT (deep venous thrombosis) ([place])   . GERD (gastroesophageal reflux disease)   . History of seronegative inflammatory arthritis   . Hypertension   . IBS (irritable bowel syndrome)   . Internal hemorrhoids   . Phlebitis and thrombophlebitis of deep veins of lower extremities (HCC)    after surgery  . Vision abnormalities     Past Surgical History:  Procedure Laterality Date  . APPENDECTOMY    . COLONOSCOPY WITH PROPOFOL N/A 10/24/2017   Procedure: COLONOSCOPY WITH PROPOFOL;  Surgeon: Manya Silvas, MD;  Location: [facility] ENDOSCOPY;  Service: Endoscopy;  Laterality: N/A;  . COLONOSCOPY WITH PROPOFOL N/A 11/21/2017   Procedure: COLONOSCOPY WITH PROPOFOL;  Surgeon: Manya Silvas, MD;  Location: [facility] ENDOSCOPY;  Service: Endoscopy;  Laterality: N/A;  . DILATION AND CURETTAGE OF UTERUS    . ESOPHAGOGASTRODUODENOSCOPY (EGD) WITH PROPOFOL N/A 02/25/2018   Procedure:  ESOPHAGOGASTRODUODENOSCOPY (EGD) WITH PROPOFOL;  Surgeon: Manya Silvas, MD;  Location: [facility] ENDOSCOPY;  Service: Endoscopy;  Laterality: N/A;  . EYE SURGERY     bilateral catarACT  . JOINT REPLACEMENT Bilateral    knee replacement  . LAPAROSCOPIC RIGHT COLECTOMY Right 12/15/2017   Procedure: LAPAROSCOPIC RIGHT COLECTOMY;  Surgeon: Robert Bellow, MD;  Location: [facility] ORS;  Service: General;  Laterality: Right;  . REPLACEMENT TOTAL KNEE BILATERAL    . TONSILLECTOMY      There were no vitals filed for this visit.  Subjective Assessment - 07/16/18 0824    Subjective   My hands still hurt when I make fist - the knuckle in middle finger on R the worse and the large knuckle on the L -     Patient Stated Goals  Do want to prevent my hands getting worse - and the spasms better     Currently in Pain?  Yes    Pain Score  5     Pain Location  Hand    Pain Orientation  Right;Left    Pain Descriptors / Indicators  Aching;Tightness    Pain Onset  1 to 4 weeks ago         [facility] OT Assessment - 07/16/18 0001      Strength   Right Hand Grip (lbs)  40    Right Hand Lateral Pinch  14  lbs    Right Hand 3 Point Pinch  10 lbs    Left Hand Grip (lbs)  40    Left Hand Lateral Pinch  12 lbs    Left Hand 3 Point Pinch  10 lbs      Right Hand AROM   R Index  MCP 0-90  70 Degrees    R Index PIP 0-100  90 Degrees    R Long PIP 0-100  50 Degrees      Left Hand AROM   L Index  MCP 0-90  75 Degrees      assess AROM and grip /prehension - see flowsheet- maintain compare to last year  AROM decrease in R 2nd MC and 3rd PIP - but after paraffin and soft tissue mobs gained AROM - same as last year  Pt arrive with complains of pain in R 3rd PIP and L 2nd MC         OT Treatments/Exercises (OP) - 07/16/18 0001      RUE Paraffin   Number Minutes Paraffin  10 Minutes    RUE Paraffin Location  Hand    Comments  prior to soft tissue mobs       LUE Paraffin   Number Minutes Paraffin  10  Minutes    LUE Paraffin Location  Hand    Comments  Prior to soft tissue  massage      soft tissue mobs done - CT and MC spreads done and graston tool nr 2 for sweeping over volar forearm and palm - to 3rd digit R  Prior to review of HEP -  Tendon glides AROM - pain free and opposition   Fitted with bilateral isotoner glove to use at night time  Review again joint protection - with avoid tight and sustained grip and use larger joints  pt to look into using paraffin bath at home  Follow up in 2-3 wks again to reassess pain and AROM      Same HEP - moist heat   2 x day  AROM tendon glides pain free  And Opposition to all digits  Fitted isotoner glove to use at night time  And cont Joint protection principles     OT Education - 07/16/18 0851    Education provided  Yes    Education Details  review HEP again     Person(s) Educated  Patient    Methods  Explanation;Demonstration;Handout    Comprehension  Verbalized understanding;Returned demonstration          OT Long Term Goals - 06/25/18 1858      OT LONG TERM GOAL #1   Title  Pt verbalize 3 joint protection and AE she is using to decrease pain in hands     Time  4    Period  Weeks    Status  New    Target Date  07/23/18      OT LONG TERM GOAL #2   Title  Pt show independent in AROM to increase AROM in L hand and increase grip /prehension in bilateral hands  with pain less than 5/10     Baseline  strength decrease and L AROM in 3rd and 4th digits decrease since year ago     Time  4    Period  Weeks    Status  New    Target Date  07/23/18            Plan - 07/16/18 9381    Clinical  Impression Statement  Pt was refer for bilateral hands pain - was seen for eval 3 wks ago - pt return this date with cont pain over L 2nd and 3rd MC's and R 3rd PIP - pt grip and prehension strength same as year ago - but did loose some AROM in R 2nd MC and 3rd PIP - with increase pain - pt ed on HEP to use moist heat to decrease pain   and marntain her ROM and strengt - review again joint protection principles     Occupational performance deficits (Please refer to evaluation for details):  ADL's;IADL's;Play;Leisure    Rehab Potential  Fair    Clinical Decision Making  Limited treatment options, no task modification necessary    OT Frequency  Biweekly    OT Duration  4 weeks    OT Treatment/Interventions  Therapeutic exercise;Self-care/ADL training;DME and/or AE instruction;Manual Therapy;Paraffin;Patient/family education    Plan  assess progress with homeprogram and modify as needed     OT Home Exercise Plan  see pt instruction     Consulted and Agree with Plan of Care  Patient       Patient will benefit from skilled therapeutic intervention in order to improve the following deficits and impairments:     Visit Diagnosis: Pain in left hand  Pain in right hand  Stiffness of left hand, not elsewhere classified  Muscle weakness (generalized)    Problem List Patient Active Problem List   Diagnosis Date Noted  . S/P right colectomy 01/26/2018  . Adenomatous polyp of ascending colon 11/24/2017  . Generalized anxiety disorder 09/06/2017  . Irritable bowel syndrome (IBS) 09/06/2017  . Fracture of one rib, right side, subsequent encounter for fracture with routine healing 03/08/2017  . Orthostasis 06/05/2016  . Left shoulder pain 10/13/2015  . Constipation 05/30/2015  . Osteoporosis, postmenopausal 05/30/2015  . Compression fracture of L2 lumbar vertebra with delayed healing 05/29/2015  . Frequent falls 03/26/2015  . Atherosclerosis of arteries 03/26/2015  . Chest pain 02/09/2015  . Breast cancer screening 10/01/2014  . Family history of colon cancer 09/28/2014  . Viral URI with cough 06/12/2014  . Tenesmus (rectal) 06/12/2014  . TMJ (temporomandibular joint syndrome) 06/12/2014  . Varicose veins of both lower extremities without ulcer or inflammation 06/12/2014  . B12 deficiency 09/20/2013  . Fatigue  05/23/2013  . Internal hemorrhoids without complication 09/32/3557  . Insomnia 03/03/2013  . Vertigo due to cerebrovascular disease 03/03/2013  . Seronegative rheumatoid arthritis affecting lower leg (HCC) 03/03/2013    Rosalyn Gess OTR/l,CLT 07/16/2018, 9:29 AM  [place] [facility] [address], [place], [address] Phone: [phone]   Fax:  [phone]  Name: Teresa Lin MRN: [id number] Date of Birth: 10-09-1931

## 2018-07-23 DIAGNOSIS — I8390 Asymptomatic varicose veins of unspecified lower extremity: Secondary | ICD-10-CM | POA: Diagnosis not present

## 2018-07-23 DIAGNOSIS — I708 Atherosclerosis of other arteries: Secondary | ICD-10-CM | POA: Diagnosis not present

## 2018-07-27 ENCOUNTER — Encounter: Payer: Self-pay | Admitting: Internal Medicine

## 2018-07-29 DIAGNOSIS — M47816 Spondylosis without myelopathy or radiculopathy, lumbar region: Secondary | ICD-10-CM | POA: Diagnosis not present

## 2018-07-29 DIAGNOSIS — S32020A Wedge compression fracture of second lumbar vertebra, initial encounter for closed fracture: Secondary | ICD-10-CM | POA: Diagnosis not present

## 2018-08-18 ENCOUNTER — Ambulatory Visit: Payer: Self-pay | Admitting: Urology

## 2018-08-31 DIAGNOSIS — R739 Hyperglycemia, unspecified: Secondary | ICD-10-CM | POA: Insufficient documentation

## 2018-08-31 DIAGNOSIS — R7309 Other abnormal glucose: Secondary | ICD-10-CM | POA: Diagnosis not present

## 2018-08-31 DIAGNOSIS — M0609 Rheumatoid arthritis without rheumatoid factor, multiple sites: Secondary | ICD-10-CM | POA: Diagnosis not present

## 2018-09-07 ENCOUNTER — Ambulatory Visit: Payer: Medicare Other

## 2018-09-07 ENCOUNTER — Ambulatory Visit: Payer: Medicare Other | Admitting: Internal Medicine

## 2018-09-14 DIAGNOSIS — M0609 Rheumatoid arthritis without rheumatoid factor, multiple sites: Secondary | ICD-10-CM | POA: Diagnosis not present

## 2018-09-14 DIAGNOSIS — K121 Other forms of stomatitis: Secondary | ICD-10-CM | POA: Diagnosis not present

## 2018-09-14 DIAGNOSIS — L03116 Cellulitis of left lower limb: Secondary | ICD-10-CM | POA: Diagnosis not present

## 2018-09-17 DIAGNOSIS — S42211A Unspecified displaced fracture of surgical neck of right humerus, initial encounter for closed fracture: Secondary | ICD-10-CM | POA: Diagnosis not present

## 2018-09-18 ENCOUNTER — Other Ambulatory Visit: Payer: Self-pay | Admitting: Orthopedic Surgery

## 2018-09-18 ENCOUNTER — Other Ambulatory Visit: Payer: Self-pay

## 2018-09-18 ENCOUNTER — Ambulatory Visit
Admission: RE | Admit: 2018-09-18 | Discharge: 2018-09-18 | Disposition: A | Discharge status: Home / Self Care | Payer: Medicare Other | Source: Ambulatory Visit | Attending: Orthopedic Surgery | Admitting: Orthopedic Surgery

## 2018-09-18 ENCOUNTER — Other Ambulatory Visit (HOSPITAL_COMMUNITY): Payer: Self-pay | Admitting: Orthopedic Surgery

## 2018-09-18 DIAGNOSIS — S42211A Unspecified displaced fracture of surgical neck of right humerus, initial encounter for closed fracture: Secondary | ICD-10-CM | POA: Insufficient documentation

## 2018-09-21 ENCOUNTER — Other Ambulatory Visit: Payer: Self-pay

## 2018-09-21 ENCOUNTER — Other Ambulatory Visit: Payer: Self-pay | Admitting: Surgery

## 2018-09-21 ENCOUNTER — Other Ambulatory Visit
Admission: RE | Admit: 2018-09-21 | Discharge: 2018-09-21 | Disposition: A | Discharge status: Home / Self Care | Payer: Medicare Other | Source: Ambulatory Visit | Attending: Surgery | Admitting: Surgery

## 2018-09-21 DIAGNOSIS — S42291A Other displaced fracture of upper end of right humerus, initial encounter for closed fracture: Secondary | ICD-10-CM | POA: Diagnosis not present

## 2018-09-21 DIAGNOSIS — W010XXA Fall on same level from slipping, tripping and stumbling without subsequent striking against object, initial encounter: Secondary | ICD-10-CM | POA: Diagnosis not present

## 2018-09-21 DIAGNOSIS — Z1159 Encounter for screening for other viral diseases: Secondary | ICD-10-CM | POA: Insufficient documentation

## 2018-09-22 ENCOUNTER — Encounter
Admission: RE | Admit: 2018-09-22 | Discharge: 2018-09-22 | Disposition: A | Discharge status: Home / Self Care | Payer: Medicare Other | Source: Ambulatory Visit | Attending: Surgery | Admitting: Surgery

## 2018-09-22 ENCOUNTER — Other Ambulatory Visit: Payer: Self-pay

## 2018-09-22 DIAGNOSIS — Z01818 Encounter for other preprocedural examination: Secondary | ICD-10-CM | POA: Insufficient documentation

## 2018-09-22 DIAGNOSIS — I10 Essential (primary) hypertension: Secondary | ICD-10-CM | POA: Diagnosis not present

## 2018-09-22 LAB — URINALYSIS, ROUTINE W REFLEX MICROSCOPIC
Bilirubin Urine: NEGATIVE
Glucose, UA: NEGATIVE mg/dL
Ketones, ur: NEGATIVE mg/dL
Nitrite: NEGATIVE
Protein, ur: NEGATIVE mg/dL
Specific Gravity, Urine: 1.008 (ref 1.005–1.030)
pH: 6 (ref 5.0–8.0)

## 2018-09-22 LAB — CBC
HCT: 37.6 % (ref 36.0–46.0)
Hemoglobin: 12.1 g/dL (ref 12.0–15.0)
MCH: 31.8 pg (ref 26.0–34.0)
MCHC: 32.2 g/dL (ref 30.0–36.0)
MCV: 98.9 fL (ref 80.0–100.0)
Platelets: 295 10*3/uL (ref 150–400)
RBC: 3.8 MIL/uL — ABNORMAL LOW (ref 3.87–5.11)
RDW: 13.2 % (ref 11.5–15.5)
WBC: 8.4 10*3/uL (ref 4.0–10.5)
nRBC: 0 % (ref 0.0–0.2)

## 2018-09-22 LAB — BASIC METABOLIC PANEL
Anion gap: 8 (ref 5–15)
BUN: 17 mg/dL (ref 8–23)
CO2: 28 mmol/L (ref 22–32)
Calcium: 8.8 mg/dL — ABNORMAL LOW (ref 8.9–10.3)
Chloride: 102 mmol/L (ref 98–111)
Creatinine, Ser: 0.42 mg/dL — ABNORMAL LOW (ref 0.44–1.00)
GFR calc Af Amer: >60 mL/min (ref >60)
GFR calc non Af Amer: >60 mL/min (ref >60)
Glucose, Bld: 93 mg/dL (ref 70–99)
Potassium: 4.6 mmol/L (ref 3.5–5.1)
Sodium: 138 mmol/L (ref 135–145)

## 2018-09-22 LAB — SURGICAL PCR SCREEN
MRSA, PCR: NEGATIVE
Staphylococcus aureus: NEGATIVE

## 2018-09-22 LAB — NOVEL CORONAVIRUS, NAA (HOSP ORDER, SEND-OUT TO REF LAB; TAT 18-24 HRS): SARS-CoV-2, NAA: NOT DETECTED

## 2018-09-22 NOTE — Patient Instructions (Signed)
Your procedure is scheduled FX:TKWIO. 5/14 Report to Day Surgery. To find out your arrival time please call [phone] between [facility] on Wed. 5/13.  Remember: Instructions that are not followed completely may result in serious medical risk,  up to and including death, or upon the discretion of your surgeon and anesthesiologist your  surgery may need to be rescheduled.     _X__ 1. Do not eat food after midnight the night before your procedure.                 No gum chewing or hard candies. You may drink clear liquids up to 2 hours                 before you are scheduled to arrive for your surgery- DO not drink clear                 liquids within 2 hours of the start of your surgery.                 Clear Liquids include:  water, apple juice without pulp, clear carbohydrate                 drink such as Clearfast of Gatorade, Black Coffee or Tea (Do not add                 anything to coffee or tea).  __X__2.  On the morning of surgery brush your teeth with toothpaste and water, you                may rinse your mouth with mouthwash if you wish.  Do not swallow any toothpaste of mouthwash.     _X__ 3.  No Alcohol for 24 hours before or after surgery.   ___ 4.  Do Not Smoke or use e-cigarettes For 24 Hours Prior to Your Surgery.                 Do not use any chewable tobacco products for at least 6 hours prior to                 surgery.  ____  5.  Bring all medications with you on the day of surgery if instructed.   __x__  6.  Notify your doctor if there is any change in your medical condition      (cold, fever, infections).     Do not wear jewelry, make-up, hairpins, clips or nail polish. Do not wear lotions, powders, or perfumes. You may wear deodorant. Do not shave 48 hours prior to surgery. Men may shave face and neck. Do not bring valuables to the hospital.    [facility] is not responsible for any belongings or valuables.  Contacts, dentures  or bridgework may not be worn into surgery. Leave your suitcase in the car. After surgery it may be brought to your room. For patients admitted to the hospital, discharge time is determined by your treatment team.   Patients discharged the day of surgery will not be allowed to drive home.   Please read over the following fact sheets that you were given:    x___ Take these medicines the morning of surgery with A SIP OF WATER:    1. cyclobenzaprine (FLEXERIL) 10 MG tablet if needed  2. HYDROcodone-acetaminophen (NORCO/VICODIN) 5-325 MG tablet if needed  3.   4.  5.  6.  ____ Fleet Enema (as directed)   __x__ Use CHG  Soap as directed  ____ Use inhalers on the day of surgery  ____ Stop metformin 2 days prior to surgery    ____ Take 1/2 of usual insulin dose the night before surgery. No insulin the morning          of surgery.   ____ Stop Coumadin/Plavix/aspirin on   _x___ Stop Anti-inflammatories  meloxicam (MOBIC) 15 MG tablet today May take tylenol   ____ Stop supplements until after surgery.    ____ Bring C-Pap to the hospital.

## 2018-09-23 LAB — URINE CULTURE: Culture: NO GROWTH

## 2018-09-23 LAB — TYPE AND SCREEN
ABO/RH(D): O POS
Antibody Screen: NEGATIVE

## 2018-09-23 MED ORDER — CEFAZOLIN SODIUM-DEXTROSE 2-4 GM/100ML-% IV SOLN
2.0000 g | Freq: Once | INTRAVENOUS | Status: AC
  Administered 2018-09-24: 2 g via INTRAVENOUS

## 2018-09-24 ENCOUNTER — Inpatient Hospital Stay
Admission: RE | Admit: 2018-09-24 | Discharge: 2018-09-25 | DRG: 483 | Disposition: A | Discharge status: Home / Self Care | Payer: Medicare Other | Attending: Surgery | Admitting: Surgery

## 2018-09-24 ENCOUNTER — Encounter
Admission: RE | Disposition: A | Discharge status: Home / Self Care | Payer: Self-pay | Source: Home / Self Care | Attending: Surgery

## 2018-09-24 ENCOUNTER — Inpatient Hospital Stay: Payer: Medicare Other

## 2018-09-24 ENCOUNTER — Encounter: Payer: Self-pay | Admitting: Anesthesiology

## 2018-09-24 ENCOUNTER — Inpatient Hospital Stay: Payer: Medicare Other | Admitting: Anesthesiology

## 2018-09-24 ENCOUNTER — Other Ambulatory Visit: Payer: Self-pay

## 2018-09-24 DIAGNOSIS — G8918 Other acute postprocedural pain: Secondary | ICD-10-CM | POA: Diagnosis not present

## 2018-09-24 DIAGNOSIS — M25512 Pain in left shoulder: Secondary | ICD-10-CM | POA: Diagnosis not present

## 2018-09-24 DIAGNOSIS — Z888 Allergy status to other drugs, medicaments and biological substances status: Secondary | ICD-10-CM

## 2018-09-24 DIAGNOSIS — K219 Gastro-esophageal reflux disease without esophagitis: Secondary | ICD-10-CM | POA: Diagnosis present

## 2018-09-24 DIAGNOSIS — Z471 Aftercare following joint replacement surgery: Secondary | ICD-10-CM | POA: Diagnosis not present

## 2018-09-24 DIAGNOSIS — Z79899 Other long term (current) drug therapy: Secondary | ICD-10-CM

## 2018-09-24 DIAGNOSIS — Y92096 Garden or yard of other non-institutional residence as the place of occurrence of the external cause: Secondary | ICD-10-CM

## 2018-09-24 DIAGNOSIS — Z96611 Presence of right artificial shoulder joint: Secondary | ICD-10-CM

## 2018-09-24 DIAGNOSIS — I10 Essential (primary) hypertension: Secondary | ICD-10-CM | POA: Diagnosis not present

## 2018-09-24 DIAGNOSIS — Z8719 Personal history of other diseases of the digestive system: Secondary | ICD-10-CM | POA: Diagnosis not present

## 2018-09-24 DIAGNOSIS — W010XXA Fall on same level from slipping, tripping and stumbling without subsequent striking against object, initial encounter: Secondary | ICD-10-CM | POA: Diagnosis present

## 2018-09-24 DIAGNOSIS — E785 Hyperlipidemia, unspecified: Secondary | ICD-10-CM | POA: Diagnosis present

## 2018-09-24 DIAGNOSIS — E119 Type 2 diabetes mellitus without complications: Secondary | ICD-10-CM | POA: Diagnosis not present

## 2018-09-24 DIAGNOSIS — S42201A Unspecified fracture of upper end of right humerus, initial encounter for closed fracture: Secondary | ICD-10-CM | POA: Diagnosis not present

## 2018-09-24 DIAGNOSIS — S42231A 3-part fracture of surgical neck of right humerus, initial encounter for closed fracture: Secondary | ICD-10-CM | POA: Diagnosis present

## 2018-09-24 DIAGNOSIS — Z96653 Presence of artificial knee joint, bilateral: Secondary | ICD-10-CM | POA: Diagnosis present

## 2018-09-24 DIAGNOSIS — Z8249 Family history of ischemic heart disease and other diseases of the circulatory system: Secondary | ICD-10-CM | POA: Diagnosis not present

## 2018-09-24 DIAGNOSIS — S42291A Other displaced fracture of upper end of right humerus, initial encounter for closed fracture: Secondary | ICD-10-CM | POA: Diagnosis not present

## 2018-09-24 DIAGNOSIS — Z86718 Personal history of other venous thrombosis and embolism: Secondary | ICD-10-CM | POA: Diagnosis not present

## 2018-09-24 DIAGNOSIS — S42191A Fracture of other part of scapula, right shoulder, initial encounter for closed fracture: Secondary | ICD-10-CM | POA: Diagnosis not present

## 2018-09-24 HISTORY — PX: TOTAL SHOULDER ARTHROPLASTY: SHX126

## 2018-09-24 SURGERY — ARTHROPLASTY, SHOULDER, TOTAL
Anesthesia: General | Laterality: Right

## 2018-09-24 MED ORDER — EPHEDRINE SULFATE 50 MG/ML IJ SOLN
INTRAMUSCULAR | Status: AC
  Filled 2018-09-24: qty 1

## 2018-09-24 MED ORDER — NEOMYCIN-POLYMYXIN B GU 40-200000 IR SOLN
Status: AC
  Filled 2018-09-24: qty 20

## 2018-09-24 MED ORDER — SUCCINYLCHOLINE CHLORIDE 20 MG/ML IJ SOLN
INTRAMUSCULAR | Status: AC
  Filled 2018-09-24: qty 1

## 2018-09-24 MED ORDER — CEFAZOLIN SODIUM-DEXTROSE 2-4 GM/100ML-% IV SOLN
2.0000 g | Freq: Four times a day (QID) | INTRAVENOUS | Status: AC
  Administered 2018-09-24 (×2): 2 g via INTRAVENOUS
  Filled 2018-09-24 (×3): qty 100

## 2018-09-24 MED ORDER — FAMOTIDINE 20 MG PO TABS
ORAL_TABLET | ORAL | Status: AC
  Administered 2018-09-24: 20 mg via ORAL
  Filled 2018-09-24: qty 1

## 2018-09-24 MED ORDER — METOCLOPRAMIDE HCL 5 MG PO TABS: 5.0000 mg | ORAL_TABLET | Freq: Three times a day (TID) | ORAL | Status: DC | PRN

## 2018-09-24 MED ORDER — PROPOFOL 10 MG/ML IV BOLUS
INTRAVENOUS | Status: DC | PRN
  Administered 2018-09-24: 120 mg via INTRAVENOUS

## 2018-09-24 MED ORDER — POLYVINYL ALCOHOL 1.4 % OP SOLN
1.0000 [drp] | Freq: Three times a day (TID) | OPHTHALMIC | Status: DC | PRN
  Filled 2018-09-24: qty 15

## 2018-09-24 MED ORDER — MIDAZOLAM HCL 2 MG/2ML IJ SOLN
INTRAMUSCULAR | Status: AC
  Administered 2018-09-24: 1 mg via INTRAVENOUS
  Filled 2018-09-24: qty 2

## 2018-09-24 MED ORDER — PROPOFOL 10 MG/ML IV BOLUS
INTRAVENOUS | Status: AC
  Filled 2018-09-24: qty 20

## 2018-09-24 MED ORDER — FENTANYL CITRATE (PF) 100 MCG/2ML IJ SOLN
25.0000 ug | Freq: Once | INTRAMUSCULAR | Status: AC
  Administered 2018-09-24: 08:00:00 25 ug via INTRAVENOUS

## 2018-09-24 MED ORDER — ADULT MULTIVITAMIN W/MINERALS CH
1.0000 | ORAL_TABLET | Freq: Every day | ORAL | Status: DC
  Administered 2018-09-25: 1 via ORAL
  Filled 2018-09-24: qty 1

## 2018-09-24 MED ORDER — ONDANSETRON HCL 4 MG/2ML IJ SOLN: 4.0000 mg | Freq: Four times a day (QID) | INTRAMUSCULAR | Status: DC | PRN

## 2018-09-24 MED ORDER — EPHEDRINE SULFATE 50 MG/ML IJ SOLN
INTRAMUSCULAR | Status: DC | PRN
  Administered 2018-09-24: 10 mg via INTRAVENOUS
  Administered 2018-09-24: 5 mg via INTRAVENOUS
  Administered 2018-09-24: 15 mg via INTRAVENOUS
  Administered 2018-09-24: 20 mg via INTRAVENOUS

## 2018-09-24 MED ORDER — HYDROCODONE-ACETAMINOPHEN 5-325 MG PO TABS
1.0000 | ORAL_TABLET | ORAL | Status: DC | PRN
  Administered 2018-09-25: 03:00:00 1 via ORAL
  Filled 2018-09-24: qty 1

## 2018-09-24 MED ORDER — ACETAMINOPHEN 500 MG PO TABS
500.0000 mg | ORAL_TABLET | Freq: Four times a day (QID) | ORAL | Status: AC
  Administered 2018-09-24 (×2): 500 mg via ORAL
  Filled 2018-09-24 (×3): qty 1

## 2018-09-24 MED ORDER — TRANEXAMIC ACID 1000 MG/10ML IV SOLN
INTRAVENOUS | Status: DC | PRN
  Administered 2018-09-24: 1000 mg via TOPICAL

## 2018-09-24 MED ORDER — OXYCODONE HCL 5 MG PO TABS: 5.0000 mg | ORAL_TABLET | Freq: Once | ORAL | Status: DC | PRN

## 2018-09-24 MED ORDER — BUPIVACAINE LIPOSOME 1.3 % IJ SUSP
INTRAMUSCULAR | Status: DC | PRN
  Administered 2018-09-24: 20 mL via PERINEURAL

## 2018-09-24 MED ORDER — DOCUSATE SODIUM 100 MG PO CAPS
100.0000 mg | ORAL_CAPSULE | Freq: Two times a day (BID) | ORAL | Status: DC
  Administered 2018-09-24 – 2018-09-25 (×2): 100 mg via ORAL
  Filled 2018-09-24 (×2): qty 1

## 2018-09-24 MED ORDER — SUCCINYLCHOLINE CHLORIDE 20 MG/ML IJ SOLN
INTRAMUSCULAR | Status: DC | PRN
  Administered 2018-09-24: 80 mg via INTRAVENOUS

## 2018-09-24 MED ORDER — FOLIC ACID 1 MG PO TABS
2.0000 mg | ORAL_TABLET | Freq: Every day | ORAL | Status: DC
  Administered 2018-09-25: 2 mg via ORAL
  Filled 2018-09-24: qty 2

## 2018-09-24 MED ORDER — FENTANYL CITRATE (PF) 100 MCG/2ML IJ SOLN
INTRAMUSCULAR | Status: DC | PRN
  Administered 2018-09-24 (×4): 25 ug via INTRAVENOUS

## 2018-09-24 MED ORDER — HYDROXYCHLOROQUINE SULFATE 200 MG PO TABS
200.0000 mg | ORAL_TABLET | Freq: Every day | ORAL | Status: DC
  Administered 2018-09-24: 22:00:00 200 mg via ORAL
  Filled 2018-09-24 (×2): qty 1

## 2018-09-24 MED ORDER — CALCIUM CARBONATE-VITAMIN D 500-200 MG-UNIT PO TABS
1.0000 | ORAL_TABLET | Freq: Every day | ORAL | Status: DC
  Administered 2018-09-25: 08:00:00 1 via ORAL
  Filled 2018-09-24: qty 1

## 2018-09-24 MED ORDER — SODIUM CHLORIDE (PF) 0.9 % IJ SOLN
INTRAMUSCULAR | Status: AC
  Filled 2018-09-24: qty 50

## 2018-09-24 MED ORDER — DIPHENHYDRAMINE HCL 12.5 MG/5ML PO ELIX
12.5000 mg | ORAL_SOLUTION | ORAL | Status: DC | PRN
  Filled 2018-09-24: qty 10

## 2018-09-24 MED ORDER — TRAMADOL HCL 50 MG PO TABS
50.0000 mg | ORAL_TABLET | Freq: Four times a day (QID) | ORAL | Status: DC | PRN
  Filled 2018-09-24: qty 1

## 2018-09-24 MED ORDER — ASPIRIN EC 325 MG PO TBEC: 325.0000 mg | DELAYED_RELEASE_TABLET | Freq: Every day | ORAL | 0 refills | Status: DC

## 2018-09-24 MED ORDER — PHENYLEPHRINE HCL (PRESSORS) 10 MG/ML IV SOLN
INTRAVENOUS | Status: AC
  Filled 2018-09-24: qty 1

## 2018-09-24 MED ORDER — ROCURONIUM BROMIDE 100 MG/10ML IV SOLN
INTRAVENOUS | Status: DC | PRN
  Administered 2018-09-24: 35 mg via INTRAVENOUS
  Administered 2018-09-24: 5 mg via INTRAVENOUS
  Administered 2018-09-24: 10 mg via INTRAVENOUS

## 2018-09-24 MED ORDER — SODIUM CHLORIDE 0.9 % IV BOLUS
500.0000 mL | Freq: Once | INTRAVENOUS | Status: AC
  Administered 2018-09-24: 500 mL via INTRAVENOUS

## 2018-09-24 MED ORDER — LACTATED RINGERS IV SOLN
INTRAVENOUS | Status: DC
  Administered 2018-09-24: 09:00:00 via INTRAVENOUS

## 2018-09-24 MED ORDER — NEOMYCIN-POLYMYXIN B GU 40-200000 IR SOLN
Status: DC | PRN
  Administered 2018-09-24: 14 mL

## 2018-09-24 MED ORDER — ACETAMINOPHEN 325 MG PO TABS: 325.0000 mg | ORAL_TABLET | Freq: Four times a day (QID) | ORAL | Status: DC | PRN

## 2018-09-24 MED ORDER — FLEET ENEMA 7-19 GM/118ML RE ENEM: 1.0000 | ENEMA | Freq: Once | RECTAL | Status: DC | PRN

## 2018-09-24 MED ORDER — ONDANSETRON HCL 4 MG/2ML IJ SOLN
INTRAMUSCULAR | Status: AC
  Filled 2018-09-24: qty 2

## 2018-09-24 MED ORDER — MAGNESIUM HYDROXIDE 400 MG/5ML PO SUSP
30.0000 mL | Freq: Every day | ORAL | Status: DC | PRN
  Filled 2018-09-24: qty 30

## 2018-09-24 MED ORDER — BUPIVACAINE LIPOSOME 1.3 % IJ SUSP
INTRAMUSCULAR | Status: AC
  Filled 2018-09-24: qty 20

## 2018-09-24 MED ORDER — FENTANYL CITRATE (PF) 100 MCG/2ML IJ SOLN: 25.0000 ug | INTRAMUSCULAR | Status: DC | PRN

## 2018-09-24 MED ORDER — FENTANYL CITRATE (PF) 100 MCG/2ML IJ SOLN
INTRAMUSCULAR | Status: AC
  Administered 2018-09-24: 25 ug via INTRAVENOUS
  Filled 2018-09-24: qty 2

## 2018-09-24 MED ORDER — FENTANYL CITRATE (PF) 100 MCG/2ML IJ SOLN
INTRAMUSCULAR | Status: AC
  Filled 2018-09-24: qty 2

## 2018-09-24 MED ORDER — LIDOCAINE HCL (PF) 1 % IJ SOLN
INTRAMUSCULAR | Status: DC | PRN
  Administered 2018-09-24: 4 mL via INTRADERMAL

## 2018-09-24 MED ORDER — METOCLOPRAMIDE HCL 5 MG/ML IJ SOLN: 5.0000 mg | Freq: Three times a day (TID) | INTRAMUSCULAR | Status: DC | PRN

## 2018-09-24 MED ORDER — SUGAMMADEX SODIUM 200 MG/2ML IV SOLN
INTRAVENOUS | Status: DC | PRN
  Administered 2018-09-24: 150 mg via INTRAVENOUS

## 2018-09-24 MED ORDER — CYCLOBENZAPRINE HCL 10 MG PO TABS
10.0000 mg | ORAL_TABLET | Freq: Three times a day (TID) | ORAL | Status: DC | PRN
  Administered 2018-09-24: 10 mg via ORAL
  Filled 2018-09-24: qty 1

## 2018-09-24 MED ORDER — BISACODYL 10 MG RE SUPP: 10.0000 mg | Freq: Every day | RECTAL | Status: DC | PRN

## 2018-09-24 MED ORDER — LIDOCAINE HCL (PF) 2 % IJ SOLN
INTRAMUSCULAR | Status: AC
  Filled 2018-09-24: qty 10

## 2018-09-24 MED ORDER — OXYCODONE HCL 5 MG/5ML PO SOLN: 5.0000 mg | Freq: Once | ORAL | Status: DC | PRN

## 2018-09-24 MED ORDER — FAMOTIDINE 20 MG PO TABS
20.0000 mg | ORAL_TABLET | Freq: Once | ORAL | Status: AC
  Administered 2018-09-24: 07:00:00 20 mg via ORAL

## 2018-09-24 MED ORDER — PHENYLEPHRINE HCL (PRESSORS) 10 MG/ML IV SOLN
INTRAVENOUS | Status: DC | PRN
  Administered 2018-09-24 (×2): 100 ug via INTRAVENOUS

## 2018-09-24 MED ORDER — HYDROCODONE-ACETAMINOPHEN 5-325 MG PO TABS: 1.0000 | ORAL_TABLET | ORAL | 0 refills | Status: DC | PRN

## 2018-09-24 MED ORDER — LIDOCAINE HCL (PF) 1 % IJ SOLN
INTRAMUSCULAR | Status: AC
  Filled 2018-09-24: qty 5

## 2018-09-24 MED ORDER — CEFAZOLIN SODIUM-DEXTROSE 2-4 GM/100ML-% IV SOLN
INTRAVENOUS | Status: AC
  Filled 2018-09-24: qty 100

## 2018-09-24 MED ORDER — BUPIVACAINE-EPINEPHRINE (PF) 0.5% -1:200000 IJ SOLN
INTRAMUSCULAR | Status: AC
  Filled 2018-09-24: qty 30

## 2018-09-24 MED ORDER — MIDAZOLAM HCL 2 MG/2ML IJ SOLN
1.0000 mg | Freq: Once | INTRAMUSCULAR | Status: AC
  Administered 2018-09-24: 1 mg via INTRAVENOUS

## 2018-09-24 MED ORDER — ROCURONIUM BROMIDE 50 MG/5ML IV SOLN
INTRAVENOUS | Status: AC
  Filled 2018-09-24: qty 1

## 2018-09-24 MED ORDER — FOLIC ACID 1 MG PO TABS: 2.0000 mg | ORAL_TABLET | Freq: Every day | ORAL | Status: DC

## 2018-09-24 MED ORDER — DEXAMETHASONE SODIUM PHOSPHATE 10 MG/ML IJ SOLN
INTRAMUSCULAR | Status: DC | PRN
  Administered 2018-09-24: 10 mg via INTRAVENOUS

## 2018-09-24 MED ORDER — SODIUM CHLORIDE 0.9 % IV SOLN
INTRAVENOUS | Status: DC
  Administered 2018-09-24: 16:00:00 via INTRAVENOUS

## 2018-09-24 MED ORDER — TRANEXAMIC ACID 1000 MG/10ML IV SOLN
INTRAVENOUS | Status: AC
  Filled 2018-09-24: qty 10

## 2018-09-24 MED ORDER — ONDANSETRON HCL 4 MG PO TABS: 4.0000 mg | ORAL_TABLET | Freq: Four times a day (QID) | ORAL | Status: DC | PRN

## 2018-09-24 MED ORDER — BUPIVACAINE HCL (PF) 0.5 % IJ SOLN
INTRAMUSCULAR | Status: AC
  Filled 2018-09-24: qty 10

## 2018-09-24 MED ORDER — LIDOCAINE HCL (CARDIAC) PF 100 MG/5ML IV SOSY
PREFILLED_SYRINGE | INTRAVENOUS | Status: DC | PRN
  Administered 2018-09-24: 50 mg via INTRAVENOUS

## 2018-09-24 MED ORDER — CEFAZOLIN SODIUM-DEXTROSE 1-4 GM/50ML-% IV SOLN
1.0000 g | Freq: Once | INTRAVENOUS | Status: DC
  Filled 2018-09-24: qty 50

## 2018-09-24 MED ORDER — ENOXAPARIN SODIUM 40 MG/0.4ML ~~LOC~~ SOLN
40.0000 mg | SUBCUTANEOUS | Status: DC
  Administered 2018-09-25: 40 mg via SUBCUTANEOUS
  Filled 2018-09-24: qty 0.4

## 2018-09-24 MED ORDER — ONDANSETRON HCL 4 MG/2ML IJ SOLN
INTRAMUSCULAR | Status: DC | PRN
  Administered 2018-09-24: 4 mg via INTRAVENOUS

## 2018-09-24 MED ORDER — DEXAMETHASONE SODIUM PHOSPHATE 10 MG/ML IJ SOLN
INTRAMUSCULAR | Status: AC
  Filled 2018-09-24: qty 1

## 2018-09-24 MED ORDER — FOLIC ACID 1 MG PO TABS
2.0000 mg | ORAL_TABLET | Freq: Once | ORAL | Status: AC
  Administered 2018-09-24: 2 mg via ORAL
  Filled 2018-09-24: qty 2

## 2018-09-24 MED ORDER — BUPIVACAINE-EPINEPHRINE (PF) 0.5% -1:200000 IJ SOLN
INTRAMUSCULAR | Status: DC | PRN
  Administered 2018-09-24: 30 mL

## 2018-09-24 MED ORDER — SODIUM CHLORIDE 0.9 % IV SOLN
INTRAVENOUS | Status: DC | PRN
  Administered 2018-09-24: 25 ug/min via INTRAVENOUS

## 2018-09-24 MED ORDER — MORPHINE SULFATE (PF) 2 MG/ML IV SOLN: 0.5000 mg | INTRAVENOUS | Status: DC | PRN

## 2018-09-24 MED ORDER — BUPIVACAINE HCL (PF) 0.5 % IJ SOLN
INTRAMUSCULAR | Status: DC | PRN
  Administered 2018-09-24: 10 mL via PERINEURAL

## 2018-09-24 SURGICAL SUPPLY — 75 items
BAG DECANTER FOR FLEXI CONT (MISCELLANEOUS) ×2 IMPLANT
BASEPLATE BOSS DRILL (MISCELLANEOUS) ×1 IMPLANT
BIT DRILL 2.5 (BIT) ×1
BIT DRILL 2.5X4.5XSCR (BIT) IMPLANT
BIT DRILL F/BASEPLATE CENTRAL (BIT) IMPLANT
BIT DRL 2.5X4.5XSCR (BIT) ×1
BLADE SAGITTAL WIDE XTHICK NO (BLADE) ×2 IMPLANT
BOWL CEMENT MIX W/ADAPTER (MISCELLANEOUS) ×1 IMPLANT
CANISTER SUCT 1200ML W/VALVE (MISCELLANEOUS) ×2 IMPLANT
CANISTER SUCT 3000ML PPV (MISCELLANEOUS) ×4 IMPLANT
CHLORAPREP W/TINT 26 (MISCELLANEOUS) ×2 IMPLANT
COOLER POLAR GLACIER W/PUMP (MISCELLANEOUS) ×2 IMPLANT
COVER WAND RF STERILE (DRAPES) ×2 IMPLANT
DRAPE IMP U-DRAPE 54X76 (DRAPES) ×4 IMPLANT
DRAPE INCISE IOBAN 66X45 STRL (DRAPES) ×4 IMPLANT
DRAPE SHEET LG 3/4 BI-LAMINATE (DRAPES) ×4 IMPLANT
DRAPE TABLE BACK 80X90 (DRAPES) ×2 IMPLANT
DRILL BASEPLATE CENTRAL  S (BIT) ×1
DRILL BASEPLATE CENTRAL S (BIT) ×1
DRSG OPSITE POSTOP 4X8 (GAUZE/BANDAGES/DRESSINGS) ×2 IMPLANT
ELECT BLADE 6.5 EXT (BLADE) ×2 IMPLANT
ELECT CAUTERY BLADE 6.4 (BLADE) ×2 IMPLANT
GAUZE PACK 2X3YD (GAUZE/BANDAGES/DRESSINGS) ×2 IMPLANT
GLENOSPHERE RSS 2 CONCENTRIC (Shoulder) ×1 IMPLANT
GLOVE BIO SURGEON STRL SZ7.5 (GLOVE) ×8 IMPLANT
GLOVE BIO SURGEON STRL SZ8 (GLOVE) ×8 IMPLANT
GLOVE BIOGEL PI IND STRL 8 (GLOVE) ×1 IMPLANT
GLOVE BIOGEL PI INDICATOR 8 (GLOVE) ×1
GLOVE INDICATOR 8.0 STRL GRN (GLOVE) ×2 IMPLANT
GOWN STRL REUS W/ TWL LRG LVL3 (GOWN DISPOSABLE) ×1 IMPLANT
GOWN STRL REUS W/ TWL XL LVL3 (GOWN DISPOSABLE) ×1 IMPLANT
GOWN STRL REUS W/TWL LRG LVL3 (GOWN DISPOSABLE) ×1
GOWN STRL REUS W/TWL XL LVL3 (GOWN DISPOSABLE) ×1
GUIDE PIN 2.0 S150MM (PIN) ×1 IMPLANT
HOOD PEEL AWAY FLYTE STAYCOOL (MISCELLANEOUS) ×6 IMPLANT
IV NS 100ML SINGLE PACK (IV SOLUTION) ×2 IMPLANT
KIT STABILIZATION SHOULDER (MISCELLANEOUS) ×2 IMPLANT
LINER STD POLY +0 (Liner) ×1 IMPLANT
MASK FACE SPIDER DISP (MASK) ×2 IMPLANT
MAT ABSORB  FLUID 56X50 GRAY (MISCELLANEOUS) ×1
MAT ABSORB FLUID 56X50 GRAY (MISCELLANEOUS) ×1 IMPLANT
NDL SAFETY ECLIPSE 18X1.5 (NEEDLE) ×1 IMPLANT
NDL SPNL 20GX3.5 QUINCKE YW (NEEDLE) ×1 IMPLANT
NEEDLE HYPO 18GX1.5 SHARP (NEEDLE) ×1
NEEDLE SPNL 20GX3.5 QUINCKE YW (NEEDLE) ×2 IMPLANT
NS IRRIG 500ML POUR BTL (IV SOLUTION) ×2 IMPLANT
PACK ARTHROSCOPY SHOULDER (MISCELLANEOUS) ×2 IMPLANT
PAD WRAPON POLAR SHDR UNIV (MISCELLANEOUS) ×1 IMPLANT
PLATE BASE REVERSE RSS S (Plate) ×1 IMPLANT
PULSAVAC PLUS IRRIG FAN TIP (DISPOSABLE) ×2
SCREW 4.5X15 RSS W CAP (Screw) ×2 IMPLANT
SCREW 4.5X25 RSS W CAP (Screw) ×2 IMPLANT
SCREW 4.5X50 RSS W CAP (Screw) ×1 IMPLANT
SCREW BN 40X4.5XSTAR CAP (Screw) IMPLANT
SCREW BODY REVERSE SMALL TITAN (Screw) ×1 IMPLANT
SLING ULTRA II M (MISCELLANEOUS) ×2 IMPLANT
SOL .9 NS 3000ML IRR  AL (IV SOLUTION) ×1
SOL .9 NS 3000ML IRR UROMATIC (IV SOLUTION) ×1 IMPLANT
SPONGE LAP 18X18 RF (DISPOSABLE) ×2 IMPLANT
STAPLER SKIN PROX 35W (STAPLE) ×2 IMPLANT
STEM PRESS FIT SZ13 TSS (Stem) ×1 IMPLANT
STRAP SAFETY 5IN WIDE (MISCELLANEOUS) ×2 IMPLANT
SUT ETHIBOND 0 MO6 C/R (SUTURE) ×2 IMPLANT
SUT FIBERWIRE #2 38 BLUE 1/2 (SUTURE) ×8
SUT VIC AB 0 CT1 36 (SUTURE) ×4 IMPLANT
SUT VIC AB 2-0 CT1 27 (SUTURE) ×2
SUT VIC AB 2-0 CT1 TAPERPNT 27 (SUTURE) ×2 IMPLANT
SUTURE FIBERWR #2 38 BLUE 1/2 (SUTURE) ×2 IMPLANT
SYR 10ML LL (SYRINGE) ×2 IMPLANT
SYR 30ML LL (SYRINGE) ×4 IMPLANT
SYRINGE IRR TOOMEY STRL 70CC (SYRINGE) ×2 IMPLANT
TAPE TRANSPORE STRL 2 31045 (GAUZE/BANDAGES/DRESSINGS) ×2 IMPLANT
TIP FAN IRRIG PULSAVAC PLUS (DISPOSABLE) ×1 IMPLANT
TRAY FOLEY MTR SLVR 16FR STAT (SET/KITS/TRAYS/PACK) ×2 IMPLANT
WRAPON POLAR PAD SHDR UNIV (MISCELLANEOUS) ×2

## 2018-09-24 NOTE — Anesthesia Preprocedure Evaluation (Addendum)
Anesthesia Evaluation  Patient identified by MRN, date of birth, ID band Patient awake    Reviewed: Allergy & Precautions, H&P , NPO status , Patient's Chart, lab work & pertinent test results  History of Anesthesia Complications Negative for: history of anesthetic complications  Airway Mallampati: III  TM Distance: >3 FB Neck ROM: limited    Dental  (+) Chipped   Pulmonary neg pulmonary ROS, neg shortness of breath,           Cardiovascular Exercise Tolerance: Good hypertension, (-) angina(-) Past MI and (-) DOE      Neuro/Psych PSYCHIATRIC DISORDERS negative neurological ROS     GI/Hepatic Neg liver ROS, GERD  Medicated and Controlled,  Endo/Other  diabetes, Type 2  Renal/GU      Musculoskeletal   Abdominal   Peds  Hematology negative hematology ROS (+)   Anesthesia Other Findings Past Medical History: No date: Anemia No date: Anxiety No date: Arthritis     Comment:  osteoarthritis. Bilateral knee replacement, hands,               meniscal tear, cervical disc disease No date: Atherosclerosis No date: Atypical chest pain No date: Collagen vascular disease ([place]) 12/15/2017: Colon polyp No date: Colon polyps No date: Compression fracture of L2 (HCC) No date: Diabetes mellitus without complication (HCC)     Comment:  pre-diabetes No date: Diverticulosis No date: DVT (deep venous thrombosis) (HCC) No date: GERD (gastroesophageal reflux disease) No date: History of seronegative inflammatory arthritis No date: Hypertension No date: IBS (irritable bowel syndrome) No date: Internal hemorrhoids No date: Phlebitis and thrombophlebitis of deep veins of lower  extremities ([place])     Comment:  after surgery No date: Vision abnormalities  Past Surgical History: No date: APPENDECTOMY 10/24/2017: COLONOSCOPY WITH PROPOFOL; N/A     Comment:  Procedure: COLONOSCOPY WITH PROPOFOL;  Surgeon: Manya Silvas, MD;  Location: [facility] ENDOSCOPY;  Service:               Endoscopy;  Laterality: N/A; 11/21/2017: COLONOSCOPY WITH PROPOFOL; N/A     Comment:  Procedure: COLONOSCOPY WITH PROPOFOL;  Surgeon: Manya Silvas, MD;  Location: [facility] ENDOSCOPY;  Service:               Endoscopy;  Laterality: N/A; No date: DILATION AND CURETTAGE OF UTERUS 02/25/2018: ESOPHAGOGASTRODUODENOSCOPY (EGD) WITH PROPOFOL; N/A     Comment:  Procedure: ESOPHAGOGASTRODUODENOSCOPY (EGD) WITH               PROPOFOL;  Surgeon: Manya Silvas, MD;  Location:               [facility] ENDOSCOPY;  Service: Endoscopy;  Laterality: N/A; No date: EYE SURGERY     Comment:  bilateral catarACT No date: JOINT REPLACEMENT; Bilateral     Comment:  knee replacement 12/15/2017: LAPAROSCOPIC RIGHT COLECTOMY; Right     Comment:  Procedure: LAPAROSCOPIC RIGHT COLECTOMY;  Surgeon:               Robert Bellow, MD;  Location: [facility] ORS;  Service:               General;  Laterality: Right; No date: REPLACEMENT TOTAL KNEE BILATERAL     Reproductive/Obstetrics negative OB ROS  Anesthesia Physical Anesthesia Plan  ASA: III  Anesthesia Plan: General ETT   Post-op Pain Management:    Induction: Intravenous  PONV Risk Score and Plan: Ondansetron, Dexamethasone, Midazolam and Treatment may vary due to age or medical condition  Airway Management Planned: Oral ETT  Additional Equipment:   Intra-op Plan:   Post-operative Plan: Extubation in OR  Informed Consent: I have reviewed the patients History and Physical, chart, labs and discussed the procedure including the risks, benefits and alternatives for the proposed anesthesia with the patient or authorized representative who has indicated his/her understanding and acceptance.     Dental Advisory Given  Plan Discussed with: Anesthesiologist, CRNA and Surgeon  Anesthesia Plan Comments: (Patient consented for  risks of anesthesia including but not limited to:  - adverse reactions to medications - damage to teeth, lips or other oral mucosa - sore throat or hoarseness - Damage to heart, brain, lungs or loss of life  Patient voiced understanding.)        Anesthesia Quick Evaluation

## 2018-09-24 NOTE — Discharge Summary (Signed)
Physician Discharge Summary  Patient ID: Teresa Lin MRN: [id number] DOB/AGE: [birth year] 85 y.o.  Admit date: 09/24/2018 Discharge date: 09/25/18  Admission Diagnoses:  RIGHT SHOULDER PROXIMAL HUMERUS FRACTURE  Discharge Diagnoses: Patient Active Problem List   Diagnosis Date Noted  . Status post reverse total shoulder replacement, right 09/24/2018  . S/P right colectomy 01/26/2018  . Adenomatous polyp of ascending colon 11/24/2017  . Generalized anxiety disorder 09/06/2017  . Irritable bowel syndrome (IBS) 09/06/2017  . Fracture of one rib, right side, subsequent encounter for fracture with routine healing 03/08/2017  . Orthostasis 06/05/2016  . Left shoulder pain 10/13/2015  . Constipation 05/30/2015  . Osteoporosis, postmenopausal 05/30/2015  . Compression fracture of L2 lumbar vertebra with delayed healing 05/29/2015  . Frequent falls 03/26/2015  . Atherosclerosis of arteries 03/26/2015  . Chest pain 02/09/2015  . Breast cancer screening 10/01/2014  . Family history of colon cancer 09/28/2014  . Viral URI with cough 06/12/2014  . Tenesmus (rectal) 06/12/2014  . TMJ (temporomandibular joint syndrome) 06/12/2014  . Varicose veins of both lower extremities without ulcer or inflammation 06/12/2014  . B12 deficiency 09/20/2013  . Fatigue 05/23/2013  . Internal hemorrhoids without complication 52/77/8242  . Insomnia 03/03/2013  . Vertigo due to cerebrovascular disease 03/03/2013  . Seronegative rheumatoid arthritis affecting lower leg ([place]) 03/03/2013    Past Medical History:  Diagnosis Date  . Anemia   . Anxiety   . Arthritis    osteoarthritis. Bilateral knee replacement, hands, meniscal tear, cervical disc disease  . Atherosclerosis   . Atypical chest pain   . Collagen vascular disease ([place])   . Colon polyp 12/15/2017  . Colon polyps   . Compression fracture of L2 ([place])   . Diverticulosis   . DVT (deep venous thrombosis) ([place])   . GERD (gastroesophageal reflux  disease)   . History of seronegative inflammatory arthritis   . Hypertension   . IBS (irritable bowel syndrome)   . Internal hemorrhoids   . Phlebitis and thrombophlebitis of deep veins of lower extremities (HCC)    after surgery  . Vision abnormalities      Transfusion: None.   Consultants (if any):   Discharged Condition: Improved  Hospital Course: Teresa Lin is an 85 y.o. female who was admitted 09/24/2018 with a diagnosis of a closed, displaced three-part proximal humerus fracture of the right shoulderand went to the operating room on 09/24/2018 and underwent the above named procedures.    Surgeries: Procedure(s): TOTAL SHOULDER ARTHROPLASTY - REVERSE RIGHT on 09/24/2018 Patient tolerated the surgery well. Taken to PACU where she was stabilized and then transferred to the orthopedic floor.  Started on Lovenox 40mg  q 24 hrs. Foot pumps applied bilaterally at 80 mm. Heels elevated on bed with rolled towels. No evidence of DVT. Negative [place]oman. Physical therapy started on day #1 for gait training and transfer. OT started day #1 for ADL and assisted devices.  Patient's IV was removed on POD1.  Implants: All press-fit Integra system with a 13 mm stem, a small metaphyseal body, a +0 mm humeral platform, a mini baseplate, and a 38 mm concentric 2 mm laterally offset glenosphere.  She was given perioperative antibiotics:  Anti-infectives (From admission, onward)   Start     Dose/Rate Route Frequency Ordered Stop   09/25/18 0430  ceFAZolin (ANCEF) IVPB 1 g/50 mL premix  Status:  Discontinued     1 g 100 mL/hr over 30 Minutes Intravenous  Once 09/24/18 2359 09/25/18 0418   09/25/18  0430  ceFAZolin (ANCEF) IVPB 2g/100 mL premix     2 g 200 mL/hr over 30 Minutes Intravenous  Once 09/25/18 0418 09/25/18 0454   09/24/18 1400  hydroxychloroquine (PLAQUENIL) tablet 200 mg     200 mg Oral Daily 09/24/18 1341     09/24/18 1345  ceFAZolin (ANCEF) IVPB 2g/100 mL premix     2 g 200 mL/hr over  30 Minutes Intravenous Every 6 hours 09/24/18 1341 09/25/18 0459   09/24/18 0716  ceFAZolin (ANCEF) 2-4 GM/100ML-% IVPB    Note to Pharmacy:  Ronnell Freshwater   : cabinet override      09/24/18 0716 09/24/18 1929   09/23/18 2145  ceFAZolin (ANCEF) IVPB 2g/100 mL premix     2 g 200 mL/hr over 30 Minutes Intravenous  Once 09/23/18 2135 09/24/18 1016    .  She was given sequential compression devices, early ambulation, and Lovenox for DVT prophylaxis.  She benefited maximally from the hospital stay and there were no complications.    Recent vital signs:  Vitals:   09/24/18 2337 09/25/18 0334  BP:  140/62  Pulse:  78  Resp:  20  Temp: 97.8 F (36.6 C) 97.6 F (36.4 C)  SpO2:  97%    Recent laboratory studies:  Lab Results  Component Value Date   HGB 10.8 (L) 09/25/2018   HGB 12.1 09/22/2018   HGB 13.5 12/18/2017   Lab Results  Component Value Date   WBC 10.4 09/25/2018   PLT 280 09/25/2018   Lab Results  Component Value Date   INR 0.9 08/08/2011   Lab Results  Component Value Date   NA 141 09/25/2018   K 3.5 09/25/2018   CL 107 09/25/2018   CO2 27 09/25/2018   BUN 14 09/25/2018   CREATININE 0.44 09/25/2018   GLUCOSE 89 09/25/2018    Discharge Medications:   Allergies as of 09/25/2018      Reactions   Infliximab Other (See Comments)   Shaking, pain   Synvisc [hylan G-f 20] Swelling      Medication List    TAKE these medications   alendronate 70 MG tablet Commonly known as:  FOSAMAX Take 70 mg by mouth every Monday.   aspirin EC 325 MG tablet Take 1 tablet (325 mg total) by mouth daily.   calcium-vitamin D 500-200 MG-UNIT tablet Commonly known as:  OSCAL WITH D Take 1 tablet by mouth daily.   cyclobenzaprine 10 MG tablet Commonly known as:  FLEXERIL Take 10 mg by mouth 3 (three) times daily as needed for muscle spasms.   folic acid 1 MG tablet Commonly known as:  FOLVITE Take 2 mg by mouth daily.   HYDROcodone-acetaminophen 5-325 MG  tablet Commonly known as:  NORCO/VICODIN Take 1-2 tablets by mouth every 4 (four) hours as needed for moderate pain. What changed:    how much to take  when to take this  reasons to take this   hydroxychloroquine 200 MG tablet Commonly known as:  PLAQUENIL Take 200 mg by mouth daily.   meloxicam 15 MG tablet Commonly known as:  MOBIC Take 15 mg by mouth daily.   methotrexate 50 MG/2ML injection Inject 20 mg into the skin every Sunday.   multivitamin with minerals tablet Take 1 tablet by mouth daily. Centrum Silver   ORENCIA IV Inject into the vein every 30 (thirty) days.   Systane 0.4-0.3 % Soln Generic drug:  Polyethyl Glycol-Propyl Glycol Place 1 drop into both eyes 3 (three) times daily  as needed (for eye burning/irritation).       Diagnostic Studies: Ct Shoulder Right Wo Contrast  Result Date: 09/18/2018 CLINICAL DATA:  Status post fall, humeral fracture EXAM: CT OF THE UPPER RIGHT EXTREMITY WITHOUT CONTRAST TECHNIQUE: Multidetector CT imaging of the upper right extremity was performed according to the standard protocol. COMPARISON:  None. FINDINGS: Bones/Joint/Cartilage Acute fracture of the surgical neck of the right proximal humerus with mild comminution and with 6 mm medial displacement. Fracture extends into the humeral head with a comminuted fracture of the greater tuberosity with 12 mm of anterolateral displacement. No other acute fracture or dislocation.  Small joint effusion. No aggressive osseous lesion. Ligaments Ligaments are suboptimally evaluated by CT. Muscles and Tendons Muscles are normal. No muscle atrophy. No fluid collection or hematoma. Small 10 mm lipoma in the right deltoid muscle. Soft tissue No fluid collection or hematoma.  No soft tissue mass. IMPRESSION: 1. Acute fracture of the surgical neck of the right proximal humerus with mild comminution and with 6 mm medial displacement. Fracture extends into the humeral head with a comminuted fracture of the  greater tuberosity with 12 mm of anterolateral displacement. Electronically Signed   By: Kathreen Devoid   On: 09/18/2018 15:43   Dg Shoulder Right Port  Result Date: 09/24/2018 CLINICAL DATA:  Status post reversed shoulder replacement for fracture EXAM: PORTABLE RIGHT SHOULDER COMPARISON:  CT right shoulder 09/18/2018 FINDINGS: Total shoulder replacement in satisfactory position alignment. No fracture or complication IMPRESSION: Satisfactory right shoulder replacement. Electronically Signed   By: Franchot Gallo M.D.   On: 09/24/2018 13:14   Disposition: Plan is for discharge home on 09/25/18 pending progress with PT.  Follow-up Information    Lattie Corns, PA-C Follow up in 14 day(s).   Specialty:  Physician Assistant Why:  Electa Sniff information: [place] [address] [phone]          Signed: Judson Roch PA-C 09/25/2018, 6:59 AM

## 2018-09-24 NOTE — Evaluation (Signed)
Physical Therapy Evaluation Patient Details Name: Teresa Lin MRN: [id number] DOB: 1931-06-30 Today's Date: 09/24/2018   History of Present Illness  85 y/o female s/p R reverse total shoulder replacement 5/14.    Clinical Impression  Pt did well with POD0 PT exam, though R UE still numb with very limited hand/finger movement.  She did very well with balance, gait/stair training and transfers, however she was unable to get herself up to sitting w/o assist. Pt did not realize the extent/duration of R UE limitations and did not anticipate struggling with ADLs, etc going forward.  Pt will have 24/7 assist initially but reports she needs to talk with neighbors, etc for assist going forward.  Spent a lot of time educating pt on her limitations, post-op protocol and expected course of recovery.  She showed great effort but was taken off guard by her limitations; encouraged her to work on home assist after the first week.    Follow Up Recommendations Home health PT;Follow surgeon's recommendation for DC plan and follow-up therapies;Supervision - Intermittent    Equipment Recommendations       Recommendations for Other Services       Precautions / Restrictions Precautions Precautions: Shoulder;Fall Type of Shoulder Precautions: reverse total shoulder Shoulder Interventions: Shoulder sling/immobilizer Restrictions Weight Bearing Restrictions: Yes RUE Weight Bearing: Non weight bearing      Mobility  Bed Mobility Overal bed mobility: Needs Assistance Bed Mobility: Supine to Sit     Supine to sit: Min assist     General bed mobility comments: Pt showed good effort in getting to sitting, but despite effort she was unable to rise w/o direct PT assist  Transfers Overall transfer level: Independent Equipment used: None             General transfer comment: Pt easily gets to standing w/o assist, able to maintain balance w/o issue  Ambulation/Gait Ambulation/Gait assistance:  Independent Gait Distance (Feet): 250 Feet Assistive device: None     Gait velocity interpretation: >2.62 ft/sec, indicative of community ambulatory General Gait Details: Pt able to ambulate at community appropriate speed and safety.  She had no fatigue or hesitation t/o the effort.  Stairs Stairs: Yes Stairs assistance: Supervision Stair Management: No rails;One rail Left Number of Stairs: 5 General stair comments: Pt was able to negotiate steps with and w/o rail, clearly safer and more confident with rail with mild unsteadiness without UE use.   Wheelchair Mobility    Modified Rankin (Stroke Patients Only)       Balance Overall balance assessment: Independent                                           Pertinent Vitals/Pain Pain Assessment: No/denies pain    Home Living Family/patient expects to be discharged to:: Private residence Living Arrangements: Alone Available Help at Discharge: Available PRN/intermittently(children 24/7 for first week then neighbors?)   Home Access: Stairs to enter Entrance Stairs-Rails: None Entrance Stairs-Number of Steps: 1   Home Equipment: Walker - 2 wheels      Prior Function Level of Independence: Independent         Comments: Pt drives, runs all her errands, works in yard, Quarry manager        Extremity/Trunk Assessment   Upper Extremity Assessment Upper Extremity Assessment: (R UE in immobilizer, not tested)  Lower Extremity Assessment Lower Extremity Assessment: Overall WFL for tasks assessed       Communication   Communication: No difficulties  Cognition Arousal/Alertness: Awake/alert Behavior During Therapy: WFL for tasks assessed/performed Overall Cognitive Status: Within Functional Limits for tasks assessed                                        General Comments      Exercises     Assessment/Plan    PT Assessment Patient needs continued PT services   PT Problem List Decreased strength;Decreased range of motion;Decreased activity tolerance;Decreased knowledge of use of DME;Decreased safety awareness;Pain;Decreased knowledge of precautions       PT Treatment Interventions Gait training;Stair training;Functional mobility training;Therapeutic activities;Therapeutic exercise;Balance training;Neuromuscular re-education;Patient/family education    PT Goals (Current goals can be found in the Care Plan section)  Acute Rehab PT Goals Patient Stated Goal: get back to her normal routine PT Goal Formulation: With patient Time For Goal Achievement: 10/08/18 Potential to Achieve Goals: Good    Frequency BID   Barriers to discharge        Co-evaluation               AM-PAC PT "6 Clicks" Mobility  Outcome Measure Help needed turning from your back to your side while in a flat bed without using bedrails?: A Little Help needed moving from lying on your back to sitting on the side of a flat bed without using bedrails?: A Little Help needed moving to and from a bed to a chair (including a wheelchair)?: None Help needed standing up from a chair using your arms (e.g., wheelchair or bedside chair)?: None Help needed to walk in hospital room?: None Help needed climbing 3-5 steps with a railing? : A Little 6 Click Score: 21    End of Session Equipment Utilized During Treatment: Gait belt Activity Tolerance: Patient tolerated treatment well Patient left: with chair alarm set;with call bell/phone within reach   PT Visit Diagnosis: Muscle weakness (generalized) (M62.81);Pain Pain - Right/Left: Right Pain - part of body: Shoulder    Time: 9470-9628 PT Time Calculation (min) (ACUTE ONLY): 44 min   Charges:   PT Evaluation $PT Eval Low Complexity: 1 Low PT Treatments $Gait Training: 8-22 mins $Therapeutic Activity: 8-22 mins        Kreg Shropshire, DPT 09/24/2018, 5:11 PM

## 2018-09-24 NOTE — H&P (Signed)
Paper H&P to be scanned into permanent record. H&P reviewed and patient re-examined. No changes. 

## 2018-09-24 NOTE — TOC Initial Note (Signed)
Transition of Care [facility]) - Initial/Assessment Note    Patient Details  Name: Teresa Lin MRN: [id number] Date of Birth: 02/05/1932  Transition of Care [facility]) CM/SW Contact:    Su Hilt, RN Phone Number: 09/24/2018, 4:18 PM  Clinical Narrative:                 Met with the patient to discuss DC needs and plans She lives alone but has a private pay sitter that helps her at home 8-12 and 6-9 She walked really well with PT and does not need HH PT at this time Her son and daughter will be helping her for the next several days I provided her with a private p[ay sitter list She has no needs for DME at home Her transportation is provided by family and friends  Dr. Netty Starring is [facility] PCP [facility]  She can afford her medications CM will continue to monitor for needs Expected Discharge Plan: Home/Self Care Barriers to Discharge: Continued Medical Work up   Patient Goals and CMS Choice Patient states their goals for this hospitalization and ongoing recovery are:: go home      Expected Discharge Plan and Services Expected Discharge Plan: Home/Self Care   Discharge Planning Services: CM Consult   Living arrangements for the past 2 months: Single Family Home                                      Prior Living Arrangements/Services Living arrangements for the past 2 months: Single Family Home Lives with:: Self Patient language and need for interpreter reviewed:: No Do you feel safe going back to the place where you live?: Yes      Need for Family Participation in Patient Care: No (Comment) Care giver support system in place?: Yes (comment)   Criminal Activity/Legal Involvement Pertinent to Current Situation/Hospitalization: No - Comment as needed  Activities of Daily Living Home Assistive Devices/Equipment: Hearing aid, Eyeglasses, Walker (specify type) ADL Screening (condition at time of admission) Patient's cognitive ability adequate to  safely complete daily activities?: Yes Is the patient deaf or have difficulty hearing?: Yes Does the patient have difficulty seeing, even when wearing glasses/contacts?: No Does the patient have difficulty concentrating, remembering, or making decisions?: No Patient able to express need for assistance with ADLs?: Yes Does the patient have difficulty dressing or bathing?: Yes Independently performs ADLs?: Yes (appropriate for developmental age) Communication: Independent Dressing (OT): Needs assistance Grooming: Needs assistance Is this a change from baseline?: Change from baseline, expected to last >3 days Feeding: Independent Bathing: Needs assistance In/Out Bed: Needs assistance Does the patient have difficulty walking or climbing stairs?: Yes Weakness of Legs: None Weakness of Arms/Hands: Right  Permission Sought/Granted                  Emotional Assessment Appearance:: Appears stated age Attitude/Demeanor/Rapport: Engaged Affect (typically observed): Accepting Orientation: : Oriented to Self, Oriented to Place, Oriented to  Time, Oriented to Situation Alcohol / Substance Use: Never Used Psych Involvement: No (comment)  Admission diagnosis:  RIGHT SHOULDER PROXIMAL HUMERUS FRACTURE Patient Active Problem List   Diagnosis Date Noted  . Status post reverse total shoulder replacement, right 09/24/2018  . S/P right colectomy 01/26/2018  . Adenomatous polyp of ascending colon 11/24/2017  . Generalized anxiety disorder 09/06/2017  . Irritable bowel syndrome (IBS) 09/06/2017  . Fracture of one rib, right  side, subsequent encounter for fracture with routine healing 03/08/2017  . Orthostasis 06/05/2016  . Left shoulder pain 10/13/2015  . Constipation 05/30/2015  . Osteoporosis, postmenopausal 05/30/2015  . Compression fracture of L2 lumbar vertebra with delayed healing 05/29/2015  . Frequent falls 03/26/2015  . Atherosclerosis of arteries 03/26/2015  . Chest pain  02/09/2015  . Breast cancer screening 10/01/2014  . Family history of colon cancer 09/28/2014  . Viral URI with cough 06/12/2014  . Tenesmus (rectal) 06/12/2014  . TMJ (temporomandibular joint syndrome) 06/12/2014  . Varicose veins of both lower extremities without ulcer or inflammation 06/12/2014  . B12 deficiency 09/20/2013  . Fatigue 05/23/2013  . Internal hemorrhoids without complication 36/10/7701  . Insomnia 03/03/2013  . Vertigo due to cerebrovascular disease 03/03/2013  . Seronegative rheumatoid arthritis affecting lower leg ([place]) 03/03/2013   PCP:  Dion Body, MD Pharmacy:   [place], [place] - [address] [address] Phone: [phone] Fax: [phone]     Social Determinants of Health (SDOH) Interventions    Readmission Risk Interventions No flowsheet data found.

## 2018-09-24 NOTE — Anesthesia Post-op Follow-up Note (Signed)
Anesthesia QCDR form completed.        

## 2018-09-24 NOTE — Anesthesia Procedure Notes (Signed)
Anesthesia Regional Block: Interscalene brachial plexus block   Pre-Anesthetic Checklist: ,, timeout performed, Correct Patient, Correct Site, Correct Laterality, Correct Procedure, Correct Position, site marked, Risks and benefits discussed,  Surgical consent,  Pre-op evaluation,  At surgeon's request and post-op pain management  Laterality: Left  Prep: chloraprep       Needles:  Injection technique: Single-shot  Needle Type: Echogenic Stimulator Needle     Needle Length: 10cm  Needle Gauge: 20     Additional Needles:   Procedures:, nerve stimulator,,, ultrasound used (permanent image in chart),,,,   Nerve Stimulator or Paresthesia:  Response: biceps flexion,   Additional Responses:   Narrative:  Start time: 09/24/2018 7:40 AM Injection made incrementally with aspirations every 5 mL.  Performed by: Personally  Anesthesiologist: Molli Barrows, MD  Additional Notes: Functioning IV was confirmed and monitors were applied.  Sterile prep and drape,hand hygiene and sterile gloves were used.  Negative aspiration and negative test dose prior to incremental administration of local anesthetic. The patient tolerated the procedure well.

## 2018-09-24 NOTE — Transfer of Care (Signed)
Immediate Anesthesia Transfer of Care Note  Patient: Teresa Lin  Procedure(s) Performed: TOTAL SHOULDER ARTHROPLASTY - REVERSE RIGHT (Right )  Patient Location: PACU  Anesthesia Type:GA combined with regional for post-op pain  Level of Consciousness: sedated  Airway & Oxygen Therapy: Patient Spontanous Breathing and Patient connected to face mask oxygen  Post-op Assessment: Report given to RN and Post -op Vital signs reviewed and stable  Post vital signs: Reviewed  Last Vitals:  Vitals Value Taken Time  BP 99/43 09/24/2018 11:57 AM  Temp 36.6 C 09/24/2018 11:57 AM  Pulse 83 09/24/2018 11:57 AM  Resp 18 09/24/2018 11:57 AM  SpO2 100 % 09/24/2018 11:57 AM    Last Pain:  Vitals:   09/24/18 0804  TempSrc:   PainSc: Asleep         Complications: No apparent anesthesia complications

## 2018-09-24 NOTE — Progress Notes (Signed)
Patient has abrasion to left shin. It is scabbed over but is red around it. Dr Roland Rack aware. Ok to continue with surgery.

## 2018-09-24 NOTE — Anesthesia Postprocedure Evaluation (Signed)
Anesthesia Post Note  Patient: Teresa Lin  Procedure(s) Performed: TOTAL SHOULDER ARTHROPLASTY - REVERSE RIGHT (Right )  Patient location during evaluation: PACU Anesthesia Type: General Level of consciousness: awake and alert Pain management: pain level controlled Vital Signs Assessment: post-procedure vital signs reviewed and stable Respiratory status: spontaneous breathing, nonlabored ventilation, respiratory function stable and patient connected to nasal cannula oxygen Cardiovascular status: blood pressure returned to baseline and stable Postop Assessment: no apparent nausea or vomiting Anesthetic complications: no     Last Vitals:  Vitals:   09/24/18 1212 09/24/18 1227  BP: (!) 111/49 (!) 106/44  Pulse: 85 85  Resp: 20 16  Temp:    SpO2: 100% 97%    Last Pain:  Vitals:   09/24/18 1227  TempSrc:   PainSc: Asleep                 Precious Haws Piscitello

## 2018-09-24 NOTE — Discharge Instructions (Signed)
Diet: As you were doing prior to hospitalization   Shower:  May shower but keep the wounds dry, use an occlusive plastic wrap, NO SOAKING IN TUB.  If the bandage gets wet, change with a clean dry gauze.  Dressing:  You may change your dressing as needed. Change the dressing with sterile gauze dressing.    Activity:  Increase activity slowly as tolerated, but follow the weight bearing instructions below.  No lifting or driving for 6 weeks.  Weight Bearing:   Non-weightbearing to the right upper extremity.  Blood Clot Prevention: Take 1 325mg  aspirin daily for the first 14 days following surgery.  To prevent constipation: you may use a stool softener such as -  Colace (over the counter) 100 mg by mouth twice a day  Drink plenty of fluids (prune juice may be helpful) and high fiber foods Miralax (over the counter) for constipation as needed.    Itching:  If you experience itching with your medications, try taking only a single pain pill, or even half a pain pill at a time.  You may take up to 10 pain pills per day, and you can also use benadryl over the counter for itching or also to help with sleep.   Precautions:  If you experience chest pain or shortness of breath - call 911 immediately for transfer to the hospital emergency department!!  If you develop a fever greater that 101 F, purulent drainage from wound, increased redness or drainage from wound, or calf pain-Call [place]                                              Follow- Up Appointment:  Please call for an appointment to be seen in 2 weeks at [facility]

## 2018-09-24 NOTE — Op Note (Signed)
09/24/2018  11:34 AM  Patient:   Teresa Lin  Pre-Op Diagnosis:   Closed displaced three-part proximal humerus fracture, right shoulder.  Post-Op Diagnosis:   Same.  Procedure:   Reverse right total shoulder arthroplasty.  Surgeon:   Pascal Lux, MD  Assistant:   Cameron Proud, PA-C  Anesthesia:   General endotracheal with an interscalene block using Exparel placed preoperatively by the anesthesiologist.  Findings:   As above.  Complications:   None  EBL:   150 cc  Fluids:   600 cc crystalloid  UOP:   None  TT:   None  Drains:   None  Closure:   Staples  Implants:   All press-fit Integra system with a 13 mm stem, a small metaphyseal body, a +0 mm humeral platform, a mini baseplate, and a 38 mm concentric 2 mm laterally offset glenosphere.  Brief Clinical Note:   The patient is an 85 year old female who sustained the above-noted injury 1 week ago. The patient has been cleared medically and presents at this time for a reverse right total shoulder arthroplasty.  Procedure:   The patient underwent placement of an interscalene block using Exparel by the anesthesiologist in the preoperative holding area before being brought into the operating room and lain in the supine position. The patient then underwent general endotracheal intubation and anesthesia before the patient was repositioned in the beach chair position using the beach chair positioner. The right shoulder and upper extremity were prepped with ChloraPrep solution before being draped sterilely. Preoperative antibiotics were administered. A standard anterior approach to the shoulder was made through an approximately 4-5 inch incision. The incision was carried down through the subcutaneous tissues to expose the deltopectoral fascia. The interval between the deltoid and pectoralis muscles was identified and this plane developed, retracting the cephalic vein laterally with the deltoid muscle. The cephalic vein developed a  leak and therefore was tied off proximally and distally using 2-0 Vicryl interrupted sutures. The conjoined tendon was identified. Its lateral margin was dissected and the Kolbel self-retraining retractor inserted.   The subacromial space was developed and bursal tissues were removed anteriorly to improve visualization. The subscapularis tendon was tagged to just medial to the lesser tuberosity using a #2 FiberWire. The supraspinatus/infraspinatus tendon also was tagged just proximal to the greater tuberosity using a second #2 FiberWire. The biceps tendon was identified and a soft tissue tenodesis performed where the biceps distended distally behind the pectoralis major tendon using two #0 Ethibond interrupted sutures. The biceps tendon was followed proximally, releasing the fascia overlying the bicipital groove and extending this into the rotator interval. An osteotome was placed in the groove and the lesser tuberosity osteotomized, permitting access into the humeral head. This fragment was then removed using a towel clamp.   Attention was redirected to the glenoid. The labrum was debrided circumferentially and the biceps tendon detached from its attachment to the glenoid before the center of the glenoid was identified. The guidewire was drilled into the glenoid neck using the appropriate guide. After verifying its position, it was overreamed with the mini-baseplate reamer to create a flat surface before the stem reamer was utilized. The superior and inferior peg sites were reamed using the appropriate guide to complete the glenoid preparation. The permanent mini-baseplate was impacted into place. It was stabilized with a 25 x 4.5 mm central screw and four peripheral screws. Locking caps were placed over the superior and inferior screws. The permanent 38 mm concentric glenosphere with +2  mm of lateral offset was then impacted into place and its Morse taper locking mechanism verified using manual  distraction.  Attention was directed to the humeral side. The humeral canal was prepared utilizing the tapered stem reamers sequentially beginning with the 7 mm stem and progressing to a 13 mm stem. This demonstrated a good tight fit. The trial stem and small metaphyseal body were put together on the back table and a trial reduction performed using the +0 mm insert. With the +0 mm insert, the arm demonstrated excellent range of motion as the hand could be brought across the chest to the opposite shoulder and brought to the top of the patient's head and to the patient's ear. The shoulder appeared stable throughout this range of motion. The joint was dislocated and the trial components removed. The permanent 13 mm stem with the small body was impacted into place with care taken to maintain the appropriate version (20 degrees retroversion). A repeat trial reduction with the +0 mm insert again demonstrated excellent stability with the findings as described above. Therefore, the shoulder was re-dislocated and, after inserting the locking screw to secure the body to the stem, the permanent +0 mm insert impacted into place. After verifying its locking mechanism, the shoulder was relocated using two finger pressure and again placed through a range of motion with the findings as described above.  Of note, two #2 FiberWire's were placed through drill holes in the lateral cortical shaft of the humerus prior to insertion of the permanent stem to assist with reduction and stabilization of the tuberosities.  The wound was copiously irrigated with bacitracin saline solution using the jet lavage system before a total of 30 cc of 0.5% Sensorcaine with epinephrine was injected into the pericapsular and peri-incisional tissues to help with postoperative analgesia. The greater and lesser tuberosities were reapproximated using multiple #2 FiberWire interrupted sutures. The deltopectoral interval was closed using #0 Vicryl  interrupted sutures before the subcutaneous tissues were closed using 2-0 Vicryl interrupted sutures. The skin was closed using staples. Prior to closing the skin, 1 g of transexemic acid in 10 cc of normal saline was injected intra-articularly to help with postoperative bleeding. A sterile occlusive dressing was applied to the wound before the arm was placed into a shoulder immobilizer with an abduction pillow. A Polar Care system also was applied to the shoulder. The patient was then transferred back to a hospital bed before being awakened, extubated, and returned to the recovery room in satisfactory condition after tolerating the procedure well.

## 2018-09-24 NOTE — Anesthesia Procedure Notes (Signed)
Procedure Name: Intubation Performed by: Rolla Plate, CRNA Pre-anesthesia Checklist: Patient identified, Patient being monitored, Timeout performed, Emergency Drugs available and Suction available Patient Re-evaluated:Patient Re-evaluated prior to induction Oxygen Delivery Method: Circle system utilized Preoxygenation: Pre-oxygenation with 100% oxygen Induction Type: IV induction Ventilation: Mask ventilation without difficulty Laryngoscope Size: Miller and 2 Grade View: Grade II Tube type: Oral Tube size: 7.0 mm Number of attempts: 1 Airway Equipment and Method: Stylet Placement Confirmation: ETT inserted through vocal cords under direct vision,  positive ETCO2 and breath sounds checked- equal and bilateral Secured at: 21 cm Tube secured with: Tape Dental Injury: Teeth and Oropharynx as per pre-operative assessment

## 2018-09-25 LAB — CBC WITH DIFFERENTIAL/PLATELET
Abs Immature Granulocytes: 0.04 10*3/uL (ref 0.00–0.07)
Basophils Absolute: 0 10*3/uL (ref 0.0–0.1)
Basophils Relative: 0 %
Eosinophils Absolute: 0 10*3/uL (ref 0.0–0.5)
Eosinophils Relative: 0 %
HCT: 33 % — ABNORMAL LOW (ref 36.0–46.0)
Hemoglobin: 10.8 g/dL — ABNORMAL LOW (ref 12.0–15.0)
Immature Granulocytes: 0 %
Lymphocytes Relative: 13 %
Lymphs Abs: 1.3 10*3/uL (ref 0.7–4.0)
MCH: 32 pg (ref 26.0–34.0)
MCHC: 32.7 g/dL (ref 30.0–36.0)
MCV: 97.9 fL (ref 80.0–100.0)
Monocytes Absolute: 0.9 10*3/uL (ref 0.1–1.0)
Monocytes Relative: 9 %
Neutro Abs: 8.1 10*3/uL — ABNORMAL HIGH (ref 1.7–7.7)
Neutrophils Relative %: 78 %
Platelets: 280 10*3/uL (ref 150–400)
RBC: 3.37 MIL/uL — ABNORMAL LOW (ref 3.87–5.11)
RDW: 13.1 % (ref 11.5–15.5)
WBC: 10.4 10*3/uL (ref 4.0–10.5)
nRBC: 0 % (ref 0.0–0.2)

## 2018-09-25 LAB — BASIC METABOLIC PANEL
Anion gap: 7 (ref 5–15)
BUN: 14 mg/dL (ref 8–23)
CO2: 27 mmol/L (ref 22–32)
Calcium: 8.1 mg/dL — ABNORMAL LOW (ref 8.9–10.3)
Chloride: 107 mmol/L (ref 98–111)
Creatinine, Ser: 0.44 mg/dL (ref 0.44–1.00)
GFR calc Af Amer: >60 mL/min (ref >60)
GFR calc non Af Amer: >60 mL/min (ref >60)
Glucose, Bld: 89 mg/dL (ref 70–99)
Potassium: 3.5 mmol/L (ref 3.5–5.1)
Sodium: 141 mmol/L (ref 135–145)

## 2018-09-25 LAB — SURGICAL PATHOLOGY

## 2018-09-25 MED ORDER — CEFAZOLIN SODIUM-DEXTROSE 2-4 GM/100ML-% IV SOLN
2.0000 g | Freq: Once | INTRAVENOUS | Status: AC
  Administered 2018-09-25: 04:00:00 2 g via INTRAVENOUS
  Filled 2018-09-25: qty 100

## 2018-09-25 NOTE — Progress Notes (Signed)
Patient is being discharged to home this afternoon. Patient wants to wait to go home until Dr. Roland Rack makes his rounds this afternoon. DC & Rx instructions given and patient acknowledged understanding.  Answered patient's questions. IV removed.

## 2018-09-25 NOTE — TOC Transition Note (Addendum)
Transition of Care [facility]) - CM/SW Discharge Note   Patient Details  Name: KARI MONTERO MRN: [id number] Date of Birth: 1931/06/23  Transition of Care [facility] CM/SW Contact:  Su Hilt, RN Phone Number: 09/25/2018, 9:18 AM   Clinical Narrative:    Patient to discharge home today with Son providing transportation, she has no DME needs and she has been set up with [facility] PT and OT and aide as recommended from PT and POT today she has a Charity fundraiser that comes daily, she was provided with resource list for additional self pay sitters if needed, has strong family support and can afford medications     Barriers to Discharge: Continued Medical Work up   Patient Goals and CMS Choice Patient states their goals for this hospitalization and ongoing recovery are:: go home      Discharge Placement                       Discharge Plan and Services   Discharge Planning Services: CM Consult                                 Social Determinants of Health (SDOH) Interventions     Readmission Risk Interventions No flowsheet data found.

## 2018-09-25 NOTE — Evaluation (Signed)
Occupational Therapy Evaluation Patient Details Name: Teresa Lin MRN: [id number] DOB: Mar 24, 1932 Today's Date: 09/25/2018    History of Present Illness 85 y/o female s/p R reverse total shoulder replacement 5/14.     Clinical Impression   Patient was seen for an OT evaluation this date. Pt lives by herself and was active and independent prior to surgery, enjoying gardening/yardwork. Pt plans to have son/daughter assist for a few days upon discharge and reports she has neighbors who she can call to assist PRN. Pt has orders for RUE to be immobilized and will be NWBing per MD. Patient presents with impaired strength/ROM and pain in RUE. These impairments result in a decreased ability to perform self care tasks requiring min-mod assist for UB/LB dressing and bathing and max assist for application of polar care and sling/immobilizer. Pt instructed in polar care mgt, sling/immobilizer mgt, ROM exercises for RUE, RUE precautions, adaptive strategies for bathing/dressing/toileting/grooming, positioning and considerations for sleep, and home/routines modifications to maximize falls prevention, safety, and independence. Handout provided. OT adjusted sling/immobilizer and polar care to improve comfort, optimize positioning, and to maximize skin integrity/safety. Pt verbalized understanding of all education/training provided. Pt will benefit from skilled OT services to address these limitations and improve independence in daily tasks as well as support recall and carryover of learned instructions.   Pt endorses that she is surprised at home extensive the precautions and the recovery process. Pt states she believed that she would be back to driving and yard work in 2 weeks or less and was not expecting to have to wear a sling/immobilizer 24/7. Pt provided with additional instruction and encouragement to seek assistance from her support system. Strongly recommend [place] services to continue therapy to maximize return  to PLOF, address home/routines modifications and safety, minimize falls risk, and minimize caregiver burden. Pt would also benefit from an [facility] aide to support recovery process and safety. RNCM notified of recommendations.     Follow Up Recommendations  Home health OT;Supervision - Intermittent;Other (comment)(would also benefit from [facility] aide)    Equipment Recommendations  None recommended by OT    Recommendations for Other Services       Precautions / Restrictions Precautions Precautions: Shoulder;Fall Type of Shoulder Precautions: reverse total shoulder Shoulder Interventions: Shoulder sling/immobilizer;Shoulder abduction pillow;At all times;Off for dressing/bathing/exercises Precaution Booklet Issued: Yes (comment) Restrictions Weight Bearing Restrictions: Yes RUE Weight Bearing: Non weight bearing      Mobility Bed Mobility               General bed mobility comments: deferred, up in recliner at start and end of session  Transfers Overall transfer level: Independent Equipment used: None                  Balance Overall balance assessment: Independent                                         ADL either performed or assessed with clinical judgement   ADL Overall ADL's : Needs assistance/impaired Eating/Feeding: Sitting;Modified independent Eating/Feeding Details (indicate cue type and reason): using non-dom hand Grooming: Sitting;Modified independent Grooming Details (indicate cue type and reason): using non-dom hand Upper Body Bathing: Sitting;Minimal assistance Upper Body Bathing Details (indicate cue type and reason): using non-dom hand Lower Body Bathing: Sit to/from stand;Minimal assistance Lower Body Bathing Details (indicate cue type and reason): using non-dom hand Upper Body Dressing :  Sitting;Minimal assistance;Moderate assistance Upper Body Dressing Details (indicate cue type and reason): using non-dom hand Lower Body Dressing:  Sit to/from stand;Minimal assistance   Toilet Transfer: Regular Toilet;Ambulation;Independent   Toileting- Clothing Manipulation and Hygiene: Modified independent Toileting - Clothing Manipulation Details (indicate cue type and reason): using non-dom hand             Vision Baseline Vision/History: Wears glasses Wears Glasses: Reading only Patient Visual Report: No change from baseline Vision Assessment?: No apparent visual deficits     Perception     Praxis      Pertinent Vitals/Pain Pain Assessment: 0-10 Pain Score: 4  Pain Location: R shoulder Pain Descriptors / Indicators: Aching Pain Intervention(s): Limited activity within patient's tolerance;Monitored during session;Repositioned;Ice applied     Hand Dominance Right   Extremity/Trunk Assessment Upper Extremity Assessment Upper Extremity Assessment: RUE deficits/detail(LUE WFL) RUE Deficits / Details: sensation intact, full ROM for wrist, elbow, and hand; shoulder ROM deferred RUE: Unable to fully assess due to immobilization;Unable to fully assess due to pain RUE Sensation: WNL RUE Coordination: decreased gross motor   Lower Extremity Assessment Lower Extremity Assessment: Overall WFL for tasks assessed   Cervical / Trunk Assessment Cervical / Trunk Assessment: Normal   Communication Communication Communication: No difficulties;HOH(with hearing aides, does well)   Cognition Arousal/Alertness: Awake/alert Behavior During Therapy: WFL for tasks assessed/performed Overall Cognitive Status: Within Functional Limits for tasks assessed                                 General Comments: appears a bit bewildered at how extensive the recovery process will be - would benefit from additional instruction to support recall and carryover   General Comments  shoulder sling/polar care adjusted for optimal positioning and skin protection, pt endorses improved comfort    Exercises Other Exercises Other  Exercises: pt instructed in comprehensive shoulder instructions including precautions, positioning for sleeping, sling/immobilizer and polar care mgt, adaptive strategies for ADL tasks, and ther-ex; handout provided   Shoulder Instructions      Home Living Family/patient expects to be discharged to:: Private residence Living Arrangements: Alone Available Help at Discharge: Available PRN/intermittently(children 24/7 for first week then neighbors?) Type of Home: House Home Access: Stairs to enter [employer] of Steps: 1 Entrance Stairs-Rails: None       Bathroom Shower/Tub: Tub/shower unit;Walk-in shower(has both, plans to use walk in)   [employer]: Standard     Home Equipment: Environmental consultant - 2 wheels;Other (comment);Bedside commode   Additional Comments: has a shower stool without a back rest      Prior Functioning/Environment Level of Independence: Independent        Comments: Pt drives, runs all her errands, works in yard, Programmer, systems Problem List: Decreased strength;Decreased range of motion;Decreased knowledge of precautions;Decreased knowledge of use of DME or AE;Impaired UE functional use;Pain;Decreased safety awareness      OT Treatment/Interventions: Self-care/ADL training;Therapeutic exercise;Therapeutic activities;DME and/or AE instruction;Patient/family education;Manual therapy    OT Goals(Current goals can be found in the care plan section) Acute Rehab OT Goals Patient Stated Goal: to get back to driving and gardening OT Goal Formulation: With patient Time For Goal Achievement: 10/09/18 Potential to Achieve Goals: Good ADL Goals Pt Will Perform Upper Body Dressing: sitting;with caregiver independent in assisting Pt Will Perform Lower Body Dressing: sit to/from stand;with caregiver independent in assisting Additional ADL Goal #1: Pt will  verbalize 100% of her shoulder precautions and how to maintain them for ADL tasks with occasional verbal cues  for safety. Additional ADL Goal #2: Pt will independently instruct family/caregiver in shoulder sling/immobilizer mgt including donning/doffing, wear schedule, and positioning. Additional ADL Goal #3: Pt will independently instruct family/caregiver in polar care mgt including donning/doffing, wear schedule, and positioning.  OT Frequency: Min 2X/week   Barriers to D/C: Decreased caregiver support          Co-evaluation              AM-PAC OT "6 Clicks" Daily Activity     Outcome Measure Help from another person eating meals?: None Help from another person taking care of personal grooming?: None Help from another person toileting, which includes using toliet, bedpan, or urinal?: None Help from another person bathing (including washing, rinsing, drying)?: A Little Help from another person to put on and taking off regular upper body clothing?: A Lot Help from another person to put on and taking off regular lower body clothing?: A Little 6 Click Score: 20   End of Session    Activity Tolerance: Patient tolerated treatment well Patient left: in chair;with call bell/phone within reach;with chair alarm set  OT Visit Diagnosis: Other abnormalities of gait and mobility (R26.89);Pain Pain - Right/Left: Right Pain - part of body: Shoulder                Time: 8453-6468 OT Time Calculation (min): 42 min Charges:  OT General Charges $OT Visit: 1 Visit OT Evaluation $OT Eval Moderate Complexity: 1 Mod OT Treatments $Self Care/Home Management : 8-22 mins $Therapeutic Activity: 8-22 mins  Jeni Salles, MPH, MS, OTR/L ascom [phone] 09/25/18, 9:41 AM

## 2018-09-25 NOTE — Progress Notes (Signed)
Physical Therapy Treatment Patient Details Name: Teresa Lin MRN: [id number] DOB: 1931/06/05 Today's Date: 09/25/2018    History of Present Illness 85 y/o female s/p R reverse total shoulder replacement 5/14.      PT Comments    Pt continues to demonstrate ind and safety with basic transfers and ambulation. Patient requires guarding over flight of stairs for safety with 1 instances of LOB requiring minA to remain upright. Patient only has 1 step to enter home and does demonstrate safety negotiating 1 step at a time. Patient is able to demonstrate all therex with accuracy within precautions following PT demo, and cuing. Patient given handout of therex performed with pictures. Would benefit from skilled PT to address above deficits and promote optimal return to PLOF    Follow Up Recommendations  Home health PT;Follow surgeon's recommendation for DC plan and follow-up therapies;Supervision - Intermittent     Equipment Recommendations       Recommendations for Other Services       Precautions / Restrictions Precautions Precautions: Shoulder;Fall Type of Shoulder Precautions: reverse total shoulder Shoulder Interventions: Shoulder sling/immobilizer;Shoulder abduction pillow;At all times;Off for dressing/bathing/exercises Precaution Booklet Issued: Yes (comment) Restrictions Weight Bearing Restrictions: Yes RUE Weight Bearing: Non weight bearing    Mobility  Bed Mobility Overal bed mobility: Needs Assistance Bed Mobility: Supine to Sit     Supine to sit: Min assist     General bed mobility comments: deferred, up in recliner at start and end of session  Transfers Overall transfer level: Independent Equipment used: None             General transfer comment: Pt easily gets to standing w/o assist, able to maintain balance w/o issue  Ambulation/Gait Ambulation/Gait assistance: Independent Gait Distance (Feet): 500 Feet Assistive device: None       General Gait  Details: Pt able to ambulate at community appropriate speed and safety.  She had no fatigue or hesitation t/o the effort.   Stairs Stairs: Yes Stairs assistance: Min assist;Min guard Stair Management: No rails Number of Stairs: 20 General stair comments: 2 trials of full flight of steps without handrail with 1 instance of LOB requiring minA to regain for safety   Wheelchair Mobility    Modified Rankin (Stroke Patients Only)       Balance Overall balance assessment: Mild deficits observed, not formally tested                                          Cognition Arousal/Alertness: Awake/alert Behavior During Therapy: WFL for tasks assessed/performed Overall Cognitive Status: Within Functional Limits for tasks assessed                                 General Comments: appears a bit bewildered at how extensive the recovery process will be - would benefit from additional instruction to support recall and carryover      Exercises General Exercises - Upper Extremity Wrist Flexion: AROM;10 reps Wrist Extension: AROM;10 reps Other Exercises Other Exercises: digit full flexion to ext/abd x10; scapular retraction with TC and focus on without shoulder ext x10; UT stretch with cervical sidebending 20sec hold, ball squeezes x20 Handout given of therex  Other Exercises: Ambulation over community distances with no LOB. Ambulation over 1 flight of stairs x2 trials with 1 instance of LOB with  reciprocol step ascent requiring minA to regain balance    General Comments General comments (skin integrity, edema, etc.): with reciprocol gait ascent 1 instance of LOB       Pertinent Vitals/Pain Pain Assessment: 0-10 Pain Score: 2  Pain Location: R shoulder Pain Descriptors / Indicators: Aching Pain Intervention(s): Limited activity within patient's tolerance;Monitored during session;Repositioned;Ice applied    Home Living Family/patient expects to be discharged  to:: Private residence Living Arrangements: Alone Available Help at Discharge: Available PRN/intermittently(children 24/7 for first week then neighbors?) Type of Home: House Home Access: Stairs to enter Entrance Stairs-Rails: None   Home Equipment: Environmental consultant - 2 wheels;Other (comment);Bedside commode Additional Comments: has a shower stool without a back rest    Prior Function Level of Independence: Independent      Comments: Pt drives, runs all her errands, works in yard, etc    PT Goals (current goals can now be found in the care plan section) Acute Rehab PT Goals Patient Stated Goal: to get back to driving and gardening PT Goal Formulation: With patient Time For Goal Achievement: 10/08/18 Potential to Achieve Goals: Good    Frequency    BID      PT Plan      Co-evaluation              AM-PAC PT "6 Clicks" Mobility   Outcome Measure  Help needed turning from your back to your side while in a flat bed without using bedrails?: A Little Help needed moving from lying on your back to sitting on the side of a flat bed without using bedrails?: A Little Help needed moving to and from a bed to a chair (including a wheelchair)?: None Help needed standing up from a chair using your arms (e.g., wheelchair or bedside chair)?: None Help needed to walk in hospital room?: None Help needed climbing 3-5 steps with a railing? : A Little 6 Click Score: 21    End of Session Equipment Utilized During Treatment: Gait belt Activity Tolerance: Patient tolerated treatment well Patient left: with chair alarm set;with call bell/phone within reach   PT Visit Diagnosis: Muscle weakness (generalized) (M62.81);Pain Pain - Right/Left: Right Pain - part of body: Shoulder     Time: 7371-0626 PT Time Calculation (min) (ACUTE ONLY): 30 min  Charges:  $Therapeutic Exercise: 8-22 mins $Therapeutic Activity: 8-22 mins                     Shelton Silvas PT, DPT   Shelton Silvas 09/25/2018, 10:28 AM

## 2018-09-25 NOTE — Progress Notes (Signed)
  Subjective: 1 Day Post-Op Procedure(s) (LRB): TOTAL SHOULDER ARTHROPLASTY - REVERSE RIGHT (Right) Patient reports pain as mild, she recently received some pain medication. Patient is well, and has had no acute complaints or problems Plan is to go Home after hospital stay. Negative for chest pain and shortness of breath Fever: no Gastrointestinal:Negative for nausea and vomiting  Objective: Vital signs in last 24 hours: Temp:  [97.5 F (36.4 C)-99 F (37.2 C)] 97.6 F (36.4 C) (05/15 0334) Pulse Rate:  [76-94] 78 (05/15 0334) Resp:  [15-24] 20 (05/15 0334) BP: (99-140)/(40-70) 140/62 (05/15 0334) SpO2:  [92 %-100 %] 97 % (05/15 0334) Weight:  [64 kg] 64 kg (05/14 0720)  Intake/Output from previous day:  Intake/Output Summary (Last 24 hours) at 09/25/2018 0656 Last data filed at 09/24/2018 1640 Gross per 24 hour  Intake 1540.1 ml  Output 150 ml  Net 1390.1 ml    Intake/Output this shift: No intake/output data recorded.  Labs: Recent Labs    09/22/18 1025 09/25/18 0605  HGB 12.1 10.8*   Recent Labs    09/22/18 1025 09/25/18 0605  WBC 8.4 10.4  RBC 3.80* 3.37*  HCT 37.6 33.0*  PLT 295 280   Recent Labs    09/22/18 1025  NA 138  K 4.6  CL 102  CO2 28  BUN 17  CREATININE 0.42*  GLUCOSE 93  CALCIUM 8.8*   No results for input(s): LABPT, INR in the last 72 hours.   EXAM General - Patient is Alert, Appropriate and Oriented Extremity - ABD soft Incision: dressing C/D/I No cellulitis present  Decreased sensation to light touch to the right arm this morning.  Appears block is still working to th right arm. Dressing/Incision - clean, dry, no drainage Motor Function - intact, moving foot and toes well on exam.  Abdomen soft with normal BS this AM.  Past Medical History:  Diagnosis Date  . Anemia   . Anxiety   . Arthritis    osteoarthritis. Bilateral knee replacement, hands, meniscal tear, cervical disc disease  . Atherosclerosis   . Atypical chest  pain   . Collagen vascular disease ([place])   . Colon polyp 12/15/2017  . Colon polyps   . Compression fracture of L2 ([place])   . Diverticulosis   . DVT (deep venous thrombosis) ([place])   . GERD (gastroesophageal reflux disease)   . History of seronegative inflammatory arthritis   . Hypertension   . IBS (irritable bowel syndrome)   . Internal hemorrhoids   . Phlebitis and thrombophlebitis of deep veins of lower extremities ([facility])    after surgery  . Vision abnormalities     Assessment/Plan: 1 Day Post-Op Procedure(s) (LRB): TOTAL SHOULDER ARTHROPLASTY - REVERSE RIGHT (Right) Active Problems:   Status post reverse total shoulder replacement, right  Estimated body mass index is 22.42 kg/m as calculated from the following:   Height as of this encounter: 5' 6.5" (1.689 m).   Weight as of this encounter: 64 kg. Advance diet Up with therapy D/C IV fluids  When tolerating po intake.  Labs reviewed this AM. Up with therapy today. Patient is passing gas without pain. Plan for discharge home today pending progress with PT.  DVT Prophylaxis - Lovenox, Foot Pumps and TED hose Non-weightbearing to the right upper extremity.  Raquel , PA-C [facility] 09/25/2018, 6:56 AM

## 2018-09-26 DIAGNOSIS — Z9181 History of falling: Secondary | ICD-10-CM | POA: Diagnosis not present

## 2018-09-26 DIAGNOSIS — Z86010 Personal history of colonic polyps: Secondary | ICD-10-CM | POA: Diagnosis not present

## 2018-09-26 DIAGNOSIS — Z86718 Personal history of other venous thrombosis and embolism: Secondary | ICD-10-CM | POA: Diagnosis not present

## 2018-09-26 DIAGNOSIS — K219 Gastro-esophageal reflux disease without esophagitis: Secondary | ICD-10-CM | POA: Diagnosis not present

## 2018-09-26 DIAGNOSIS — I10 Essential (primary) hypertension: Secondary | ICD-10-CM | POA: Diagnosis not present

## 2018-09-26 DIAGNOSIS — M5030 Other cervical disc degeneration, unspecified cervical region: Secondary | ICD-10-CM | POA: Diagnosis not present

## 2018-09-26 DIAGNOSIS — M80021D Age-related osteoporosis with current pathological fracture, right humerus, subsequent encounter for fracture with routine healing: Secondary | ICD-10-CM | POA: Diagnosis not present

## 2018-09-26 DIAGNOSIS — F411 Generalized anxiety disorder: Secondary | ICD-10-CM | POA: Diagnosis not present

## 2018-09-26 DIAGNOSIS — M0600 Rheumatoid arthritis without rheumatoid factor, unspecified site: Secondary | ICD-10-CM | POA: Diagnosis not present

## 2018-09-26 DIAGNOSIS — Z96611 Presence of right artificial shoulder joint: Secondary | ICD-10-CM | POA: Diagnosis not present

## 2018-09-28 ENCOUNTER — Other Ambulatory Visit: Payer: Self-pay | Admitting: *Deleted

## 2018-09-28 DIAGNOSIS — M0600 Rheumatoid arthritis without rheumatoid factor, unspecified site: Secondary | ICD-10-CM | POA: Diagnosis not present

## 2018-09-28 DIAGNOSIS — M80021D Age-related osteoporosis with current pathological fracture, right humerus, subsequent encounter for fracture with routine healing: Secondary | ICD-10-CM | POA: Diagnosis not present

## 2018-09-28 DIAGNOSIS — F411 Generalized anxiety disorder: Secondary | ICD-10-CM | POA: Diagnosis not present

## 2018-09-28 DIAGNOSIS — I10 Essential (primary) hypertension: Secondary | ICD-10-CM | POA: Diagnosis not present

## 2018-09-28 DIAGNOSIS — K219 Gastro-esophageal reflux disease without esophagitis: Secondary | ICD-10-CM | POA: Diagnosis not present

## 2018-09-28 DIAGNOSIS — M5030 Other cervical disc degeneration, unspecified cervical region: Secondary | ICD-10-CM | POA: Diagnosis not present

## 2018-09-28 NOTE — Patient Outreach (Signed)
[facility]) Care Management  09/28/2018  ATLANTA PELTO 10-16-1931 [id number]   Subjective:  Per chart review, patient's designated party release ( 11/24/2017) is Santia Labate (patient's son).  Telephone call to patient's home, spoke with patient's son Pakou Rainbow), states he is patient's medical power of attorney, stated patient's name, date of birth, and address.   Discussed [facility] Care Management Medicare EMMI General Discharge Red Flag Alert follow up, son voiced understanding, and is in agreement to follow up on patient's behalf.  Son states patient is doing better, he receives patient receiving EMMI automated calls, answered calls on patient's behalf, patient currently has 24 hours care at home, receiving home health physical, and occupational therapy.   States therapies are going well, patient / families questions regarding sling have been answered, has follow up appointment with surgeon on 10/05/2018, and 10/16/2018. Son states patient is able to manage self care and has assistance as needed.  Son states patient does have discharge papers, aware of signs / symptoms to report, and how to reach MD if needed.   Son states patient does not have any education material, EMMI follow up, care coordination, care management, disease monitoring, transportation, community resource, or pharmacy needs at this time.  States he is very appreciative of the follow up.       Objective: Per KPN (Knowledge Performance Now, point of care tool) and chart review, patient hospitalized 09/24/2018 -09/25/2018 for closed, displaced three-part proximal humerus fracture of the right shoulder, status post reverse total shoulder replacement, right on 09/24/2018.   Patient also has a history of hypertension, DVT, IBS, Diverticulosis, Compression fracture of L2 , Collagen vascular disease, Adenomatous polyp of ascending colon, status post right colectomy, TMJ (temporomandibular joint syndrome), and  osteoarthritis.       Assessment:  Received Medicare EMMI General Discharge Red Alert Flag follow up referral on 09/28/2018.  Red Flag Alert triggers times 2, Day #1, patient answered no to the following questions: Got discharge papers? Know who to call about changes in condition?   EMMI follow up completed and no further care management needs.      Plan: RNCM will complete case closure due to follow up completed / no care management needs.       Darlene H. Annia Friendly, BSN, [place] [facility] Telephonic CM Phone: [phone] Fax: [phone]

## 2018-09-29 DIAGNOSIS — I10 Essential (primary) hypertension: Secondary | ICD-10-CM | POA: Diagnosis not present

## 2018-09-29 DIAGNOSIS — M0600 Rheumatoid arthritis without rheumatoid factor, unspecified site: Secondary | ICD-10-CM | POA: Diagnosis not present

## 2018-09-29 DIAGNOSIS — F411 Generalized anxiety disorder: Secondary | ICD-10-CM | POA: Diagnosis not present

## 2018-09-29 DIAGNOSIS — K219 Gastro-esophageal reflux disease without esophagitis: Secondary | ICD-10-CM | POA: Diagnosis not present

## 2018-09-29 DIAGNOSIS — M5030 Other cervical disc degeneration, unspecified cervical region: Secondary | ICD-10-CM | POA: Diagnosis not present

## 2018-09-29 DIAGNOSIS — M80021D Age-related osteoporosis with current pathological fracture, right humerus, subsequent encounter for fracture with routine healing: Secondary | ICD-10-CM | POA: Diagnosis not present

## 2018-09-30 DIAGNOSIS — F411 Generalized anxiety disorder: Secondary | ICD-10-CM | POA: Diagnosis not present

## 2018-09-30 DIAGNOSIS — I10 Essential (primary) hypertension: Secondary | ICD-10-CM | POA: Diagnosis not present

## 2018-09-30 DIAGNOSIS — M5030 Other cervical disc degeneration, unspecified cervical region: Secondary | ICD-10-CM | POA: Diagnosis not present

## 2018-09-30 DIAGNOSIS — K219 Gastro-esophageal reflux disease without esophagitis: Secondary | ICD-10-CM | POA: Diagnosis not present

## 2018-09-30 DIAGNOSIS — M0600 Rheumatoid arthritis without rheumatoid factor, unspecified site: Secondary | ICD-10-CM | POA: Diagnosis not present

## 2018-09-30 DIAGNOSIS — M80021D Age-related osteoporosis with current pathological fracture, right humerus, subsequent encounter for fracture with routine healing: Secondary | ICD-10-CM | POA: Diagnosis not present

## 2018-10-01 DIAGNOSIS — I10 Essential (primary) hypertension: Secondary | ICD-10-CM | POA: Diagnosis not present

## 2018-10-01 DIAGNOSIS — M5030 Other cervical disc degeneration, unspecified cervical region: Secondary | ICD-10-CM | POA: Diagnosis not present

## 2018-10-01 DIAGNOSIS — M80021D Age-related osteoporosis with current pathological fracture, right humerus, subsequent encounter for fracture with routine healing: Secondary | ICD-10-CM | POA: Diagnosis not present

## 2018-10-01 DIAGNOSIS — K219 Gastro-esophageal reflux disease without esophagitis: Secondary | ICD-10-CM | POA: Diagnosis not present

## 2018-10-01 DIAGNOSIS — M0600 Rheumatoid arthritis without rheumatoid factor, unspecified site: Secondary | ICD-10-CM | POA: Diagnosis not present

## 2018-10-01 DIAGNOSIS — F411 Generalized anxiety disorder: Secondary | ICD-10-CM | POA: Diagnosis not present

## 2018-10-02 DIAGNOSIS — M80021D Age-related osteoporosis with current pathological fracture, right humerus, subsequent encounter for fracture with routine healing: Secondary | ICD-10-CM | POA: Diagnosis not present

## 2018-10-02 DIAGNOSIS — K219 Gastro-esophageal reflux disease without esophagitis: Secondary | ICD-10-CM | POA: Diagnosis not present

## 2018-10-02 DIAGNOSIS — I10 Essential (primary) hypertension: Secondary | ICD-10-CM | POA: Diagnosis not present

## 2018-10-02 DIAGNOSIS — M5030 Other cervical disc degeneration, unspecified cervical region: Secondary | ICD-10-CM | POA: Diagnosis not present

## 2018-10-02 DIAGNOSIS — M0600 Rheumatoid arthritis without rheumatoid factor, unspecified site: Secondary | ICD-10-CM | POA: Diagnosis not present

## 2018-10-02 DIAGNOSIS — F411 Generalized anxiety disorder: Secondary | ICD-10-CM | POA: Diagnosis not present

## 2018-10-06 DIAGNOSIS — M0600 Rheumatoid arthritis without rheumatoid factor, unspecified site: Secondary | ICD-10-CM | POA: Diagnosis not present

## 2018-10-06 DIAGNOSIS — F411 Generalized anxiety disorder: Secondary | ICD-10-CM | POA: Diagnosis not present

## 2018-10-06 DIAGNOSIS — M80021D Age-related osteoporosis with current pathological fracture, right humerus, subsequent encounter for fracture with routine healing: Secondary | ICD-10-CM | POA: Diagnosis not present

## 2018-10-06 DIAGNOSIS — K219 Gastro-esophageal reflux disease without esophagitis: Secondary | ICD-10-CM | POA: Diagnosis not present

## 2018-10-06 DIAGNOSIS — I10 Essential (primary) hypertension: Secondary | ICD-10-CM | POA: Diagnosis not present

## 2018-10-06 DIAGNOSIS — M5030 Other cervical disc degeneration, unspecified cervical region: Secondary | ICD-10-CM | POA: Diagnosis not present

## 2018-10-07 DIAGNOSIS — M5030 Other cervical disc degeneration, unspecified cervical region: Secondary | ICD-10-CM | POA: Diagnosis not present

## 2018-10-07 DIAGNOSIS — F411 Generalized anxiety disorder: Secondary | ICD-10-CM | POA: Diagnosis not present

## 2018-10-07 DIAGNOSIS — M80021D Age-related osteoporosis with current pathological fracture, right humerus, subsequent encounter for fracture with routine healing: Secondary | ICD-10-CM | POA: Diagnosis not present

## 2018-10-07 DIAGNOSIS — K219 Gastro-esophageal reflux disease without esophagitis: Secondary | ICD-10-CM | POA: Diagnosis not present

## 2018-10-07 DIAGNOSIS — M0600 Rheumatoid arthritis without rheumatoid factor, unspecified site: Secondary | ICD-10-CM | POA: Diagnosis not present

## 2018-10-07 DIAGNOSIS — I10 Essential (primary) hypertension: Secondary | ICD-10-CM | POA: Diagnosis not present

## 2018-10-08 DIAGNOSIS — K219 Gastro-esophageal reflux disease without esophagitis: Secondary | ICD-10-CM | POA: Diagnosis not present

## 2018-10-08 DIAGNOSIS — I10 Essential (primary) hypertension: Secondary | ICD-10-CM | POA: Diagnosis not present

## 2018-10-08 DIAGNOSIS — M80021D Age-related osteoporosis with current pathological fracture, right humerus, subsequent encounter for fracture with routine healing: Secondary | ICD-10-CM | POA: Diagnosis not present

## 2018-10-08 DIAGNOSIS — S42291D Other displaced fracture of upper end of right humerus, subsequent encounter for fracture with routine healing: Secondary | ICD-10-CM | POA: Diagnosis not present

## 2018-10-08 DIAGNOSIS — F411 Generalized anxiety disorder: Secondary | ICD-10-CM | POA: Diagnosis not present

## 2018-10-08 DIAGNOSIS — M5030 Other cervical disc degeneration, unspecified cervical region: Secondary | ICD-10-CM | POA: Diagnosis not present

## 2018-10-08 DIAGNOSIS — M0600 Rheumatoid arthritis without rheumatoid factor, unspecified site: Secondary | ICD-10-CM | POA: Diagnosis not present

## 2018-10-09 DIAGNOSIS — M0600 Rheumatoid arthritis without rheumatoid factor, unspecified site: Secondary | ICD-10-CM | POA: Diagnosis not present

## 2018-10-09 DIAGNOSIS — K219 Gastro-esophageal reflux disease without esophagitis: Secondary | ICD-10-CM | POA: Diagnosis not present

## 2018-10-09 DIAGNOSIS — F411 Generalized anxiety disorder: Secondary | ICD-10-CM | POA: Diagnosis not present

## 2018-10-09 DIAGNOSIS — I10 Essential (primary) hypertension: Secondary | ICD-10-CM | POA: Diagnosis not present

## 2018-10-09 DIAGNOSIS — M80021D Age-related osteoporosis with current pathological fracture, right humerus, subsequent encounter for fracture with routine healing: Secondary | ICD-10-CM | POA: Diagnosis not present

## 2018-10-09 DIAGNOSIS — M5030 Other cervical disc degeneration, unspecified cervical region: Secondary | ICD-10-CM | POA: Diagnosis not present

## 2018-10-12 DIAGNOSIS — M25531 Pain in right wrist: Secondary | ICD-10-CM | POA: Diagnosis not present

## 2018-10-12 DIAGNOSIS — K219 Gastro-esophageal reflux disease without esophagitis: Secondary | ICD-10-CM | POA: Diagnosis not present

## 2018-10-12 DIAGNOSIS — I10 Essential (primary) hypertension: Secondary | ICD-10-CM | POA: Diagnosis not present

## 2018-10-12 DIAGNOSIS — M0609 Rheumatoid arthritis without rheumatoid factor, multiple sites: Secondary | ICD-10-CM | POA: Diagnosis not present

## 2018-10-12 DIAGNOSIS — M0600 Rheumatoid arthritis without rheumatoid factor, unspecified site: Secondary | ICD-10-CM | POA: Diagnosis not present

## 2018-10-12 DIAGNOSIS — M5030 Other cervical disc degeneration, unspecified cervical region: Secondary | ICD-10-CM | POA: Diagnosis not present

## 2018-10-12 DIAGNOSIS — M80021D Age-related osteoporosis with current pathological fracture, right humerus, subsequent encounter for fracture with routine healing: Secondary | ICD-10-CM | POA: Diagnosis not present

## 2018-10-12 DIAGNOSIS — F411 Generalized anxiety disorder: Secondary | ICD-10-CM | POA: Diagnosis not present

## 2018-10-13 DIAGNOSIS — M0609 Rheumatoid arthritis without rheumatoid factor, multiple sites: Secondary | ICD-10-CM | POA: Diagnosis not present

## 2018-10-15 DIAGNOSIS — K219 Gastro-esophageal reflux disease without esophagitis: Secondary | ICD-10-CM | POA: Diagnosis not present

## 2018-10-15 DIAGNOSIS — M0600 Rheumatoid arthritis without rheumatoid factor, unspecified site: Secondary | ICD-10-CM | POA: Diagnosis not present

## 2018-10-15 DIAGNOSIS — M80021D Age-related osteoporosis with current pathological fracture, right humerus, subsequent encounter for fracture with routine healing: Secondary | ICD-10-CM | POA: Diagnosis not present

## 2018-10-15 DIAGNOSIS — F411 Generalized anxiety disorder: Secondary | ICD-10-CM | POA: Diagnosis not present

## 2018-10-15 DIAGNOSIS — I10 Essential (primary) hypertension: Secondary | ICD-10-CM | POA: Diagnosis not present

## 2018-10-15 DIAGNOSIS — M5030 Other cervical disc degeneration, unspecified cervical region: Secondary | ICD-10-CM | POA: Diagnosis not present

## 2018-10-21 DIAGNOSIS — M0600 Rheumatoid arthritis without rheumatoid factor, unspecified site: Secondary | ICD-10-CM | POA: Diagnosis not present

## 2018-10-21 DIAGNOSIS — M80021D Age-related osteoporosis with current pathological fracture, right humerus, subsequent encounter for fracture with routine healing: Secondary | ICD-10-CM | POA: Diagnosis not present

## 2018-10-21 DIAGNOSIS — F411 Generalized anxiety disorder: Secondary | ICD-10-CM | POA: Diagnosis not present

## 2018-10-21 DIAGNOSIS — I10 Essential (primary) hypertension: Secondary | ICD-10-CM | POA: Diagnosis not present

## 2018-10-21 DIAGNOSIS — M5030 Other cervical disc degeneration, unspecified cervical region: Secondary | ICD-10-CM | POA: Diagnosis not present

## 2018-10-21 DIAGNOSIS — K219 Gastro-esophageal reflux disease without esophagitis: Secondary | ICD-10-CM | POA: Diagnosis not present

## 2018-10-23 DIAGNOSIS — F411 Generalized anxiety disorder: Secondary | ICD-10-CM | POA: Diagnosis not present

## 2018-10-23 DIAGNOSIS — M5030 Other cervical disc degeneration, unspecified cervical region: Secondary | ICD-10-CM | POA: Diagnosis not present

## 2018-10-23 DIAGNOSIS — M80021D Age-related osteoporosis with current pathological fracture, right humerus, subsequent encounter for fracture with routine healing: Secondary | ICD-10-CM | POA: Diagnosis not present

## 2018-10-23 DIAGNOSIS — K219 Gastro-esophageal reflux disease without esophagitis: Secondary | ICD-10-CM | POA: Diagnosis not present

## 2018-10-23 DIAGNOSIS — M0600 Rheumatoid arthritis without rheumatoid factor, unspecified site: Secondary | ICD-10-CM | POA: Diagnosis not present

## 2018-10-23 DIAGNOSIS — I10 Essential (primary) hypertension: Secondary | ICD-10-CM | POA: Diagnosis not present

## 2018-10-26 DIAGNOSIS — I10 Essential (primary) hypertension: Secondary | ICD-10-CM | POA: Diagnosis not present

## 2018-10-26 DIAGNOSIS — M0600 Rheumatoid arthritis without rheumatoid factor, unspecified site: Secondary | ICD-10-CM | POA: Diagnosis not present

## 2018-10-26 DIAGNOSIS — Z86010 Personal history of colonic polyps: Secondary | ICD-10-CM | POA: Diagnosis not present

## 2018-10-26 DIAGNOSIS — K219 Gastro-esophageal reflux disease without esophagitis: Secondary | ICD-10-CM | POA: Diagnosis not present

## 2018-10-26 DIAGNOSIS — M80021D Age-related osteoporosis with current pathological fracture, right humerus, subsequent encounter for fracture with routine healing: Secondary | ICD-10-CM | POA: Diagnosis not present

## 2018-10-26 DIAGNOSIS — M5030 Other cervical disc degeneration, unspecified cervical region: Secondary | ICD-10-CM | POA: Diagnosis not present

## 2018-10-26 DIAGNOSIS — F411 Generalized anxiety disorder: Secondary | ICD-10-CM | POA: Diagnosis not present

## 2018-10-26 DIAGNOSIS — Z96611 Presence of right artificial shoulder joint: Secondary | ICD-10-CM | POA: Diagnosis not present

## 2018-10-26 DIAGNOSIS — Z86718 Personal history of other venous thrombosis and embolism: Secondary | ICD-10-CM | POA: Diagnosis not present

## 2018-10-26 DIAGNOSIS — Z9181 History of falling: Secondary | ICD-10-CM | POA: Diagnosis not present

## 2018-10-28 DIAGNOSIS — M5030 Other cervical disc degeneration, unspecified cervical region: Secondary | ICD-10-CM | POA: Diagnosis not present

## 2018-10-28 DIAGNOSIS — M80021D Age-related osteoporosis with current pathological fracture, right humerus, subsequent encounter for fracture with routine healing: Secondary | ICD-10-CM | POA: Diagnosis not present

## 2018-10-28 DIAGNOSIS — F411 Generalized anxiety disorder: Secondary | ICD-10-CM | POA: Diagnosis not present

## 2018-10-28 DIAGNOSIS — M0600 Rheumatoid arthritis without rheumatoid factor, unspecified site: Secondary | ICD-10-CM | POA: Diagnosis not present

## 2018-10-28 DIAGNOSIS — K219 Gastro-esophageal reflux disease without esophagitis: Secondary | ICD-10-CM | POA: Diagnosis not present

## 2018-10-28 DIAGNOSIS — I10 Essential (primary) hypertension: Secondary | ICD-10-CM | POA: Diagnosis not present

## 2018-10-30 DIAGNOSIS — M0600 Rheumatoid arthritis without rheumatoid factor, unspecified site: Secondary | ICD-10-CM | POA: Diagnosis not present

## 2018-10-30 DIAGNOSIS — F411 Generalized anxiety disorder: Secondary | ICD-10-CM | POA: Diagnosis not present

## 2018-10-30 DIAGNOSIS — K219 Gastro-esophageal reflux disease without esophagitis: Secondary | ICD-10-CM | POA: Diagnosis not present

## 2018-10-30 DIAGNOSIS — M5030 Other cervical disc degeneration, unspecified cervical region: Secondary | ICD-10-CM | POA: Diagnosis not present

## 2018-10-30 DIAGNOSIS — M80021D Age-related osteoporosis with current pathological fracture, right humerus, subsequent encounter for fracture with routine healing: Secondary | ICD-10-CM | POA: Diagnosis not present

## 2018-10-30 DIAGNOSIS — I10 Essential (primary) hypertension: Secondary | ICD-10-CM | POA: Diagnosis not present

## 2018-11-02 DIAGNOSIS — M0600 Rheumatoid arthritis without rheumatoid factor, unspecified site: Secondary | ICD-10-CM | POA: Diagnosis not present

## 2018-11-02 DIAGNOSIS — K219 Gastro-esophageal reflux disease without esophagitis: Secondary | ICD-10-CM | POA: Diagnosis not present

## 2018-11-02 DIAGNOSIS — M80021D Age-related osteoporosis with current pathological fracture, right humerus, subsequent encounter for fracture with routine healing: Secondary | ICD-10-CM | POA: Diagnosis not present

## 2018-11-02 DIAGNOSIS — F411 Generalized anxiety disorder: Secondary | ICD-10-CM | POA: Diagnosis not present

## 2018-11-02 DIAGNOSIS — I10 Essential (primary) hypertension: Secondary | ICD-10-CM | POA: Diagnosis not present

## 2018-11-02 DIAGNOSIS — M5030 Other cervical disc degeneration, unspecified cervical region: Secondary | ICD-10-CM | POA: Diagnosis not present

## 2018-11-05 DIAGNOSIS — M5030 Other cervical disc degeneration, unspecified cervical region: Secondary | ICD-10-CM | POA: Diagnosis not present

## 2018-11-05 DIAGNOSIS — K219 Gastro-esophageal reflux disease without esophagitis: Secondary | ICD-10-CM | POA: Diagnosis not present

## 2018-11-05 DIAGNOSIS — M80021D Age-related osteoporosis with current pathological fracture, right humerus, subsequent encounter for fracture with routine healing: Secondary | ICD-10-CM | POA: Diagnosis not present

## 2018-11-05 DIAGNOSIS — M0600 Rheumatoid arthritis without rheumatoid factor, unspecified site: Secondary | ICD-10-CM | POA: Diagnosis not present

## 2018-11-05 DIAGNOSIS — F411 Generalized anxiety disorder: Secondary | ICD-10-CM | POA: Diagnosis not present

## 2018-11-05 DIAGNOSIS — I10 Essential (primary) hypertension: Secondary | ICD-10-CM | POA: Diagnosis not present

## 2018-11-06 DIAGNOSIS — S42291D Other displaced fracture of upper end of right humerus, subsequent encounter for fracture with routine healing: Secondary | ICD-10-CM | POA: Diagnosis not present

## 2018-11-09 DIAGNOSIS — I10 Essential (primary) hypertension: Secondary | ICD-10-CM | POA: Diagnosis not present

## 2018-11-09 DIAGNOSIS — M5030 Other cervical disc degeneration, unspecified cervical region: Secondary | ICD-10-CM | POA: Diagnosis not present

## 2018-11-09 DIAGNOSIS — F411 Generalized anxiety disorder: Secondary | ICD-10-CM | POA: Diagnosis not present

## 2018-11-09 DIAGNOSIS — M80021D Age-related osteoporosis with current pathological fracture, right humerus, subsequent encounter for fracture with routine healing: Secondary | ICD-10-CM | POA: Diagnosis not present

## 2018-11-09 DIAGNOSIS — K219 Gastro-esophageal reflux disease without esophagitis: Secondary | ICD-10-CM | POA: Diagnosis not present

## 2018-11-09 DIAGNOSIS — M0600 Rheumatoid arthritis without rheumatoid factor, unspecified site: Secondary | ICD-10-CM | POA: Diagnosis not present

## 2018-11-10 ENCOUNTER — Ambulatory Visit (INDEPENDENT_AMBULATORY_CARE_PROVIDER_SITE_OTHER): Payer: Medicare Other | Admitting: Urology

## 2018-11-10 ENCOUNTER — Other Ambulatory Visit: Payer: Self-pay

## 2018-11-10 ENCOUNTER — Encounter: Payer: Self-pay | Admitting: Urology

## 2018-11-10 VITALS — BP 122/78 | HR 78 | Ht 66.5 in | Wt 144.0 lb

## 2018-11-10 DIAGNOSIS — N135 Crossing vessel and stricture of ureter without hydronephrosis: Secondary | ICD-10-CM

## 2018-11-10 DIAGNOSIS — R3129 Other microscopic hematuria: Secondary | ICD-10-CM

## 2018-11-10 DIAGNOSIS — M0609 Rheumatoid arthritis without rheumatoid factor, multiple sites: Secondary | ICD-10-CM | POA: Diagnosis not present

## 2018-11-10 LAB — MICROSCOPIC EXAMINATION
Bacteria, UA: NONE SEEN
WBC, UA: NONE SEEN /hpf (ref 0–5)

## 2018-11-10 LAB — URINALYSIS, COMPLETE
Bilirubin, UA: NEGATIVE
Glucose, UA: NEGATIVE
Ketones, UA: NEGATIVE
Leukocytes,UA: NEGATIVE
Nitrite, UA: NEGATIVE
Protein,UA: NEGATIVE
Specific Gravity, UA: 1.02 (ref 1.005–1.030)
Urobilinogen, Ur: 0.2 mg/dL (ref 0.2–1.0)
pH, UA: 7 (ref 5.0–7.5)

## 2018-11-10 NOTE — Progress Notes (Signed)
11/10/2018 9:13 AM   Teresa Lin [birth year] [id number]  Referring provider: Dion Body, MD [facility] [place],  [place] [address]  Chief Complaint  Patient presents with  . Hematuria    New Patient    HPI: Extremely pleasant 85 year old female presents today for further evaluation of microscopic hematuria.  She was referred by her primary care physician back in February for persistent microscopic hematuria for the past several years.  She is been noted to have 4-10 red blood cells per high-power field in the absence of infection.  She is otherwise asymptomatic from this.  She denies any gross hematuria, urgency or frequency.  No UTIs recently.  She is a never smoker.  Her follow-up has been delayed due to COVID-19 as well as a recent shoulder replacement.  She is now 6 weeks postop and recovering well.  She did undergo a CT urogram back in February 2020 ordered by her primary care physician showed no significant pathology.  Notably, she did have a small crossing vessel on the left with slight predominance of her left renal pelvis without overt hydronephrosis.  She denies any history of flank pain.    PMH: Past Medical History:  Diagnosis Date  . Anemia   . Anxiety   . Arthritis    osteoarthritis. Bilateral knee replacement, hands, meniscal tear, cervical disc disease  . Atherosclerosis   . Atypical chest pain   . Collagen vascular disease ([place])   . Colon polyp 12/15/2017  . Colon polyps   . Compression fracture of L2 ([place])   . Diverticulosis   . DVT (deep venous thrombosis) ([place])   . GERD (gastroesophageal reflux disease)   . History of seronegative inflammatory arthritis   . Hypertension   . IBS (irritable bowel syndrome)   . Internal hemorrhoids   . Phlebitis and thrombophlebitis of deep veins of lower extremities (HCC)    after surgery  . Vision abnormalities     Surgical History: Past Surgical History:  Procedure  Laterality Date  . APPENDECTOMY    . COLONOSCOPY WITH PROPOFOL N/A 10/24/2017   Procedure: COLONOSCOPY WITH PROPOFOL;  Surgeon: Manya Silvas, MD;  Location[facility] END[facility]COPY;  Service: Endoscopy;  Laterality: N/A;  . COLONOSCOPY WITH PROPOFOL N/A 11/21/2017   Procedure: COLONOSCOPY WITH PROPOFOL;  Surgeon: Manya Silvas, MD;  Location: [facility] ENDOSCOPY;  Service: Endoscopy;  Laterality: N/A;  . DILATION AND CURETTAGE OF UTERUS    . ESOPHAGOGASTRODUODENOSCOPY (EGD) WITH PROPOFOL N/A 02/25/2018   Procedure: ESOPHAGOGASTRODUODENOSCOPY (EGD) WITH PROPOFOL;  Surgeon: Manya Silvas, MD;  Location: [facility] ENDOSCOPY;  Service: Endoscopy;  Laterality: N/A;  . EYE SURGERY     bilateral catarACT  . JOINT REPLACEMENT Bilateral    knee replacement  . LAPAROSCOPIC RIGHT COLECTOMY Right 12/15/2017   Procedure: LAPAROSCOPIC RIGHT COLECTOMY;  Surgeon: Robert Bellow, MD;  Location: [facility] ORS;  Service: General;  Laterality: Right;  . REPLACEMENT TOTAL KNEE BILATERAL    . TOTAL SHOULDER ARTHROPLASTY Right 09/24/2018   Procedure: TOTAL SHOULDER ARTHROPLASTY - REVERSE RIGHT;  Surgeon: Corky Mull, MD;  Location: [facility] ORS;  Service: Orthopedics;  Laterality: Right;    Home Medications:  Allergies as of 11/10/2018      Reactions   Infliximab Other (See Comments)   Shaking, pain   Synvisc [hylan G-f 20] Swelling      Medication List       Accurate as of November 10, 2018  9:13 AM. If you have  any questions, ask your nurse or doctor.        STOP taking these medications   cyclobenzaprine 10 MG tablet Commonly known as: FLEXERIL Stopped by: Hollice Espy, MD   HYDROcodone-acetaminophen 5-325 MG tablet Commonly known as: NORCO/VICODIN Stopped by: Hollice Espy, MD   meloxicam 15 MG tablet Commonly known as: MOBIC Stopped by: Hollice Espy, MD     TAKE these medications   alendronate 70 MG tablet Commonly known as: FOSAMAX Take 70 mg by mouth every Monday.   aspirin EC 325 MG tablet  Take 1 tablet (325 mg total) by mouth daily.   calcium-vitamin D 500-200 MG-UNIT tablet Commonly known as: OSCAL WITH D Take 1 tablet by mouth daily.   folic acid 1 MG tablet Commonly known as: FOLVITE Take 2 mg by mouth daily.   hydroxychloroquine 200 MG tablet Commonly known as: PLAQUENIL Take 200 mg by mouth daily.   methotrexate 50 MG/2ML injection Inject 20 mg into the skin every Sunday.   multivitamin with minerals tablet Take 1 tablet by mouth daily. Centrum Silver   ORENCIA IV Inject into the vein every 30 (thirty) days.   Systane 0.4-0.3 % Soln Generic drug: Polyethyl Glycol-Propyl Glycol Place 1 drop into both eyes 3 (three) times daily as needed (for eye burning/irritation).       Allergies:  Allergies  Allergen Reactions  . Infliximab Other (See Comments)    Shaking, pain  . Synvisc [Hylan G-F 20] Swelling    Family History: Family History  Problem Relation Age of Onset  . Hypertension Mother   . Cancer Mother 67       colon ca  . Heart attack Mother   . Hypertension Father   . Heart attack Father   . Hypertension Sister   . Heart attack Sister   . Multiple sclerosis Daughter   . Multiple sclerosis Son   . Multiple sclerosis Son     Social History:  reports that she has never smoked. She has never used smokeless tobacco. She reports current alcohol use of about 2.0 standard drinks of alcohol per week. She reports that she does not use drugs.  ROS: UROLOGY Frequent Urination?: Yes Hard to postpone urination?: Yes Burning/pain with urination?: Yes Get up at night to urinate?: Yes Leakage of urine?: No Urine stream starts and stops?: No Trouble starting stream?: No Do you have to strain to urinate?: Yes Blood in urine?: Yes Urinary tract infection?: No Sexually transmitted disease?: No Injury to kidneys or bladder?: No Painful intercourse?: No Weak stream?: Yes Currently pregnant?: No Vaginal bleeding?: No Last menstrual period?: N   Gastrointestinal Nausea?: No Vomiting?: No Indigestion/heartburn?: No Diarrhea?: No Constipation?: No  Constitutional Fever: No Night sweats?: No Weight loss?: No Fatigue?: No  Skin Skin rash/lesions?: No Itching?: No  Eyes Blurred vision?: No Double vision?: No  Ears/Nose/Throat Sore throat?: No Sinus problems?: No  Hematologic/Lymphatic Swollen glands?: No Easy bruising?: No  Cardiovascular Chest pain?: No  Respiratory Cough?: No Shortness of breath?: No  Endocrine Excessive thirst?: No  Musculoskeletal Back pain?: No Joint pain?: No  Neurological Headaches?: No Dizziness?: No  Psychologic Depression?: No Anxiety?: No  Physical Exam: BP 122/78   Pulse 78   Ht 5' 6.5" (1.689 m)   Wt 144 lb (65.3 kg)   BMI 22.89 kg/m   Constitutional:  Alert and oriented, No acute distress. HEENT: [place] AT, moist mucus membranes.  Trachea midline, no masses. Cardiovascular: No clubbing, cyanosis, or edema. Respiratory: Normal respiratory effort,  no increased work of breathing. GI: Abdomen is soft, nontender, nondistended, no abdominal masses GU: No CVA tenderness Pelvic: Slight vaginal atrophy appreciated.  Normal urethral meatus. Skin: No rashes, bruises or suspicious lesions. Neurologic: Grossly intact, no focal deficits, moving all 4 extremities. Psychiatric: Normal mood and affect.  Laboratory Data: Lab Results  Component Value Date   WBC 10.4 09/25/2018   HGB 10.8 (L) 09/25/2018   HCT 33.0 (L) 09/25/2018   MCV 97.9 09/25/2018   PLT 280 09/25/2018    Lab Results  Component Value Date   CREATININE 0.44 09/25/2018    Lab Results  Component Value Date   HGBA1C 5.6 09/05/2014    Urinalysis Numerous urinalyses reviewed in care everywhere.  She had 4-10 red blood cells per high-powered field on multiple urinalyses dating back to 2016.  Also shows 3-10 red blood cells per high-power.  Imaging: CLINICAL DATA:  Microscopic hematuria. Mild pelvic  pain with frequent bowel movements for 3 weeks. Laparoscopic right colectomy 12/15/2017.  EXAM: CT ABDOMEN AND PELVIS WITHOUT AND WITH CONTRAST  TECHNIQUE: Multidetector CT imaging of the abdomen and pelvis was performed following the standard protocol before and following the bolus administration of intravenous contrast.  CONTRAST:  12mL ISOVUE-300 IOPAMIDOL (ISOVUE-300) INJECTION 61%  Creatinine was obtained on site at [place] at [address]  Results: Creatinine 0.6 mg/dL.  COMPARISON:  02/10/2015  FINDINGS: Lower chest: Clear lung bases. Normal heart size without pericardial or pleural effusion.  Hepatobiliary: Hepatomegaly at 19.2 cm. 3 mm gallstone without acute cholecystitis or biliary duct dilatation.  Pancreas: Again identified is probable pancreas divisum, with a prominent dorsal duct entering the duodenum. No acute pancreatitis or duct dilatation.  Spleen: Normal in size, without focal abnormality.  Adrenals/Urinary Tract: Normal adrenal glands. No renal calculi or hydronephrosis. Bilateral extrarenal pelves. No hydroureter or ureteric calculi. No bladder calculi. A too small to characterize interpolar left renal lesion. No suspicious renal mass. Good renal collecting system opacification on delayed images. Good ureteric opacification, without filling defect. No enhancing bladder mass or filling defect on delayed images.  Stomach/Bowel: Normal stomach, without wall thickening. Extensive colonic diverticulosis. Colonic stool burden suggests constipation. Right hemicolectomy. Normal small bowel.  Vascular/Lymphatic: Aortic and branch vessel atherosclerosis. No abdominopelvic adenopathy.  Reproductive: Normal uterus and adnexa.  Other: No significant free fluid. Mild pelvic floor laxity.  Musculoskeletal: Right inferior pubic ramus remote fracture. Convex right lumbar spine curvature. Lumbosacral spondylosis. A L2  compression deformity is new since 02/10/2015 and mild. No ventral canal encroachment.  IMPRESSION: 1. No acute process or explanation for hematuria. 2. Right hemicolectomy.  Possible constipation. 3. Cholelithiasis. 4. L2 compression deformity, new since 02/10/2015.  Aortic Atherosclerosis (ICD10-I70.0).   Electronically Signed   By: Abigail Miyamoto M.D.   On: 07/03/2018 15:10  CT urogram was personally reviewed today.  Agree with radiologic interpretation.  She does have a small crossing vessel on the left across the proximal ureter with some slight tortuosity of the ureter and renal pelvic fullness but no overt hydronephrosis.  Assessment & Plan:    1. Microscopic hematuria Stated on microscopic hematuria on multiple occasions  Differential diagnosis for this was discussed along with a UA guidelines for further diagnostic evaluation.  She is Artie had a CT urogram.  In order to limit the number of in person visits, I have offered her cystoscopy today to complete her work-up to which she is agreeable.  Please see additional note.  Cystoscopy today was unremarkable.  I  have advised that she follow back up with [place] in 2 to 3 years if her microscopic hematuria persist for repeat upper tract imaging, possibly in the form of renal ultrasound as well as repeat cystoscopy.  She is agreeable this plan. - Urinalysis, Complete  2. Ureteropelvic junction (UPJ) obstruction, left Incisional possible left UPJ obstruction, appears to be extremely mild and is otherwise asymptomatic without atrophy or symptoms  No additional evaluation warranted at this time  F/u as needed   11/10/18  CC:  Chief Complaint  Patient presents with  . Hematuria    New Patient    Cystoscopy Procedure Note  Patient identification was confirmed, informed consent was obtained, and patient was prepped using Betadine solution.  Lidocaine jelly was administered per urethral meatus.    Procedure: -  Flexible cystoscope introduced, without any difficulty.   - Thorough search of the bladder revealed:    normal urethral meatus    normal urothelium    no stones    no ulcers     no tumors    no urethral polyps    no trabeculation  - Ureteral orifices were normal in position and appearance.  Post-Procedure: - Patient tolerated the procedure well   [place] [address], [place], [place] [address] [phone]

## 2018-11-12 DIAGNOSIS — I10 Essential (primary) hypertension: Secondary | ICD-10-CM | POA: Diagnosis not present

## 2018-11-12 DIAGNOSIS — F411 Generalized anxiety disorder: Secondary | ICD-10-CM | POA: Diagnosis not present

## 2018-11-12 DIAGNOSIS — M80021D Age-related osteoporosis with current pathological fracture, right humerus, subsequent encounter for fracture with routine healing: Secondary | ICD-10-CM | POA: Diagnosis not present

## 2018-11-12 DIAGNOSIS — K219 Gastro-esophageal reflux disease without esophagitis: Secondary | ICD-10-CM | POA: Diagnosis not present

## 2018-11-12 DIAGNOSIS — M0600 Rheumatoid arthritis without rheumatoid factor, unspecified site: Secondary | ICD-10-CM | POA: Diagnosis not present

## 2018-11-12 DIAGNOSIS — M5030 Other cervical disc degeneration, unspecified cervical region: Secondary | ICD-10-CM | POA: Diagnosis not present

## 2018-11-16 DIAGNOSIS — F411 Generalized anxiety disorder: Secondary | ICD-10-CM | POA: Diagnosis not present

## 2018-11-16 DIAGNOSIS — M5030 Other cervical disc degeneration, unspecified cervical region: Secondary | ICD-10-CM | POA: Diagnosis not present

## 2018-11-16 DIAGNOSIS — M0600 Rheumatoid arthritis without rheumatoid factor, unspecified site: Secondary | ICD-10-CM | POA: Diagnosis not present

## 2018-11-16 DIAGNOSIS — M80021D Age-related osteoporosis with current pathological fracture, right humerus, subsequent encounter for fracture with routine healing: Secondary | ICD-10-CM | POA: Diagnosis not present

## 2018-11-16 DIAGNOSIS — I10 Essential (primary) hypertension: Secondary | ICD-10-CM | POA: Diagnosis not present

## 2018-11-16 DIAGNOSIS — K219 Gastro-esophageal reflux disease without esophagitis: Secondary | ICD-10-CM | POA: Diagnosis not present

## 2018-11-19 DIAGNOSIS — F411 Generalized anxiety disorder: Secondary | ICD-10-CM | POA: Diagnosis not present

## 2018-11-19 DIAGNOSIS — M80021D Age-related osteoporosis with current pathological fracture, right humerus, subsequent encounter for fracture with routine healing: Secondary | ICD-10-CM | POA: Diagnosis not present

## 2018-11-19 DIAGNOSIS — M0600 Rheumatoid arthritis without rheumatoid factor, unspecified site: Secondary | ICD-10-CM | POA: Diagnosis not present

## 2018-11-19 DIAGNOSIS — M5030 Other cervical disc degeneration, unspecified cervical region: Secondary | ICD-10-CM | POA: Diagnosis not present

## 2018-11-19 DIAGNOSIS — K219 Gastro-esophageal reflux disease without esophagitis: Secondary | ICD-10-CM | POA: Diagnosis not present

## 2018-11-19 DIAGNOSIS — I10 Essential (primary) hypertension: Secondary | ICD-10-CM | POA: Diagnosis not present

## 2018-11-23 DIAGNOSIS — I10 Essential (primary) hypertension: Secondary | ICD-10-CM | POA: Diagnosis not present

## 2018-11-23 DIAGNOSIS — K219 Gastro-esophageal reflux disease without esophagitis: Secondary | ICD-10-CM | POA: Diagnosis not present

## 2018-11-23 DIAGNOSIS — F411 Generalized anxiety disorder: Secondary | ICD-10-CM | POA: Diagnosis not present

## 2018-11-23 DIAGNOSIS — M0600 Rheumatoid arthritis without rheumatoid factor, unspecified site: Secondary | ICD-10-CM | POA: Diagnosis not present

## 2018-11-23 DIAGNOSIS — M5030 Other cervical disc degeneration, unspecified cervical region: Secondary | ICD-10-CM | POA: Diagnosis not present

## 2018-11-23 DIAGNOSIS — M80021D Age-related osteoporosis with current pathological fracture, right humerus, subsequent encounter for fracture with routine healing: Secondary | ICD-10-CM | POA: Diagnosis not present

## 2018-11-25 DIAGNOSIS — Z7982 Long term (current) use of aspirin: Secondary | ICD-10-CM | POA: Diagnosis not present

## 2018-11-25 DIAGNOSIS — K589 Irritable bowel syndrome without diarrhea: Secondary | ICD-10-CM | POA: Diagnosis not present

## 2018-11-25 DIAGNOSIS — I10 Essential (primary) hypertension: Secondary | ICD-10-CM | POA: Diagnosis not present

## 2018-11-25 DIAGNOSIS — Z9181 History of falling: Secondary | ICD-10-CM | POA: Diagnosis not present

## 2018-11-25 DIAGNOSIS — Z96611 Presence of right artificial shoulder joint: Secondary | ICD-10-CM | POA: Diagnosis not present

## 2018-11-25 DIAGNOSIS — D649 Anemia, unspecified: Secondary | ICD-10-CM | POA: Diagnosis not present

## 2018-11-25 DIAGNOSIS — F411 Generalized anxiety disorder: Secondary | ICD-10-CM | POA: Diagnosis not present

## 2018-11-25 DIAGNOSIS — Z86010 Personal history of colonic polyps: Secondary | ICD-10-CM | POA: Diagnosis not present

## 2018-11-25 DIAGNOSIS — Z96653 Presence of artificial knee joint, bilateral: Secondary | ICD-10-CM | POA: Diagnosis not present

## 2018-11-25 DIAGNOSIS — K219 Gastro-esophageal reflux disease without esophagitis: Secondary | ICD-10-CM | POA: Diagnosis not present

## 2018-11-25 DIAGNOSIS — M0600 Rheumatoid arthritis without rheumatoid factor, unspecified site: Secondary | ICD-10-CM | POA: Diagnosis not present

## 2018-11-25 DIAGNOSIS — E538 Deficiency of other specified B group vitamins: Secondary | ICD-10-CM | POA: Diagnosis not present

## 2018-11-25 DIAGNOSIS — M80021D Age-related osteoporosis with current pathological fracture, right humerus, subsequent encounter for fracture with routine healing: Secondary | ICD-10-CM | POA: Diagnosis not present

## 2018-11-25 DIAGNOSIS — G4700 Insomnia, unspecified: Secondary | ICD-10-CM | POA: Diagnosis not present

## 2018-11-25 DIAGNOSIS — M5030 Other cervical disc degeneration, unspecified cervical region: Secondary | ICD-10-CM | POA: Diagnosis not present

## 2018-11-25 DIAGNOSIS — Z86718 Personal history of other venous thrombosis and embolism: Secondary | ICD-10-CM | POA: Diagnosis not present

## 2018-11-26 DIAGNOSIS — M0600 Rheumatoid arthritis without rheumatoid factor, unspecified site: Secondary | ICD-10-CM | POA: Diagnosis not present

## 2018-11-26 DIAGNOSIS — F411 Generalized anxiety disorder: Secondary | ICD-10-CM | POA: Diagnosis not present

## 2018-11-26 DIAGNOSIS — M5030 Other cervical disc degeneration, unspecified cervical region: Secondary | ICD-10-CM | POA: Diagnosis not present

## 2018-11-26 DIAGNOSIS — M80021D Age-related osteoporosis with current pathological fracture, right humerus, subsequent encounter for fracture with routine healing: Secondary | ICD-10-CM | POA: Diagnosis not present

## 2018-11-26 DIAGNOSIS — I10 Essential (primary) hypertension: Secondary | ICD-10-CM | POA: Diagnosis not present

## 2018-11-26 DIAGNOSIS — K589 Irritable bowel syndrome without diarrhea: Secondary | ICD-10-CM | POA: Diagnosis not present

## 2018-11-30 DIAGNOSIS — M5030 Other cervical disc degeneration, unspecified cervical region: Secondary | ICD-10-CM | POA: Diagnosis not present

## 2018-11-30 DIAGNOSIS — M80021D Age-related osteoporosis with current pathological fracture, right humerus, subsequent encounter for fracture with routine healing: Secondary | ICD-10-CM | POA: Diagnosis not present

## 2018-11-30 DIAGNOSIS — K589 Irritable bowel syndrome without diarrhea: Secondary | ICD-10-CM | POA: Diagnosis not present

## 2018-11-30 DIAGNOSIS — I10 Essential (primary) hypertension: Secondary | ICD-10-CM | POA: Diagnosis not present

## 2018-11-30 DIAGNOSIS — M0600 Rheumatoid arthritis without rheumatoid factor, unspecified site: Secondary | ICD-10-CM | POA: Diagnosis not present

## 2018-11-30 DIAGNOSIS — F411 Generalized anxiety disorder: Secondary | ICD-10-CM | POA: Diagnosis not present

## 2018-12-03 DIAGNOSIS — I10 Essential (primary) hypertension: Secondary | ICD-10-CM | POA: Diagnosis not present

## 2018-12-03 DIAGNOSIS — M0600 Rheumatoid arthritis without rheumatoid factor, unspecified site: Secondary | ICD-10-CM | POA: Diagnosis not present

## 2018-12-03 DIAGNOSIS — R1032 Left lower quadrant pain: Secondary | ICD-10-CM | POA: Diagnosis not present

## 2018-12-03 DIAGNOSIS — M80021D Age-related osteoporosis with current pathological fracture, right humerus, subsequent encounter for fracture with routine healing: Secondary | ICD-10-CM | POA: Diagnosis not present

## 2018-12-03 DIAGNOSIS — K589 Irritable bowel syndrome without diarrhea: Secondary | ICD-10-CM | POA: Diagnosis not present

## 2018-12-03 DIAGNOSIS — F411 Generalized anxiety disorder: Secondary | ICD-10-CM | POA: Diagnosis not present

## 2018-12-03 DIAGNOSIS — M5030 Other cervical disc degeneration, unspecified cervical region: Secondary | ICD-10-CM | POA: Diagnosis not present

## 2018-12-03 DIAGNOSIS — K5901 Slow transit constipation: Secondary | ICD-10-CM | POA: Diagnosis not present

## 2018-12-07 DIAGNOSIS — M0600 Rheumatoid arthritis without rheumatoid factor, unspecified site: Secondary | ICD-10-CM | POA: Diagnosis not present

## 2018-12-07 DIAGNOSIS — M80021D Age-related osteoporosis with current pathological fracture, right humerus, subsequent encounter for fracture with routine healing: Secondary | ICD-10-CM | POA: Diagnosis not present

## 2018-12-07 DIAGNOSIS — F411 Generalized anxiety disorder: Secondary | ICD-10-CM | POA: Diagnosis not present

## 2018-12-07 DIAGNOSIS — I10 Essential (primary) hypertension: Secondary | ICD-10-CM | POA: Diagnosis not present

## 2018-12-07 DIAGNOSIS — M5030 Other cervical disc degeneration, unspecified cervical region: Secondary | ICD-10-CM | POA: Diagnosis not present

## 2018-12-07 DIAGNOSIS — K589 Irritable bowel syndrome without diarrhea: Secondary | ICD-10-CM | POA: Diagnosis not present

## 2018-12-08 DIAGNOSIS — M0609 Rheumatoid arthritis without rheumatoid factor, multiple sites: Secondary | ICD-10-CM | POA: Diagnosis not present

## 2018-12-09 DIAGNOSIS — M0600 Rheumatoid arthritis without rheumatoid factor, unspecified site: Secondary | ICD-10-CM | POA: Diagnosis not present

## 2018-12-09 DIAGNOSIS — I10 Essential (primary) hypertension: Secondary | ICD-10-CM | POA: Diagnosis not present

## 2018-12-09 DIAGNOSIS — M5030 Other cervical disc degeneration, unspecified cervical region: Secondary | ICD-10-CM | POA: Diagnosis not present

## 2018-12-09 DIAGNOSIS — K589 Irritable bowel syndrome without diarrhea: Secondary | ICD-10-CM | POA: Diagnosis not present

## 2018-12-09 DIAGNOSIS — M80021D Age-related osteoporosis with current pathological fracture, right humerus, subsequent encounter for fracture with routine healing: Secondary | ICD-10-CM | POA: Diagnosis not present

## 2018-12-09 DIAGNOSIS — F411 Generalized anxiety disorder: Secondary | ICD-10-CM | POA: Diagnosis not present

## 2018-12-14 ENCOUNTER — Other Ambulatory Visit: Payer: Self-pay

## 2018-12-14 DIAGNOSIS — M5030 Other cervical disc degeneration, unspecified cervical region: Secondary | ICD-10-CM | POA: Diagnosis not present

## 2018-12-14 DIAGNOSIS — F411 Generalized anxiety disorder: Secondary | ICD-10-CM | POA: Diagnosis not present

## 2018-12-14 DIAGNOSIS — M80021D Age-related osteoporosis with current pathological fracture, right humerus, subsequent encounter for fracture with routine healing: Secondary | ICD-10-CM | POA: Diagnosis not present

## 2018-12-14 DIAGNOSIS — K589 Irritable bowel syndrome without diarrhea: Secondary | ICD-10-CM | POA: Diagnosis not present

## 2018-12-14 DIAGNOSIS — I10 Essential (primary) hypertension: Secondary | ICD-10-CM | POA: Diagnosis not present

## 2018-12-14 DIAGNOSIS — M0600 Rheumatoid arthritis without rheumatoid factor, unspecified site: Secondary | ICD-10-CM | POA: Diagnosis not present

## 2018-12-17 DIAGNOSIS — K589 Irritable bowel syndrome without diarrhea: Secondary | ICD-10-CM | POA: Diagnosis not present

## 2018-12-17 DIAGNOSIS — I10 Essential (primary) hypertension: Secondary | ICD-10-CM | POA: Diagnosis not present

## 2018-12-17 DIAGNOSIS — F411 Generalized anxiety disorder: Secondary | ICD-10-CM | POA: Diagnosis not present

## 2018-12-17 DIAGNOSIS — M0600 Rheumatoid arthritis without rheumatoid factor, unspecified site: Secondary | ICD-10-CM | POA: Diagnosis not present

## 2018-12-17 DIAGNOSIS — M80021D Age-related osteoporosis with current pathological fracture, right humerus, subsequent encounter for fracture with routine healing: Secondary | ICD-10-CM | POA: Diagnosis not present

## 2018-12-17 DIAGNOSIS — M5030 Other cervical disc degeneration, unspecified cervical region: Secondary | ICD-10-CM | POA: Diagnosis not present

## 2018-12-21 DIAGNOSIS — K589 Irritable bowel syndrome without diarrhea: Secondary | ICD-10-CM | POA: Diagnosis not present

## 2018-12-21 DIAGNOSIS — I10 Essential (primary) hypertension: Secondary | ICD-10-CM | POA: Diagnosis not present

## 2018-12-21 DIAGNOSIS — M0600 Rheumatoid arthritis without rheumatoid factor, unspecified site: Secondary | ICD-10-CM | POA: Diagnosis not present

## 2018-12-21 DIAGNOSIS — M5030 Other cervical disc degeneration, unspecified cervical region: Secondary | ICD-10-CM | POA: Diagnosis not present

## 2018-12-21 DIAGNOSIS — F411 Generalized anxiety disorder: Secondary | ICD-10-CM | POA: Diagnosis not present

## 2018-12-21 DIAGNOSIS — M80021D Age-related osteoporosis with current pathological fracture, right humerus, subsequent encounter for fracture with routine healing: Secondary | ICD-10-CM | POA: Diagnosis not present

## 2018-12-24 DIAGNOSIS — F411 Generalized anxiety disorder: Secondary | ICD-10-CM | POA: Diagnosis not present

## 2018-12-24 DIAGNOSIS — M5030 Other cervical disc degeneration, unspecified cervical region: Secondary | ICD-10-CM | POA: Diagnosis not present

## 2018-12-24 DIAGNOSIS — K589 Irritable bowel syndrome without diarrhea: Secondary | ICD-10-CM | POA: Diagnosis not present

## 2018-12-24 DIAGNOSIS — M0600 Rheumatoid arthritis without rheumatoid factor, unspecified site: Secondary | ICD-10-CM | POA: Diagnosis not present

## 2018-12-24 DIAGNOSIS — I10 Essential (primary) hypertension: Secondary | ICD-10-CM | POA: Diagnosis not present

## 2018-12-24 DIAGNOSIS — M80021D Age-related osteoporosis with current pathological fracture, right humerus, subsequent encounter for fracture with routine healing: Secondary | ICD-10-CM | POA: Diagnosis not present

## 2018-12-25 DIAGNOSIS — Z96611 Presence of right artificial shoulder joint: Secondary | ICD-10-CM | POA: Diagnosis not present

## 2018-12-25 DIAGNOSIS — K589 Irritable bowel syndrome without diarrhea: Secondary | ICD-10-CM | POA: Diagnosis not present

## 2018-12-25 DIAGNOSIS — Z7982 Long term (current) use of aspirin: Secondary | ICD-10-CM | POA: Diagnosis not present

## 2018-12-25 DIAGNOSIS — D649 Anemia, unspecified: Secondary | ICD-10-CM | POA: Diagnosis not present

## 2018-12-25 DIAGNOSIS — F411 Generalized anxiety disorder: Secondary | ICD-10-CM | POA: Diagnosis not present

## 2018-12-25 DIAGNOSIS — Z86010 Personal history of colonic polyps: Secondary | ICD-10-CM | POA: Diagnosis not present

## 2018-12-25 DIAGNOSIS — M0600 Rheumatoid arthritis without rheumatoid factor, unspecified site: Secondary | ICD-10-CM | POA: Diagnosis not present

## 2018-12-25 DIAGNOSIS — Z9181 History of falling: Secondary | ICD-10-CM | POA: Diagnosis not present

## 2018-12-25 DIAGNOSIS — G4700 Insomnia, unspecified: Secondary | ICD-10-CM | POA: Diagnosis not present

## 2018-12-25 DIAGNOSIS — M80021D Age-related osteoporosis with current pathological fracture, right humerus, subsequent encounter for fracture with routine healing: Secondary | ICD-10-CM | POA: Diagnosis not present

## 2018-12-25 DIAGNOSIS — M5030 Other cervical disc degeneration, unspecified cervical region: Secondary | ICD-10-CM | POA: Diagnosis not present

## 2018-12-25 DIAGNOSIS — I10 Essential (primary) hypertension: Secondary | ICD-10-CM | POA: Diagnosis not present

## 2018-12-25 DIAGNOSIS — E538 Deficiency of other specified B group vitamins: Secondary | ICD-10-CM | POA: Diagnosis not present

## 2018-12-25 DIAGNOSIS — K219 Gastro-esophageal reflux disease without esophagitis: Secondary | ICD-10-CM | POA: Diagnosis not present

## 2018-12-25 DIAGNOSIS — Z96653 Presence of artificial knee joint, bilateral: Secondary | ICD-10-CM | POA: Diagnosis not present

## 2018-12-25 DIAGNOSIS — Z86718 Personal history of other venous thrombosis and embolism: Secondary | ICD-10-CM | POA: Diagnosis not present

## 2018-12-28 DIAGNOSIS — F411 Generalized anxiety disorder: Secondary | ICD-10-CM | POA: Diagnosis not present

## 2018-12-28 DIAGNOSIS — M80021D Age-related osteoporosis with current pathological fracture, right humerus, subsequent encounter for fracture with routine healing: Secondary | ICD-10-CM | POA: Diagnosis not present

## 2018-12-28 DIAGNOSIS — M0600 Rheumatoid arthritis without rheumatoid factor, unspecified site: Secondary | ICD-10-CM | POA: Diagnosis not present

## 2018-12-28 DIAGNOSIS — I10 Essential (primary) hypertension: Secondary | ICD-10-CM | POA: Diagnosis not present

## 2018-12-28 DIAGNOSIS — K589 Irritable bowel syndrome without diarrhea: Secondary | ICD-10-CM | POA: Diagnosis not present

## 2018-12-28 DIAGNOSIS — M5030 Other cervical disc degeneration, unspecified cervical region: Secondary | ICD-10-CM | POA: Diagnosis not present

## 2018-12-31 DIAGNOSIS — K589 Irritable bowel syndrome without diarrhea: Secondary | ICD-10-CM | POA: Diagnosis not present

## 2018-12-31 DIAGNOSIS — F411 Generalized anxiety disorder: Secondary | ICD-10-CM | POA: Diagnosis not present

## 2018-12-31 DIAGNOSIS — I10 Essential (primary) hypertension: Secondary | ICD-10-CM | POA: Diagnosis not present

## 2018-12-31 DIAGNOSIS — M80021D Age-related osteoporosis with current pathological fracture, right humerus, subsequent encounter for fracture with routine healing: Secondary | ICD-10-CM | POA: Diagnosis not present

## 2018-12-31 DIAGNOSIS — M5030 Other cervical disc degeneration, unspecified cervical region: Secondary | ICD-10-CM | POA: Diagnosis not present

## 2018-12-31 DIAGNOSIS — M0600 Rheumatoid arthritis without rheumatoid factor, unspecified site: Secondary | ICD-10-CM | POA: Diagnosis not present

## 2019-01-04 DIAGNOSIS — M80021D Age-related osteoporosis with current pathological fracture, right humerus, subsequent encounter for fracture with routine healing: Secondary | ICD-10-CM | POA: Diagnosis not present

## 2019-01-04 DIAGNOSIS — I10 Essential (primary) hypertension: Secondary | ICD-10-CM | POA: Diagnosis not present

## 2019-01-04 DIAGNOSIS — F411 Generalized anxiety disorder: Secondary | ICD-10-CM | POA: Diagnosis not present

## 2019-01-04 DIAGNOSIS — K589 Irritable bowel syndrome without diarrhea: Secondary | ICD-10-CM | POA: Diagnosis not present

## 2019-01-04 DIAGNOSIS — M5030 Other cervical disc degeneration, unspecified cervical region: Secondary | ICD-10-CM | POA: Diagnosis not present

## 2019-01-04 DIAGNOSIS — M0600 Rheumatoid arthritis without rheumatoid factor, unspecified site: Secondary | ICD-10-CM | POA: Diagnosis not present

## 2019-01-05 DIAGNOSIS — M0609 Rheumatoid arthritis without rheumatoid factor, multiple sites: Secondary | ICD-10-CM | POA: Diagnosis not present

## 2019-01-07 DIAGNOSIS — M5030 Other cervical disc degeneration, unspecified cervical region: Secondary | ICD-10-CM | POA: Diagnosis not present

## 2019-01-07 DIAGNOSIS — M0600 Rheumatoid arthritis without rheumatoid factor, unspecified site: Secondary | ICD-10-CM | POA: Diagnosis not present

## 2019-01-07 DIAGNOSIS — K589 Irritable bowel syndrome without diarrhea: Secondary | ICD-10-CM | POA: Diagnosis not present

## 2019-01-07 DIAGNOSIS — F411 Generalized anxiety disorder: Secondary | ICD-10-CM | POA: Diagnosis not present

## 2019-01-07 DIAGNOSIS — I10 Essential (primary) hypertension: Secondary | ICD-10-CM | POA: Diagnosis not present

## 2019-01-07 DIAGNOSIS — M80021D Age-related osteoporosis with current pathological fracture, right humerus, subsequent encounter for fracture with routine healing: Secondary | ICD-10-CM | POA: Diagnosis not present

## 2019-01-11 DIAGNOSIS — K589 Irritable bowel syndrome without diarrhea: Secondary | ICD-10-CM | POA: Diagnosis not present

## 2019-01-11 DIAGNOSIS — I10 Essential (primary) hypertension: Secondary | ICD-10-CM | POA: Diagnosis not present

## 2019-01-11 DIAGNOSIS — M5030 Other cervical disc degeneration, unspecified cervical region: Secondary | ICD-10-CM | POA: Diagnosis not present

## 2019-01-11 DIAGNOSIS — M80021D Age-related osteoporosis with current pathological fracture, right humerus, subsequent encounter for fracture with routine healing: Secondary | ICD-10-CM | POA: Diagnosis not present

## 2019-01-11 DIAGNOSIS — F411 Generalized anxiety disorder: Secondary | ICD-10-CM | POA: Diagnosis not present

## 2019-01-11 DIAGNOSIS — M0600 Rheumatoid arthritis without rheumatoid factor, unspecified site: Secondary | ICD-10-CM | POA: Diagnosis not present

## 2019-01-14 DIAGNOSIS — M80021D Age-related osteoporosis with current pathological fracture, right humerus, subsequent encounter for fracture with routine healing: Secondary | ICD-10-CM | POA: Diagnosis not present

## 2019-01-14 DIAGNOSIS — K589 Irritable bowel syndrome without diarrhea: Secondary | ICD-10-CM | POA: Diagnosis not present

## 2019-01-14 DIAGNOSIS — I10 Essential (primary) hypertension: Secondary | ICD-10-CM | POA: Diagnosis not present

## 2019-01-14 DIAGNOSIS — M0600 Rheumatoid arthritis without rheumatoid factor, unspecified site: Secondary | ICD-10-CM | POA: Diagnosis not present

## 2019-01-14 DIAGNOSIS — F411 Generalized anxiety disorder: Secondary | ICD-10-CM | POA: Diagnosis not present

## 2019-01-14 DIAGNOSIS — M5030 Other cervical disc degeneration, unspecified cervical region: Secondary | ICD-10-CM | POA: Diagnosis not present

## 2019-01-19 DIAGNOSIS — M5030 Other cervical disc degeneration, unspecified cervical region: Secondary | ICD-10-CM | POA: Diagnosis not present

## 2019-01-19 DIAGNOSIS — M0600 Rheumatoid arthritis without rheumatoid factor, unspecified site: Secondary | ICD-10-CM | POA: Diagnosis not present

## 2019-01-19 DIAGNOSIS — K589 Irritable bowel syndrome without diarrhea: Secondary | ICD-10-CM | POA: Diagnosis not present

## 2019-01-19 DIAGNOSIS — I10 Essential (primary) hypertension: Secondary | ICD-10-CM | POA: Diagnosis not present

## 2019-01-19 DIAGNOSIS — F411 Generalized anxiety disorder: Secondary | ICD-10-CM | POA: Diagnosis not present

## 2019-01-19 DIAGNOSIS — M80021D Age-related osteoporosis with current pathological fracture, right humerus, subsequent encounter for fracture with routine healing: Secondary | ICD-10-CM | POA: Diagnosis not present

## 2019-01-21 DIAGNOSIS — K589 Irritable bowel syndrome without diarrhea: Secondary | ICD-10-CM | POA: Diagnosis not present

## 2019-01-21 DIAGNOSIS — F411 Generalized anxiety disorder: Secondary | ICD-10-CM | POA: Diagnosis not present

## 2019-01-21 DIAGNOSIS — M0600 Rheumatoid arthritis without rheumatoid factor, unspecified site: Secondary | ICD-10-CM | POA: Diagnosis not present

## 2019-01-21 DIAGNOSIS — M80021D Age-related osteoporosis with current pathological fracture, right humerus, subsequent encounter for fracture with routine healing: Secondary | ICD-10-CM | POA: Diagnosis not present

## 2019-01-21 DIAGNOSIS — M5030 Other cervical disc degeneration, unspecified cervical region: Secondary | ICD-10-CM | POA: Diagnosis not present

## 2019-01-21 DIAGNOSIS — I10 Essential (primary) hypertension: Secondary | ICD-10-CM | POA: Diagnosis not present

## 2019-01-24 DIAGNOSIS — Z9181 History of falling: Secondary | ICD-10-CM | POA: Diagnosis not present

## 2019-01-24 DIAGNOSIS — M80021D Age-related osteoporosis with current pathological fracture, right humerus, subsequent encounter for fracture with routine healing: Secondary | ICD-10-CM | POA: Diagnosis not present

## 2019-01-24 DIAGNOSIS — M0600 Rheumatoid arthritis without rheumatoid factor, unspecified site: Secondary | ICD-10-CM | POA: Diagnosis not present

## 2019-01-24 DIAGNOSIS — K219 Gastro-esophageal reflux disease without esophagitis: Secondary | ICD-10-CM | POA: Diagnosis not present

## 2019-01-24 DIAGNOSIS — D649 Anemia, unspecified: Secondary | ICD-10-CM | POA: Diagnosis not present

## 2019-01-24 DIAGNOSIS — M5030 Other cervical disc degeneration, unspecified cervical region: Secondary | ICD-10-CM | POA: Diagnosis not present

## 2019-01-24 DIAGNOSIS — Z86010 Personal history of colonic polyps: Secondary | ICD-10-CM | POA: Diagnosis not present

## 2019-01-24 DIAGNOSIS — G4700 Insomnia, unspecified: Secondary | ICD-10-CM | POA: Diagnosis not present

## 2019-01-24 DIAGNOSIS — F411 Generalized anxiety disorder: Secondary | ICD-10-CM | POA: Diagnosis not present

## 2019-01-24 DIAGNOSIS — K589 Irritable bowel syndrome without diarrhea: Secondary | ICD-10-CM | POA: Diagnosis not present

## 2019-01-24 DIAGNOSIS — E538 Deficiency of other specified B group vitamins: Secondary | ICD-10-CM | POA: Diagnosis not present

## 2019-01-24 DIAGNOSIS — I10 Essential (primary) hypertension: Secondary | ICD-10-CM | POA: Diagnosis not present

## 2019-01-24 DIAGNOSIS — Z86718 Personal history of other venous thrombosis and embolism: Secondary | ICD-10-CM | POA: Diagnosis not present

## 2019-01-25 DIAGNOSIS — M1990 Unspecified osteoarthritis, unspecified site: Secondary | ICD-10-CM | POA: Diagnosis not present

## 2019-01-25 DIAGNOSIS — D849 Immunodeficiency, unspecified: Secondary | ICD-10-CM | POA: Diagnosis present

## 2019-01-25 DIAGNOSIS — M5030 Other cervical disc degeneration, unspecified cervical region: Secondary | ICD-10-CM | POA: Diagnosis not present

## 2019-01-25 DIAGNOSIS — M80021D Age-related osteoporosis with current pathological fracture, right humerus, subsequent encounter for fracture with routine healing: Secondary | ICD-10-CM | POA: Diagnosis not present

## 2019-01-25 DIAGNOSIS — F411 Generalized anxiety disorder: Secondary | ICD-10-CM | POA: Diagnosis not present

## 2019-01-25 DIAGNOSIS — K5900 Constipation, unspecified: Secondary | ICD-10-CM | POA: Diagnosis not present

## 2019-01-25 DIAGNOSIS — M0609 Rheumatoid arthritis without rheumatoid factor, multiple sites: Secondary | ICD-10-CM | POA: Diagnosis not present

## 2019-01-25 DIAGNOSIS — I10 Essential (primary) hypertension: Secondary | ICD-10-CM | POA: Diagnosis not present

## 2019-01-25 DIAGNOSIS — E785 Hyperlipidemia, unspecified: Secondary | ICD-10-CM | POA: Diagnosis not present

## 2019-01-25 DIAGNOSIS — Z780 Asymptomatic menopausal state: Secondary | ICD-10-CM | POA: Diagnosis not present

## 2019-01-25 DIAGNOSIS — M810 Age-related osteoporosis without current pathological fracture: Secondary | ICD-10-CM | POA: Diagnosis not present

## 2019-01-25 DIAGNOSIS — K589 Irritable bowel syndrome without diarrhea: Secondary | ICD-10-CM | POA: Diagnosis not present

## 2019-01-25 DIAGNOSIS — G4700 Insomnia, unspecified: Secondary | ICD-10-CM | POA: Diagnosis not present

## 2019-01-25 DIAGNOSIS — Z0000 Encounter for general adult medical examination without abnormal findings: Secondary | ICD-10-CM | POA: Diagnosis not present

## 2019-01-25 DIAGNOSIS — R739 Hyperglycemia, unspecified: Secondary | ICD-10-CM | POA: Diagnosis not present

## 2019-01-25 DIAGNOSIS — I700 Atherosclerosis of aorta: Secondary | ICD-10-CM | POA: Diagnosis not present

## 2019-01-25 DIAGNOSIS — R311 Benign essential microscopic hematuria: Secondary | ICD-10-CM | POA: Diagnosis not present

## 2019-01-25 DIAGNOSIS — M0600 Rheumatoid arthritis without rheumatoid factor, unspecified site: Secondary | ICD-10-CM | POA: Diagnosis not present

## 2019-01-28 DIAGNOSIS — M0600 Rheumatoid arthritis without rheumatoid factor, unspecified site: Secondary | ICD-10-CM | POA: Diagnosis not present

## 2019-01-28 DIAGNOSIS — K589 Irritable bowel syndrome without diarrhea: Secondary | ICD-10-CM | POA: Diagnosis not present

## 2019-01-28 DIAGNOSIS — M80021D Age-related osteoporosis with current pathological fracture, right humerus, subsequent encounter for fracture with routine healing: Secondary | ICD-10-CM | POA: Diagnosis not present

## 2019-01-28 DIAGNOSIS — F411 Generalized anxiety disorder: Secondary | ICD-10-CM | POA: Diagnosis not present

## 2019-01-28 DIAGNOSIS — M5030 Other cervical disc degeneration, unspecified cervical region: Secondary | ICD-10-CM | POA: Diagnosis not present

## 2019-01-28 DIAGNOSIS — I10 Essential (primary) hypertension: Secondary | ICD-10-CM | POA: Diagnosis not present

## 2019-01-29 DIAGNOSIS — H9190 Unspecified hearing loss, unspecified ear: Secondary | ICD-10-CM | POA: Diagnosis not present

## 2019-01-29 DIAGNOSIS — H6123 Impacted cerumen, bilateral: Secondary | ICD-10-CM | POA: Diagnosis not present

## 2019-02-01 DIAGNOSIS — M0600 Rheumatoid arthritis without rheumatoid factor, unspecified site: Secondary | ICD-10-CM | POA: Diagnosis not present

## 2019-02-01 DIAGNOSIS — I10 Essential (primary) hypertension: Secondary | ICD-10-CM | POA: Diagnosis not present

## 2019-02-01 DIAGNOSIS — M80021D Age-related osteoporosis with current pathological fracture, right humerus, subsequent encounter for fracture with routine healing: Secondary | ICD-10-CM | POA: Diagnosis not present

## 2019-02-01 DIAGNOSIS — F411 Generalized anxiety disorder: Secondary | ICD-10-CM | POA: Diagnosis not present

## 2019-02-01 DIAGNOSIS — K589 Irritable bowel syndrome without diarrhea: Secondary | ICD-10-CM | POA: Diagnosis not present

## 2019-02-01 DIAGNOSIS — M5030 Other cervical disc degeneration, unspecified cervical region: Secondary | ICD-10-CM | POA: Diagnosis not present

## 2019-02-02 DIAGNOSIS — M0609 Rheumatoid arthritis without rheumatoid factor, multiple sites: Secondary | ICD-10-CM | POA: Diagnosis not present

## 2019-02-04 DIAGNOSIS — I10 Essential (primary) hypertension: Secondary | ICD-10-CM | POA: Diagnosis not present

## 2019-02-04 DIAGNOSIS — K589 Irritable bowel syndrome without diarrhea: Secondary | ICD-10-CM | POA: Diagnosis not present

## 2019-02-04 DIAGNOSIS — M5030 Other cervical disc degeneration, unspecified cervical region: Secondary | ICD-10-CM | POA: Diagnosis not present

## 2019-02-04 DIAGNOSIS — M0600 Rheumatoid arthritis without rheumatoid factor, unspecified site: Secondary | ICD-10-CM | POA: Diagnosis not present

## 2019-02-04 DIAGNOSIS — F411 Generalized anxiety disorder: Secondary | ICD-10-CM | POA: Diagnosis not present

## 2019-02-04 DIAGNOSIS — M80021D Age-related osteoporosis with current pathological fracture, right humerus, subsequent encounter for fracture with routine healing: Secondary | ICD-10-CM | POA: Diagnosis not present

## 2019-02-09 DIAGNOSIS — F411 Generalized anxiety disorder: Secondary | ICD-10-CM | POA: Diagnosis not present

## 2019-02-09 DIAGNOSIS — M80021D Age-related osteoporosis with current pathological fracture, right humerus, subsequent encounter for fracture with routine healing: Secondary | ICD-10-CM | POA: Diagnosis not present

## 2019-02-09 DIAGNOSIS — I10 Essential (primary) hypertension: Secondary | ICD-10-CM | POA: Diagnosis not present

## 2019-02-09 DIAGNOSIS — M0600 Rheumatoid arthritis without rheumatoid factor, unspecified site: Secondary | ICD-10-CM | POA: Diagnosis not present

## 2019-02-09 DIAGNOSIS — K589 Irritable bowel syndrome without diarrhea: Secondary | ICD-10-CM | POA: Diagnosis not present

## 2019-02-09 DIAGNOSIS — M5030 Other cervical disc degeneration, unspecified cervical region: Secondary | ICD-10-CM | POA: Diagnosis not present

## 2019-02-16 DIAGNOSIS — I10 Essential (primary) hypertension: Secondary | ICD-10-CM | POA: Diagnosis not present

## 2019-02-16 DIAGNOSIS — M0600 Rheumatoid arthritis without rheumatoid factor, unspecified site: Secondary | ICD-10-CM | POA: Diagnosis not present

## 2019-02-16 DIAGNOSIS — K589 Irritable bowel syndrome without diarrhea: Secondary | ICD-10-CM | POA: Diagnosis not present

## 2019-02-16 DIAGNOSIS — M5030 Other cervical disc degeneration, unspecified cervical region: Secondary | ICD-10-CM | POA: Diagnosis not present

## 2019-02-16 DIAGNOSIS — M80021D Age-related osteoporosis with current pathological fracture, right humerus, subsequent encounter for fracture with routine healing: Secondary | ICD-10-CM | POA: Diagnosis not present

## 2019-02-16 DIAGNOSIS — F411 Generalized anxiety disorder: Secondary | ICD-10-CM | POA: Diagnosis not present

## 2019-02-19 DIAGNOSIS — Z23 Encounter for immunization: Secondary | ICD-10-CM | POA: Diagnosis not present

## 2019-02-23 DIAGNOSIS — K219 Gastro-esophageal reflux disease without esophagitis: Secondary | ICD-10-CM | POA: Diagnosis not present

## 2019-02-23 DIAGNOSIS — Z9181 History of falling: Secondary | ICD-10-CM | POA: Diagnosis not present

## 2019-02-23 DIAGNOSIS — M5030 Other cervical disc degeneration, unspecified cervical region: Secondary | ICD-10-CM | POA: Diagnosis not present

## 2019-02-23 DIAGNOSIS — I10 Essential (primary) hypertension: Secondary | ICD-10-CM | POA: Diagnosis not present

## 2019-02-23 DIAGNOSIS — K589 Irritable bowel syndrome without diarrhea: Secondary | ICD-10-CM | POA: Diagnosis not present

## 2019-02-23 DIAGNOSIS — M80021D Age-related osteoporosis with current pathological fracture, right humerus, subsequent encounter for fracture with routine healing: Secondary | ICD-10-CM | POA: Diagnosis not present

## 2019-02-23 DIAGNOSIS — M0600 Rheumatoid arthritis without rheumatoid factor, unspecified site: Secondary | ICD-10-CM | POA: Diagnosis not present

## 2019-02-23 DIAGNOSIS — Z86010 Personal history of colonic polyps: Secondary | ICD-10-CM | POA: Diagnosis not present

## 2019-02-23 DIAGNOSIS — F411 Generalized anxiety disorder: Secondary | ICD-10-CM | POA: Diagnosis not present

## 2019-02-23 DIAGNOSIS — D649 Anemia, unspecified: Secondary | ICD-10-CM | POA: Diagnosis not present

## 2019-02-23 DIAGNOSIS — Z86718 Personal history of other venous thrombosis and embolism: Secondary | ICD-10-CM | POA: Diagnosis not present

## 2019-02-23 DIAGNOSIS — G4700 Insomnia, unspecified: Secondary | ICD-10-CM | POA: Diagnosis not present

## 2019-02-23 DIAGNOSIS — E538 Deficiency of other specified B group vitamins: Secondary | ICD-10-CM | POA: Diagnosis not present

## 2019-03-03 DIAGNOSIS — F411 Generalized anxiety disorder: Secondary | ICD-10-CM | POA: Diagnosis not present

## 2019-03-03 DIAGNOSIS — M5030 Other cervical disc degeneration, unspecified cervical region: Secondary | ICD-10-CM | POA: Diagnosis not present

## 2019-03-03 DIAGNOSIS — M80021D Age-related osteoporosis with current pathological fracture, right humerus, subsequent encounter for fracture with routine healing: Secondary | ICD-10-CM | POA: Diagnosis not present

## 2019-03-03 DIAGNOSIS — I10 Essential (primary) hypertension: Secondary | ICD-10-CM | POA: Diagnosis not present

## 2019-03-03 DIAGNOSIS — K589 Irritable bowel syndrome without diarrhea: Secondary | ICD-10-CM | POA: Diagnosis not present

## 2019-03-03 DIAGNOSIS — M0600 Rheumatoid arthritis without rheumatoid factor, unspecified site: Secondary | ICD-10-CM | POA: Diagnosis not present

## 2019-03-09 DIAGNOSIS — F411 Generalized anxiety disorder: Secondary | ICD-10-CM | POA: Diagnosis not present

## 2019-03-09 DIAGNOSIS — I10 Essential (primary) hypertension: Secondary | ICD-10-CM | POA: Diagnosis not present

## 2019-03-09 DIAGNOSIS — K589 Irritable bowel syndrome without diarrhea: Secondary | ICD-10-CM | POA: Diagnosis not present

## 2019-03-09 DIAGNOSIS — M0600 Rheumatoid arthritis without rheumatoid factor, unspecified site: Secondary | ICD-10-CM | POA: Diagnosis not present

## 2019-03-09 DIAGNOSIS — M80021D Age-related osteoporosis with current pathological fracture, right humerus, subsequent encounter for fracture with routine healing: Secondary | ICD-10-CM | POA: Diagnosis not present

## 2019-03-09 DIAGNOSIS — M5030 Other cervical disc degeneration, unspecified cervical region: Secondary | ICD-10-CM | POA: Diagnosis not present

## 2019-03-10 DIAGNOSIS — Z79899 Other long term (current) drug therapy: Secondary | ICD-10-CM | POA: Diagnosis not present

## 2019-03-10 DIAGNOSIS — M0609 Rheumatoid arthritis without rheumatoid factor, multiple sites: Secondary | ICD-10-CM | POA: Diagnosis not present

## 2019-03-10 DIAGNOSIS — R7303 Prediabetes: Secondary | ICD-10-CM | POA: Diagnosis not present

## 2019-03-11 DIAGNOSIS — C801 Malignant (primary) neoplasm, unspecified: Secondary | ICD-10-CM | POA: Diagnosis not present

## 2019-03-11 DIAGNOSIS — K449 Diaphragmatic hernia without obstruction or gangrene: Secondary | ICD-10-CM | POA: Diagnosis not present

## 2019-03-11 DIAGNOSIS — R194 Change in bowel habit: Secondary | ICD-10-CM | POA: Diagnosis not present

## 2019-03-11 DIAGNOSIS — K2950 Unspecified chronic gastritis without bleeding: Secondary | ICD-10-CM | POA: Diagnosis not present

## 2019-03-16 DIAGNOSIS — I10 Essential (primary) hypertension: Secondary | ICD-10-CM | POA: Diagnosis not present

## 2019-03-16 DIAGNOSIS — M5030 Other cervical disc degeneration, unspecified cervical region: Secondary | ICD-10-CM | POA: Diagnosis not present

## 2019-03-16 DIAGNOSIS — F411 Generalized anxiety disorder: Secondary | ICD-10-CM | POA: Diagnosis not present

## 2019-03-16 DIAGNOSIS — M0600 Rheumatoid arthritis without rheumatoid factor, unspecified site: Secondary | ICD-10-CM | POA: Diagnosis not present

## 2019-03-16 DIAGNOSIS — M80021D Age-related osteoporosis with current pathological fracture, right humerus, subsequent encounter for fracture with routine healing: Secondary | ICD-10-CM | POA: Diagnosis not present

## 2019-03-16 DIAGNOSIS — K589 Irritable bowel syndrome without diarrhea: Secondary | ICD-10-CM | POA: Diagnosis not present

## 2019-03-18 DIAGNOSIS — M810 Age-related osteoporosis without current pathological fracture: Secondary | ICD-10-CM | POA: Diagnosis not present

## 2019-03-18 DIAGNOSIS — R3129 Other microscopic hematuria: Secondary | ICD-10-CM | POA: Diagnosis not present

## 2019-03-18 DIAGNOSIS — M0609 Rheumatoid arthritis without rheumatoid factor, multiple sites: Secondary | ICD-10-CM | POA: Diagnosis not present

## 2019-03-23 DIAGNOSIS — M80021D Age-related osteoporosis with current pathological fracture, right humerus, subsequent encounter for fracture with routine healing: Secondary | ICD-10-CM | POA: Diagnosis not present

## 2019-03-23 DIAGNOSIS — M5030 Other cervical disc degeneration, unspecified cervical region: Secondary | ICD-10-CM | POA: Diagnosis not present

## 2019-03-23 DIAGNOSIS — I10 Essential (primary) hypertension: Secondary | ICD-10-CM | POA: Diagnosis not present

## 2019-03-23 DIAGNOSIS — K589 Irritable bowel syndrome without diarrhea: Secondary | ICD-10-CM | POA: Diagnosis not present

## 2019-03-23 DIAGNOSIS — M0600 Rheumatoid arthritis without rheumatoid factor, unspecified site: Secondary | ICD-10-CM | POA: Diagnosis not present

## 2019-03-23 DIAGNOSIS — F411 Generalized anxiety disorder: Secondary | ICD-10-CM | POA: Diagnosis not present

## 2019-03-25 DIAGNOSIS — Z85828 Personal history of other malignant neoplasm of skin: Secondary | ICD-10-CM | POA: Diagnosis not present

## 2019-03-25 DIAGNOSIS — L821 Other seborrheic keratosis: Secondary | ICD-10-CM | POA: Diagnosis not present

## 2019-03-25 DIAGNOSIS — Z08 Encounter for follow-up examination after completed treatment for malignant neoplasm: Secondary | ICD-10-CM | POA: Diagnosis not present

## 2019-03-29 DIAGNOSIS — S42291D Other displaced fracture of upper end of right humerus, subsequent encounter for fracture with routine healing: Secondary | ICD-10-CM | POA: Diagnosis not present

## 2019-03-31 DIAGNOSIS — M79675 Pain in left toe(s): Secondary | ICD-10-CM | POA: Diagnosis not present

## 2019-03-31 DIAGNOSIS — M898X7 Other specified disorders of bone, ankle and foot: Secondary | ICD-10-CM | POA: Diagnosis not present

## 2019-03-31 DIAGNOSIS — M2012 Hallux valgus (acquired), left foot: Secondary | ICD-10-CM | POA: Diagnosis not present

## 2019-04-06 DIAGNOSIS — M0609 Rheumatoid arthritis without rheumatoid factor, multiple sites: Secondary | ICD-10-CM | POA: Diagnosis not present

## 2019-04-19 DIAGNOSIS — M810 Age-related osteoporosis without current pathological fracture: Secondary | ICD-10-CM | POA: Diagnosis not present

## 2019-06-01 ENCOUNTER — Ambulatory Visit: Payer: Medicare Other

## 2019-07-15 ENCOUNTER — Ambulatory Visit: Payer: Medicare Other | Admitting: Occupational Therapy

## 2019-07-22 ENCOUNTER — Other Ambulatory Visit: Payer: Self-pay

## 2019-07-22 ENCOUNTER — Ambulatory Visit: Payer: Medicare Other | Attending: Rheumatology | Admitting: Occupational Therapy

## 2019-07-22 ENCOUNTER — Encounter: Payer: Self-pay | Admitting: Occupational Therapy

## 2019-07-22 DIAGNOSIS — M6281 Muscle weakness (generalized): Secondary | ICD-10-CM | POA: Diagnosis present

## 2019-07-22 DIAGNOSIS — M25631 Stiffness of right wrist, not elsewhere classified: Secondary | ICD-10-CM

## 2019-07-22 DIAGNOSIS — M25531 Pain in right wrist: Secondary | ICD-10-CM | POA: Insufficient documentation

## 2019-07-22 NOTE — Therapy (Signed)
[facility] [address], [place], [address] Phone: [phone]   Fax:  [phone]  Occupational Therapy Evaluation  Patient Details  Name: Teresa Lin MRN: [id number] Date of Birth: July 12, 1931 Referring Provider (OT): Jefm Bryant    Encounter Date: 07/22/2019  OT End of Session - 07/22/19 0855    Visit Number  1    Number of Visits  6    Date for OT Re-Evaluation  09/02/19    OT Start Time  0803    OT Stop Time  0855    OT Time Calculation (min)  52 min    Activity Tolerance  Patient tolerated treatment well    Behavior During Therapy  [facility] for tasks assessed/performed       Past Medical History:  Diagnosis Date  . Anemia   . Anxiety   . Arthritis    osteoarthritis. Bilateral knee replacement, hands, meniscal tear, cervical disc disease  . Atherosclerosis   . Atypical chest pain   . Collagen vascular disease ([place])   . Colon polyp 12/15/2017  . Colon polyps   . Compression fracture of L2 ([place])   . Diverticulosis   . DVT (deep venous thrombosis) ([place])   . GERD (gastroesophageal reflux disease)   . History of seronegative inflammatory arthritis   . Hypertension   . IBS (irritable bowel syndrome)   . Internal hemorrhoids   . Phlebitis and thrombophlebitis of deep veins of lower extremities (HCC)    after surgery  . Vision abnormalities     Past Surgical History:  Procedure Laterality Date  . APPENDECTOMY    . COLONOSCOPY WITH PROPOFOL N/A 10/24/2017   Procedure: COLONOSCOPY WITH PROPOFOL;  Surgeon: Manya Silvas, MD;  Location: [facility] ENDOSCOPY;  Service: Endoscopy;  Laterality: N/A;  . COLONOSCOPY WITH PROPOFOL N/A 11/21/2017   Procedure: COLONOSCOPY WITH PROPOFOL;  Surgeon: Manya Silvas, MD;  Location: [facility] ENDOSCOPY;  Service: Endoscopy;  Laterality: N/A;  . DILATION AND CURETTAGE OF UTERUS    . ESOPHAGOGASTRODUODENOSCOPY (EGD) WITH PROPOFOL N/A 02/25/2018   Procedure:  ESOPHAGOGASTRODUODENOSCOPY (EGD) WITH PROPOFOL;  Surgeon: Manya Silvas, MD;  Location: [facility] ENDOSCOPY;  Service: Endoscopy;  Laterality: N/A;  . EYE SURGERY     bilateral catarACT  . JOINT REPLACEMENT Bilateral    knee replacement  . LAPAROSCOPIC RIGHT COLECTOMY Right 12/15/2017   Procedure: LAPAROSCOPIC RIGHT COLECTOMY;  Surgeon: Robert Bellow, MD;  Location: [facility] ORS;  Service: General;  Laterality: Right;  . REPLACEMENT TOTAL KNEE BILATERAL    . TOTAL SHOULDER ARTHROPLASTY Right 09/24/2018   Procedure: TOTAL SHOULDER ARTHROPLASTY - REVERSE RIGHT;  Surgeon: Corky Mull, MD;  Location: [facility] ORS;  Service: Orthopedics;  Laterality: Right;    There were no vitals filed for this visit.  Subjective Assessment - 07/22/19 0809    Subjective   Started 3 wks ago, hands little weaker and wrist hurting - pain mainly in R wrist - I am R handed -hard time  writing , opening jars, doorknobs, carrying , pushing up , hygien    Pertinent History  Pt has long standing history of RA- on Orencia infusion monthly and every week metratrexate - flareup happened after her Covid shot - last 3 wks harder time with pain and stiffness in R wrist still - decrease use of R hand , pain -refer to hard therapy    Patient Stated Goals  I want to be able to get the pain better and  keep the use of my hands and wrist    Currently in Pain?  Yes    Pain Score  5     Pain Location  Wrist    Pain Orientation  Right    Pain Descriptors / Indicators  Aching;Tightness    Pain Type  Chronic pain    Pain Onset  1 to 4 weeks ago    Pain Frequency  Intermittent    Aggravating Factors   moving wrist , pushing up        [facility] OT Assessment - 07/22/19 0001      Assessment   Medical Diagnosis  bilateral hand, wrist pain , imflammatory arthritis     Referring Provider (OT)  Jefm Bryant     Onset Date/Surgical Date  07/01/19    Hand Dominance  Right    Prior Therapy  --   06/2018     Home  Environment   Lives With  Alone       Prior Function   Vocation  Retired    Leisure  Retired Pharmacist, hospital , likes to work in yard, travel , read books, card games , do own house work       AROM   Right Wrist Extension  32 Degrees    Right Wrist Flexion  45 Degrees    Right Wrist Radial Deviation  5 Degrees    Right Wrist Ulnar Deviation  20 Degrees    Left Wrist Extension  58 Degrees    Left Wrist Flexion  85 Degrees    Left Wrist Radial Deviation  15 Degrees    Left Wrist Ulnar Deviation  25 Degrees      Strength   Right Hand Grip (lbs)  26    Right Hand Lateral Pinch  12 lbs    Right Hand 3 Point Pinch  10 lbs    Left Hand Grip (lbs)  40    Left Hand Lateral Pinch  12 lbs    Left Hand 3 Point Pinch  11 lbs      Right Hand AROM   R Index  MCP 0-90  80 Degrees    R Index PIP 0-100  90 Degrees    R Long  MCP 0-90  90 Degrees    R Long PIP 0-100  50 Degrees    R Ring  MCP 0-90  90 Degrees    R Ring PIP 0-100  95 Degrees    R Little  MCP 0-90  90 Degrees    R Little PIP 0-100  95 Degrees      Left Hand AROM   L Index  MCP 0-90  70 Degrees    L Index PIP 0-100  100 Degrees    L Long  MCP 0-90  90 Degrees    L Long PIP 0-100  100 Degrees    L Ring  MCP 0-90  90 Degrees    L Ring PIP 0-100  100 Degrees    L Little  MCP 0-90  90 Degrees    L Little PIP 0-100  100 Degrees          fluidotherapy done with Wrist AROM in all planes - had decrease pain and increase wrist flexion more than extention   Review HEP - and hand out provided      Moist heat- like water to move in  AROM and AAROM for wrist flexion , ext, RD, UD  10 reps Pain less than 2/10  And SUp/pro  AROM 10 reps  2-3 x day       OT Education - 07/22/19 0855    Education provided  Yes    Education Details  findings of eval and HEP    Person(s) Educated  Patient    Methods  Explanation;Demonstration;Handout    Comprehension  Verbalized understanding;Returned demonstration       OT Short Term Goals - 07/22/19 0858      OT SHORT  TERM GOAL #1   Title  Pt to be independent in HEP to increase AROM in R wrist and whle decrease pain    Baseline  no knowledge of HEP and pain 6/10 with AROM R wrist    Time  3    Period  Weeks    Status  New    Target Date  08/12/19        OT Long Term Goals - 07/22/19 0859      OT LONG TERM GOAL #1   Title  Pt verbalize 3 joint protection and AE she is using to decrease pain in hands and wrist to increase function on PRWHE by 10 points    Baseline  not using - increase collapsing of CMC at L hand - REview again -function score on PRWHE 26/50    Time  6    Status  New    Target Date  09/02/19      OT LONG TERM GOAL #2   Title  Pt show increase AROM in R wrist and grip strength for hygiene , turning doorknob , push up and carry 8 lbs without increase pain    Baseline  pain increase 6/10 - cannot carry groceries , turn doorknob, hygiene, push up from chair    Time  6    Period  Weeks    Status  New    Target Date  09/02/19            Plan - 07/22/19 0856    Clinical Impression Statement  Pt refer to OT with diagnosis of long standing RA- had flareaup reasonly - with increase pain and stiffness mostly in R wrist-compare to measurents taken about year ago - her grip strength decrease also in R hand - pain increase to 6/10 with AROM - increase difficutly of R hand and wrist use in ADLs and IADL's    OT Occupational Profile and History  Problem Focused Assessment - Including review of records relating to presenting problem    Occupational performance deficits (Please refer to evaluation for details):  ADL's;IADL's;Play;Leisure    Body Structure / Function / Physical Skills  ADL;ROM;Flexibility;UE functional use;Pain;IADL    Rehab Potential  Fair    Clinical Decision Making  Limited treatment options, no task modification necessary    Comorbidities Affecting Occupational Performance:  May have comorbidities impacting occupational performance   RA - chronic condition    Modification or Assistance to Complete Evaluation   No modification of tasks or assist necessary to complete eval    OT Frequency  1x / week    OT Duration  6 weeks    OT Treatment/Interventions  Therapeutic exercise;Self-care/ADL training;DME and/or AE instruction;Manual Therapy;Paraffin;Patient/family education;Fluidtherapy;Contrast Bath;Ultrasound;Splinting    Plan  assess progress with homeprogram and modify as needed     OT Home Exercise Plan  see pt instruction     Consulted and Agree with Plan of Care  Patient       Patient will benefit from skilled therapeutic intervention in order to improve the  following deficits and impairments:   Body Structure / Function / Physical Skills: ADL, ROM, Flexibility, UE functional use, Pain, IADL       Visit Diagnosis: Pain in right wrist - Plan: Ot plan of care cert/re-cert  Stiffness of right wrist, not elsewhere classified - Plan: Ot plan of care cert/re-cert  Muscle weakness (generalized) - Plan: Ot plan of care cert/re-cert    Problem List Patient Active Problem List   Diagnosis Date Noted  . Status post reverse total shoulder replacement, right 09/24/2018  . S/P right colectomy 01/26/2018  . Adenomatous polyp of ascending colon 11/24/2017  . Generalized anxiety disorder 09/06/2017  . Irritable bowel syndrome (IBS) 09/06/2017  . Fracture of one rib, right side, subsequent encounter for fracture with routine healing 03/08/2017  . Orthostasis 06/05/2016  . Left shoulder pain 10/13/2015  . Constipation 05/30/2015  . Osteoporosis, postmenopausal 05/30/2015  . Compression fracture of L2 lumbar vertebra with delayed healing 05/29/2015  . Frequent falls 03/26/2015  . Atherosclerosis of arteries 03/26/2015  . Chest pain 02/09/2015  . Breast cancer screening 10/01/2014  . Family history of colon cancer 09/28/2014  . Viral URI with cough 06/12/2014  . Tenesmus (rectal) 06/12/2014  . TMJ (temporomandibular joint syndrome) 06/12/2014   . Varicose veins of both lower extremities without ulcer or inflammation 06/12/2014  . B12 deficiency 09/20/2013  . Fatigue 05/23/2013  . Internal hemorrhoids without complication [id number]  . Insomnia 03/03/2013  . Vertigo due to cerebrovascular disease 03/03/2013  . Seronegative rheumatoid arthritis affecting lower leg ([place]) 03/03/2013    Rosalyn Gess OTR/L,CLT 07/22/2019, 9:07 AM  [facility] [address] [place], [address] Phone: [phone]   Fax:  [phone]  Name: Teresa Lin MRN: [id number] Date of Birth: March 31, 1932

## 2019-07-22 NOTE — Patient Instructions (Signed)
Moist heat- like water to move in  AROM and AAROM for wrist flexion , ext, RD, UD  10 reps Pain less than 2/10  And SUp/pro AROM 10 reps  2-3 x day

## 2019-07-30 ENCOUNTER — Ambulatory Visit: Payer: Medicare Other | Admitting: Occupational Therapy

## 2019-08-02 ENCOUNTER — Encounter: Payer: Medicare Other | Admitting: Occupational Therapy

## 2019-08-04 ENCOUNTER — Encounter: Payer: Medicare Other | Admitting: Occupational Therapy

## 2020-01-05 IMAGING — CR DG ABDOMEN 1V
1 series · 1 of 1 positions shown · non-contrast
Comparison: 02/10/2015

CLINICAL DATA: Post biopsy of colon polyp

EXAM:
ABDOMEN - 1 VIEW

[t abdomen supine]
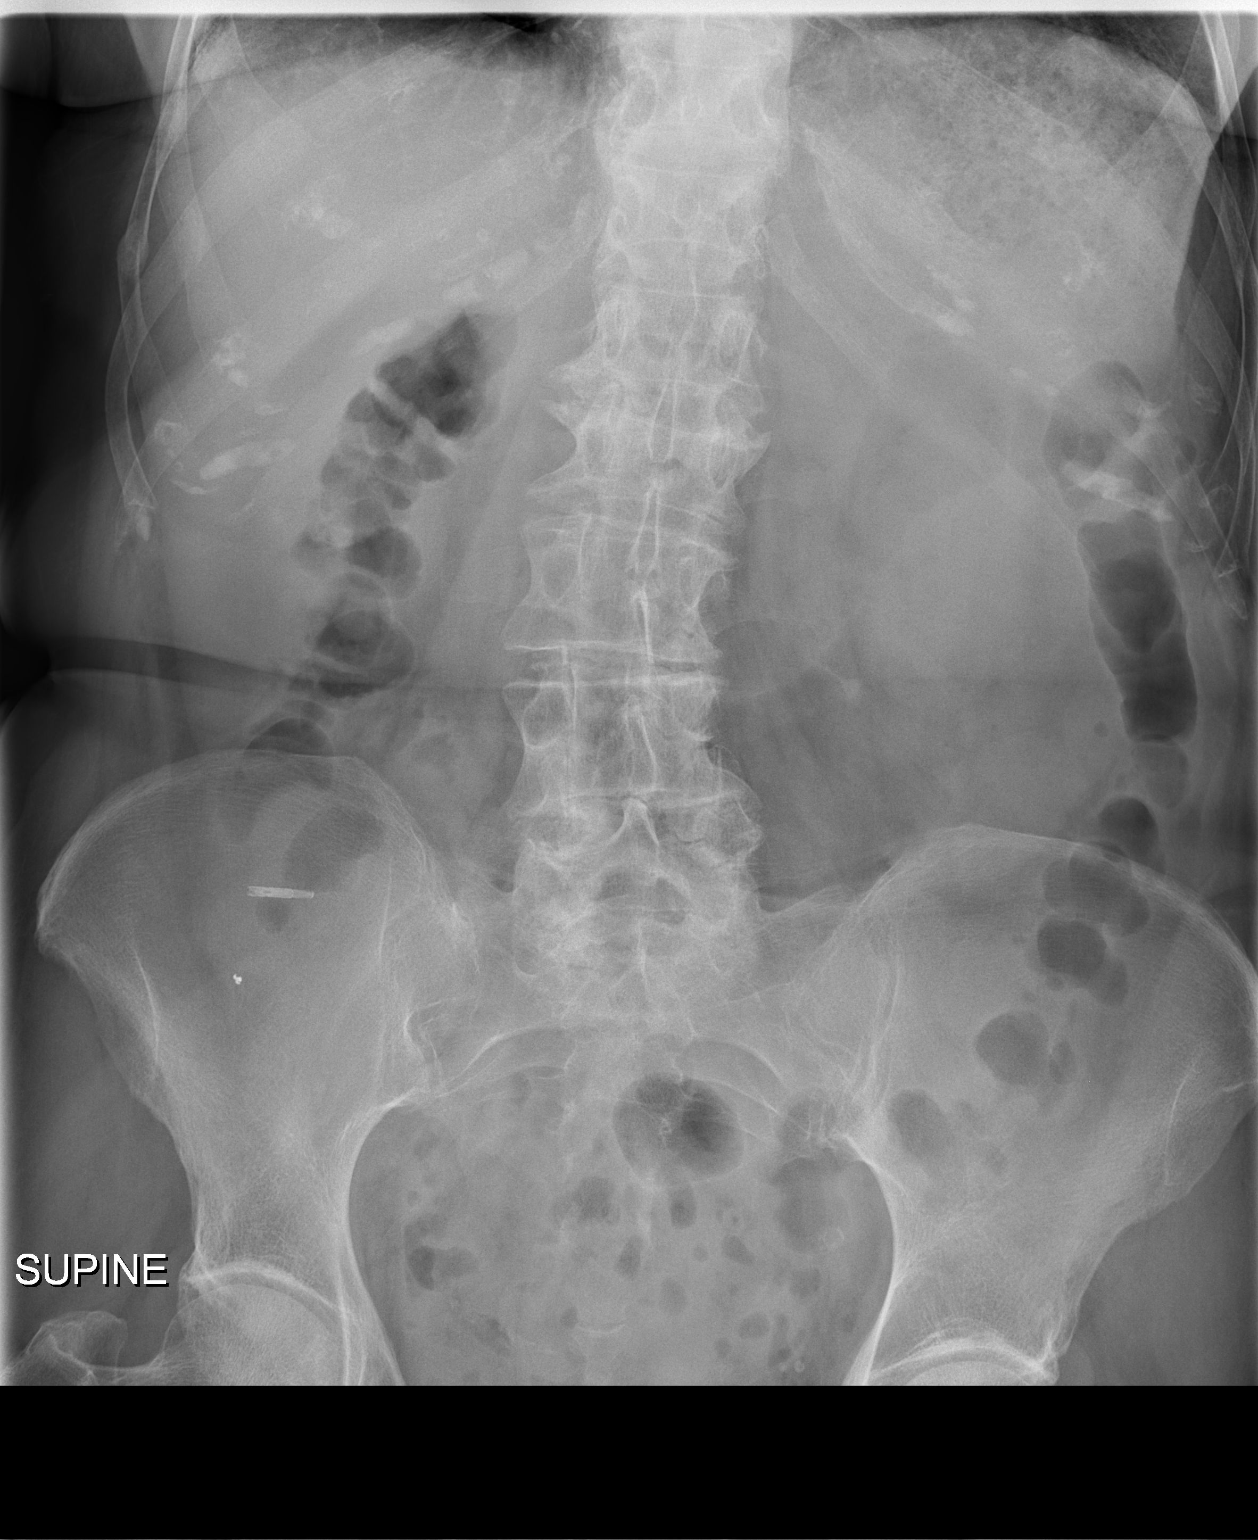

[1 of 1 positions shown; findings below may reference images not displayed]

FINDINGS: A clip projects over the proximal ascending colon in the right lower
quadrant. There are no disproportionally dilated loops of bowel.
There is no obvious free intraperitoneal gas.
IMPRESSION: An endoscopic clip projects over the proximal ascending colon.

Nonobstructive bowel gas pattern.

## 2020-01-12 DIAGNOSIS — Z862 Personal history of diseases of the blood and blood-forming organs and certain disorders involving the immune mechanism: Secondary | ICD-10-CM | POA: Insufficient documentation

## 2020-01-20 ENCOUNTER — Other Ambulatory Visit: Payer: Self-pay

## 2020-01-20 ENCOUNTER — Other Ambulatory Visit
Admission: RE | Admit: 2020-01-20 | Discharge: 2020-01-20 | Disposition: A | Discharge status: Home / Self Care | Payer: Medicare Other | Source: Ambulatory Visit | Attending: Gastroenterology | Admitting: Gastroenterology

## 2020-01-20 DIAGNOSIS — Z20822 Contact with and (suspected) exposure to covid-19: Secondary | ICD-10-CM | POA: Insufficient documentation

## 2020-01-20 DIAGNOSIS — Z01812 Encounter for preprocedural laboratory examination: Secondary | ICD-10-CM | POA: Diagnosis present

## 2020-01-21 LAB — SARS CORONAVIRUS 2 (TAT 6-24 HRS): SARS Coronavirus 2: NEGATIVE

## 2020-01-24 ENCOUNTER — Ambulatory Visit: Payer: Medicare Other | Admitting: Anesthesiology

## 2020-01-24 ENCOUNTER — Ambulatory Visit
Admission: RE | Admit: 2020-01-24 | Discharge: 2020-01-24 | Disposition: A | Discharge status: Home / Self Care | Payer: Medicare Other | Attending: Gastroenterology | Admitting: Gastroenterology

## 2020-01-24 ENCOUNTER — Encounter
Admission: RE | Disposition: A | Discharge status: Home / Self Care | Payer: Self-pay | Source: Home / Self Care | Attending: Gastroenterology

## 2020-01-24 ENCOUNTER — Other Ambulatory Visit: Payer: Self-pay

## 2020-01-24 ENCOUNTER — Encounter: Payer: Self-pay | Admitting: *Deleted

## 2020-01-24 DIAGNOSIS — R194 Change in bowel habit: Secondary | ICD-10-CM | POA: Diagnosis not present

## 2020-01-24 DIAGNOSIS — Z980 Intestinal bypass and anastomosis status: Secondary | ICD-10-CM | POA: Diagnosis not present

## 2020-01-24 DIAGNOSIS — Z800 Family history of malignant neoplasm of digestive organs: Secondary | ICD-10-CM | POA: Insufficient documentation

## 2020-01-24 DIAGNOSIS — Z7983 Long term (current) use of bisphosphonates: Secondary | ICD-10-CM | POA: Insufficient documentation

## 2020-01-24 DIAGNOSIS — Z85038 Personal history of other malignant neoplasm of large intestine: Secondary | ICD-10-CM | POA: Insufficient documentation

## 2020-01-24 DIAGNOSIS — Z86718 Personal history of other venous thrombosis and embolism: Secondary | ICD-10-CM | POA: Diagnosis not present

## 2020-01-24 DIAGNOSIS — I10 Essential (primary) hypertension: Secondary | ICD-10-CM | POA: Diagnosis not present

## 2020-01-24 DIAGNOSIS — M1990 Unspecified osteoarthritis, unspecified site: Secondary | ICD-10-CM | POA: Insufficient documentation

## 2020-01-24 DIAGNOSIS — K640 First degree hemorrhoids: Secondary | ICD-10-CM | POA: Diagnosis not present

## 2020-01-24 DIAGNOSIS — Z79899 Other long term (current) drug therapy: Secondary | ICD-10-CM | POA: Diagnosis not present

## 2020-01-24 DIAGNOSIS — E119 Type 2 diabetes mellitus without complications: Secondary | ICD-10-CM | POA: Diagnosis not present

## 2020-01-24 DIAGNOSIS — Z96653 Presence of artificial knee joint, bilateral: Secondary | ICD-10-CM | POA: Insufficient documentation

## 2020-01-24 DIAGNOSIS — Z7982 Long term (current) use of aspirin: Secondary | ICD-10-CM | POA: Insufficient documentation

## 2020-01-24 DIAGNOSIS — K5730 Diverticulosis of large intestine without perforation or abscess without bleeding: Secondary | ICD-10-CM | POA: Insufficient documentation

## 2020-01-24 DIAGNOSIS — Z86010 Personal history of colonic polyps: Secondary | ICD-10-CM | POA: Diagnosis not present

## 2020-01-24 DIAGNOSIS — R1030 Lower abdominal pain, unspecified: Secondary | ICD-10-CM | POA: Diagnosis not present

## 2020-01-24 HISTORY — PX: COLONOSCOPY: SHX5424

## 2020-01-24 SURGERY — COLONOSCOPY
Anesthesia: General

## 2020-01-24 MED ORDER — PROPOFOL 500 MG/50ML IV EMUL
INTRAVENOUS | Status: DC | PRN
  Administered 2020-01-24: 80 ug/kg/min via INTRAVENOUS

## 2020-01-24 MED ORDER — PROPOFOL 500 MG/50ML IV EMUL
INTRAVENOUS | Status: AC
  Filled 2020-01-24: qty 50

## 2020-01-24 MED ORDER — SODIUM CHLORIDE 0.9 % IV SOLN: INTRAVENOUS | Status: DC

## 2020-01-24 NOTE — Anesthesia Postprocedure Evaluation (Signed)
Anesthesia Post Note  Patient: Teresa Lin  Procedure(s) Performed: COLONOSCOPY (N/A )  Patient location during evaluation: Endoscopy Anesthesia Type: General Level of consciousness: awake and alert Pain management: pain level controlled Vital Signs Assessment: post-procedure vital signs reviewed and stable Respiratory status: spontaneous breathing, nonlabored ventilation, respiratory function stable and patient connected to nasal cannula oxygen Cardiovascular status: blood pressure returned to baseline and stable Postop Assessment: no apparent nausea or vomiting Anesthetic complications: no   No complications documented.   Last Vitals:  Vitals:   01/24/20 0855 01/24/20 0905  BP: (!) 147/92 (!) 155/72  Pulse: 75 69  Resp: 18 15  Temp:    SpO2: 98% 99%    Last Pain:  Vitals:   01/24/20 0905  TempSrc:   PainSc: 0-No pain                 Arita Miss

## 2020-01-24 NOTE — Op Note (Addendum)
[facility] Gastroenterology Patient Name: Teresa Lin Procedure Date: 01/24/2020 8:04 AM MRN: [id number] Account #: [id number] Date of Birth: [birth year] Admit Type: Outpatient Age: 86 Room: [facility] ENDO ROOM 4 Gender: Female Note Status: Supervisor Override Procedure:             Colonoscopy Indications:           Lower abdominal pain, Family history of colon cancer                         in a first-degree relative before age 83 years,                         Personal history of malignant neoplasm of the colon,                         Change in bowel habits Providers:             Andrey Farmer MD, MD Referring MD:          Dion Body (Referring MD) Medicines:             Monitored Anesthesia Care Complications:         No immediate complications. Procedure:             Pre-Anesthesia Assessment:                        - Prior to the procedure, a History and Physical was                         performed, and patient medications and allergies were                         reviewed. The patient is competent. The risks and                         benefits of the procedure and the sedation options and                         risks were discussed with the patient. All questions                         were answered and informed consent was obtained.                         Patient identification and proposed procedure were                         verified by the physician, the nurse, the anesthetist                         and the technician in the endoscopy suite. Mental                         Status Examination: alert and oriented. Airway                         Examination: normal oropharyngeal airway and neck  mobility. Respiratory Examination: clear to                         auscultation. CV Examination: normal. Prophylactic                         Antibiotics: The patient does not require prophylactic                          antibiotics. Prior Anticoagulants: The patient has                         taken no previous anticoagulant or antiplatelet                         agents. ASA Grade Assessment: II - A patient with mild                         systemic disease. After reviewing the risks and                         benefits, the patient was deemed in satisfactory                         condition to undergo the procedure. The anesthesia                         plan was to use monitored anesthesia care (MAC).                         Immediately prior to administration of medications,                         the patient was re-assessed for adequacy to receive                         sedatives. The heart rate, respiratory rate, oxygen                         saturations, blood pressure, adequacy of pulmonary                         ventilation, and response to care were monitored                         throughout the procedure. The physical status of the                         patient was re-assessed after the procedure.                        After obtaining informed consent, the colonoscope was                         passed under direct vision. Throughout the procedure,                         the patient's blood pressure, pulse, and oxygen  saturations were monitored continuously. The                         Colonoscope was introduced through the anus and                         advanced to the the ileocolonic anastomosis. The                         colonoscopy was performed without difficulty. The                         patient tolerated the procedure well. The quality of                         the bowel preparation was evaluated using the BBPS                         [facility] Bowel Preparation Scale) with scores of: Right                         Colon = 3, Transverse Colon = 3 and Left Colon = 3                         (entire mucosa seen well with no residual staining,                          small fragments of stool or opaque liquid). The total                         BBPS score equals 9. Findings:      The perianal and digital rectal examinations were normal.      There was evidence of a prior surgical anastomosis in the cecum. This       was patent and was characterized by healthy appearing mucosa.      Scattered small-mouthed diverticula were found in the sigmoid colon.      Non-bleeding internal hemorrhoids were found during retroflexion. The       hemorrhoids were Grade I (internal hemorrhoids that do not prolapse).      The exam was otherwise without abnormality on direct and retroflexion       views.      Biopsies for histology were taken with a cold forceps from the entire       colon for evaluation of microscopic colitis. Impression:            - Patent surgical anastomosis, characterized by                         healthy appearing mucosa.                        - Diverticulosis in the sigmoid colon.                        - Non-bleeding internal hemorrhoids.                        - The examination was otherwise normal on direct and  retroflexion views.                        - Biopsies were taken with a cold forceps from the                         entire colon for evaluation of microscopic colitis. Recommendation:        - Discharge patient to home.                        - Resume previous diet.                        - Continue present medications.                        - Await pathology results.                        - Repeat colonoscopy in 3 years for surveillance if                         benefits outweigh risks.                        - Return to referring physician as previously                         scheduled. Procedure Code(s):     --- Professional ---                        [phone], Colonoscopy, flexible; diagnostic, including                         collection of specimen(s) by brushing or washing, when                          performed (separate procedure) Diagnosis Code(s):     --- Professional ---                        Z98.0, Intestinal bypass and anastomosis status                        K64.0, First degree hemorrhoids                        R10.30, Lower abdominal pain, unspecified                        Z80.0, Family history of malignant neoplasm of                         digestive organs                        Z85.038, Personal history of other malignant neoplasm                         of large intestine  R19.4, Change in bowel habit                        K57.30, Diverticulosis of large intestine without                         perforation or abscess without bleeding CPT copyright 2019 [employer]. All rights reserved. The codes documented in this report are preliminary and upon coder review may  be revised to meet current compliance requirements. Andrey Farmer, MD Andrey Farmer MD, MD 01/24/2020 8:38:38 AM Number of Addenda: 0 Note Initiated On: 01/24/2020 8:04 AM Scope Withdrawal Time: 0 hours 9 minutes 51 seconds  Total Procedure Duration: 0 hours 20 minutes 0 seconds  Estimated Blood Loss:  Estimated blood loss: none. Estimated blood loss was                         minimal.      [facility]

## 2020-01-24 NOTE — H&P (Signed)
Outpatient short stay form Pre-procedure 01/24/2020 8:00 AM Raylene Miyamoto MD, MPH  Primary Physician: Dr. Netty Starring  Reason for visit:  Surveillance  History of present illness:   86 y/o lady with history of advanced SSA in right colon requiring hemicolectomy here for surveillance colonoscopy. Polyp with no overt adenocarcinoma but could not be ruled out. No blood thinners. Mother with colon cancer in her 80's.   Current Facility-Administered Medications:  .  0.9 %  sodium chloride infusion, , Intravenous, Continuous, Kathyleen Radice, Hilton Cork, MD  Medications Prior to Admission  Medication Sig Dispense Refill Last Dose  . Abatacept (ORENCIA IV) Inject into the vein every 30 (thirty) days.   Past Week at Unknown time  . alendronate (FOSAMAX) 70 MG tablet Take 70 mg by mouth every Monday.    Past Month at Unknown time  . folic acid (FOLVITE) 1 MG tablet Take 2 mg by mouth daily.    01/23/2020 at Unknown time  . hydroxychloroquine (PLAQUENIL) 200 MG tablet Take 200 mg by mouth daily.   01/23/2020 at Unknown time  . aspirin EC 325 MG tablet Take 1 tablet (325 mg total) by mouth daily. (Patient not taking: Reported on 01/24/2020) 30 tablet 0 Not Taking at Unknown time  . calcium-vitamin D (OSCAL WITH D) 500-200 MG-UNIT per tablet Take 1 tablet by mouth daily.      . methotrexate 50 MG/2ML injection Inject 20 mg into the skin every Sunday.      . Multiple Vitamins-Minerals (MULTIVITAMIN WITH MINERALS) tablet Take 1 tablet by mouth daily. Centrum Silver     . Polyethyl Glycol-Propyl Glycol (SYSTANE) 0.4-0.3 % SOLN Place 1 drop into both eyes 3 (three) times daily as needed (for eye burning/irritation).        Allergies  Allergen Reactions  . Infliximab Other (See Comments)    Shaking, pain  . Synvisc [Hylan G-F 20] Swelling     Past Medical History:  Diagnosis Date  . Anemia   . Anxiety   . Arthritis    osteoarthritis. Bilateral knee replacement, hands, meniscal tear, cervical disc  disease  . Atherosclerosis   . Atypical chest pain   . Collagen vascular disease ([place])   . Colon polyp 12/15/2017  . Colon polyps   . Compression fracture of L2 ([place])   . Diverticulosis   . DVT (deep venous thrombosis) ([place])   . GERD (gastroesophageal reflux disease)   . History of seronegative inflammatory arthritis   . Hypertension   . IBS (irritable bowel syndrome)   . Internal hemorrhoids   . Phlebitis and thrombophlebitis of deep veins of lower extremities (HCC)    after surgery  . Vision abnormalities     Review of systems:  Otherwise negative.    Physical Exam  Gen: Alert, oriented. Appears stated age.  HEENT: [place]/AT. PERRLA. Lungs: no respiratory distress Abd: soft, benign, no masses. BS+ Ext: No edema. Pulses 2+    Planned procedures: Proceed with colonoscopy. The patient understands the nature of the planned procedure, indications, risks, alternatives and potential complications including but not limited to bleeding, infection, perforation, damage to internal organs and possible oversedation/side effects from anesthesia. The patient agrees and gives consent to proceed.  Please refer to procedure notes for findings, recommendations and patient disposition/instructions.     Raylene Miyamoto MD, MPH Gastroenterology 01/24/2020  8:00 AM

## 2020-01-24 NOTE — Anesthesia Preprocedure Evaluation (Signed)
Anesthesia Evaluation  Patient identified by MRN, date of birth, ID band Patient awake    Reviewed: Allergy & Precautions, H&P , NPO status , Patient's Chart, lab work & pertinent test results  History of Anesthesia Complications Negative for: history of anesthetic complications  Airway Mallampati: II  TM Distance: >3 FB Neck ROM: limited    Dental no notable dental hx. (+) Teeth Intact   Pulmonary neg pulmonary ROS, neg shortness of breath, neg sleep apnea, neg COPD, Patient abstained from smoking.Not current smoker,    Pulmonary exam normal breath sounds clear to auscultation       Cardiovascular Exercise Tolerance: Good hypertension, (-) angina(-) Past MI and (-) DOE  Rhythm:Regular Rate:Normal - Systolic murmurs    Neuro/Psych PSYCHIATRIC DISORDERS Anxiety negative neurological ROS     GI/Hepatic Neg liver ROS, GERD  Medicated and Controlled,  Endo/Other  diabetes, Type 2  Renal/GU      Musculoskeletal   Abdominal   Peds  Hematology negative hematology ROS (+)   Anesthesia Other Findings Past Medical History: No date: Anemia No date: Anxiety No date: Arthritis     Comment:  osteoarthritis. Bilateral knee replacement, hands,               meniscal tear, cervical disc disease No date: Atherosclerosis No date: Atypical chest pain No date: Collagen vascular disease ([place]) 12/15/2017: Colon polyp No date: Colon polyps No date: Compression fracture of L2 (HCC) No date: Diabetes mellitus without complication (HCC)     Comment:  pre-diabetes No date: Diverticulosis No date: DVT (deep venous thrombosis) (HCC) No date: GERD (gastroesophageal reflux disease) No date: History of seronegative inflammatory arthritis No date: Hypertension No date: IBS (irritable bowel syndrome) No date: Internal hemorrhoids No date: Phlebitis and thrombophlebitis of deep veins of lower  extremities ([place])     Comment:  after  surgery No date: Vision abnormalities  Past Surgical History: No date: APPENDECTOMY 10/24/2017: COLONOSCOPY WITH PROPOFOL; N/A     Comment:  Procedure: COLONOSCOPY WITH PROPOFOL;  Surgeon: Manya Silvas, MD;  Location: [facility] ENDOSCOPY;  Service:               Endoscopy;  Laterality: N/A; 11/21/2017: COLONOSCOPY WITH PROPOFOL; N/A     Comment:  Procedure: COLONOSCOPY WITH PROPOFOL;  Surgeon: Manya Silvas, MD;  Location: [facility] ENDOSCOPY;  Service:               Endoscopy;  Laterality: N/A; No date: DILATION AND CURETTAGE OF UTERUS 02/25/2018: ESOPHAGOGASTRODUODENOSCOPY (EGD) WITH PROPOFOL; N/A     Comment:  Procedure: ESOPHAGOGASTRODUODENOSCOPY (EGD) WITH               PROPOFOL;  Surgeon: Manya Silvas, MD;  Location:               [facility] ENDOSCOPY;  Service: Endoscopy;  Laterality: N/A; No date: EYE SURGERY     Comment:  bilateral catarACT No date: JOINT REPLACEMENT; Bilateral     Comment:  knee replacement 12/15/2017: LAPAROSCOPIC RIGHT COLECTOMY; Right     Comment:  Procedure: LAPAROSCOPIC RIGHT COLECTOMY;  Surgeon:               Robert Bellow, MD;  Location: [facility] ORS;  Service:               General;  Laterality: Right; No  date: REPLACEMENT TOTAL KNEE BILATERAL     Reproductive/Obstetrics negative OB ROS                             Anesthesia Physical  Anesthesia Plan  ASA: II  Anesthesia Plan: General   Post-op Pain Management:    Induction: Intravenous  PONV Risk Score and Plan: 3 and Ondansetron, Treatment may vary due to age or medical condition, Propofol infusion and TIVA  Airway Management Planned: Natural Airway  Additional Equipment: None  Intra-op Plan:   Post-operative Plan:   Informed Consent: I have reviewed the patients History and Physical, chart, labs and discussed the procedure including the risks, benefits and alternatives for the proposed anesthesia with the patient or authorized  representative who has indicated his/her understanding and acceptance.     Dental Advisory Given  Plan Discussed with: Anesthesiologist, CRNA and Surgeon  Anesthesia Plan Comments: (Discussed risks of anesthesia with patient, including possibility of difficulty with spontaneous ventilation under anesthesia necessitating airway intervention, PONV, and rare risks such as cardiac or respiratory or neurological events. Patient understands.)        Anesthesia Quick Evaluation

## 2020-01-24 NOTE — Interval H&P Note (Signed)
History and Physical Interval Note:  01/24/2020 8:04 AM  Teresa Lin  has presented today for surgery, with the diagnosis of PERSONAL HX.OF COLON POLYPS.  The various methods of treatment have been discussed with the patient and family. After consideration of risks, benefits and other options for treatment, the patient has consented to  Procedure(s): COLONOSCOPY (N/A) as a surgical intervention.  The patient's history has been reviewed, patient examined, no change in status, stable for surgery.  I have reviewed the patient's chart and labs.  Questions were answered to the patient's satisfaction.     Lesly Rubenstein  Ok to proceed with colonoscopy

## 2020-01-24 NOTE — Transfer of Care (Signed)
Immediate Anesthesia Transfer of Care Note  Patient: Teresa Lin  Procedure(s) Performed: COLONOSCOPY (N/A )  Patient Location: PACU and Endoscopy Unit  Anesthesia Type:General  Level of Consciousness: sedated  Airway & Oxygen Therapy: Patient Spontanous Breathing and Patient connected to nasal cannula oxygen  Post-op Assessment: Report given to RN and Post -op Vital signs reviewed and stable  Post vital signs: Reviewed and stable  Last Vitals:  Vitals Value Taken Time  BP 126/56 01/24/20 0836  Temp 36.3 C 01/24/20 0835  Pulse 74 01/24/20 0835  Resp 18 01/24/20 0835  SpO2 100 % 01/24/20 0835  Vitals shown include unvalidated device data.  Last Pain:  Vitals:   01/24/20 0835  TempSrc: Tympanic  PainSc: Asleep         Complications: No complications documented.

## 2020-01-25 ENCOUNTER — Encounter: Payer: Self-pay | Admitting: Gastroenterology

## 2020-01-25 LAB — SURGICAL PATHOLOGY

## 2020-02-01 ENCOUNTER — Other Ambulatory Visit: Payer: Self-pay

## 2020-02-01 ENCOUNTER — Emergency Department
Admission: EM | Admit: 2020-02-01 | Discharge: 2020-02-01 | Disposition: A | Discharge status: Home / Self Care | Payer: Medicare Other | Attending: Emergency Medicine | Admitting: Emergency Medicine

## 2020-02-01 ENCOUNTER — Emergency Department: Payer: Medicare Other

## 2020-02-01 DIAGNOSIS — R0789 Other chest pain: Secondary | ICD-10-CM | POA: Diagnosis not present

## 2020-02-01 DIAGNOSIS — Z96653 Presence of artificial knee joint, bilateral: Secondary | ICD-10-CM | POA: Insufficient documentation

## 2020-02-01 DIAGNOSIS — Z7982 Long term (current) use of aspirin: Secondary | ICD-10-CM | POA: Diagnosis not present

## 2020-02-01 DIAGNOSIS — I10 Essential (primary) hypertension: Secondary | ICD-10-CM | POA: Diagnosis not present

## 2020-02-01 DIAGNOSIS — Z86718 Personal history of other venous thrombosis and embolism: Secondary | ICD-10-CM | POA: Insufficient documentation

## 2020-02-01 LAB — BASIC METABOLIC PANEL
Anion gap: 9 (ref 5–15)
BUN: 13 mg/dL (ref 8–23)
CO2: 27 mmol/L (ref 22–32)
Calcium: 8.7 mg/dL — ABNORMAL LOW (ref 8.9–10.3)
Chloride: 100 mmol/L (ref 98–111)
Creatinine, Ser: 0.45 mg/dL (ref 0.44–1.00)
GFR calc Af Amer: >60 mL/min (ref >60)
GFR calc non Af Amer: >60 mL/min (ref >60)
Glucose, Bld: 112 mg/dL — ABNORMAL HIGH (ref 70–99)
Potassium: 3.7 mmol/L (ref 3.5–5.1)
Sodium: 136 mmol/L (ref 135–145)

## 2020-02-01 LAB — CBC
HCT: 42.1 % (ref 36.0–46.0)
Hemoglobin: 14 g/dL (ref 12.0–15.0)
MCH: 32.5 pg (ref 26.0–34.0)
MCHC: 33.3 g/dL (ref 30.0–36.0)
MCV: 97.7 fL (ref 80.0–100.0)
Platelets: 226 10*3/uL (ref 150–400)
RBC: 4.31 MIL/uL (ref 3.87–5.11)
RDW: 13.8 % (ref 11.5–15.5)
WBC: 10.6 10*3/uL — ABNORMAL HIGH (ref 4.0–10.5)
nRBC: 0 % (ref 0.0–0.2)

## 2020-02-01 LAB — TROPONIN I (HIGH SENSITIVITY)
Troponin I (High Sensitivity): 7 ng/L (ref <18)
Troponin I (High Sensitivity): 6 ng/L (ref <18)

## 2020-02-01 MED ORDER — ACETAMINOPHEN 325 MG PO TABS
650.0000 mg | ORAL_TABLET | Freq: Once | ORAL | Status: AC
  Administered 2020-02-01: 650 mg via ORAL
  Filled 2020-02-01: qty 2

## 2020-02-01 NOTE — ED Provider Notes (Addendum)
[facility] Emergency Department Provider Note  ____________________________________________  Time seen: Approximately 11:27 AM  I have reviewed the triage vital signs and the nursing notes.   HISTORY  Chief Complaint Chest Pain    HPI Teresa Lin is a 86 y.o. female with a history of DVT, hypertension, collagen vascular disease who comes ED complaining of left-sided chest pain, gradual onset yesterday, continuous and waxing and waning.  Associated with some nausea but no vomiting or diaphoresis, no dizziness or syncope, no shortness of breath.  No recent exertional symptoms.  Not pleuritic.  Current pain is not exertional.  Denies any falls or trauma.  Denies any forceful coughing.  Pain is in the left lower chest and feels like it radiates to the left shoulder.  Pain is worsened with movement of the left shoulder and lifting her arm.        Past Medical History:  Diagnosis Date  . Anemia   . Anxiety   . Arthritis    osteoarthritis. Bilateral knee replacement, hands, meniscal tear, cervical disc disease  . Atherosclerosis   . Atypical chest pain   . Collagen vascular disease ([place])   . Colon polyp 12/15/2017  . Colon polyps   . Compression fracture of L2 ([place])   . Diverticulosis   . DVT (deep venous thrombosis) ([place])   . GERD (gastroesophageal reflux disease)   . History of seronegative inflammatory arthritis   . Hypertension   . IBS (irritable bowel syndrome)   . Internal hemorrhoids   . Phlebitis and thrombophlebitis of deep veins of lower extremities (HCC)    after surgery  . Vision abnormalities      Patient Active Problem List   Diagnosis Date Noted  . Status post reverse total shoulder replacement, right 09/24/2018  . S/P right colectomy 01/26/2018  . Adenomatous polyp of ascending colon 11/24/2017  . Generalized anxiety disorder 09/06/2017  . Irritable bowel syndrome (IBS) 09/06/2017  . Fracture of one rib, right side, subsequent  encounter for fracture with routine healing 03/08/2017  . Orthostasis 06/05/2016  . Left shoulder pain 10/13/2015  . Constipation 05/30/2015  . Osteoporosis, postmenopausal 05/30/2015  . Compression fracture of L2 lumbar vertebra with delayed healing 05/29/2015  . Frequent falls 03/26/2015  . Atherosclerosis of arteries 03/26/2015  . Chest pain 02/09/2015  . Breast cancer screening 10/01/2014  . Family history of colon cancer 09/28/2014  . Viral URI with cough 06/12/2014  . Tenesmus (rectal) 06/12/2014  . TMJ (temporomandibular joint syndrome) 06/12/2014  . Varicose veins of both lower extremities without ulcer or inflammation 06/12/2014  . B12 deficiency 09/20/2013  . Fatigue 05/23/2013  . Internal hemorrhoids without complication 13/24/4010  . Insomnia 03/03/2013  . Vertigo due to cerebrovascular disease 03/03/2013  . Seronegative rheumatoid arthritis affecting lower leg ([place]) 03/03/2013     Past Surgical History:  Procedure Laterality Date  . APPENDECTOMY    . COLONOSCOPY N/A 01/24/2020   Procedure: COLONOSCOPY;  Surgeon: Lesly Rubenstein, MD;  Location: [facility] END[facility]COPY;  Service: Endoscopy;  Laterality: N/A;  . COLONOSCOPY WITH PROPOFOL N/A 10/24/2017   Procedure: COLONOSCOPY WITH PROPOFOL;  Surgeon: Manya Silvas, MD;  Location: [facility] ENDOSCOPY;  Service: Endoscopy;  Laterality: N/A;  . COLONOSCOPY WITH PROPOFOL N/A 11/21/2017   Procedure: COLONOSCOPY WITH PROPOFOL;  Surgeon: Manya Silvas, MD;  Location: [facility] ENDOSCOPY;  Service: Endoscopy;  Laterality: N/A;  . DILATION AND CURETTAGE OF UTERUS    . ESOPHAGOGASTRODUODENOSCOPY (EGD) WITH PROPOFOL N/A 02/25/2018  Procedure: ESOPHAGOGASTRODUODENOSCOPY (EGD) WITH PROPOFOL;  Surgeon: Manya Silvas, MD;  Location: [facility] ENDOSCOPY;  Service: Endoscopy;  Laterality: N/A;  . EYE SURGERY     bilateral catarACT  . JOINT REPLACEMENT Bilateral    knee replacement  . LAPAROSCOPIC RIGHT COLECTOMY Right 12/15/2017   Procedure:  LAPAROSCOPIC RIGHT COLECTOMY;  Surgeon: Robert Bellow, MD;  Location: [facility] ORS;  Service: General;  Laterality: Right;  . REPLACEMENT TOTAL KNEE BILATERAL    . TOTAL SHOULDER ARTHROPLASTY Right 09/24/2018   Procedure: TOTAL SHOULDER ARTHROPLASTY - REVERSE RIGHT;  Surgeon: Corky Mull, MD;  Location: [facility] ORS;  Service: Orthopedics;  Laterality: Right;     Prior to Admission medications   Medication Sig Start Date End Date Taking? Authorizing Provider  Abatacept (ORENCIA IV) Inject into the vein every 30 (thirty) days.    [provider]  alendronate (FOSAMAX) 70 MG tablet Take 70 mg by mouth every Monday.  06/14/15   [provider]  aspirin EC 325 MG tablet Take 1 tablet (325 mg total) by mouth daily. Patient not taking: Reported on 01/24/2020 09/24/18   Lattie Corns, PA-C  calcium-vitamin D (OSCAL WITH D) 500-200 MG-UNIT per tablet Take 1 tablet by mouth daily.     [provider]  folic acid (FOLVITE) 1 MG tablet Take 2 mg by mouth daily.     [provider]  hydroxychloroquine (PLAQUENIL) 200 MG tablet Take 200 mg by mouth daily.    [provider]  methotrexate 50 MG/2ML injection Inject 20 mg into the skin every Sunday.  03/16/15   [provider]  Multiple Vitamins-Minerals (MULTIVITAMIN WITH MINERALS) tablet Take 1 tablet by mouth daily. Centrum Silver    [provider]  Polyethyl Glycol-Propyl Glycol (SYSTANE) 0.4-0.3 % SOLN Place 1 drop into both eyes 3 (three) times daily as needed (for eye burning/irritation).    [provider]     Allergies Infliximab and Synvisc [hylan g-f 20]   Family History  Problem Relation Age of Onset  . Hypertension Mother   . Cancer Mother 67       colon ca  . Heart attack Mother   . Hypertension Father   . Heart attack Father   . Hypertension Sister   . Heart attack Sister   . Multiple sclerosis Daughter   . Multiple sclerosis Son   . Multiple sclerosis Son      Social History Social History   Tobacco Use  . Smoking status: Never Smoker  . Smokeless tobacco: Never Used  Vaping Use  . Vaping Use: Never used  Substance Use Topics  . Alcohol use: Yes    Alcohol/week: 2.0 standard drinks    Types: 2 Cans of beer per week    Comment: occasionally   . Drug use: No    Review of Systems  Constitutional:   No fever or chills.  ENT:   No sore throat. No rhinorrhea. Cardiovascular:   Positive chest pain as above without syncope. Respiratory:   No dyspnea or cough. Gastrointestinal:   Negative for abdominal pain, vomiting and diarrhea.  Musculoskeletal:   Negative for focal pain or swelling All other systems reviewed and are negative except as documented above in ROS and HPI.  ____________________________________________   PHYSICAL EXAM:  VITAL SIGNS: ED Triage Vitals  Enc Vitals Group     BP 02/01/20 0739 (!) 119/58     Pulse Rate 02/01/20 0739 92     Resp 02/01/20 0739 16  Temp 02/01/20 0740 98.3 F (36.8 C)     Temp Source 02/01/20 0739 Oral     SpO2 02/01/20 0739 97 %     Weight 02/01/20 0741 135 lb (61.2 kg)     Height 02/01/20 0741 5\' 6"  (1.676 m)     Head Circumference --      Peak Flow --      Pain Score 02/01/20 0739 6     Pain Loc --      Pain Edu? --      Excl. in [place]? --     Vital signs reviewed, nursing assessments reviewed.   Constitutional:   Alert and oriented. Non-toxic appearance. Eyes:   Conjunctivae are normal. EOMI. PERRL. ENT      Head:   Normocephalic and atraumatic.      Nose:   Wearing a mask.      Mouth/Throat:   Wearing a mask.      Neck:   No meningismus. Full ROM. Hematological/Lymphatic/Immunilogical:   No cervical lymphadenopathy. Cardiovascular:   RRR. Symmetric bilateral radial and DP pulses.  No murmurs. Cap refill less than 2 seconds. Respiratory:   Normal respiratory effort without tachypnea/retractions. Breath sounds are clear and equal bilaterally. No  wheezes/rales/rhonchi. Gastrointestinal:   Soft and nontender. Non distended. There is no CVA tenderness.  No rebound, rigidity, or guarding.  Musculoskeletal:   Normal range of motion in all extremities. Passive range of motion of the left shoulder elicits pain, joint is located without bony point tenderness.  There is also tenderness along the left lower chest wall around the anterior axillary line, ribs 5 through 9 without deformity crepitus or point tenderness.  This reproduces her pain.   No joint effusions.  No lower extremity tenderness.  No edema. Neurologic:   Normal speech and language.  Motor grossly intact. No acute focal neurologic deficits are appreciated.  Skin:    Skin is warm, dry and intact. No rash noted.  No petechiae, purpura, or bullae.  ____________________________________________    LABS (pertinent positives/negatives) (all labs ordered are listed, but only abnormal results are displayed) Labs Reviewed  BASIC METABOLIC PANEL - Abnormal; Notable for the following components:      Result Value   Glucose, Bld 112 (*)    Calcium 8.7 (*)    All other components within normal limits  CBC - Abnormal; Notable for the following components:   WBC 10.6 (*)    All other components within normal limits  TROPONIN I (HIGH SENSITIVITY)  TROPONIN I (HIGH SENSITIVITY)   ____________________________________________   EKG  Interpreted by me Normal sinus rhythm, rate of 88.  Right axis, normal intervals.  Right bundle branch block.  No acute ischemic changes.  ____________________________________________    [id number]  DG Chest 2 View  Result Date: 02/01/2020 CLINICAL DATA:  Chest pain EXAM: CHEST - 2 VIEW COMPARISON:  07/10/2017 chest radiograph and prior. FINDINGS: Cardiomediastinal silhouette within normal limits. Hazy bibasilar opacities, likely atelectasis. No pneumothorax or pleural effusion. No acute osseous abnormality. Sequela of right shoulder arthroplasty.  IMPRESSION: Bibasilar opacities, likely atelectasis. No focal airspace disease. Electronically Signed   By: Primitivo Gauze M.D.   On: 02/01/2020 08:07    ____________________________________________   PROCEDURES .1-3 Lead EKG Interpretation Performed by: Carrie Mew, MD Authorized by: Carrie Mew, MD     Interpretation: normal     ECG rate:  83   ECG rate assessment: normal     Rhythm: sinus rhythm     Ectopy:  none     Conduction: normal      ____________________________________________  DIFFERENTIAL DIAGNOSIS   Chest wall strain, pneumothorax, non-STEMI, nondisplaced rib fracture  CLINICAL IMPRESSION / ASSESSMENT AND PLAN / ED COURSE  Medications ordered in the ED: Medications  acetaminophen (TYLENOL) tablet 650 mg (650 mg Oral Given 02/01/20 1040)    Pertinent labs & imaging results that were available during my care of the patient were reviewed by me and considered in my medical decision making (see chart for details).  Teresa Lin was evaluated in Emergency Department on 02/01/2020 for the symptoms described in the history of present illness. She was evaluated in the context of the global COVID-19 pandemic, which necessitated consideration that the patient might be at risk for infection with the SARS-CoV-2 virus that causes COVID-19. Institutional protocols and algorithms that pertain to the evaluation of patients at risk for COVID-19 are in a state of rapid change based on information released by regulatory bodies including the CDC and federal and state organizations. These policies and algorithms were followed during the patient's care in the ED.   Patient presents with atypical chest pain.  Considering the patient's symptoms, medical history, and physical examination today, I have low suspicion for ACS, PE, TAD, pneumothorax, carditis, mediastinitis, pneumonia, CHF, or sepsis.   Clinical Course as of Jan 31 1126  Tue Feb 01, 2020  1017 Chest x-ray  reviewed by me, no pneumothorax or pneumonia.. Initial labs are unremarkable with normal troponin, normal white count, normal chemistry panel.   [PS]    Clinical Course User Index [PS] Carrie Mew, MD     ----------------------------------------- 11:31 AM on 02/01/2020 -----------------------------------------  Repeat troponin unremarkable.  Recommend Tylenol and heat therapy.  She has an appointment with her cardiologist in 2 days for routine follow-up which I encouraged her to keep.  ____________________________________________   FINAL CLINICAL IMPRESSION(S) / ED DIAGNOSES    Final diagnoses:  Chest wall pain     ED Discharge Orders    None      Portions of this note were generated with dragon dictation software. Dictation errors may occur despite best attempts at proofreading.   Carrie Mew, MD 02/01/20 1132    Carrie Mew, MD 02/01/20 1135

## 2020-02-01 NOTE — ED Triage Notes (Signed)
Pt comes into the ED via EMS from home with c/o left arm pain  With nausea and chest heaviness. Pt is in NAD on arrival . Pt Is a/ox4.. lives alone independent

## 2020-02-01 NOTE — Discharge Instructions (Signed)
Your lab tests and chest xray today were all okay. Please follow up with your doctors this week to continue monitoring your symptoms.  Take tylenol 650mg  every 4-6 hours and use a heating pad off and on and help alleviate the pain.

## 2020-02-01 NOTE — ED Notes (Signed)
Dr. Stafford at bedside.  

## 2020-02-01 NOTE — ED Notes (Signed)
Pt c/o centralized CP that radiates to left arm that started last night with nausea. Denies SHOB.  Pt in NAD. RR even and unlabored.  Alert and oriented. Clear speech

## 2020-02-03 ENCOUNTER — Emergency Department
Admission: EM | Admit: 2020-02-03 | Discharge: 2020-02-03 | Disposition: A | Discharge status: Home / Self Care | Payer: Medicare Other | Attending: Emergency Medicine | Admitting: Emergency Medicine

## 2020-02-03 ENCOUNTER — Other Ambulatory Visit (HOSPITAL_COMMUNITY): Payer: Self-pay | Admitting: Internal Medicine

## 2020-02-03 ENCOUNTER — Other Ambulatory Visit: Payer: Self-pay | Admitting: Internal Medicine

## 2020-02-03 ENCOUNTER — Emergency Department
Admission: RE | Admit: 2020-02-03 | Discharge: 2020-02-03 | Disposition: A | Discharge status: Home / Self Care | Payer: Medicare Other | Source: Ambulatory Visit | Attending: Internal Medicine | Admitting: Internal Medicine

## 2020-02-03 ENCOUNTER — Other Ambulatory Visit: Payer: Self-pay

## 2020-02-03 DIAGNOSIS — I2699 Other pulmonary embolism without acute cor pulmonale: Secondary | ICD-10-CM | POA: Insufficient documentation

## 2020-02-03 DIAGNOSIS — R091 Pleurisy: Secondary | ICD-10-CM

## 2020-02-03 DIAGNOSIS — R7989 Other specified abnormal findings of blood chemistry: Secondary | ICD-10-CM

## 2020-02-03 DIAGNOSIS — Z96611 Presence of right artificial shoulder joint: Secondary | ICD-10-CM | POA: Insufficient documentation

## 2020-02-03 DIAGNOSIS — Z96653 Presence of artificial knee joint, bilateral: Secondary | ICD-10-CM | POA: Insufficient documentation

## 2020-02-03 DIAGNOSIS — Z79899 Other long term (current) drug therapy: Secondary | ICD-10-CM | POA: Diagnosis not present

## 2020-02-03 MED ORDER — IOHEXOL 350 MG/ML SOLN
75.0000 mL | Freq: Once | INTRAVENOUS | Status: AC | PRN
  Administered 2020-02-03: 75 mL via INTRAVENOUS

## 2020-02-03 MED ORDER — APIXABAN (ELIQUIS) VTE STARTER PACK (10MG AND 5MG): ORAL_TABLET | ORAL | 0 refills | Status: DC

## 2020-02-03 MED ORDER — APIXABAN 5 MG PO TABS
10.0000 mg | ORAL_TABLET | Freq: Once | ORAL | Status: AC
  Administered 2020-02-03: 10 mg via ORAL
  Filled 2020-02-03: qty 2

## 2020-02-03 NOTE — ED Triage Notes (Signed)
Pt sent here from CT for positive PE.

## 2020-02-03 NOTE — ED Provider Notes (Signed)
[facility] Emergency Department Provider Note  ____________________________________________   I have reviewed the triage vital signs and the nursing notes.   HISTORY  Chief Complaint Chest Pain   History limited by: Not Limited   HPI Teresa Lin is a 86 y.o. female who presents to the emergency department today because of PE found on outpatient CT scan performed today. The patient had CT done because of elevated d-dimer that was found in work up for left shoulder and chest pain. The patient states that the pain has been present for the past two days. She feels that the pain is worse with breathing. Was seen in the ER the day the pain started with troponin negative x 2 and CXR negative. The patient then went to PCP office yesterday where d-dimer was obtained and was found to be elevated. The patient denies any history of blood clots. Is not on any blood thinners.   Records reviewed. Per medical record review patient has a history of ED visit 2 days ago. Colonoscopy 10 days ago.   Past Medical History:  Diagnosis Date  . Anemia   . Anxiety   . Arthritis    osteoarthritis. Bilateral knee replacement, hands, meniscal tear, cervical disc disease  . Atherosclerosis   . Atypical chest pain   . Collagen vascular disease ([place])   . Colon polyp 12/15/2017  . Colon polyps   . Compression fracture of L2 ([place])   . Diverticulosis   . DVT (deep venous thrombosis) ([place])   . GERD (gastroesophageal reflux disease)   . History of seronegative inflammatory arthritis   . Hypertension   . IBS (irritable bowel syndrome)   . Internal hemorrhoids   . Phlebitis and thrombophlebitis of deep veins of lower extremities (HCC)    after surgery  . Vision abnormalities     Patient Active Problem List   Diagnosis Date Noted  . Status post reverse total shoulder replacement, right 09/24/2018  . S/P right colectomy 01/26/2018  . Adenomatous polyp of ascending colon 11/24/2017  .  Generalized anxiety disorder 09/06/2017  . Irritable bowel syndrome (IBS) 09/06/2017  . Fracture of one rib, right side, subsequent encounter for fracture with routine healing 03/08/2017  . Orthostasis 06/05/2016  . Left shoulder pain 10/13/2015  . Constipation 05/30/2015  . Osteoporosis, postmenopausal 05/30/2015  . Compression fracture of L2 lumbar vertebra with delayed healing 05/29/2015  . Frequent falls 03/26/2015  . Atherosclerosis of arteries 03/26/2015  . Chest pain 02/09/2015  . Breast cancer screening 10/01/2014  . Family history of colon cancer 09/28/2014  . Viral URI with cough 06/12/2014  . Tenesmus (rectal) 06/12/2014  . TMJ (temporomandibular joint syndrome) 06/12/2014  . Varicose veins of both lower extremities without ulcer or inflammation 06/12/2014  . B12 deficiency 09/20/2013  . Fatigue 05/23/2013  . Internal hemorrhoids without complication 19/62/2297  . Insomnia 03/03/2013  . Vertigo due to cerebrovascular disease 03/03/2013  . Seronegative rheumatoid arthritis affecting lower leg ([place]) 03/03/2013    Past Surgical History:  Procedure Laterality Date  . APPENDECTOMY    . COLONOSCOPY N/A 01/24/2020   Procedure: COLONOSCOPY;  Surgeon: Lesly Rubenstein, MD;  Location: [facility] ENDOSCOPY;  Service: Endoscopy;  Laterality: N/A;  . COLONOSCOPY WITH PROPOFOL N/A 10/24/2017   Procedure: COLONOSCOPY WITH PROPOFOL;  Surgeon: Manya Silvas, MD;  Location: [facility] ENDOSCOPY;  Service: Endoscopy;  Laterality: N/A;  . COLONOSCOPY WITH PROPOFOL N/A 11/21/2017   Procedure: COLONOSCOPY WITH PROPOFOL;  Surgeon: Manya Silvas, MD;  Location: [facility] ENDOSCOPY;  Service: Endoscopy;  Laterality: N/A;  . DILATION AND CURETTAGE OF UTERUS    . ESOPHAGOGASTRODUODENOSCOPY (EGD) WITH PROPOFOL N/A 02/25/2018   Procedure: ESOPHAGOGASTRODUODENOSCOPY (EGD) WITH PROPOFOL;  Surgeon: Manya Silvas, MD;  Location: [facility] ENDOSCOPY;  Service: Endoscopy;  Laterality: N/A;  . EYE SURGERY      bilateral catarACT  . JOINT REPLACEMENT Bilateral    knee replacement  . LAPAROSCOPIC RIGHT COLECTOMY Right 12/15/2017   Procedure: LAPAROSCOPIC RIGHT COLECTOMY;  Surgeon: Robert Bellow, MD;  Location: [facility] ORS;  Service: General;  Laterality: Right;  . REPLACEMENT TOTAL KNEE BILATERAL    . TOTAL SHOULDER ARTHROPLASTY Right 09/24/2018   Procedure: TOTAL SHOULDER ARTHROPLASTY - REVERSE RIGHT;  Surgeon: Corky Mull, MD;  Location: [facility] ORS;  Service: Orthopedics;  Laterality: Right;    Prior to Admission medications   Medication Sig Start Date End Date Taking? Authorizing Provider  Abatacept (ORENCIA IV) Inject into the vein every 30 (thirty) days.    [provider]  alendronate (FOSAMAX) 70 MG tablet Take 70 mg by mouth every Monday.  06/14/15   [provider]  aspirin EC 325 MG tablet Take 1 tablet (325 mg total) by mouth daily. Patient not taking: Reported on 01/24/2020 09/24/18   Lattie Corns, PA-C  calcium-vitamin D (OSCAL WITH D) 500-200 MG-UNIT per tablet Take 1 tablet by mouth daily.     [provider]  folic acid (FOLVITE) 1 MG tablet Take 2 mg by mouth daily.     [provider]  hydroxychloroquine (PLAQUENIL) 200 MG tablet Take 200 mg by mouth daily.    [provider]  methotrexate 50 MG/2ML injection Inject 20 mg into the skin every Sunday.  03/16/15   [provider]  Multiple Vitamins-Minerals (MULTIVITAMIN WITH MINERALS) tablet Take 1 tablet by mouth daily. Centrum Silver    [provider]  Polyethyl Glycol-Propyl Glycol (SYSTANE) 0.4-0.3 % SOLN Place 1 drop into both eyes 3 (three) times daily as needed (for eye burning/irritation).    [provider]    Allergies Infliximab and Synvisc [hylan g-f 20]  Family History  Problem Relation Age of Onset  . Hypertension Mother   . Cancer Mother 110       colon ca  . Heart attack Mother   . Hypertension Father   . Heart attack Father   .  Hypertension Sister   . Heart attack Sister   . Multiple sclerosis Daughter   . Multiple sclerosis Son   . Multiple sclerosis Son     Social History Social History   Tobacco Use  . Smoking status: Never Smoker  . Smokeless tobacco: Never Used  Vaping Use  . Vaping Use: Never used  Substance Use Topics  . Alcohol use: Yes    Alcohol/week: 2.0 standard drinks    Types: 2 Cans of beer per week    Comment: occasionally   . Drug use: No    Review of Systems Constitutional: No fever/chills Eyes: No visual changes. ENT: No sore throat. Cardiovascular: Positive for chest pain. Respiratory: Positive for shortness of breath. Gastrointestinal: No abdominal pain.  No nausea, no vomiting.  No diarrhea.   Genitourinary: Negative for dysuria. Musculoskeletal: Negative for back pain. Skin: Negative for rash. Neurological: Negative for headaches, focal weakness or numbness.  ____________________________________________   PHYSICAL EXAM:  VITAL SIGNS: ED Triage Vitals [02/03/20 1911]  Enc Vitals Group     BP (!) 193/89     Pulse  Rate 95     Resp 20     Temp 98.2 F (36.8 C)     Temp Source Oral     SpO2 100 %     Weight 135 lb (61.2 kg)     Height 5\' 6"  (1.676 m)     Head Circumference      Peak Flow      Pain Score 0   Constitutional: Alert and oriented.  Eyes: Conjunctivae are normal.  ENT      Head: Normocephalic and atraumatic.      Nose: No congestion/rhinnorhea.      Mouth/Throat: Mucous membranes are moist.      Neck: No stridor. Hematological/Lymphatic/Immunilogical: No cervical lymphadenopathy. Cardiovascular: Normal rate, regular rhythm.  No murmurs, rubs, or gallops. Respiratory: Normal respiratory effort without tachypnea nor retractions. Breath sounds are clear and equal bilaterally. No wheezes/rales/rhonchi. Gastrointestinal: Soft and non tender. No rebound. No guarding.  Genitourinary: Deferred Musculoskeletal: Normal range of motion in all extremities.  No lower extremity edema. Neurologic:  Normal speech and language. No gross focal neurologic deficits are appreciated.  Skin:  Skin is warm, dry and intact. No rash noted. Psychiatric: Mood and affect are normal. Speech and behavior are normal. Patient exhibits appropriate insight and judgment.  ____________________________________________    LABS (pertinent positives/negatives)  None  ____________________________________________   EKG  None  ____________________________________________    RADIOLOGY  CT angio performed as outpatient reviewed: Subsegmental PE  ____________________________________________   PROCEDURES  Procedures  ____________________________________________   INITIAL IMPRESSION / ASSESSMENT AND PLAN / ED COURSE  Pertinent labs & imaging results that were available during my care of the patient were reviewed by me and considered in my medical decision making (see chart for details).   Patient presented to the emergency department today because of concern for PE found on outpatient CT.  Patient has been complaining of some chest pain and shortness of breath.  CT shows subsegmental PE with small amount of pulmonary infarction.  Discussed with Dr. Lucky Cowboy.  Will plan on starting oral anticoagulants.  Will give patient follow-up information.  Discussed return cautions.   ____________________________________________   FINAL CLINICAL IMPRESSION(S) / ED DIAGNOSES  Final diagnoses:  Other acute pulmonary embolism without acute cor pulmonale ([place])     Note: This dictation was prepared with Dragon dictation. Any transcriptional errors that result from this process are unintentional     Nance Pear, MD 02/03/20 2031

## 2020-02-03 NOTE — ED Notes (Signed)
Pt states that she's been having heaviness in her chest, shoulders, and down the rib cage that started three days ago but has since gone away. Pt denies current symptoms.

## 2020-02-03 NOTE — ED Notes (Signed)
Pt from CT for positive PE scan

## 2020-02-03 NOTE — Discharge Instructions (Signed)
Please seek medical attention for any high fevers, chest pain, shortness of breath, change in behavior, persistent vomiting, bloody stool or any other new or concerning symptoms.  

## 2020-02-04 ENCOUNTER — Other Ambulatory Visit: Payer: Self-pay | Admitting: Cardiology

## 2020-02-04 ENCOUNTER — Ambulatory Visit
Admission: RE | Admit: 2020-02-04 | Discharge: 2020-02-04 | Disposition: A | Discharge status: Home / Self Care | Payer: Medicare Other | Source: Ambulatory Visit | Attending: Cardiology | Admitting: Cardiology

## 2020-02-04 ENCOUNTER — Other Ambulatory Visit: Payer: Self-pay

## 2020-02-04 DIAGNOSIS — Z86711 Personal history of pulmonary embolism: Secondary | ICD-10-CM | POA: Diagnosis not present

## 2020-03-10 ENCOUNTER — Inpatient Hospital Stay: Payer: Medicare Other

## 2020-03-10 ENCOUNTER — Other Ambulatory Visit: Payer: Self-pay

## 2020-03-10 ENCOUNTER — Inpatient Hospital Stay: Payer: Medicare Other | Attending: Oncology | Admitting: Oncology

## 2020-03-10 VITALS — BP 135/66 | HR 80 | Temp 97.8°F | Resp 16 | Wt 135.9 lb

## 2020-03-10 DIAGNOSIS — I2693 Single subsegmental pulmonary embolism without acute cor pulmonale: Secondary | ICD-10-CM | POA: Diagnosis not present

## 2020-03-10 DIAGNOSIS — R296 Repeated falls: Secondary | ICD-10-CM

## 2020-03-10 DIAGNOSIS — Z7901 Long term (current) use of anticoagulants: Secondary | ICD-10-CM | POA: Diagnosis not present

## 2020-03-10 DIAGNOSIS — I2699 Other pulmonary embolism without acute cor pulmonale: Secondary | ICD-10-CM

## 2020-03-13 NOTE — Progress Notes (Signed)
Hematology/Oncology Consult note [facility] Telephone:([phone] [phone]  Patient Care Team: Adin Hector, MD as PCP - General (Internal Medicine) Bary Castilla Forest Gleason, MD (General Surgery)   Name of the patient: Teresa Lin  [id number]  [birth year]    Reason for referral-acute pulmonary embolism Referring physician-Dr. Ramonita Lab  Date of visit: 03/13/20   History of presenting illness-patient is a 86 year old female with a past medical history significant for osteoporosis, anxiety disorder and has had frequent falls in the past.  She presented to the ER with Pleuritic chest pain and underwent a chest x-ray which was essentially normal and was discharged.  She later saw Dr. Caryl Comes who ordered a CT angio chest to rule out PE.  CT showed a isolated subsegmental pulmonary embolus in the left lower lobe.  Patient is currently on Eliquis for the same and has been referred to [place] for further recommendations.  She has not had any prior history of DVT or PE.  No significant family history of thromboembolism.  No preceding trauma or immobility or long distance trips.  She is not taking any hormone replacement therapy.  She is doing relatively well for her age and remains independent of her ADLs.  Reports that her pleuritic chest pain is getting better  ECOG PS- 1  Pain scale- 0   Review of systems- Review of Systems  Constitutional: Negative for chills, fever, malaise/fatigue and weight loss.  HENT: Negative for congestion, ear discharge and nosebleeds.   Eyes: Negative for blurred vision.  Respiratory: Negative for cough, hemoptysis, sputum production, shortness of breath and wheezing.   Cardiovascular: Negative for chest pain, palpitations, orthopnea and claudication.  Gastrointestinal: Negative for abdominal pain, blood in stool, constipation, diarrhea, heartburn, melena, nausea and vomiting.  Genitourinary: Negative for dysuria, flank pain, frequency,  hematuria and urgency.  Musculoskeletal: Negative for back pain, joint pain and myalgias.  Skin: Negative for rash.  Neurological: Negative for dizziness, tingling, focal weakness, seizures, weakness and headaches.  Endo/Heme/Allergies: Does not bruise/bleed easily.  Psychiatric/Behavioral: Negative for depression and suicidal ideas. The patient does not have insomnia.     Allergies  Allergen Reactions  . Infliximab Other (See Comments)    Shaking, pain  . Synvisc [[place]ylan G-F 20] Swelling    Patient Active Problem List   Diagnosis Date Noted  . Status post reverse total shoulder replacement, right 09/24/2018  . S/P right colectomy 01/26/2018  . Adenomatous polyp of ascending colon 11/24/2017  . Generalized anxiety disorder 09/06/2017  . Irritable bowel syndrome (IBS) 09/06/2017  . Fracture of one rib, right side, subsequent encounter for fracture with routine healing 03/08/2017  . Orthostasis 06/05/2016  . Left shoulder pain 10/13/2015  . Constipation 05/30/2015  . Osteoporosis, postmenopausal 05/30/2015  . Compression fracture of L2 lumbar vertebra with delayed healing 05/29/2015  . Frequent falls 03/26/2015  . Atherosclerosis of arteries 03/26/2015  . Chest pain 02/09/2015  . Breast cancer screening 10/01/2014  . Family history of colon cancer 09/28/2014  . Viral URI with cough 06/12/2014  . Tenesmus (rectal) 06/12/2014  . TMJ (temporomandibular joint syndrome) 06/12/2014  . Varicose veins of both lower extremities without ulcer or inflammation 06/12/2014  . B12 deficiency 09/20/2013  . Fatigue 05/23/2013  . Internal hemorrhoids without complication 67/20/9470  . Insomnia 03/03/2013  . Vertigo due to cerebrovascular disease 03/03/2013  . Seronegative rheumatoid arthritis affecting lower leg ([place]) 03/03/2013     Past Medical History:  Diagnosis Date  . Anemia   .  Anxiety   . Arthritis    osteoarthritis. Bilateral knee replacement, hands, meniscal tear, cervical disc  disease  . Atherosclerosis   . Atypical chest pain   . Collagen vascular disease ([place])   . Colon polyp 12/15/2017  . Colon polyps   . Compression fracture of L2 ([place])   . Diverticulosis   . DVT (deep venous thrombosis) ([place])   . GERD (gastroesophageal reflux disease)   . History of seronegative inflammatory arthritis   . Hypertension   . IBS (irritable bowel syndrome)   . Internal hemorrhoids   . Phlebitis and thrombophlebitis of deep veins of lower extremities (HCC)    after surgery  . Vision abnormalities      Past Surgical History:  Procedure Laterality Date  . APPENDECTOMY    . COLONOSCOPY N/A 01/24/2020   Procedure: COLONOSCOPY;  Surgeon: Lesly Rubenstein, MD;  Location: [facility] ENDOSCOPY;  Service: Endoscopy;  Laterality: N/A;  . COLONOSCOPY WITH PROPOFOL N/A 10/24/2017   Procedure: COLONOSCOPY WITH PROPOFOL;  Surgeon: Manya Silvas, MD;  Location: [facility] ENDOSCOPY;  Service: Endoscopy;  Laterality: N/A;  . COLONOSCOPY WITH PROPOFOL N/A 11/21/2017   Procedure: COLONOSCOPY WITH PROPOFOL;  Surgeon: Manya Silvas, MD;  Location: [facility] ENDOSCOPY;  Service: Endoscopy;  Laterality: N/A;  . DILATION AND CURETTAGE OF UTERUS    . ESOPHAGOGASTRODUODENOSCOPY (EGD) WITH PROPOFOL N/A 02/25/2018   Procedure: ESOPHAGOGASTRODUODENOSCOPY (EGD) WITH PROPOFOL;  Surgeon: Manya Silvas, MD;  Location: [facility] ENDOSCOPY;  Service: Endoscopy;  Laterality: N/A;  . EYE SURGERY     bilateral catarACT  . JOINT REPLACEMENT Bilateral    knee replacement  . LAPAROSCOPIC RIGHT COLECTOMY Right 12/15/2017   Procedure: LAPAROSCOPIC RIGHT COLECTOMY;  Surgeon: Robert Bellow, MD;  Location: [facility] ORS;  Service: General;  Laterality: Right;  . REPLACEMENT TOTAL KNEE BILATERAL    . TOTAL SHOULDER ARTHROPLASTY Right 09/24/2018   Procedure: TOTAL SHOULDER ARTHROPLASTY - REVERSE RIGHT;  Surgeon: Corky Mull, MD;  Location: [facility] ORS;  Service: Orthopedics;  Laterality: Right;    Social History    Socioeconomic History  . Marital status: Widowed    Spouse name: Not on file  . Number of children: Not on file  . Years of education: Not on file  . Highest education level: Not on file  Occupational History  . Not on file  Tobacco Use  . Smoking status: Never Smoker  . Smokeless tobacco: Never Used  Vaping Use  . Vaping Use: Never used  Substance and Sexual Activity  . Alcohol use: Yes    Alcohol/week: 2.0 standard drinks    Types: 2 Cans of beer per week    Comment: occasionally   . Drug use: No  . Sexual activity: Never  Other Topics Concern  . Not on file  Social History Narrative  . Not on file   Social Determinants of Health   Financial Resource Strain:   . Difficulty of Paying Living Expenses: Not on file  Food Insecurity:   . Worried About Charity fundraiser in the Last Year: Not on file  . Ran Out of Food in the Last Year: Not on file  Transportation Needs:   . Lack of Transportation (Medical): Not on file  . Lack of Transportation (Non-Medical): Not on file  Physical Activity:   . Days of Exercise per Week: Not on file  . Minutes of Exercise per Session: Not on file  Stress:   . Feeling of Stress : Not on file  Social Connections:   . Frequency of Communication with Friends and Family: Not on file  . Frequency of Social Gatherings with Friends and Family: Not on file  . Attends Religious Services: Not on file  . Active Member of Clubs or Organizations: Not on file  . Attends Archivist Meetings: Not on file  . Marital Status: Not on file  Intimate Partner Violence:   . Fear of Current or Ex-Partner: Not on file  . Emotionally Abused: Not on file  . Physically Abused: Not on file  . Sexually Abused: Not on file     Family History  Problem Relation Age of Onset  . Hypertension Mother   . Cancer Mother 21       colon ca  . Heart attack Mother   . Hypertension Father   . Heart attack Father   . Hypertension Sister   . Heart attack  Sister   . Multiple sclerosis Daughter   . Multiple sclerosis Son   . Multiple sclerosis Son      Current Outpatient Medications:  .  Abatacept (ORENCIA IV), Inject into the vein every 30 (thirty) days., Disp: , Rfl:  .  APIXABAN (ELIQUIS) VTE STARTER PACK (10MG  AND 5MG ), Take as directed on package: start with two-5mg  tablets twice daily for 7 days. On day 8, switch to one-5mg  tablet twice daily., Disp: 1 each, Rfl: 0 .  aspirin EC 325 MG tablet, Take 1 tablet (325 mg total) by mouth daily., Disp: 30 tablet, Rfl: 0 .  calcium-vitamin D (OSCAL WITH D) 500-200 MG-UNIT per tablet, Take 1 tablet by mouth daily. , Disp: , Rfl:  .  folic acid (FOLVITE) 1 MG tablet, Take 2 mg by mouth daily. , Disp: , Rfl:  .  hydroxychloroquine (PLAQUENIL) 200 MG tablet, Take 200 mg by mouth daily., Disp: , Rfl:  .  methotrexate 50 MG/2ML injection, Inject 20 mg into the skin every Sunday. , Disp: , Rfl:  .  Multiple Vitamins-Minerals (MULTIVITAMIN WITH MINERALS) tablet, Take 1 tablet by mouth daily. Centrum Silver, Disp: , Rfl:  .  Polyethyl Glycol-Propyl Glycol (SYSTANE) 0.4-0.3 % SOLN, Place 1 drop into both eyes 3 (three) times daily as needed (for eye burning/irritation)., Disp: , Rfl:  .  alendronate (FOSAMAX) 70 MG tablet, Take 70 mg by mouth every Monday.  (Patient not taking: Reported on 03/10/2020), Disp: , Rfl:    Physical exam:  Vitals:   03/10/20 1132  BP: 135/66  Pulse: 80  Resp: 16  Temp: 97.8 F (36.6 C)  TempSrc: Tympanic  SpO2: 95%  Weight: 135 lb 14.4 oz (61.6 kg)   Physical Exam HENT:     Head: Normocephalic and atraumatic.  Eyes:     Pupils: Pupils are equal, round, and reactive to light.  Cardiovascular:     Rate and Rhythm: Normal rate and regular rhythm.     Heart sounds: Normal heart sounds.  Pulmonary:     Effort: Pulmonary effort is normal.     Breath sounds: Normal breath sounds.  Abdominal:     General: Bowel sounds are normal.     Palpations: Abdomen is soft.    Musculoskeletal:     Cervical back: Normal range of motion.  Skin:    General: Skin is warm and dry.  Neurological:     Mental Status: She is alert and oriented to person, place, and time.        CMP Latest Ref Rng & Units 02/01/2020  Glucose 70 -  99 mg/dL 112(H)  BUN 8 - 23 mg/dL 13  Creatinine 0.44 - 1.00 mg/dL 0.45  Sodium 135 - 145 mmol/L 136  Potassium 3.5 - 5.1 mmol/L 3.7  Chloride 98 - 111 mmol/L 100  CO2 22 - 32 mmol/L 27  Calcium 8.9 - 10.3 mg/dL 8.7(L)  Total Protein 6.1 - 8.1 g/dL -  Total Bilirubin 0.2 - 1.2 mg/dL -  Alkaline Phos 25 - 125 U/L -  AST 10 - 35 U/L -  ALT 6 - 29 U/L -   CBC Latest Ref Rng & Units 02/01/2020  WBC 4.0 - 10.5 K/uL 10.6(H)  Hemoglobin 12.0 - 15.0 g/dL 14.0  Hematocrit 36 - 46 % 42.1  Platelets 150 - 400 K/uL 226    Assessment and plan- Patient is a 86 y.o. female with new diagnosis of acute left lower lobe subsegmental pulmonary embolus on Eliquis referred for further management  Patient seems to have had a small pulmonary embolus that was seemingly unprovoked.  For a small subsegmental pulmonary embolus guidelines are conservative management without anticoagulation versus time-limited trial of anticoagulation.  Given the patient was symptomatic from her PE I would recommend that she should stay on Eliquis for 6 months.  Given her age and frequent falls, I would recommend that she should stop her Eliquis after 6 months.  Given her age as well as lack of prior history of thromboembolism I do not see the need to do a hypercoagulable work-up at this time.  If she has a recurrent pulmonary embolus after stopping her anticoagulation she will need to stay on it lifelong but that would have to be weighed Periodically with the risks of bleeding and falls versus staying on anticoagulation.  I will see her back in 6 months with CBC with differential and CMP   Thank you for this kind referral and the opportunity to participate in the care of this  patient   Visit Diagnosis 1. Acute pulmonary embolism without acute cor pulmonale, unspecified pulmonary embolism type (HCC)     Dr. Randa Evens, MD, MPH [facility] [phone] 03/13/2020

## 2020-03-23 ENCOUNTER — Other Ambulatory Visit: Payer: Self-pay | Admitting: Internal Medicine

## 2020-03-23 DIAGNOSIS — Z1231 Encounter for screening mammogram for malignant neoplasm of breast: Secondary | ICD-10-CM

## 2020-04-23 ENCOUNTER — Emergency Department: Payer: Medicare Other

## 2020-04-23 ENCOUNTER — Encounter: Payer: Self-pay | Admitting: Emergency Medicine

## 2020-04-23 ENCOUNTER — Emergency Department
Admission: EM | Admit: 2020-04-23 | Discharge: 2020-04-23 | Disposition: A | Discharge status: Home / Self Care | Payer: Medicare Other | Attending: Emergency Medicine | Admitting: Emergency Medicine

## 2020-04-23 ENCOUNTER — Other Ambulatory Visit: Payer: Self-pay

## 2020-04-23 DIAGNOSIS — S0083XA Contusion of other part of head, initial encounter: Secondary | ICD-10-CM | POA: Diagnosis not present

## 2020-04-23 DIAGNOSIS — S12041A Nondisplaced lateral mass fracture of first cervical vertebra, initial encounter for closed fracture: Secondary | ICD-10-CM | POA: Diagnosis not present

## 2020-04-23 DIAGNOSIS — M542 Cervicalgia: Secondary | ICD-10-CM | POA: Diagnosis not present

## 2020-04-23 DIAGNOSIS — W01198A Fall on same level from slipping, tripping and stumbling with subsequent striking against other object, initial encounter: Secondary | ICD-10-CM | POA: Insufficient documentation

## 2020-04-23 DIAGNOSIS — Z96652 Presence of left artificial knee joint: Secondary | ICD-10-CM | POA: Diagnosis not present

## 2020-04-23 DIAGNOSIS — Z7982 Long term (current) use of aspirin: Secondary | ICD-10-CM | POA: Diagnosis not present

## 2020-04-23 DIAGNOSIS — W19XXXA Unspecified fall, initial encounter: Secondary | ICD-10-CM

## 2020-04-23 DIAGNOSIS — I10 Essential (primary) hypertension: Secondary | ICD-10-CM | POA: Diagnosis not present

## 2020-04-23 DIAGNOSIS — Z7901 Long term (current) use of anticoagulants: Secondary | ICD-10-CM | POA: Insufficient documentation

## 2020-04-23 DIAGNOSIS — S12040A Displaced lateral mass fracture of first cervical vertebra, initial encounter for closed fracture: Secondary | ICD-10-CM

## 2020-04-23 DIAGNOSIS — Z96611 Presence of right artificial shoulder joint: Secondary | ICD-10-CM | POA: Insufficient documentation

## 2020-04-23 DIAGNOSIS — S0990XA Unspecified injury of head, initial encounter: Secondary | ICD-10-CM | POA: Diagnosis present

## 2020-04-23 MED ORDER — ACETAMINOPHEN 500 MG PO TABS
1000.0000 mg | ORAL_TABLET | Freq: Once | ORAL | Status: AC
  Administered 2020-04-23: 1000 mg via ORAL
  Filled 2020-04-23: qty 2

## 2020-04-23 NOTE — ED Triage Notes (Signed)
Pt reports yesterday afternoon she tripped and fell to the floor and now has pain to her neck and a bruise to her left forehead. Denies LOC, c/o pain to her neck

## 2020-04-23 NOTE — ED Triage Notes (Signed)
Pt reports she takes eloquis daily but did not take it last today

## 2020-04-23 NOTE — ED Notes (Signed)
Pt to CT at this time.

## 2020-04-23 NOTE — Consult Note (Signed)
Neurosurgery-New Consultation Evaluation 04/23/2020 Teresa Lin [id number]  Identifying Statement: Teresa Lin is a 86 y.o. female from [place] [id number] with neck pain  Physician Requesting Consultation: [place] [facility]  History of Present Illness: Teresa Lin is here for evaluation after having a mechanical fall yesterday where she says she did trip.  She did hit her head but she is mostly complaining of neck pain over the past 24 hours.  She states she was moving around in the past day without any concern for numbness or weakness.  She does not endorse pain in extremities.  She was placed in a cervical collar in the emergency department and a CT scan of the cervical spine was obtained which did reveal a C1 fracture.  Past Medical History:  Past Medical History:  Diagnosis Date  . Anemia   . Anxiety   . Arthritis    osteoarthritis. Bilateral knee replacement, hands, meniscal tear, cervical disc disease  . Atherosclerosis   . Atypical chest pain   . Collagen vascular disease ([place])   . Colon polyp 12/15/2017  . Colon polyps   . Compression fracture of L2 ([place])   . Diverticulosis   . DVT (deep venous thrombosis) ([place])   . GERD (gastroesophageal reflux disease)   . History of seronegative inflammatory arthritis   . Hypertension   . IBS (irritable bowel syndrome)   . Internal hemorrhoids   . Phlebitis and thrombophlebitis of deep veins of lower extremities (HCC)    after surgery  . Vision abnormalities     Social History: Social History   Socioeconomic History  . Marital status: Widowed    Spouse name: Not on file  . Number of children: Not on file  . Years of education: Not on file  . Highest education level: Not on file  Occupational History  . Not on file  Tobacco Use  . Smoking status: Never Smoker  . Smokeless tobacco: Never Used  Vaping Use  . Vaping Use: Never used  Substance and Sexual Activity  . Alcohol use: Yes    Alcohol/week:  2.0 standard drinks    Types: 2 Cans of beer per week    Comment: occasionally   . Drug use: No  . Sexual activity: Never  Other Topics Concern  . Not on file  Social History Narrative  . Not on file   Social Determinants of Health   Financial Resource Strain: Not on file  Food Insecurity: Not on file  Transportation Needs: Not on file  Physical Activity: Not on file  Stress: Not on file  Social Connections: Not on file  Intimate Partner Violence: Not on file    Family History: Family History  Problem Relation Age of Onset  . Hypertension Mother   . Cancer Mother 55       colon ca  . Heart attack Mother   . Hypertension Father   . Heart attack Father   . Hypertension Sister   . Heart attack Sister   . Multiple sclerosis Daughter   . Multiple sclerosis Son   . Multiple sclerosis Son     Review of Systems:  Review of Systems - General ROS: Negative Psychological ROS: Negative Ophthalmic ROS: Negative ENT ROS: Negative Hematological and Lymphatic ROS: Negative  Endocrine ROS: Negative Respiratory ROS: Negative Cardiovascular ROS: Negative Gastrointestinal ROS: Negative Genito-Urinary ROS: Negative Musculoskeletal ROS: Positive for neck pain Neurological ROS: Negative for numbness or weakness Dermatological ROS: Negative  Physical Exam: BP (!) 162/73  Pulse 74   Temp 98.4 F (36.9 C) (Oral)   Resp (!) 22   Ht 5\' 6"  (1.676 m)   Wt 61.6 kg   SpO2 95%   BMI 21.92 kg/m  Body mass index is 21.92 kg/m. Body surface area is 1.69 meters squared. General appearance: Alert, cooperative, in no acute distress Head: Normocephalic, small bruising noted over the left forehead area Eyes: Normal, EOM intact Neck: In [place] collar Ext: No edema in LE bilaterally, warm extremities  Neurologic exam:  Mental status: alertness: alert,  affect: normal Speech: fluent and clear Motor:strength symmetric 5/5 in bilateral upper and lower extremities Sensory: intact  to light touch in all extremities Gait: Not tested but patient has been ambulatory  Laboratory: Results for orders placed or performed during the hospital encounter of 73/71/06  Basic metabolic panel  Result Value Ref Range   Sodium 136 135 - 145 mmol/L   Potassium 3.7 3.5 - 5.1 mmol/L   Chloride 100 98 - 111 mmol/L   CO2 27 22 - 32 mmol/L   Glucose, Bld 112 (H) 70 - 99 mg/dL   BUN 13 8 - 23 mg/dL   Creatinine, Ser 0.45 0.44 - 1.00 mg/dL   Calcium 8.7 (L) 8.9 - 10.3 mg/dL   GFR calc non Af Amer >60 >60 mL/min   GFR calc Af Amer >60 >60 mL/min   Anion gap 9 5 - 15  CBC  Result Value Ref Range   WBC 10.6 (H) 4.0 - 10.5 K/uL   RBC 4.31 3.87 - 5.11 MIL/uL   Hemoglobin 14.0 12.0 - 15.0 g/dL   HCT 42.1 36.0 - 46.0 %   MCV 97.7 80.0 - 100.0 fL   MCH 32.5 26.0 - 34.0 pg   MCHC 33.3 30.0 - 36.0 g/dL   RDW 13.8 11.5 - 15.5 %   Platelets 226 150 - 400 K/uL   nRBC 0.0 0.0 - 0.2 %  Troponin I (High Sensitivity)  Result Value Ref Range   Troponin I (High Sensitivity) 7 <18 ng/L  Troponin I (High Sensitivity)  Result Value Ref Range   Troponin I (High Sensitivity) 6 <18 ng/L   I personally reviewed labs  Imaging: CT cervical spine: FINDINGS: Alignment: Minimal grade 1 C4-5 anterolisthesis, likely degenerative. Partial fusion of the right C4-5 and left C5-6 posterior spinal elements.  Skull base and vertebrae: Fractures involving the anterior and posterior right C1 ring with minimal widening anteriorly (5:30) and minimal displacement posteriorly (2:21).  Minimal posterior positioning of the left lateral C1 mass upon the C2 body (4:36), likely positional. Intact odontoid. Partial fusion of the right C2-3 posterior spinal elements.  Soft tissues and spinal canal: No significant prevertebral fluid or swelling. No visible canal hematoma.  Disc levels: Multilevel spondylosis most prominent at the C6-7 level. Patent bony spinal canal.  Upper chest: Minimal  atelectasis.  Other: None.  IMPRESSION: Minimally displaced anterior and posterior right C1 ring fractures.  Multilevel spondylosis.  Impression/Plan:  Teresa Lin is here with what appears to be a right sided C1 ring fracture with minimal displacement.  She does not have any neurologic symptoms but is complaining of neck pain.  She is in a cervical collar and we recommend continue the [facility] J collar for now with upright x-rays.  If these are stable, she can be seen in clinic in 2 weeks with follow-up x-rays.  We did discuss the natural history of these types of fractures and she can heal with time but he will  require prolonged immobilization in the cervical collar.  If this unfortunate is not healing the future, she may need to undergo a spinal fusion which she understands.   1.  Diagnosis: C1 fracture  2.  Plan Keep a [place] J collar Upright x-rays were reviewed and are stable Patient cleared for follow-up in clinic in 2 weeks

## 2020-04-23 NOTE — ED Notes (Signed)
Pt placed in [place] J collar at this time

## 2020-04-23 NOTE — ED Provider Notes (Signed)
[facility] Emergency Department Provider Note ____________________________________________   Event Date/Time   First MD Initiated Contact with Patient 04/23/20 [phone]     (approximate)  I have reviewed the triage vital signs and the nursing notes.  HISTORY  Chief Complaint Fall, Neck Pain (/), and Headache   HPI Teresa Lin is a 86 y.o. femalewho presents to the ED for evaluation of neck pain after a fall.   Chart review indicates history of RA, DVT/PE on Eliquis.  Patient reports a mechanical fall that occurred yesterday afternoon when she tripped on a rug and fell to the floor, striking her left forehead on the ground.  She denies any LOC, emesis or difficulty ambulating after this.  She is reporting constant pain to the back of her neck at the base of her skull, up to 6/10 intensity.  Due to this persistent pain, and pain with her ADLs, she presents to the ED for evaluation.  She reports ambulating at her baseline today without recurrent falls or signs of pain beyond her neck.  No recent illnesses. She reports taking her Eliquis yesterday morning and this morning, but missing the dose yesterday evening.  Past Medical History:  Diagnosis Date  . Anemia   . Anxiety   . Arthritis    osteoarthritis. Bilateral knee replacement, hands, meniscal tear, cervical disc disease  . Atherosclerosis   . Atypical chest pain   . Collagen vascular disease ([place])   . Colon polyp 12/15/2017  . Colon polyps   . Compression fracture of L2 ([place])   . Diverticulosis   . DVT (deep venous thrombosis) ([place])   . GERD (gastroesophageal reflux disease)   . History of seronegative inflammatory arthritis   . Hypertension   . IBS (irritable bowel syndrome)   . Internal hemorrhoids   . Phlebitis and thrombophlebitis of deep veins of lower extremities (HCC)    after surgery  . Vision abnormalities     Patient Active Problem List   Diagnosis Date Noted  . Status post reverse  total shoulder replacement, right 09/24/2018  . S/P right colectomy 01/26/2018  . Adenomatous polyp of ascending colon 11/24/2017  . Generalized anxiety disorder 09/06/2017  . Irritable bowel syndrome (IBS) 09/06/2017  . Fracture of one rib, right side, subsequent encounter for fracture with routine healing 03/08/2017  . Orthostasis 06/05/2016  . Left shoulder pain 10/13/2015  . Constipation 05/30/2015  . Osteoporosis, postmenopausal 05/30/2015  . Compression fracture of L2 lumbar vertebra with delayed healing 05/29/2015  . Frequent falls 03/26/2015  . Atherosclerosis of arteries 03/26/2015  . Chest pain 02/09/2015  . Breast cancer screening 10/01/2014  . Family history of colon cancer 09/28/2014  . Viral URI with cough 06/12/2014  . Tenesmus (rectal) 06/12/2014  . TMJ (temporomandibular joint syndrome) 06/12/2014  . Varicose veins of both lower extremities without ulcer or inflammation 06/12/2014  . B12 deficiency 09/20/2013  . Fatigue 05/23/2013  . Internal hemorrhoids without complication 96/08/5407  . Insomnia 03/03/2013  . Vertigo due to cerebrovascular disease 03/03/2013  . Seronegative rheumatoid arthritis affecting lower leg ([place]) 03/03/2013    Past Surgical History:  Procedure Laterality Date  . APPENDECTOMY    . COLONOSCOPY N/A 01/24/2020   Procedure: COLONOSCOPY;  Surgeon: Lesly Rubenstein, MD;  Location: [facility] ENDOSCOPY;  Service: Endoscopy;  Laterality: N/A;  . COLONOSCOPY WITH PROPOFOL N/A 10/24/2017   Procedure: COLONOSCOPY WITH PROPOFOL;  Surgeon: Manya Silvas, MD;  Location: [facility] ENDOSCOPY;  Service: Endoscopy;  Laterality: N/A;  .  COLONOSCOPY WITH PROPOFOL N/A 11/21/2017   Procedure: COLONOSCOPY WITH PROPOFOL;  Surgeon: Manya Silvas, MD;  Location: [facility] ENDOSCOPY;  Service: Endoscopy;  Laterality: N/A;  . DILATION AND CURETTAGE OF UTERUS    . ESOPHAGOGASTRODUODENOSCOPY (EGD) WITH PROPOFOL N/A 02/25/2018   Procedure: ESOPHAGOGASTRODUODENOSCOPY (EGD)  WITH PROPOFOL;  Surgeon: Manya Silvas, MD;  Location: [facility] ENDOSCOPY;  Service: Endoscopy;  Laterality: N/A;  . EYE SURGERY     bilateral catarACT  . JOINT REPLACEMENT Bilateral    knee replacement  . LAPAROSCOPIC RIGHT COLECTOMY Right 12/15/2017   Procedure: LAPAROSCOPIC RIGHT COLECTOMY;  Surgeon: Robert Bellow, MD;  Location: [facility] ORS;  Service: General;  Laterality: Right;  . REPLACEMENT TOTAL KNEE BILATERAL    . TOTAL SHOULDER ARTHROPLASTY Right 09/24/2018   Procedure: TOTAL SHOULDER ARTHROPLASTY - REVERSE RIGHT;  Surgeon: Corky Mull, MD;  Location: [facility] ORS;  Service: Orthopedics;  Laterality: Right;    Prior to Admission medications   Medication Sig Start Date End Date Taking? Authorizing Provider  Abatacept (ORENCIA IV) Inject into the vein every 30 (thirty) days.    [provider]  APIXABAN Arne Cleveland) VTE STARTER PACK (10MG  AND 5MG ) Take as directed on package: start with two-5mg  tablets twice daily for 7 days. On day 8, switch to one-5mg  tablet twice daily. 02/03/20   Nance Pear, MD  aspirin EC 325 MG tablet Take 1 tablet (325 mg total) by mouth daily. 09/24/18   Lattie Corns, PA-C  calcium-vitamin D (OSCAL WITH D) 500-200 MG-UNIT per tablet Take 1 tablet by mouth daily.     [provider]  folic acid (FOLVITE) 1 MG tablet Take 2 mg by mouth daily.     [provider]  hydroxychloroquine (PLAQUENIL) 200 MG tablet Take 200 mg by mouth daily.    [provider]  methotrexate 50 MG/2ML injection Inject 20 mg into the skin every Sunday.  03/16/15   [provider]  Multiple Vitamins-Minerals (MULTIVITAMIN WITH MINERALS) tablet Take 1 tablet by mouth daily. Centrum Silver    [provider]  Polyethyl Glycol-Propyl Glycol (SYSTANE) 0.4-0.3 % SOLN Place 1 drop into both eyes 3 (three) times daily as needed (for eye burning/irritation).    [provider]    Allergies Infliximab and Synvisc [hylan g-f  20]  Family History  Problem Relation Age of Onset  . Hypertension Mother   . Cancer Mother 43       colon ca  . Heart attack Mother   . Hypertension Father   . Heart attack Father   . Hypertension Sister   . Heart attack Sister   . Multiple sclerosis Daughter   . Multiple sclerosis Son   . Multiple sclerosis Son     Social History Social History   Tobacco Use  . Smoking status: Never Smoker  . Smokeless tobacco: Never Used  Vaping Use  . Vaping Use: Never used  Substance Use Topics  . Alcohol use: Yes    Alcohol/week: 2.0 standard drinks    Types: 2 Cans of beer per week    Comment: occasionally   . Drug use: No    Review of Systems  Constitutional: No fever/chills Eyes: No visual changes. ENT: No sore throat. Cardiovascular: Denies chest pain. Respiratory: Denies shortness of breath. Gastrointestinal: No abdominal pain.  No nausea, no vomiting.  No diarrhea.  No constipation. Genitourinary: Negative for dysuria. Musculoskeletal: Negative for back pain.  Positive for neck pain Skin: Negative for rash. Neurological: Negative for  focal weakness or numbness.  ____________________________________________   PHYSICAL EXAM:  VITAL SIGNS: Vitals:   04/23/20 1130 04/23/20 1200  BP: (!) 162/73 (!) 153/83  Pulse: 74 72  Resp: (!) 22 20  Temp:    SpO2: 95% 95%      Constitutional: Alert and oriented. Well appearing and in no acute distress. Eyes: Conjunctivae are normal. PERRL. EOMI. Head: 2cm circular bruising to left forehead at her hairline, small underlying hematoma. No discrete laceration.  No bony step-offs or signs of depressed skull fracture.  No evidence of EOM entrapment. Nose: No congestion/rhinnorhea. Mouth/Throat: Mucous membranes are moist.  Oropharynx non-erythematous. Neck: No stridor. No cervical spine tenderness to palpation. Cardiovascular: Normal rate, regular rhythm. Grossly normal heart sounds.  Good peripheral circulation. Respiratory:  Normal respiratory effort.  No retractions. Lungs CTAB. Gastrointestinal: Soft , nondistended, nontender to palpation. No CVA tenderness. Musculoskeletal: No lower extremity tenderness nor edema.  No joint effusions. No signs of acute trauma. Palpation of all 4 extremities without evidence of additional injury or traumatic deformity. Neurologic:  Normal speech and language. No gross focal neurologic deficits are appreciated. No gait instability noted. Cranial nerves II through XII intact 5/5 strength and sensation in all 4 extremities Skin:  Skin is warm, dry and intact. No rash noted. Psychiatric: Mood and affect are normal. Speech and behavior are normal.  ____________________________________________   LABS (all labs ordered are listed, but only abnormal results are displayed)  Labs Reviewed - No data to display  ____________________________________________  RADIOLOGY  ED MD interpretation:   CT head reviewed by me without evidence of acute ICH. CT C-spine reviewed by me with evidence of C1 fracture  Official radiology report(s): DG Cervical Spine 2-3 Views  Result Date: 04/23/2020 CLINICAL DATA:  C1 fracture. EXAM: CERVICAL SPINE - 2-3 VIEW COMPARISON:  CT of same day. FINDINGS: Minimal grade 1 anterolisthesis of C4-5 is noted secondary to posterior facet joint hypertrophy. There is otherwise normal alignment of the vertebral bodies, including C1-2. Severe degenerative disc disease is noted at C5-6 and C6-7 with anterior osteophyte formation. Moderate degenerative changes seen at C4-5. Hypertrophy is seen involving posterior facet joints bilaterally consistent with degenerative change. IMPRESSION: Minimal grade 1 anterolisthesis of C4-5 is noted secondary to posterior facet joint hypertrophy. Otherwise normal alignment of cervical spine is noted. Severe multilevel degenerative disc disease is noted. Electronically Signed   By: Marijo Conception M.D.   On: 04/23/2020 10:54   CT Head Wo  Contrast  Result Date: 04/23/2020 CLINICAL DATA:  Head trauma, minor, normal mental status, fall injury on blood thinners EXAM: CT HEAD WITHOUT CONTRAST TECHNIQUE: Contiguous axial images were obtained from the base of the skull through the vertex without intravenous contrast. COMPARISON:  11/10/2015, 01/24/2016 and prior. FINDINGS: Brain: No acute infarct or intracranial hemorrhage. No mass lesion. No midline shift, ventriculomegaly or extra-axial fluid collection. Chronic bilateral basal ganglia lacunar insults. Diffuse cerebral atrophy with ex vacuo dilatation. Chronic microvascular ischemic changes. Vascular: No hyperdense vessel or unexpected calcification. Skull: No calvarial fracture. Sinuses/Orbits: No acute orbital finding. Pneumatized paranasal sinuses and mastoid air cells. Other: Small left frontal scalp hematoma IMPRESSION: No acute intracranial process.  Small left frontal scalp hematoma. Mild cerebral atrophy and moderate chronic microvascular ischemic changes. Electronically Signed   By: Primitivo Gauze M.D.   On: 04/23/2020 08:45   CT Cervical Spine Wo Contrast  Result Date: 04/23/2020 CLINICAL DATA:  Neck trauma (Age >= 65y) EXAM: CT CERVICAL SPINE WITHOUT CONTRAST TECHNIQUE: Multidetector CT  imaging of the cervical spine was performed without intravenous contrast. Multiplanar CT image reconstructions were also generated. COMPARISON:  12/28/2007. FINDINGS: Alignment: Minimal grade 1 C4-5 anterolisthesis, likely degenerative. Partial fusion of the right C4-5 and left C5-6 posterior spinal elements. Skull base and vertebrae: Fractures involving the anterior and posterior right C1 ring with minimal widening anteriorly (5:30) and minimal displacement posteriorly (2:21). Minimal posterior positioning of the left lateral C1 mass upon the C2 body (4:36), likely positional. Intact odontoid. Partial fusion of the right C2-3 posterior spinal elements. Soft tissues and spinal canal: No significant  prevertebral fluid or swelling. No visible canal hematoma. Disc levels: Multilevel spondylosis most prominent at the C6-7 level. Patent bony spinal canal. Upper chest: Minimal atelectasis. Other: None. IMPRESSION: Minimally displaced anterior and posterior right C1 ring fractures. Multilevel spondylosis. These results were called by telephone at the time of interpretation on 04/23/2020 at 8:57 am to provider Dr. Tamala Julian, Who verbally acknowledged these results. Electronically Signed   By: Primitivo Gauze M.D.   On: 04/23/2020 08:58    ____________________________________________   PROCEDURES and INTERVENTIONS  Procedure(s) performed (including Critical Care):  .1-3 Lead EKG Interpretation Performed by: Vladimir Crofts, MD Authorized by: Vladimir Crofts, MD     Interpretation: normal     ECG rate:  70   ECG rate assessment: normal     Rhythm: sinus rhythm     Ectopy: none     Conduction: normal      Medications  acetaminophen (TYLENOL) tablet 1,000 mg (1,000 mg Oral Given 04/23/20 1056)    ____________________________________________   MDM / ED COURSE   86 year old woman on Eliquis presents to the ED after mechanical fall causing a C1 fracture, amenable to outpatient management.  Normal vitals.  Exam without any evidence of neurovascular deficits.  She has a bruise to her forehead, but no further evidence of acute trauma.  CT imaging of her head shows no evidence of acute ICH or CVA.  CT C-spine though does show a C1 fracture.  Discussed the case with neurosurgery, who evaluate the patient, and recommends nonoperative management.  Patient placed in a long-term c-collar, and upright x-rays reveal continued stability of her cervical spine.  Neurosurgery reviewed these films and indicates that they will see the patient as an outpatient in 2 weeks time.  We thoroughly discussed outpatient management with the patient, including spine precautions and keeping her cervical collar on at all times.   We discussed following up with neurosurgery .  And we discussed return precautions for the ED.  Patient is stable for discharge home.   Clinical Course as of 04/23/20 1223  Sun Apr 23, 2020  1941 Call from rads,  [DS]  0926 I spoke with Dr. Lacinda Axon, NSGY on call, who agrees to see the patient in consultation. Recommends collar, which has been applied.  [DS]  7408 Dr. Lacinda Axon to the bedside to eval the patient. He recommends a [place] J collar, upright films, and then call him to review films. He anticipates outpatient management.  [DS]  1448 [id number] from Dr. Lacinda Axon, who has reviewed Xrays of C spine. Good for dc from his persepctive and outpatient f/u in 2 weeks.  [DS]  7026 Return to the bedside and educated patient and son on outpatient management. [DS]    Clinical Course User Index [DS] Vladimir Crofts, MD    ____________________________________________   FINAL CLINICAL IMPRESSION(S) / ED DIAGNOSES  Final diagnoses:  Neck pain  Fall, initial encounter  Closed displaced lateral mass  fracture of first cervical vertebra, initial encounter [facility])     ED Discharge Orders    None       [facility] [place]   Note:  This document was prepared using Dragon voice recognition software and may include unintentional dictation errors.   Vladimir Crofts, MD 04/23/20 1225

## 2020-04-23 NOTE — ED Notes (Signed)
Pt moved into ED 6. Pt placed on monitor and external cath placed. Family at bedside.

## 2020-04-23 NOTE — ED Notes (Signed)
Pt and pt son verbalized understanding of d/c instructions at this time. Pt and son denied further questions at this time.   Pt assisted in wheelchair to POV at this time, tolerated well, NAD and steady gait noted.

## 2020-04-23 NOTE — Discharge Instructions (Addendum)
Like we talked about, you will need to keep the collar on at all times and drastically change her activity level over the next few weeks.  In general, no heavy lifting or performing any tasks that require 2 hands.   No driving.  You need to keep the collar on at all times. To bathe, I would recommend sponge baths and not showers.   Continue to take all of your other medications, including your RA meds and injections, and Eliquis blood thinner.  Call your PCP tomorrow morning to let them know about this and to schedule a follow-up appointment this week.   Call the attached phone number for the neurosurgeons to follow-up in the clinic in about 2 weeks time. It is very important to schedule this visit and ensure you are able to make it that day.   If you develop any fevers, weakness to either of your hands or arms, other strokelike symptoms, or uncontrolled pain, please return to the ED immediately.  Use Tylenol for pain and fevers.  Up to 1000 mg per dose, up to 4 times per day.  Do not take more than 4000 mg of Tylenol/acetaminophen within 24 hours.Marland Kitchen

## 2020-05-17 ENCOUNTER — Other Ambulatory Visit: Payer: Self-pay | Admitting: Nurse Practitioner

## 2020-05-17 DIAGNOSIS — Z8781 Personal history of (healed) traumatic fracture: Secondary | ICD-10-CM

## 2020-05-29 ENCOUNTER — Other Ambulatory Visit: Payer: Self-pay

## 2020-05-29 ENCOUNTER — Ambulatory Visit
Admission: RE | Admit: 2020-05-29 | Discharge: 2020-05-29 | Disposition: A | Discharge status: Home / Self Care | Payer: Medicare Other | Source: Ambulatory Visit | Attending: Nurse Practitioner | Admitting: Nurse Practitioner

## 2020-05-29 DIAGNOSIS — Z8781 Personal history of (healed) traumatic fracture: Secondary | ICD-10-CM | POA: Insufficient documentation

## 2020-06-09 ENCOUNTER — Other Ambulatory Visit: Payer: Self-pay | Admitting: Neurosurgery

## 2020-06-09 ENCOUNTER — Other Ambulatory Visit (HOSPITAL_COMMUNITY): Payer: Self-pay | Admitting: Neurosurgery

## 2020-06-09 DIAGNOSIS — S1202XG Unstable burst fracture of first cervical vertebra, subsequent encounter for fracture with delayed healing: Secondary | ICD-10-CM

## 2020-07-04 ENCOUNTER — Other Ambulatory Visit: Payer: Self-pay

## 2020-07-04 ENCOUNTER — Ambulatory Visit
Admission: RE | Admit: 2020-07-04 | Discharge: 2020-07-04 | Disposition: A | Discharge status: Home / Self Care | Payer: Medicare Other | Source: Ambulatory Visit | Attending: Neurosurgery | Admitting: Neurosurgery

## 2020-07-04 DIAGNOSIS — S1202XG Unstable burst fracture of first cervical vertebra, subsequent encounter for fracture with delayed healing: Secondary | ICD-10-CM

## 2020-07-05 ENCOUNTER — Ambulatory Visit
Admission: RE | Admit: 2020-07-05 | Discharge: 2020-07-05 | Disposition: A | Discharge status: Home / Self Care | Payer: Medicare Other | Source: Ambulatory Visit | Attending: Internal Medicine | Admitting: Internal Medicine

## 2020-07-05 DIAGNOSIS — Z1231 Encounter for screening mammogram for malignant neoplasm of breast: Secondary | ICD-10-CM | POA: Diagnosis present

## 2020-08-15 ENCOUNTER — Ambulatory Visit: Payer: Medicare Other | Admitting: Speech Pathology

## 2020-08-17 ENCOUNTER — Ambulatory Visit: Payer: Medicare Other | Attending: Internal Medicine

## 2020-08-17 ENCOUNTER — Ambulatory Visit: Payer: Medicare Other | Admitting: Speech Pathology

## 2020-08-17 ENCOUNTER — Other Ambulatory Visit: Payer: Self-pay

## 2020-08-17 DIAGNOSIS — R252 Cramp and spasm: Secondary | ICD-10-CM | POA: Diagnosis present

## 2020-08-17 DIAGNOSIS — R250 Abnormal head movements: Secondary | ICD-10-CM | POA: Diagnosis present

## 2020-08-17 DIAGNOSIS — M6281 Muscle weakness (generalized): Secondary | ICD-10-CM | POA: Insufficient documentation

## 2020-08-17 DIAGNOSIS — R296 Repeated falls: Secondary | ICD-10-CM | POA: Diagnosis present

## 2020-08-18 NOTE — Therapy (Addendum)
[place] MAIN [facility] SERVICES [address] [place], [place], [address] Phone: [phone]   Fax:  [phone]  Physical Therapy Evaluation  Patient Details  Name: Teresa Lin MRN: [id number] Date of Birth: 04-05-1932 Referring Provider (PT): Crescent Shah,MD (Neurology)   Encounter Date: 08/17/2020   PT End of Session - 08/18/20 0912    Visit Number 1    Number of Visits 17    Date for PT Re-Evaluation 11/09/20    Authorization Type Medicare Parts A and B    Authorization Time Period 08/17/20-11/09/20    PT Start Time 0802    PT Stop Time 1017    PT Time Calculation (min) 50 min    Activity Tolerance Patient tolerated treatment well;No increased pain;Patient limited by fatigue    Behavior During Therapy Teresa Lin for tasks assessed/performed           Past Medical History:  Diagnosis Date  . Anemia   . Anxiety   . Arthritis    osteoarthritis. Bilateral knee replacement, hands, meniscal tear, cervical disc disease  . Atherosclerosis   . Atypical chest pain   . Collagen vascular disease ([place])   . Colon polyp 12/15/2017  . Colon polyps   . Compression fracture of L2 ([place])   . Diverticulosis   . DVT (deep venous thrombosis) ([place])   . GERD (gastroesophageal reflux disease)   . History of seronegative inflammatory arthritis   . Hypertension   . IBS (irritable bowel syndrome)   . Internal hemorrhoids   . Phlebitis and thrombophlebitis of deep veins of lower extremities (HCC)    after surgery  . Vision abnormalities     Past Surgical History:  Procedure Laterality Date  . APPENDECTOMY    . COLONOSCOPY N/A 01/24/2020   Procedure: COLONOSCOPY;  Surgeon: Lesly Rubenstein, MD;  Location: [facility] ENDOSCOPY;  Service: Endoscopy;  Laterality: N/A;  . COLONOSCOPY WITH PROPOFOL N/A 10/24/2017   Procedure: COLONOSCOPY WITH PROPOFOL;  Surgeon: Manya Silvas, MD;  Location: [facility] ENDOSCOPY;  Service: Endoscopy;  Laterality: N/A;  . COLONOSCOPY WITH  PROPOFOL N/A 11/21/2017   Procedure: COLONOSCOPY WITH PROPOFOL;  Surgeon: Manya Silvas, MD;  Location: [facility] ENDOSCOPY;  Service: Endoscopy;  Laterality: N/A;  . DILATION AND CURETTAGE OF UTERUS    . ESOPHAGOGASTRODUODENOSCOPY (EGD) WITH PROPOFOL N/A 02/25/2018   Procedure: ESOPHAGOGASTRODUODENOSCOPY (EGD) WITH PROPOFOL;  Surgeon: Manya Silvas, MD;  Location: [facility] ENDOSCOPY;  Service: Endoscopy;  Laterality: N/A;  . EYE SURGERY     bilateral catarACT  . JOINT REPLACEMENT Bilateral    knee replacement  . LAPAROSCOPIC RIGHT COLECTOMY Right 12/15/2017   Procedure: LAPAROSCOPIC RIGHT COLECTOMY;  Surgeon: Robert Bellow, MD;  Location: [facility] ORS;  Service: General;  Laterality: Right;  . REPLACEMENT TOTAL KNEE BILATERAL    . TOTAL SHOULDER ARTHROPLASTY Right 09/24/2018   Procedure: TOTAL SHOULDER ARTHROPLASTY - REVERSE RIGHT;  Surgeon: Corky Mull, MD;  Location: [facility] ORS;  Service: Orthopedics;  Laterality: Right;    There were no vitals filed for this visit.    Subjective Assessment - 08/17/20 0812    Subjective Pt coming to OPPT to after lengthy cervical immobilization and continued nonunion C1 fracture, wants help with regaining her independence, driving, neck mobility, and help with intermittent neck pain.    Pertinent History Brief History: Teresa Lin is an 32yoF who comes to [facility] OPPT at main for evaluation of neck stiffness. Pt is referred by her neurologist Dr. Manuella Ghazi, although  he did not treat the patient for this issue. The patient is s/p C1 burst fracture 04/22/20 (non-surgical) with only partial evidence only within the last 4 weeks of union- followed by neurosurgical (Dr. Izora Ribas), however there is no mention of PT in neurosurgical notes, nor specific restrictions, precautions mentioned in the neurosurgical team's notes. Pt reports she wants to improve her neck pain and return to driving. Pt has been asking multiple providers about return to driving (ceased at onset of  fracture) with varying responses from multiple providers- neurosurgical has opted to avoid offering surgical options at this time give the patients documented mild cognitive impairment, also raises concerns of MCI in the context of return to driving and recommends the patient discuss return to driving with her PCP or neurologist- he makes no mention of any specific ROM limitations of the C spine that would preclude operating a motor vehicle. Dr. Manuella Ghazi who has followed the patient for years and documented detail regarding mild cognitive impairment (MCI) and recommended multiple rehab services for this problem, mentions the desire of the patient to return to driving, however does not frame this within the context of the MCI, rather has made a referral to a driving rehabilitation specialist Lawanna Kobus) in the area. The entire scope of Eddie Dibbles Riley's practice is unknown to Chief Strategy Officer.   HPI: Pt sustained a fall 04/23/20 with subsequently identified C1 fractures, displaced right anterior ring and displaced right posterior ring. Pt declined surgical fixation at that time, what placed in a rigid cervical collar until March 10, at which point she was given a soft cervical collar to wear 'as needed.' No additional activity restrictions, or neck precautions can be found in Dr. Rhea Bleacher note- the patient denies any specific details received verbally. Pt had a FU cervical CT on 05/29/20 reporting "no definite healing" and "increased interval widening of the anterior fracture." Pt had a FU cervical CT on 07/04/20 reporting "Since the most recent exam, there has been some healing of the patient's' fracture through the posterior arch of C1. The lateral fracture fragment is slightly displaced, unchanged. No bridging one is seen about the anterior fracture fragments which are distracted approximately 0.9 cm." Dr. 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 Rhea Bleacher last office visit note. Of note- pt elected to seek a 2nd opinion at [facility] c Dr. Mertie Moores, his note reporting "She has been noted to have some degenerative changes from C4-C7. In reviewing her CT scans, I cannot say with complete certainty did that her transverse ligament is disrupted. There was no anterolisthesis on the CT scans C1 on C2. There was approximately 4 mm of lateral listhesis of the C1 facet on the right." Pt had cervical xrays taken at that visit, the report as follows: "07/25/2020 3:11 PM EDT  X-rays of cervical spine, AP and lateral as well as the odontoid and flexion-extension lateral views show slight lateral listhesis of the C1 facet on C2. This is a total of approximately 6 mm. There is multilevel disc degeneration. The lateral film as well as a flexion-extension lateral still not show any anterolisthesis of C1 on C2."    PMH: Multiple documented falls since 2016 c resultant rib fractures (2016), L2 compression fracture (2018), Rt humeral fracture (2020), C1 burst fracture (2021), fall c Rt humeral fracture now c Rt rTSA (2020), fall c head injury  and C1 burst fracture managed conservatively (2021), PE on Eliquis followed by cardiology (2021), bilat TKA (>15ya), RA, remote L2 compression fracture, osteopenia, HLD, MCI c SLUMS 21/30 followed by neurology, vestibular migraine 3-4 episodes/year followed by neurology, vericose veins c pedal edema on compression stockings.     Limitations Reading    How long can you sit comfortably? Neck does not limit.    How long can you stand comfortably? Neck does not limit.    How long can you walk comfortably? Can sometimes be sore, but not limited.    Currently in Pain? No/denies          Standing Cervical Rotation A/ROM Lt: 24 degrees Rt: 22 degrees    SLS:  Left: best of 5 attempts, ~2 seconds, able to perform stepping righting without  use of hands, arrested by cramping in hamstrings Right: best of 3 attempts ~4 seconds, able to perform stepping righting without use of hands  Objective measurements completed on examination: See above findings.      PT Education - 08/18/20 0912    Education Details Unclear what her prognosis is regarding her C1 healing and how it wil affect her funcitonally.    Person(s) Educated Patient    Methods Explanation    Comprehension Verbalized understanding            PT Short Term Goals - 08/18/20 6644      PT SHORT TERM GOAL #1   Title After 4 weeks pt will reports less fatigue in her neck, report only requiring the cervical soft collar <4 days per week.    Baseline Feels need for collar most afternoons due to fatigue    Time 4    Period Weeks    Status New    Target Date 09/14/20      PT SHORT TERM GOAL #2   Title After 4 weeks pt to demonstrate improved 5xSTS <12 seconds and without LOB.    Baseline 13.67sec c LOB on 2nd rep of 6    Time 4    Period Weeks    Status New    Target Date 09/14/20      PT SHORT TERM GOAL #3   Title After 4 weeks pt to demonstrate improve lumbopelvic motor control AEB ability to ATTEMPT SLS multiple times without onset cramping in legs.    Baseline has severe cramping limitations in hamstrings during testing on eval    Time 4    Period Weeks    Status New    Target Date 09/14/20             PT Long Term Goals - 08/18/20 0931      PT LONG TERM GOAL #1   Title After 6 weeks pt to score >80 on FOTO survey to demonstrate improved participation in life.    Baseline 77 at eval    Time 6    Period Weeks    Status New    Target Date 09/28/20      PT LONG TERM GOAL #2   Title After 8 weeks pt to demonstrate 5xSTS in <12sec eyes closed without LOB to demonstrate improved dynamic motor control.    Baseline eyes open at eval    Time 8    Period Weeks    Status New    Target Date 10/12/20      PT LONG TERM GOAL #3   Title After 8 weeks  pt to demonstrate improved balance AEB SLS >5sec bilat without  use of arms for righting upon failure thereafter.    Baseline <3 sec SLS, no use of arms for righting, but arresting hamstrings cramps bilat    Time 8    Period Weeks    Status New    Target Date 10/13/20      PT LONG TERM GOAL #4   Title TBD long term balance goal    Baseline standardized balance tool deferred to future visit               Plan - 08/18/20 0913    Clinical Impression Statement Pt referred to OPPT for post-immobilization neck hypomobility, pain, and weakness. Referring provider is not the specialist managing this patient's cervical spine fracture, hence it remains unclear to what degree the patient's specific cervical spine deficits will be directly addressed. Physical therapy evaluation has identified 5 problems to be addressed with our skilled services.  1. Neck fatigue late in date, pain, use of collar prn Motor weakness, postural limitations, motor control deficits, all can be addressed.  2. Neck hypomobility s/p lengthy immobilization Will be addressed indirectly; direct treatment of the upper cervical region is severely restricted due to safety concerns for the following reasons: lack of evidence basis in prognosis and safety given continued nonunion burst fracture; impossible to predict arthrokinematics given advanced, multilevel spondylosis and disruption of atlantooccipital joint structure, atlantoaxial joint disruption; lack of specific guidelines, limitations, precautions from neurosurgical team regarding rehabilitation interval, and no baseline measurement of cervical ROM to compared or set goals; pt also lacks referral from MD that manages this problem directly, neurosurgery. 3. BUE weakness s/p neck immobilization  4. History of falls with injury  Documented falls going back as far as 2016, multifactorial including vestibular migraine, inconsistent DME use, pedal edema, and 'ankle weakness'. Will  evaluate in greater depth in future sessions.  5. Unable to return to driving  Will be addressed indirectly as this problem is multifactorial and currently being managed with interdisciplinary team approach. Will continue to assess and maximize all other symptoms for driving, unclear if ROM will ever be suitable for head turns in driving, however pt is also being assessed for how MCI may impact this potential.   Pt demonstrating impairment of strength, balance, postural control, and activity tolerance, limiting indep in IADL, home duties, and resuming baseline social/family activity. Pt will benefit from skilled PT intervention to address the above deficits in order to restore patient to PLOF and improve safety for return to home.   Patient's condition has the potential to improve in response to therapy. Maximum improvement is yet to be obtained. The anticipated improvement is attainable and reasonable in a generally predictable time.       Personal Factors and Comorbidities Age;Sex;Behavior Pattern;Comorbidity 1;Fitness;Time since onset of injury/illness/exacerbation    Comorbidities osteoporosis    Examination-Activity Limitations Bend;Bathing;Dressing;Lift;Reach Overhead;Stand;Stairs    Examination-Participation Restrictions Driving;Community Activity;Yard Work;Cleaning    Stability/Clinical Decision Making Unstable/Unpredictable    Clinical Decision Making High    Rehab Potential Fair    PT Frequency 2x / week    PT Duration 12 weeks    PT Treatment/Interventions ADLs/Self Care Home Management;Cryotherapy;Electrical Stimulation;Moist Heat;DME Instruction;Gait training;Stair training;Functional mobility training;Therapeutic activities;Therapeutic exercise;Balance training;Neuromuscular re-education;Patient/family education;Passive range of motion    PT Next Visit Plan Discuss plan of PT and limitations of care, particualrly regarding return to driivng. Screen right shoulder moblity/strength for  lifting activity at home, cervical extension/flexion A/ROM assessent (already permitted for [employer]); Exhaustive balance screening to address 5+ years  of falls and orthopedic insult.    PT Home Exercise Plan None issued at eval; to be determine.    Consulted and Agree with Plan of Care Patient           Patient will benefit from skilled therapeutic intervention in order to improve the following deficits and impairments:  Decreased balance,Abnormal gait,Decreased activity tolerance,Decreased knowledge of use of DME,Decreased knowledge of precautions,Decreased mobility,Decreased range of motion,Decreased strength,Hypomobility,Increased muscle spasms,Postural dysfunction,Impaired UE functional use  Visit Diagnosis: Abnormal head movements  Repeated falls  Cramp and spasm     Problem List Patient Active Problem List   Diagnosis Date Noted  . Status post reverse total shoulder replacement, right 09/24/2018  . S/P right colectomy 01/26/2018  . Adenomatous polyp of ascending colon 11/24/2017  . Generalized anxiety disorder 09/06/2017  . Irritable bowel syndrome (IBS) 09/06/2017  . Fracture of one rib, right side, subsequent encounter for fracture with routine healing 03/08/2017  . Orthostasis 06/05/2016  . Left shoulder pain 10/13/2015  . Constipation 05/30/2015  . Osteoporosis, postmenopausal 05/30/2015  . Compression fracture of L2 lumbar vertebra with delayed healing 05/29/2015  . Frequent falls 03/26/2015  . Atherosclerosis of arteries 03/26/2015  . Chest pain 02/09/2015  . Breast cancer screening 10/01/2014  . Family history of colon cancer 09/28/2014  . Viral URI with cough 06/12/2014  . Tenesmus (rectal) 06/12/2014  . TMJ (temporomandibular joint syndrome) 06/12/2014  . Varicose veins of both lower extremities without ulcer or inflammation 06/12/2014  . B12 deficiency 09/20/2013  . Fatigue 05/23/2013  . Internal hemorrhoids without complication 64/68/0321  .  Insomnia 03/03/2013  . Vertigo due to cerebrovascular disease 03/03/2013  . Seronegative rheumatoid arthritis affecting lower leg ([place]) 03/03/2013   10:07 AM, 08/18/20 Etta Grandchild, PT, DPT Physical Therapist - [place] [phone]     Etta Grandchild 08/18/2020, 9:38 AM  [address] MAIN [facility] SERVICES [address] [place], [place], [address] Phone: [phone]   Fax:  [phone]  Name: Teresa Lin MRN: [id number] Date of Birth: [birth year]

## 2020-08-21 ENCOUNTER — Other Ambulatory Visit: Payer: Self-pay

## 2020-08-21 ENCOUNTER — Ambulatory Visit: Payer: Medicare Other

## 2020-08-21 DIAGNOSIS — R296 Repeated falls: Secondary | ICD-10-CM

## 2020-08-21 DIAGNOSIS — R250 Abnormal head movements: Secondary | ICD-10-CM | POA: Diagnosis not present

## 2020-08-21 NOTE — Therapy (Signed)
[place] MAIN [facility] SERVICES [address] [place], [place], [address] Phone: [phone]   Fax:  [phone]  Physical Therapy Treatment  Patient Details  Name: Teresa Lin MRN: [id number] Date of Birth: 06-21-1931 Referring Provider (PT): Inman Shah,MD (Neurology)   Encounter Date: 08/21/2020   PT End of Session - 08/21/20 1058    Visit Number 2    Number of Visits 17    Date for PT Re-Evaluation 11/09/20    Authorization Type Medicare Parts A and B    Authorization Time Period 08/17/20-11/09/20    PT Start Time 0719    PT Stop Time 1093    PT Time Calculation (min) 40 min    Activity Tolerance Patient tolerated treatment well;No increased pain;Patient limited by fatigue    Behavior During Therapy [facility] for tasks assessed/performed           Past Medical History:  Diagnosis Date  . Anemia   . Anxiety   . Arthritis    osteoarthritis. Bilateral knee replacement, hands, meniscal tear, cervical disc disease  . Atherosclerosis   . Atypical chest pain   . Collagen vascular disease ([place])   . Colon polyp 12/15/2017  . Colon polyps   . Compression fracture of L2 ([place])   . Diverticulosis   . DVT (deep venous thrombosis) ([place])   . GERD (gastroesophageal reflux disease)   . History of seronegative inflammatory arthritis   . Hypertension   . IBS (irritable bowel syndrome)   . Internal hemorrhoids   . Phlebitis and thrombophlebitis of deep veins of lower extremities (HCC)    after surgery  . Vision abnormalities     Past Surgical History:  Procedure Laterality Date  . APPENDECTOMY    . COLONOSCOPY N/A 01/24/2020   Procedure: COLONOSCOPY;  Surgeon: Lesly Rubenstein, MD;  Location: [facility] ENDOSCOPY;  Service: Endoscopy;  Laterality: N/A;  . COLONOSCOPY WITH PROPOFOL N/A 10/24/2017   Procedure: COLONOSCOPY WITH PROPOFOL;  Surgeon: Manya Silvas, MD;  Location: [facility] ENDOSCOPY;  Service: Endoscopy;  Laterality: N/A;  . COLONOSCOPY WITH  PROPOFOL N/A 11/21/2017   Procedure: COLONOSCOPY WITH PROPOFOL;  Surgeon: Manya Silvas, MD;  Location: [facility] ENDOSCOPY;  Service: Endoscopy;  Laterality: N/A;  . DILATION AND CURETTAGE OF UTERUS    . ESOPHAGOGASTRODUODENOSCOPY (EGD) WITH PROPOFOL N/A 02/25/2018   Procedure: ESOPHAGOGASTRODUODENOSCOPY (EGD) WITH PROPOFOL;  Surgeon: Manya Silvas, MD;  Location: [facility] ENDOSCOPY;  Service: Endoscopy;  Laterality: N/A;  . EYE SURGERY     bilateral catarACT  . JOINT REPLACEMENT Bilateral    knee replacement  . LAPAROSCOPIC RIGHT COLECTOMY Right 12/15/2017   Procedure: LAPAROSCOPIC RIGHT COLECTOMY;  Surgeon: Robert Bellow, MD;  Location: [facility] ORS;  Service: General;  Laterality: Right;  . REPLACEMENT TOTAL KNEE BILATERAL    . TOTAL SHOULDER ARTHROPLASTY Right 09/24/2018   Procedure: TOTAL SHOULDER ARTHROPLASTY - REVERSE RIGHT;  Surgeon: Corky Mull, MD;  Location: [facility] ORS;  Service: Orthopedics;  Laterality: Right;    There were no vitals filed for this visit.   Subjective Assessment - 08/21/20 1056    Subjective Patient reports she is very confused about what to do for her neck. Brought her soft collar but did not wear it. Has many questions.    Pertinent History HPI    Limitations Reading    How long can you sit comfortably? Neck does not limit.    How long can you stand comfortably? Neck does not limit.  How long can you walk comfortably? Can sometimes be sore, but not limited.    Currently in Pain? No/denies               Right Left  Shoulder Flexion 108 WFL  Shoulder Abduction 80 WFL  ER -25% WFL  IR -25% WFL    R shoulder hypomobile with AP, PA, and inferior mobilizations grade II, 3x 10 seconds each direction.    Supine <>sit with education on log roll and safe technique for reduced torsion on cervical spine.   Seated posture: educated and performed upright neutral alignment with scapular retraction and neutral cervical alignment 3x 30 seconds   Cervical  collar: soft collar: education on wearing when performing exercises, walking outside, and when she has pain. Educated on need to call physician if new onset of pain occurs.    Access Code: [email or web address] URL: [email or web address] Date: 08/21/2020 Prepared by: Janna Arch  Exercises Supine Shoulder Flexion Extension AAROM with Dowel - 1 x daily - 7 x weekly - 2 sets - 10 reps - 5 hold Supine Pectoralis Stretch - 1 x daily - 7 x weekly - 2 sets - 2 reps - 30 hold Seated Shoulder External Rotation - 1 x daily - 7 x weekly - 2 sets - 10 reps - 5 hold    Patient educated on brace wear, need to rest when fatigued, calling physician immediately if she has any pain. Patient aware that therapists require further direction from physicians prior to direct therapeutic intervention to neck and is agreeable to shoulder and balance interventions at this time. Awaiting further direction from Dr. Cari Caraway in regards to protocol for spine at this time. R shoulder assessed with noted limitations in R shoulder range of motion. Exercises prescribed for postural correction and ROM increase in safe positions for neck at this time. Patient demonstrated understanding at this time. Patient would benefit from skilled physical therapy to improve quality of life, will continue with current POC at this time.                    PT Education - 08/21/20 1057    Education Details need for clearance to address neck, R shoulder ROm and HEP    Person(s) Educated Patient    Methods Explanation;Demonstration;Tactile cues;Verbal cues    Comprehension Verbalized understanding;Returned demonstration;Verbal cues required;Tactile cues required            PT Short Term Goals - 08/18/20 4098      PT SHORT TERM GOAL #1   Title After 4 weeks pt will reports less fatigue in her neck, report only requiring the cervical soft collar <4 days per week.    Baseline Feels need for collar most afternoons due to  fatigue    Time 4    Period Weeks    Status New    Target Date 09/14/20      PT SHORT TERM GOAL #2   Title After 4 weeks pt to demonstrate improved 5xSTS <12 seconds and without LOB.    Baseline 13.67sec c LOB on 2nd rep of 6    Time 4    Period Weeks    Status New    Target Date 09/14/20      PT SHORT TERM GOAL #3   Title After 4 weeks pt to demonstrate improve lumbopelvic motor control AEB ability to ATTEMPT SLS multiple times without onset cramping in legs.    Baseline has severe cramping limitations in hamstrings during testing  on eval    Time 4    Period Weeks    Status New    Target Date 09/14/20             PT Long Term Goals - 08/18/20 0931      PT LONG TERM GOAL #1   Title After 6 weeks pt to score >80 on FOTO survey to demonstrate improved participation in life.    Baseline 77 at eval    Time 6    Period Weeks    Status New    Target Date 09/28/20      PT LONG TERM GOAL #2   Title After 8 weeks pt to demonstrate 5xSTS in <12sec eyes closed without LOB to demonstrate improved dynamic motor control.    Baseline eyes open at eval    Time 8    Period Weeks    Status New    Target Date 10/12/20      PT LONG TERM GOAL #3   Title After 8 weeks pt to demonstrate improved balance AEB SLS >5sec bilat without use of arms for righting upon failure thereafter.    Baseline <3 sec SLS, no use of arms for righting, but arresting hamstrings cramps bilat    Time 8    Period Weeks    Status New    Target Date 10/13/20      PT LONG TERM GOAL #4   Title TBD long term balance goal    Baseline standardized balance tool deferred to future visit                 Plan - 08/21/20 1059    Clinical Impression Statement Patient educated on brace wear, need to rest when fatigued, calling physician immediately if she has any pain. Patient aware that therapists require further direction from physicians prior to direct therapeutic intervention to neck and is agreeable to  shoulder and balance interventions at this time. Awaiting further direction from Dr. Cari Caraway in regards to protocol for spine at this time. R shoulder assessed with noted limitations in R shoulder range of motion. Exercises prescribed for postural correction and ROM increase in safe positions for neck at this time. Patient demonstrated understanding at this time. Patient would benefit from skilled physical therapy to improve quality of life, will continue with current POC at this time.    Personal Factors and Comorbidities Age;Sex;Behavior Pattern;Comorbidity 1;Fitness;Time since onset of injury/illness/exacerbation    Comorbidities osteoporosis    Examination-Activity Limitations Bend;Bathing;Dressing;Lift;Reach Overhead;Stand;Stairs    Examination-Participation Restrictions Driving;Community Activity;Yard Work;Cleaning    Stability/Clinical Decision Making Unstable/Unpredictable    Rehab Potential Fair    PT Frequency 2x / week    PT Duration 12 weeks    PT Treatment/Interventions ADLs/Self Care Home Management;Cryotherapy;Electrical Stimulation;Moist Heat;DME Instruction;Gait training;Stair training;Functional mobility training;Therapeutic activities;Therapeutic exercise;Balance training;Neuromuscular re-education;Patient/family education;Passive range of motion    PT Next Visit Plan Discuss plan of PT and limitations of care, particualrly regarding return to driivng. Screen right shoulder moblity/strength for lifting activity at home, cervical extension/flexion A/ROM assessent (already permitted for [employer]); Exhaustive balance screening to address 5+ years of falls and orthopedic insult.    PT Home Exercise Plan None issued at eval; to be determine.    Consulted and Agree with Plan of Care Patient           Patient will benefit from skilled therapeutic intervention in order to improve the following deficits and impairments:  Decreased balance,Abnormal gait,Decreased activity  tolerance,Decreased knowledge of use  of DME,Decreased knowledge of precautions,Decreased mobility,Decreased range of motion,Decreased strength,Hypomobility,Increased muscle spasms,Postural dysfunction,Impaired UE functional use  Visit Diagnosis: Abnormal head movements  Repeated falls     Problem List Patient Active Problem List   Diagnosis Date Noted  . Status post reverse total shoulder replacement, right 09/24/2018  . S/P right colectomy 01/26/2018  . Adenomatous polyp of ascending colon 11/24/2017  . Generalized anxiety disorder 09/06/2017  . Irritable bowel syndrome (IBS) 09/06/2017  . Fracture of one rib, right side, subsequent encounter for fracture with routine healing 03/08/2017  . Orthostasis 06/05/2016  . Left shoulder pain 10/13/2015  . Constipation 05/30/2015  . Osteoporosis, postmenopausal 05/30/2015  . Compression fracture of L2 lumbar vertebra with delayed healing 05/29/2015  . Frequent falls 03/26/2015  . Atherosclerosis of arteries 03/26/2015  . Chest pain 02/09/2015  . Breast cancer screening 10/01/2014  . Family history of colon cancer 09/28/2014  . Viral URI with cough 06/12/2014  . Tenesmus (rectal) 06/12/2014  . TMJ (temporomandibular joint syndrome) 06/12/2014  . Varicose veins of both lower extremities without ulcer or inflammation 06/12/2014  . B12 deficiency 09/20/2013  . Fatigue 05/23/2013  . Internal hemorrhoids without complication 03/00/9233  . Insomnia 03/03/2013  . Vertigo due to cerebrovascular disease 03/03/2013  . Seronegative rheumatoid arthritis affecting lower leg ([place]) 03/03/2013   Janna Arch, PT, DPT   08/21/2020, 11:04 AM  [place] MAIN [facility] SERVICES [address] [place], [place], [address] Phone: [phone]   Fax:  [phone]  Name: Teresa Lin MRN: [id number] Date of Birth: Jan 23, 1932

## 2020-08-22 ENCOUNTER — Encounter: Payer: Medicare Other | Admitting: Speech Pathology

## 2020-08-24 ENCOUNTER — Ambulatory Visit: Payer: Medicare Other

## 2020-08-24 ENCOUNTER — Encounter: Payer: Medicare Other | Admitting: Speech Pathology

## 2020-08-24 ENCOUNTER — Other Ambulatory Visit: Payer: Self-pay

## 2020-08-24 DIAGNOSIS — M6281 Muscle weakness (generalized): Secondary | ICD-10-CM

## 2020-08-24 DIAGNOSIS — R250 Abnormal head movements: Secondary | ICD-10-CM

## 2020-08-24 DIAGNOSIS — R296 Repeated falls: Secondary | ICD-10-CM

## 2020-08-24 NOTE — Therapy (Signed)
[place] MAIN [facility] SERVICES [address] [place], [place], [address] Phone: [phone]   Fax:  [phone]  Physical Therapy Treatment  Patient Details  Name: Teresa Lin MRN: [id number] Date of Birth: 26-Nov-1931 Referring Provider (PT): [place] Shah,MD (Neurology)   Encounter Date: 08/24/2020   PT End of Session - 08/24/20 0729    Visit Number 3    Number of Visits 17    Date for PT Re-Evaluation 11/09/20    Authorization Type Medicare Parts A and B    Authorization Time Period 08/17/20-11/09/20    PT Start Time 0714    PT Stop Time 8841    PT Time Calculation (min) 40 min    Activity Tolerance Patient tolerated treatment well;No increased pain;Patient limited by fatigue    Behavior During Therapy [facility] for tasks assessed/performed           Past Medical History:  Diagnosis Date  . Anemia   . Anxiety   . Arthritis    osteoarthritis. Bilateral knee replacement, hands, meniscal tear, cervical disc disease  . Atherosclerosis   . Atypical chest pain   . Collagen vascular disease ([place])   . Colon polyp 12/15/2017  . Colon polyps   . Compression fracture of L2 ([place])   . Diverticulosis   . DVT (deep venous thrombosis) ([place])   . GERD (gastroesophageal reflux disease)   . History of seronegative inflammatory arthritis   . Hypertension   . IBS (irritable bowel syndrome)   . Internal hemorrhoids   . Phlebitis and thrombophlebitis of deep veins of lower extremities (HCC)    after surgery  . Vision abnormalities     Past Surgical History:  Procedure Laterality Date  . APPENDECTOMY    . COLONOSCOPY N/A 01/24/2020   Procedure: COLONOSCOPY;  Surgeon: Lesly Rubenstein, MD;  Location[facility] ENDOSCOPY;  Service: Endoscopy;  Laterality: N/A;  . COLONOSCOPY WITH PROPOFOL N/A 10/24/2017   Procedure: COLONOSCOPY WITH PROPOFOL;  Surgeon: Manya Silvas, MD;  Location: [facility] ENDOSCOPY;  Service: Endoscopy;  Laterality: N/A;  . COLONOSCOPY WITH  PROPOFOL N/A 11/21/2017   Procedure: COLONOSCOPY WITH PROPOFOL;  Surgeon: Manya Silvas, MD;  Location: [facility] ENDOSCOPY;  Service: Endoscopy;  Laterality: N/A;  . DILATION AND CURETTAGE OF UTERUS    . ESOPHAGOGASTRODUODENOSCOPY (EGD) WITH PROPOFOL N/A 02/25/2018   Procedure: ESOPHAGOGASTRODUODENOSCOPY (EGD) WITH PROPOFOL;  Surgeon: Manya Silvas, MD;  Location: [facility] ENDOSCOPY;  Service: Endoscopy;  Laterality: N/A;  . EYE SURGERY     bilateral catarACT  . JOINT REPLACEMENT Bilateral    knee replacement  . LAPAROSCOPIC RIGHT COLECTOMY Right 12/15/2017   Procedure: LAPAROSCOPIC RIGHT COLECTOMY;  Surgeon: Robert Bellow, MD;  Location: [facility] ORS;  Service: General;  Laterality: Right;  . REPLACEMENT TOTAL KNEE BILATERAL    . TOTAL SHOULDER ARTHROPLASTY Right 09/24/2018   Procedure: TOTAL SHOULDER ARTHROPLASTY - REVERSE RIGHT;  Surgeon: Corky Mull, MD;  Location: [facility] ORS;  Service: Orthopedics;  Laterality: Right;    There were no vitals filed for this visit.   Subjective Assessment - 08/24/20 0723    Subjective Patient is wearing her soft collar today. Has many questions about neck, informed her of conversation with Dr. Cari Caraway and her case manager at [facility].    Pertinent History HPI    Limitations Reading    How long can you sit comfortably? Neck does not limit.    How long can you stand comfortably? Neck does not limit.  How long can you walk comfortably? Can sometimes be sore, but not limited.    Currently in Pain? No/denies              Per physician Dr. Cari Caraway: AROM pain free is allowed, no limitations for weight limit, no restrictions other than avoid pain.   manual  R shoulder hypomobile with AP, PA, and inferior mobilizations grade II, 3x 10 seconds each direction.   PROM R shoulder: flexion 10x 10 second holds, abduction 10x 10 second holds, ER 10x 10 second holds, IR 10x 10 second holds  TherEx: Supine: -cervical AROM pain free with education  on remaining in pain free arc of motion 10x cervical rotation each direction; more challenging to the R than left.  -AAROM R shoulder flexion with dowel. 10x 5 second holds -chest press with dowel 10x   -ER RTB 12x -scapular retraction and depression 10x 3 second holds against table  Supine <>sit with education on log roll and safe technique for reduced torsion on cervical spine.    Seated: 1.5 dowel: row/chest press 12x. Overhead raise 12x.  Hands on knees: pull to hips 15x ; very challenging  Shoulder abduction elbow bent 12x; more limited RUE than LE Scapular retractions 10x 2 sets ER 10x  Standing: Wall posture education 10x scapular retraction against wall  Wall flexion UE slides 10x   Seated posture: educated and performed upright neutral alignment with scapular retraction and neutral cervical alignment 3x 30 seconds                    PT Education - 08/24/20 0724    Education Details exercise technique, body mechanics    Person(s) Educated Patient    Methods Explanation;Demonstration;Tactile cues;Verbal cues    Comprehension Verbalized understanding;Returned demonstration;Verbal cues required;Tactile cues required            PT Short Term Goals - 08/18/20 4098      PT SHORT TERM GOAL #1   Title After 4 weeks pt will reports less fatigue in her neck, report only requiring the cervical soft collar <4 days per week.    Baseline Feels need for collar most afternoons due to fatigue    Time 4    Period Weeks    Status New    Target Date 09/14/20      PT SHORT TERM GOAL #2   Title After 4 weeks pt to demonstrate improved 5xSTS <12 seconds and without LOB.    Baseline 13.67sec c LOB on 2nd rep of 6    Time 4    Period Weeks    Status New    Target Date 09/14/20      PT SHORT TERM GOAL #3   Title After 4 weeks pt to demonstrate improve lumbopelvic motor control AEB ability to ATTEMPT SLS multiple times without onset cramping in legs.    Baseline has  severe cramping limitations in hamstrings during testing on eval    Time 4    Period Weeks    Status New    Target Date 09/14/20             PT Long Term Goals - 08/18/20 0931      PT LONG TERM GOAL #1   Title After 6 weeks pt to score >80 on FOTO survey to demonstrate improved participation in life.    Baseline 77 at eval    Time 6    Period Weeks    Status New  Target Date 09/28/20      PT LONG TERM GOAL #2   Title After 8 weeks pt to demonstrate 5xSTS in <12sec eyes closed without LOB to demonstrate improved dynamic motor control.    Baseline eyes open at eval    Time 8    Period Weeks    Status New    Target Date 10/12/20      PT LONG TERM GOAL #3   Title After 8 weeks pt to demonstrate improved balance AEB SLS >5sec bilat without use of arms for righting upon failure thereafter.    Baseline <3 sec SLS, no use of arms for righting, but arresting hamstrings cramps bilat    Time 8    Period Weeks    Status New    Target Date 10/13/20      PT LONG TERM GOAL #4   Title TBD long term balance goal    Baseline standardized balance tool deferred to future visit                 Plan - 08/24/20 0755    Clinical Impression Statement Patient educated on what physician has relayed to PT for safe mobility of neck, AROM only, no pain. Patient has questions about return to driving, directed to [facility] her case manager at [employer] > Postural interventions tolerated well however wrist pain does limit some mobility. She requires frequent cueing for safe body mechanics due to preference for excessive head movement even with cervical brace on. Patient remains highly motivated and inquisitive throughout session.  Patient would benefit from skilled physical therapy to improve quality of life, will continue with current POC at this time.    Personal Factors and Comorbidities Age;Sex;Behavior Pattern;Comorbidity 1;Fitness;Time since onset of injury/illness/exacerbation    Comorbidities  osteoporosis    Examination-Activity Limitations Bend;Bathing;Dressing;Lift;Reach Overhead;Stand;Stairs    Examination-Participation Restrictions Driving;Community Activity;Yard Work;Cleaning    Stability/Clinical Decision Making Unstable/Unpredictable    Rehab Potential Fair    PT Frequency 2x / week    PT Duration 12 weeks    PT Treatment/Interventions ADLs/Self Care Home Management;Cryotherapy;Electrical Stimulation;Moist Heat;DME Instruction;Gait training;Stair training;Functional mobility training;Therapeutic activities;Therapeutic exercise;Balance training;Neuromuscular re-education;Patient/family education;Passive range of motion    PT Next Visit Plan Discuss plan of PT and limitations of care, particualrly regarding return to driivng. Screen right shoulder moblity/strength for lifting activity at home, cervical extension/flexion A/ROM assessent (already permitted for [employer]); Exhaustive balance screening to address 5+ years of falls and orthopedic insult.    PT Home Exercise Plan None issued at eval; to be determine.    Consulted and Agree with Plan of Care Patient           Patient will benefit from skilled therapeutic intervention in order to improve the following deficits and impairments:  Decreased balance,Abnormal gait,Decreased activity tolerance,Decreased knowledge of use of DME,Decreased knowledge of precautions,Decreased mobility,Decreased range of motion,Decreased strength,Hypomobility,Increased muscle spasms,Postural dysfunction,Impaired UE functional use  Visit Diagnosis: Abnormal head movements  Repeated falls  Muscle weakness (generalized)     Problem List Patient Active Problem List   Diagnosis Date Noted  . Status post reverse total shoulder replacement, right 09/24/2018  . S/P right colectomy 01/26/2018  . Adenomatous polyp of ascending colon 11/24/2017  . Generalized anxiety disorder 09/06/2017  . Irritable bowel syndrome (IBS) 09/06/2017  .  Fracture of one rib, right side, subsequent encounter for fracture with routine healing 03/08/2017  . Orthostasis 06/05/2016  . Left shoulder pain 10/13/2015  . Constipation 05/30/2015  . Osteoporosis, postmenopausal 05/30/2015  .  Compression fracture of L2 lumbar vertebra with delayed healing 05/29/2015  . Frequent falls 03/26/2015  . Atherosclerosis of arteries 03/26/2015  . Chest pain 02/09/2015  . Breast cancer screening 10/01/2014  . Family history of colon cancer 09/28/2014  . Viral URI with cough 06/12/2014  . Tenesmus (rectal) 06/12/2014  . TMJ (temporomandibular joint syndrome) 06/12/2014  . Varicose veins of both lower extremities without ulcer or inflammation 06/12/2014  . B12 deficiency 09/20/2013  . Fatigue 05/23/2013  . Internal hemorrhoids without complication 23/36/1224  . Insomnia 03/03/2013  . Vertigo due to cerebrovascular disease 03/03/2013  . Seronegative rheumatoid arthritis affecting lower leg ([place]) 03/03/2013   Janna Arch, PT, DPT   08/24/2020, 7:57 AM  [place] MAIN [facility] SERVICES [address] [place], [place], [address] Phone: [phone]   Fax:  [phone]  Name: Teresa Lin MRN: [id number] Date of Birth: 12/30/1931

## 2020-08-28 ENCOUNTER — Ambulatory Visit: Payer: Medicare Other

## 2020-08-28 ENCOUNTER — Other Ambulatory Visit: Payer: Self-pay

## 2020-08-28 DIAGNOSIS — R250 Abnormal head movements: Secondary | ICD-10-CM

## 2020-08-28 DIAGNOSIS — R296 Repeated falls: Secondary | ICD-10-CM

## 2020-08-28 DIAGNOSIS — M6281 Muscle weakness (generalized): Secondary | ICD-10-CM

## 2020-08-28 NOTE — Therapy (Signed)
[place] MAIN [facility] SERVICES [address] [place], [place], [address] Phone: [phone]   Fax:  [phone]  Physical Therapy Treatment  Patient Details  Name: Teresa Lin MRN: [id number] Date of Birth: 1932-03-07 Referring Provider (PT): Wallula Shah,MD (Neurology)   Encounter Date: 08/28/2020   PT End of Session - 08/28/20 0758    Visit Number 4    Number of Visits 17    Date for PT Re-Evaluation 11/09/20    Authorization Type Medicare Parts A and B    Authorization Time Period 08/17/20-11/09/20    PT Start Time 0719    PT Stop Time 1607    PT Time Calculation (min) 35 min    Activity Tolerance Patient tolerated treatment well;No increased pain;Patient limited by fatigue    Behavior During Therapy [facility] for tasks assessed/performed           Past Medical History:  Diagnosis Date  . Anemia   . Anxiety   . Arthritis    osteoarthritis. Bilateral knee replacement, hands, meniscal tear, cervical disc disease  . Atherosclerosis   . Atypical chest pain   . Collagen vascular disease ([place])   . Colon polyp 12/15/2017  . Colon polyps   . Compression fracture of L2 ([place])   . Diverticulosis   . DVT (deep venous thrombosis) ([place])   . GERD (gastroesophageal reflux disease)   . History of seronegative inflammatory arthritis   . Hypertension   . IBS (irritable bowel syndrome)   . Internal hemorrhoids   . Phlebitis and thrombophlebitis of deep veins of lower extremities (HCC)    after surgery  . Vision abnormalities     Past Surgical History:  Procedure Laterality Date  . APPENDECTOMY    . COLONOSCOPY N/A 01/24/2020   Procedure: COLONOSCOPY;  Surgeon: Lesly Rubenstein, MD;  Location: [facility] ENDOSCOPY;  Service: Endoscopy;  Laterality: N/A;  . COLONOSCOPY WITH PROPOFOL N/A 10/24/2017   Procedure: COLONOSCOPY WITH PROPOFOL;  Surgeon: Manya Silvas, MD;  Location: [facility] ENDOSCOPY;  Service: Endoscopy;  Laterality: N/A;  . COLONOSCOPY WITH  PROPOFOL N/A 11/21/2017   Procedure: COLONOSCOPY WITH PROPOFOL;  Surgeon: Manya Silvas, MD;  Location: [facility] ENDOSCOPY;  Service: Endoscopy;  Laterality: N/A;  . DILATION AND CURETTAGE OF UTERUS    . ESOPHAGOGASTRODUODENOSCOPY (EGD) WITH PROPOFOL N/A 02/25/2018   Procedure: ESOPHAGOGASTRODUODENOSCOPY (EGD) WITH PROPOFOL;  Surgeon: Manya Silvas, MD;  Location: [facility] ENDOSCOPY;  Service: Endoscopy;  Laterality: N/A;  . EYE SURGERY     bilateral catarACT  . JOINT REPLACEMENT Bilateral    knee replacement  . LAPAROSCOPIC RIGHT COLECTOMY Right 12/15/2017   Procedure: LAPAROSCOPIC RIGHT COLECTOMY;  Surgeon: Robert Bellow, MD;  Location: [facility] ORS;  Service: General;  Laterality: Right;  . REPLACEMENT TOTAL KNEE BILATERAL    . TOTAL SHOULDER ARTHROPLASTY Right 09/24/2018   Procedure: TOTAL SHOULDER ARTHROPLASTY - REVERSE RIGHT;  Surgeon: Corky Mull, MD;  Location: [facility] ORS;  Service: Orthopedics;  Laterality: Right;    There were no vitals filed for this visit.   Subjective Assessment - 08/28/20 0832    Subjective Patient forgot her neck brace again today. Is late and has many questions (same questions as last time)    Pertinent History HPI    Limitations Reading    How long can you sit comfortably? Neck does not limit.    How long can you stand comfortably? Neck does not limit.    How long can you walk comfortably?  Can sometimes be sore, but not limited.    Currently in Pain? No/denies                 Access Code: [email or web address] URL: [email or web address] Date: 08/28/2020 Prepared by: Janna Arch  Exercises Supine Cervical Rotation AROM on Pillow - 1 x daily - 7 x weekly - 2 sets - 10 reps - 5 hold Supine Cervical Sidebending - 1 x daily - 7 x weekly - 2 sets - 10 reps - 5 hold Supine Cervical Flexion Extension on Pillow - 1 x daily - 7 x weekly - 2 sets - 10 reps - 5 hold Supine Scapular Retraction - 1 x daily - 7 x weekly - 2 sets - 10 reps - 5  hold Supine Shoulder Flexion Extension AAROM with Dowel - 1 x daily - 7 x weekly - 2 sets - 10 reps - 5 hold    manual  R shoulder hypomobile with AP, PA, and inferior mobilizations grade II, 3x 10 seconds each direction.   STM with implementation of effleurage and ptrissage to cervical spine x 8 minutes for pain reduction and muscle tissue length improvements.    TherEx: Supine: -cervical AROM pain free with education on remaining in pain free arc of motion 10x cervical rotation each direction; more challenging to the R than left.  -cervical AROM side bend 10x each side; max cueing to remain in pain free ROM  -RUE AROM: flexion 10x, abduction 10x (requiring cues for sequencing)  -AAROM R shoulder flexion with dowel. 10x 5 second holds -chest press with dowel 10x  -scapular retraction and depression 10x 3 second holds against table   Supine <>sit with education on log roll and safe technique for reduced torsion on cervical spine.    Seated: RTB row 15x    Seated posture: educated and performed upright neutral alignment with scapular retraction and neutral cervical alignment 3x 30 seconds     Pt educated throughout session about proper posture and technique with exercises. Improved exercise technique, movement at target joints, use of target muscles after min to mod verbal, visual, tactile cues.     Patient has forgetting her cervical brace (soft collar) to wear today. Is not pleased that therapist is requiring use of it for balance interventions however due to her pain and instability of neck it is unsafe at this time to proceed with high level balance without use of brace. Cervical safety and education performed with heavy importance on compliance with safety. Patient remains highly motivated and inquisitive throughout session. Patient would benefit from skilled physical therapy to improve quality of life, will continue with current POC at this time.              PT  Education - 08/28/20 [phone]    Education Details cervical safety awareness    Person(s) Educated Patient    Methods Explanation;Demonstration;Verbal cues;Tactile cues    Comprehension Verbalized understanding;Returned demonstration;Verbal cues required;Tactile cues required            PT Short Term Goals - 08/18/20 0973      PT SHORT TERM GOAL #1   Title After 4 weeks pt will reports less fatigue in her neck, report only requiring the cervical soft collar <4 days per week.    Baseline Feels need for collar most afternoons due to fatigue    Time 4    Period Weeks    Status New    Target Date 09/14/20      PT  SHORT TERM GOAL #2   Title After 4 weeks pt to demonstrate improved 5xSTS <12 seconds and without LOB.    Baseline 13.67sec c LOB on 2nd rep of 6    Time 4    Period Weeks    Status New    Target Date 09/14/20      PT SHORT TERM GOAL #3   Title After 4 weeks pt to demonstrate improve lumbopelvic motor control AEB ability to ATTEMPT SLS multiple times without onset cramping in legs.    Baseline has severe cramping limitations in hamstrings during testing on eval    Time 4    Period Weeks    Status New    Target Date 09/14/20             PT Long Term Goals - 08/18/20 0931      PT LONG TERM GOAL #1   Title After 6 weeks pt to score >80 on FOTO survey to demonstrate improved participation in life.    Baseline 77 at eval    Time 6    Period Weeks    Status New    Target Date 09/28/20      PT LONG TERM GOAL #2   Title After 8 weeks pt to demonstrate 5xSTS in <12sec eyes closed without LOB to demonstrate improved dynamic motor control.    Baseline eyes open at eval    Time 8    Period Weeks    Status New    Target Date 10/12/20      PT LONG TERM GOAL #3   Title After 8 weeks pt to demonstrate improved balance AEB SLS >5sec bilat without use of arms for righting upon failure thereafter.    Baseline <3 sec SLS, no use of arms for righting, but arresting hamstrings  cramps bilat    Time 8    Period Weeks    Status New    Target Date 10/13/20      PT LONG TERM GOAL #4   Title TBD long term balance goal    Baseline standardized balance tool deferred to future visit                 Plan - 08/28/20 0837    Clinical Impression Statement Patient has forgetting her cervical brace (soft collar) to wear today. Is not pleased that therapist is requiring use of it for balance interventions however due to her pain and instability of neck it is unsafe at this time to proceed with high level balance without use of brace. Cervical safety and education performed with heavy importance on compliance with safety. Patient remains highly motivated and inquisitive throughout session. Patient would benefit from skilled physical therapy to improve quality of life, will continue with current POC at this time.    Personal Factors and Comorbidities Age;Sex;Behavior Pattern;Comorbidity 1;Fitness;Time since onset of injury/illness/exacerbation    Comorbidities osteoporosis    Examination-Activity Limitations Bend;Bathing;Dressing;Lift;Reach Overhead;Stand;Stairs    Examination-Participation Restrictions Driving;Community Activity;Yard Work;Cleaning    Stability/Clinical Decision Making Unstable/Unpredictable    Rehab Potential Fair    PT Frequency 2x / week    PT Duration 12 weeks    PT Treatment/Interventions ADLs/Self Care Home Management;Cryotherapy;Electrical Stimulation;Moist Heat;DME Instruction;Gait training;Stair training;Functional mobility training;Therapeutic activities;Therapeutic exercise;Balance training;Neuromuscular re-education;Patient/family education;Passive range of motion    PT Next Visit Plan Discuss plan of PT and limitations of care, particualrly regarding return to driivng. Screen right shoulder moblity/strength for lifting activity at home, cervical extension/flexion A/ROM assessent (already permitted for  Brett Canales); Exhaustive balance screening  to address 5+ years of falls and orthopedic insult.    PT Home Exercise Plan None issued at eval; to be determine.    Consulted and Agree with Plan of Care Patient           Patient will benefit from skilled therapeutic intervention in order to improve the following deficits and impairments:  Decreased balance,Abnormal gait,Decreased activity tolerance,Decreased knowledge of use of DME,Decreased knowledge of precautions,Decreased mobility,Decreased range of motion,Decreased strength,Hypomobility,Increased muscle spasms,Postural dysfunction,Impaired UE functional use  Visit Diagnosis: Abnormal head movements  Repeated falls  Muscle weakness (generalized)     Problem List Patient Active Problem List   Diagnosis Date Noted  . Status post reverse total shoulder replacement, right 09/24/2018  . S/P right colectomy 01/26/2018  . Adenomatous polyp of ascending colon 11/24/2017  . Generalized anxiety disorder 09/06/2017  . Irritable bowel syndrome (IBS) 09/06/2017  . Fracture of one rib, right side, subsequent encounter for fracture with routine healing 03/08/2017  . Orthostasis 06/05/2016  . Left shoulder pain 10/13/2015  . Constipation 05/30/2015  . Osteoporosis, postmenopausal 05/30/2015  . Compression fracture of L2 lumbar vertebra with delayed healing 05/29/2015  . Frequent falls 03/26/2015  . Atherosclerosis of arteries 03/26/2015  . Chest pain 02/09/2015  . Breast cancer screening 10/01/2014  . Family history of colon cancer 09/28/2014  . Viral URI with cough 06/12/2014  . Tenesmus (rectal) 06/12/2014  . TMJ (temporomandibular joint syndrome) 06/12/2014  . Varicose veins of both lower extremities without ulcer or inflammation 06/12/2014  . B12 deficiency 09/20/2013  . Fatigue 05/23/2013  . Internal hemorrhoids without complication 71/21/9758  . Insomnia 03/03/2013  . Vertigo due to cerebrovascular disease 03/03/2013  . Seronegative rheumatoid arthritis affecting lower  leg ([place]) 03/03/2013   Janna Arch, PT, DPT   08/28/2020, 8:38 AM  [place] MAIN [facility] SERVICES [address] [place], [place], [address] Phone: [phone]   Fax:  [phone]  Name: Teresa Lin MRN: [id number] Date of Birth: 1931-11-25

## 2020-08-29 ENCOUNTER — Ambulatory Visit: Payer: Medicare Other

## 2020-08-29 ENCOUNTER — Encounter: Payer: Medicare Other | Admitting: Speech Pathology

## 2020-08-31 ENCOUNTER — Ambulatory Visit: Payer: Medicare Other

## 2020-08-31 ENCOUNTER — Encounter: Payer: Medicare Other | Admitting: Speech Pathology

## 2020-08-31 ENCOUNTER — Other Ambulatory Visit: Payer: Self-pay

## 2020-08-31 DIAGNOSIS — R250 Abnormal head movements: Secondary | ICD-10-CM

## 2020-08-31 DIAGNOSIS — R296 Repeated falls: Secondary | ICD-10-CM

## 2020-08-31 DIAGNOSIS — M6281 Muscle weakness (generalized): Secondary | ICD-10-CM

## 2020-08-31 NOTE — Therapy (Signed)
[place] MAIN [facility] SERVICES [address] [place], [place], [address] Phone: [phone]   Fax:  [phone]  Physical Therapy Treatment  Patient Details  Name: Teresa Lin MRN: [id number] Date of Birth: 12-Oct-1931 Referring Provider (PT): [place] Shah,MD (Neurology)   Encounter Date: 08/31/2020   PT End of Session - 08/31/20 0723    Visit Number 5    Number of Visits 17    Date for PT Re-Evaluation 11/09/20    Authorization Type Medicare Parts A and B    Authorization Time Period 08/17/20-11/09/20    PT Start Time 0715    PT Stop Time 4580    PT Time Calculation (min) 43 min    Activity Tolerance Patient tolerated treatment well;No increased pain;Patient limited by fatigue    Behavior During Therapy [facility] for tasks assessed/performed           Past Medical History:  Diagnosis Date  . Anemia   . Anxiety   . Arthritis    osteoarthritis. Bilateral knee replacement, hands, meniscal tear, cervical disc disease  . Atherosclerosis   . Atypical chest pain   . Collagen vascular disease ([place])   . Colon polyp 12/15/2017  . Colon polyps   . Compression fracture of L2 ([place])   . Diverticulosis   . DVT (deep venous thrombosis) ([place])   . GERD (gastroesophageal reflux disease)   . History of seronegative inflammatory arthritis   . Hypertension   . IBS (irritable bowel syndrome)   . Internal hemorrhoids   . Phlebitis and thrombophlebitis of deep veins of lower extremities (HCC)    after surgery  . Vision abnormalities     Past Surgical History:  Procedure Laterality Date  . APPENDECTOMY    . COLONOSCOPY N/A 01/24/2020   Procedure: COLONOSCOPY;  Surgeon: Lesly Rubenstein, MD;  Location: [facility] ENDOSCOPY;  Service: Endoscopy;  Laterality: N/A;  . COLONOSCOPY WITH PROPOFOL N/A 10/24/2017   Procedure: COLONOSCOPY WITH PROPOFOL;  Surgeon: Manya Silvas, MD;  Location: [facility] ENDOSCOPY;  Service: Endoscopy;  Laterality: N/A;  . COLONOSCOPY WITH  PROPOFOL N/A 11/21/2017   Procedure: COLONOSCOPY WITH PROPOFOL;  Surgeon: Manya Silvas, MD;  Location: [facility] ENDOSCOPY;  Service: Endoscopy;  Laterality: N/A;  . DILATION AND CURETTAGE OF UTERUS    . ESOPHAGOGASTRODUODENOSCOPY (EGD) WITH PROPOFOL N/A 02/25/2018   Procedure: ESOPHAGOGASTRODUODENOSCOPY (EGD) WITH PROPOFOL;  Surgeon: Manya Silvas, MD;  Location: [facility] ENDOSCOPY;  Service: Endoscopy;  Laterality: N/A;  . EYE SURGERY     bilateral catarACT  . JOINT REPLACEMENT Bilateral    knee replacement  . LAPAROSCOPIC RIGHT COLECTOMY Right 12/15/2017   Procedure: LAPAROSCOPIC RIGHT COLECTOMY;  Surgeon: Robert Bellow, MD;  Location: [facility] ORS;  Service: General;  Laterality: Right;  . REPLACEMENT TOTAL KNEE BILATERAL    . TOTAL SHOULDER ARTHROPLASTY Right 09/24/2018   Procedure: TOTAL SHOULDER ARTHROPLASTY - REVERSE RIGHT;  Surgeon: Corky Mull, MD;  Location: [facility] ORS;  Service: Orthopedics;  Laterality: Right;    There were no vitals filed for this visit.   Subjective Assessment - 08/31/20 0721    Subjective Patient reports she wants to go over her HEP again. Wants to push into pain despite constant PT warning to not push to pain.    Pertinent History HPI    Limitations Reading    How long can you sit comfortably? Neck does not limit.    How long can you stand comfortably? Neck does not limit.  How long can you walk comfortably? Can sometimes be sore, but not limited.    Currently in Pain? No/denies              [facility] PT Assessment - 08/31/20 0001      Standardized Balance Assessment   Standardized Balance Assessment Berg Balance Test      Berg Balance Test   Sit to Stand Able to stand without using hands and stabilize independently    Standing Unsupported Able to stand safely 2 minutes    Sitting with Back Unsupported but Feet Supported on Floor or Stool Able to sit safely and securely 2 minutes    Stand to Sit Sits safely with minimal use of hands    Transfers  Able to transfer safely, minor use of hands    Standing Unsupported with Eyes Closed Able to stand 10 seconds with supervision    Standing Unsupported with Feet Together Able to place feet together independently and stand 1 minute safely    From Standing, Reach Forward with Outstretched Arm Can reach confidently >25 cm (10")    From Standing Position, Pick up Object from Floor Able to pick up shoe safely and easily    From Standing Position, Turn to Look Behind Over each Shoulder Looks behind one side only/other side shows less weight shift    Turn 360 Degrees Able to turn 360 degrees safely in 4 seconds or less    Standing Unsupported, Alternately Place Feet on Step/Stool Able to stand independently and complete 8 steps >20 seconds    Standing Unsupported, One Foot in Front Able to plae foot ahead of the other independently and hold 30 seconds    Standing on One Leg Able to lift leg independently and hold 5-10 seconds    Total Score 51         BERG 51/56    TherEx: Supine: -cervical AROM pain free with education on remaining in pain free arc of motion 6x 10 second hold cervical rotation each direction; more challenging to the R than [email or web address] on pain free range of motion  -cervical AROM side bend 6x 10 second hold each side; max cueing to remain in pain free ROM  -RUE AROM: flexion 10x, abduction 10x (requiring cues for sequencing)  -AAROM R shoulder flexion with dowel.10x 5 second holds -chest press with dowel 10x    Supine <>sit with education on log roll and safe technique for reduced torsion on cervical spine.  Seated: RTB row 15x  PVC pipe row 15x PVC pipe AAROM flexion 15x  Seated posture: educated and performed upright neutral alignment with scapular retraction and neutral cervical alignment 3x 30 seconds  Access Code: [email or web address] URL: [email or web address] Date: 08/30/2020 Prepared by: Janna Arch  Exercises  . Standing Single Leg Stance  with Unilateral Counter Support - 1 x daily - 7 x weekly - 2 sets - 2 reps - 30 hold . Standing Tandem Balance with Counter Support - 1 x daily - 7 x weekly - 2 sets - 2 reps - 30 hold . Standing March with Counter Support - 1 x daily - 7 x weekly - 2 sets - 10 reps - 5 hold .   Pt educated throughout session about proper posture and technique with exercises. Improved exercise technique, movement at target joints, use of target muscles after min to mod verbal, visual, tactile cues.           Patient continues to require re-education to not push into pain for  cervical ROM despite constant reminder each session indicating guarded prognosis at this time. BERG performed due to patient history will falls and safe balance interventions given for patient to perform at home for falls prevention with distinct education on need to wear brace while performing. Patient would benefit from skilled physical therapy to improve quality of life, will continue with current POC at this time            PT Education - 08/31/20 0722    Education Details DO NOT PUSH INTO PAIN EDUCATION/RE-EDUCATION    Person(s) Educated Patient    Methods Explanation;Demonstration;Tactile cues;Verbal cues    Comprehension Verbalized understanding;Returned demonstration;Verbal cues required;Tactile cues required            PT Short Term Goals - 08/18/20 5361      PT SHORT TERM GOAL #1   Title After 4 weeks pt will reports less fatigue in her neck, report only requiring the cervical soft collar <4 days per week.    Baseline Feels need for collar most afternoons due to fatigue    Time 4    Period Weeks    Status New    Target Date 09/14/20      PT SHORT TERM GOAL #2   Title After 4 weeks pt to demonstrate improved 5xSTS <12 seconds and without LOB.    Baseline 13.67sec c LOB on 2nd rep of 6    Time 4    Period Weeks    Status New    Target Date 09/14/20      PT SHORT TERM GOAL #3   Title After 4 weeks pt  to demonstrate improve lumbopelvic motor control AEB ability to ATTEMPT SLS multiple times without onset cramping in legs.    Baseline has severe cramping limitations in hamstrings during testing on eval    Time 4    Period Weeks    Status New    Target Date 09/14/20             PT Long Term Goals - 08/18/20 0931      PT LONG TERM GOAL #1   Title After 6 weeks pt to score >80 on FOTO survey to demonstrate improved participation in life.    Baseline 77 at eval    Time 6    Period Weeks    Status New    Target Date 09/28/20      PT LONG TERM GOAL #2   Title After 8 weeks pt to demonstrate 5xSTS in <12sec eyes closed without LOB to demonstrate improved dynamic motor control.    Baseline eyes open at eval    Time 8    Period Weeks    Status New    Target Date 10/12/20      PT LONG TERM GOAL #3   Title After 8 weeks pt to demonstrate improved balance AEB SLS >5sec bilat without use of arms for righting upon failure thereafter.    Baseline <3 sec SLS, no use of arms for righting, but arresting hamstrings cramps bilat    Time 8    Period Weeks    Status New    Target Date 10/13/20      PT LONG TERM GOAL #4   Title TBD long term balance goal    Baseline standardized balance tool deferred to future visit                 Plan - 08/31/20 0801    Clinical Impression Statement Patient continues  to require re-education to not push into pain for cervical ROM despite constant reminder each session indicating guarded prognosis at this time. BERG performed due to patient history will falls and safe balance interventions given for patient to perform at home for falls prevention with distinct education on need to wear brace while performing. Patient would benefit from skilled physical therapy to improve quality of life, will continue with current POC at this time    Personal Factors and Comorbidities Age;Sex;Behavior Pattern;Comorbidity 1;Fitness;Time since onset of  injury/illness/exacerbation    Comorbidities osteoporosis    Examination-Activity Limitations Bend;Bathing;Dressing;Lift;Reach Overhead;Stand;Stairs    Examination-Participation Restrictions Driving;Community Activity;Yard Work;Cleaning    Stability/Clinical Decision Making Unstable/Unpredictable    Rehab Potential Fair    PT Frequency 2x / week    PT Duration 12 weeks    PT Treatment/Interventions ADLs/Self Care Home Management;Cryotherapy;Electrical Stimulation;Moist Heat;DME Instruction;Gait training;Stair training;Functional mobility training;Therapeutic activities;Therapeutic exercise;Balance training;Neuromuscular re-education;Patient/family education;Passive range of motion    PT Next Visit Plan Discuss plan of PT and limitations of care, particualrly regarding return to driivng. Screen right shoulder moblity/strength for lifting activity at home, cervical extension/flexion A/ROM assessent (already permitted for [employer]); Exhaustive balance screening to address 5+ years of falls and orthopedic insult.    PT Home Exercise Plan None issued at eval; to be determine.    Consulted and Agree with Plan of Care Patient           Patient will benefit from skilled therapeutic intervention in order to improve the following deficits and impairments:  Decreased balance,Abnormal gait,Decreased activity tolerance,Decreased knowledge of use of DME,Decreased knowledge of precautions,Decreased mobility,Decreased range of motion,Decreased strength,Hypomobility,Increased muscle spasms,Postural dysfunction,Impaired UE functional use  Visit Diagnosis: Abnormal head movements  Repeated falls  Muscle weakness (generalized)     Problem List Patient Active Problem List   Diagnosis Date Noted  . Status post reverse total shoulder replacement, right 09/24/2018  . S/P right colectomy 01/26/2018  . Adenomatous polyp of ascending colon 11/24/2017  . Generalized anxiety disorder 09/06/2017  .  Irritable bowel syndrome (IBS) 09/06/2017  . Fracture of one rib, right side, subsequent encounter for fracture with routine healing 03/08/2017  . Orthostasis 06/05/2016  . Left shoulder pain 10/13/2015  . Constipation 05/30/2015  . Osteoporosis, postmenopausal 05/30/2015  . Compression fracture of L2 lumbar vertebra with delayed healing 05/29/2015  . Frequent falls 03/26/2015  . Atherosclerosis of arteries 03/26/2015  . Chest pain 02/09/2015  . Breast cancer screening 10/01/2014  . Family history of colon cancer 09/28/2014  . Viral URI with cough 06/12/2014  . Tenesmus (rectal) 06/12/2014  . TMJ (temporomandibular joint syndrome) 06/12/2014  . Varicose veins of both lower extremities without ulcer or inflammation 06/12/2014  . B12 deficiency 09/20/2013  . Fatigue 05/23/2013  . Internal hemorrhoids without complication 01/74/9449  . Insomnia 03/03/2013  . Vertigo due to cerebrovascular disease 03/03/2013  . Seronegative rheumatoid arthritis affecting lower leg ([place]) 03/03/2013   Janna Arch, PT, DPT   08/31/2020, 8:02 AM  [place] MAIN [facility] SERVICES [address] [place], [place], [address] Phone: [phone]   Fax:  [phone]  Name: Teresa Lin MRN: [id number] Date of Birth: [birth year]

## 2020-09-04 ENCOUNTER — Ambulatory Visit: Payer: Medicare Other

## 2020-09-04 ENCOUNTER — Other Ambulatory Visit: Payer: Self-pay

## 2020-09-04 DIAGNOSIS — R296 Repeated falls: Secondary | ICD-10-CM

## 2020-09-04 DIAGNOSIS — R250 Abnormal head movements: Secondary | ICD-10-CM

## 2020-09-04 DIAGNOSIS — M6281 Muscle weakness (generalized): Secondary | ICD-10-CM

## 2020-09-04 NOTE — Therapy (Signed)
[place] MAIN [facility] SERVICES [address] [place], [place], [address] Phone: [phone]   Fax:  [phone]  Physical Therapy Treatment  Patient Details  Name: Teresa Lin MRN: [id number] Date of Birth: 1932/03/30 Referring Provider (PT): [place] Shah,MD (Neurology)   Encounter Date: 09/04/2020   PT End of Session - 09/04/20 0725    Visit Number 6    Number of Visits 17    Date for PT Re-Evaluation 11/09/20    Authorization Type Medicare Parts A and B    Authorization Time Period 08/17/20-11/09/20    PT Start Time 0721    PT Stop Time 0759    PT Time Calculation (min) 38 min    Activity Tolerance Patient tolerated treatment well;No increased pain;Patient limited by fatigue    Behavior During Therapy [facility] for tasks assessed/performed           Past Medical History:  Diagnosis Date  . Anemia   . Anxiety   . Arthritis    osteoarthritis. Bilateral knee replacement, hands, meniscal tear, cervical disc disease  . Atherosclerosis   . Atypical chest pain   . Collagen vascular disease ([place])   . Colon polyp 12/15/2017  . Colon polyps   . Compression fracture of L2 ([place])   . Diverticulosis   . DVT (deep venous thrombosis) ([place])   . GERD (gastroesophageal reflux disease)   . History of seronegative inflammatory arthritis   . Hypertension   . IBS (irritable bowel syndrome)   . Internal hemorrhoids   . Phlebitis and thrombophlebitis of deep veins of lower extremities (HCC)    after surgery  . Vision abnormalities     Past Surgical History:  Procedure Laterality Date  . APPENDECTOMY    . COLONOSCOPY N/A 01/24/2020   Procedure: COLONOSCOPY;  Surgeon: Lesly Rubenstein, MD;  Location: [facility] ENDOSCOPY;  Service: Endoscopy;  Laterality: N/A;  . COLONOSCOPY WITH PROPOFOL N/A 10/24/2017   Procedure: COLONOSCOPY WITH PROPOFOL;  Surgeon: Manya Silvas, MD;  Location: [facility] ENDOSCOPY;  Service: Endoscopy;  Laterality: N/A;  . COLONOSCOPY WITH  PROPOFOL N/A 11/21/2017   Procedure: COLONOSCOPY WITH PROPOFOL;  Surgeon: Manya Silvas, MD;  Location: [facility] ENDOSCOPY;  Service: Endoscopy;  Laterality: N/A;  . DILATION AND CURETTAGE OF UTERUS    . ESOPHAGOGASTRODUODENOSCOPY (EGD) WITH PROPOFOL N/A 02/25/2018   Procedure: ESOPHAGOGASTRODUODENOSCOPY (EGD) WITH PROPOFOL;  Surgeon: Manya Silvas, MD;  Location: [facility] ENDOSCOPY;  Service: Endoscopy;  Laterality: N/A;  . EYE SURGERY     bilateral catarACT  . JOINT REPLACEMENT Bilateral    knee replacement  . LAPAROSCOPIC RIGHT COLECTOMY Right 12/15/2017   Procedure: LAPAROSCOPIC RIGHT COLECTOMY;  Surgeon: Robert Bellow, MD;  Location: [facility] ORS;  Service: General;  Laterality: Right;  . REPLACEMENT TOTAL KNEE BILATERAL    . TOTAL SHOULDER ARTHROPLASTY Right 09/24/2018   Procedure: TOTAL SHOULDER ARTHROPLASTY - REVERSE RIGHT;  Surgeon: Corky Mull, MD;  Location: [facility] ORS;  Service: Orthopedics;  Laterality: Right;    There were no vitals filed for this visit.   Subjective Assessment - 09/04/20 0723    Subjective Patient reports she overdid her HEP and is very sore/painful despite PT education on not pushing past pain. Patient reports she drove over here.    Pertinent History HPI    Limitations Reading    How long can you sit comfortably? Neck does not limit.    How long can you stand comfortably? Neck does not limit.  How long can you walk comfortably? Can sometimes be sore, but not limited.    Currently in Pain? Yes    Pain Score 3     Pain Location Neck    Pain Orientation Right    Pain Descriptors / Indicators Aching    Pain Type Chronic pain                   TherEx: Supine: -cervical AROM pain free with education on remaining in pain free arc of motion 10x 5 second hold cervical rotation each direction; more challenging to the R than left. education on pain free range of motion  -cervical AROM side bend 10x 5 second hold each side; max cueing to remain in  pain free ROM  -RUE AROM: flexion 10x, abduction 10x (requiring cues for sequencing)  -AAROM R shoulder flexion with dowel. 10x 5 second holds -chest press with weighted dowel (5lb) 10x      Supine <>sit with education on log roll and safe technique for reduced torsion on cervical spine.    Seated: RTB row 15x   Weighted dowel (5lb)  row 15x Weighted dowel (5lb) AAROM flexion 15x Scapular retraction and depression with upright posture 5x10 second holds Hand on knees, bring to behind back 10x Shoulder Y's 10x  Seated posture: educated and performed upright neutral alignment with scapular retraction and neutral cervical alignment 3x 30 seconds  Standing:  airex pad: static stand 60 seconds airex pad: eyes closed 30 seconds airex pad 4" step modified tandem stance 2x 30 second holds  Pt educated throughout session about proper posture and technique with exercises. Improved exercise technique, movement at target joints, use of target muscles after min to mod verbal, visual, tactile cues.                   PT Education - 09/04/20 0724    Education Details do not push into pain.    Person(s) Educated Patient    Methods Explanation;Demonstration;Tactile cues;Verbal cues    Comprehension Verbalized understanding;Returned demonstration;Verbal cues required;Tactile cues required            PT Short Term Goals - 08/18/20 0347      PT SHORT TERM GOAL #1   Title After 4 weeks pt will reports less fatigue in her neck, report only requiring the cervical soft collar <4 days per week.    Baseline Feels need for collar most afternoons due to fatigue    Time 4    Period Weeks    Status New    Target Date 09/14/20      PT SHORT TERM GOAL #2   Title After 4 weeks pt to demonstrate improved 5xSTS <12 seconds and without LOB.    Baseline 13.67sec c LOB on 2nd rep of 6    Time 4    Period Weeks    Status New    Target Date 09/14/20      PT SHORT TERM GOAL #3   Title After  4 weeks pt to demonstrate improve lumbopelvic motor control AEB ability to ATTEMPT SLS multiple times without onset cramping in legs.    Baseline has severe cramping limitations in hamstrings during testing on eval    Time 4    Period Weeks    Status New    Target Date 09/14/20             PT Long Term Goals - 08/18/20 0931      PT LONG TERM GOAL #  1   Title After 6 weeks pt to score >80 on FOTO survey to demonstrate improved participation in life.    Baseline 77 at eval    Time 6    Period Weeks    Status New    Target Date 09/28/20      PT LONG TERM GOAL #2   Title After 8 weeks pt to demonstrate 5xSTS in <12sec eyes closed without LOB to demonstrate improved dynamic motor control.    Baseline eyes open at eval    Time 8    Period Weeks    Status New    Target Date 10/12/20      PT LONG TERM GOAL #3   Title After 8 weeks pt to demonstrate improved balance AEB SLS >5sec bilat without use of arms for righting upon failure thereafter.    Baseline <3 sec SLS, no use of arms for righting, but arresting hamstrings cramps bilat    Time 8    Period Weeks    Status New    Target Date 10/13/20      PT LONG TERM GOAL #4   Title TBD long term balance goal    Baseline standardized balance tool deferred to future visit                 Plan - 09/04/20 0728    Clinical Impression Statement Patient's ROM is improving in active supported positioning with L > R. Balance interventions tolerated well with patient demonstrating good ankle righting reactions. Patient requires re-education on not pushing to pain despite education EVERY session. Patient requires re-education on shoulder mobility and reson behind interventions despite the questions being the same as the previous few sessions. Prognosis is guarded due to limited carryover and safety. Patient would benefit from skilled physical therapy to improve quality of life, will continue with current POC at this time    Personal  Factors and Comorbidities Age;Sex;Behavior Pattern;Comorbidity 1;Fitness;Time since onset of injury/illness/exacerbation    Comorbidities osteoporosis    Examination-Activity Limitations Bend;Bathing;Dressing;Lift;Reach Overhead;Stand;Stairs    Examination-Participation Restrictions Driving;Community Activity;Yard Work;Cleaning    Stability/Clinical Decision Making Unstable/Unpredictable    Rehab Potential Fair    PT Frequency 2x / week    PT Duration 12 weeks    PT Treatment/Interventions ADLs/Self Care Home Management;Cryotherapy;Electrical Stimulation;Moist Heat;DME Instruction;Gait training;Stair training;Functional mobility training;Therapeutic activities;Therapeutic exercise;Balance training;Neuromuscular re-education;Patient/family education;Passive range of motion    PT Next Visit Plan Discuss plan of PT and limitations of care, particualrly regarding return to driivng. Screen right shoulder moblity/strength for lifting activity at home, cervical extension/flexion A/ROM assessent (already permitted for [employer]); Exhaustive balance screening to address 5+ years of falls and orthopedic insult.    PT Home Exercise Plan None issued at eval; to be determine.    Consulted and Agree with Plan of Care Patient           Patient will benefit from skilled therapeutic intervention in order to improve the following deficits and impairments:  Decreased balance,Abnormal gait,Decreased activity tolerance,Decreased knowledge of use of DME,Decreased knowledge of precautions,Decreased mobility,Decreased range of motion,Decreased strength,Hypomobility,Increased muscle spasms,Postural dysfunction,Impaired UE functional use  Visit Diagnosis: Abnormal head movements  Repeated falls  Muscle weakness (generalized)     Problem List Patient Active Problem List   Diagnosis Date Noted  . Status post reverse total shoulder replacement, right 09/24/2018  . S/P right colectomy 01/26/2018  .  Adenomatous polyp of ascending colon 11/24/2017  . Generalized anxiety disorder 09/06/2017  . Irritable bowel syndrome (IBS)  09/06/2017  . Fracture of one rib, right side, subsequent encounter for fracture with routine healing 03/08/2017  . Orthostasis 06/05/2016  . Left shoulder pain 10/13/2015  . Constipation 05/30/2015  . Osteoporosis, postmenopausal 05/30/2015  . Compression fracture of L2 lumbar vertebra with delayed healing 05/29/2015  . Frequent falls 03/26/2015  . Atherosclerosis of arteries 03/26/2015  . Chest pain 02/09/2015  . Breast cancer screening 10/01/2014  . Family history of colon cancer 09/28/2014  . Viral URI with cough 06/12/2014  . Tenesmus (rectal) 06/12/2014  . TMJ (temporomandibular joint syndrome) 06/12/2014  . Varicose veins of both lower extremities without ulcer or inflammation 06/12/2014  . B12 deficiency 09/20/2013  . Fatigue 05/23/2013  . Internal hemorrhoids without complication [id number]  . Insomnia 03/03/2013  . Vertigo due to cerebrovascular disease 03/03/2013  . Seronegative rheumatoid arthritis affecting lower leg ([place]) 03/03/2013   Janna Arch, PT, DPT   09/04/2020, 7:59 AM  [place] MAIN [facility] SERVICES [address] [place], [place], [address] Phone: [phone]   Fax:  [phone]  Name: DAIZY CHARD MRN: [id number] Date of Birth: January 23, 1932

## 2020-09-05 ENCOUNTER — Ambulatory Visit: Payer: Medicare Other

## 2020-09-05 ENCOUNTER — Encounter: Payer: Medicare Other | Admitting: Speech Pathology

## 2020-09-06 ENCOUNTER — Telehealth: Payer: Self-pay | Admitting: Oncology

## 2020-09-06 NOTE — Telephone Encounter (Signed)
Pt called to see why she needed labs drawn when she had labs drawn at her PCP office 4 weeks ago. Informed pt that speciality clinic tend to draw a different set of labs than those at a PCP office. Pt expressed understanding and will report to appt on 5/2.

## 2020-09-07 ENCOUNTER — Encounter: Payer: Medicare Other | Admitting: Speech Pathology

## 2020-09-07 ENCOUNTER — Ambulatory Visit: Payer: Medicare Other

## 2020-09-07 ENCOUNTER — Other Ambulatory Visit: Payer: Self-pay

## 2020-09-07 DIAGNOSIS — R296 Repeated falls: Secondary | ICD-10-CM

## 2020-09-07 DIAGNOSIS — R250 Abnormal head movements: Secondary | ICD-10-CM

## 2020-09-07 DIAGNOSIS — M6281 Muscle weakness (generalized): Secondary | ICD-10-CM

## 2020-09-07 NOTE — Therapy (Signed)
[place] MAIN [name] SERVICES [address] [place], [place], [address] Phone: [phone]   Fax:  [phone]  Physical Therapy Treatment  Patient Details  Name: Teresa Lin MRN: [id number] Date of Birth: [birth year] Referring Provider (PT): Plainview Shah,MD (Neurology)   Encounter Date: 09/07/2020   PT End of Session - 09/07/20 0724    Visit Number 7    Number of Visits 17    Date for PT Re-Evaluation 11/09/20    Authorization Type Medicare Parts A and B    Authorization Time Period 08/17/20-11/09/20    PT Start Time 0714    PT Stop Time 0758    PT Time Calculation (min) 44 min    Activity Tolerance Patient tolerated treatment well;No increased pain;Patient limited by fatigue    Behavior During Therapy [facility] for tasks assessed/performed           Past Medical History:  Diagnosis Date  . Anemia   . Anxiety   . Arthritis    osteoarthritis. Bilateral knee replacement, hands, meniscal tear, cervical disc disease  . Atherosclerosis   . Atypical chest pain   . Collagen vascular disease ([place])   . Colon polyp 12/15/2017  . Colon polyps   . Compression fracture of L2 ([place])   . Diverticulosis   . DVT (deep venous thrombosis) ([place])   . GERD (gastroesophageal reflux disease)   . History of seronegative inflammatory arthritis   . Hypertension   . IBS (irritable bowel syndrome)   . Internal hemorrhoids   . Phlebitis and thrombophlebitis of deep veins of lower extremities (HCC)    after surgery  . Vision abnormalities     Past Surgical History:  Procedure Laterality Date  . APPENDECTOMY    . COLONOSCOPY N/A 01/24/2020   Procedure: COLONOSCOPY;  Surgeon: Lesly Rubenstein, MD;  Location: [facility] ENDOSCOPY;  Service: Endoscopy;  Laterality: N/A;  . COLONOSCOPY WITH PROPOFOL N/A 10/24/2017   Procedure: COLONOSCOPY WITH PROPOFOL;  Surgeon: Manya Silvas, MD;  Location: [facility] ENDOSCOPY;  Service: Endoscopy;  Laterality: N/A;  . COLONOSCOPY WITH  PROPOFOL N/A 11/21/2017   Procedure: COLONOSCOPY WITH PROPOFOL;  Surgeon: Manya Silvas, MD;  Location: [facility] ENDOSCOPY;  Service: Endoscopy;  Laterality: N/A;  . DILATION AND CURETTAGE OF UTERUS    . ESOPHAGOGASTRODUODENOSCOPY (EGD) WITH PROPOFOL N/A 02/25/2018   Procedure: ESOPHAGOGASTRODUODENOSCOPY (EGD) WITH PROPOFOL;  Surgeon: Manya Silvas, MD;  Location: [facility] ENDOSCOPY;  Service: Endoscopy;  Laterality: N/A;  . EYE SURGERY     bilateral catarACT  . JOINT REPLACEMENT Bilateral    knee replacement  . LAPAROSCOPIC RIGHT COLECTOMY Right 12/15/2017   Procedure: LAPAROSCOPIC RIGHT COLECTOMY;  Surgeon: Robert Bellow, MD;  Location: [facility] ORS;  Service: General;  Laterality: Right;  . REPLACEMENT TOTAL KNEE BILATERAL    . TOTAL SHOULDER ARTHROPLASTY Right 09/24/2018   Procedure: TOTAL SHOULDER ARTHROPLASTY - REVERSE RIGHT;  Surgeon: Corky Mull, MD;  Location: [facility] ORS;  Service: Orthopedics;  Laterality: Right;    There were no vitals filed for this visit.   Subjective Assessment - 09/07/20 0721    Subjective Patient reports she did her balance HEP but not her neck one. No falls or LOB since last session.    Pertinent History HPI    Limitations Reading    How long can you sit comfortably? Neck does not limit.    How long can you stand comfortably? Neck does not limit.    How long can you walk  comfortably? Can sometimes be sore, but not limited.    Currently in Pain? Yes    Pain Score 2     Pain Location Neck    Pain Orientation Right    Pain Descriptors / Indicators Aching    Pain Type Chronic pain    Pain Onset More than a month ago    Pain Frequency Constant                    TherEx: Supine: -cervical AROM pain free with education on remaining in pain free arc of motion 8x 5 second hold cervical rotation each direction; more challenging to the R than left. education on pain free range of motion  -cervical AROM side bend 5x 5 second hold each side; max cueing  to remain in pain free ROM  -RUE AROM: flexion 10x, abduction 10x (requiring cues for sequencing), ER with focus on pectoral stretch (hold 8 seconds) -AAROM R shoulder flexion with 1.5 dowel. 10x 5 second holds      Supine <>sit with education on log roll and safe technique for reduced torsion on cervical spine.    Seated:  Weighted dowel (1.5b)  row 15x Weighted dowel (1.5lb) AAROM flexion 15x Scapular retraction and depression with upright posture 5x10 second holds Hand on knees, bring to behind back 10x Shoulder Y's 10x  Seated posture: educated and performed upright neutral alignment with scapular retraction and neutral cervical alignment 3x 30 seconds  STM to cervical paraspinals and upper trap with implementation of effleurage and ptrissage x 11 minutes   Pt educated throughout session about proper posture and technique with exercises. Improved exercise technique, movement at target joints, use of target muscles after min to mod verbal, visual, tactile cues.                     PT Education - 09/07/20 0723    Education Details exercise technique, neck precautions, body mechanics    Person(s) Educated Patient    Methods Explanation;Demonstration;Tactile cues;Verbal cues    Comprehension Verbalized understanding;Returned demonstration;Verbal cues required;Tactile cues required            PT Short Term Goals - 08/18/20 4098      PT SHORT TERM GOAL #1   Title After 4 weeks pt will reports less fatigue in her neck, report only requiring the cervical soft collar <4 days per week.    Baseline Feels need for collar most afternoons due to fatigue    Time 4    Period Weeks    Status New    Target Date 09/14/20      PT SHORT TERM GOAL #2   Title After 4 weeks pt to demonstrate improved 5xSTS <12 seconds and without LOB.    Baseline 13.67sec c LOB on 2nd rep of 6    Time 4    Period Weeks    Status New    Target Date 09/14/20      PT SHORT TERM GOAL #3    Title After 4 weeks pt to demonstrate improve lumbopelvic motor control AEB ability to ATTEMPT SLS multiple times without onset cramping in legs.    Baseline has severe cramping limitations in hamstrings during testing on eval    Time 4    Period Weeks    Status New    Target Date 09/14/20             PT Long Term Goals - 08/18/20 1191  PT LONG TERM GOAL #1   Title After 6 weeks pt to score >80 on FOTO survey to demonstrate improved participation in life.    Baseline 77 at eval    Time 6    Period Weeks    Status New    Target Date 09/28/20      PT LONG TERM GOAL #2   Title After 8 weeks pt to demonstrate 5xSTS in <12sec eyes closed without LOB to demonstrate improved dynamic motor control.    Baseline eyes open at eval    Time 8    Period Weeks    Status New    Target Date 10/12/20      PT LONG TERM GOAL #3   Title After 8 weeks pt to demonstrate improved balance AEB SLS >5sec bilat without use of arms for righting upon failure thereafter.    Baseline <3 sec SLS, no use of arms for righting, but arresting hamstrings cramps bilat    Time 8    Period Weeks    Status New    Target Date 10/13/20      PT LONG TERM GOAL #4   Title TBD long term balance goal    Baseline standardized balance tool deferred to future visit                 Plan - 09/07/20 0746    Clinical Impression Statement Patient is highly motivated to progress functional mobility and stability. She requires maximal cueing for body mechanics, safety awareness, and cervical precautions. She is inquisitive about driving tests and will be taking one mid may. Education on wearing brace for physical activity re-emphasized this session.  Patient would benefit from skilled physical therapy to improve quality of life, will continue with current POC at this time    Personal Factors and Comorbidities Age;Sex;Behavior Pattern;Comorbidity 1;Fitness;Time since onset of injury/illness/exacerbation     Comorbidities osteoporosis    Examination-Activity Limitations Bend;Bathing;Dressing;Lift;Reach Overhead;Stand;Stairs    Examination-Participation Restrictions Driving;Community Activity;Yard Work;Cleaning    Stability/Clinical Decision Making Unstable/Unpredictable    Rehab Potential Fair    PT Frequency 2x / week    PT Duration 12 weeks    PT Treatment/Interventions ADLs/Self Care Home Management;Cryotherapy;Electrical Stimulation;Moist Heat;DME Instruction;Gait training;Stair training;Functional mobility training;Therapeutic activities;Therapeutic exercise;Balance training;Neuromuscular re-education;Patient/family education;Passive range of motion    PT Next Visit Plan Discuss plan of PT and limitations of care, particualrly regarding return to driivng. Screen right shoulder moblity/strength for lifting activity at home, cervical extension/flexion A/ROM assessent (already permitted for [employer]); Exhaustive balance screening to address 5+ years of falls and orthopedic insult.    PT Home Exercise Plan None issued at eval; to be determine.    Consulted and Agree with Plan of Care Patient           Patient will benefit from skilled therapeutic intervention in order to improve the following deficits and impairments:  Decreased balance,Abnormal gait,Decreased activity tolerance,Decreased knowledge of use of DME,Decreased knowledge of precautions,Decreased mobility,Decreased range of motion,Decreased strength,Hypomobility,Increased muscle spasms,Postural dysfunction,Impaired UE functional use  Visit Diagnosis: Abnormal head movements  Repeated falls  Muscle weakness (generalized)     Problem List Patient Active Problem List   Diagnosis Date Noted  . Status post reverse total shoulder replacement, right 09/24/2018  . S/P right colectomy 01/26/2018  . Adenomatous polyp of ascending colon 11/24/2017  . Generalized anxiety disorder 09/06/2017  . Irritable bowel syndrome (IBS)  09/06/2017  . Fracture of one rib, right side, subsequent encounter for fracture with routine  healing 03/08/2017  . Orthostasis 06/05/2016  . Left shoulder pain 10/13/2015  . Constipation 05/30/2015  . Osteoporosis, postmenopausal 05/30/2015  . Compression fracture of L2 lumbar vertebra with delayed healing 05/29/2015  . Frequent falls 03/26/2015  . Atherosclerosis of arteries 03/26/2015  . Chest pain 02/09/2015  . Breast cancer screening 10/01/2014  . Family history of colon cancer 09/28/2014  . Viral URI with cough 06/12/2014  . Tenesmus (rectal) 06/12/2014  . TMJ (temporomandibular joint syndrome) 06/12/2014  . Varicose veins of both lower extremities without ulcer or inflammation 06/12/2014  . B12 deficiency 09/20/2013  . Fatigue 05/23/2013  . Internal hemorrhoids without complication 25/85/2778  . Insomnia 03/03/2013  . Vertigo due to cerebrovascular disease 03/03/2013  . Seronegative rheumatoid arthritis affecting lower leg ([place]) 03/03/2013   Janna Arch, PT, DPT   09/07/2020, 7:58 AM  [place] MAIN [facility] SERVICES [address] [place], [place], [address] Phone: [phone]   Fax:  [phone]  Name: IZABELL SCHALK MRN: [id number] Date of Birth: 01/17/1932

## 2020-09-11 ENCOUNTER — Inpatient Hospital Stay (HOSPITAL_BASED_OUTPATIENT_CLINIC_OR_DEPARTMENT_OTHER): Payer: Medicare Other | Admitting: Oncology

## 2020-09-11 ENCOUNTER — Inpatient Hospital Stay: Payer: Medicare Other | Attending: Oncology

## 2020-09-11 ENCOUNTER — Ambulatory Visit: Payer: Medicare Other

## 2020-09-11 ENCOUNTER — Encounter: Payer: Self-pay | Admitting: Oncology

## 2020-09-11 ENCOUNTER — Other Ambulatory Visit: Payer: Self-pay

## 2020-09-11 VITALS — BP 145/73 | HR 66 | Temp 97.6°F | Resp 17 | Ht 66.0 in | Wt 137.6 lb

## 2020-09-11 DIAGNOSIS — Z7901 Long term (current) use of anticoagulants: Secondary | ICD-10-CM | POA: Diagnosis not present

## 2020-09-11 DIAGNOSIS — Z86711 Personal history of pulmonary embolism: Secondary | ICD-10-CM | POA: Insufficient documentation

## 2020-09-11 DIAGNOSIS — I2699 Other pulmonary embolism without acute cor pulmonale: Secondary | ICD-10-CM

## 2020-09-11 LAB — CBC WITH DIFFERENTIAL/PLATELET
Abs Immature Granulocytes: 0.02 10*3/uL (ref 0.00–0.07)
Basophils Absolute: 0 10*3/uL (ref 0.0–0.1)
Basophils Relative: 0 %
Eosinophils Absolute: 0.1 10*3/uL (ref 0.0–0.5)
Eosinophils Relative: 1 %
HCT: 43 % (ref 36.0–46.0)
Hemoglobin: 14.2 g/dL (ref 12.0–15.0)
Immature Granulocytes: 0 %
Lymphocytes Relative: 18 %
Lymphs Abs: 1.1 10*3/uL (ref 0.7–4.0)
MCH: 32.4 pg (ref 26.0–34.0)
MCHC: 33 g/dL (ref 30.0–36.0)
MCV: 98.2 fL (ref 80.0–100.0)
Monocytes Absolute: 0.6 10*3/uL (ref 0.1–1.0)
Monocytes Relative: 10 %
Neutro Abs: 4.4 10*3/uL (ref 1.7–7.7)
Neutrophils Relative %: 71 %
Platelets: 236 10*3/uL (ref 150–400)
RBC: 4.38 MIL/uL (ref 3.87–5.11)
RDW: 13.3 % (ref 11.5–15.5)
WBC: 6.2 10*3/uL (ref 4.0–10.5)
nRBC: 0 % (ref 0.0–0.2)

## 2020-09-11 LAB — COMPREHENSIVE METABOLIC PANEL
ALT: 19 U/L (ref 0–44)
AST: 27 U/L (ref 15–41)
Albumin: 4.3 g/dL (ref 3.5–5.0)
Alkaline Phosphatase: 49 U/L (ref 38–126)
Anion gap: 12 (ref 5–15)
BUN: 16 mg/dL (ref 8–23)
CO2: 30 mmol/L (ref 22–32)
Calcium: 9.6 mg/dL (ref 8.9–10.3)
Chloride: 99 mmol/L (ref 98–111)
Creatinine, Ser: 0.57 mg/dL (ref 0.44–1.00)
GFR, Estimated: >60 mL/min (ref >60)
Glucose, Bld: 98 mg/dL (ref 70–99)
Potassium: 5.1 mmol/L (ref 3.5–5.1)
Sodium: 141 mmol/L (ref 135–145)
Total Bilirubin: 0.7 mg/dL (ref 0.3–1.2)
Total Protein: 7.1 g/dL (ref 6.5–8.1)

## 2020-09-11 NOTE — Progress Notes (Signed)
Hematology/Oncology Consult note [facility]  [phone] [phone]  Patient Care Team: Adin Hector, MD as PCP - General (Internal Medicine) Bary Castilla [place] Gleason, MD (General Surgery)   Name of the patient: Teresa Lin  [id number]  [birth year]   Date of visit: 09/11/20  Diagnosis-history of subsegmental pulmonary embolism in September 2021  Chief complaint/ Reason for visit-routine follow-up of PE  Heme/Onc history: patient is a 87 year old female with a past medical history significant for osteoporosis, anxiety disorder and has had frequent falls in the past.  She presented to the ER with Pleuritic chest pain and underwent a chest x-ray which was essentially normal and was discharged.  She later saw Dr. Caryl Comes who ordered a CT angio chest to rule out PE.  CT showed a isolated subsegmental pulmonary embolus in the left lower lobe.  Patient is currently on Eliquis for the same and has been referred to [place] for further recommendations.  She has not had any prior history of DVT or PE.  No significant family history of thromboembolism.  No preceding trauma or immobility or long distance trips.  She is not taking any hormone replacement therapy.    Interval history-patient has been on Eliquis for about 6 months now.  She reports having another fall sometime in February 2022 and sustained a C1 vertebral fracture.  Has occasional bilateral ankle swelling which is resolved after leg elevation she is doing well for her age  ECOG PS- 1 Pain scale- 0   Review of systems- Review of Systems  Constitutional: Negative for chills, fever, malaise/fatigue and weight loss.  HENT: Negative for congestion, ear discharge and nosebleeds.   Eyes: Negative for blurred vision.  Respiratory: Negative for cough, hemoptysis, sputum production, shortness of breath and wheezing.   Cardiovascular: Negative for chest pain, palpitations, orthopnea and claudication.   Gastrointestinal: Negative for abdominal pain, blood in stool, constipation, diarrhea, heartburn, melena, nausea and vomiting.  Genitourinary: Negative for dysuria, flank pain, frequency, hematuria and urgency.  Musculoskeletal: Negative for back pain, joint pain and myalgias.  Skin: Negative for rash.  Neurological: Negative for dizziness, tingling, focal weakness, seizures, weakness and headaches.  Endo/Heme/Allergies: Does not bruise/bleed easily.  Psychiatric/Behavioral: Negative for depression and suicidal ideas. The patient does not have insomnia.       Allergies  Allergen Reactions  . Infliximab Other (See Comments)    Shaking, pain  . Lidocaine-Menthol   . Synvisc [[place]ylan G-F 20] Swelling     Past Medical History:  Diagnosis Date  . Anemia   . Anxiety   . Arthritis    osteoarthritis. Bilateral knee replacement, hands, meniscal tear, cervical disc disease  . Atherosclerosis   . Atypical chest pain   . Collagen vascular disease ([place])   . Colon polyp 12/15/2017  . Colon polyps   . Compression fracture of L2 ([place])   . Diverticulosis   . DVT (deep venous thrombosis) ([place])   . GERD (gastroesophageal reflux disease)   . History of seronegative inflammatory arthritis   . Hypertension   . IBS (irritable bowel syndrome)   . Internal hemorrhoids   . Phlebitis and thrombophlebitis of deep veins of lower extremities (HCC)    after surgery  . Vision abnormalities      Past Surgical History:  Procedure Laterality Date  . APPENDECTOMY    . COLONOSCOPY N/A 01/24/2020   Procedure: COLONOSCOPY;  Surgeon: Lesly Rubenstein, MD;  Location: [facility] ENDOSCOPY;  Service: Endoscopy;  Laterality:  N/A;  . COLONOSCOPY WITH PROPOFOL N/A 10/24/2017   Procedure: COLONOSCOPY WITH PROPOFOL;  Surgeon: Manya Silvas, MD;  Location: [facility] ENDOSCOPY;  Service: Endoscopy;  Laterality: N/A;  . COLONOSCOPY WITH PROPOFOL N/A 11/21/2017   Procedure: COLONOSCOPY WITH PROPOFOL;  Surgeon: Manya Silvas, MD;  Location: [facility] ENDOSCOPY;  Service: Endoscopy;  Laterality: N/A;  . DILATION AND CURETTAGE OF UTERUS    . ESOPHAGOGASTRODUODENOSCOPY (EGD) WITH PROPOFOL N/A 02/25/2018   Procedure: ESOPHAGOGASTRODUODENOSCOPY (EGD) WITH PROPOFOL;  Surgeon: Manya Silvas, MD;  Location: [facility] ENDOSCOPY;  Service: Endoscopy;  Laterality: N/A;  . EYE SURGERY     bilateral catarACT  . JOINT REPLACEMENT Bilateral    knee replacement  . LAPAROSCOPIC RIGHT COLECTOMY Right 12/15/2017   Procedure: LAPAROSCOPIC RIGHT COLECTOMY;  Surgeon: Robert Bellow, MD;  Location: [facility] ORS;  Service: General;  Laterality: Right;  . REPLACEMENT TOTAL KNEE BILATERAL    . TOTAL SHOULDER ARTHROPLASTY Right 09/24/2018   Procedure: TOTAL SHOULDER ARTHROPLASTY - REVERSE RIGHT;  Surgeon: Corky Mull, MD;  Location: [facility] ORS;  Service: Orthopedics;  Laterality: Right;    Social History   Socioeconomic History  . Marital status: Widowed    Spouse name: Not on file  . Number of children: Not on file  . Years of education: Not on file  . Highest education level: Not on file  Occupational History  . Not on file  Tobacco Use  . Smoking status: Never Smoker  . Smokeless tobacco: Never Used  Vaping Use  . Vaping Use: Never used  Substance and Sexual Activity  . Alcohol use: Yes    Alcohol/week: 2.0 standard drinks    Types: 2 Cans of beer per week    Comment: occasionally   . Drug use: No  . Sexual activity: Never  Other Topics Concern  . Not on file  Social History Narrative  . Not on file   Social Determinants of Health   Financial Resource Strain: Not on file  Food Insecurity: Not on file  Transportation Needs: Not on file  Physical Activity: Not on file  Stress: Not on file  Social Connections: Not on file  Intimate Partner Violence: Not on file    Family History  Problem Relation Age of Onset  . Hypertension Mother   . Cancer Mother 64       colon ca  . Heart attack Mother   .  Hypertension Father   . Heart attack Father   . Hypertension Sister   . Heart attack Sister   . Multiple sclerosis Daughter   . Multiple sclerosis Son   . Multiple sclerosis Son      Current Outpatient Medications:  .  Abatacept (ORENCIA IV), Inject into the vein every 30 (thirty) days., Disp: , Rfl:  .  APIXABAN (ELIQUIS) VTE STARTER PACK (10MG  AND 5MG ), Take as directed on package: start with two-5mg  tablets twice daily for 7 days. On day 8, switch to one-5mg  tablet twice daily., Disp: 1 each, Rfl: 0 .  aspirin EC 325 MG tablet, Take 1 tablet (325 mg total) by mouth daily., Disp: 30 tablet, Rfl: 0 .  calcium-vitamin D (OSCAL WITH D) 500-200 MG-UNIT per tablet, Take 1 tablet by mouth daily. , Disp: , Rfl:  .  folic acid (FOLVITE) 1 MG tablet, Take 2 mg by mouth daily. , Disp: , Rfl:  .  hydroxychloroquine (PLAQUENIL) 200 MG tablet, Take 200 mg by mouth daily., Disp: , Rfl:  .  methotrexate 50  MG/2ML injection, Inject 20 mg into the skin every Sunday. , Disp: , Rfl:  .  Multiple Vitamins-Minerals (MULTIVITAMIN WITH MINERALS) tablet, Take 1 tablet by mouth daily. Centrum Silver, Disp: , Rfl:  .  Polyethyl Glycol-Propyl Glycol 0.4-0.3 % SOLN, Place 1 drop into both eyes 3 (three) times daily as needed (for eye burning/irritation)., Disp: , Rfl:   Physical exam:  Vitals:   09/11/20 1124  BP: (!) 145/73  Pulse: 66  Resp: 17  Temp: 97.6 F (36.4 C)  SpO2: 100%  Weight: 137 lb 9.6 oz (62.4 kg)  Height: 5\' 6"  (1.676 m)   Physical Exam Constitutional:      General: She is not in acute distress. Cardiovascular:     Rate and Rhythm: Normal rate and regular rhythm.     Heart sounds: Normal heart sounds.  Pulmonary:     Effort: Pulmonary effort is normal.     Breath sounds: Normal breath sounds.  Musculoskeletal:     Comments: Trace bilateral ankle edema  Skin:    General: Skin is warm and dry.  Neurological:     Mental Status: She is alert and oriented to person, place, and time.       CMP Latest Ref Rng & Units 09/11/2020  Glucose 70 - 99 mg/dL 98  BUN 8 - 23 mg/dL 16  Creatinine 0.44 - 1.00 mg/dL 0.57  Sodium 135 - 145 mmol/L 141  Potassium 3.5 - 5.1 mmol/L 5.1  Chloride 98 - 111 mmol/L 99  CO2 22 - 32 mmol/L 30  Calcium 8.9 - 10.3 mg/dL 9.6  Total Protein 6.5 - 8.1 g/dL 7.1  Total Bilirubin 0.3 - 1.2 mg/dL 0.7  Alkaline Phos 38 - 126 U/L 49  AST 15 - 41 U/L 27  ALT 0 - 44 U/L 19   CBC Latest Ref Rng & Units 09/11/2020  WBC 4.0 - 10.5 K/uL 6.2  Hemoglobin 12.0 - 15.0 g/dL 14.2  Hematocrit 36.0 - 46.0 % 43.0  Platelets 150 - 400 K/uL 236    Assessment and plan- Patient is a 87 y.o. female with history of pulmonary embolism in September 2021 on Eliquis here for routine follow-up  Patient had a small subsegmental pulmonary embolism back in September 2021 for which she has completed 6 months of anticoagulation.  Given her age as well as recent fall and propensity for falls in general I would like her to stop taking her Eliquis at this time.  Since this was a subsegmental pulmonary embolism I do not feel that this warrants lifelong anticoagulation.  If patient were to have any recurrent symptoms of bilateral lower extremity swelling, pain, symptoms such as shortness of breath or pleuritic chest pain she would need further evaluation for recurrent DVT/PE.  If patient were to have a recurrent episode of thrombosis in the future we would consider reinitiating anticoagulation at that time.  Patient can continue to follow-up with Dr. Ramonita Lab at this time and referred to [place] in the future for any questions or concerns   Visit Diagnosis 1. History of pulmonary embolism      Dr. Randa Evens, MD, MPH [facility] [phone] 09/11/2020 9:11 PM

## 2020-09-12 ENCOUNTER — Encounter: Payer: Medicare Other | Admitting: Speech Pathology

## 2020-09-14 ENCOUNTER — Ambulatory Visit: Payer: Medicare Other | Attending: Internal Medicine

## 2020-09-14 ENCOUNTER — Encounter: Payer: Medicare Other | Admitting: Speech Pathology

## 2020-09-14 ENCOUNTER — Other Ambulatory Visit: Payer: Self-pay

## 2020-09-14 DIAGNOSIS — R250 Abnormal head movements: Secondary | ICD-10-CM | POA: Diagnosis present

## 2020-09-14 DIAGNOSIS — R296 Repeated falls: Secondary | ICD-10-CM | POA: Insufficient documentation

## 2020-09-14 DIAGNOSIS — M6281 Muscle weakness (generalized): Secondary | ICD-10-CM

## 2020-09-14 NOTE — Therapy (Signed)
[place] MAIN [facility] SERVICES [address] [place], [place], [address] Phone: [phone]   Fax:  [phone]  Physical Therapy Treatment  Patient Details  Name: Teresa Lin MRN: [id number] Date of Birth: Jun 22, 1931 Referring Provider (PT): Sumpter Shah,MD (Neurology)   Encounter Date: 09/14/2020   PT End of Session - 09/14/20 0723    Visit Number 8    Number of Visits 17    Date for PT Re-Evaluation 11/09/20    Authorization Type Medicare Parts A and B    Authorization Time Period 08/17/20-11/09/20    PT Start Time 0720    PT Stop Time 6812    PT Time Calculation (min) 39 min    Activity Tolerance Patient tolerated treatment well;No increased pain;Patient limited by fatigue    Behavior During Therapy [facility] for tasks assessed/performed           Past Medical History:  Diagnosis Date  . Anemia   . Anxiety   . Arthritis    osteoarthritis. Bilateral knee replacement, hands, meniscal tear, cervical disc disease  . Atherosclerosis   . Atypical chest pain   . Collagen vascular disease ([place])   . Colon polyp 12/15/2017  . Colon polyps   . Compression fracture of L2 ([place])   . Diverticulosis   . DVT (deep venous thrombosis) ([place])   . GERD (gastroesophageal reflux disease)   . History of seronegative inflammatory arthritis   . Hypertension   . IBS (irritable bowel syndrome)   . Internal hemorrhoids   . Phlebitis and thrombophlebitis of deep veins of lower extremities (HCC)    after surgery  . Vision abnormalities     Past Surgical History:  Procedure Laterality Date  . APPENDECTOMY    . COLONOSCOPY N/A 01/24/2020   Procedure: COLONOSCOPY;  Surgeon: Lesly Rubenstein, MD;  Location: [facility] ENDOSCOPY;  Service: Endoscopy;  Laterality: N/A;  . COLONOSCOPY WITH PROPOFOL N/A 10/24/2017   Procedure: COLONOSCOPY WITH PROPOFOL;  Surgeon: Manya Silvas, MD;  Location: [facility] ENDOSCOPY;  Service: Endoscopy;  Laterality: N/A;  . COLONOSCOPY WITH  PROPOFOL N/A 11/21/2017   Procedure: COLONOSCOPY WITH PROPOFOL;  Surgeon: Manya Silvas, MD;  Location: [facility] ENDOSCOPY;  Service: Endoscopy;  Laterality: N/A;  . DILATION AND CURETTAGE OF UTERUS    . ESOPHAGOGASTRODUODENOSCOPY (EGD) WITH PROPOFOL N/A 02/25/2018   Procedure: ESOPHAGOGASTRODUODENOSCOPY (EGD) WITH PROPOFOL;  Surgeon: Manya Silvas, MD;  Location: [facility] ENDOSCOPY;  Service: Endoscopy;  Laterality: N/A;  . EYE SURGERY     bilateral catarACT  . JOINT REPLACEMENT Bilateral    knee replacement  . LAPAROSCOPIC RIGHT COLECTOMY Right 12/15/2017   Procedure: LAPAROSCOPIC RIGHT COLECTOMY;  Surgeon: Robert Bellow, MD;  Location: [facility] ORS;  Service: General;  Laterality: Right;  . REPLACEMENT TOTAL KNEE BILATERAL    . TOTAL SHOULDER ARTHROPLASTY Right 09/24/2018   Procedure: TOTAL SHOULDER ARTHROPLASTY - REVERSE RIGHT;  Surgeon: Corky Mull, MD;  Location: [facility] ORS;  Service: Orthopedics;  Laterality: Right;    There were no vitals filed for this visit.   Subjective Assessment - 09/14/20 0720    Subjective Patient reports she hasn't been wearing her brace and has been more pain. Has not been compliant with HEP.    Pertinent History HPI    Limitations Reading    How long can you sit comfortably? Neck does not limit.    How long can you stand comfortably? Neck does not limit.    How long can you walk  comfortably? Can sometimes be sore, but not limited.    Currently in Pain? Yes    Pain Score 2     Pain Location Neck    Pain Orientation Right    Pain Descriptors / Indicators Aching    Pain Type Chronic pain    Pain Onset More than a month ago    Pain Frequency Constant               TherEx: Supine: -cervical AROM pain free with education on remaining in pain free arc of motion 8x 5 second hold cervical rotation each direction; more challenging to the R than left. education on pain free range of motion  -cervical AROM side bend 5x 5 second hold each side; max cueing  to remain in pain free ROM  -RUE AROM: flexion 10x, abduction 10x (requiring cues for sequencing), ER with focus on pectoral stretch (hold 8 seconds) -5lb bar chest press 12x  -scapular punches 10x      Supine <>sit with education on log roll and safe technique for reduced torsion on cervical spine.    Seated: Scapular retraction and depression with upright posture 5x10 second holds Shoulder Y's 10x  Seated posture: educated and performed upright neutral alignment with scapular retraction and neutral cervical alignment 3x 30 seconds   STM to cervical paraspinals and upper trap with implementation of effleurage and ptrissage x 14 minutes   Pt educated throughout session about proper posture and technique with exercises. Improved exercise technique, movement at target joints, use of target muscles after min to mod verbal, visual, tactile cues.   Patient continues to require education on compliance with HEP, wearing of cervical brace, and safety precautions. She did not wear brace today limiting stability interventions. Patient is taking driving test Tuesday. Prognosis is guarded due to limited carryover and safety awareness. Patient would benefit from skilled physical therapy to improve quality of life, will continue with current POC at this time                     PT Education - 09/14/20 0723    Education Details exercise technique, body mechanics    Person(s) Educated Patient    Methods Explanation;Demonstration;Tactile cues;Verbal cues    Comprehension Verbalized understanding;Returned demonstration;Verbal cues required;Tactile cues required            PT Short Term Goals - 08/18/20 2025      PT SHORT TERM GOAL #1   Title After 4 weeks pt will reports less fatigue in her neck, report only requiring the cervical soft collar <4 days per week.    Baseline Feels need for collar most afternoons due to fatigue    Time 4    Period Weeks    Status New    Target Date  09/14/20      PT SHORT TERM GOAL #2   Title After 4 weeks pt to demonstrate improved 5xSTS <12 seconds and without LOB.    Baseline 13.67sec c LOB on 2nd rep of 6    Time 4    Period Weeks    Status New    Target Date 09/14/20      PT SHORT TERM GOAL #3   Title After 4 weeks pt to demonstrate improve lumbopelvic motor control AEB ability to ATTEMPT SLS multiple times without onset cramping in legs.    Baseline has severe cramping limitations in hamstrings during testing on eval    Time 4    Period Weeks  Status New    Target Date 09/14/20             PT Long Term Goals - 08/18/20 0931      PT LONG TERM GOAL #1   Title After 6 weeks pt to score >80 on FOTO survey to demonstrate improved participation in life.    Baseline 77 at eval    Time 6    Period Weeks    Status New    Target Date 09/28/20      PT LONG TERM GOAL #2   Title After 8 weeks pt to demonstrate 5xSTS in <12sec eyes closed without LOB to demonstrate improved dynamic motor control.    Baseline eyes open at eval    Time 8    Period Weeks    Status New    Target Date 10/12/20      PT LONG TERM GOAL #3   Title After 8 weeks pt to demonstrate improved balance AEB SLS >5sec bilat without use of arms for righting upon failure thereafter.    Baseline <3 sec SLS, no use of arms for righting, but arresting hamstrings cramps bilat    Time 8    Period Weeks    Status New    Target Date 10/13/20      PT LONG TERM GOAL #4   Title TBD long term balance goal    Baseline standardized balance tool deferred to future visit                 Plan - 09/14/20 0738    Clinical Impression Statement Patient continues to require education on compliance with HEP, wearing of cervical brace, and safety precautions. She did not wear brace today limiting stability interventions. Patient is taking driving test Tuesday. Prognosis is guarded due to limited carryover and safety awareness. Patient would benefit from skilled  physical therapy to improve quality of life, will continue with current POC at this time    Personal Factors and Comorbidities Age;Sex;Behavior Pattern;Comorbidity 1;Fitness;Time since onset of injury/illness/exacerbation    Comorbidities osteoporosis    Examination-Activity Limitations Bend;Bathing;Dressing;Lift;Reach Overhead;Stand;Stairs    Examination-Participation Restrictions Driving;Community Activity;Yard Work;Cleaning    Stability/Clinical Decision Making Unstable/Unpredictable    Rehab Potential Fair    PT Frequency 2x / week    PT Duration 12 weeks    PT Treatment/Interventions ADLs/Self Care Home Management;Cryotherapy;Electrical Stimulation;Moist Heat;DME Instruction;Gait training;Stair training;Functional mobility training;Therapeutic activities;Therapeutic exercise;Balance training;Neuromuscular re-education;Patient/family education;Passive range of motion    PT Next Visit Plan Discuss plan of PT and limitations of care, particualrly regarding return to driivng. Screen right shoulder moblity/strength for lifting activity at home, cervical extension/flexion A/ROM assessent (already permitted for [employer]); Exhaustive balance screening to address 5+ years of falls and orthopedic insult.    PT Home Exercise Plan None issued at eval; to be determine.    Consulted and Agree with Plan of Care Patient           Patient will benefit from skilled therapeutic intervention in order to improve the following deficits and impairments:  Decreased balance,Abnormal gait,Decreased activity tolerance,Decreased knowledge of use of DME,Decreased knowledge of precautions,Decreased mobility,Decreased range of motion,Decreased strength,Hypomobility,Increased muscle spasms,Postural dysfunction,Impaired UE functional use  Visit Diagnosis: Abnormal head movements  Repeated falls  Muscle weakness (generalized)     Problem List Patient Active Problem List   Diagnosis Date Noted  . Status post  reverse total shoulder replacement, right 09/24/2018  . S/P right colectomy 01/26/2018  . Adenomatous polyp of ascending colon 11/24/2017  .  Generalized anxiety disorder 09/06/2017  . Irritable bowel syndrome (IBS) 09/06/2017  . Fracture of one rib, right side, subsequent encounter for fracture with routine healing 03/08/2017  . Orthostasis 06/05/2016  . Left shoulder pain 10/13/2015  . Constipation 05/30/2015  . Osteoporosis, postmenopausal 05/30/2015  . Compression fracture of L2 lumbar vertebra with delayed healing 05/29/2015  . Frequent falls 03/26/2015  . Atherosclerosis of arteries 03/26/2015  . Chest pain 02/09/2015  . Breast cancer screening 10/01/2014  . Family history of colon cancer 09/28/2014  . Viral URI with cough 06/12/2014  . Tenesmus (rectal) 06/12/2014  . TMJ (temporomandibular joint syndrome) 06/12/2014  . Varicose veins of both lower extremities without ulcer or inflammation 06/12/2014  . B12 deficiency 09/20/2013  . Fatigue 05/23/2013  . Internal hemorrhoids without complication 04/20/2013  . Insomnia 03/03/2013  . Vertigo due to cerebrovascular disease 03/03/2013  . Seronegative rheumatoid arthritis affecting lower leg (HCC) 03/03/2013   Precious Bard, PT, DPT   09/14/2020, 8:04 AM  [place] [facility] MAIN [facility] SERVICES [address] [place], [place], [address] Phone: [phone]   Fax:  [phone]  Name: Teresa Lin MRN: [id number] Date of Birth: [birth year]

## 2020-09-18 ENCOUNTER — Ambulatory Visit: Payer: Medicare Other

## 2020-09-19 ENCOUNTER — Encounter: Payer: Medicare Other | Admitting: Speech Pathology

## 2020-09-19 ENCOUNTER — Ambulatory Visit: Payer: Medicare Other

## 2020-09-19 ENCOUNTER — Other Ambulatory Visit: Payer: Self-pay

## 2020-09-19 DIAGNOSIS — R250 Abnormal head movements: Secondary | ICD-10-CM

## 2020-09-19 DIAGNOSIS — M6281 Muscle weakness (generalized): Secondary | ICD-10-CM

## 2020-09-19 DIAGNOSIS — R296 Repeated falls: Secondary | ICD-10-CM

## 2020-09-19 NOTE — Therapy (Signed)
[place] MAIN [facility] SERVICES [address] [place], [place], [address] Phone: [phone]   Fax:  [phone]  Physical Therapy Treatment  Patient Details  Name: Teresa Lin MRN: [id number] Date of Birth: 1931-12-18 Referring Provider (PT): Cade Shah,MD (Neurology)   Encounter Date: 09/19/2020   PT End of Session - 09/19/20 0716    Visit Number 9    Number of Visits 17    Date for PT Re-Evaluation 11/09/20    Authorization Type Medicare Parts A and B    Authorization Time Period 08/17/20-11/09/20    PT Start Time 0715    PT Stop Time 0093    PT Time Calculation (min) 44 min    Activity Tolerance Patient tolerated treatment well;No increased pain;Patient limited by fatigue    Behavior During Therapy [facility] for tasks assessed/performed           Past Medical History:  Diagnosis Date  . Anemia   . Anxiety   . Arthritis    osteoarthritis. Bilateral knee replacement, hands, meniscal tear, cervical disc disease  . Atherosclerosis   . Atypical chest pain   . Collagen vascular disease ([place])   . Colon polyp 12/15/2017  . Colon polyps   . Compression fracture of L2 ([place])   . Diverticulosis   . DVT (deep venous thrombosis) ([place])   . GERD (gastroesophageal reflux disease)   . History of seronegative inflammatory arthritis   . Hypertension   . IBS (irritable bowel syndrome)   . Internal hemorrhoids   . Phlebitis and thrombophlebitis of deep veins of lower extremities (HCC)    after surgery  . Vision abnormalities     Past Surgical History:  Procedure Laterality Date  . APPENDECTOMY    . COLONOSCOPY N/A 01/24/2020   Procedure: COLONOSCOPY;  Surgeon: Lesly Rubenstein, MD;  Location: [facility] ENDOSCOPY;  Service: Endoscopy;  Laterality: N/A;  . COLONOSCOPY WITH PROPOFOL N/A 10/24/2017   Procedure: COLONOSCOPY WITH PROPOFOL;  Surgeon: Manya Silvas, MD;  Location: [facility] ENDOSCOPY;  Service: Endoscopy;  Laterality: N/A;  . COLONOSCOPY WITH  PROPOFOL N/A 11/21/2017   Procedure: COLONOSCOPY WITH PROPOFOL;  Surgeon: Manya Silvas, MD;  Location: [facility] ENDOSCOPY;  Service: Endoscopy;  Laterality: N/A;  . DILATION AND CURETTAGE OF UTERUS    . ESOPHAGOGASTRODUODENOSCOPY (EGD) WITH PROPOFOL N/A 02/25/2018   Procedure: ESOPHAGOGASTRODUODENOSCOPY (EGD) WITH PROPOFOL;  Surgeon: Manya Silvas, MD;  Location: [facility] ENDOSCOPY;  Service: Endoscopy;  Laterality: N/A;  . EYE SURGERY     bilateral catarACT  . JOINT REPLACEMENT Bilateral    knee replacement  . LAPAROSCOPIC RIGHT COLECTOMY Right 12/15/2017   Procedure: LAPAROSCOPIC RIGHT COLECTOMY;  Surgeon: Robert Bellow, MD;  Location: [facility] ORS;  Service: General;  Laterality: Right;  . REPLACEMENT TOTAL KNEE BILATERAL    . TOTAL SHOULDER ARTHROPLASTY Right 09/24/2018   Procedure: TOTAL SHOULDER ARTHROPLASTY - REVERSE RIGHT;  Surgeon: Corky Mull, MD;  Location: [facility] ORS;  Service: Orthopedics;  Laterality: Right;    There were no vitals filed for this visit.   Subjective Assessment - 09/19/20 0717    Subjective Patient brought her brace today. No falls or LOB since last session. Has been occasionally compliant with HEP.    Pertinent History HPI    Limitations Reading    How long can you sit comfortably? Neck does not limit.    How long can you stand comfortably? Neck does not limit.    How long can you walk comfortably?  Can sometimes be sore, but not limited.    Currently in Pain? Yes    Pain Score 2     Pain Location Neck    Pain Orientation Right    Pain Descriptors / Indicators Aching    Pain Onset More than a month ago               TherEx: Supine: -cervical AROM pain free with education on remaining in pain free arc of motion 8x 5 second hold cervical rotation each direction; more challenging to the R than left. education on pain free range of motion  -cervical AROM side bend 5x 5 second hold each side; max cueing to remain in pain free ROM  -5lb bar chest press  12x  Supine <>sit with education on log roll and safe technique for reduced torsion on cervical spine.    Seated: Scapular retraction and depression with upright posture 5x10 second holds 5lb bar row with focus on scapular retraction and depression 10x with 4 second holds   STM to cervical paraspinals and upper trap with implementation of effleurage and ptrissage x 9 minutes   Standing: On airex pad:  -5lb bar: chest press 10x close CGA and cues for sequencing -straight arm raise 10x; very challenging with cues for sequencing and body mechanics  Wall posture 10x 5 second holds max cueing for placement of feet and shoulders.  Wall abduction flys 10x very challenging   Pt educated throughout session about proper posture and technique with exercises. Improved exercise technique, movement at target joints, use of target muscles after min to mod verbal, visual, tactile cues.   Patient requires re-education on home program again with limited ability to show proper body mechanics and sequencing. She is able to perform stability and postural interventions while wearing soft collar with adequate ankle righting reactions noted. Postural interventions limited by wrist pain. Prognosis is guarded due to limited carryover and safety awareness. Patient would benefit from skilled physical therapy to improve quality of life, will continue with current POC at this time      Access Code: [place]: [email or web address] Date: 09/19/2020 Prepared by: Janna Arch  Exercises Standing Neutral Spine at [place] - 1 x daily - 7 x weekly - 2 sets - 10 reps - 5 hold [place] - 1 x daily - 7 x weekly - 2 sets - 10 reps - 5 hold                      PT Education - 09/19/20 0716    Education Details exercise technique, body mechanics    Person(s) Educated Patient    Methods Explanation;Demonstration;Tactile cues;Verbal cues    Comprehension Verbalized  understanding;Returned demonstration;Verbal cues required;Tactile cues required            PT Short Term Goals - 08/18/20 2841      PT SHORT TERM GOAL #1   Title After 4 weeks pt will reports less fatigue in her neck, report only requiring the cervical soft collar <4 days per week.    Baseline Feels need for collar most afternoons due to fatigue    Time 4    Period Weeks    Status New    Target Date 09/14/20      PT SHORT TERM GOAL #2   Title After 4 weeks pt to demonstrate improved 5xSTS <12 seconds and without LOB.    Baseline 13.67sec c LOB on 2nd rep of 6    Time  4    Period Weeks    Status New    Target Date 09/14/20      PT SHORT TERM GOAL #3   Title After 4 weeks pt to demonstrate improve lumbopelvic motor control AEB ability to ATTEMPT SLS multiple times without onset cramping in legs.    Baseline has severe cramping limitations in hamstrings during testing on eval    Time 4    Period Weeks    Status New    Target Date 09/14/20             PT Long Term Goals - 08/18/20 0931      PT LONG TERM GOAL #1   Title After 6 weeks pt to score >80 on FOTO survey to demonstrate improved participation in life.    Baseline 77 at eval    Time 6    Period Weeks    Status New    Target Date 09/28/20      PT LONG TERM GOAL #2   Title After 8 weeks pt to demonstrate 5xSTS in <12sec eyes closed without LOB to demonstrate improved dynamic motor control.    Baseline eyes open at eval    Time 8    Period Weeks    Status New    Target Date 10/12/20      PT LONG TERM GOAL #3   Title After 8 weeks pt to demonstrate improved balance AEB SLS >5sec bilat without use of arms for righting upon failure thereafter.    Baseline <3 sec SLS, no use of arms for righting, but arresting hamstrings cramps bilat    Time 8    Period Weeks    Status New    Target Date 10/13/20      PT LONG TERM GOAL #4   Title TBD long term balance goal    Baseline standardized balance tool deferred to  future visit                 Plan - 09/19/20 0758    Clinical Impression Statement Patient requires re-education on home program again with limited ability to show proper body mechanics and sequencing. She is able to perform stability and postural interventions while wearing soft collar with adequate ankle righting reactions noted. Postural interventions limited by wrist pain. Prognosis is guarded due to limited carryover and safety awareness. Patient would benefit from skilled physical therapy to improve quality of life, will continue with current POC at this time    Personal Factors and Comorbidities Age;Sex;Behavior Pattern;Comorbidity 1;Fitness;Time since onset of injury/illness/exacerbation    Comorbidities osteoporosis    Examination-Activity Limitations Bend;Bathing;Dressing;Lift;Reach Overhead;Stand;Stairs    Examination-Participation Restrictions Driving;Community Activity;Yard Work;Cleaning    Stability/Clinical Decision Making Unstable/Unpredictable    Rehab Potential Fair    PT Frequency 2x / week    PT Duration 12 weeks    PT Treatment/Interventions ADLs/Self Care Home Management;Cryotherapy;Electrical Stimulation;Moist Heat;DME Instruction;Gait training;Stair training;Functional mobility training;Therapeutic activities;Therapeutic exercise;Balance training;Neuromuscular re-education;Patient/family education;Passive range of motion    PT Next Visit Plan Discuss plan of PT and limitations of care, particualrly regarding return to driivng. Screen right shoulder moblity/strength for lifting activity at home, cervical extension/flexion A/ROM assessent (already permitted for [employer]); Exhaustive balance screening to address 5+ years of falls and orthopedic insult.    PT Home Exercise Plan None issued at eval; to be determine.    Consulted and Agree with Plan of Care Patient           Patient will benefit from  skilled therapeutic intervention in order to improve the  following deficits and impairments:  Decreased balance,Abnormal gait,Decreased activity tolerance,Decreased knowledge of use of DME,Decreased knowledge of precautions,Decreased mobility,Decreased range of motion,Decreased strength,Hypomobility,Increased muscle spasms,Postural dysfunction,Impaired UE functional use  Visit Diagnosis: Abnormal head movements  Repeated falls  Muscle weakness (generalized)     Problem List Patient Active Problem List   Diagnosis Date Noted  . Status post reverse total shoulder replacement, right 09/24/2018  . S/P right colectomy 01/26/2018  . Adenomatous polyp of ascending colon 11/24/2017  . Generalized anxiety disorder 09/06/2017  . Irritable bowel syndrome (IBS) 09/06/2017  . Fracture of one rib, right side, subsequent encounter for fracture with routine healing 03/08/2017  . Orthostasis 06/05/2016  . Left shoulder pain 10/13/2015  . Constipation 05/30/2015  . Osteoporosis, postmenopausal 05/30/2015  . Compression fracture of L2 lumbar vertebra with delayed healing 05/29/2015  . Frequent falls 03/26/2015  . Atherosclerosis of arteries 03/26/2015  . Chest pain 02/09/2015  . Breast cancer screening 10/01/2014  . Family history of colon cancer 09/28/2014  . Viral URI with cough 06/12/2014  . Tenesmus (rectal) 06/12/2014  . TMJ (temporomandibular joint syndrome) 06/12/2014  . Varicose veins of both lower extremities without ulcer or inflammation 06/12/2014  . B12 deficiency 09/20/2013  . Fatigue 05/23/2013  . Internal hemorrhoids without complication 54/49/2010  . Insomnia 03/03/2013  . Vertigo due to cerebrovascular disease 03/03/2013  . Seronegative rheumatoid arthritis affecting lower leg ([place]) 03/03/2013   Janna Arch, PT, DPT   09/19/2020, 7:59 AM  [place] MAIN [facility] SERVICES [address] [place], [place], [address] Phone: [phone]   Fax:  [phone]  Name: Teresa Lin MRN:  [id number] Date of Birth: July 01, 1931

## 2020-09-21 ENCOUNTER — Other Ambulatory Visit: Payer: Self-pay

## 2020-09-21 ENCOUNTER — Encounter: Payer: Medicare Other | Admitting: Speech Pathology

## 2020-09-21 ENCOUNTER — Ambulatory Visit: Payer: Medicare Other

## 2020-09-21 DIAGNOSIS — R250 Abnormal head movements: Secondary | ICD-10-CM | POA: Diagnosis not present

## 2020-09-21 DIAGNOSIS — R296 Repeated falls: Secondary | ICD-10-CM

## 2020-09-21 DIAGNOSIS — M6281 Muscle weakness (generalized): Secondary | ICD-10-CM

## 2020-09-21 NOTE — Therapy (Signed)
[place] MAIN [facility] SERVICES [address] [place], [place], [address] Phone: [phone]   Fax:  [phone]  Physical Therapy Treatment Physical Therapy Progress Note   Dates of reporting period  08/17/20   to   09/21/20  Patient Details  Name: Teresa Lin MRN: [id number] Date of Birth: 27-May-1931 Referring Provider (PT): Marfa Shah,MD (Neurology)   Encounter Date: 09/21/2020   PT End of Session - 09/21/20 0724    Visit Number 10    Number of Visits 17    Date for PT Re-Evaluation 11/09/20    Authorization Type Medicare Parts A and B; next session 1/10 PN 09/21/20    Authorization Time Period 08/17/20-11/09/20    PT Start Time 0716    PT Stop Time 0758    PT Time Calculation (min) 42 min    Activity Tolerance Patient tolerated treatment well;No increased pain;Patient limited by fatigue    Behavior During Therapy [facility] for tasks assessed/performed           Past Medical History:  Diagnosis Date  . Anemia   . Anxiety   . Arthritis    osteoarthritis. Bilateral knee replacement, hands, meniscal tear, cervical disc disease  . Atherosclerosis   . Atypical chest pain   . Collagen vascular disease ([place])   . Colon polyp 12/15/2017  . Colon polyps   . Compression fracture of L2 ([place])   . Diverticulosis   . DVT (deep venous thrombosis) ([place])   . GERD (gastroesophageal reflux disease)   . History of seronegative inflammatory arthritis   . Hypertension   . IBS (irritable bowel syndrome)   . Internal hemorrhoids   . Phlebitis and thrombophlebitis of deep veins of lower extremities (HCC)    after surgery  . Vision abnormalities     Past Surgical History:  Procedure Laterality Date  . APPENDECTOMY    . COLONOSCOPY N/A 01/24/2020   Procedure: COLONOSCOPY;  Surgeon: Lesly Rubenstein, MD;  Location: [facility] ENDOSCOPY;  Service: Endoscopy;  Laterality: N/A;  . COLONOSCOPY WITH PROPOFOL N/A 10/24/2017   Procedure: COLONOSCOPY WITH PROPOFOL;   Surgeon: Manya Silvas, MD;  Location: [facility] ENDOSCOPY;  Service: Endoscopy;  Laterality: N/A;  . COLONOSCOPY WITH PROPOFOL N/A 11/21/2017   Procedure: COLONOSCOPY WITH PROPOFOL;  Surgeon: Manya Silvas, MD;  Location: [facility] ENDOSCOPY;  Service: Endoscopy;  Laterality: N/A;  . DILATION AND CURETTAGE OF UTERUS    . ESOPHAGOGASTRODUODENOSCOPY (EGD) WITH PROPOFOL N/A 02/25/2018   Procedure: ESOPHAGOGASTRODUODENOSCOPY (EGD) WITH PROPOFOL;  Surgeon: Manya Silvas, MD;  Location: [facility] ENDOSCOPY;  Service: Endoscopy;  Laterality: N/A;  . EYE SURGERY     bilateral catarACT  . JOINT REPLACEMENT Bilateral    knee replacement  . LAPAROSCOPIC RIGHT COLECTOMY Right 12/15/2017   Procedure: LAPAROSCOPIC RIGHT COLECTOMY;  Surgeon: Robert Bellow, MD;  Location: [facility] ORS;  Service: General;  Laterality: Right;  . REPLACEMENT TOTAL KNEE BILATERAL    . TOTAL SHOULDER ARTHROPLASTY Right 09/24/2018   Procedure: TOTAL SHOULDER ARTHROPLASTY - REVERSE RIGHT;  Surgeon: Corky Mull, MD;  Location: [facility] ORS;  Service: Orthopedics;  Laterality: Right;    There were no vitals filed for this visit.   Subjective Assessment - 09/21/20 0721    Subjective Patient took her driver's test and passed. She reports she has not been compliant with HEP.    Pertinent History HPI    Limitations Reading    How long can you sit comfortably? Neck does not limit.  How long can you stand comfortably? Neck does not limit.    How long can you walk comfortably? Can sometimes be sore, but not limited.    Currently in Pain? Yes    Pain Score 2     Pain Location Neck    Pain Orientation Right    Pain Descriptors / Indicators Aching    Pain Type Chronic pain    Pain Onset More than a month ago    Pain Frequency Constant               Patient took her driver's test and passed. She reports she has not been compliant with HEP.      Goals: Neck pain: 5-6/10   5x STS: 13.08  SLS FOTO: 69%    Treatment: Supine: -cervical AROM pain free with education on remaining in pain free arc of motion10x5second holdcervical rotation each direction; more challenging to the R than [email or web address] on pain free range of motion -cervical AROM side bend8x5second holdeach side; max cueing to remain in pain free ROM  -cervical extension/flexion 10x 3 second holds each movement.  -chin tuck 10x 3 second hold -bilateral UE "angel wings" abduction 10x  -5lb bar chest press 12x -scapular punches 10x    Supine <>sit with education on log roll and safe technique for reduced torsion on cervical spine.  Seated: Scapular retraction and depression with upright posture 5x10 second holds Shoulder Y's 10x Arnold into pectoral stretch 10x  RTB row 15x  Bilateral ER without resistance 15x   STM to cervical paraspinals and upper trap with implementation of effleurage and ptrissage x 14 minutes  Standing:  Wall posture: bringing shoulders and head to wall 10x 5 second holds Wall angles: abduction 10x very fatiguing  Pt educated throughout session about proper posture and technique with exercises. Improved exercise technique, movement at target joints, use of target muscles after min to mod verbal, visual, tactile cues.  Patient's condition has the potential to improve in response to therapy. Maximum improvement is yet to be obtained. The anticipated improvement is attainable and reasonable in a generally predictable time.  Patient reports she is 50-60% back to her normal.   Patient is making slow progress towards goals, her progress is hindered by limited HEP compliance as well as limited safety awareness of spinal precautions. She will be missing the next three appointments due to being out of town. Emphasized the importance of compliance with HEP in order to not regress.  Patient's condition has the potential to improve in response to therapy. Maximum improvement is yet to be obtained.  The anticipated improvement is attainable and reasonable in a generally predictable time. Patient would benefit from skilled physical therapy to improve quality of life, will continue with current POC at this time                 PT Education - 09/21/20 0721    Education Details goals    Person(s) Educated Patient    Methods Explanation;Demonstration;Tactile cues;Verbal cues    Comprehension Verbalized understanding;Returned demonstration;Verbal cues required;Tactile cues required            PT Short Term Goals - 09/21/20 0725      PT SHORT TERM GOAL #1   Title After 4 weeks pt will reports less fatigue in her neck, report only requiring the cervical soft collar <4 days per week.    Baseline Feels need for collar most afternoons due to fatigue 5/12: wears 2 days a week  Time 4    Period Weeks    Status Achieved    Target Date 09/14/20      PT SHORT TERM GOAL #2   Title After 4 weeks pt to demonstrate improved 5xSTS <12 seconds and without LOB.    Baseline 13.67sec c LOB on 2nd rep of 6 5/12: 13.08 no LOB    Time 4    Period Weeks    Status Partially Met    Target Date 09/14/20      PT SHORT TERM GOAL #3   Title After 4 weeks pt to demonstrate improve lumbopelvic motor control AEB ability to ATTEMPT SLS multiple times without onset cramping in legs.    Baseline has severe cramping limitations in hamstrings during testing on eval    Time 4    Period Weeks    Status Partially Met    Target Date 09/14/20             PT Long Term Goals - 09/21/20 0726      PT LONG TERM GOAL #1   Title After 6 weeks pt to score >80 on FOTO survey to demonstrate improved participation in life.    Baseline 77 at eval 5/12: 69%    Time 8    Period Weeks    Status On-going    Target Date 10/12/20      PT LONG TERM GOAL #2   Title After 8 weeks pt to demonstrate 5xSTS in <12sec eyes closed without LOB to demonstrate improved dynamic motor control.    Baseline eyes open at  eval 5/12: eyes open 13.08    Time 8    Period Weeks    Status Partially Met    Target Date 10/12/20      PT LONG TERM GOAL #3   Title After 8 weeks pt to demonstrate improved balance AEB SLS >5sec bilat without use of arms for righting upon failure thereafter.    Baseline <3 sec SLS, no use of arms for righting, but arresting hamstrings cramps bilat    Time 8    Period Weeks    Status Partially Met    Target Date 10/13/20                 Plan - 09/21/20 0734    Clinical Impression Statement Patient is making slow progress towards goals, her progress is hindered by limited HEP compliance as well as limited safety awareness of spinal precautions. She will be missing the next three appointments due to being out of town. Emphasized the importance of compliance with HEP in order to not regress.  Patient's condition has the potential to improve in response to therapy. Maximum improvement is yet to be obtained. The anticipated improvement is attainable and reasonable in a generally predictable time. Patient would benefit from skilled physical therapy to improve quality of life, will continue with current POC at this time    Personal Factors and Comorbidities Age;Sex;Behavior Pattern;Comorbidity 1;Fitness;Time since onset of injury/illness/exacerbation    Comorbidities osteoporosis    Examination-Activity Limitations Bend;Bathing;Dressing;Lift;Reach Overhead;Stand;Stairs    Examination-Participation Restrictions Driving;Community Activity;Yard Work;Cleaning    Stability/Clinical Decision Making Unstable/Unpredictable    Rehab Potential Fair    PT Frequency 2x / week    PT Duration 12 weeks    PT Treatment/Interventions ADLs/Self Care Home Management;Cryotherapy;Electrical Stimulation;Moist Heat;DME Instruction;Gait training;Stair training;Functional mobility training;Therapeutic activities;Therapeutic exercise;Balance training;Neuromuscular re-education;Patient/family education;Passive  range of motion    PT Next Visit Plan Discuss plan of PT and limitations of  care, particualrly regarding return to [place] right shoulder moblity/strength for lifting activity at home, cervical extension/flexion A/ROM assessent (already permitted for [employer]); Exhaustive balance screening to address 5+ years of falls and orthopedic insult.    PT Home Exercise Plan None issued at eval; to be determine.    Consulted and Agree with Plan of Care Patient           Patient will benefit from skilled therapeutic intervention in order to improve the following deficits and impairments:  Decreased balance,Abnormal gait,Decreased activity tolerance,Decreased knowledge of use of DME,Decreased knowledge of precautions,Decreased mobility,Decreased range of motion,Decreased strength,Hypomobility,Increased muscle spasms,Postural dysfunction,Impaired UE functional use  Visit Diagnosis: Abnormal head movements  Repeated falls  Muscle weakness (generalized)     Problem List Patient Active Problem List   Diagnosis Date Noted  . Status post reverse total shoulder replacement, right 09/24/2018  . S/P right colectomy 01/26/2018  . Adenomatous polyp of ascending colon 11/24/2017  . Generalized anxiety disorder 09/06/2017  . Irritable bowel syndrome (IBS) 09/06/2017  . Fracture of one rib, right side, subsequent encounter for fracture with routine healing 03/08/2017  . Orthostasis 06/05/2016  . Left shoulder pain 10/13/2015  . Constipation 05/30/2015  . Osteoporosis, postmenopausal 05/30/2015  . Compression fracture of L2 lumbar vertebra with delayed healing 05/29/2015  . Frequent falls 03/26/2015  . Atherosclerosis of arteries 03/26/2015  . Chest pain 02/09/2015  . Breast cancer screening 10/01/2014  . Family history of colon cancer 09/28/2014  . Viral URI with cough 06/12/2014  . Tenesmus (rectal) 06/12/2014  . TMJ (temporomandibular joint syndrome) 06/12/2014  . Varicose veins of  both lower extremities without ulcer or inflammation 06/12/2014  . B12 deficiency 09/20/2013  . Fatigue 05/23/2013  . Internal hemorrhoids without complication 40/99/2780  . Insomnia 03/03/2013  . Vertigo due to cerebrovascular disease 03/03/2013  . Seronegative rheumatoid arthritis affecting lower leg ([place]) 03/03/2013   Janna Arch, PT, DPT   09/21/2020, 8:01 AM  [place] MAIN [facility] SERVICES [address] [place], [place], [address] Phone: [phone]   Fax:  [phone]  Name: Teresa Lin MRN: [id number] Date of Birth: March 19, 1932

## 2020-09-25 ENCOUNTER — Ambulatory Visit: Payer: Medicare Other

## 2020-09-26 ENCOUNTER — Encounter: Payer: Medicare Other | Admitting: Speech Pathology

## 2020-09-28 ENCOUNTER — Ambulatory Visit: Payer: Medicare Other

## 2020-09-28 ENCOUNTER — Encounter: Payer: Medicare Other | Admitting: Speech Pathology

## 2020-10-02 ENCOUNTER — Ambulatory Visit: Payer: Medicare Other

## 2020-10-03 ENCOUNTER — Encounter: Payer: Medicare Other | Admitting: Speech Pathology

## 2020-10-05 ENCOUNTER — Other Ambulatory Visit: Payer: Self-pay

## 2020-10-05 ENCOUNTER — Encounter: Payer: Medicare Other | Admitting: Speech Pathology

## 2020-10-05 ENCOUNTER — Ambulatory Visit: Payer: Medicare Other

## 2020-10-05 DIAGNOSIS — R296 Repeated falls: Secondary | ICD-10-CM

## 2020-10-05 DIAGNOSIS — R250 Abnormal head movements: Secondary | ICD-10-CM | POA: Diagnosis not present

## 2020-10-05 DIAGNOSIS — M6281 Muscle weakness (generalized): Secondary | ICD-10-CM

## 2020-10-05 NOTE — Therapy (Signed)
[place] MAIN [facility] SERVICES [address] [place], [place], [address] Phone: [phone]   Fax:  [phone]  Physical Therapy Treatment  Patient Details  Name: Teresa Lin MRN: [id number] Date of Birth: [birth year] Referring Provider (PT): Superior Shah,MD (Neurology)   Encounter Date: 10/05/2020   PT End of Session - 10/05/20 0720    Visit Number 11    Number of Visits 17    Date for PT Re-Evaluation 11/09/20    Authorization Type Medicare Parts A and B;  1/10 PN 09/21/20    Authorization Time Period 08/17/20-11/09/20    PT Start Time 0710    PT Stop Time 0750    PT Time Calculation (min) 40 min    Activity Tolerance Patient tolerated treatment well;No increased pain;Patient limited by fatigue    Behavior During Therapy [facility] for tasks assessed/performed           Past Medical History:  Diagnosis Date  . Anemia   . Anxiety   . Arthritis    osteoarthritis. Bilateral knee replacement, hands, meniscal tear, cervical disc disease  . Atherosclerosis   . Atypical chest pain   . Collagen vascular disease ([place])   . Colon polyp 12/15/2017  . Colon polyps   . Compression fracture of L2 ([place])   . Diverticulosis   . DVT (deep venous thrombosis) ([place])   . GERD (gastroesophageal reflux disease)   . History of seronegative inflammatory arthritis   . Hypertension   . IBS (irritable bowel syndrome)   . Internal hemorrhoids   . Phlebitis and thrombophlebitis of deep veins of lower extremities (HCC)    after surgery  . Vision abnormalities     Past Surgical History:  Procedure Laterality Date  . APPENDECTOMY    . COLONOSCOPY N/A 01/24/2020   Procedure: COLONOSCOPY;  Surgeon: Lesly Rubenstein, MD;  Location: [facility] ENDOSCOPY;  Service: Endoscopy;  Laterality: N/A;  . COLONOSCOPY WITH PROPOFOL N/A 10/24/2017   Procedure: COLONOSCOPY WITH PROPOFOL;  Surgeon: Manya Silvas, MD;  Location: [facility] ENDOSCOPY;  Service: Endoscopy;  Laterality: N/A;  .  COLONOSCOPY WITH PROPOFOL N/A 11/21/2017   Procedure: COLONOSCOPY WITH PROPOFOL;  Surgeon: Manya Silvas, MD;  Location: [facility] ENDOSCOPY;  Service: Endoscopy;  Laterality: N/A;  . DILATION AND CURETTAGE OF UTERUS    . ESOPHAGOGASTRODUODENOSCOPY (EGD) WITH PROPOFOL N/A 02/25/2018   Procedure: ESOPHAGOGASTRODUODENOSCOPY (EGD) WITH PROPOFOL;  Surgeon: Manya Silvas, MD;  Location: [facility] ENDOSCOPY;  Service: Endoscopy;  Laterality: N/A;  . EYE SURGERY     bilateral catarACT  . JOINT REPLACEMENT Bilateral    knee replacement  . LAPAROSCOPIC RIGHT COLECTOMY Right 12/15/2017   Procedure: LAPAROSCOPIC RIGHT COLECTOMY;  Surgeon: Robert Bellow, MD;  Location: [facility] ORS;  Service: General;  Laterality: Right;  . REPLACEMENT TOTAL KNEE BILATERAL    . TOTAL SHOULDER ARTHROPLASTY Right 09/24/2018   Procedure: TOTAL SHOULDER ARTHROPLASTY - REVERSE RIGHT;  Surgeon: Corky Mull, MD;  Location: [facility] ORS;  Service: Orthopedics;  Laterality: Right;    There were no vitals filed for this visit.   Subjective Assessment - 10/05/20 2841    Subjective Patient reports she got back from her trip monday. Her flight cancelled so she had to drive back on Monday night  and is sore. was not compliant with HEP.    Pertinent History HPI    Limitations Reading    How long can you sit comfortably? Neck does not limit.    How long can  you stand comfortably? Neck does not limit.    How long can you walk comfortably? Can sometimes be sore, but not limited.    Currently in Pain? Yes    Pain Score 2     Pain Location Neck    Pain Orientation Right    Pain Descriptors / Indicators Aching    Pain Type Chronic pain    Pain Onset More than a month ago    Pain Frequency Constant                 Patient returning to therapy after vacation.     TherEx: Supine: -cervical AROM pain free with education on remaining in pain free arc of motion 8x 5 second hold cervical rotation each direction; more challenging to  the R than left. education on pain free range of motion ; significant lack of motion to the right compared to previous sessions  -cervical AROM side bend 5x 5 second hold each side; max cueing to remain in pain free ROM  -cervical extension flexion 10x with tactile cueing provided -chin tuck 10x with max cueing for sequencing 10x  -PVC pipe overhead reach 15x hold for 3-5 seconds -snow angels 10x  Supine <>sit with education on log roll and safe technique for reduced torsion on cervical spine.    Seated: Scapular retraction and depression with upright posture 5x10 second holds  PVC pipe row 15x STM to cervical paraspinals and upper trap with implementation of effleurage and ptrissage x 9 minutes   Standing: Wall posture 10x 5 second holds max cueing for placement of feet and shoulders.  Wall abduction flys 10x very challenging  Overhead PVC pipe raise with scapular retraction; very challenging for patient to preform 10x    Pt educated throughout session about proper posture and technique with exercises. Improved exercise technique, movement at target joints, use of target muscles after min to mod verbal, visual, tactile cues.     Patient has lost some of her functional range of motion since last session as can be seen in incorrect performance and limited compliance with HEP. Education on proper head movements for optimal lengthening of muscle tissue and strengthening of postural muscles performed. Prognosis is guarded due to limited carryover and safety awareness. Patient would benefit from skilled physical therapy to improve quality of life, will continue with current POC at this time                     PT Education - 10/05/20 0719    Education Details exercise technique, body mechanics    Person(s) Educated Patient    Methods Explanation;Demonstration;Tactile cues;Verbal cues    Comprehension Verbalized understanding;Returned demonstration;Verbal cues  required;Tactile cues required            PT Short Term Goals - 09/21/20 0725      PT SHORT TERM GOAL #1   Title After 4 weeks pt will reports less fatigue in her neck, report only requiring the cervical soft collar <4 days per week.    Baseline Feels need for collar most afternoons due to fatigue 5/12: wears 2 days a week    Time 4    Period Weeks    Status Achieved    Target Date 09/14/20      PT SHORT TERM GOAL #2   Title After 4 weeks pt to demonstrate improved 5xSTS <12 seconds and without LOB.    Baseline 13.67sec c LOB on 2nd rep of 6 5/12: 13.08 no  LOB    Time 4    Period Weeks    Status Partially Met    Target Date 09/14/20      PT SHORT TERM GOAL #3   Title After 4 weeks pt to demonstrate improve lumbopelvic motor control AEB ability to ATTEMPT SLS multiple times without onset cramping in legs.    Baseline has severe cramping limitations in hamstrings during testing on eval    Time 4    Period Weeks    Status Partially Met    Target Date 09/14/20             PT Long Term Goals - 09/21/20 0726      PT LONG TERM GOAL #1   Title After 6 weeks pt to score >80 on FOTO survey to demonstrate improved participation in life.    Baseline 77 at eval 5/12: 69%    Time 8    Period Weeks    Status On-going    Target Date 10/12/20      PT LONG TERM GOAL #2   Title After 8 weeks pt to demonstrate 5xSTS in <12sec eyes closed without LOB to demonstrate improved dynamic motor control.    Baseline eyes open at eval 5/12: eyes open 13.08    Time 8    Period Weeks    Status Partially Met    Target Date 10/12/20      PT LONG TERM GOAL #3   Title After 8 weeks pt to demonstrate improved balance AEB SLS >5sec bilat without use of arms for righting upon failure thereafter.    Baseline <3 sec SLS, no use of arms for righting, but arresting hamstrings cramps bilat    Time 8    Period Weeks    Status Partially Met    Target Date 10/13/20                 Plan -  10/05/20 0737    Clinical Impression Statement Patient has lost some of her functional range of motion since last session as can be seen in incorrect performance and limited compliance with HEP. Education on proper head movements for optimal lengthening of muscle tissue and strengthening of postural muscles performed. Prognosis is guarded due to limited carryover and safety awareness. Patient would benefit from skilled physical therapy to improve quality of life, will continue with current POC at this time    Personal Factors and Comorbidities Age;Sex;Behavior Pattern;Comorbidity 1;Fitness;Time since onset of injury/illness/exacerbation    Comorbidities osteoporosis    Examination-Activity Limitations Bend;Bathing;Dressing;Lift;Reach Overhead;Stand;Stairs    Examination-Participation Restrictions Driving;Community Activity;Yard Work;Cleaning    Stability/Clinical Decision Making Unstable/Unpredictable    Rehab Potential Fair    PT Frequency 2x / week    PT Duration 12 weeks    PT Treatment/Interventions ADLs/Self Care Home Management;Cryotherapy;Electrical Stimulation;Moist Heat;DME Instruction;Gait training;Stair training;Functional mobility training;Therapeutic activities;Therapeutic exercise;Balance training;Neuromuscular re-education;Patient/family education;Passive range of motion    PT Next Visit Plan Discuss plan of PT and limitations of care, particualrly regarding return to [place]iv[place]. Screen right shoulder moblity/strength for lifting activity at home, cervical extension/flexion A/ROM assessent (already permitted for [employer]); Exhaustive balance screening to address 5+ years of falls and orthopedic insult.    PT Home Exercise Plan None issued at eval; to be determine.    Consulted and Agree with Plan of Care Patient           Patient will benefit from skilled therapeutic intervention in order to improve the following deficits and impairments:  Decreased  balance,Abnormal gait,Decreased  activity tolerance,Decreased knowledge of use of DME,Decreased knowledge of precautions,Decreased mobility,Decreased range of motion,Decreased strength,Hypomobility,Increased muscle spasms,Postural dysfunction,Impaired UE functional use  Visit Diagnosis: Abnormal head movements  Repeated falls  Muscle weakness (generalized)     Problem List Patient Active Problem List   Diagnosis Date Noted  . Status post reverse total shoulder replacement, right 09/24/2018  . S/P right colectomy 01/26/2018  . Adenomatous polyp of ascending colon 11/24/2017  . Generalized anxiety disorder 09/06/2017  . Irritable bowel syndrome (IBS) 09/06/2017  . Fracture of one rib, right side, subsequent encounter for fracture with routine healing 03/08/2017  . Orthostasis 06/05/2016  . Left shoulder pain 10/13/2015  . Constipation 05/30/2015  . Osteoporosis, postmenopausal 05/30/2015  . Compression fracture of L2 lumbar vertebra with delayed healing 05/29/2015  . Frequent falls 03/26/2015  . Atherosclerosis of arteries 03/26/2015  . Chest pain 02/09/2015  . Breast cancer screening 10/01/2014  . Family history of colon cancer 09/28/2014  . Viral URI with cough 06/12/2014  . Tenesmus (rectal) 06/12/2014  . TMJ (temporomandibular joint syndrome) 06/12/2014  . Varicose veins of both lower extremities without ulcer or inflammation 06/12/2014  . B12 deficiency 09/20/2013  . Fatigue 05/23/2013  . Internal hemorrhoids without complication 50/93/2671  . Insomnia 03/03/2013  . Vertigo due to cerebrovascular disease 03/03/2013  . Seronegative rheumatoid arthritis affecting lower leg ([place]) 03/03/2013   Janna Arch, PT, DPT   10/05/2020, 7:55 AM  [place] MAIN [facility] SERVICES [address] [place], [place], [address] Phone: [phone]   Fax:  [phone]  Name: Teresa Lin MRN: [id number] Date of Birth: [birth year]

## 2020-10-10 ENCOUNTER — Other Ambulatory Visit: Payer: Self-pay

## 2020-10-10 ENCOUNTER — Ambulatory Visit: Payer: Medicare Other

## 2020-10-10 DIAGNOSIS — M6281 Muscle weakness (generalized): Secondary | ICD-10-CM

## 2020-10-10 DIAGNOSIS — R250 Abnormal head movements: Secondary | ICD-10-CM | POA: Diagnosis not present

## 2020-10-10 DIAGNOSIS — R296 Repeated falls: Secondary | ICD-10-CM

## 2020-10-10 NOTE — Therapy (Signed)
[place] MAIN [facility] SERVICES [address] [place], [place], [address] Phone: [phone]   Fax:  [phone]  Physical Therapy Treatment  Patient Details  Name: Teresa Lin MRN: [id number] Date of Birth: 1931-09-24 Referring Provider (PT): Meridian Shah,MD (Neurology)   Encounter Date: 10/10/2020   PT End of Session - 10/10/20 0724    Visit Number 12    Number of Visits 17    Date for PT Re-Evaluation 11/09/20    Authorization Type Medicare Parts A and B;  2/10 PN 09/21/20    Authorization Time Period 08/17/20-11/09/20    PT Start Time 0716    PT Stop Time 0756    PT Time Calculation (min) 40 min    Activity Tolerance Patient tolerated treatment well;No increased pain;Patient limited by fatigue    Behavior During Therapy [facility] for tasks assessed/performed           Past Medical History:  Diagnosis Date  . Anemia   . Anxiety   . Arthritis    osteoarthritis. Bilateral knee replacement, hands, meniscal tear, cervical disc disease  . Atherosclerosis   . Atypical chest pain   . Collagen vascular disease ([place])   . Colon polyp 12/15/2017  . Colon polyps   . Compression fracture of L2 ([place])   . Diverticulosis   . DVT (deep venous thrombosis) ([place])   . GERD (gastroesophageal reflux disease)   . History of seronegative inflammatory arthritis   . Hypertension   . IBS (irritable bowel syndrome)   . Internal hemorrhoids   . Phlebitis and thrombophlebitis of deep veins of lower extremities (HCC)    after surgery  . Vision abnormalities     Past Surgical History:  Procedure Laterality Date  . APPENDECTOMY    . COLONOSCOPY N/A 01/24/2020   Procedure: COLONOSCOPY;  Surgeon: Lesly Rubenstein, MD;  Location: [facility] ENDOSCOPY;  Service: Endoscopy;  Laterality: N/A;  . COLONOSCOPY WITH PROPOFOL N/A 10/24/2017   Procedure: COLONOSCOPY WITH PROPOFOL;  Surgeon: Manya Silvas, MD;  Location: [facility] ENDOSCOPY;  Service: Endoscopy;  Laterality: N/A;  .  COLONOSCOPY WITH PROPOFOL N/A 11/21/2017   Procedure: COLONOSCOPY WITH PROPOFOL;  Surgeon: Manya Silvas, MD;  Location: [facility] ENDOSCOPY;  Service: Endoscopy;  Laterality: N/A;  . DILATION AND CURETTAGE OF UTERUS    . ESOPHAGOGASTRODUODENOSCOPY (EGD) WITH PROPOFOL N/A 02/25/2018   Procedure: ESOPHAGOGASTRODUODENOSCOPY (EGD) WITH PROPOFOL;  Surgeon: Manya Silvas, MD;  Location: [facility] ENDOSCOPY;  Service: Endoscopy;  Laterality: N/A;  . EYE SURGERY     bilateral catarACT  . JOINT REPLACEMENT Bilateral    knee replacement  . LAPAROSCOPIC RIGHT COLECTOMY Right 12/15/2017   Procedure: LAPAROSCOPIC RIGHT COLECTOMY;  Surgeon: Robert Bellow, MD;  Location: [facility] ORS;  Service: General;  Laterality: Right;  . REPLACEMENT TOTAL KNEE BILATERAL    . TOTAL SHOULDER ARTHROPLASTY Right 09/24/2018   Procedure: TOTAL SHOULDER ARTHROPLASTY - REVERSE RIGHT;  Surgeon: Corky Mull, MD;  Location: [facility] ORS;  Service: Orthopedics;  Laterality: Right;    There were no vitals filed for this visit.   Subjective Assessment - 10/10/20 4332    Subjective Patient reports she is having neck pain in the right side of her neck, not sure if she is doing her HEP correctly.Two days ago had ~8/10 pain, is better now but just feels stiff.    Pertinent History HPI    Limitations Reading    How long can you sit comfortably? Neck does not limit.  How long can you stand comfortably? Neck does not limit.    How long can you walk comfortably? Can sometimes be sore, but not limited.    Currently in Pain? Yes    Pain Score 1     Pain Location Neck    Pain Orientation Right    Pain Descriptors / Indicators Aching    Pain Type Chronic pain    Pain Onset More than a month ago    Pain Frequency Constant             Patient reports she is having neck pain in the right side of her neck, not sure if she is doing her HEP correctly. Two days ago had ~8/10 pain, is better now but just feels stiff.       TherEx: Supine: -cervical AROM pain free with education on remaining in pain free arc of motion 8x 5 second hold cervical rotation each direction; more challenging to the R than left. education on pain free range of motion ; significant lack of motion to the right compared to previous sessions ; cues for slowing down.  -cervical AROM side bend 10x 5 second hold each side; max cueing to remain in pain free ROM  -cervical extension flexion 10x with tactile cueing provided -chin tuck 10x with max cueing for sequencing 10x  -5lb bar overhead reach 15x hold for 3-5 seconds -snow angels 10x   Supine <>sit with education on log roll and safe technique for reduced torsion on cervical spine.    Seated: Cervical rotation slow controlled movements 10x each side Scapular retraction and depression with upright posture 5x10 second holds  5lb  Bar row 15x STM to cervical paraspinals and upper trap with implementation of effleurage and ptrissage x 9 minutes   Standing: Wall posture 10x 5 second holds max cueing for placement of feet and shoulders.  Wall abduction flys 10x very challenging     Pt educated throughout session about proper posture and technique with exercises. Improved exercise technique, movement at target joints, use of target muscles after min to mod verbal, visual, tactile cues.     Patient requires continued education on performance of HEP despite having the same program since evaluation indicating poor compliance at home and limited prognosis due to cognition and compliance. However patient did show improved task performance with guidance to previous session.  Patient would benefit from skilled physical therapy to improve quality of life, will continue with current POC at this time                         PT Education - 10/10/20 0723    Education Details slowing down and holding HEP    Person(s) Educated Patient    Methods Explanation;Demonstration;Tactile  cues;Verbal cues    Comprehension Verbalized understanding;Returned demonstration;Verbal cues required;Tactile cues required            PT Short Term Goals - 09/21/20 0725      PT SHORT TERM GOAL #1   Title After 4 weeks pt will reports less fatigue in her neck, report only requiring the cervical soft collar <4 days per week.    Baseline Feels need for collar most afternoons due to fatigue 5/12: wears 2 days a week    Time 4    Period Weeks    Status Achieved    Target Date 09/14/20      PT SHORT TERM GOAL #2   Title After 4 weeks  pt to demonstrate improved 5xSTS <12 seconds and without LOB.    Baseline 13.67sec c LOB on 2nd rep of 6 5/12: 13.08 no LOB    Time 4    Period Weeks    Status Partially Met    Target Date 09/14/20      PT SHORT TERM GOAL #3   Title After 4 weeks pt to demonstrate improve lumbopelvic motor control AEB ability to ATTEMPT SLS multiple times without onset cramping in legs.    Baseline has severe cramping limitations in hamstrings during testing on eval    Time 4    Period Weeks    Status Partially Met    Target Date 09/14/20             PT Long Term Goals - 09/21/20 0726      PT LONG TERM GOAL #1   Title After 6 weeks pt to score >80 on FOTO survey to demonstrate improved participation in life.    Baseline 77 at eval 5/12: 69%    Time 8    Period Weeks    Status On-going    Target Date 10/12/20      PT LONG TERM GOAL #2   Title After 8 weeks pt to demonstrate 5xSTS in <12sec eyes closed without LOB to demonstrate improved dynamic motor control.    Baseline eyes open at eval 5/12: eyes open 13.08    Time 8    Period Weeks    Status Partially Met    Target Date 10/12/20      PT LONG TERM GOAL #3   Title After 8 weeks pt to demonstrate improved balance AEB SLS >5sec bilat without use of arms for righting upon failure thereafter.    Baseline <3 sec SLS, no use of arms for righting, but arresting hamstrings cramps bilat    Time 8     Period Weeks    Status Partially Met    Target Date 10/13/20                 Plan - 10/10/20 0735    Clinical Impression Statement Patient requires continued education on performance of HEP despite having the same program since evaluation indicating poor compliance at home and limited prognosis due to cognition and compliance. However patient did show improved task performance with guidance to previous session.  Patient would benefit from skilled physical therapy to improve quality of life, will continue with current POC at this time    Personal Factors and Comorbidities Age;Sex;Behavior Pattern;Comorbidity 1;Fitness;Time since onset of injury/illness/exacerbation    Comorbidities osteoporosis    Examination-Activity Limitations Bend;Bathing;Dressing;Lift;Reach Overhead;Stand;Stairs    Examination-Participation Restrictions Driving;Community Activity;Yard Work;Cleaning    Stability/Clinical Decision Making Unstable/Unpredictable    Rehab Potential Fair    PT Frequency 2x / week    PT Duration 12 weeks    PT Treatment/Interventions ADLs/Self Care Home Management;Cryotherapy;Electrical Stimulation;Moist Heat;DME Instruction;Gait training;Stair training;Functional mobility training;Therapeutic activities;Therapeutic exercise;Balance training;Neuromuscular re-education;Patient/family education;Passive range of motion    PT Next Visit Plan Discuss plan of PT and limitations of care, particualrly regarding return to driivng. Screen right shoulder moblity/strength for lifting activity at home, cervical extension/flexion A/ROM assessent (already permitted for [employer]); Exhaustive balance screening to address 5+ years of falls and orthopedic insult.    PT Home Exercise Plan None issued at eval; to be determine.    Consulted and Agree with Plan of Care Patient           Patient will benefit from  skilled therapeutic intervention in order to improve the following deficits and impairments:   Decreased balance,Abnormal gait,Decreased activity tolerance,Decreased knowledge of use of DME,Decreased knowledge of precautions,Decreased mobility,Decreased range of motion,Decreased strength,Hypomobility,Increased muscle spasms,Postural dysfunction,Impaired UE functional use  Visit Diagnosis: Abnormal head movements  Repeated falls  Muscle weakness (generalized)     Problem List Patient Active Problem List   Diagnosis Date Noted  . Status post reverse total shoulder replacement, right 09/24/2018  . S/P right colectomy 01/26/2018  . Adenomatous polyp of ascending colon 11/24/2017  . Generalized anxiety disorder 09/06/2017  . Irritable bowel syndrome (IBS) 09/06/2017  . Fracture of one rib, right side, subsequent encounter for fracture with routine healing 03/08/2017  . Orthostasis 06/05/2016  . Left shoulder pain 10/13/2015  . Constipation 05/30/2015  . Osteoporosis, postmenopausal 05/30/2015  . Compression fracture of L2 lumbar vertebra with delayed healing 05/29/2015  . Frequent falls 03/26/2015  . Atherosclerosis of arteries 03/26/2015  . Chest pain 02/09/2015  . Breast cancer screening 10/01/2014  . Family history of colon cancer 09/28/2014  . Viral URI with cough 06/12/2014  . Tenesmus (rectal) 06/12/2014  . TMJ (temporomandibular joint syndrome) 06/12/2014  . Varicose veins of both lower extremities without ulcer or inflammation 06/12/2014  . B12 deficiency 09/20/2013  . Fatigue 05/23/2013  . Internal hemorrhoids without complication 09/11/5613  . Insomnia 03/03/2013  . Vertigo due to cerebrovascular disease 03/03/2013  . Seronegative rheumatoid arthritis affecting lower leg ([place]) 03/03/2013   Janna Arch, PT, DPT   10/10/2020, 7:57 AM  [place] MAIN [facility] SERVICES [address] [place], [place], [address] Phone: [phone]   Fax:  [phone]  Name: ASUNA PETH MRN: [id number] Date of Birth: [birth year]

## 2020-10-12 ENCOUNTER — Encounter: Payer: Medicare Other | Admitting: Speech Pathology

## 2020-10-12 ENCOUNTER — Other Ambulatory Visit: Payer: Self-pay

## 2020-10-12 ENCOUNTER — Ambulatory Visit: Payer: Medicare Other

## 2020-10-12 ENCOUNTER — Ambulatory Visit: Payer: Medicare Other | Attending: Internal Medicine

## 2020-10-12 DIAGNOSIS — M6281 Muscle weakness (generalized): Secondary | ICD-10-CM | POA: Diagnosis present

## 2020-10-12 DIAGNOSIS — R250 Abnormal head movements: Secondary | ICD-10-CM | POA: Diagnosis present

## 2020-10-12 DIAGNOSIS — R296 Repeated falls: Secondary | ICD-10-CM

## 2020-10-12 NOTE — Therapy (Signed)
[place] MAIN [facility] SERVICES [address] [place], [place], [address] Phone: [phone]   Fax:  [phone]  Physical Therapy Treatment  Patient Details  Name: Teresa Lin MRN: [id number] Date of Birth: 1932/04/11 Referring Provider (PT): [place] Shah,MD (Neurology)   Encounter Date: 10/12/2020   PT End of Session - 10/12/20 0723    Visit Number 13    Number of Visits 17    Date for PT Re-Evaluation 11/09/20    Authorization Type Medicare Parts A and B;  3/10 PN 09/21/20    Authorization Time Period 08/17/20-11/09/20    PT Start Time 0714    PT Stop Time 0756    PT Time Calculation (min) 42 min    Activity Tolerance Patient tolerated treatment well;No increased pain;Patient limited by fatigue    Behavior During Therapy [facility] for tasks assessed/performed           Past Medical History:  Diagnosis Date  . Anemia   . Anxiety   . Arthritis    osteoarthritis. Bilateral knee replacement, hands, meniscal tear, cervical disc disease  . Atherosclerosis   . Atypical chest pain   . Collagen vascular disease ([place])   . Colon polyp 12/15/2017  . Colon polyps   . Compression fracture of L2 ([place])   . Diverticulosis   . DVT (deep venous thrombosis) ([place])   . GERD (gastroesophageal reflux disease)   . History of seronegative inflammatory arthritis   . Hypertension   . IBS (irritable bowel syndrome)   . Internal hemorrhoids   . Phlebitis and thrombophlebitis of deep veins of lower extremities (HCC)    after surgery  . Vision abnormalities     Past Surgical History:  Procedure Laterality Date  . APPENDECTOMY    . COLONOSCOPY N/A 01/24/2020   Procedure: COLONOSCOPY;  Surgeon: Lesly Rubenstein, MD;  Location: [facility] ENDOSCOPY;  Service: Endoscopy;  Laterality: N/A;  . COLONOSCOPY WITH PROPOFOL N/A 10/24/2017   Procedure: COLONOSCOPY WITH PROPOFOL;  Surgeon: Manya Silvas, MD;  Location: [facility] ENDOSCOPY;  Service: Endoscopy;  Laterality: N/A;  .  COLONOSCOPY WITH PROPOFOL N/A 11/21/2017   Procedure: COLONOSCOPY WITH PROPOFOL;  Surgeon: Manya Silvas, MD;  Location: [facility] ENDOSCOPY;  Service: Endoscopy;  Laterality: N/A;  . DILATION AND CURETTAGE OF UTERUS    . ESOPHAGOGASTRODUODENOSCOPY (EGD) WITH PROPOFOL N/A 02/25/2018   Procedure: ESOPHAGOGASTRODUODENOSCOPY (EGD) WITH PROPOFOL;  Surgeon: Manya Silvas, MD;  Location: [facility] ENDOSCOPY;  Service: Endoscopy;  Laterality: N/A;  . EYE SURGERY     bilateral catarACT  . JOINT REPLACEMENT Bilateral    knee replacement  . LAPAROSCOPIC RIGHT COLECTOMY Right 12/15/2017   Procedure: LAPAROSCOPIC RIGHT COLECTOMY;  Surgeon: Robert Bellow, MD;  Location: [facility] ORS;  Service: General;  Laterality: Right;  . REPLACEMENT TOTAL KNEE BILATERAL    . TOTAL SHOULDER ARTHROPLASTY Right 09/24/2018   Procedure: TOTAL SHOULDER ARTHROPLASTY - REVERSE RIGHT;  Surgeon: Corky Mull, MD;  Location: [facility] ORS;  Service: Orthopedics;  Laterality: Right;    There were no vitals filed for this visit.   Subjective Assessment - 10/12/20 0716    Subjective Patient reports she doesn't think she is doing her HEP right, her neck is painful after performing her HEP despite being educated on HEP every session. Did not do her UE/posture HEP    Pertinent History HPI    Limitations Reading    How long can you sit comfortably? Neck does not limit.    How  long can you stand comfortably? Neck does not limit.    How long can you walk comfortably? Can sometimes be sore, but not limited.    Currently in Pain? Yes    Pain Score 2     Pain Location Neck    Pain Orientation Right    Pain Descriptors / Indicators Aching    Pain Type Chronic pain    Pain Onset More than a month ago    Pain Frequency Constant                  TherEx: Supine: -cervical AROM pain free with education on remaining in pain free arc of motion 10x 5 second hold cervical rotation each direction; more challenging to the R than left.  education on pain free range of motion ; max cueing for hold of movement, not pushing into pain.  -cervical AROM side bend 10x 5 second hold each side; max cueing to remain in pain free ROM requires tactile cueing for arc of motion.  -cervical extension flexion 10x with tactile cueing provided  -chin tuck 10x with max cueing for sequencing 5 second holds -scapular retractions 15x .  -5lb bar overhead reach 15x hold for 3-5 seconds -5lb bar chest press 15x -snow angels 10x   Supine <>sit with education on log roll and safe technique for reduced torsion on cervical spine.    Seated: Cervical rotation slow controlled movements 10x each side Cervical side bend to same side 10x; max cueing for keeping head straight and sidebend only Scapular retraction and depression with upright posture 5x10 second holds  5lb  Bar row 15x Shoulder abduction "chicken wing" 15x STM to cervical paraspinals and upper trap with implementation of effleurage and ptrissage x 9 minutes   Standing: Wall posture 10x 5 second holds max cueing for placement of feet and shoulders.  Wall abduction flys 10x very challenging    in // bars: airex pad:  -static stand 30 seconds -static stand eyes closed 30 seconds -5lb bar row10x very challenging for patient one near LOB -5lb bar: straight arm row 10x; one near LOB  Pt educated throughout session about proper posture and technique with exercises. Improved exercise technique, movement at target joints, use of target muscles after min to mod verbal, visual, tactile cues.     Patient continues to require education on HEP despite education being performed every session since evaluation demonstrating no carryover. Prognosis is poor at this time due to patient issues with compliance.  Patient requires the same conversation each session about exercises, safety with neck, proper performance of HEP,  Posture, etc. Patient would benefit from skilled physical therapy to improve  quality of life, will continue with current POC at this time                       PT Education - 10/12/20 0717    Education Details exercise technique, body Dealer, HEP performance    Person(s) Educated Patient    Methods Explanation;Demonstration;Tactile cues;Verbal cues    Comprehension Verbalized understanding;Returned demonstration;Verbal cues required;Tactile cues required            PT Short Term Goals - 09/21/20 0725      PT SHORT TERM GOAL #1   Title After 4 weeks pt will reports less fatigue in her neck, report only requiring the cervical soft collar <4 days per week.    Baseline Feels need for collar most afternoons due to fatigue 5/12: wears 2  days a week    Time 4    Period Weeks    Status Achieved    Target Date 09/14/20      PT SHORT TERM GOAL #2   Title After 4 weeks pt to demonstrate improved 5xSTS <12 seconds and without LOB.    Baseline 13.67sec c LOB on 2nd rep of 6 5/12: 13.08 no LOB    Time 4    Period Weeks    Status Partially Met    Target Date 09/14/20      PT SHORT TERM GOAL #3   Title After 4 weeks pt to demonstrate improve lumbopelvic motor control AEB ability to ATTEMPT SLS multiple times without onset cramping in legs.    Baseline has severe cramping limitations in hamstrings during testing on eval    Time 4    Period Weeks    Status Partially Met    Target Date 09/14/20             PT Long Term Goals - 09/21/20 0726      PT LONG TERM GOAL #1   Title After 6 weeks pt to score >80 on FOTO survey to demonstrate improved participation in life.    Baseline 77 at eval 5/12: 69%    Time 8    Period Weeks    Status On-going    Target Date 10/12/20      PT LONG TERM GOAL #2   Title After 8 weeks pt to demonstrate 5xSTS in <12sec eyes closed without LOB to demonstrate improved dynamic motor control.    Baseline eyes open at eval 5/12: eyes open 13.08    Time 8    Period Weeks    Status Partially Met    Target Date  10/12/20      PT LONG TERM GOAL #3   Title After 8 weeks pt to demonstrate improved balance AEB SLS >5sec bilat without use of arms for righting upon failure thereafter.    Baseline <3 sec SLS, no use of arms for righting, but arresting hamstrings cramps bilat    Time 8    Period Weeks    Status Partially Met    Target Date 10/13/20                 Plan - 10/12/20 0726    Clinical Impression Statement Patient continues to require education on HEP despite education being performed every session since evaluation demonstrating no carryover. Prognosis is poor at this time due to patient issues with compliance.  Patient requires the same conversation each session about exercises, safety with neck, proper performance of HEP,  Posture, etc. Patient would benefit from skilled physical therapy to improve quality of life, will continue with current POC at this time    Personal Factors and Comorbidities Age;Sex;Behavior Pattern;Comorbidity 1;Fitness;Time since onset of injury/illness/exacerbation    Comorbidities osteoporosis    Examination-Activity Limitations Bend;Bathing;Dressing;Lift;Reach Overhead;Stand;Stairs    Examination-Participation Restrictions Driving;Community Activity;Yard Work;Cleaning    Stability/Clinical Decision Making Unstable/Unpredictable    Rehab Potential Fair    PT Frequency 2x / week    PT Duration 12 weeks    PT Treatment/Interventions ADLs/Self Care Home Management;Cryotherapy;Electrical Stimulation;Moist Heat;DME Instruction;Gait training;Stair training;Functional mobility training;Therapeutic activities;Therapeutic exercise;Balance training;Neuromuscular re-education;Patient/family education;Passive range of motion    PT Next Visit Plan Discuss plan of PT and limitations of care, particualrly regarding return to driivng. Screen right shoulder moblity/strength for lifting activity at home, cervical extension/flexion A/ROM assessent (already permitted for [employer]); Exhaustive  balance screening to address 5+ years of falls and orthopedic insult.    PT Home Exercise Plan None issued at eval; to be determine.    Consulted and Agree with Plan of Care Patient           Patient will benefit from skilled therapeutic intervention in order to improve the following deficits and impairments:  Decreased balance,Abnormal gait,Decreased activity tolerance,Decreased knowledge of use of DME,Decreased knowledge of precautions,Decreased mobility,Decreased range of motion,Decreased strength,Hypomobility,Increased muscle spasms,Postural dysfunction,Impaired UE functional use  Visit Diagnosis: Abnormal head movements  Repeated falls  Muscle weakness (generalized)     Problem List Patient Active Problem List   Diagnosis Date Noted  . Status post reverse total shoulder replacement, right 09/24/2018  . S/P right colectomy 01/26/2018  . Adenomatous polyp of ascending colon 11/24/2017  . Generalized anxiety disorder 09/06/2017  . Irritable bowel syndrome (IBS) 09/06/2017  . Fracture of one rib, right side, subsequent encounter for fracture with routine healing 03/08/2017  . Orthostasis 06/05/2016  . Left shoulder pain 10/13/2015  . Constipation 05/30/2015  . Osteoporosis, postmenopausal 05/30/2015  . Compression fracture of L2 lumbar vertebra with delayed healing 05/29/2015  . Frequent falls 03/26/2015  . Atherosclerosis of arteries 03/26/2015  . Chest pain 02/09/2015  . Breast cancer screening 10/01/2014  . Family history of colon cancer 09/28/2014  . Viral URI with cough 06/12/2014  . Tenesmus (rectal) 06/12/2014  . TMJ (temporomandibular joint syndrome) 06/12/2014  . Varicose veins of both lower extremities without ulcer or inflammation 06/12/2014  . B12 deficiency 09/20/2013  . Fatigue 05/23/2013  . Internal hemorrhoids without complication 78/47/8412  . Insomnia 03/03/2013  . Vertigo due to cerebrovascular disease 03/03/2013  .  Seronegative rheumatoid arthritis affecting lower leg ([place]) 03/03/2013   Janna Arch, PT, DPT   10/12/2020, 7:58 AM  [place] MAIN [facility] SERVICES [address] [place], [place], [address] Phone: [phone]   Fax:  [phone]  Name: Teresa Lin MRN: [id number] Date of Birth: July 16, 1931

## 2020-10-16 ENCOUNTER — Ambulatory Visit: Payer: Medicare Other

## 2020-10-16 ENCOUNTER — Encounter: Payer: Medicare Other | Admitting: Speech Pathology

## 2020-10-16 ENCOUNTER — Other Ambulatory Visit: Payer: Self-pay

## 2020-10-16 DIAGNOSIS — R296 Repeated falls: Secondary | ICD-10-CM

## 2020-10-16 DIAGNOSIS — M6281 Muscle weakness (generalized): Secondary | ICD-10-CM

## 2020-10-16 DIAGNOSIS — R250 Abnormal head movements: Secondary | ICD-10-CM | POA: Diagnosis not present

## 2020-10-16 NOTE — Therapy (Signed)
[place] MAIN [facility] SERVICES [address] [place], [place], [address] Phone: [phone]   Fax:  [phone]  Physical Therapy Treatment  Patient Details  Name: Teresa Lin MRN: [id number] Date of Birth: Oct 26, 1931 Referring Provider (PT): [place] Shah,MD (Neurology)   Encounter Date: 10/16/2020   PT End of Session - 10/16/20 0804    Visit Number 14    Number of Visits 17    Date for PT Re-Evaluation 11/09/20    Authorization Type Medicare Parts A and B;  3/10 PN 09/21/20    Authorization Time Period 08/17/20-11/09/20    PT Start Time 0800    PT Stop Time 0843    PT Time Calculation (min) 43 min    Activity Tolerance Patient tolerated treatment well;No increased pain;Patient limited by fatigue    Behavior During Therapy [facility] for tasks assessed/performed           Past Medical History:  Diagnosis Date  . Anemia   . Anxiety   . Arthritis    osteoarthritis. Bilateral knee replacement, hands, meniscal tear, cervical disc disease  . Atherosclerosis   . Atypical chest pain   . Collagen vascular disease ([place])   . Colon polyp 12/15/2017  . Colon polyps   . Compression fracture of L2 ([place])   . Diverticulosis   . DVT (deep venous thrombosis) ([place])   . GERD (gastroesophageal reflux disease)   . History of seronegative inflammatory arthritis   . Hypertension   . IBS (irritable bowel syndrome)   . Internal hemorrhoids   . Phlebitis and thrombophlebitis of deep veins of lower extremities (HCC)    after surgery  . Vision abnormalities     Past Surgical History:  Procedure Laterality Date  . APPENDECTOMY    . COLONOSCOPY N/A 01/24/2020   Procedure: COLONOSCOPY;  Surgeon: Lesly Rubenstein, MD;  Location: [facility] ENDOSCOPY;  Service: Endoscopy;  Laterality: N/A;  . COLONOSCOPY WITH PROPOFOL N/A 10/24/2017   Procedure: COLONOSCOPY WITH PROPOFOL;  Surgeon: Manya Silvas, MD;  Location: [facility] ENDOSCOPY;  Service: Endoscopy;  Laterality: N/A;  .  COLONOSCOPY WITH PROPOFOL N/A 11/21/2017   Procedure: COLONOSCOPY WITH PROPOFOL;  Surgeon: Manya Silvas, MD;  Location: [facility] ENDOSCOPY;  Service: Endoscopy;  Laterality: N/A;  . DILATION AND CURETTAGE OF UTERUS    . ESOPHAGOGASTRODUODENOSCOPY (EGD) WITH PROPOFOL N/A 02/25/2018   Procedure: ESOPHAGOGASTRODUODENOSCOPY (EGD) WITH PROPOFOL;  Surgeon: Manya Silvas, MD;  Location: [facility] ENDOSCOPY;  Service: Endoscopy;  Laterality: N/A;  . EYE SURGERY     bilateral catarACT  . JOINT REPLACEMENT Bilateral    knee replacement  . LAPAROSCOPIC RIGHT COLECTOMY Right 12/15/2017   Procedure: LAPAROSCOPIC RIGHT COLECTOMY;  Surgeon: Robert Bellow, MD;  Location: [facility] ORS;  Service: General;  Laterality: Right;  . REPLACEMENT TOTAL KNEE BILATERAL    . TOTAL SHOULDER ARTHROPLASTY Right 09/24/2018   Procedure: TOTAL SHOULDER ARTHROPLASTY - REVERSE RIGHT;  Surgeon: Corky Mull, MD;  Location: [facility] ORS;  Service: Orthopedics;  Laterality: Right;    There were no vitals filed for this visit.   Subjective Assessment - 10/16/20 0805    Subjective Patient reports having a good weekend, reports she did her HEP but is having pian.    Pertinent History HPI    Limitations Reading    How long can you sit comfortably? Neck does not limit.    How long can you stand comfortably? Neck does not limit.    How long can you walk  comfortably? Can sometimes be sore, but not limited.    Currently in Pain? Yes    Pain Score 1     Pain Location Neck    Pain Orientation Right    Pain Descriptors / Indicators Aching    Pain Type Chronic pain    Pain Onset More than a month ago    Pain Frequency Constant                    TherEx: Supine: -cervical AROM pain free with education on remaining in pain free arc of motion 10x 5 second hold cervical rotation each direction; more challenging to the R than left. education on pain free range of motion ; max cueing for hold of movement, not pushing into pain.   -cervical AROM side bend 10x 5 second hold each side; max cueing to remain in pain free ROM requires tactile cueing for arc of motion. . Patient still requires assistance for performance -cervical extension flexion 10x with tactile cueing provided ; improved with decreased cues required  -chin tuck 10x with max cueing for sequencing 5 second holds -scapular retractions 15x .  -snow angels 10x -bilateral UE flexion 10x 5 second holds   Supine <>sit with education on log roll and safe technique for reduced torsion on cervical spine.    Seated: Cervical rotation slow controlled movements 10x each side Cervical side bend to same side 10x; max cueing for keeping head straight and sidebend only Scapular retraction and depression with upright posture 5x10 second holds Shoulder abduction "chicken wing" 15x STM to cervical paraspinals and upper trap with implementation of effleurage and ptrissage x 9 minutes   Standing: Wall posture 10x 5 second holds max cueing for placement of feet and shoulders.  Wall cervical rotation with shoulders retracted to wall 10x PVC pipe overhead raise 10x  Wall abduction flys 10x very challenging    Access Code: [email or web address] URL: [email or web address] Date: 10/16/2020 Prepared by: Janna Arch  Exercises Neck Rotation - 1 x daily - 7 x weekly - 2 sets - 10 reps - 5 hold Neck Sidebending - 1 x daily - 7 x weekly - 2 sets - 10 reps - 5 hold Seated Cervical Retraction and Extension - 1 x daily - 7 x weekly - 2 sets - 10 reps - 5 hold    Pt educated throughout session about proper posture and technique with exercises. Improved exercise technique, movement at target joints, use of target muscles after min to mod verbal, visual, tactile cues.     Patient still requires assistance for for side bend intervention indicating she is still not competent with home program at this time however he other exercises are improving allowing for progression to  gravity resisted positioning.  Patient requires the same conversation each session about exercises, safety with neck, proper performance of HEP,  Posture, etc. Patient would benefit from skilled physical therapy to improve quality of life, will continue with current POC at this time                       PT Education - 10/16/20 0803    Education Details exercise technique, body mechanics    Person(s) Educated Patient    Methods Explanation;Demonstration;Tactile cues;Verbal cues    Comprehension Verbalized understanding;Returned demonstration;Verbal cues required;Tactile cues required            PT Short Term Goals - 09/21/20 0725      PT SHORT TERM GOAL #1  Title After 4 weeks pt will reports less fatigue in her neck, report only requiring the cervical soft collar <4 days per week.    Baseline Feels need for collar most afternoons due to fatigue 5/12: wears 2 days a week    Time 4    Period Weeks    Status Achieved    Target Date 09/14/20      PT SHORT TERM GOAL #2   Title After 4 weeks pt to demonstrate improved 5xSTS <12 seconds and without LOB.    Baseline 13.67sec c LOB on 2nd rep of 6 5/12: 13.08 no LOB    Time 4    Period Weeks    Status Partially Met    Target Date 09/14/20      PT SHORT TERM GOAL #3   Title After 4 weeks pt to demonstrate improve lumbopelvic motor control AEB ability to ATTEMPT SLS multiple times without onset cramping in legs.    Baseline has severe cramping limitations in hamstrings during testing on eval    Time 4    Period Weeks    Status Partially Met    Target Date 09/14/20             PT Long Term Goals - 09/21/20 0726      PT LONG TERM GOAL #1   Title After 6 weeks pt to score >80 on FOTO survey to demonstrate improved participation in life.    Baseline 77 at eval 5/12: 69%    Time 8    Period Weeks    Status On-going    Target Date 10/12/20      PT LONG TERM GOAL #2   Title After 8 weeks pt to demonstrate  5xSTS in <12sec eyes closed without LOB to demonstrate improved dynamic motor control.    Baseline eyes open at eval 5/12: eyes open 13.08    Time 8    Period Weeks    Status Partially Met    Target Date 10/12/20      PT LONG TERM GOAL #3   Title After 8 weeks pt to demonstrate improved balance AEB SLS >5sec bilat without use of arms for righting upon failure thereafter.    Baseline <3 sec SLS, no use of arms for righting, but arresting hamstrings cramps bilat    Time 8    Period Weeks    Status Partially Met    Target Date 10/13/20                 Plan - 10/16/20 9628    Clinical Impression Statement Patient still requires assistance for for side bend intervention indicating she is still not competent with home program at this time however he other exercises are improving allowing for progression to gravity resisted positioning.  Patient requires the same conversation each session about exercises, safety with neck, proper performance of HEP,  Posture, etc. Patient would benefit from skilled physical therapy to improve quality of life, will continue with current POC at this time    Personal Factors and Comorbidities Age;Sex;Behavior Pattern;Comorbidity 1;Fitness;Time since onset of injury/illness/exacerbation    Comorbidities osteoporosis    Examination-Activity Limitations Bend;Bathing;Dressing;Lift;Reach Overhead;Stand;Stairs    Examination-Participation Restrictions Driving;Community Activity;Yard Work;Cleaning    Stability/Clinical Decision Making Unstable/Unpredictable    Rehab Potential Fair    PT Frequency 2x / week    PT Duration 12 weeks    PT Treatment/Interventions ADLs/Self Care Home Management;Cryotherapy;Electrical Stimulation;Moist Heat;DME Instruction;Gait training;Stair training;Functional mobility training;Therapeutic activities;Therapeutic exercise;Balance training;Neuromuscular re-education;Patient/family education;Passive  range of motion    PT Next Visit Plan  Discuss plan of PT and limitations of care, particualrly regarding return to driivng. Screen right shoulder moblity/strength for lifting activity at home, cervical extension/flexion A/ROM assessent (already permitted for [employer]); Exhaustive balance screening to address 5+ years of falls and orthopedic insult.    PT Home Exercise Plan None issued at eval; to be determine.    Consulted and Agree with Plan of Care Patient           Patient will benefit from skilled therapeutic intervention in order to improve the following deficits and impairments:  Decreased balance,Abnormal gait,Decreased activity tolerance,Decreased knowledge of use of DME,Decreased knowledge of precautions,Decreased mobility,Decreased range of motion,Decreased strength,Hypomobility,Increased muscle spasms,Postural dysfunction,Impaired UE functional use  Visit Diagnosis: Abnormal head movements  Repeated falls  Muscle weakness (generalized)     Problem List Patient Active Problem List   Diagnosis Date Noted  . Status post reverse total shoulder replacement, right 09/24/2018  . S/P right colectomy 01/26/2018  . Adenomatous polyp of ascending colon 11/24/2017  . Generalized anxiety disorder 09/06/2017  . Irritable bowel syndrome (IBS) 09/06/2017  . Fracture of one rib, right side, subsequent encounter for fracture with routine healing 03/08/2017  . Orthostasis 06/05/2016  . Left shoulder pain 10/13/2015  . Constipation 05/30/2015  . Osteoporosis, postmenopausal 05/30/2015  . Compression fracture of L2 lumbar vertebra with delayed healing 05/29/2015  . Frequent falls 03/26/2015  . Atherosclerosis of arteries 03/26/2015  . Chest pain 02/09/2015  . Breast cancer screening 10/01/2014  . Family history of colon cancer 09/28/2014  . Viral URI with cough 06/12/2014  . Tenesmus (rectal) 06/12/2014  . TMJ (temporomandibular joint syndrome) 06/12/2014  . Varicose veins of both lower extremities without ulcer or  inflammation 06/12/2014  . B12 deficiency 09/20/2013  . Fatigue 05/23/2013  . Internal hemorrhoids without complication 34/19/3790  . Insomnia 03/03/2013  . Vertigo due to cerebrovascular disease 03/03/2013  . Seronegative rheumatoid arthritis affecting lower leg ([place]) 03/03/2013   Janna Arch, PT, DPT   10/16/2020, 9:29 AM  [place] MAIN [facility] SERVICES [address] [place], [place], [address] Phone: [phone]   Fax:  [phone]  Name: Teresa Lin MRN: [id number] Date of Birth: [birth year]

## 2020-10-18 ENCOUNTER — Encounter: Payer: Medicare Other | Admitting: Speech Pathology

## 2020-10-19 ENCOUNTER — Ambulatory Visit: Payer: Medicare Other

## 2020-10-23 ENCOUNTER — Ambulatory Visit: Payer: Medicare Other

## 2020-10-23 ENCOUNTER — Encounter: Payer: Medicare Other | Admitting: Speech Pathology

## 2020-10-24 ENCOUNTER — Other Ambulatory Visit: Payer: Self-pay

## 2020-10-24 ENCOUNTER — Ambulatory Visit: Payer: Medicare Other

## 2020-10-24 DIAGNOSIS — M6281 Muscle weakness (generalized): Secondary | ICD-10-CM

## 2020-10-24 DIAGNOSIS — R250 Abnormal head movements: Secondary | ICD-10-CM

## 2020-10-24 DIAGNOSIS — R296 Repeated falls: Secondary | ICD-10-CM

## 2020-10-24 NOTE — Therapy (Signed)
[place] MAIN [facility] SERVICES [address] [place], [place], [address] Phone: [phone]   Fax:  [phone]  Physical Therapy Treatment  Patient Details  Name: Teresa Lin MRN: [id number] Date of Birth: 1931/08/16 Referring Provider (PT): [place] Shah,MD (Neurology)   Encounter Date: 10/24/2020   PT End of Session - 10/24/20 0724     Visit Number 15    Number of Visits 17    Date for PT Re-Evaluation 11/09/20    Authorization Type Medicare Parts A and B;  3/10 PN 09/21/20    Authorization Time Period 08/17/20-11/09/20    PT Start Time 0717    PT Stop Time 0759    PT Time Calculation (min) 42 min    Activity Tolerance Patient tolerated treatment well;No increased pain;Patient limited by fatigue    Behavior During Therapy [facility] for tasks assessed/performed             Past Medical History:  Diagnosis Date   Anemia    Anxiety    Arthritis    osteoarthritis. Bilateral knee replacement, hands, meniscal tear, cervical disc disease   Atherosclerosis    Atypical chest pain    Collagen vascular disease (HCC)    Colon polyp 12/15/2017   Colon polyps    Compression fracture of L2 (HCC)    Diverticulosis    DVT (deep venous thrombosis) (HCC)    GERD (gastroesophageal reflux disease)    History of seronegative inflammatory arthritis    Hypertension    IBS (irritable bowel syndrome)    Internal hemorrhoids    Phlebitis and thrombophlebitis of deep veins of lower extremities ([place])    after surgery   Vision abnormalities     Past Surgical History:  Procedure Laterality Date   APPENDECTOMY     COLONOSCOPY N/A 01/24/2020   Procedure: COLONOSCOPY;  Surgeon: Lesly Rubenstein, MD;  Location: [facility] ENDOSCOPY;  Service: Endoscopy;  Laterality: N/A;   COLONOSCOPY WITH PROPOFOL N/A 10/24/2017   Procedure: COLONOSCOPY WITH PROPOFOL;  Surgeon: Manya Silvas, MD;  Location: [facility] ENDOSCOPY;  Service: Endoscopy;  Laterality: N/A;   COLONOSCOPY  WITH PROPOFOL N/A 11/21/2017   Procedure: COLONOSCOPY WITH PROPOFOL;  Surgeon: Manya Silvas, MD;  Location: [facility] ENDOSCOPY;  Service: Endoscopy;  Laterality: N/A;   DILATION AND CURETTAGE OF UTERUS     ESOPHAGOGASTRODUODENOSCOPY (EGD) WITH PROPOFOL N/A 02/25/2018   Procedure: ESOPHAGOGASTRODUODENOSCOPY (EGD) WITH PROPOFOL;  Surgeon: Manya Silvas, MD;  Location: [facility] ENDOSCOPY;  Service: Endoscopy;  Laterality: N/A;   EYE SURGERY     bilateral catarACT   JOINT REPLACEMENT Bilateral    knee replacement   LAPAROSCOPIC RIGHT COLECTOMY Right 12/15/2017   Procedure: LAPAROSCOPIC RIGHT COLECTOMY;  Surgeon: Robert Bellow, MD;  Location: [facility] ORS;  Service: General;  Laterality: Right;   REPLACEMENT TOTAL KNEE BILATERAL     TOTAL SHOULDER ARTHROPLASTY Right 09/24/2018   Procedure: TOTAL SHOULDER ARTHROPLASTY - REVERSE RIGHT;  Surgeon: Corky Mull, MD;  Location: [facility] ORS;  Service: Orthopedics;  Laterality: Right;    There were no vitals filed for this visit.   Subjective Assessment - 10/24/20 0722     Subjective ?Patient reports she only did her new Hep once, reports she pushed herself too hard and thinks she did something wrong because it hurts.    Pertinent History HPI    Limitations Reading    How long can you sit comfortably? Neck does not limit.    How long can you stand  comfortably? Neck does not limit.    How long can you walk comfortably? Can sometimes be sore, but not limited.    Currently in Pain? Yes    Pain Score 2     Pain Location Neck    Pain Orientation Right;Left    Pain Descriptors / Indicators Aching    Pain Type Chronic pain    Pain Onset More than a month ago    Pain Frequency Constant               Patient reports she only did her new Hep once, reports she pushed herself too hard and thinks she did something wrong because it hurts.   TherEx   Seated: Cervical rotation slow controlled movements 10x each side 5 second holds, cue for slowing  down motion Cervical side bend to same side 10x; max cueing for keeping head straight and sidebend only; 5 second holds  Cervical extension and flexion with cues for pain free range of motion 10x 5 second holds Scapular retraction and depression with upright posture 5x10 second holds Shoulder abduction "chicken wing" 15x STM to cervical paraspinals and upper trap with implementation of effleurage and ptrissage x 9 minutes RTB ER 15x  RTB rows 10x  Standing: Wall posture 10x 5 second holds max cueing for placement of feet and shoulders.  Wall cervical rotation with shoulders retracted to wall 10x PVC pipe overhead raise 10x  Wall abduction flys 10x very challenging    manual: STM to cervical paraspinals and upper trap with implementation of effleurage and ptrissage x 9 minutes    Pt educated throughout session about proper posture and technique with exercises. Improved exercise technique, movement at target joints, use of target muscles after min to mod verbal, visual, tactile cues.   Patient requires re-education on HEP and importance of not pushing to pain, only to stretch despite previous warnings every session. New HEP reviewed again with corrections made to speed and frequency of movement. Prognosis continues to be guarded due to limited understanding of not pushing into pain, precautions on neck, and compliance with HEP. However patient is able to tolerate e of motion in gravity resisted positions this session indicating slight improvement. Patient would benefit from skilled physical therapy to improve quality of life, will continue with current POC at this time                 PT Education - 10/24/20 0724     Education Details exercise technique, body mechanics    Person(s) Educated Patient    Methods Explanation;Demonstration;Tactile cues;Verbal cues    Comprehension Verbalized understanding;Returned demonstration;Verbal cues required;Tactile cues required               PT Short Term Goals - 09/21/20 0725       PT SHORT TERM GOAL #1   Title After 4 weeks pt will reports less fatigue in her neck, report only requiring the cervical soft collar <4 days per week.    Baseline Feels need for collar most afternoons due to fatigue 5/12: wears 2 days a week    Time 4    Period Weeks    Status Achieved    Target Date 09/14/20      PT SHORT TERM GOAL #2   Title After 4 weeks pt to demonstrate improved 5xSTS <12 seconds and without LOB.    Baseline 13.67sec c LOB on 2nd rep of 6 5/12: 13.08 no LOB    Time 4  Period Weeks    Status Partially Met    Target Date 09/14/20      PT SHORT TERM GOAL #3   Title After 4 weeks pt to demonstrate improve lumbopelvic motor control AEB ability to ATTEMPT SLS multiple times without onset cramping in legs.    Baseline has severe cramping limitations in hamstrings during testing on eval    Time 4    Period Weeks    Status Partially Met    Target Date 09/14/20               PT Long Term Goals - 09/21/20 0726       PT LONG TERM GOAL #1   Title After 6 weeks pt to score >80 on FOTO survey to demonstrate improved participation in life.    Baseline 77 at eval 5/12: 69%    Time 8    Period Weeks    Status On-going    Target Date 10/12/20      PT LONG TERM GOAL #2   Title After 8 weeks pt to demonstrate 5xSTS in <12sec eyes closed without LOB to demonstrate improved dynamic motor control.    Baseline eyes open at eval 5/12: eyes open 13.08    Time 8    Period Weeks    Status Partially Met    Target Date 10/12/20      PT LONG TERM GOAL #3   Title After 8 weeks pt to demonstrate improved balance AEB SLS >5sec bilat without use of arms for righting upon failure thereafter.    Baseline <3 sec SLS, no use of arms for righting, but arresting hamstrings cramps bilat    Time 8    Period Weeks    Status Partially Met    Target Date 10/13/20                   Plan - 10/24/20 0732      Clinical Impression Statement Patient requires re-education on HEP and importance of not pushing to pain, only to stretch despite previous warnings every session. New HEP reviewed again with corrections made to speed and frequency of movement. Prognosis continues to be guarded due to limited understanding of not pushing into pain, precautions on neck, and compliance with HEP. However patient is able to tolerate e of motion in gravity resisted positions this session indicating slight improvement. Patient would benefit from skilled physical therapy to improve quality of life, will continue with current POC at this time    Personal Factors and Comorbidities Age;Sex;Behavior Pattern;Comorbidity 1;Fitness;Time since onset of injury/illness/exacerbation    Comorbidities osteoporosis    Examination-Activity Limitations Bend;Bathing;Dressing;Lift;Reach Overhead;Stand;Stairs    Examination-Participation Restrictions Driving;Community Activity;Yard Work;Cleaning    Stability/Clinical Decision Making Unstable/Unpredictable    Rehab Potential Fair    PT Frequency 2x / week    PT Duration 12 weeks    PT Treatment/Interventions ADLs/Self Care Home Management;Cryotherapy;Electrical Stimulation;Moist Heat;DME Instruction;Gait training;Stair training;Functional mobility training;Therapeutic activities;Therapeutic exercise;Balance training;Neuromuscular re-education;Patient/family education;Passive range of motion    PT Next Visit Plan Discuss plan of PT and limitations of care, particualrly regarding return to driivng. Screen right shoulder moblity/strength for lifting activity at home, cervical extension/flexion A/ROM assessent (already permitted for [employer]); Exhaustive balance screening to address 5+ years of falls and orthopedic insult.    PT Home Exercise Plan None issued at eval; to be determine.    Consulted and Agree with Plan of Care Patient  Patient will benefit from skilled therapeutic  intervention in order to improve the following deficits and impairments:  Decreased balance, Abnormal gait, Decreased activity tolerance, Decreased knowledge of use of DME, Decreased knowledge of precautions, Decreased mobility, Decreased range of motion, Decreased strength, Hypomobility, Increased muscle spasms, Postural dysfunction, Impaired UE functional use  Visit Diagnosis: Abnormal head movements  Repeated falls  Muscle weakness (generalized)     Problem List Patient Active Problem List   Diagnosis Date Noted   Status post reverse total shoulder replacement, right 09/24/2018   S/P right colectomy 01/26/2018   Adenomatous polyp of ascending colon 11/24/2017   Generalized anxiety disorder 09/06/2017   Irritable bowel syndrome (IBS) 09/06/2017   Fracture of one rib, right side, subsequent encounter for fracture with routine healing 03/08/2017   Orthostasis 06/05/2016   Left shoulder pain 10/13/2015   Constipation 05/30/2015   Osteoporosis, postmenopausal 05/30/2015   Compression fracture of L2 lumbar vertebra with delayed healing 05/29/2015   Frequent falls 03/26/2015   Atherosclerosis of arteries 03/26/2015   Chest pain 02/09/2015   Breast cancer screening 10/01/2014   Family history of colon cancer 09/28/2014   Viral URI with cough 06/12/2014   Tenesmus (rectal) 06/12/2014   TMJ (temporomandibular joint syndrome) 06/12/2014   Varicose veins of both lower extremities without ulcer or inflammation 06/12/2014   B12 deficiency 09/20/2013   Fatigue 05/23/2013   Internal hemorrhoids without complication 24/93/2419   Insomnia 03/03/2013   Vertigo due to cerebrovascular disease 03/03/2013   Seronegative rheumatoid arthritis affecting lower leg ([place]) 03/03/2013   Janna Arch, PT, DPT  10/24/2020, 8:03 AM  [place] MAIN [facility] SERVICES [address] [place], [place], [address] Phone: [phone]   Fax:  [phone]  Name: Teresa Lin MRN: [id number] Date of Birth: [birth year]

## 2020-10-25 ENCOUNTER — Encounter: Payer: Medicare Other | Admitting: Speech Pathology

## 2020-10-26 ENCOUNTER — Ambulatory Visit: Payer: Medicare Other

## 2020-10-26 ENCOUNTER — Other Ambulatory Visit: Payer: Self-pay

## 2020-10-26 DIAGNOSIS — R250 Abnormal head movements: Secondary | ICD-10-CM | POA: Diagnosis not present

## 2020-10-26 DIAGNOSIS — M6281 Muscle weakness (generalized): Secondary | ICD-10-CM

## 2020-10-26 DIAGNOSIS — R296 Repeated falls: Secondary | ICD-10-CM

## 2020-10-26 NOTE — Therapy (Signed)
[place] MAIN [facility] SERVICES [address] [place], [place], [address] Phone: [phone]   Fax:  [phone]  Physical Therapy Treatment  Patient Details  Name: Teresa Lin MRN: [id number] Date of Birth: 1931/11/01 Referring Provider (PT): Ashkum Shah,MD (Neurology)   Encounter Date: 10/26/2020   PT End of Session - 10/26/20 0817     Visit Number 16    Number of Visits 17    Date for PT Re-Evaluation 11/09/20    Authorization Type Medicare Parts A and B;  3/10 PN 09/21/20    Authorization Time Period 08/17/20-11/09/20    PT Start Time 0807    PT Stop Time 0845    PT Time Calculation (min) 38 min    Activity Tolerance Patient tolerated treatment well;No increased pain;Patient limited by fatigue    Behavior During Therapy [facility] for tasks assessed/performed             Past Medical History:  Diagnosis Date   Anemia    Anxiety    Arthritis    osteoarthritis. Bilateral knee replacement, hands, meniscal tear, cervical disc disease   Atherosclerosis    Atypical chest pain    Collagen vascular disease (HCC)    Colon polyp 12/15/2017   Colon polyps    Compression fracture of L2 (HCC)    Diverticulosis    DVT (deep venous thrombosis) (HCC)    GERD (gastroesophageal reflux disease)    History of seronegative inflammatory arthritis    Hypertension    IBS (irritable bowel syndrome)    Internal hemorrhoids    Phlebitis and thrombophlebitis of deep veins of lower extremities ([place])    after surgery   Vision abnormalities     Past Surgical History:  Procedure Laterality Date   APPENDECTOMY     COLONOSCOPY N/A 01/24/2020   Procedure: COLONOSCOPY;  Surgeon: Lesly Rubenstein, MD;  Location: [facility] ENDOSCOPY;  Service: Endoscopy;  Laterality: N/A;   COLONOSCOPY WITH PROPOFOL N/A 10/24/2017   Procedure: COLONOSCOPY WITH PROPOFOL;  Surgeon: Manya Silvas, MD;  Location: [facility] ENDOSCOPY;  Service: Endoscopy;  Laterality: N/A;   COLONOSCOPY  WITH PROPOFOL N/A 11/21/2017   Procedure: COLONOSCOPY WITH PROPOFOL;  Surgeon: Manya Silvas, MD;  Location: [facility] ENDOSCOPY;  Service: Endoscopy;  Laterality: N/A;   DILATION AND CURETTAGE OF UTERUS     ESOPHAGOGASTRODUODENOSCOPY (EGD) WITH PROPOFOL N/A 02/25/2018   Procedure: ESOPHAGOGASTRODUODENOSCOPY (EGD) WITH PROPOFOL;  Surgeon: Manya Silvas, MD;  Location: [facility] ENDOSCOPY;  Service: Endoscopy;  Laterality: N/A;   EYE SURGERY     bilateral catarACT   JOINT REPLACEMENT Bilateral    knee replacement   LAPAROSCOPIC RIGHT COLECTOMY Right 12/15/2017   Procedure: LAPAROSCOPIC RIGHT COLECTOMY;  Surgeon: Robert Bellow, MD;  Location: [facility] ORS;  Service: General;  Laterality: Right;   REPLACEMENT TOTAL KNEE BILATERAL     TOTAL SHOULDER ARTHROPLASTY Right 09/24/2018   Procedure: TOTAL SHOULDER ARTHROPLASTY - REVERSE RIGHT;  Surgeon: Corky Mull, MD;  Location: [facility] ORS;  Service: Orthopedics;  Laterality: Right;    There were no vitals filed for this visit.   Subjective Assessment - 10/26/20 0815     Subjective Patient arrives late to PT session, reports she has been trying to do her HEP.    Pertinent History HPI    Limitations Reading    How long can you sit comfortably? Neck does not limit.    How long can you stand comfortably? Neck does not limit.    How  long can you walk comfortably? Can sometimes be sore, but not limited.    Currently in Pain? No/denies                   TherEx   Seated: Cervical rotation slow controlled movements 10x each side 5 second holds, cue for slowing down motion Cervical side bend to same side 10x; max cueing for keeping head straight and sidebend only; 5 second holds Cervical extension and flexion with cues for pain free range of motion 10x 5 second holds Scapular retraction and depression with upright posture 5x10 second holds Shoulder abduction "chicken wing" 15x RTB ER 15x RTB rows 10x   Standing: Wall posture 10x 5 second  holds max cueing for placement of feet and shoulders.  Wall cervical rotation with shoulders retracted to wall 10x PVC pipe overhead raise 10x  Wall abduction flys 10x very challenging     manual: STM to cervical paraspinals and upper trap with implementation of effleurage and ptrissage x 9 minutes     Pt educated throughout session about proper posture and technique with exercises. Improved exercise technique, movement at target joints, use of target muscles after min to mod verbal, visual, tactile cues.   Patient's session limited by late arrival. Patient still is challenged by cervical sidebending requiring max cueing for sequencing. She is unable to perform it without assistance at this time. She is educated on next week being recert day. Patient would benefit from skilled physical therapy to improve quality of life, will continue with current POC at this time                 PT Education - 10/26/20 0816     Education Details exercise technique, body mechanics    Person(s) Educated Patient    Methods Explanation;Demonstration;Tactile cues;Verbal cues    Comprehension Verbalized understanding;Returned demonstration;Verbal cues required;Tactile cues required              PT Short Term Goals - 09/21/20 0725       PT SHORT TERM GOAL #1   Title After 4 weeks pt will reports less fatigue in her neck, report only requiring the cervical soft collar <4 days per week.    Baseline Feels need for collar most afternoons due to fatigue 5/12: wears 2 days a week    Time 4    Period Weeks    Status Achieved    Target Date 09/14/20      PT SHORT TERM GOAL #2   Title After 4 weeks pt to demonstrate improved 5xSTS <12 seconds and without LOB.    Baseline 13.67sec c LOB on 2nd rep of 6 5/12: 13.08 no LOB    Time 4    Period Weeks    Status Partially Met    Target Date 09/14/20      PT SHORT TERM GOAL #3   Title After 4 weeks pt to demonstrate improve lumbopelvic motor  control AEB ability to ATTEMPT SLS multiple times without onset cramping in legs.    Baseline has severe cramping limitations in hamstrings during testing on eval    Time 4    Period Weeks    Status Partially Met    Target Date 09/14/20               PT Long Term Goals - 09/21/20 0726       PT LONG TERM GOAL #1   Title After 6 weeks pt to score >80 on FOTO survey  to demonstrate improved participation in life.    Baseline 77 at eval 5/12: 69%    Time 8    Period Weeks    Status On-going    Target Date 10/12/20      PT LONG TERM GOAL #2   Title After 8 weeks pt to demonstrate 5xSTS in <12sec eyes closed without LOB to demonstrate improved dynamic motor control.    Baseline eyes open at eval 5/12: eyes open 13.08    Time 8    Period Weeks    Status Partially Met    Target Date 10/12/20      PT LONG TERM GOAL #3   Title After 8 weeks pt to demonstrate improved balance AEB SLS >5sec bilat without use of arms for righting upon failure thereafter.    Baseline <3 sec SLS, no use of arms for righting, but arresting hamstrings cramps bilat    Time 8    Period Weeks    Status Partially Met    Target Date 10/13/20                   Plan - 10/26/20 0908     Clinical Impression Statement Patient's session limited by late arrival. Patient still is challenged by cervical sidebending requiring max cueing for sequencing. She is unable to perform it without assistance at this time. She is educated on next week being recert day. Patient would benefit from skilled physical therapy to improve quality of life, will continue with current POC at this time    Personal Factors and Comorbidities Age;Sex;Behavior Pattern;Comorbidity 1;Fitness;Time since onset of injury/illness/exacerbation    Comorbidities osteoporosis    Examination-Activity Limitations Bend;Bathing;Dressing;Lift;Reach Overhead;Stand;Stairs    Examination-Participation Restrictions Driving;Community Activity;Yard  Work;Cleaning    Stability/Clinical Decision Making Unstable/Unpredictable    Rehab Potential Fair    PT Frequency 2x / week    PT Duration 12 weeks    PT Treatment/Interventions ADLs/Self Care Home Management;Cryotherapy;Electrical Stimulation;Moist Heat;DME Instruction;Gait training;Stair training;Functional mobility training;Therapeutic activities;Therapeutic exercise;Balance training;Neuromuscular re-education;Patient/family education;Passive range of motion    PT Next Visit Plan Discuss plan of PT and limitations of care, particualrly regarding return to driivng. Screen right shoulder moblity/strength for lifting activity at home, cervical extension/flexion A/ROM assessent (already permitted for [employer]); Exhaustive balance screening to address 5+ years of falls and orthopedic insult.    PT Home Exercise Plan None issued at eval; to be determine.    Consulted and Agree with Plan of Care Patient             Patient will benefit from skilled therapeutic intervention in order to improve the following deficits and impairments:  Decreased balance, Abnormal gait, Decreased activity tolerance, Decreased knowledge of use of DME, Decreased knowledge of precautions, Decreased mobility, Decreased range of motion, Decreased strength, Hypomobility, Increased muscle spasms, Postural dysfunction, Impaired UE functional use  Visit Diagnosis: Abnormal head movements  Repeated falls  Muscle weakness (generalized)     Problem List Patient Active Problem List   Diagnosis Date Noted   Status post reverse total shoulder replacement, right 09/24/2018   S/P right colectomy 01/26/2018   Adenomatous polyp of ascending colon 11/24/2017   Generalized anxiety disorder 09/06/2017   Irritable bowel syndrome (IBS) 09/06/2017   Fracture of one rib, right side, subsequent encounter for fracture with routine healing 03/08/2017   Orthostasis 06/05/2016   Left shoulder pain 10/13/2015   Constipation  05/30/2015   Osteoporosis, postmenopausal 05/30/2015   Compression fracture of L2 lumbar vertebra with delayed  healing 05/29/2015   Frequent falls 03/26/2015   Atherosclerosis of arteries 03/26/2015   Chest pain 02/09/2015   Breast cancer screening 10/01/2014   Family history of colon cancer 09/28/2014   Viral URI with cough 06/12/2014   Tenesmus (rectal) 06/12/2014   TMJ (temporomandibular joint syndrome) 06/12/2014   Varicose veins of both lower extremities without ulcer or inflammation 06/12/2014   B12 deficiency 09/20/2013   Fatigue 05/23/2013   Internal hemorrhoids without complication 61/84/8592   Insomnia 03/03/2013   Vertigo due to cerebrovascular disease 03/03/2013   Seronegative rheumatoid arthritis affecting lower leg ([place]) 03/03/2013   Janna Arch, PT, DPT  10/26/2020, 9:09 AM  [place] MAIN [facility] SERVICES [address] [place], [place], [address] Phone: [phone]   Fax:  [phone]  Name: NARIYAH OSIAS MRN: [id number] Date of Birth: 01-22-1932

## 2020-10-30 ENCOUNTER — Encounter: Payer: Medicare Other | Admitting: Speech Pathology

## 2020-10-31 ENCOUNTER — Other Ambulatory Visit: Payer: Self-pay

## 2020-10-31 ENCOUNTER — Ambulatory Visit: Payer: Medicare Other

## 2020-10-31 DIAGNOSIS — M6281 Muscle weakness (generalized): Secondary | ICD-10-CM

## 2020-10-31 DIAGNOSIS — R296 Repeated falls: Secondary | ICD-10-CM

## 2020-10-31 DIAGNOSIS — R250 Abnormal head movements: Secondary | ICD-10-CM

## 2020-10-31 NOTE — Therapy (Signed)
[place] MAIN [facility] SERVICES [address] , [place], [address] Phone: [phone]   Fax:  [phone]  Physical Therapy Treatment/RECERT  Patient Details  Name: Teresa Lin MRN: [id number] Date of Birth: Mar 15, 1932 Referring Provider (PT): [place] Shah,MD (Neurology)   Encounter Date: 10/31/2020   PT End of Session - 10/31/20 0815     Visit Number 17    Number of Visits 33    Date for PT Re-Evaluation 12/26/20    Authorization Type Medicare Parts A and B;  7/10 PN 09/21/20    Authorization Time Period 08/17/20-11/09/20    PT Start Time 0802    PT Stop Time 8416    PT Time Calculation (min) 42 min    Activity Tolerance Patient tolerated treatment well;No increased pain;Patient limited by fatigue    Behavior During Therapy [facility] for tasks assessed/performed             Past Medical History:  Diagnosis Date   Anemia    Anxiety    Arthritis    osteoarthritis. Bilateral knee replacement, hands, meniscal tear, cervical disc disease   Atherosclerosis    Atypical chest pain    Collagen vascular disease (HCC)    Colon polyp 12/15/2017   Colon polyps    Compression fracture of L2 (HCC)    Diverticulosis    DVT (deep venous thrombosis) (HCC)    GERD (gastroesophageal reflux disease)    History of seronegative inflammatory arthritis    Hypertension    IBS (irritable bowel syndrome)    Internal hemorrhoids    Phlebitis and thrombophlebitis of deep veins of lower extremities ([place])    after surgery   Vision abnormalities     Past Surgical History:  Procedure Laterality Date   APPENDECTOMY     COLONOSCOPY N/A 01/24/2020   Procedure: COLONOSCOPY;  Surgeon: Lesly Rubenstein, MD;  Location: [facility] ENDOSCOPY;  Service: Endoscopy;  Laterality: N/A;   COLONOSCOPY WITH PROPOFOL N/A 10/24/2017   Procedure: COLONOSCOPY WITH PROPOFOL;  Surgeon: Manya Silvas, MD;  Location: [facility] ENDOSCOPY;  Service: Endoscopy;  Laterality: N/A;    COLONOSCOPY WITH PROPOFOL N/A 11/21/2017   Procedure: COLONOSCOPY WITH PROPOFOL;  Surgeon: Manya Silvas, MD;  Location: [facility] ENDOSCOPY;  Service: Endoscopy;  Laterality: N/A;   DILATION AND CURETTAGE OF UTERUS     ESOPHAGOGASTRODUODENOSCOPY (EGD) WITH PROPOFOL N/A 02/25/2018   Procedure: ESOPHAGOGASTRODUODENOSCOPY (EGD) WITH PROPOFOL;  Surgeon: Manya Silvas, MD;  Location: [facility] ENDOSCOPY;  Service: Endoscopy;  Laterality: N/A;   EYE SURGERY     bilateral catarACT   JOINT REPLACEMENT Bilateral    knee replacement   LAPAROSCOPIC RIGHT COLECTOMY Right 12/15/2017   Procedure: LAPAROSCOPIC RIGHT COLECTOMY;  Surgeon: Robert Bellow, MD;  Location: [facility] ORS;  Service: General;  Laterality: Right;   REPLACEMENT TOTAL KNEE BILATERAL     TOTAL SHOULDER ARTHROPLASTY Right 09/24/2018   Procedure: TOTAL SHOULDER ARTHROPLASTY - REVERSE RIGHT;  Surgeon: Corky Mull, MD;  Location: [facility] ORS;  Service: Orthopedics;  Laterality: Right;    There were no vitals filed for this visit.   Subjective Assessment - 10/31/20 0813     Subjective ?Patient reports she had a dizzy spell on Friday. Was in bed for a couple days but is back to normal.  Has not done her exercises since Thursday.    Pertinent History HPI    Limitations Reading    How long can you sit comfortably? Neck does not limit.  How long can you stand comfortably? Neck does not limit.    How long can you walk comfortably? Can sometimes be sore, but not limited.    Currently in Pain? No/denies                 Goals:    Report less fatigue in neck, wearing collar <4 days /week: rarely wears it.  5x STS: 12.65 seconds eyes closed SLS : L 15 seconds R 8 seconds  FOTO: 85%  Cervical rotation     Right Left  Flexion 35  Extension 15  Side Bending 11 12  Rotation 41 35    NDI: 21%  Treatment:   therex Seated: Cervical rotation slow controlled movements 10x each side 5 second holds, cue for slowing down  motion Cervical side bend to same side 10x; max cueing for keeping head straight and sidebend only; 5 second holds Cervical extension and flexion with cues for pain free range of motion 10x 5 second holds RTB rows 10x  manual: STM to cervical paraspinals and upper trap with implementation of effleurage and ptrissage x 9 minutes Supine:  Cervical rotation with overpressure at glenohumeral joint 4x 45 second holds Suboccipital release 3x45 seconds     Pt educated throughout session about proper posture and technique with exercises. Improved exercise technique, movement at target joints, use of target muscles after min to mod verbal, visual, tactile cues.  Patient will benefit from trial recert due to limited compliance with HEP. She does make progress with short term and long term goals in regards to mobility however has limited cervical movement progressions.  If patient does not increase compliance and functional progress towards goals she will be discharged, patient educated and agreed to this plan. Prognosis continues to be guarded due to limited understanding of not pushing into pain, precautions on neck, and compliance with HEP. Patient would benefit from skilled physical therapy to improve quality of life, will continue with current POC at this time                  PT Education - 10/31/20 0814     Education Details goals, POC    Person(s) Educated Patient    Methods Explanation;Demonstration;Tactile cues;Verbal cues    Comprehension Verbalized understanding;Returned demonstration;Verbal cues required;Tactile cues required              PT Short Term Goals - 10/31/20 0815       PT SHORT TERM GOAL #1   Title After 4 weeks pt will reports less fatigue in her neck, report only requiring the cervical soft collar <4 days per week.    Baseline Feels need for collar most afternoons due to fatigue 5/12: wears 2 days a week 6/21: only wears occasionally    Time 4     Period Weeks    Status Achieved    Target Date 09/14/20      PT SHORT TERM GOAL #2   Title After 4 weeks pt to demonstrate improved 5xSTS <12 seconds and without LOB.    Baseline 13.67sec c LOB on 2nd rep of 6 5/12: 13.08 no LOB 6/21: 12.65 seconds    Time 4    Period Weeks    Status Achieved    Target Date 09/14/20      PT SHORT TERM GOAL #3   Title After 4 weeks pt to demonstrate improve lumbopelvic motor control AEB ability to ATTEMPT SLS multiple times without onset cramping in legs.  Baseline has severe cramping limitations in hamstrings during testing on eval 6/21: able to perform    Time 4    Period Weeks    Status Achieved    Target Date 09/14/20               PT Long Term Goals - 10/31/20 4098       PT LONG TERM GOAL #1   Title After 6 weeks pt to score >80 on FOTO survey to demonstrate improved participation in life.    Baseline 77 at eval 5/12: 69% 6/21: 85%    Time 8    Period Weeks    Status Achieved      PT LONG TERM GOAL #2   Title After 8 weeks pt to demonstrate 5xSTS in <12sec eyes closed without LOB to demonstrate improved dynamic motor control.    Baseline eyes open at eval 5/12: eyes open 13.08 8/21: 12.65 seconds    Time 8    Period Weeks    Status Partially Met    Target Date 12/26/20      PT LONG TERM GOAL #3   Title After 8 weeks pt to demonstrate improved balance AEB SLS >5sec bilat without use of arms for righting upon failure thereafter.    Baseline <3 sec SLS, no use of arms for righting, but arresting hamstrings cramps bilat 6/21: L 15 seconds R 8 seconds    Time 8    Period Weeks    Status Achieved      PT LONG TERM GOAL #4   Title Patient will reduce Neck Disability Index score to <10% to demonstrate minimal disability with ADL's including improved sleeping tolerance, sitting tolerance, etc for better mobility at home and work.    Baseline 6/21: 21%    Time 8    Period Weeks    Status New    Target Date 12/26/20      PT LONG  TERM GOAL #5   Title Patient will demonstrate increased cervical range of motion within 10degrees of normal range to increase functional mobility for iADLs such as driving.    Baseline 6/21: see note    Time 8    Period Weeks    Status New    Target Date 12/26/20      Additional Long Term Goals   Additional Long Term Goals Yes      PT LONG TERM GOAL #6   Title Patient will tolerate sitting unsupported demonstrating erect sitting posture for 10 minutes for postural strengthening and reduction of cervical range of motion and stabilization.    Baseline 6/21: unable to perform    Time 8    Period Weeks    Status New    Target Date 12/26/20                   Plan - 10/31/20 0825     Clinical Impression Statement Patient will benefit from trial recert due to limited compliance with HEP. She does make progress with short term and long term goals in regards to mobility however has limited cervical movement progressions.  If patient does not increase compliance and functional progress towards goals she will be discharged, patient educated and agreed to this plan. Prognosis continues to be guarded due to limited understanding of not pushing into pain, precautions on neck, and compliance with HEP. Patient would benefit from skilled physical therapy to improve quality of life, will continue with current POC at this time  Personal Factors and Comorbidities Age;Sex;Behavior Pattern;Comorbidity 1;Fitness;Time since onset of injury/illness/exacerbation    Comorbidities osteoporosis    Examination-Activity Limitations Bend;Bathing;Dressing;Lift;Reach Overhead;Stand;Stairs    Examination-Participation Restrictions Driving;Community Activity;Yard Work;Cleaning    Stability/Clinical Decision Making Unstable/Unpredictable    Rehab Potential Fair    PT Frequency 2x / week    PT Duration 8 weeks    PT Treatment/Interventions ADLs/Self Care Home Management;Cryotherapy;Electrical Stimulation;Moist  Heat;DME Instruction;Gait training;Stair training;Functional mobility training;Therapeutic activities;Therapeutic exercise;Balance training;Neuromuscular re-education;Patient/family education;Passive range of motion    PT Next Visit Plan Discuss plan of PT and limitations of care, particualrly regarding return to driivng. Screen right shoulder moblity/strength for lifting activity at home, cervical extension/flexion A/ROM assessent (already permitted for [employer]); Exhaustive balance screening to address 5+ years of falls and orthopedic insult.    PT Home Exercise Plan None issued at eval; to be determine.    Consulted and Agree with Plan of Care Patient             Patient will benefit from skilled therapeutic intervention in order to improve the following deficits and impairments:  Decreased balance, Abnormal gait, Decreased activity tolerance, Decreased knowledge of use of DME, Decreased knowledge of precautions, Decreased mobility, Decreased range of motion, Decreased strength, Hypomobility, Increased muscle spasms, Postural dysfunction, Impaired UE functional use  Visit Diagnosis: Abnormal head movements  Repeated falls  Muscle weakness (generalized)     Problem List Patient Active Problem List   Diagnosis Date Noted   Status post reverse total shoulder replacement, right 09/24/2018   S/P right colectomy 01/26/2018   Adenomatous polyp of ascending colon 11/24/2017   Generalized anxiety disorder 09/06/2017   Irritable bowel syndrome (IBS) 09/06/2017   Fracture of one rib, right side, subsequent encounter for fracture with routine healing 03/08/2017   Orthostasis 06/05/2016   Left shoulder pain 10/13/2015   Constipation 05/30/2015   Osteoporosis, postmenopausal 05/30/2015   Compression fracture of L2 lumbar vertebra with delayed healing 05/29/2015   Frequent falls 03/26/2015   Atherosclerosis of arteries 03/26/2015   Chest pain 02/09/2015   Breast cancer screening  10/01/2014   Family history of colon cancer 09/28/2014   Viral URI with cough 06/12/2014   Tenesmus (rectal) 06/12/2014   TMJ (temporomandibular joint syndrome) 06/12/2014   Varicose veins of both lower extremities without ulcer or inflammation 06/12/2014   B12 deficiency 09/20/2013   Fatigue 05/23/2013   Internal hemorrhoids without complication 02/08/5746   Insomnia 03/03/2013   Vertigo due to cerebrovascular disease 03/03/2013   Seronegative rheumatoid arthritis affecting lower leg ([place]) 03/03/2013   Janna Arch, PT, DPT   10/31/2020, 8:57 AM  [place] MAIN [facility] SERVICES [address] [place], [place], [address] Phone: [phone]   Fax:  [phone]  Name: Teresa Lin MRN: [id number] Date of Birth: [birth year]

## 2020-11-01 ENCOUNTER — Encounter: Payer: Medicare Other | Admitting: Speech Pathology

## 2020-11-02 ENCOUNTER — Other Ambulatory Visit: Payer: Self-pay

## 2020-11-02 ENCOUNTER — Ambulatory Visit: Payer: Medicare Other

## 2020-11-02 DIAGNOSIS — R296 Repeated falls: Secondary | ICD-10-CM

## 2020-11-02 DIAGNOSIS — M6281 Muscle weakness (generalized): Secondary | ICD-10-CM

## 2020-11-02 DIAGNOSIS — R250 Abnormal head movements: Secondary | ICD-10-CM

## 2020-11-02 NOTE — Therapy (Signed)
[place] MAIN [facility] SERVICES [address] [place], [place], [address] Phone: [phone]   Fax:  [phone]  Physical Therapy Treatment  Patient Details  Name: Teresa Lin MRN: [id number] Date of Birth: Oct 22, 1931 Referring Provider (PT): Berwind Shah,MD (Neurology)   Encounter Date: 11/02/2020   PT End of Session - 11/02/20 0808     Visit Number 18    Number of Visits 33    Date for PT Re-Evaluation 12/26/20    Authorization Type Medicare Parts A and B;  8/10 PN 09/21/20    Authorization Time Period 08/17/20-11/09/20    PT Start Time 0802    PT Stop Time 5885    PT Time Calculation (min) 42 min    Activity Tolerance Patient tolerated treatment well;No increased pain;Patient limited by fatigue    Behavior During Therapy [facility] for tasks assessed/performed             Past Medical History:  Diagnosis Date   Anemia    Anxiety    Arthritis    osteoarthritis. Bilateral knee replacement, hands, meniscal tear, cervical disc disease   Atherosclerosis    Atypical chest pain    Collagen vascular disease (HCC)    Colon polyp 12/15/2017   Colon polyps    Compression fracture of L2 (HCC)    Diverticulosis    DVT (deep venous thrombosis) (HCC)    GERD (gastroesophageal reflux disease)    History of seronegative inflammatory arthritis    Hypertension    IBS (irritable bowel syndrome)    Internal hemorrhoids    Phlebitis and thrombophlebitis of deep veins of lower extremities ([place])    after surgery   Vision abnormalities     Past Surgical History:  Procedure Laterality Date   APPENDECTOMY     COLONOSCOPY N/A 01/24/2020   Procedure: COLONOSCOPY;  Surgeon: Lesly Rubenstein, MD;  Location: [facility] ENDOSCOPY;  Service: Endoscopy;  Laterality: N/A;   COLONOSCOPY WITH PROPOFOL N/A 10/24/2017   Procedure: COLONOSCOPY WITH PROPOFOL;  Surgeon: Manya Silvas, MD;  Location: [facility] ENDOSCOPY;  Service: Endoscopy;  Laterality: N/A;   COLONOSCOPY  WITH PROPOFOL N/A 11/21/2017   Procedure: COLONOSCOPY WITH PROPOFOL;  Surgeon: Manya Silvas, MD;  Location: [facility] ENDOSCOPY;  Service: Endoscopy;  Laterality: N/A;   DILATION AND CURETTAGE OF UTERUS     ESOPHAGOGASTRODUODENOSCOPY (EGD) WITH PROPOFOL N/A 02/25/2018   Procedure: ESOPHAGOGASTRODUODENOSCOPY (EGD) WITH PROPOFOL;  Surgeon: Manya Silvas, MD;  Location: [facility] ENDOSCOPY;  Service: Endoscopy;  Laterality: N/A;   EYE SURGERY     bilateral catarACT   JOINT REPLACEMENT Bilateral    knee replacement   LAPAROSCOPIC RIGHT COLECTOMY Right 12/15/2017   Procedure: LAPAROSCOPIC RIGHT COLECTOMY;  Surgeon: Robert Bellow, MD;  Location: [facility] ORS;  Service: General;  Laterality: Right;   REPLACEMENT TOTAL KNEE BILATERAL     TOTAL SHOULDER ARTHROPLASTY Right 09/24/2018   Procedure: TOTAL SHOULDER ARTHROPLASTY - REVERSE RIGHT;  Surgeon: Corky Mull, MD;  Location: [facility] ORS;  Service: Orthopedics;  Laterality: Right;    There were no vitals filed for this visit.   Subjective Assessment - 11/02/20 0806     Subjective Patient reports she has done her HEP but wants to review them to make sure she is doing it correctly. Remembered to do her balance exercise.    Pertinent History HPI    Limitations Reading    How long can you sit comfortably? Neck does not limit.    How long can  you stand comfortably? Neck does not limit.    How long can you walk comfortably? Can sometimes be sore, but not limited.    Currently in Pain? No/denies                Treatment:   therex Seated: Cervical rotation slow controlled movements 10x each side 5 second holds, cue for slowing down motion Cervical side bend to same side 10x; max cueing for keeping head straight and sidebend only; 5 second holds Cervical extension and flexion with cues for pain free range of motion 10x 5 second holds RTB rows 10x   Supine:  Cervical extension and chin tuck 12x with holds   Standing: Wall posture with  scapular retraction and depression 10x 5 second holds PVC pipe overhead raise 10x Snow angel wall abduction 10x cues for neutral alignment  manual: STM to cervical paraspinals and upper trap with implementation of effleurage and ptrissage x 9 minutes Supine: Cervical rotation with overpressure at glenohumeral joint 4x 45 second holds Suboccipital release 3x45 seconds     Pt educated throughout session about proper posture and technique with exercises. Improved exercise technique, movement at target joints, use of target muscles after min to mod verbal, visual, tactile cues.     Patient requires education on HEP, requires PT to go through entire program yet again; despite being done nearly every session. Patient is aware she is on trial period based on compliance with HEP. Patient would benefit from skilled physical therapy to improve quality of life, will continue with current POC at this time                  PT Education - 11/02/20 0807     Education Details exercise technique, body mechanics    Person(s) Educated Patient    Methods Explanation;Demonstration;Tactile cues;Verbal cues    Comprehension Verbalized understanding;Returned demonstration;Verbal cues required;Tactile cues required              PT Short Term Goals - 10/31/20 0815       PT SHORT TERM GOAL #1   Title After 4 weeks pt will reports less fatigue in her neck, report only requiring the cervical soft collar <4 days per week.    Baseline Feels need for collar most afternoons due to fatigue 5/12: wears 2 days a week 6/21: only wears occasionally    Time 4    Period Weeks    Status Achieved    Target Date 09/14/20      PT SHORT TERM GOAL #2   Title After 4 weeks pt to demonstrate improved 5xSTS <12 seconds and without LOB.    Baseline 13.67sec c LOB on 2nd rep of 6 5/12: 13.08 no LOB 6/21: 12.65 seconds    Time 4    Period Weeks    Status Achieved    Target Date 09/14/20      PT SHORT  TERM GOAL #3   Title After 4 weeks pt to demonstrate improve lumbopelvic motor control AEB ability to ATTEMPT SLS multiple times without onset cramping in legs.    Baseline has severe cramping limitations in hamstrings during testing on eval 6/21: able to perform    Time 4    Period Weeks    Status Achieved    Target Date 09/14/20               PT Long Term Goals - 10/31/20 0823       PT LONG TERM GOAL #1  Title After 6 weeks pt to score >80 on FOTO survey to demonstrate improved participation in life.    Baseline 77 at eval 5/12: 69% 6/21: 85%    Time 8    Period Weeks    Status Achieved      PT LONG TERM GOAL #2   Title After 8 weeks pt to demonstrate 5xSTS in <12sec eyes closed without LOB to demonstrate improved dynamic motor control.    Baseline eyes open at eval 5/12: eyes open 13.08 8/21: 12.65 seconds    Time 8    Period Weeks    Status Partially Met    Target Date 12/26/20      PT LONG TERM GOAL #3   Title After 8 weeks pt to demonstrate improved balance AEB SLS >5sec bilat without use of arms for righting upon failure thereafter.    Baseline <3 sec SLS, no use of arms for righting, but arresting hamstrings cramps bilat 6/21: L 15 seconds R 8 seconds    Time 8    Period Weeks    Status Achieved      PT LONG TERM GOAL #4   Title Patient will reduce Neck Disability Index score to <10% to demonstrate minimal disability with ADL's including improved sleeping tolerance, sitting tolerance, etc for better mobility at home and work.    Baseline 6/21: 21%    Time 8    Period Weeks    Status New    Target Date 12/26/20      PT LONG TERM GOAL #5   Title Patient will demonstrate increased cervical range of motion within 10degrees of normal range to increase functional mobility for iADLs such as driving.    Baseline 6/21: see note    Time 8    Period Weeks    Status New    Target Date 12/26/20      Additional Long Term Goals   Additional Long Term Goals Yes       PT LONG TERM GOAL #6   Title Patient will tolerate sitting unsupported demonstrating erect sitting posture for 10 minutes for postural strengthening and reduction of cervical range of motion and stabilization.    Baseline 6/21: unable to perform    Time 8    Period Weeks    Status New    Target Date 12/26/20                   Plan - 11/02/20 1333     Clinical Impression Statement Patient requires education on HEP, requires PT to go through entire program yet again; despite being done nearly every session. Patient is aware she is on trial period based on compliance with HEP. Patient would benefit from skilled physical therapy to improve quality of life, will continue with current POC at this time    Personal Factors and Comorbidities Age;Sex;Behavior Pattern;Comorbidity 1;Fitness;Time since onset of injury/illness/exacerbation    Comorbidities osteoporosis    Examination-Activity Limitations Bend;Bathing;Dressing;Lift;Reach Overhead;Stand;Stairs    Examination-Participation Restrictions Driving;Community Activity;Yard Work;Cleaning    Stability/Clinical Decision Making Unstable/Unpredictable    Rehab Potential Fair    PT Frequency 2x / week    PT Duration 8 weeks    PT Treatment/Interventions ADLs/Self Care Home Management;Cryotherapy;Electrical Stimulation;Moist Heat;DME Instruction;Gait training;Stair training;Functional mobility training;Therapeutic activities;Therapeutic exercise;Balance training;Neuromuscular re-education;Patient/family education;Passive range of motion    PT Next Visit Plan Discuss plan of PT and limitations of care, particualrly regarding return to driivng. Screen right shoulder moblity/strength for lifting activity at home, cervical extension/flexion  A/ROM assessent (already permitted for [employer]); Exhaustive balance screening to address 5+ years of falls and orthopedic insult.    PT Home Exercise Plan None issued at eval; to be determine.     Consulted and Agree with Plan of Care Patient             Patient will benefit from skilled therapeutic intervention in order to improve the following deficits and impairments:  Decreased balance, Abnormal gait, Decreased activity tolerance, Decreased knowledge of use of DME, Decreased knowledge of precautions, Decreased mobility, Decreased range of motion, Decreased strength, Hypomobility, Increased muscle spasms, Postural dysfunction, Impaired UE functional use  Visit Diagnosis: Abnormal head movements  Repeated falls  Muscle weakness (generalized)     Problem List Patient Active Problem List   Diagnosis Date Noted   Status post reverse total shoulder replacement, right 09/24/2018   S/P right colectomy 01/26/2018   Adenomatous polyp of ascending colon 11/24/2017   Generalized anxiety disorder 09/06/2017   Irritable bowel syndrome (IBS) 09/06/2017   Fracture of one rib, right side, subsequent encounter for fracture with routine healing 03/08/2017   Orthostasis 06/05/2016   Left shoulder pain 10/13/2015   Constipation 05/30/2015   Osteoporosis, postmenopausal 05/30/2015   Compression fracture of L2 lumbar vertebra with delayed healing 05/29/2015   Frequent falls 03/26/2015   Atherosclerosis of arteries 03/26/2015   Chest pain 02/09/2015   Breast cancer screening 10/01/2014   Family history of colon cancer 09/28/2014   Viral URI with cough 06/12/2014   Tenesmus (rectal) 06/12/2014   TMJ (temporomandibular joint syndrome) 06/12/2014   Varicose veins of both lower extremities without ulcer or inflammation 06/12/2014   B12 deficiency 09/20/2013   Fatigue 05/23/2013   Internal hemorrhoids without complication 20/25/4270   Insomnia 03/03/2013   Vertigo due to cerebrovascular disease 03/03/2013   Seronegative rheumatoid arthritis affecting lower leg ([place]) 03/03/2013   Janna Arch, PT, DPT   11/02/2020, 1:36 PM  [place] [facility] MAIN [facility]  SERVICES [address] [place], [place], [address] Phone: [phone]   Fax:  [phone]  Name: Teresa Lin MRN: [id number] Date of Birth: 1932/02/07

## 2020-11-06 ENCOUNTER — Encounter: Payer: Medicare Other | Admitting: Speech Pathology

## 2020-11-06 ENCOUNTER — Ambulatory Visit: Payer: Medicare Other

## 2020-11-07 ENCOUNTER — Other Ambulatory Visit: Payer: Self-pay

## 2020-11-07 ENCOUNTER — Ambulatory Visit: Payer: Medicare Other

## 2020-11-07 DIAGNOSIS — M6281 Muscle weakness (generalized): Secondary | ICD-10-CM

## 2020-11-07 DIAGNOSIS — R250 Abnormal head movements: Secondary | ICD-10-CM

## 2020-11-07 DIAGNOSIS — R296 Repeated falls: Secondary | ICD-10-CM

## 2020-11-07 NOTE — Therapy (Signed)
[place] MAIN [facility] SERVICES [address] [place], [place], [address] Phone: [phone]   Fax:  [phone]  Physical Therapy Treatment  Patient Details  Name: Teresa Lin MRN: [id number] Date of Birth: 16-Apr-1932 Referring Provider (PT): Sundance Shah,MD (Neurology)   Encounter Date: 11/07/2020   PT End of Session - 11/07/20 0811     Visit Number 19    Number of Visits 33    Date for PT Re-Evaluation 12/26/20    Authorization Type Medicare Parts A and B;  9/10 PN 09/21/20    Authorization Time Period 08/17/20-11/09/20    PT Start Time 0806    PT Stop Time 0845    PT Time Calculation (min) 39 min    Activity Tolerance Patient tolerated treatment well;No increased pain;Patient limited by fatigue    Behavior During Therapy [facility] for tasks assessed/performed             Past Medical History:  Diagnosis Date   Anemia    Anxiety    Arthritis    osteoarthritis. Bilateral knee replacement, hands, meniscal tear, cervical disc disease   Atherosclerosis    Atypical chest pain    Collagen vascular disease (HCC)    Colon polyp 12/15/2017   Colon polyps    Compression fracture of L2 (HCC)    Diverticulosis    DVT (deep venous thrombosis) (HCC)    GERD (gastroesophageal reflux disease)    History of seronegative inflammatory arthritis    Hypertension    IBS (irritable bowel syndrome)    Internal hemorrhoids    Phlebitis and thrombophlebitis of deep veins of lower extremities ([place])    after surgery   Vision abnormalities     Past Surgical History:  Procedure Laterality Date   APPENDECTOMY     COLONOSCOPY N/A 01/24/2020   Procedure: COLONOSCOPY;  Surgeon: Lesly Rubenstein, MD;  Location: [facility] ENDOSCOPY;  Service: Endoscopy;  Laterality: N/A;   COLONOSCOPY WITH PROPOFOL N/A 10/24/2017   Procedure: COLONOSCOPY WITH PROPOFOL;  Surgeon: Manya Silvas, MD;  Location: [facility] ENDOSCOPY;  Service: Endoscopy;  Laterality: N/A;   COLONOSCOPY  WITH PROPOFOL N/A 11/21/2017   Procedure: COLONOSCOPY WITH PROPOFOL;  Surgeon: Manya Silvas, MD;  Location: [facility] ENDOSCOPY;  Service: Endoscopy;  Laterality: N/A;   DILATION AND CURETTAGE OF UTERUS     ESOPHAGOGASTRODUODENOSCOPY (EGD) WITH PROPOFOL N/A 02/25/2018   Procedure: ESOPHAGOGASTRODUODENOSCOPY (EGD) WITH PROPOFOL;  Surgeon: Manya Silvas, MD;  Location: [facility] ENDOSCOPY;  Service: Endoscopy;  Laterality: N/A;   EYE SURGERY     bilateral catarACT   JOINT REPLACEMENT Bilateral    knee replacement   LAPAROSCOPIC RIGHT COLECTOMY Right 12/15/2017   Procedure: LAPAROSCOPIC RIGHT COLECTOMY;  Surgeon: Robert Bellow, MD;  Location: [facility] ORS;  Service: General;  Laterality: Right;   REPLACEMENT TOTAL KNEE BILATERAL     TOTAL SHOULDER ARTHROPLASTY Right 09/24/2018   Procedure: TOTAL SHOULDER ARTHROPLASTY - REVERSE RIGHT;  Surgeon: Corky Mull, MD;  Location: [facility] ORS;  Service: Orthopedics;  Laterality: Right;    There were no vitals filed for this visit.   Subjective Assessment - 11/07/20 0812     Subjective Patient reports she has been having neck pain, not sure if she has been doing her HEP correctly.    Pertinent History HPI    Limitations Reading    How long can you sit comfortably? Neck does not limit.    How long can you stand comfortably? Neck does not limit.  How long can you walk comfortably? Can sometimes be sore, but not limited.    Currently in Pain? No/denies                 Treatment:   therex Seated: Cervical rotation slow controlled movements 10x each side 5 second holds, cue for slowing down motion Cervical side bend to same side 10x; max cueing for keeping head straight and sidebend only; 5 second holds; cues to not push into pain.  Cervical extension and flexion with cues for pain free range of motion 10x 5 second holds 5lb row    Supine: Cervical extension and chin tuck 12x with holds 5lb overhead raise 10x 5lb chest press 12x; cues for  sequencing and body mechanics  Standing: Wall posture with scapular retraction and depression 10x 5 second holds PVC pipe overhead raise 10x Snow angel wall abduction 10x cues for neutral alignment   manual: STM to cervical paraspinals and upper trap with implementation of effleurage and ptrissage x 9 minutes Supine: Cervical rotation with overpressure at glenohumeral joint 4x 45 second holds Suboccipital release 3x45 seconds     Pt educated throughout session about proper posture and technique with exercises. Improved exercise technique, movement at target joints, use of target muscles after min to mod verbal, visual, tactile cues.         Patient continues to require education on holding stretch to reduce pain in cervical region. Patient is still unable to perform side bend without extensive cueing indicating poor compliance/performance of HEP. Patient continues to require extensive re-education despite it being performed every session on pain levels, activity, speed of movement, HEP compliance. Patient would benefit from skilled physical therapy to improve quality of life, will continue with current POC at this time                      PT Education - 11/07/20 0812     Education Details holding stretch, HEP performance    Person(s) Educated Patient    Methods Explanation    Comprehension Verbalized understanding              PT Short Term Goals - 10/31/20 0815       PT SHORT TERM GOAL #1   Title After 4 weeks pt will reports less fatigue in her neck, report only requiring the cervical soft collar <4 days per week.    Baseline Feels need for collar most afternoons due to fatigue 5/12: wears 2 days a week 6/21: only wears occasionally    Time 4    Period Weeks    Status Achieved    Target Date 09/14/20      PT SHORT TERM GOAL #2   Title After 4 weeks pt to demonstrate improved 5xSTS <12 seconds and without LOB.    Baseline 13.67sec c LOB on 2nd rep  of 6 5/12: 13.08 no LOB 6/21: 12.65 seconds    Time 4    Period Weeks    Status Achieved    Target Date 09/14/20      PT SHORT TERM GOAL #3   Title After 4 weeks pt to demonstrate improve lumbopelvic motor control AEB ability to ATTEMPT SLS multiple times without onset cramping in legs.    Baseline has severe cramping limitations in hamstrings during testing on eval 6/21: able to perform    Time 4    Period Weeks    Status Achieved    Target Date 09/14/20  PT Long Term Goals - 10/31/20 1610       PT LONG TERM GOAL #1   Title After 6 weeks pt to score >80 on FOTO survey to demonstrate improved participation in life.    Baseline 77 at eval 5/12: 69% 6/21: 85%    Time 8    Period Weeks    Status Achieved      PT LONG TERM GOAL #2   Title After 8 weeks pt to demonstrate 5xSTS in <12sec eyes closed without LOB to demonstrate improved dynamic motor control.    Baseline eyes open at eval 5/12: eyes open 13.08 8/21: 12.65 seconds    Time 8    Period Weeks    Status Partially Met    Target Date 12/26/20      PT LONG TERM GOAL #3   Title After 8 weeks pt to demonstrate improved balance AEB SLS >5sec bilat without use of arms for righting upon failure thereafter.    Baseline <3 sec SLS, no use of arms for righting, but arresting hamstrings cramps bilat 6/21: L 15 seconds R 8 seconds    Time 8    Period Weeks    Status Achieved      PT LONG TERM GOAL #4   Title Patient will reduce Neck Disability Index score to <10% to demonstrate minimal disability with ADL's including improved sleeping tolerance, sitting tolerance, etc for better mobility at home and work.    Baseline 6/21: 21%    Time 8    Period Weeks    Status New    Target Date 12/26/20      PT LONG TERM GOAL #5   Title Patient will demonstrate increased cervical range of motion within 10degrees of normal range to increase functional mobility for iADLs such as driving.    Baseline 6/21: see note    Time  8    Period Weeks    Status New    Target Date 12/26/20      Additional Long Term Goals   Additional Long Term Goals Yes      PT LONG TERM GOAL #6   Title Patient will tolerate sitting unsupported demonstrating erect sitting posture for 10 minutes for postural strengthening and reduction of cervical range of motion and stabilization.    Baseline 6/21: unable to perform    Time 8    Period Weeks    Status New    Target Date 12/26/20                   Plan - 11/07/20 0825     Clinical Impression Statement Patient continues to require education on holding stretch to reduce pain in cervical region. Patient is still unable to perform side bend without extensive cueing indicating poor compliance/performance of HEP. Patient continues to require extensive re-education despite it being performed every session on pain levels, activity, speed of movement, HEP compliance. Patient would benefit from skilled physical therapy to improve quality of life, will continue with current POC at this time    Personal Factors and Comorbidities Age;Sex;Behavior Pattern;Comorbidity 1;Fitness;Time since onset of injury/illness/exacerbation    Comorbidities osteoporosis    Examination-Activity Limitations Bend;Bathing;Dressing;Lift;Reach Overhead;Stand;Stairs    Examination-Participation Restrictions Driving;Community Activity;Yard Work;Cleaning    Stability/Clinical Decision Making Unstable/Unpredictable    Rehab Potential Fair    PT Frequency 2x / week    PT Duration 8 weeks    PT Treatment/Interventions ADLs/Self Care Home Management;Cryotherapy;Electrical Stimulation;Moist Heat;DME Instruction;Gait training;Stair training;Functional mobility training;Therapeutic activities;Therapeutic  exercise;Balance training;Neuromuscular re-education;Patient/family education;Passive range of motion    PT Next Visit Plan Discuss plan of PT and limitations of care, particualrly regarding return to driivng. Screen  right shoulder moblity/strength for lifting activity at home, cervical extension/flexion A/ROM assessent (already permitted for [employer]); Exhaustive balance screening to address 5+ years of falls and orthopedic insult.    PT Home Exercise Plan None issued at eval; to be determine.    Consulted and Agree with Plan of Care Patient             Patient will benefit from skilled therapeutic intervention in order to improve the following deficits and impairments:  Decreased balance, Abnormal gait, Decreased activity tolerance, Decreased knowledge of use of DME, Decreased knowledge of precautions, Decreased mobility, Decreased range of motion, Decreased strength, Hypomobility, Increased muscle spasms, Postural dysfunction, Impaired UE functional use  Visit Diagnosis: Abnormal head movements  Repeated falls  Muscle weakness (generalized)     Problem List Patient Active Problem List   Diagnosis Date Noted   Status post reverse total shoulder replacement, right 09/24/2018   S/P right colectomy 01/26/2018   Adenomatous polyp of ascending colon 11/24/2017   Generalized anxiety disorder 09/06/2017   Irritable bowel syndrome (IBS) 09/06/2017   Fracture of one rib, right side, subsequent encounter for fracture with routine healing 03/08/2017   Orthostasis 06/05/2016   Left shoulder pain 10/13/2015   Constipation 05/30/2015   Osteoporosis, postmenopausal 05/30/2015   Compression fracture of L2 lumbar vertebra with delayed healing 05/29/2015   Frequent falls 03/26/2015   Atherosclerosis of arteries 03/26/2015   Chest pain 02/09/2015   Breast cancer screening 10/01/2014   Family history of colon cancer 09/28/2014   Viral URI with cough 06/12/2014   Tenesmus (rectal) 06/12/2014   TMJ (temporomandibular joint syndrome) 06/12/2014   Varicose veins of both lower extremities without ulcer or inflammation 06/12/2014   B12 deficiency 09/20/2013   Fatigue 05/23/2013   Internal hemorrhoids  without complication 08/81/1031   Insomnia 03/03/2013   Vertigo due to cerebrovascular disease 03/03/2013   Seronegative rheumatoid arthritis affecting lower leg ([place]) 03/03/2013    Janna Arch, PT, DPT  11/07/2020, 8:45 AM  [place] MAIN [facility] SERVICES [address] [place], [place], [address] Phone: [phone]   Fax:  [phone]  Name: Teresa Lin MRN: [id number] Date of Birth: [birth year]

## 2020-11-08 ENCOUNTER — Encounter: Payer: Medicare Other | Admitting: Speech Pathology

## 2020-11-09 ENCOUNTER — Ambulatory Visit: Payer: Medicare Other

## 2020-11-09 ENCOUNTER — Other Ambulatory Visit: Payer: Self-pay

## 2020-11-09 DIAGNOSIS — R296 Repeated falls: Secondary | ICD-10-CM

## 2020-11-09 DIAGNOSIS — R250 Abnormal head movements: Secondary | ICD-10-CM

## 2020-11-09 DIAGNOSIS — M6281 Muscle weakness (generalized): Secondary | ICD-10-CM

## 2020-11-09 NOTE — Therapy (Signed)
[place] [facility] SERVICES [address] [place], [place], [address] Phone: [phone]   Fax:  [phone]  Physical Therapy Treatment Physical Therapy Progress Note   Dates of reporting period  09/21/20   to   11/09/20   Patient Details  Name: Teresa Lin MRN: [id number] Date of Birth: 04-28-1932 Referring Provider (PT): Vernon Shah,MD (Neurology)   Encounter Date: 11/09/2020   PT End of Session - 11/09/20 0809     Visit Number 20    Number of Visits 33    Date for PT Re-Evaluation 12/26/20    Authorization Type Medicare Parts A and B;  10/10 PN 09/21/20; next session 1/10 PN 6/30    Authorization Time Period 08/17/20-11/09/20    PT Start Time 0803    PT Stop Time 8325    PT Time Calculation (min) 41 min    Activity Tolerance Patient tolerated treatment well;No increased pain;Patient limited by fatigue    Behavior During Therapy [facility] for tasks assessed/performed             Past Medical History:  Diagnosis Date   Anemia    Anxiety    Arthritis    osteoarthritis. Bilateral knee replacement, hands, meniscal tear, cervical disc disease   Atherosclerosis    Atypical chest pain    Collagen vascular disease (HCC)    Colon polyp 12/15/2017   Colon polyps    Compression fracture of L2 (HCC)    Diverticulosis    DVT (deep venous thrombosis) (HCC)    GERD (gastroesophageal reflux disease)    History of seronegative inflammatory arthritis    Hypertension    IBS (irritable bowel syndrome)    Internal hemorrhoids    Phlebitis and thrombophlebitis of deep veins of lower extremities ([place])    after surgery   Vision abnormalities     Past Surgical History:  Procedure Laterality Date   APPENDECTOMY     COLONOSCOPY N/A 01/24/2020   Procedure: COLONOSCOPY;  Surgeon: Lesly Rubenstein, MD;  Location: [facility] ENDOSCOPY;  Service: Endoscopy;  Laterality: N/A;   COLONOSCOPY WITH PROPOFOL N/A 10/24/2017   Procedure: COLONOSCOPY WITH PROPOFOL;   Surgeon: Manya Silvas, MD;  Location[facility] ENDOSCOPY;  Service: Endoscopy;  Laterality: N/A;   COLONOSCOPY WITH PROPOFOL N/A 11/21/2017   Procedure: COLONOSCOPY WITH PROPOFOL;  Surgeon: Manya Silvas, MD;  Location: [facility] ENDOSCOPY;  Service: Endoscopy;  Laterality: N/A;   DILATION AND CURETTAGE OF UTERUS     ESOPHAGOGASTRODUODENOSCOPY (EGD) WITH PROPOFOL N/A 02/25/2018   Procedure: ESOPHAGOGASTRODUODENOSCOPY (EGD) WITH PROPOFOL;  Surgeon: Manya Silvas, MD;  Location: [facility] ENDOSCOPY;  Service: Endoscopy;  Laterality: N/A;   EYE SURGERY     bilateral catarACT   JOINT REPLACEMENT Bilateral    knee replacement   LAPAROSCOPIC RIGHT COLECTOMY Right 12/15/2017   Procedure: LAPAROSCOPIC RIGHT COLECTOMY;  Surgeon: Robert Bellow, MD;  Location: [facility] ORS;  Service: General;  Laterality: Right;   REPLACEMENT TOTAL KNEE BILATERAL     TOTAL SHOULDER ARTHROPLASTY Right 09/24/2018   Procedure: TOTAL SHOULDER ARTHROPLASTY - REVERSE RIGHT;  Surgeon: Corky Mull, MD;  Location: [facility] ORS;  Service: Orthopedics;  Laterality: Right;    There were no vitals filed for this visit.   Subjective Assessment - 11/09/20 0808     Subjective Patient presents wearing neck brace, wanted to wear it due to feeling unstable today. Has done her HEP but is unsure she is doing them correctly.    Pertinent History HPI  Limitations Reading    How long can you sit comfortably? Neck does not limit.    How long can you stand comfortably? Neck does not limit.    How long can you walk comfortably? Can sometimes be sore, but not limited.    Currently in Pain? No/denies                   NDI: 14%  FOTO: 83%  Cervical range of motion :     Right Left  Flexion 35  Extension 20  Side Bending 10 10  Rotation 25 30    Sitting posture : slight rounded shoulders forward head    Treatment:   therex Seated: Cervical rotation slow controlled movements 10x each side 5 second holds, cue for slowing  down motion Cervical side bend to same side 10x; max cueing for keeping head straight and sidebend only; 5 second holds; cues to not push into pain.  Cervical extension and flexion with cues for pain free range of motion 10x 5 second holds 5lb row    Supine: Cervical extension and chin tuck 12x with holds   Standing: Wall posture with scapular retraction and depression 10x 5 second holds Cervical rotation 10x with scapular retraction, very challenging for patient.  Snow angel wall abduction 10x cues for neutral alignment   manual: STM to cervical paraspinals and upper trap with implementation of effleurage and ptrissage x 9 minutes Supine: Cervical rotation with overpressure at glenohumeral joint 4x 45 second holds Suboccipital release 3x45 seconds     Pt educated throughout session about proper posture and technique with exercises. Improved exercise technique, movement at target joints, use of target muscles after min to mod verbal, visual, tactile cues.      Patient's condition has the potential to improve in response to therapy. Maximum improvement is yet to be obtained. The anticipated improvement is attainable and reasonable in a generally predictable time.  Patient reports she is unsure she is doing her HEP correctly despite education every session.     Although patient's NDI and FOTO score have improved her range of motion has not. Patient's rotation and side bending has decreased but her extension has improved. Patient re-educated on importance of HEP compliance with holding of stretch for optimal carryover. Educated that if she does not have compliance and improved range within a reasonable amount of time she will be discharged due to noncompliance. Patient's condition has the potential to improve in response to therapy. Maximum improvement is yet to be obtained. The anticipated improvement is attainable and reasonable in a generally predictable time. Patient would benefit from  skilled physical therapy to improve quality of life, will continue with current POC at this time      PT Education - 11/09/20 0809     Education Details progress note    Person(s) Educated Patient    Methods Explanation;Demonstration;Tactile cues;Verbal cues    Comprehension Verbalized understanding;Returned demonstration;Verbal cues required;Tactile cues required              PT Short Term Goals - 10/31/20 0815       PT SHORT TERM GOAL #1   Title After 4 weeks pt will reports less fatigue in her neck, report only requiring the cervical soft collar <4 days per week.    Baseline Feels need for collar most afternoons due to fatigue 5/12: wears 2 days a week 6/21: only wears occasionally    Time 4    Period Weeks  Status Achieved    Target Date 09/14/20      PT SHORT TERM GOAL #2   Title After 4 weeks pt to demonstrate improved 5xSTS <12 seconds and without LOB.    Baseline 13.67sec c LOB on 2nd rep of 6 5/12: 13.08 no LOB 6/21: 12.65 seconds    Time 4    Period Weeks    Status Achieved    Target Date 09/14/20      PT SHORT TERM GOAL #3   Title After 4 weeks pt to demonstrate improve lumbopelvic motor control AEB ability to ATTEMPT SLS multiple times without onset cramping in legs.    Baseline has severe cramping limitations in hamstrings during testing on eval 6/21: able to perform    Time 4    Period Weeks    Status Achieved    Target Date 09/14/20               PT Long Term Goals - 11/09/20 1357       PT LONG TERM GOAL #1   Title After 6 weeks pt to score >80 on FOTO survey to demonstrate improved participation in life.    Baseline 77 at eval 5/12: 69% 6/21: 85% 6/30: 83%    Time 8    Period Weeks    Status Achieved      PT LONG TERM GOAL #2   Title After 8 weeks pt to demonstrate 5xSTS in <12sec eyes closed without LOB to demonstrate improved dynamic motor control.    Baseline eyes open at eval 5/12: eyes open 13.08 6/21: 12.65 seconds    Time 8     Period Weeks    Status Partially Met    Target Date 12/26/20      PT LONG TERM GOAL #3   Title After 8 weeks pt to demonstrate improved balance AEB SLS >5sec bilat without use of arms for righting upon failure thereafter.    Baseline <3 sec SLS, no use of arms for righting, but arresting hamstrings cramps bilat 6/21: L 15 seconds R 8 seconds    Time 8    Period Weeks    Status Achieved      PT LONG TERM GOAL #4   Title Patient will reduce Neck Disability Index score to <10% to demonstrate minimal disability with ADL's including improved sleeping tolerance, sitting tolerance, etc for better mobility at home and work.    Baseline 6/21: 21% 6/30: 14%    Time 8    Period Weeks    Status Partially Met    Target Date 12/26/20      PT LONG TERM GOAL #5   Title Patient will demonstrate increased cervical range of motion within 10degrees of normal range to increase functional mobility for iADLs such as driving.    Baseline 6/21: see note 6/30: see note: decrease in rotation and sidebending noted    Time 8    Period Weeks    Status On-going    Target Date 12/26/20      PT LONG TERM GOAL #6   Title Patient will tolerate sitting unsupported demonstrating erect sitting posture for 10 minutes for postural strengthening and reduction of cervical range of motion and stabilization.    Baseline 6/21: unable to perform 6/30: slight forward head rounded shoulders posturing    Time 8    Period Weeks    Status Partially Met    Target Date 12/26/20  Plan - 11/09/20 1357     Clinical Impression Statement Although patient's NDI and FOTO score have improved her range of motion has not. Patient's rotation and side bending has decreased but her extension has improved. Patient re-educated on importance of HEP compliance with holding of stretch for optimal carryover. Educated that if she does not have compliance and improved range within a reasonable amount of time she will be  discharged due to noncompliance. Patient's condition has the potential to improve in response to therapy. Maximum improvement is yet to be obtained. The anticipated improvement is attainable and reasonable in a generally predictable time. Patient would benefit from skilled physical therapy to improve quality of life, will continue with current POC at this time    Personal Factors and Comorbidities Age;Sex;Behavior Pattern;Comorbidity 1;Fitness;Time since onset of injury/illness/exacerbation    Comorbidities osteoporosis    Examination-Activity Limitations Bend;Bathing;Dressing;Lift;Reach Overhead;Stand;Stairs    Examination-Participation Restrictions Driving;Community Activity;Yard Work;Cleaning    Stability/Clinical Decision Making Unstable/Unpredictable    Rehab Potential Fair    PT Frequency 2x / week    PT Duration 8 weeks    PT Treatment/Interventions ADLs/Self Care Home Management;Cryotherapy;Electrical Stimulation;Moist Heat;DME Instruction;Gait training;Stair training;Functional mobility training;Therapeutic activities;Therapeutic exercise;Balance training;Neuromuscular re-education;Patient/family education;Passive range of motion    PT Next Visit Plan Discuss plan of PT and limitations of care, particualrly regarding return to driivng. Screen right shoulder moblity/strength for lifting activity at home, cervical extension/flexion A/ROM assessent (already permitted for [employer]); Exhaustive balance screening to address 5+ years of falls and orthopedic insult.    PT Home Exercise Plan None issued at eval; to be determine.    Consulted and Agree with Plan of Care Patient             Patient will benefit from skilled therapeutic intervention in order to improve the following deficits and impairments:  Decreased balance, Abnormal gait, Decreased activity tolerance, Decreased knowledge of use of DME, Decreased knowledge of precautions, Decreased mobility, Decreased range of motion,  Decreased strength, Hypomobility, Increased muscle spasms, Postural dysfunction, Impaired UE functional use  Visit Diagnosis: Abnormal head movements  Repeated falls  Muscle weakness (generalized)     Problem List Patient Active Problem List   Diagnosis Date Noted   Status post reverse total shoulder replacement, right 09/24/2018   S/P right colectomy 01/26/2018   Adenomatous polyp of ascending colon 11/24/2017   Generalized anxiety disorder 09/06/2017   Irritable bowel syndrome (IBS) 09/06/2017   Fracture of one rib, right side, subsequent encounter for fracture with routine healing 03/08/2017   Orthostasis 06/05/2016   Left shoulder pain 10/13/2015   Constipation 05/30/2015   Osteoporosis, postmenopausal 05/30/2015   Compression fracture of L2 lumbar vertebra with delayed healing 05/29/2015   Frequent falls 03/26/2015   Atherosclerosis of arteries 03/26/2015   Chest pain 02/09/2015   Breast cancer screening 10/01/2014   Family history of colon cancer 09/28/2014   Viral URI with cough 06/12/2014   Tenesmus (rectal) 06/12/2014   TMJ (temporomandibular joint syndrome) 06/12/2014   Varicose veins of both lower extremities without ulcer or inflammation 06/12/2014   B12 deficiency 09/20/2013   Fatigue 05/23/2013   Internal hemorrhoids without complication 15/72/6203   Insomnia 03/03/2013   Vertigo due to cerebrovascular disease 03/03/2013   Seronegative rheumatoid arthritis affecting lower leg ([place]) 03/03/2013    Janna Arch, PT, DPT  11/09/2020, 1:59 PM  [facility] SERVICES [address] [place], [place], [address] Phone: [phone]   Fax:  [phone]  Name: TARIANA MOLDOVAN MRN: [id number] Date of Birth: 05-06-1932

## 2020-11-14 ENCOUNTER — Other Ambulatory Visit: Payer: Self-pay

## 2020-11-14 ENCOUNTER — Ambulatory Visit: Payer: Medicare Other | Attending: Internal Medicine

## 2020-11-14 DIAGNOSIS — M6281 Muscle weakness (generalized): Secondary | ICD-10-CM | POA: Diagnosis present

## 2020-11-14 DIAGNOSIS — R296 Repeated falls: Secondary | ICD-10-CM | POA: Diagnosis present

## 2020-11-14 DIAGNOSIS — R252 Cramp and spasm: Secondary | ICD-10-CM | POA: Insufficient documentation

## 2020-11-14 DIAGNOSIS — R250 Abnormal head movements: Secondary | ICD-10-CM

## 2020-11-14 DIAGNOSIS — M25631 Stiffness of right wrist, not elsewhere classified: Secondary | ICD-10-CM | POA: Insufficient documentation

## 2020-11-14 DIAGNOSIS — M25531 Pain in right wrist: Secondary | ICD-10-CM | POA: Insufficient documentation

## 2020-11-14 NOTE — Therapy (Signed)
[place] MAIN [facility] SERVICES [address] [place], [place], [address] Phone: [phone]   Fax:  [phone]  Physical Therapy Treatment  Patient Details  Name: Teresa Lin MRN: [id number] Date of Birth: 28-Nov-1931 Referring Provider (PT): Melwood Shah,MD (Neurology)   Encounter Date: 11/14/2020   PT End of Session - 11/14/20 0856     Visit Number 21    Number of Visits 33    Date for PT Re-Evaluation 12/26/20    Authorization Type Medicare Parts A and B;  1/10 PN 6/30    Authorization Time Period 08/17/20-11/09/20    PT Start Time 0847    PT Stop Time 0929    PT Time Calculation (min) 42 min    Activity Tolerance Patient tolerated treatment well;No increased pain;Patient limited by fatigue    Behavior During Therapy [facility] for tasks assessed/performed             Past Medical History:  Diagnosis Date   Anemia    Anxiety    Arthritis    osteoarthritis. Bilateral knee replacement, hands, meniscal tear, cervical disc disease   Atherosclerosis    Atypical chest pain    Collagen vascular disease (HCC)    Colon polyp 12/15/2017   Colon polyps    Compression fracture of L2 (HCC)    Diverticulosis    DVT (deep venous thrombosis) (HCC)    GERD (gastroesophageal reflux disease)    History of seronegative inflammatory arthritis    Hypertension    IBS (irritable bowel syndrome)    Internal hemorrhoids    Phlebitis and thrombophlebitis of deep veins of lower extremities ([place])    after surgery   Vision abnormalities     Past Surgical History:  Procedure Laterality Date   APPENDECTOMY     COLONOSCOPY N/A 01/24/2020   Procedure: COLONOSCOPY;  Surgeon: Lesly Rubenstein, MD;  Location: [facility] ENDOSCOPY;  Service: Endoscopy;  Laterality: N/A;   COLONOSCOPY WITH PROPOFOL N/A 10/24/2017   Procedure: COLONOSCOPY WITH PROPOFOL;  Surgeon: Manya Silvas, MD;  Location: [facility] ENDOSCOPY;  Service: Endoscopy;  Laterality: N/A;   COLONOSCOPY WITH  PROPOFOL N/A 11/21/2017   Procedure: COLONOSCOPY WITH PROPOFOL;  Surgeon: Manya Silvas, MD;  Location: [facility] ENDOSCOPY;  Service: Endoscopy;  Laterality: N/A;   DILATION AND CURETTAGE OF UTERUS     ESOPHAGOGASTRODUODENOSCOPY (EGD) WITH PROPOFOL N/A 02/25/2018   Procedure: ESOPHAGOGASTRODUODENOSCOPY (EGD) WITH PROPOFOL;  Surgeon: Manya Silvas, MD;  Location: [facility] ENDOSCOPY;  Service: Endoscopy;  Laterality: N/A;   EYE SURGERY     bilateral catarACT   JOINT REPLACEMENT Bilateral    knee replacement   LAPAROSCOPIC RIGHT COLECTOMY Right 12/15/2017   Procedure: LAPAROSCOPIC RIGHT COLECTOMY;  Surgeon: Robert Bellow, MD;  Location: [facility] ORS;  Service: General;  Laterality: Right;   REPLACEMENT TOTAL KNEE BILATERAL     TOTAL SHOULDER ARTHROPLASTY Right 09/24/2018   Procedure: TOTAL SHOULDER ARTHROPLASTY - REVERSE RIGHT;  Surgeon: Corky Mull, MD;  Location: [facility] ORS;  Service: Orthopedics;  Laterality: Right;    There were no vitals filed for this visit.   Subjective Assessment - 11/14/20 0855     Subjective Patient reports she still has some neck pain after doing HEP, doesn't think she is doing it right.    Pertinent History HPI    Limitations Reading    How long can you sit comfortably? Neck does not limit.    How long can you stand comfortably? Neck does not limit.  How long can you walk comfortably? Can sometimes be sore, but not limited.    Currently in Pain? No/denies                 Treatment:   therex Seated: Cervical rotation slow controlled movements 10x each side 5 second holds, cue for slowing down motion Cervical side bend to same side 10x; max cueing for keeping head straight and sidebend only; 5 second holds; cues to not push into pain. requires max visual and demonstrative cueing for performance  Cervical extension and flexion with cues for pain free range of motion 10x 5 second holds 5lb row 12x 3 second hold; cue for scapular squeeze    Supine: Cervical extension and chin tuck 12x with holds 5lb overhead raise 10x 5lb chest press 12x; cues for sequencing and body mechanics   Standing: Wall posture with scapular retraction and depression 10x 5 second holds Scapular retraction with horizontal head turns 10x each direction  Snow angel wall abduction 10x cues for neutral alignment   manual: STM to cervical paraspinals and upper trap with implementation of effleurage and ptrissage x 9 minutes Supine: Cervical rotation with overpressure at glenohumeral joint 4x 45 second holds Suboccipital release 3x45 seconds     Pt educated throughout session about proper posture and technique with exercises. Improved exercise technique, movement at target joints, use of target muscles after min to mod verbal, visual, tactile cues.      Patient demonstrates improved HEP compliance with all exercises except side bending which continues to be an area of challenge for patient. She continues to require extensive cueing for side bending at this time for body mechanics. Postural education and task performance continues to be an area for improvement. Patient would benefit from skilled physical therapy to improve quality of life, will continue with current POC at this time                  PT Education - 11/14/20 0855     Education Details exercise technique, body mechanics    Person(s) Educated Patient    Methods Explanation;Demonstration;Tactile cues;Verbal cues    Comprehension Tactile cues required;Verbalized understanding;Verbal cues required;Returned demonstration              PT Short Term Goals - 10/31/20 0815       PT SHORT TERM GOAL #1   Title After 4 weeks pt will reports less fatigue in her neck, report only requiring the cervical soft collar <4 days per week.    Baseline Feels need for collar most afternoons due to fatigue 5/12: wears 2 days a week 6/21: only wears occasionally    Time 4    Period Weeks     Status Achieved    Target Date 09/14/20      PT SHORT TERM GOAL #2   Title After 4 weeks pt to demonstrate improved 5xSTS <12 seconds and without LOB.    Baseline 13.67sec c LOB on 2nd rep of 6 5/12: 13.08 no LOB 6/21: 12.65 seconds    Time 4    Period Weeks    Status Achieved    Target Date 09/14/20      PT SHORT TERM GOAL #3   Title After 4 weeks pt to demonstrate improve lumbopelvic motor control AEB ability to ATTEMPT SLS multiple times without onset cramping in legs.    Baseline has severe cramping limitations in hamstrings during testing on eval 6/21: able to perform    Time 4  Period Weeks    Status Achieved    Target Date 09/14/20               PT Long Term Goals - 11/09/20 1357       PT LONG TERM GOAL #1   Title After 6 weeks pt to score >80 on FOTO survey to demonstrate improved participation in life.    Baseline 77 at eval 5/12: 69% 6/21: 85% 6/30: 83%    Time 8    Period Weeks    Status Achieved      PT LONG TERM GOAL #2   Title After 8 weeks pt to demonstrate 5xSTS in <12sec eyes closed without LOB to demonstrate improved dynamic motor control.    Baseline eyes open at eval 5/12: eyes open 13.08 6/21: 12.65 seconds    Time 8    Period Weeks    Status Partially Met    Target Date 12/26/20      PT LONG TERM GOAL #3   Title After 8 weeks pt to demonstrate improved balance AEB SLS >5sec bilat without use of arms for righting upon failure thereafter.    Baseline <3 sec SLS, no use of arms for righting, but arresting hamstrings cramps bilat 6/21: L 15 seconds R 8 seconds    Time 8    Period Weeks    Status Achieved      PT LONG TERM GOAL #4   Title Patient will reduce Neck Disability Index score to <10% to demonstrate minimal disability with ADL's including improved sleeping tolerance, sitting tolerance, etc for better mobility at home and work.    Baseline 6/21: 21% 6/30: 14%    Time 8    Period Weeks    Status Partially Met    Target Date  12/26/20      PT LONG TERM GOAL #5   Title Patient will demonstrate increased cervical range of motion within 10degrees of normal range to increase functional mobility for iADLs such as driving.    Baseline 6/21: see note 6/30: see note: decrease in rotation and sidebending noted    Time 8    Period Weeks    Status On-going    Target Date 12/26/20      PT LONG TERM GOAL #6   Title Patient will tolerate sitting unsupported demonstrating erect sitting posture for 10 minutes for postural strengthening and reduction of cervical range of motion and stabilization.    Baseline 6/21: unable to perform 6/30: slight forward head rounded shoulders posturing    Time 8    Period Weeks    Status Partially Met    Target Date 12/26/20                   Plan - 11/14/20 0909     Clinical Impression Statement Patient demonstrates improved HEP compliance with all exercises except side bending which continues to be an area of challenge for patient. She continues to require extensive cueing for side bending at this time for body mechanics. Postural education and task performance continues to be an area for improvement. Patient would benefit from skilled physical therapy to improve quality of life, will continue with current POC at this time    Personal Factors and Comorbidities Age;Sex;Behavior Pattern;Comorbidity 1;Fitness;Time since onset of injury/illness/exacerbation    Comorbidities osteoporosis    Examination-Activity Limitations Bend;Bathing;Dressing;Lift;Reach Overhead;Stand;Stairs    Examination-Participation Restrictions Driving;Community Activity;Yard Work;Cleaning    Stability/Clinical Decision Making Unstable/Unpredictable    Rehab Potential Fair  PT Frequency 2x / week    PT Duration 8 weeks    PT Treatment/Interventions ADLs/Self Care Home Management;Cryotherapy;Electrical Stimulation;Moist Heat;DME Instruction;Gait training;Stair training;Functional mobility training;Therapeutic  activities;Therapeutic exercise;Balance training;Neuromuscular re-education;Patient/family education;Passive range of motion    PT Next Visit Plan Discuss plan of PT and limitations of care, particualrly regarding return to driivng. Screen right shoulder moblity/strength for lifting activity at home, cervical extension/flexion A/ROM assessent (already permitted for [employer]); Exhaustive balance screening to address 5+ years of falls and orthopedic insult.    PT Home Exercise Plan None issued at eval; to be determine.    Consulted and Agree with Plan of Care Patient             Patient will benefit from skilled therapeutic intervention in order to improve the following deficits and impairments:  Decreased balance, Abnormal gait, Decreased activity tolerance, Decreased knowledge of use of DME, Decreased knowledge of precautions, Decreased mobility, Decreased range of motion, Decreased strength, Hypomobility, Increased muscle spasms, Postural dysfunction, Impaired UE functional use  Visit Diagnosis: Abnormal head movements  Repeated falls  Muscle weakness (generalized)     Problem List Patient Active Problem List   Diagnosis Date Noted   Status post reverse total shoulder replacement, right 09/24/2018   S/P right colectomy 01/26/2018   Adenomatous polyp of ascending colon 11/24/2017   Generalized anxiety disorder 09/06/2017   Irritable bowel syndrome (IBS) 09/06/2017   Fracture of one rib, right side, subsequent encounter for fracture with routine healing 03/08/2017   Orthostasis 06/05/2016   Left shoulder pain 10/13/2015   Constipation 05/30/2015   Osteoporosis, postmenopausal 05/30/2015   Compression fracture of L2 lumbar vertebra with delayed healing 05/29/2015   Frequent falls 03/26/2015   Atherosclerosis of arteries 03/26/2015   Chest pain 02/09/2015   Breast cancer screening 10/01/2014   Family history of colon cancer 09/28/2014   Viral URI with cough 06/12/2014    Tenesmus (rectal) 06/12/2014   TMJ (temporomandibular joint syndrome) 06/12/2014   Varicose veins of both lower extremities without ulcer or inflammation 06/12/2014   B12 deficiency 09/20/2013   Fatigue 05/23/2013   Internal hemorrhoids without complication 49/17/9150   Insomnia 03/03/2013   Vertigo due to cerebrovascular disease 03/03/2013   Seronegative rheumatoid arthritis affecting lower leg ([place]) 03/03/2013    Janna Arch, PT, DPT  11/14/2020, 12:04 PM  [place] MAIN [facility] SERVICES [address] [place], [place], [address] Phone: [phone]   Fax:  [phone]  Name: MARCILLE BARMAN MRN: [id number] Date of Birth: 20-Nov-1931

## 2020-11-21 ENCOUNTER — Other Ambulatory Visit: Payer: Self-pay

## 2020-11-21 ENCOUNTER — Ambulatory Visit: Payer: Medicare Other

## 2020-11-21 DIAGNOSIS — R250 Abnormal head movements: Secondary | ICD-10-CM | POA: Diagnosis not present

## 2020-11-21 DIAGNOSIS — M6281 Muscle weakness (generalized): Secondary | ICD-10-CM

## 2020-11-21 DIAGNOSIS — R296 Repeated falls: Secondary | ICD-10-CM

## 2020-11-21 NOTE — Therapy (Signed)
[place] MAIN [facility] SERVICES [address] [place], [place], [address] Phone: [phone]   Fax:  [phone]  Physical Therapy Treatment  Patient Details  Name: SMRITI BARKOW MRN: [id number] Date of Birth: May 29, 1931 Referring Provider (PT): Breckenridge Shah,MD (Neurology)   Encounter Date: 11/21/2020   PT End of Session - 11/21/20 0804     Visit Number 22    Number of Visits 33    Date for PT Re-Evaluation 12/26/20    Authorization Type Medicare Parts A and B;  2/10 PN 6/30    Authorization Time Period 08/17/20-11/09/20    PT Start Time 0759    PT Stop Time 0843    PT Time Calculation (min) 44 min    Activity Tolerance Patient tolerated treatment well;No increased pain;Patient limited by fatigue    Behavior During Therapy [facility] for tasks assessed/performed             Past Medical History:  Diagnosis Date   Anemia    Anxiety    Arthritis    osteoarthritis. Bilateral knee replacement, hands, meniscal tear, cervical disc disease   Atherosclerosis    Atypical chest pain    Collagen vascular disease (HCC)    Colon polyp 12/15/2017   Colon polyps    Compression fracture of L2 (HCC)    Diverticulosis    DVT (deep venous thrombosis) (HCC)    GERD (gastroesophageal reflux disease)    History of seronegative inflammatory arthritis    Hypertension    IBS (irritable bowel syndrome)    Internal hemorrhoids    Phlebitis and thrombophlebitis of deep veins of lower extremities ([place])    after surgery   Vision abnormalities     Past Surgical History:  Procedure Laterality Date   APPENDECTOMY     COLONOSCOPY N/A 01/24/2020   Procedure: COLONOSCOPY;  Surgeon: Lesly Rubenstein, MD;  Location: [facility] ENDOSCOPY;  Service: Endoscopy;  Laterality: N/A;   COLONOSCOPY WITH PROPOFOL N/A 10/24/2017   Procedure: COLONOSCOPY WITH PROPOFOL;  Surgeon: Manya Silvas, MD;  Location: [facility] ENDOSCOPY;  Service: Endoscopy;  Laterality: N/A;   COLONOSCOPY WITH  PROPOFOL N/A 11/21/2017   Procedure: COLONOSCOPY WITH PROPOFOL;  Surgeon: Manya Silvas, MD;  Location: [facility] ENDOSCOPY;  Service: Endoscopy;  Laterality: N/A;   DILATION AND CURETTAGE OF UTERUS     ESOPHAGOGASTRODUODENOSCOPY (EGD) WITH PROPOFOL N/A 02/25/2018   Procedure: ESOPHAGOGASTRODUODENOSCOPY (EGD) WITH PROPOFOL;  Surgeon: Manya Silvas, MD;  Location: [facility] ENDOSCOPY;  Service: Endoscopy;  Laterality: N/A;   EYE SURGERY     bilateral catarACT   JOINT REPLACEMENT Bilateral    knee replacement   LAPAROSCOPIC RIGHT COLECTOMY Right 12/15/2017   Procedure: LAPAROSCOPIC RIGHT COLECTOMY;  Surgeon: Robert Bellow, MD;  Location: [facility] ORS;  Service: General;  Laterality: Right;   REPLACEMENT TOTAL KNEE BILATERAL     TOTAL SHOULDER ARTHROPLASTY Right 09/24/2018   Procedure: TOTAL SHOULDER ARTHROPLASTY - REVERSE RIGHT;  Surgeon: Corky Mull, MD;  Location: [facility] ORS;  Service: Orthopedics;  Laterality: Right;    There were no vitals filed for this visit.   Subjective Assessment - 11/21/20 0803     Subjective Patient reports she has been doing her cervical HEP. Will be out of town next week.    Pertinent History HPI    Limitations Reading    How long can you sit comfortably? Neck does not limit.    How long can you stand comfortably? Neck does not limit.  How long can you walk comfortably? Can sometimes be sore, but not limited.    Currently in Pain? No/denies                    Right Left  Flexion 35  Extension 25  Side Bending 20 20  Rotation 40 45      Treatment:   therex Seated: Cervical rotation slow controlled movements 10x each side 5 second holds, cue for slowing down motion Cervical side bend to same side 10x; max cueing for keeping head straight and sidebend only; 5 second holds; cues to not push into pain. requires max visual and demonstrative cueing for performance  Cervical extension and flexion with cues for pain free range of motion 10x 5  second holds Trunk extension 10x 3 second holds  Supine: Cervical extension and chin tuck 12x with holds PVC pipe overhead raise 10x 5 second holds    Standing: Wall posture with scapular retraction and depression 10x 5 second holds Scapular retraction with horizontal head turns 10x each direction  Snow angel wall abduction 10x cues for neutral alignment   manual: STM to cervical paraspinals and upper trap with implementation of effleurage and ptrissage x 9 minutes Supine: Cervical rotation with overpressure at glenohumeral joint 4x 45 second holds Suboccipital release 3x45 seconds     Pt educated throughout session about proper posture and technique with exercises. Improved exercise technique, movement at target joints, use of target muscles after min to mod verbal, visual, tactile cues   Patient was able to correctly demonstrate HEP for first time since beginning physical therapy indicating she is finally compliant with HEP. Cervical ROM re-tested and showed significant improvements. If patient continues current progression she will be nearing discharge. Final focus will be postural and right rotation for driving. Patient would benefit from skilled physical therapy to improve quality of life, will continue with current POC at this time                   PT Education - 11/21/20 0804     Education Details exercise technique, body mechanics    Person(s) Educated Patient    Methods Explanation;Demonstration;Tactile cues;Verbal cues    Comprehension Verbalized understanding;Returned demonstration;Verbal cues required;Tactile cues required              PT Short Term Goals - 10/31/20 0815       PT SHORT TERM GOAL #1   Title After 4 weeks pt will reports less fatigue in her neck, report only requiring the cervical soft collar <4 days per week.    Baseline Feels need for collar most afternoons due to fatigue 5/12: wears 2 days a week 6/21: only wears occasionally     Time 4    Period Weeks    Status Achieved    Target Date 09/14/20      PT SHORT TERM GOAL #2   Title After 4 weeks pt to demonstrate improved 5xSTS <12 seconds and without LOB.    Baseline 13.67sec c LOB on 2nd rep of 6 5/12: 13.08 no LOB 6/21: 12.65 seconds    Time 4    Period Weeks    Status Achieved    Target Date 09/14/20      PT SHORT TERM GOAL #3   Title After 4 weeks pt to demonstrate improve lumbopelvic motor control AEB ability to ATTEMPT SLS multiple times without onset cramping in legs.    Baseline has severe cramping limitations in hamstrings during  testing on eval 6/21: able to perform    Time 4    Period Weeks    Status Achieved    Target Date 09/14/20               PT Long Term Goals - 11/09/20 1357       PT LONG TERM GOAL #1   Title After 6 weeks pt to score >80 on FOTO survey to demonstrate improved participation in life.    Baseline 77 at eval 5/12: 69% 6/21: 85% 6/30: 83%    Time 8    Period Weeks    Status Achieved      PT LONG TERM GOAL #2   Title After 8 weeks pt to demonstrate 5xSTS in <12sec eyes closed without LOB to demonstrate improved dynamic motor control.    Baseline eyes open at eval 5/12: eyes open 13.08 6/21: 12.65 seconds    Time 8    Period Weeks    Status Partially Met    Target Date 12/26/20      PT LONG TERM GOAL #3   Title After 8 weeks pt to demonstrate improved balance AEB SLS >5sec bilat without use of arms for righting upon failure thereafter.    Baseline <3 sec SLS, no use of arms for righting, but arresting hamstrings cramps bilat 6/21: L 15 seconds R 8 seconds    Time 8    Period Weeks    Status Achieved      PT LONG TERM GOAL #4   Title Patient will reduce Neck Disability Index score to <10% to demonstrate minimal disability with ADL's including improved sleeping tolerance, sitting tolerance, etc for better mobility at home and work.    Baseline 6/21: 21% 6/30: 14%    Time 8    Period Weeks    Status Partially  Met    Target Date 12/26/20      PT LONG TERM GOAL #5   Title Patient will demonstrate increased cervical range of motion within 10degrees of normal range to increase functional mobility for iADLs such as driving.    Baseline 6/21: see note 6/30: see note: decrease in rotation and sidebending noted    Time 8    Period Weeks    Status On-going    Target Date 12/26/20      PT LONG TERM GOAL #6   Title Patient will tolerate sitting unsupported demonstrating erect sitting posture for 10 minutes for postural strengthening and reduction of cervical range of motion and stabilization.    Baseline 6/21: unable to perform 6/30: slight forward head rounded shoulders posturing    Time 8    Period Weeks    Status Partially Met    Target Date 12/26/20                   Plan - 11/21/20 9528     Clinical Impression Statement Patient was able to correctly demonstrate HEP for first time since beginning physical therapy indicating she is finally compliant with HEP. Cervical ROM re-tested and showed significant improvements. If patient continues current progression she will be nearing discharge. Final focus will be postural and right rotation for driving. Patient would benefit from skilled physical therapy to improve quality of life, will continue with current POC at this time    Personal Factors and Comorbidities Age;Sex;Behavior Pattern;Comorbidity 1;Fitness;Time since onset of injury/illness/exacerbation    Comorbidities osteoporosis    Examination-Activity Limitations Bend;Bathing;Dressing;Lift;Reach Overhead;Stand;Stairs    Examination-Participation Restrictions Driving;Community Activity;Yard Work;Cleaning  Stability/Clinical Decision Making Unstable/Unpredictable    Rehab Potential Fair    PT Frequency 2x / week    PT Duration 8 weeks    PT Treatment/Interventions ADLs/Self Care Home Management;Cryotherapy;Electrical Stimulation;Moist Heat;DME Instruction;Gait training;Stair  training;Functional mobility training;Therapeutic activities;Therapeutic exercise;Balance training;Neuromuscular re-education;Patient/family education;Passive range of motion    PT Next Visit Plan Discuss plan of PT and limitations of care, particualrly regarding return to driivng. Screen right shoulder moblity/strength for lifting activity at home, cervical extension/flexion A/ROM assessent (already permitted for [employer]); Exhaustive balance screening to address 5+ years of falls and orthopedic insult.    PT Home Exercise Plan None issued at eval; to be determine.    Consulted and Agree with Plan of Care Patient             Patient will benefit from skilled therapeutic intervention in order to improve the following deficits and impairments:  Decreased balance, Abnormal gait, Decreased activity tolerance, Decreased knowledge of use of DME, Decreased knowledge of precautions, Decreased mobility, Decreased range of motion, Decreased strength, Hypomobility, Increased muscle spasms, Postural dysfunction, Impaired UE functional use  Visit Diagnosis: Abnormal head movements  Repeated falls  Muscle weakness (generalized)     Problem List Patient Active Problem List   Diagnosis Date Noted   Status post reverse total shoulder replacement, right 09/24/2018   S/P right colectomy 01/26/2018   Adenomatous polyp of ascending colon 11/24/2017   Generalized anxiety disorder 09/06/2017   Irritable bowel syndrome (IBS) 09/06/2017   Fracture of one rib, right side, subsequent encounter for fracture with routine healing 03/08/2017   Orthostasis 06/05/2016   Left shoulder pain 10/13/2015   Constipation 05/30/2015   Osteoporosis, postmenopausal 05/30/2015   Compression fracture of L2 lumbar vertebra with delayed healing 05/29/2015   Frequent falls 03/26/2015   Atherosclerosis of arteries 03/26/2015   Chest pain 02/09/2015   Breast cancer screening 10/01/2014   Family history of colon cancer  09/28/2014   Viral URI with cough 06/12/2014   Tenesmus (rectal) 06/12/2014   TMJ (temporomandibular joint syndrome) 06/12/2014   Varicose veins of both lower extremities without ulcer or inflammation 06/12/2014   B12 deficiency 09/20/2013   Fatigue 05/23/2013   Internal hemorrhoids without complication 22/48/2500   Insomnia 03/03/2013   Vertigo due to cerebrovascular disease 03/03/2013   Seronegative rheumatoid arthritis affecting lower leg ([place]) 03/03/2013    Janna Arch, PT, DPT  11/21/2020, 8:43 AM  [place] MAIN [facility] SERVICES [address] [place], [place], [address] Phone: [phone]   Fax:  [phone]  Name: CORRIN SIELING MRN: [id number] Date of Birth: 17-Apr-1932

## 2020-11-23 ENCOUNTER — Ambulatory Visit: Payer: Medicare Other

## 2020-11-23 ENCOUNTER — Other Ambulatory Visit: Payer: Self-pay

## 2020-11-23 DIAGNOSIS — M6281 Muscle weakness (generalized): Secondary | ICD-10-CM

## 2020-11-23 DIAGNOSIS — R250 Abnormal head movements: Secondary | ICD-10-CM | POA: Diagnosis not present

## 2020-11-23 DIAGNOSIS — R296 Repeated falls: Secondary | ICD-10-CM

## 2020-11-23 NOTE — Therapy (Signed)
[place] MAIN [facility] SERVICES [address] [place], [place], [address] Phone: [phone]   Fax:  [phone]  Physical Therapy Treatment  Patient Details  Name: Teresa Lin MRN: [id number] Date of Birth: 17-Jun-1931 Referring Provider (PT): Berger Shah,MD (Neurology)   Encounter Date: 11/23/2020   PT End of Session - 11/23/20 0727     Visit Number 23    Number of Visits 33    Date for PT Re-Evaluation 12/26/20    Authorization Type Medicare Parts A and B;  3/10 PN 6/30    Authorization Time Period 08/17/20-11/09/20    PT Start Time 0715    PT Stop Time 0758    PT Time Calculation (min) 43 min    Activity Tolerance Patient tolerated treatment well;No increased pain;Patient limited by fatigue    Behavior During Therapy [facility] for tasks assessed/performed             Past Medical History:  Diagnosis Date   Anemia    Anxiety    Arthritis    osteoarthritis. Bilateral knee replacement, hands, meniscal tear, cervical disc disease   Atherosclerosis    Atypical chest pain    Collagen vascular disease (HCC)    Colon polyp 12/15/2017   Colon polyps    Compression fracture of L2 (HCC)    Diverticulosis    DVT (deep venous thrombosis) (HCC)    GERD (gastroesophageal reflux disease)    History of seronegative inflammatory arthritis    Hypertension    IBS (irritable bowel syndrome)    Internal hemorrhoids    Phlebitis and thrombophlebitis of deep veins of lower extremities ([place])    after surgery   Vision abnormalities     Past Surgical History:  Procedure Laterality Date   APPENDECTOMY     COLONOSCOPY N/A 01/24/2020   Procedure: COLONOSCOPY;  Surgeon: Lesly Rubenstein, MD;  Location: [facility] ENDOSCOPY;  Service: Endoscopy;  Laterality: N/A;   COLONOSCOPY WITH PROPOFOL N/A 10/24/2017   Procedure: COLONOSCOPY WITH PROPOFOL;  Surgeon: Manya Silvas, MD;  Location: [facility] ENDOSCOPY;  Service: Endoscopy;  Laterality: N/A;   COLONOSCOPY WITH  PROPOFOL N/A 11/21/2017   Procedure: COLONOSCOPY WITH PROPOFOL;  Surgeon: Manya Silvas, MD;  Location: [facility] ENDOSCOPY;  Service: Endoscopy;  Laterality: N/A;   DILATION AND CURETTAGE OF UTERUS     ESOPHAGOGASTRODUODENOSCOPY (EGD) WITH PROPOFOL N/A 02/25/2018   Procedure: ESOPHAGOGASTRODUODENOSCOPY (EGD) WITH PROPOFOL;  Surgeon: Manya Silvas, MD;  Location: [facility] ENDOSCOPY;  Service: Endoscopy;  Laterality: N/A;   EYE SURGERY     bilateral catarACT   JOINT REPLACEMENT Bilateral    knee replacement   LAPAROSCOPIC RIGHT COLECTOMY Right 12/15/2017   Procedure: LAPAROSCOPIC RIGHT COLECTOMY;  Surgeon: Robert Bellow, MD;  Location: [facility] ORS;  Service: General;  Laterality: Right;   REPLACEMENT TOTAL KNEE BILATERAL     TOTAL SHOULDER ARTHROPLASTY Right 09/24/2018   Procedure: TOTAL SHOULDER ARTHROPLASTY - REVERSE RIGHT;  Surgeon: Corky Mull, MD;  Location: [facility] ORS;  Service: Orthopedics;  Laterality: Right;    There were no vitals filed for this visit.   Subjective Assessment - 11/23/20 0726     Subjective Patient reports she had a bad night of sleep the night before and is tired. Has been compliant with HEP.    Pertinent History HPI    Limitations Reading    How long can you sit comfortably? Neck does not limit.    How long can you stand comfortably? Neck does not  limit.    How long can you walk comfortably? Can sometimes be sore, but not limited.    Currently in Pain? No/denies                  Treatment:   therex Seated: Cervical side bend to same side 10x; max cueing for keeping head straight and sidebend only; 5 second holds; cues to not push into pain. requires max visual and demonstrative cueing for performance  Trunk extension 10x 3 second holds  towel SNAG side bend 10x each side Towel SNAG extension 10x   Supine: Cervical extension and chin tuck 12x with holds PVC pipe overhead raise 10x 5 second holds  PVC pipe chest press 10x    Standing: Wall  posture with scapular retraction and depression 10x 5 second holds Scapular retraction with horizontal head turns 10x each direction  Wall posture with cervical extension and flexion 12x; cues for scapular retraction PVC pipe overhead raise with wall posture for scapular retraction 12x   manual: STM to cervical paraspinals and upper trap with implementation of effleurage and ptrissage x 9 minutes Supine: Cervical rotation with overpressure at glenohumeral joint 4x 45 second holds Suboccipital release 3x45 seconds Cervical side bend with overpressure at glenohumeral joint 4x 45 second holds      Pt educated throughout session about proper posture and technique with exercises. Improved exercise technique, movement at target joints, use of target muscles after min to mod verbal, visual, tactile cues  Patient is progressing with cervical mobility and stability. She does continue to require focused postural corrections and stabilization techniques. Carryover between sessions is seen now indicating patient has been compliant with HEP which was not seen previously. Patient would benefit from skilled physical therapy to improve quality of life, will continue with current POC at this time                   PT Education - 11/23/20 0727     Education Details exercise technique, body mechanics    Person(s) Educated Patient    Methods Explanation;Demonstration;Tactile cues;Verbal cues    Comprehension Verbalized understanding;Returned demonstration;Verbal cues required;Tactile cues required              PT Short Term Goals - 10/31/20 0815       PT SHORT TERM GOAL #1   Title After 4 weeks pt will reports less fatigue in her neck, report only requiring the cervical soft collar <4 days per week.    Baseline Feels need for collar most afternoons due to fatigue 5/12: wears 2 days a week 6/21: only wears occasionally    Time 4    Period Weeks    Status Achieved    Target Date  09/14/20      PT SHORT TERM GOAL #2   Title After 4 weeks pt to demonstrate improved 5xSTS <12 seconds and without LOB.    Baseline 13.67sec c LOB on 2nd rep of 6 5/12: 13.08 no LOB 6/21: 12.65 seconds    Time 4    Period Weeks    Status Achieved    Target Date 09/14/20      PT SHORT TERM GOAL #3   Title After 4 weeks pt to demonstrate improve lumbopelvic motor control AEB ability to ATTEMPT SLS multiple times without onset cramping in legs.    Baseline has severe cramping limitations in hamstrings during testing on eval 6/21: able to perform    Time 4    Period Weeks  Status Achieved    Target Date 09/14/20               PT Long Term Goals - 11/09/20 1357       PT LONG TERM GOAL #1   Title After 6 weeks pt to score >80 on FOTO survey to demonstrate improved participation in life.    Baseline 77 at eval 5/12: 69% 6/21: 85% 6/30: 83%    Time 8    Period Weeks    Status Achieved      PT LONG TERM GOAL #2   Title After 8 weeks pt to demonstrate 5xSTS in <12sec eyes closed without LOB to demonstrate improved dynamic motor control.    Baseline eyes open at eval 5/12: eyes open 13.08 6/21: 12.65 seconds    Time 8    Period Weeks    Status Partially Met    Target Date 12/26/20      PT LONG TERM GOAL #3   Title After 8 weeks pt to demonstrate improved balance AEB SLS >5sec bilat without use of arms for righting upon failure thereafter.    Baseline <3 sec SLS, no use of arms for righting, but arresting hamstrings cramps bilat 6/21: L 15 seconds R 8 seconds    Time 8    Period Weeks    Status Achieved      PT LONG TERM GOAL #4   Title Patient will reduce Neck Disability Index score to <10% to demonstrate minimal disability with ADL's including improved sleeping tolerance, sitting tolerance, etc for better mobility at home and work.    Baseline 6/21: 21% 6/30: 14%    Time 8    Period Weeks    Status Partially Met    Target Date 12/26/20      PT LONG TERM GOAL #5    Title Patient will demonstrate increased cervical range of motion within 10degrees of normal range to increase functional mobility for iADLs such as driving.    Baseline 6/21: see note 6/30: see note: decrease in rotation and sidebending noted    Time 8    Period Weeks    Status On-going    Target Date 12/26/20      PT LONG TERM GOAL #6   Title Patient will tolerate sitting unsupported demonstrating erect sitting posture for 10 minutes for postural strengthening and reduction of cervical range of motion and stabilization.    Baseline 6/21: unable to perform 6/30: slight forward head rounded shoulders posturing    Time 8    Period Weeks    Status Partially Met    Target Date 12/26/20                   Plan - 11/23/20 0731     Clinical Impression Statement Patient is progressing with cervical mobility and stability. She does continue to require focused postural corrections and stabilization techniques. Carryover between sessions is seen now indicating patient has been compliant with HEP which was not seen previously. Patient would benefit from skilled physical therapy to improve quality of life, will continue with current POC at this time    Personal Factors and Comorbidities Age;Sex;Behavior Pattern;Comorbidity 1;Fitness;Time since onset of injury/illness/exacerbation    Comorbidities osteoporosis    Examination-Activity Limitations Bend;Bathing;Dressing;Lift;Reach Overhead;Stand;Stairs    Examination-Participation Restrictions Driving;Community Activity;Yard Work;Cleaning    Stability/Clinical Decision Making Unstable/Unpredictable    Rehab Potential Fair    PT Frequency 2x / week    PT Duration 8 weeks  PT Treatment/Interventions ADLs/Self Care Home Management;Cryotherapy;Electrical Stimulation;Moist Heat;DME Instruction;Gait training;Stair training;Functional mobility training;Therapeutic activities;Therapeutic exercise;Balance training;Neuromuscular  re-education;Patient/family education;Passive range of motion    PT Next Visit Plan Discuss plan of PT and limitations of care, particualrly regarding return to driivng. Screen right shoulder moblity/strength for lifting activity at home, cervical extension/flexion A/ROM assessent (already permitted for [employer]); Exhaustive balance screening to address 5+ years of falls and orthopedic insult.    PT Home Exercise Plan None issued at eval; to be determine.    Consulted and Agree with Plan of Care Patient             Patient will benefit from skilled therapeutic intervention in order to improve the following deficits and impairments:  Decreased balance, Abnormal gait, Decreased activity tolerance, Decreased knowledge of use of DME, Decreased knowledge of precautions, Decreased mobility, Decreased range of motion, Decreased strength, Hypomobility, Increased muscle spasms, Postural dysfunction, Impaired UE functional use  Visit Diagnosis: Abnormal head movements  Muscle weakness (generalized)  Repeated falls     Problem List Patient Active Problem List   Diagnosis Date Noted   Status post reverse total shoulder replacement, right 09/24/2018   S/P right colectomy 01/26/2018   Adenomatous polyp of ascending colon 11/24/2017   Generalized anxiety disorder 09/06/2017   Irritable bowel syndrome (IBS) 09/06/2017   Fracture of one rib, right side, subsequent encounter for fracture with routine healing 03/08/2017   Orthostasis 06/05/2016   Left shoulder pain 10/13/2015   Constipation 05/30/2015   Osteoporosis, postmenopausal 05/30/2015   Compression fracture of L2 lumbar vertebra with delayed healing 05/29/2015   Frequent falls 03/26/2015   Atherosclerosis of arteries 03/26/2015   Chest pain 02/09/2015   Breast cancer screening 10/01/2014   Family history of colon cancer 09/28/2014   Viral URI with cough 06/12/2014   Tenesmus (rectal) 06/12/2014   TMJ (temporomandibular joint  syndrome) 06/12/2014   Varicose veins of both lower extremities without ulcer or inflammation 06/12/2014   B12 deficiency 09/20/2013   Fatigue 05/23/2013   Internal hemorrhoids without complication 72/01/1979   Insomnia 03/03/2013   Vertigo due to cerebrovascular disease 03/03/2013   Seronegative rheumatoid arthritis affecting lower leg ([place]) 03/03/2013    Janna Arch, PT, DPT  11/23/2020, 7:59 AM  [place] MAIN [facility] SERVICES [address] [place], [place], [address] Phone: [phone]   Fax:  [phone]  Name: Teresa Lin MRN: [id number] Date of Birth: 06/08/1931

## 2020-11-25 ENCOUNTER — Encounter: Payer: Self-pay | Admitting: Emergency Medicine

## 2020-11-25 ENCOUNTER — Other Ambulatory Visit: Payer: Self-pay

## 2020-11-25 ENCOUNTER — Emergency Department: Payer: Medicare Other

## 2020-11-25 ENCOUNTER — Emergency Department
Admission: EM | Admit: 2020-11-25 | Discharge: 2020-11-25 | Disposition: A | Discharge status: Home / Self Care | Payer: Medicare Other | Attending: Emergency Medicine | Admitting: Emergency Medicine

## 2020-11-25 DIAGNOSIS — Z7982 Long term (current) use of aspirin: Secondary | ICD-10-CM | POA: Insufficient documentation

## 2020-11-25 DIAGNOSIS — R1032 Left lower quadrant pain: Secondary | ICD-10-CM | POA: Diagnosis present

## 2020-11-25 DIAGNOSIS — I10 Essential (primary) hypertension: Secondary | ICD-10-CM | POA: Diagnosis not present

## 2020-11-25 DIAGNOSIS — Z96611 Presence of right artificial shoulder joint: Secondary | ICD-10-CM | POA: Diagnosis not present

## 2020-11-25 DIAGNOSIS — K5732 Diverticulitis of large intestine without perforation or abscess without bleeding: Secondary | ICD-10-CM | POA: Diagnosis not present

## 2020-11-25 DIAGNOSIS — Z7901 Long term (current) use of anticoagulants: Secondary | ICD-10-CM | POA: Diagnosis not present

## 2020-11-25 DIAGNOSIS — K219 Gastro-esophageal reflux disease without esophagitis: Secondary | ICD-10-CM | POA: Diagnosis not present

## 2020-11-25 DIAGNOSIS — Z96653 Presence of artificial knee joint, bilateral: Secondary | ICD-10-CM | POA: Insufficient documentation

## 2020-11-25 DIAGNOSIS — Z79899 Other long term (current) drug therapy: Secondary | ICD-10-CM | POA: Diagnosis not present

## 2020-11-25 LAB — CBC
HCT: 42.8 % (ref 36.0–46.0)
Hemoglobin: 14.2 g/dL (ref 12.0–15.0)
MCH: 32.7 pg (ref 26.0–34.0)
MCHC: 33.2 g/dL (ref 30.0–36.0)
MCV: 98.6 fL (ref 80.0–100.0)
Platelets: 243 10*3/uL (ref 150–400)
RBC: 4.34 MIL/uL (ref 3.87–5.11)
RDW: 13.6 % (ref 11.5–15.5)
WBC: 11.4 10*3/uL — ABNORMAL HIGH (ref 4.0–10.5)
nRBC: 0 % (ref 0.0–0.2)

## 2020-11-25 LAB — URINALYSIS, COMPLETE (UACMP) WITH MICROSCOPIC
Bacteria, UA: NONE SEEN
Bilirubin Urine: NEGATIVE
Glucose, UA: NEGATIVE mg/dL
Ketones, ur: NEGATIVE mg/dL
Leukocytes,Ua: NEGATIVE
Nitrite: NEGATIVE
Protein, ur: NEGATIVE mg/dL
Specific Gravity, Urine: 1.003 — ABNORMAL LOW (ref 1.005–1.030)
Squamous Epithelial / HPF: NONE SEEN (ref 0–5)
pH: 8 (ref 5.0–8.0)

## 2020-11-25 LAB — COMPREHENSIVE METABOLIC PANEL
ALT: 21 U/L (ref 0–44)
AST: 30 U/L (ref 15–41)
Albumin: 4.2 g/dL (ref 3.5–5.0)
Alkaline Phosphatase: 56 U/L (ref 38–126)
Anion gap: 6 (ref 5–15)
BUN: 14 mg/dL (ref 8–23)
CO2: 27 mmol/L (ref 22–32)
Calcium: 9 mg/dL (ref 8.9–10.3)
Chloride: 103 mmol/L (ref 98–111)
Creatinine, Ser: 0.42 mg/dL — ABNORMAL LOW (ref 0.44–1.00)
GFR, Estimated: >60 mL/min (ref >60)
Glucose, Bld: 94 mg/dL (ref 70–99)
Potassium: 4.1 mmol/L (ref 3.5–5.1)
Sodium: 136 mmol/L (ref 135–145)
Total Bilirubin: 0.8 mg/dL (ref 0.3–1.2)
Total Protein: 7.1 g/dL (ref 6.5–8.1)

## 2020-11-25 LAB — LIPASE, BLOOD: Lipase: 44 U/L (ref 11–51)

## 2020-11-25 MED ORDER — ONDANSETRON 4 MG PO TBDP: 4.0000 mg | ORAL_TABLET | Freq: Four times a day (QID) | ORAL | 0 refills | Status: DC | PRN

## 2020-11-25 MED ORDER — ONDANSETRON 4 MG PO TBDP
4.0000 mg | ORAL_TABLET | Freq: Once | ORAL | Status: AC
  Administered 2020-11-25: 4 mg via ORAL
  Filled 2020-11-25: qty 1

## 2020-11-25 MED ORDER — AMOXICILLIN-POT CLAVULANATE 875-125 MG PO TABS: 1.0000 | ORAL_TABLET | Freq: Two times a day (BID) | ORAL | 0 refills | Status: AC

## 2020-11-25 MED ORDER — AMOXICILLIN-POT CLAVULANATE 875-125 MG PO TABS
1.0000 | ORAL_TABLET | Freq: Once | ORAL | Status: AC
  Administered 2020-11-25: 1 via ORAL
  Filled 2020-11-25: qty 1

## 2020-11-25 MED ORDER — IOHEXOL 300 MG/ML  SOLN
75.0000 mL | Freq: Once | INTRAMUSCULAR | Status: AC | PRN
  Administered 2020-11-25: 75 mL via INTRAVENOUS

## 2020-11-25 MED ORDER — IOHEXOL 9 MG/ML PO SOLN
500.0000 mL | ORAL | Status: AC
  Administered 2020-11-25: 500 mL via ORAL

## 2020-11-25 NOTE — ED Triage Notes (Signed)
Pt via POV from home. Pt c/o generalized lower abdominal pain since yesterday. Denies NVD. Denies fever. Pt is A&Ox4 and NAD.

## 2020-11-25 NOTE — ED Notes (Signed)
Pt ambulated to restroom independently.

## 2020-11-25 NOTE — ED Provider Notes (Signed)
[facility] Emergency Department Provider Note   ____________________________________________   Event Date/Time   First MD Initiated Contact with Patient 11/25/20 1741     (approximate)  I have reviewed the triage vital signs and the nursing notes.   HISTORY  Chief Complaint Abdominal Pain    HPI Teresa Lin is a 87 y.o. female history of pulmonary embolism, DVT, anxiety  Patient reports for about 2 days now she is experiencing discomfort in her lower abdomen little more in the left lower.  Typically has about 3 bowel movements daily, but reports that the last 2 days she has not had 1.  She had a sense of slight feeling of fullness, as well as a mild pain in the left lower abdomen.  It gets worse when she sits up and seems to get better when she lays down  Does not think she is passed any gas today.  She denies any history of abdominal surgery.  She is not having any nausea or vomiting.  Reports the discomfort is mild, does not wish for anything for pain  Is going to the beach with family tomorrow and reports she felt like she better get this checked out evaluated before going to the beach  No pain or burning with urination.  Reports when she urinates the pain actually seems to slightly improved.  No foul odor no fevers or chills. Past Medical History:  Diagnosis Date   Anemia    Anxiety    Arthritis    osteoarthritis. Bilateral knee replacement, hands, meniscal tear, cervical disc disease   Atherosclerosis    Atypical chest pain    Collagen vascular disease (HCC)    Colon polyp 12/15/2017   Colon polyps    Compression fracture of L2 (HCC)    Diverticulosis    DVT (deep venous thrombosis) (HCC)    GERD (gastroesophageal reflux disease)    History of seronegative inflammatory arthritis    Hypertension    IBS (irritable bowel syndrome)    Internal hemorrhoids    Phlebitis and thrombophlebitis of deep veins of lower extremities ([place])    after  surgery   Vision abnormalities     Patient Active Problem List   Diagnosis Date Noted   Status post reverse total shoulder replacement, right 09/24/2018   S/P right colectomy 01/26/2018   Adenomatous polyp of ascending colon 11/24/2017   Generalized anxiety disorder 09/06/2017   Irritable bowel syndrome (IBS) 09/06/2017   Fracture of one rib, right side, subsequent encounter for fracture with routine healing 03/08/2017   Orthostasis 06/05/2016   Left shoulder pain 10/13/2015   Constipation 05/30/2015   Osteoporosis, postmenopausal 05/30/2015   Compression fracture of L2 lumbar vertebra with delayed healing 05/29/2015   Frequent falls 03/26/2015   Atherosclerosis of arteries 03/26/2015   Chest pain 02/09/2015   Breast cancer screening 10/01/2014   Family history of colon cancer 09/28/2014   Viral URI with cough 06/12/2014   Tenesmus (rectal) 06/12/2014   TMJ (temporomandibular joint syndrome) 06/12/2014   Varicose veins of both lower extremities without ulcer or inflammation 06/12/2014   B12 deficiency 09/20/2013   Fatigue 05/23/2013   Internal hemorrhoids without complication 89/38/1017   Insomnia 03/03/2013   Vertigo due to cerebrovascular disease 03/03/2013   Seronegative rheumatoid arthritis affecting lower leg ([place]) 03/03/2013    Past Surgical History:  Procedure Laterality Date   APPENDECTOMY     COLONOSCOPY N/A 01/24/2020   Procedure: COLONOSCOPY;  Surgeon: Lesly Rubenstein, MD;  Location: [facility] ENDOSCOPY;  Service: Endoscopy;  Laterality: N/A;   COLONOSCOPY WITH PROPOFOL N/A 10/24/2017   Procedure: COLONOSCOPY WITH PROPOFOL;  Surgeon: Manya Silvas, MD;  Location: [facility] ENDOSCOPY;  Service: Endoscopy;  Laterality: N/A;   COLONOSCOPY WITH PROPOFOL N/A 11/21/2017   Procedure: COLONOSCOPY WITH PROPOFOL;  Surgeon: Manya Silvas, MD;  Location: [facility] ENDOSCOPY;  Service: Endoscopy;  Laterality: N/A;   DILATION AND CURETTAGE OF UTERUS     ESOPHAGOGASTRODUODENOSCOPY  (EGD) WITH PROPOFOL N/A 02/25/2018   Procedure: ESOPHAGOGASTRODUODENOSCOPY (EGD) WITH PROPOFOL;  Surgeon: Manya Silvas, MD;  Location: [facility] ENDOSCOPY;  Service: Endoscopy;  Laterality: N/A;   EYE SURGERY     bilateral catarACT   JOINT REPLACEMENT Bilateral    knee replacement   LAPAROSCOPIC RIGHT COLECTOMY Right 12/15/2017   Procedure: LAPAROSCOPIC RIGHT COLECTOMY;  Surgeon: Robert Bellow, MD;  Location: [facility] ORS;  Service: General;  Laterality: Right;   REPLACEMENT TOTAL KNEE BILATERAL     TOTAL SHOULDER ARTHROPLASTY Right 09/24/2018   Procedure: TOTAL SHOULDER ARTHROPLASTY - REVERSE RIGHT;  Surgeon: Corky Mull, MD;  Location: [facility] ORS;  Service: Orthopedics;  Laterality: Right;    Prior to Admission medications   Medication Sig Start Date End Date Taking? Authorizing Provider  amoxicillin-clavulanate (AUGMENTIN) 875-125 MG tablet Take 1 tablet by mouth 2 (two) times daily for 10 days. 11/25/20 12/05/20 Yes Delman Kitten, MD  ondansetron (ZOFRAN ODT) 4 MG disintegrating tablet Take 1 tablet (4 mg total) by mouth every 6 (six) hours as needed for nausea or vomiting. 11/25/20  Yes Delman Kitten, MD  Abatacept (ORENCIA IV) Inject into the vein every 30 (thirty) days.    [provider]  APIXABAN Arne [place]) VTE STARTER PACK (10MG  AND 5MG ) Take as directed on package: start with two-5mg  tablets twice daily for 7 days. On day 8, switch to one-5mg  tablet twice daily. 02/03/20   Nance Pear, MD  aspirin EC 325 MG tablet Take 1 tablet (325 mg total) by mouth daily. 09/24/18   Lattie Corns, PA-C  calcium-vitamin D (OSCAL WITH D) 500-200 MG-UNIT per tablet Take 1 tablet by mouth daily.     [provider]  folic acid (FOLVITE) 1 MG tablet Take 2 mg by mouth daily.     [provider]  hydroxychloroquine (PLAQUENIL) 200 MG tablet Take 200 mg by mouth daily.    [provider]  methotrexate 50 MG/2ML injection Inject 20 mg into the skin every Sunday.   03/16/15   [provider]  Multiple Vitamins-Minerals (MULTIVITAMIN WITH MINERALS) tablet Take 1 tablet by mouth daily. Centrum Silver    [provider]  Polyethyl Glycol-Propyl Glycol 0.4-0.3 % SOLN Place 1 drop into both eyes 3 (three) times daily as needed (for eye burning/irritation).    [provider]    Allergies Infliximab, Lidocaine-menthol, and Synvisc [hylan g-f 20]  Family History  Problem Relation Age of Onset   Hypertension Mother    Cancer Mother 5       colon ca   Heart attack Mother    Hypertension Father    Heart attack Father    Hypertension Sister    Heart attack Sister    Multiple sclerosis Daughter    Multiple sclerosis Son    Multiple sclerosis Son     Social History Social History   Tobacco Use   Smoking status: Never   Smokeless tobacco: Never  Vaping Use   Vaping Use: Never used  Substance Use Topics  Alcohol use: Yes    Alcohol/week: 2.0 standard drinks    Types: 2 Cans of beer per week    Comment: occasionally    Drug use: No    Review of Systems Constitutional: No fever/chills Eyes: No visual changes. ENT: No sore throat. Cardiovascular: Denies chest pain. Respiratory: Denies shortness of breath. Gastrointestinal: See HPI Genitourinary: Negative for dysuria. Musculoskeletal: Negative for back pain. Skin: Negative for rash. Neurological: Negative for headaches, areas of focal weakness or numbness.    ____________________________________________   PHYSICAL EXAM:  VITAL SIGNS: ED Triage Vitals [11/25/20 1503]  Enc Vitals Group     BP (!) 151/86     Pulse Rate 79     Resp 20     Temp 98.1 F (36.7 C)     Temp Source Oral     SpO2 99 %     Weight 136 lb (61.7 kg)     Height 5\' 5"  (1.651 m)     Head Circumference      Peak Flow      Pain Score 8     Pain Loc      Pain Edu?      Excl. in [place]?     Constitutional: Alert and oriented. Well appearing and in no acute distress. Eyes:  Conjunctivae are normal. Head: Atraumatic. Nose: No congestion/rhinnorhea. Mouth/Throat: Mucous membranes are moist. Neck: No stridor.  Cardiovascular: Normal rate, regular rhythm. Grossly normal heart sounds.  Good peripheral circulation. Respiratory: Normal respiratory effort.  No retractions. Lungs CTAB. Gastrointestinal: Soft and nontender except she does report mild tenderness without rebound or guarding across the lower abdomen bilaterally and slightly more and a little more focally in the left lower quadrant.  Negative Murphy.  No pain epigastrium and left upper quadrant. No distention.  Bowel sounds are present. Musculoskeletal: No lower extremity tenderness nor edema. Neurologic:  Normal speech and language. No gross focal neurologic deficits are appreciated.  Skin:  Skin is warm, dry and intact. No rash noted. Psychiatric: Mood and affect are normal. Speech and behavior are normal.  ____________________________________________   LABS (all labs ordered are listed, but only abnormal results are displayed)  Labs Reviewed  COMPREHENSIVE METABOLIC PANEL - Abnormal; Notable for the following components:      Result Value   Creatinine, Ser 0.42 (*)    All other components within normal limits  CBC - Abnormal; Notable for the following components:   WBC 11.4 (*)    All other components within normal limits  URINALYSIS, COMPLETE (UACMP) WITH MICROSCOPIC - Abnormal; Notable for the following components:   Color, Urine STRAW (*)    APPearance CLEAR (*)    Specific Gravity, Urine 1.003 (*)    Hgb urine dipstick SMALL (*)    All other components within normal limits  LIPASE, BLOOD   ____________________________________________  EKG  No upper abdominal pain no chest pain no pulmonary symptoms.  No cardiac symptoms ____________________________________________  RADIOLOGY  CT ABDOMEN PELVIS W CONTRAST  Result Date: 11/25/2020 CLINICAL DATA:  Left lower quadrant pain EXAM: CT  ABDOMEN AND PELVIS WITH CONTRAST TECHNIQUE: Multidetector CT imaging of the abdomen and pelvis was performed using the standard protocol following bolus administration of intravenous contrast. CONTRAST:  25mL OMNIPAQUE IOHEXOL 300 MG/ML  SOLN COMPARISON:  CT 07/03/2018 FINDINGS: Lower chest: Lung bases demonstrate no acute consolidation or effusion. Hepatobiliary: Small calcified gallstones. No focal hepatic abnormality or biliary dilatation Pancreas: No inflammatory changes. Slight prominence of pancreatic duct with probable divisum anatomy.  Spleen: Normal in size without focal abnormality. Adrenals/Urinary Tract: Adrenal glands are normal. Subcentimeter hypodensity in the mid left kidney too small to further characterize. No hydronephrosis. The bladder is unremarkable Stomach/Bowel: The stomach is nonenlarged. No dilated small bowel. Status post right hemicolectomy. Diverticular disease of the left colon. Wall thickening and moderate inflammatory changes at the sigmoid colon consistent with acute diverticulitis. No perforation or abscess. Vascular/Lymphatic: Moderate aortic atherosclerosis. No aneurysm. No suspicious nodes Reproductive: Uterus and bilateral adnexa are unremarkable. Other: Negative for free air or free fluid Musculoskeletal: Chronic superior endplate deformity at L2. No acute or suspicious osseous abnormality. Old right inferior pubic ramus fracture. IMPRESSION: Findings consistent with acute sigmoid colon diverticulitis. No perforation or abscess at this time. Cholelithiasis Electronically Signed   By: Donavan Foil M.D.   On: 11/25/2020 20:30    Imaging reviewed images personally viewed by me as well.  Acute diverticulitis, no evidence of perforation or abscess. ____________________________________________   PROCEDURES  Procedure(s) performed: None  Procedures  Critical Care performed: No  ____________________________________________   INITIAL IMPRESSION / ASSESSMENT AND PLAN /  ED COURSE  Pertinent labs & imaging results that were available during my care of the patient were reviewed by me and considered in my medical decision making (see chart for details).   Differential diagnosis includes but is not limited to, abdominal perforation, aortic dissection, cholecystitis, appendicitis, diverticulitis, colitis, esophagitis/gastritis, kidney stone, pyelonephritis, urinary tract infection, aortic aneurysm. All are considered in decision and treatment plan. Based upon the patient's presentation and risk factors, which to exclude etiology such as obstruction early diverticulitis, kidney stone, or other acute intra-abdominal pathology.  Overall very reassuring exam her labs including CBC and metabolic panel are reassuring.  She is fully awake and alert pleasant.  No distress.  Does not wish for any pain medication or other.  Urinalysis negative for acute.  We will proceed with CT imaging to further evaluate, exclude major acute etiologies of discomfort or pain.  ----------------------------------------- 8:59 PM on 11/25/2020 -----------------------------------------  Had an extensive conversation with the patient about antibiotics, diagnosis of diverticulitis, what it is, treatment and return precautions.  Patient in agreement understanding.  She plans to hold her methotrexate and will discuss with Dr. Jefm Bryant prior to utilizing that for treatment of her arthritis as there is a potential interaction with amoxicillin which she is now aware of, but will discuss with rheumatologist.  Return precautions and treatment recommendations and follow-up discussed with the patient who is agreeable with the plan.  Patient awake alert pain controlled nontoxic in no acute distress.      ____________________________________________   FINAL CLINICAL IMPRESSION(S) / ED DIAGNOSES  Final diagnoses:  Sigmoid diverticulitis        Note:  This document was prepared using Dragon voice  recognition software and may include unintentional dictation errors       Delman Kitten, MD 11/25/20 2100

## 2020-11-28 ENCOUNTER — Ambulatory Visit: Payer: Medicare Other

## 2020-11-30 ENCOUNTER — Ambulatory Visit: Payer: Medicare Other

## 2020-12-05 ENCOUNTER — Other Ambulatory Visit: Payer: Self-pay

## 2020-12-05 ENCOUNTER — Ambulatory Visit: Payer: Medicare Other | Admitting: Physical Therapy

## 2020-12-05 DIAGNOSIS — M25631 Stiffness of right wrist, not elsewhere classified: Secondary | ICD-10-CM

## 2020-12-05 DIAGNOSIS — R252 Cramp and spasm: Secondary | ICD-10-CM

## 2020-12-05 DIAGNOSIS — M25531 Pain in right wrist: Secondary | ICD-10-CM

## 2020-12-05 DIAGNOSIS — M6281 Muscle weakness (generalized): Secondary | ICD-10-CM

## 2020-12-05 DIAGNOSIS — R296 Repeated falls: Secondary | ICD-10-CM

## 2020-12-05 DIAGNOSIS — R250 Abnormal head movements: Secondary | ICD-10-CM | POA: Diagnosis not present

## 2020-12-05 NOTE — Therapy (Signed)
[place] MAIN [facility] SERVICES [address] [place], [place], [address] Phone: [phone]   Fax:  [phone]  Physical Therapy Treatment  Patient Details  Name: Teresa Lin MRN: [id number] Date of Birth: 1932/01/09 Referring Provider (PT): Van Shah,MD (Neurology)   Encounter Date: 12/05/2020   PT End of Session - 12/05/20 1206     Visit Number 24    Number of Visits 33    Date for PT Re-Evaluation 12/26/20    Authorization Type Medicare Parts A and B;  3/10 PN 6/30    Authorization Time Period 08/17/20-11/09/20    PT Start Time 0800    PT Stop Time 0848    PT Time Calculation (min) 48 min    Activity Tolerance Patient tolerated treatment well;No increased pain;Patient limited by fatigue    Behavior During Therapy [facility] for tasks assessed/performed             Past Medical History:  Diagnosis Date   Anemia    Anxiety    Arthritis    osteoarthritis. Bilateral knee replacement, hands, meniscal tear, cervical disc disease   Atherosclerosis    Atypical chest pain    Collagen vascular disease (HCC)    Colon polyp 12/15/2017   Colon polyps    Compression fracture of L2 (HCC)    Diverticulosis    DVT (deep venous thrombosis) (HCC)    GERD (gastroesophageal reflux disease)    History of seronegative inflammatory arthritis    Hypertension    IBS (irritable bowel syndrome)    Internal hemorrhoids    Phlebitis and thrombophlebitis of deep veins of lower extremities ([place])    after surgery   Vision abnormalities     Past Surgical History:  Procedure Laterality Date   APPENDECTOMY     COLONOSCOPY N/A 01/24/2020   Procedure: COLONOSCOPY;  Surgeon: Lesly Rubenstein, MD;  Location: [facility] ENDOSCOPY;  Service: Endoscopy;  Laterality: N/A;   COLONOSCOPY WITH PROPOFOL N/A 10/24/2017   Procedure: COLONOSCOPY WITH PROPOFOL;  Surgeon: Manya Silvas, MD;  Location: [facility] ENDOSCOPY;  Service: Endoscopy;  Laterality: N/A;   COLONOSCOPY WITH  PROPOFOL N/A 11/21/2017   Procedure: COLONOSCOPY WITH PROPOFOL;  Surgeon: Manya Silvas, MD;  Location: [facility] ENDOSCOPY;  Service: Endoscopy;  Laterality: N/A;   DILATION AND CURETTAGE OF UTERUS     ESOPHAGOGASTRODUODENOSCOPY (EGD) WITH PROPOFOL N/A 02/25/2018   Procedure: ESOPHAGOGASTRODUODENOSCOPY (EGD) WITH PROPOFOL;  Surgeon: Manya Silvas, MD;  Location: [facility] ENDOSCOPY;  Service: Endoscopy;  Laterality: N/A;   EYE SURGERY     bilateral catarACT   JOINT REPLACEMENT Bilateral    knee replacement   LAPAROSCOPIC RIGHT COLECTOMY Right 12/15/2017   Procedure: LAPAROSCOPIC RIGHT COLECTOMY;  Surgeon: Robert Bellow, MD;  Location: [facility] ORS;  Service: General;  Laterality: Right;   REPLACEMENT TOTAL KNEE BILATERAL     TOTAL SHOULDER ARTHROPLASTY Right 09/24/2018   Procedure: TOTAL SHOULDER ARTHROPLASTY - REVERSE RIGHT;  Surgeon: Corky Mull, MD;  Location: [facility] ORS;  Service: Orthopedics;  Laterality: Right;    There were no vitals filed for this visit.   Subjective Assessment - 12/05/20 0805     Subjective Pt states she went to ER last Saturday due to diverticulitis, pt finishes antibiotics today. She had questions about diet relating to medications - PT told pt to refer this question to MD as ER recommened pt follow up with internist  (appointment next week). She reports a sore throat today, no fever. Denies pain.  Pertinent History HPI    Limitations Reading    How long can you sit comfortably? Neck does not limit.    How long can you stand comfortably? Neck does not limit.    How long can you walk comfortably? Can sometimes be sore, but not limited.    Currently in Pain? No/denies             Treatment:   therex Seated: Cervical side bend to same side 10x; max cueing for keeping head straight and sidebend only; 5 second holds; cues to not push into pain. requires max visual and demonstrative cueing for performance  Trunk extension 10x 3 second holds Towel SNAG side  bend 10x each side Towel SNAG extension 10x    Supine: Cervical extension and chin tuck 12x with holds PVC pipe overhead raise 10x 5 second holds  PVC pipe chest press 10x    Standing: (created MedBridge to include these 3 in HEP) Wall posture with scapular retraction and depression 10x 5 second holds Scapular retraction with horizontal head turns 10x each direction  Wall posture with cervical extension and flexion 12x; cues for scapular retraction   Manual: STM to cervical paraspinals, suboccipitals and upper trap with implementation of effleurage and ptrissage x 5 minutes     Pt educated throughout session about proper posture and technique with exercises. Improved exercise technique, movement at target joints, use of target muscles after min to mod verbal, visual, tactile cues   Patient arrived with excellent motivation to today's session. She continues to progress with cervical mobility and stability. She does continue to require moderate VC, visual demonstration and tactile cueing in order to maintain correct technique. Pt does inquire about increased scapular stabilization exercises; HEP printout was provided with the three scapular stabilization exercises performed in session. Patient would benefit from skilled physical therapy to improve quality of life, will continue with current POC at this time.      PT Education - 12/05/20 1205     Education Details Provided HEP (MedBridge handout) for scapular stabilization    Person(s) Educated Patient    Methods Explanation;Demonstration;Tactile cues;Verbal cues;Handout    Comprehension Verbalized understanding;Returned demonstration              PT Short Term Goals - 10/31/20 0815       PT SHORT TERM GOAL #1   Title After 4 weeks pt will reports less fatigue in her neck, report only requiring the cervical soft collar <4 days per week.    Baseline Feels need for collar most afternoons due to fatigue 5/12: wears 2 days a week  6/21: only wears occasionally    Time 4    Period Weeks    Status Achieved    Target Date 09/14/20      PT SHORT TERM GOAL #2   Title After 4 weeks pt to demonstrate improved 5xSTS <12 seconds and without LOB.    Baseline 13.67sec c LOB on 2nd rep of 6 5/12: 13.08 no LOB 6/21: 12.65 seconds    Time 4    Period Weeks    Status Achieved    Target Date 09/14/20      PT SHORT TERM GOAL #3   Title After 4 weeks pt to demonstrate improve lumbopelvic motor control AEB ability to ATTEMPT SLS multiple times without onset cramping in legs.    Baseline has severe cramping limitations in hamstrings during testing on eval 6/21: able to perform    Time 4  Period Weeks    Status Achieved    Target Date 09/14/20               PT Long Term Goals - 11/09/20 1357       PT LONG TERM GOAL #1   Title After 6 weeks pt to score >80 on FOTO survey to demonstrate improved participation in life.    Baseline 77 at eval 5/12: 69% 6/21: 85% 6/30: 83%    Time 8    Period Weeks    Status Achieved      PT LONG TERM GOAL #2   Title After 8 weeks pt to demonstrate 5xSTS in <12sec eyes closed without LOB to demonstrate improved dynamic motor control.    Baseline eyes open at eval 5/12: eyes open 13.08 6/21: 12.65 seconds    Time 8    Period Weeks    Status Partially Met    Target Date 12/26/20      PT LONG TERM GOAL #3   Title After 8 weeks pt to demonstrate improved balance AEB SLS >5sec bilat without use of arms for righting upon failure thereafter.    Baseline <3 sec SLS, no use of arms for righting, but arresting hamstrings cramps bilat 6/21: L 15 seconds R 8 seconds    Time 8    Period Weeks    Status Achieved      PT LONG TERM GOAL #4   Title Patient will reduce Neck Disability Index score to <10% to demonstrate minimal disability with ADL's including improved sleeping tolerance, sitting tolerance, etc for better mobility at home and work.    Baseline 6/21: 21% 6/30: 14%    Time 8     Period Weeks    Status Partially Met    Target Date 12/26/20      PT LONG TERM GOAL #5   Title Patient will demonstrate increased cervical range of motion within 10degrees of normal range to increase functional mobility for iADLs such as driving.    Baseline 6/21: see note 6/30: see note: decrease in rotation and sidebending noted    Time 8    Period Weeks    Status On-going    Target Date 12/26/20      PT LONG TERM GOAL #6   Title Patient will tolerate sitting unsupported demonstrating erect sitting posture for 10 minutes for postural strengthening and reduction of cervical range of motion and stabilization.    Baseline 6/21: unable to perform 6/30: slight forward head rounded shoulders posturing    Time 8    Period Weeks    Status Partially Met    Target Date 12/26/20                   Plan - 12/05/20 1209     Clinical Impression Statement Patient arrived with excellent motivation to today's session. She continues to progress with cervical mobility and stability. She does continue to require moderate VC, visual demonstration and tactile cueing in order to maintain correct technique. Pt does inquire about increased scapular stabilization exercises; HEP printout was provided with the three scapular stabilization exercises performed in session. Patient would benefit from skilled physical therapy to improve quality of life, will continue with current POC at this time.    Personal Factors and Comorbidities Age;Sex;Behavior Pattern;Comorbidity 1;Fitness;Time since onset of injury/illness/exacerbation    Comorbidities osteoporosis    Examination-Activity Limitations Bend;Bathing;Dressing;Lift;Reach Overhead;Stand;Stairs    Examination-Participation Restrictions Driving;Community Activity;Yard Work;Cleaning    Stability/Clinical Decision Making Unstable/Unpredictable  Rehab Potential Fair    PT Frequency 2x / week    PT Duration 8 weeks    PT Treatment/Interventions ADLs/Self  Care Home Management;Cryotherapy;Electrical Stimulation;Moist Heat;DME Instruction;Gait training;Stair training;Functional mobility training;Therapeutic activities;Therapeutic exercise;Balance training;Neuromuscular re-education;Patient/family education;Passive range of motion    PT Next Visit Plan Discuss plan of PT and limitations of care, particualrly regarding return to driivng. Screen right shoulder moblity/strength for lifting activity at home, cervical extension/flexion A/ROM assessent (already permitted for [employer]); Exhaustive balance screening to address 5+ years of falls and orthopedic insult.    PT Home Exercise Plan None issued at eval; to be determine.    Consulted and Agree with Plan of Care Patient             Patient will benefit from skilled therapeutic intervention in order to improve the following deficits and impairments:  Decreased balance, Abnormal gait, Decreased activity tolerance, Decreased knowledge of use of DME, Decreased knowledge of precautions, Decreased mobility, Decreased range of motion, Decreased strength, Hypomobility, Increased muscle spasms, Postural dysfunction, Impaired UE functional use  Visit Diagnosis: Abnormal head movements  Muscle weakness (generalized)  Pain in right wrist  Stiffness of right wrist, not elsewhere classified  Repeated falls  Cramp and spasm     Problem List Patient Active Problem List   Diagnosis Date Noted   Status post reverse total shoulder replacement, right 09/24/2018   S/P right colectomy 01/26/2018   Adenomatous polyp of ascending colon 11/24/2017   Generalized anxiety disorder 09/06/2017   Irritable bowel syndrome (IBS) 09/06/2017   Fracture of one rib, right side, subsequent encounter for fracture with routine healing 03/08/2017   Orthostasis 06/05/2016   Left shoulder pain 10/13/2015   Constipation 05/30/2015   Osteoporosis, postmenopausal 05/30/2015   Compression fracture of L2 lumbar vertebra  with delayed healing 05/29/2015   Frequent falls 03/26/2015   Atherosclerosis of arteries 03/26/2015   Chest pain 02/09/2015   Breast cancer screening 10/01/2014   Family history of colon cancer 09/28/2014   Viral URI with cough 06/12/2014   Tenesmus (rectal) 06/12/2014   TMJ (temporomandibular joint syndrome) 06/12/2014   Varicose veins of both lower extremities without ulcer or inflammation 06/12/2014   B12 deficiency 09/20/2013   Fatigue 05/23/2013   Internal hemorrhoids without complication 38/17/7116   Insomnia 03/03/2013   Vertigo due to cerebrovascular disease 03/03/2013   Seronegative rheumatoid arthritis affecting lower leg (HCC) 03/03/2013   Patrina Levering PT, DPT  [facility] 12/05/2020, 12:12 PM  [place] [address] [place], [place], [address] Phone: [phone]   Fax:  [phone]  Name: Teresa Lin MRN: [id number] Date of Birth: [birth year]

## 2020-12-07 ENCOUNTER — Other Ambulatory Visit: Payer: Self-pay

## 2020-12-07 ENCOUNTER — Ambulatory Visit: Payer: Medicare Other

## 2020-12-07 DIAGNOSIS — R250 Abnormal head movements: Secondary | ICD-10-CM

## 2020-12-07 DIAGNOSIS — M6281 Muscle weakness (generalized): Secondary | ICD-10-CM

## 2020-12-07 NOTE — Therapy (Signed)
[place] MAIN [facility] SERVICES [address] [place], [place], [address] Phone: [phone]   Fax:  [phone]  Physical Therapy Treatment  Patient Details  Name: Teresa Lin MRN: [id number] Date of Birth: 08-Mar-1932 Referring Provider (PT): Eldorado Shah,MD (Neurology)   Encounter Date: 12/07/2020   PT End of Session - 12/07/20 0722     Visit Number 25    Number of Visits 33    Date for PT Re-Evaluation 12/26/20    Authorization Type Medicare Parts A and B;  5/10 PN 6/30    Authorization Time Period 08/17/20-11/09/20    PT Start Time 0719    PT Stop Time 0758    PT Time Calculation (min) 39 min    Activity Tolerance Patient tolerated treatment well;No increased pain;Patient limited by fatigue    Behavior During Therapy [facility] for tasks assessed/performed             Past Medical History:  Diagnosis Date   Anemia    Anxiety    Arthritis    osteoarthritis. Bilateral knee replacement, hands, meniscal tear, cervical disc disease   Atherosclerosis    Atypical chest pain    Collagen vascular disease (HCC)    Colon polyp 12/15/2017   Colon polyps    Compression fracture of L2 (HCC)    Diverticulosis    DVT (deep venous thrombosis) (HCC)    GERD (gastroesophageal reflux disease)    History of seronegative inflammatory arthritis    Hypertension    IBS (irritable bowel syndrome)    Internal hemorrhoids    Phlebitis and thrombophlebitis of deep veins of lower extremities ([place])    after surgery   Vision abnormalities     Past Surgical History:  Procedure Laterality Date   APPENDECTOMY     COLONOSCOPY N/A 01/24/2020   Procedure: COLONOSCOPY;  Surgeon: Lesly Rubenstein, MD;  Location: [facility] ENDOSCOPY;  Service: Endoscopy;  Laterality: N/A;   COLONOSCOPY WITH PROPOFOL N/A 10/24/2017   Procedure: COLONOSCOPY WITH PROPOFOL;  Surgeon: Manya Silvas, MD;  Location: [facility] ENDOSCOPY;  Service: Endoscopy;  Laterality: N/A;   COLONOSCOPY WITH  PROPOFOL N/A 11/21/2017   Procedure: COLONOSCOPY WITH PROPOFOL;  Surgeon: Manya Silvas, MD;  Location: [facility] ENDOSCOPY;  Service: Endoscopy;  Laterality: N/A;   DILATION AND CURETTAGE OF UTERUS     ESOPHAGOGASTRODUODENOSCOPY (EGD) WITH PROPOFOL N/A 02/25/2018   Procedure: ESOPHAGOGASTRODUODENOSCOPY (EGD) WITH PROPOFOL;  Surgeon: Manya Silvas, MD;  Location: [facility] ENDOSCOPY;  Service: Endoscopy;  Laterality: N/A;   EYE SURGERY     bilateral catarACT   JOINT REPLACEMENT Bilateral    knee replacement   LAPAROSCOPIC RIGHT COLECTOMY Right 12/15/2017   Procedure: LAPAROSCOPIC RIGHT COLECTOMY;  Surgeon: Robert Bellow, MD;  Location: [facility] ORS;  Service: General;  Laterality: Right;   REPLACEMENT TOTAL KNEE BILATERAL     TOTAL SHOULDER ARTHROPLASTY Right 09/24/2018   Procedure: TOTAL SHOULDER ARTHROPLASTY - REVERSE RIGHT;  Surgeon: Corky Mull, MD;  Location: [facility] ORS;  Service: Orthopedics;  Laterality: Right;    There were no vitals filed for this visit.   Subjective Assessment - 12/07/20 0722     Subjective Patient was on antibiotic and liquid diet due to diverticulitis. Has not been compliant with HEP for over a week.    Pertinent History HPI    Limitations Reading    How long can you sit comfortably? Neck does not limit.    How long can you stand comfortably? Neck does not  limit.    How long can you walk comfortably? Can sometimes be sore, but not limited.    Currently in Pain? No/denies                Treatment:   therex Seated: Cervical side bend to same side 10x; max cueing for keeping head straight and sidebend only; 5 second holds; cues to not push into pain. requires max visual and demonstrative cueing for performance  Trunk extension 10x 5 second holds Towel SNAG side bend 10x each side Towel SNAG extension 10x    Supine: Cervical extension and chin tuck 12x with holds PVC pipe overhead raise 10x 5 second holds  PVC pipe chest press 10x    Standing:   Wall posture with scapular retraction and depression 10x 5 second holds Scapular retraction with horizontal head turns 10x each direction  Wall posture with cervical extension and flexion 12x; cues for scapular retraction Wall posture with PVC pipe overhead raise 10x    Manual: STM to cervical paraspinals, suboccipitals and upper trap with implementation of effleurage and ptrissage x 5 minutes    Patient more verbose throughout session requiring orientation cueing throughout session. Patient educated on need for compliance with HEP for progression of mobility of cervical range of motion. She has improved posture with external cueing of wall. Patient would benefit from skilled physical therapy to improve quality of life, will continue with current POC at this time                 PT Education - 12/07/20 0722     Education Details exercise technique, body mechanics    Person(s) Educated Patient    Methods Explanation;Demonstration;Verbal cues;Tactile cues    Comprehension Verbalized understanding;Returned demonstration;Verbal cues required;Tactile cues required              PT Short Term Goals - 10/31/20 0815       PT SHORT TERM GOAL #1   Title After 4 weeks pt will reports less fatigue in her neck, report only requiring the cervical soft collar <4 days per week.    Baseline Feels need for collar most afternoons due to fatigue 5/12: wears 2 days a week 6/21: only wears occasionally    Time 4    Period Weeks    Status Achieved    Target Date 09/14/20      PT SHORT TERM GOAL #2   Title After 4 weeks pt to demonstrate improved 5xSTS <12 seconds and without LOB.    Baseline 13.67sec c LOB on 2nd rep of 6 5/12: 13.08 no LOB 6/21: 12.65 seconds    Time 4    Period Weeks    Status Achieved    Target Date 09/14/20      PT SHORT TERM GOAL #3   Title After 4 weeks pt to demonstrate improve lumbopelvic motor control AEB ability to ATTEMPT SLS multiple times without  onset cramping in legs.    Baseline has severe cramping limitations in hamstrings during testing on eval 6/21: able to perform    Time 4    Period Weeks    Status Achieved    Target Date 09/14/20               PT Long Term Goals - 11/09/20 1357       PT LONG TERM GOAL #1   Title After 6 weeks pt to score >80 on FOTO survey to demonstrate improved participation in life.    Baseline 77  at eval 5/12: 69% 6/21: 85% 6/30: 83%    Time 8    Period Weeks    Status Achieved      PT LONG TERM GOAL #2   Title After 8 weeks pt to demonstrate 5xSTS in <12sec eyes closed without LOB to demonstrate improved dynamic motor control.    Baseline eyes open at eval 5/12: eyes open 13.08 6/21: 12.65 seconds    Time 8    Period Weeks    Status Partially Met    Target Date 12/26/20      PT LONG TERM GOAL #3   Title After 8 weeks pt to demonstrate improved balance AEB SLS >5sec bilat without use of arms for righting upon failure thereafter.    Baseline <3 sec SLS, no use of arms for righting, but arresting hamstrings cramps bilat 6/21: L 15 seconds R 8 seconds    Time 8    Period Weeks    Status Achieved      PT LONG TERM GOAL #4   Title Patient will reduce Neck Disability Index score to <10% to demonstrate minimal disability with ADL's including improved sleeping tolerance, sitting tolerance, etc for better mobility at home and work.    Baseline 6/21: 21% 6/30: 14%    Time 8    Period Weeks    Status Partially Met    Target Date 12/26/20      PT LONG TERM GOAL #5   Title Patient will demonstrate increased cervical range of motion within 10degrees of normal range to increase functional mobility for iADLs such as driving.    Baseline 6/21: see note 6/30: see note: decrease in rotation and sidebending noted    Time 8    Period Weeks    Status On-going    Target Date 12/26/20      PT LONG TERM GOAL #6   Title Patient will tolerate sitting unsupported demonstrating erect sitting posture  for 10 minutes for postural strengthening and reduction of cervical range of motion and stabilization.    Baseline 6/21: unable to perform 6/30: slight forward head rounded shoulders posturing    Time 8    Period Weeks    Status Partially Met    Target Date 12/26/20                   Plan - 12/07/20 0746     Clinical Impression Statement Patient more verbose throughout session requiring orientation cueing throughout session. Patient educated on need for compliance with HEP for progression of mobility of cervical range of motion. She has improved posture with external cueing of wall. Patient would benefit from skilled physical therapy to improve quality of life, will continue with current POC at this time    Personal Factors and Comorbidities Age;Sex;Behavior Pattern;Comorbidity 1;Fitness;Time since onset of injury/illness/exacerbation    Comorbidities osteoporosis    Examination-Activity Limitations Bend;Bathing;Dressing;Lift;Reach Overhead;Stand;Stairs    Examination-Participation Restrictions Driving;Community Activity;Yard Work;Cleaning    Stability/Clinical Decision Making Unstable/Unpredictable    Rehab Potential Fair    PT Frequency 2x / week    PT Duration 8 weeks    PT Treatment/Interventions ADLs/Self Care Home Management;Cryotherapy;Electrical Stimulation;Moist Heat;DME Instruction;Gait training;Stair training;Functional mobility training;Therapeutic activities;Therapeutic exercise;Balance training;Neuromuscular re-education;Patient/family education;Passive range of motion    PT Next Visit Plan Discuss plan of PT and limitations of care, particualrly regarding return to driivng. Screen right shoulder moblity/strength for lifting activity at home, cervical extension/flexion A/ROM assessent (already permitted for [employer]); Exhaustive balance screening to address 5+  years of falls and orthopedic insult.    PT Home Exercise Plan None issued at eval; to be determine.     Consulted and Agree with Plan of Care Patient             Patient will benefit from skilled therapeutic intervention in order to improve the following deficits and impairments:  Decreased balance, Abnormal gait, Decreased activity tolerance, Decreased knowledge of use of DME, Decreased knowledge of precautions, Decreased mobility, Decreased range of motion, Decreased strength, Hypomobility, Increased muscle spasms, Postural dysfunction, Impaired UE functional use  Visit Diagnosis: Abnormal head movements  Muscle weakness (generalized)     Problem List Patient Active Problem List   Diagnosis Date Noted   Status post reverse total shoulder replacement, right 09/24/2018   S/P right colectomy 01/26/2018   Adenomatous polyp of ascending colon 11/24/2017   Generalized anxiety disorder 09/06/2017   Irritable bowel syndrome (IBS) 09/06/2017   Fracture of one rib, right side, subsequent encounter for fracture with routine healing 03/08/2017   Orthostasis 06/05/2016   Left shoulder pain 10/13/2015   Constipation 05/30/2015   Osteoporosis, postmenopausal 05/30/2015   Compression fracture of L2 lumbar vertebra with delayed healing 05/29/2015   Frequent falls 03/26/2015   Atherosclerosis of arteries 03/26/2015   Chest pain 02/09/2015   Breast cancer screening 10/01/2014   Family history of colon cancer 09/28/2014   Viral URI with cough 06/12/2014   Tenesmus (rectal) 06/12/2014   TMJ (temporomandibular joint syndrome) 06/12/2014   Varicose veins of both lower extremities without ulcer or inflammation 06/12/2014   B12 deficiency 09/20/2013   Fatigue 05/23/2013   Internal hemorrhoids without complication 16/02/9603   Insomnia 03/03/2013   Vertigo due to cerebrovascular disease 03/03/2013   Seronegative rheumatoid arthritis affecting lower leg ([place]) 03/03/2013   Janna Arch, PT, DPT  12/07/2020, 7:59 AM  [place] MAIN [facility] SERVICES [address] [place], [place], [address] Phone: [phone]   Fax:  [phone]  Name: Teresa Lin MRN: [id number] Date of Birth: 07-16-1931

## 2020-12-11 ENCOUNTER — Ambulatory Visit: Payer: Medicare Other | Attending: Internal Medicine

## 2020-12-11 ENCOUNTER — Other Ambulatory Visit: Payer: Self-pay

## 2020-12-11 DIAGNOSIS — M6281 Muscle weakness (generalized): Secondary | ICD-10-CM | POA: Diagnosis present

## 2020-12-11 DIAGNOSIS — R250 Abnormal head movements: Secondary | ICD-10-CM | POA: Insufficient documentation

## 2020-12-11 NOTE — Therapy (Signed)
[place] MAIN [facility] SERVICES [address] [place], [place], [address] Phone: [phone]   Fax:  [phone]  Physical Therapy Treatment  Patient Details  Name: Teresa Lin MRN: [id number] Date of Birth: 04/11/1932 Referring Provider (PT): Omena Shah,MD (Neurology)   Encounter Date: 12/11/2020   PT End of Session - 12/11/20 0724     Visit Number 26    Number of Visits 33    Date for PT Re-Evaluation 12/26/20    Authorization Type Medicare Parts A and B;  6/10 PN 6/30    Authorization Time Period 08/17/20-11/09/20    PT Start Time 0717    PT Stop Time 0758    PT Time Calculation (min) 41 min    Activity Tolerance Patient tolerated treatment well;No increased pain;Patient limited by fatigue    Behavior During Therapy [facility] for tasks assessed/performed             Past Medical History:  Diagnosis Date   Anemia    Anxiety    Arthritis    osteoarthritis. Bilateral knee replacement, hands, meniscal tear, cervical disc disease   Atherosclerosis    Atypical chest pain    Collagen vascular disease (HCC)    Colon polyp 12/15/2017   Colon polyps    Compression fracture of L2 (HCC)    Diverticulosis    DVT (deep venous thrombosis) (HCC)    GERD (gastroesophageal reflux disease)    History of seronegative inflammatory arthritis    Hypertension    IBS (irritable bowel syndrome)    Internal hemorrhoids    Phlebitis and thrombophlebitis of deep veins of lower extremities ([place])    after surgery   Vision abnormalities     Past Surgical History:  Procedure Laterality Date   APPENDECTOMY     COLONOSCOPY N/A 01/24/2020   Procedure: COLONOSCOPY;  Surgeon: Lesly Rubenstein, MD;  Location: [facility] ENDOSCOPY;  Service: Endoscopy;  Laterality: N/A;   COLONOSCOPY WITH PROPOFOL N/A 10/24/2017   Procedure: COLONOSCOPY WITH PROPOFOL;  Surgeon: Manya Silvas, MD;  Location: [facility] ENDOSCOPY;  Service: Endoscopy;  Laterality: N/A;   COLONOSCOPY WITH  PROPOFOL N/A 11/21/2017   Procedure: COLONOSCOPY WITH PROPOFOL;  Surgeon: Manya Silvas, MD;  Location: [facility] ENDOSCOPY;  Service: Endoscopy;  Laterality: N/A;   DILATION AND CURETTAGE OF UTERUS     ESOPHAGOGASTRODUODENOSCOPY (EGD) WITH PROPOFOL N/A 02/25/2018   Procedure: ESOPHAGOGASTRODUODENOSCOPY (EGD) WITH PROPOFOL;  Surgeon: Manya Silvas, MD;  Location: [facility] ENDOSCOPY;  Service: Endoscopy;  Laterality: N/A;   EYE SURGERY     bilateral catarACT   JOINT REPLACEMENT Bilateral    knee replacement   LAPAROSCOPIC RIGHT COLECTOMY Right 12/15/2017   Procedure: LAPAROSCOPIC RIGHT COLECTOMY;  Surgeon: Robert Bellow, MD;  Location: [facility] ORS;  Service: General;  Laterality: Right;   REPLACEMENT TOTAL KNEE BILATERAL     TOTAL SHOULDER ARTHROPLASTY Right 09/24/2018   Procedure: TOTAL SHOULDER ARTHROPLASTY - REVERSE RIGHT;  Surgeon: Corky Mull, MD;  Location: [facility] ORS;  Service: Orthopedics;  Laterality: Right;    There were no vitals filed for this visit.   Subjective Assessment - 12/11/20 0722     Subjective Patient reports she has been having pain during her HEP again, has been compliant with HEP since last session after conversation about need for compliance.    Pertinent History HPI    Limitations Reading    How long can you sit comfortably? Neck does not limit.    How long can you  stand comfortably? Neck does not limit.    How long can you walk comfortably? Can sometimes be sore, but not limited.    Currently in Pain? No/denies               Treatment:   therex Seated: Cervical rotation 10x 5 second holds each direction, cue for not pushing into pain, slow controlled movements.  Cervical side bend to same side 10x; max cueing for keeping head straight and sidebend only; 5 second holds; cues to not push into pain. requires max visual and demonstrative cueing for performance  Trunk extension 10x 5 second holds    Supine: 4lb pipe overhead raise 10x 5 second holds   4lb pipe chest press 10x    Standing:  Wall posture with scapular retraction and depression 10x 5 second holds Scapular retraction with horizontal head turns 10x each direction  Wall posture with cervical extension and flexion 12x; cues for scapular retraction Wall posture with cervical rotation 10x each side    Manual: STM to cervical paraspinals, suboccipitals and upper trap with implementation of effleurage and ptrissage x 5 minutes Supine: cervical side bend with overpressure at glenohumeral joint 4x 30 second holds each side  Supine cervical rotation with overpressure at glenohumeral joint 4x 30 second holds each side      Patient re-educated on HEP for slow controlled movements to decrease pain. Continued education on not pushing into pain just stretch. External cueing for posture continues to be required due to preference for rounded shoulders and forward head positioning. Patient would benefit from skilled physical therapy to improve quality of life, will continue with current POC at this time                          PT Education - 12/11/20 0723     Education Details pushing into stretch NOT PAIN    Person(s) Educated Patient    Methods Explanation;Demonstration;Tactile cues;Verbal cues    Comprehension Verbalized understanding;Returned demonstration;Verbal cues required;Tactile cues required              PT Short Term Goals - 10/31/20 0815       PT SHORT TERM GOAL #1   Title After 4 weeks pt will reports less fatigue in her neck, report only requiring the cervical soft collar <4 days per week.    Baseline Feels need for collar most afternoons due to fatigue 5/12: wears 2 days a week 6/21: only wears occasionally    Time 4    Period Weeks    Status Achieved    Target Date 09/14/20      PT SHORT TERM GOAL #2   Title After 4 weeks pt to demonstrate improved 5xSTS <12 seconds and without LOB.    Baseline 13.67sec c LOB on 2nd rep of 6 5/12:  13.08 no LOB 6/21: 12.65 seconds    Time 4    Period Weeks    Status Achieved    Target Date 09/14/20      PT SHORT TERM GOAL #3   Title After 4 weeks pt to demonstrate improve lumbopelvic motor control AEB ability to ATTEMPT SLS multiple times without onset cramping in legs.    Baseline has severe cramping limitations in hamstrings during testing on eval 6/21: able to perform    Time 4    Period Weeks    Status Achieved    Target Date 09/14/20  PT Long Term Goals - 11/09/20 1357       PT LONG TERM GOAL #1   Title After 6 weeks pt to score >80 on FOTO survey to demonstrate improved participation in life.    Baseline 77 at eval 5/12: 69% 6/21: 85% 6/30: 83%    Time 8    Period Weeks    Status Achieved      PT LONG TERM GOAL #2   Title After 8 weeks pt to demonstrate 5xSTS in <12sec eyes closed without LOB to demonstrate improved dynamic motor control.    Baseline eyes open at eval 5/12: eyes open 13.08 6/21: 12.65 seconds    Time 8    Period Weeks    Status Partially Met    Target Date 12/26/20      PT LONG TERM GOAL #3   Title After 8 weeks pt to demonstrate improved balance AEB SLS >5sec bilat without use of arms for righting upon failure thereafter.    Baseline <3 sec SLS, no use of arms for righting, but arresting hamstrings cramps bilat 6/21: L 15 seconds R 8 seconds    Time 8    Period Weeks    Status Achieved      PT LONG TERM GOAL #4   Title Patient will reduce Neck Disability Index score to <10% to demonstrate minimal disability with ADL's including improved sleeping tolerance, sitting tolerance, etc for better mobility at home and work.    Baseline 6/21: 21% 6/30: 14%    Time 8    Period Weeks    Status Partially Met    Target Date 12/26/20      PT LONG TERM GOAL #5   Title Patient will demonstrate increased cervical range of motion within 10degrees of normal range to increase functional mobility for iADLs such as driving.    Baseline  6/21: see note 6/30: see note: decrease in rotation and sidebending noted    Time 8    Period Weeks    Status On-going    Target Date 12/26/20      PT LONG TERM GOAL #6   Title Patient will tolerate sitting unsupported demonstrating erect sitting posture for 10 minutes for postural strengthening and reduction of cervical range of motion and stabilization.    Baseline 6/21: unable to perform 6/30: slight forward head rounded shoulders posturing    Time 8    Period Weeks    Status Partially Met    Target Date 12/26/20                   Plan - 12/11/20 8416     Clinical Impression Statement Patient re-educated on HEP for slow controlled movements to decrease pain. Continued education on not pushing into pain just stretch. External cueing for posture continues to be required due to preference for rounded shoulders and forward head positioning. Patient would benefit from skilled physical therapy to improve quality of life, will continue with current POC at this time    Personal Factors and Comorbidities Age;Sex;Behavior Pattern;Comorbidity 1;Fitness;Time since onset of injury/illness/exacerbation    Comorbidities osteoporosis    Examination-Activity Limitations Bend;Bathing;Dressing;Lift;Reach Overhead;Stand;Stairs    Examination-Participation Restrictions Driving;Community Activity;Yard Work;Cleaning    Stability/Clinical Decision Making Unstable/Unpredictable    Rehab Potential Fair    PT Frequency 2x / week    PT Duration 8 weeks    PT Treatment/Interventions ADLs/Self Care Home Management;Cryotherapy;Electrical Stimulation;Moist Heat;DME Instruction;Gait training;Stair training;Functional mobility training;Therapeutic activities;Therapeutic exercise;Balance training;Neuromuscular re-education;Patient/family education;Passive range of motion  PT Next Visit Plan Discuss plan of PT and limitations of care, particualrly regarding return to driivng. Screen right shoulder  moblity/strength for lifting activity at home, cervical extension/flexion A/ROM assessent (already permitted for [employer]); Exhaustive balance screening to address 5+ years of falls and orthopedic insult.    PT Home Exercise Plan None issued at eval; to be determine.    Consulted and Agree with Plan of Care Patient             Patient will benefit from skilled therapeutic intervention in order to improve the following deficits and impairments:  Decreased balance, Abnormal gait, Decreased activity tolerance, Decreased knowledge of use of DME, Decreased knowledge of precautions, Decreased mobility, Decreased range of motion, Decreased strength, Hypomobility, Increased muscle spasms, Postural dysfunction, Impaired UE functional use  Visit Diagnosis: Abnormal head movements  Muscle weakness (generalized)     Problem List Patient Active Problem List   Diagnosis Date Noted   Status post reverse total shoulder replacement, right 09/24/2018   S/P right colectomy 01/26/2018   Adenomatous polyp of ascending colon 11/24/2017   Generalized anxiety disorder 09/06/2017   Irritable bowel syndrome (IBS) 09/06/2017   Fracture of one rib, right side, subsequent encounter for fracture with routine healing 03/08/2017   Orthostasis 06/05/2016   Left shoulder pain 10/13/2015   Constipation 05/30/2015   Osteoporosis, postmenopausal 05/30/2015   Compression fracture of L2 lumbar vertebra with delayed healing 05/29/2015   Frequent falls 03/26/2015   Atherosclerosis of arteries 03/26/2015   Chest pain 02/09/2015   Breast cancer screening 10/01/2014   Family history of colon cancer 09/28/2014   Viral URI with cough 06/12/2014   Tenesmus (rectal) 06/12/2014   TMJ (temporomandibular joint syndrome) 06/12/2014   Varicose veins of both lower extremities without ulcer or inflammation 06/12/2014   B12 deficiency 09/20/2013   Fatigue 05/23/2013   Internal hemorrhoids without complication 12/75/1700    Insomnia 03/03/2013   Vertigo due to cerebrovascular disease 03/03/2013   Seronegative rheumatoid arthritis affecting lower leg ([place]) 03/03/2013   Janna Arch, PT, DPT  12/11/2020, 8:01 AM  [place] MAIN [facility] SERVICES [address] [place], [place], [address] Phone: [phone]   Fax:  [phone]  Name: NENA HAMPE MRN: [id number] Date of Birth: [birth year]

## 2020-12-14 ENCOUNTER — Ambulatory Visit: Payer: Medicare Other

## 2020-12-14 ENCOUNTER — Other Ambulatory Visit: Payer: Self-pay

## 2020-12-14 DIAGNOSIS — R250 Abnormal head movements: Secondary | ICD-10-CM

## 2020-12-14 DIAGNOSIS — M6281 Muscle weakness (generalized): Secondary | ICD-10-CM

## 2020-12-14 NOTE — Therapy (Signed)
[place] MAIN [facility] SERVICES [address] [place], [place], [address] Phone: [phone]   Fax:  [phone]  Physical Therapy Treatment  Patient Details  Name: Teresa Lin MRN: [id number] Date of Birth: 04/05/1932 Referring Provider (PT): [place] Shah,MD (Neurology)   Encounter Date: 12/14/2020   PT End of Session - 12/14/20 0721     Visit Number 27    Number of Visits 33    Date for PT Re-Evaluation 12/26/20    Authorization Type Medicare Parts A and B;  7/10 PN 6/30    Authorization Time Period 08/17/20-11/09/20    PT Start Time 0717    PT Stop Time 0759    PT Time Calculation (min) 42 min    Activity Tolerance Patient tolerated treatment well;No increased pain;Patient limited by fatigue    Behavior During Therapy [facility] for tasks assessed/performed             Past Medical History:  Diagnosis Date   Anemia    Anxiety    Arthritis    osteoarthritis. Bilateral knee replacement, hands, meniscal tear, cervical disc disease   Atherosclerosis    Atypical chest pain    Collagen vascular disease (HCC)    Colon polyp 12/15/2017   Colon polyps    Compression fracture of L2 (HCC)    Diverticulosis    DVT (deep venous thrombosis) (HCC)    GERD (gastroesophageal reflux disease)    History of seronegative inflammatory arthritis    Hypertension    IBS (irritable bowel syndrome)    Internal hemorrhoids    Phlebitis and thrombophlebitis of deep veins of lower extremities ([place])    after surgery   Vision abnormalities     Past Surgical History:  Procedure Laterality Date   APPENDECTOMY     COLONOSCOPY N/A 01/24/2020   Procedure: COLONOSCOPY;  Surgeon: Lesly Rubenstein, MD;  Location: [facility] ENDOSCOPY;  Service: Endoscopy;  Laterality: N/A;   COLONOSCOPY WITH PROPOFOL N/A 10/24/2017   Procedure: COLONOSCOPY WITH PROPOFOL;  Surgeon: Manya Silvas, MD;  Location: [facility] ENDOSCOPY;  Service: Endoscopy;  Laterality: N/A;   COLONOSCOPY WITH  PROPOFOL N/A 11/21/2017   Procedure: COLONOSCOPY WITH PROPOFOL;  Surgeon: Manya Silvas, MD;  Location: [facility] ENDOSCOPY;  Service: Endoscopy;  Laterality: N/A;   DILATION AND CURETTAGE OF UTERUS     ESOPHAGOGASTRODUODENOSCOPY (EGD) WITH PROPOFOL N/A 02/25/2018   Procedure: ESOPHAGOGASTRODUODENOSCOPY (EGD) WITH PROPOFOL;  Surgeon: Manya Silvas, MD;  Location: [facility] ENDOSCOPY;  Service: Endoscopy;  Laterality: N/A;   EYE SURGERY     bilateral catarACT   JOINT REPLACEMENT Bilateral    knee replacement   LAPAROSCOPIC RIGHT COLECTOMY Right 12/15/2017   Procedure: LAPAROSCOPIC RIGHT COLECTOMY;  Surgeon: Robert Bellow, MD;  Location: [facility] ORS;  Service: General;  Laterality: Right;   REPLACEMENT TOTAL KNEE BILATERAL     TOTAL SHOULDER ARTHROPLASTY Right 09/24/2018   Procedure: TOTAL SHOULDER ARTHROPLASTY - REVERSE RIGHT;  Surgeon: Corky Mull, MD;  Location: [facility] ORS;  Service: Orthopedics;  Laterality: Right;    There were no vitals filed for this visit.   Subjective Assessment - 12/14/20 0720     Subjective Patient reports she has had pulling in her neck with her HEP but not pain, did her HEP late last night but did some of it.    Pertinent History HPI    Limitations Reading    How long can you sit comfortably? Neck does not limit.    How long can  you stand comfortably? Neck does not limit.    How long can you walk comfortably? Can sometimes be sore, but not limited.    Currently in Pain? No/denies                 Treatment:   therex Seated: Cervical rotation 10x 5 second holds each direction, cue for not pushing into pain, slow controlled movements.  Cervical side bend to same side 10x; max cueing for keeping head straight and sidebend only; 5 second holds; cues to not push into pain. requires max visual and demonstrative cueing for performance  Cervical extension and chin tuck 10x with focus on slow controlled movements  Trunk extension 10x 5 second holds      Supine: 4lb pipe overhead raise 10x 5 second holds  4lb pipe chest press 10x ; cue for slow speed for optimal muscle recruitment    Standing:  Wall posture with scapular retraction and depression 10x 5 second holds Scapular retraction with horizontal head turns 10x each direction  Wall posture with cervical extension and flexion 12x; cues for scapular retraction Wall posture with cervical rotation 10x each side    Manual: STM to cervical paraspinals, suboccipitals and upper trap with implementation of effleurage and ptrissage x 5 minutes Supine: cervical side bend with overpressure at glenohumeral joint 4x 30 second holds each side Supine cervical rotation with overpressure at glenohumeral joint 4x 30 second holds each side    Patient educated on upcoming assessment to determine continuation of care or discharge. Patient verbalized understanding and appreciative of early heads up to prepare self. Improved side bending with focused task orientation however quality of movement declines as patient becomes distracted. Patient would benefit from skilled physical therapy to improve quality of life, will continue with current POC at this time                        PT Education - 12/14/20 0721     Education Details exercise technique, body  mechanics    Person(s) Educated Patient    Methods Explanation;Demonstration;Tactile cues;Verbal cues    Comprehension Verbalized understanding;Returned demonstration;Verbal cues required;Tactile cues required              PT Short Term Goals - 10/31/20 0815       PT SHORT TERM GOAL #1   Title After 4 weeks pt will reports less fatigue in her neck, report only requiring the cervical soft collar <4 days per week.    Baseline Feels need for collar most afternoons due to fatigue 5/12: wears 2 days a week 6/21: only wears occasionally    Time 4    Period Weeks    Status Achieved    Target Date 09/14/20      PT SHORT TERM GOAL  #2   Title After 4 weeks pt to demonstrate improved 5xSTS <12 seconds and without LOB.    Baseline 13.67sec c LOB on 2nd rep of 6 5/12: 13.08 no LOB 6/21: 12.65 seconds    Time 4    Period Weeks    Status Achieved    Target Date 09/14/20      PT SHORT TERM GOAL #3   Title After 4 weeks pt to demonstrate improve lumbopelvic motor control AEB ability to ATTEMPT SLS multiple times without onset cramping in legs.    Baseline has severe cramping limitations in hamstrings during testing on eval 6/21: able to perform    Time 4  Period Weeks    Status Achieved    Target Date 09/14/20               PT Long Term Goals - 11/09/20 1357       PT LONG TERM GOAL #1   Title After 6 weeks pt to score >80 on FOTO survey to demonstrate improved participation in life.    Baseline 77 at eval 5/12: 69% 6/21: 85% 6/30: 83%    Time 8    Period Weeks    Status Achieved      PT LONG TERM GOAL #2   Title After 8 weeks pt to demonstrate 5xSTS in <12sec eyes closed without LOB to demonstrate improved dynamic motor control.    Baseline eyes open at eval 5/12: eyes open 13.08 6/21: 12.65 seconds    Time 8    Period Weeks    Status Partially Met    Target Date 12/26/20      PT LONG TERM GOAL #3   Title After 8 weeks pt to demonstrate improved balance AEB SLS >5sec bilat without use of arms for righting upon failure thereafter.    Baseline <3 sec SLS, no use of arms for righting, but arresting hamstrings cramps bilat 6/21: L 15 seconds R 8 seconds    Time 8    Period Weeks    Status Achieved      PT LONG TERM GOAL #4   Title Patient will reduce Neck Disability Index score to <10% to demonstrate minimal disability with ADL's including improved sleeping tolerance, sitting tolerance, etc for better mobility at home and work.    Baseline 6/21: 21% 6/30: 14%    Time 8    Period Weeks    Status Partially Met    Target Date 12/26/20      PT LONG TERM GOAL #5   Title Patient will demonstrate  increased cervical range of motion within 10degrees of normal range to increase functional mobility for iADLs such as driving.    Baseline 6/21: see note 6/30: see note: decrease in rotation and sidebending noted    Time 8    Period Weeks    Status On-going    Target Date 12/26/20      PT LONG TERM GOAL #6   Title Patient will tolerate sitting unsupported demonstrating erect sitting posture for 10 minutes for postural strengthening and reduction of cervical range of motion and stabilization.    Baseline 6/21: unable to perform 6/30: slight forward head rounded shoulders posturing    Time 8    Period Weeks    Status Partially Met    Target Date 12/26/20                   Plan - 12/14/20 0726     Clinical Impression Statement Patient educated on upcoming assessment to determine continuation of care or discharge. Patient verbalized understanding and appreciative of early heads up to prepare self. Improved side bending with focused task orientation however quality of movement declines as patient becomes distracted. Patient would benefit from skilled physical therapy to improve quality of life, will continue with current POC at this time    Personal Factors and Comorbidities Age;Sex;Behavior Pattern;Comorbidity 1;Fitness;Time since onset of injury/illness/exacerbation    Comorbidities osteoporosis    Examination-Activity Limitations Bend;Bathing;Dressing;Lift;Reach Overhead;Stand;Stairs    Examination-Participation Restrictions Driving;Community Activity;Yard Work;Cleaning    Stability/Clinical Decision Making Unstable/Unpredictable    Rehab Potential Fair    PT Frequency 2x / week  PT Duration 8 weeks    PT Treatment/Interventions ADLs/Self Care Home Management;Cryotherapy;Electrical Stimulation;Moist Heat;DME Instruction;Gait training;Stair training;Functional mobility training;Therapeutic activities;Therapeutic exercise;Balance training;Neuromuscular re-education;Patient/family  education;Passive range of motion    PT Next Visit Plan Discuss plan of PT and limitations of care, particualrly regarding return to driivng. Screen right shoulder moblity/strength for lifting activity at home, cervical extension/flexion A/ROM assessent (already permitted for [employer]); Exhaustive balance screening to address 5+ years of falls and orthopedic insult.    PT Home Exercise Plan None issued at eval; to be determine.    Consulted and Agree with Plan of Care Patient             Patient will benefit from skilled therapeutic intervention in order to improve the following deficits and impairments:  Decreased balance, Abnormal gait, Decreased activity tolerance, Decreased knowledge of use of DME, Decreased knowledge of precautions, Decreased mobility, Decreased range of motion, Decreased strength, Hypomobility, Increased muscle spasms, Postural dysfunction, Impaired UE functional use  Visit Diagnosis: Abnormal head movements  Muscle weakness (generalized)     Problem List Patient Active Problem List   Diagnosis Date Noted   Status post reverse total shoulder replacement, right 09/24/2018   S/P right colectomy 01/26/2018   Adenomatous polyp of ascending colon 11/24/2017   Generalized anxiety disorder 09/06/2017   Irritable bowel syndrome (IBS) 09/06/2017   Fracture of one rib, right side, subsequent encounter for fracture with routine healing 03/08/2017   Orthostasis 06/05/2016   Left shoulder pain 10/13/2015   Constipation 05/30/2015   Osteoporosis, postmenopausal 05/30/2015   Compression fracture of L2 lumbar vertebra with delayed healing 05/29/2015   Frequent falls 03/26/2015   Atherosclerosis of arteries 03/26/2015   Chest pain 02/09/2015   Breast cancer screening 10/01/2014   Family history of colon cancer 09/28/2014   Viral URI with cough 06/12/2014   Tenesmus (rectal) 06/12/2014   TMJ (temporomandibular joint syndrome) 06/12/2014   Varicose veins of both  lower extremities without ulcer or inflammation 06/12/2014   B12 deficiency 09/20/2013   Fatigue 05/23/2013   Internal hemorrhoids without complication 72/53/6644   Insomnia 03/03/2013   Vertigo due to cerebrovascular disease 03/03/2013   Seronegative rheumatoid arthritis affecting lower leg (HCC) 03/03/2013    Janna Arch 12/14/2020, 7:59 AM  [place] MAIN [facility] SERVICES [address] [place], [place], [address] Phone: [phone]   Fax:  [phone]  Name: ALIZEE MAPLE MRN: [id number] Date of Birth: 12-Nov-1931

## 2020-12-19 ENCOUNTER — Other Ambulatory Visit: Payer: Self-pay

## 2020-12-19 ENCOUNTER — Ambulatory Visit: Payer: Medicare Other

## 2020-12-19 DIAGNOSIS — R250 Abnormal head movements: Secondary | ICD-10-CM

## 2020-12-19 DIAGNOSIS — M6281 Muscle weakness (generalized): Secondary | ICD-10-CM

## 2020-12-19 NOTE — Therapy (Signed)
[place] MAIN [facility] SERVICES [address] [place], [place], [address] Phone: [phone]   Fax:  [phone]  Physical Therapy Treatment  Patient Details  Name: Teresa Lin MRN: [id number] Date of Birth: [birth year] Referring Provider (PT): Norwood Shah,MD (Neurology)   Encounter Date: 12/19/2020   PT End of Session - 12/19/20 0719     Visit Number 28    Number of Visits 33    Date for PT Re-Evaluation 12/26/20    Authorization Type Medicare Parts A and B;  8/10 PN 6/30    Authorization Time Period 08/17/20-11/09/20    PT Start Time 0715    PT Stop Time 6553    PT Time Calculation (min) 42 min    Activity Tolerance Patient tolerated treatment well;No increased pain;Patient limited by fatigue    Behavior During Therapy [facility] for tasks assessed/performed             Past Medical History:  Diagnosis Date   Anemia    Anxiety    Arthritis    osteoarthritis. Bilateral knee replacement, hands, meniscal tear, cervical disc disease   Atherosclerosis    Atypical chest pain    Collagen vascular disease (HCC)    Colon polyp 12/15/2017   Colon polyps    Compression fracture of L2 (HCC)    Diverticulosis    DVT (deep venous thrombosis) (HCC)    GERD (gastroesophageal reflux disease)    History of seronegative inflammatory arthritis    Hypertension    IBS (irritable bowel syndrome)    Internal hemorrhoids    Phlebitis and thrombophlebitis of deep veins of lower extremities ([place])    after surgery   Vision abnormalities     Past Surgical History:  Procedure Laterality Date   APPENDECTOMY     COLONOSCOPY N/A 01/24/2020   Procedure: COLONOSCOPY;  Surgeon: Lesly Rubenstein, MD;  Location: [facility] ENDOSCOPY;  Service: Endoscopy;  Laterality: N/A;   COLONOSCOPY WITH PROPOFOL N/A 10/24/2017   Procedure: COLONOSCOPY WITH PROPOFOL;  Surgeon: Manya Silvas, MD;  Location: [facility] ENDOSCOPY;  Service: Endoscopy;  Laterality: N/A;   COLONOSCOPY WITH  PROPOFOL N/A 11/21/2017   Procedure: COLONOSCOPY WITH PROPOFOL;  Surgeon: Manya Silvas, MD;  Location: [facility] ENDOSCOPY;  Service: Endoscopy;  Laterality: N/A;   DILATION AND CURETTAGE OF UTERUS     ESOPHAGOGASTRODUODENOSCOPY (EGD) WITH PROPOFOL N/A 02/25/2018   Procedure: ESOPHAGOGASTRODUODENOSCOPY (EGD) WITH PROPOFOL;  Surgeon: Manya Silvas, MD;  Location: [facility] ENDOSCOPY;  Service: Endoscopy;  Laterality: N/A;   EYE SURGERY     bilateral catarACT   JOINT REPLACEMENT Bilateral    knee replacement   LAPAROSCOPIC RIGHT COLECTOMY Right 12/15/2017   Procedure: LAPAROSCOPIC RIGHT COLECTOMY;  Surgeon: Robert Bellow, MD;  Location: [facility] ORS;  Service: General;  Laterality: Right;   REPLACEMENT TOTAL KNEE BILATERAL     TOTAL SHOULDER ARTHROPLASTY Right 09/24/2018   Procedure: TOTAL SHOULDER ARTHROPLASTY - REVERSE RIGHT;  Surgeon: Corky Mull, MD;  Location: [facility] ORS;  Service: Orthopedics;  Laterality: Right;    There were no vitals filed for this visit.   Subjective Assessment - 12/19/20 0716     Subjective Patient had a minor fall from a step ladder over the weekend. Did not do her HEP over the weekend. Is having some shoulder discomfort over the weekend but not this morning. Is aware thursday is test day.    Pertinent History HPI    Limitations Reading    How long can you  sit comfortably? Neck does not limit.    How long can you stand comfortably? Neck does not limit.    How long can you walk comfortably? Can sometimes be sore, but not limited.    Currently in Pain? No/denies                    Treatment:   therex Seated: Cervical rotation 10x 5 second holds each direction, cue for not pushing into pain, slow controlled movements.  Cervical side bend to same side 10x; max cueing for keeping head straight and sidebend only; 5 second holds; cues to not push into pain. requires max visual and demonstrative cueing for performance  Cervical extension and chin tuck 10x  with focus on slow controlled movements  Trunk extension 10x 5 second holds     Supine: 4lb pipe overhead raise 10x 5 second holds  4lb pipe chest press 10x ; cue for slow speed for optimal muscle recruitment    Standing:  Wall posture with scapular retraction and depression 10x 5 second holds Scapular retraction with horizontal head turns 10x each direction  Wall posture with cervical extension and flexion 12x; cues for scapular retraction Wall posture with bilateral shoulder abduction 10x   Manual: STM to cervical paraspinals, suboccipitals and upper trap with implementation of effleurage and ptrissage x 7 minutes Supine: cervical side bend with overpressure at glenohumeral joint 4x 30 second holds each side Supine cervical rotation with overpressure at glenohumeral joint 4x 30 second holds each side    Patient is aware next session will be assessment day. Continued education on importance of HEP compliance during PT and after discharge. She is able to demonstrate functional cervical lengthening interventions with progressive range with repetition. Patient would benefit from skilled physical therapy to improve quality of life, will continue with current POC at this time                     PT Education - 12/19/20 0718     Education Details exercise technique, body mechanics    Person(s) Educated Patient    Methods Explanation;Demonstration;Tactile cues;Verbal cues    Comprehension Verbalized understanding;Returned demonstration;Verbal cues required;Tactile cues required              PT Short Term Goals - 10/31/20 0815       PT SHORT TERM GOAL #1   Title After 4 weeks pt will reports less fatigue in her neck, report only requiring the cervical soft collar <4 days per week.    Baseline Feels need for collar most afternoons due to fatigue 5/12: wears 2 days a week 6/21: only wears occasionally    Time 4    Period Weeks    Status Achieved    Target Date  09/14/20      PT SHORT TERM GOAL #2   Title After 4 weeks pt to demonstrate improved 5xSTS <12 seconds and without LOB.    Baseline 13.67sec c LOB on 2nd rep of 6 5/12: 13.08 no LOB 6/21: 12.65 seconds    Time 4    Period Weeks    Status Achieved    Target Date 09/14/20      PT SHORT TERM GOAL #3   Title After 4 weeks pt to demonstrate improve lumbopelvic motor control AEB ability to ATTEMPT SLS multiple times without onset cramping in legs.    Baseline has severe cramping limitations in hamstrings during testing on eval 6/21: able to perform  Time 4    Period Weeks    Status Achieved    Target Date 09/14/20               PT Long Term Goals - 11/09/20 1357       PT LONG TERM GOAL #1   Title After 6 weeks pt to score >80 on FOTO survey to demonstrate improved participation in life.    Baseline 77 at eval 5/12: 69% 6/21: 85% 6/30: 83%    Time 8    Period Weeks    Status Achieved      PT LONG TERM GOAL #2   Title After 8 weeks pt to demonstrate 5xSTS in <12sec eyes closed without LOB to demonstrate improved dynamic motor control.    Baseline eyes open at eval 5/12: eyes open 13.08 6/21: 12.65 seconds    Time 8    Period Weeks    Status Partially Met    Target Date 12/26/20      PT LONG TERM GOAL #3   Title After 8 weeks pt to demonstrate improved balance AEB SLS >5sec bilat without use of arms for righting upon failure thereafter.    Baseline <3 sec SLS, no use of arms for righting, but arresting hamstrings cramps bilat 6/21: L 15 seconds R 8 seconds    Time 8    Period Weeks    Status Achieved      PT LONG TERM GOAL #4   Title Patient will reduce Neck Disability Index score to <10% to demonstrate minimal disability with ADL's including improved sleeping tolerance, sitting tolerance, etc for better mobility at home and work.    Baseline 6/21: 21% 6/30: 14%    Time 8    Period Weeks    Status Partially Met    Target Date 12/26/20      PT LONG TERM GOAL #5    Title Patient will demonstrate increased cervical range of motion within 10degrees of normal range to increase functional mobility for iADLs such as driving.    Baseline 6/21: see note 6/30: see note: decrease in rotation and sidebending noted    Time 8    Period Weeks    Status On-going    Target Date 12/26/20      PT LONG TERM GOAL #6   Title Patient will tolerate sitting unsupported demonstrating erect sitting posture for 10 minutes for postural strengthening and reduction of cervical range of motion and stabilization.    Baseline 6/21: unable to perform 6/30: slight forward head rounded shoulders posturing    Time 8    Period Weeks    Status Partially Met    Target Date 12/26/20                   Plan - 12/19/20 0734     Clinical Impression Statement Patient is aware next session will be assessment day. Continued education on importance of HEP compliance during PT and after discharge. She is able to demonstrate functional cervical lengthening interventions with progressive range with repetition. Patient would benefit from skilled physical therapy to improve quality of life, will continue with current POC at this time    Personal Factors and Comorbidities Age;Sex;Behavior Pattern;Comorbidity 1;Fitness;Time since onset of injury/illness/exacerbation    Comorbidities osteoporosis    Examination-Activity Limitations Bend;Bathing;Dressing;Lift;Reach Overhead;Stand;Stairs    Examination-Participation Restrictions Driving;Community Activity;Yard Work;Cleaning    Stability/Clinical Decision Making Unstable/Unpredictable    Rehab Potential Fair    PT Frequency 2x / week  PT Duration 8 weeks    PT Treatment/Interventions ADLs/Self Care Home Management;Cryotherapy;Electrical Stimulation;Moist Heat;DME Instruction;Gait training;Stair training;Functional mobility training;Therapeutic activities;Therapeutic exercise;Balance training;Neuromuscular re-education;Patient/family  education;Passive range of motion    PT Next Visit Plan Discuss plan of PT and limitations of care, particualrly regarding return to driivng. Screen right shoulder moblity/strength for lifting activity at home, cervical extension/flexion A/ROM assessent (already permitted for [employer]); Exhaustive balance screening to address 5+ years of falls and orthopedic insult.    PT Home Exercise Plan None issued at eval; to be determine.    Consulted and Agree with Plan of Care Patient             Patient will benefit from skilled therapeutic intervention in order to improve the following deficits and impairments:  Decreased balance, Abnormal gait, Decreased activity tolerance, Decreased knowledge of use of DME, Decreased knowledge of precautions, Decreased mobility, Decreased range of motion, Decreased strength, Hypomobility, Increased muscle spasms, Postural dysfunction, Impaired UE functional use  Visit Diagnosis: Abnormal head movements  Muscle weakness (generalized)     Problem List Patient Active Problem List   Diagnosis Date Noted   Status post reverse total shoulder replacement, right 09/24/2018   S/P right colectomy 01/26/2018   Adenomatous polyp of ascending colon 11/24/2017   Generalized anxiety disorder 09/06/2017   Irritable bowel syndrome (IBS) 09/06/2017   Fracture of one rib, right side, subsequent encounter for fracture with routine healing 03/08/2017   Orthostasis 06/05/2016   Left shoulder pain 10/13/2015   Constipation 05/30/2015   Osteoporosis, postmenopausal 05/30/2015   Compression fracture of L2 lumbar vertebra with delayed healing 05/29/2015   Frequent falls 03/26/2015   Atherosclerosis of arteries 03/26/2015   Chest pain 02/09/2015   Breast cancer screening 10/01/2014   Family history of colon cancer 09/28/2014   Viral URI with cough 06/12/2014   Tenesmus (rectal) 06/12/2014   TMJ (temporomandibular joint syndrome) 06/12/2014   Varicose veins of both  lower extremities without ulcer or inflammation 06/12/2014   B12 deficiency 09/20/2013   Fatigue 05/23/2013   Internal hemorrhoids without complication 02/26/5101   Insomnia 03/03/2013   Vertigo due to cerebrovascular disease 03/03/2013   Seronegative rheumatoid arthritis affecting lower leg ([place]) 03/03/2013    Janna Arch, PT, DPT  12/19/2020, 7:58 AM  [place] MAIN [facility] SERVICES [address] [place], [place], [address] Phone: [phone]   Fax:  [phone]  Name: SALLIE STARON MRN: [id number] Date of Birth: 01/18/1932

## 2020-12-21 ENCOUNTER — Ambulatory Visit: Payer: Medicare Other

## 2020-12-21 ENCOUNTER — Other Ambulatory Visit: Payer: Self-pay

## 2020-12-21 DIAGNOSIS — M6281 Muscle weakness (generalized): Secondary | ICD-10-CM

## 2020-12-21 DIAGNOSIS — R250 Abnormal head movements: Secondary | ICD-10-CM

## 2020-12-21 NOTE — Therapy (Signed)
[place] MAIN [facility] SERVICES [address] [place], [place], [address] Phone: [phone]   Fax:  [phone]  Physical Therapy Treatment/DISCHARGE  Patient Details  Name: Teresa Lin MRN: [id number] Date of Birth: [birth year] Referring Provider (PT): [place] Shah,MD (Neurology)   Encounter Date: 12/21/2020   PT End of Session - 12/21/20 0752     Visit Number 29    Number of Visits 33    Date for PT Re-Evaluation 12/26/20    Authorization Type Medicare Parts A and B;  9/10 PN 6/30    Authorization Time Period 08/17/20-11/09/20    PT Start Time 0715    PT Stop Time 7915    PT Time Calculation (min) 33 min    Activity Tolerance Patient tolerated treatment well;No increased pain;Patient limited by fatigue    Behavior During Therapy [facility] for tasks assessed/performed             Past Medical History:  Diagnosis Date   Anemia    Anxiety    Arthritis    osteoarthritis. Bilateral knee replacement, hands, meniscal tear, cervical disc disease   Atherosclerosis    Atypical chest pain    Collagen vascular disease (HCC)    Colon polyp 12/15/2017   Colon polyps    Compression fracture of L2 (HCC)    Diverticulosis    DVT (deep venous thrombosis) (HCC)    GERD (gastroesophageal reflux disease)    History of seronegative inflammatory arthritis    Hypertension    IBS (irritable bowel syndrome)    Internal hemorrhoids    Phlebitis and thrombophlebitis of deep veins of lower extremities ([place])    after surgery   Vision abnormalities     Past Surgical History:  Procedure Laterality Date   APPENDECTOMY     COLONOSCOPY N/A 01/24/2020   Procedure: COLONOSCOPY;  Surgeon: Lesly Rubenstein, MD;  Location: [facility] ENDOSCOPY;  Service: Endoscopy;  Laterality: N/A;   COLONOSCOPY WITH PROPOFOL N/A 10/24/2017   Procedure: COLONOSCOPY WITH PROPOFOL;  Surgeon: Manya Silvas, MD;  Location: [facility] ENDOSCOPY;  Service: Endoscopy;  Laterality: N/A;    COLONOSCOPY WITH PROPOFOL N/A 11/21/2017   Procedure: COLONOSCOPY WITH PROPOFOL;  Surgeon: Manya Silvas, MD;  Location: [facility] ENDOSCOPY;  Service: Endoscopy;  Laterality: N/A;   DILATION AND CURETTAGE OF UTERUS     ESOPHAGOGASTRODUODENOSCOPY (EGD) WITH PROPOFOL N/A 02/25/2018   Procedure: ESOPHAGOGASTRODUODENOSCOPY (EGD) WITH PROPOFOL;  Surgeon: Manya Silvas, MD;  Location: [facility] ENDOSCOPY;  Service: Endoscopy;  Laterality: N/A;   EYE SURGERY     bilateral catarACT   JOINT REPLACEMENT Bilateral    knee replacement   LAPAROSCOPIC RIGHT COLECTOMY Right 12/15/2017   Procedure: LAPAROSCOPIC RIGHT COLECTOMY;  Surgeon: Robert Bellow, MD;  Location: [facility] ORS;  Service: General;  Laterality: Right;   REPLACEMENT TOTAL KNEE BILATERAL     TOTAL SHOULDER ARTHROPLASTY Right 09/24/2018   Procedure: TOTAL SHOULDER ARTHROPLASTY - REVERSE RIGHT;  Surgeon: Corky Mull, MD;  Location: [facility] ORS;  Service: Orthopedics;  Laterality: Right;    There were no vitals filed for this visit.   Subjective Assessment - 12/21/20 0734     Subjective Patient reports she is pleased with where her neck is at. Is not limited by her neck anymore.    Limitations Reading    How long can you sit comfortably? Neck does not limit.    How long can you stand comfortably? Neck does not limit.    How long can you  walk comfortably? Can sometimes be sore, but not limited.    Currently in Pain? No/denies                FOTO: 93%  5x STS with eyes closed: MET  NDI: 9%  Cervical ROM    Right Left  Flexion 55  Extension 45  Side Bending 26 35  Rotation 51 55    Unsupported sitting for 10 minutes : able to perform without kyphotic posture    Treatment:   therex Seated: Cervical rotation 10x 5 second holds each direction, cue for not pushing into pain, slow controlled movements.  Cervical side bend to same side 10x; max cueing for keeping head straight and sidebend only; 5 second holds; cues to not push  into pain. requires max visual and demonstrative cueing for performance  Cervical extension and chin tuck 10x with focus on slow controlled movements  Trunk extension 10x 5 second holds     Patient educated on compliance with Hep after discharge to ensure/maintain functional range of motion and decreased pain.    Patient has met majority of goals and has functional range of motion and strength of cervical spine. She is ready for discharge at this time. Patient is aware of need for continued compliance with HEP after discharge and verbalized understanding. I will be happy to see this patient again in the future as needed.                       PT Education - 12/21/20 0751     Education Details discharge, compliance with HEP after discharge    Person(s) Educated Patient    Methods Explanation;Demonstration;Tactile cues;Verbal cues    Comprehension Verbalized understanding;Returned demonstration;Verbal cues required;Tactile cues required              PT Short Term Goals - 10/31/20 0815       PT SHORT TERM GOAL #1   Title After 4 weeks pt will reports less fatigue in her neck, report only requiring the cervical soft collar <4 days per week.    Baseline Feels need for collar most afternoons due to fatigue 5/12: wears 2 days a week 6/21: only wears occasionally    Time 4    Period Weeks    Status Achieved    Target Date 09/14/20      PT SHORT TERM GOAL #2   Title After 4 weeks pt to demonstrate improved 5xSTS <12 seconds and without LOB.    Baseline 13.67sec c LOB on 2nd rep of 6 5/12: 13.08 no LOB 6/21: 12.65 seconds    Time 4    Period Weeks    Status Achieved    Target Date 09/14/20      PT SHORT TERM GOAL #3   Title After 4 weeks pt to demonstrate improve lumbopelvic motor control AEB ability to ATTEMPT SLS multiple times without onset cramping in legs.    Baseline has severe cramping limitations in hamstrings during testing on eval 6/21: able to perform     Time 4    Period Weeks    Status Achieved    Target Date 09/14/20               PT Long Term Goals - 12/21/20 0753       PT LONG TERM GOAL #1   Title After 6 weeks pt to score >80 on FOTO survey to demonstrate improved participation in life.    Baseline 77 at  eval 5/12: 69% 6/21: 85% 6/30: 83% 8/11: 93%    Time 8    Period Weeks    Status Achieved      PT LONG TERM GOAL #2   Title After 8 weeks pt to demonstrate 5xSTS in <12sec eyes closed without LOB to demonstrate improved dynamic motor control.    Baseline eyes open at eval 5/12: eyes open 13.08 6/21: 12.65 seconds 8/11: 11 seconds    Time 8    Period Weeks    Status Achieved      PT LONG TERM GOAL #3   Title After 8 weeks pt to demonstrate improved balance AEB SLS >5sec bilat without use of arms for righting upon failure thereafter.    Baseline <3 sec SLS, no use of arms for righting, but arresting hamstrings cramps bilat 6/21: L 15 seconds R 8 seconds    Time 8    Period Weeks    Status Achieved      PT LONG TERM GOAL #4   Title Patient will reduce Neck Disability Index score to <10% to demonstrate minimal disability with ADL's including improved sleeping tolerance, sitting tolerance, etc for better mobility at home and work.    Baseline 6/21: 21% 6/30: 14% 8/11: 9%    Time 8    Period Weeks    Status Partially Met      PT LONG TERM GOAL #5   Title Patient will demonstrate increased cervical range of motion within 10degrees of normal range to increase functional mobility for iADLs such as driving.    Baseline 6/21: see note 6/30: see note: decrease in rotation and sidebending noted 8/11: see note; increased    Time 8    Period Weeks    Status Partially Met      PT LONG TERM GOAL #6   Title Patient will tolerate sitting unsupported demonstrating erect sitting posture for 10 minutes for postural strengthening and reduction of cervical range of motion and stabilization.    Baseline 6/21: unable to perform  6/30: slight forward head rounded shoulders posturing 8/11: able to tolerate    Time 8    Period Weeks    Status Achieved                   Plan - 12/21/20 0804     Clinical Impression Statement Patient has met majority of goals and has functional range of motion and strength of cervical spine. She is ready for discharge at this time. Patient is aware of need for continued compliance with HEP after discharge and verbalized understanding. I will be happy to see this patient again in the future as needed.    Personal Factors and Comorbidities Age;Sex;Behavior Pattern;Comorbidity 1;Fitness;Time since onset of injury/illness/exacerbation    Comorbidities osteoporosis    Examination-Activity Limitations Bend;Bathing;Dressing;Lift;Reach Overhead;Stand;Stairs    Examination-Participation Restrictions Driving;Community Activity;Yard Work;Cleaning    Stability/Clinical Decision Making Unstable/Unpredictable    Rehab Potential Fair    PT Frequency 2x / week    PT Duration 8 weeks    PT Treatment/Interventions ADLs/Self Care Home Management;Cryotherapy;Electrical Stimulation;Moist Heat;DME Instruction;Gait training;Stair training;Functional mobility training;Therapeutic activities;Therapeutic exercise;Balance training;Neuromuscular re-education;Patient/family education;Passive range of motion    PT Next Visit Plan Discuss plan of PT and limitations of care, particualrly regarding return to driivng. Screen right shoulder moblity/strength for lifting activity at home, cervical extension/flexion A/ROM assessent (already permitted for [employer]); Exhaustive balance screening to address 5+ years of falls and orthopedic insult.    PT Home Exercise Plan  None issued at eval; to be determine.    Consulted and Agree with Plan of Care Patient             Patient will benefit from skilled therapeutic intervention in order to improve the following deficits and impairments:  Decreased balance,  Abnormal gait, Decreased activity tolerance, Decreased knowledge of use of DME, Decreased knowledge of precautions, Decreased mobility, Decreased range of motion, Decreased strength, Hypomobility, Increased muscle spasms, Postural dysfunction, Impaired UE functional use  Visit Diagnosis: Abnormal head movements  Muscle weakness (generalized)     Problem List Patient Active Problem List   Diagnosis Date Noted   Status post reverse total shoulder replacement, right 09/24/2018   S/P right colectomy 01/26/2018   Adenomatous polyp of ascending colon 11/24/2017   Generalized anxiety disorder 09/06/2017   Irritable bowel syndrome (IBS) 09/06/2017   Fracture of one rib, right side, subsequent encounter for fracture with routine healing 03/08/2017   Orthostasis 06/05/2016   Left shoulder pain 10/13/2015   Constipation 05/30/2015   Osteoporosis, postmenopausal 05/30/2015   Compression fracture of L2 lumbar vertebra with delayed healing 05/29/2015   Frequent falls 03/26/2015   Atherosclerosis of arteries 03/26/2015   Chest pain 02/09/2015   Breast cancer screening 10/01/2014   Family history of colon cancer 09/28/2014   Viral URI with cough 06/12/2014   Tenesmus (rectal) 06/12/2014   TMJ (temporomandibular joint syndrome) 06/12/2014   Varicose veins of both lower extremities without ulcer or inflammation 06/12/2014   B12 deficiency 09/20/2013   Fatigue 05/23/2013   Internal hemorrhoids without complication 14/70/9295   Insomnia 03/03/2013   Vertigo due to cerebrovascular disease 03/03/2013   Seronegative rheumatoid arthritis affecting lower leg ([place]) 03/03/2013    Janna Arch, PT, DPT  12/21/2020, 8:18 AM  [place] MAIN [facility] SERVICES [address] [place], [place], [address] Phone: [phone]   Fax:  [phone]  Name: Teresa Lin MRN: [id number] Date of Birth: Oct 13, 1931

## 2020-12-26 ENCOUNTER — Ambulatory Visit: Payer: Medicare Other

## 2020-12-28 ENCOUNTER — Ambulatory Visit: Payer: Medicare Other

## 2021-01-02 ENCOUNTER — Ambulatory Visit: Payer: Medicare Other

## 2021-01-04 ENCOUNTER — Ambulatory Visit: Payer: Medicare Other

## 2021-01-09 ENCOUNTER — Ambulatory Visit: Payer: Medicare Other

## 2021-01-11 ENCOUNTER — Ambulatory Visit: Payer: Medicare Other

## 2021-01-16 ENCOUNTER — Ambulatory Visit: Payer: Medicare Other

## 2021-01-18 ENCOUNTER — Ambulatory Visit: Payer: Medicare Other

## 2021-01-23 ENCOUNTER — Ambulatory Visit: Payer: Medicare Other

## 2021-02-05 ENCOUNTER — Ambulatory Visit: Payer: Medicare Other | Attending: Internal Medicine | Admitting: Occupational Therapy

## 2021-02-05 DIAGNOSIS — M25642 Stiffness of left hand, not elsewhere classified: Secondary | ICD-10-CM | POA: Insufficient documentation

## 2021-02-05 DIAGNOSIS — M79641 Pain in right hand: Secondary | ICD-10-CM | POA: Diagnosis present

## 2021-02-05 DIAGNOSIS — M79642 Pain in left hand: Secondary | ICD-10-CM | POA: Insufficient documentation

## 2021-02-05 DIAGNOSIS — M25531 Pain in right wrist: Secondary | ICD-10-CM | POA: Diagnosis not present

## 2021-02-05 DIAGNOSIS — M25641 Stiffness of right hand, not elsewhere classified: Secondary | ICD-10-CM | POA: Diagnosis present

## 2021-02-05 DIAGNOSIS — M25631 Stiffness of right wrist, not elsewhere classified: Secondary | ICD-10-CM | POA: Insufficient documentation

## 2021-02-05 NOTE — Therapy (Signed)
[facility] [address] [place], [address] Phone: [phone]   Fax:  [phone]  Occupational Therapy Evaluation  Patient Details  Name: Teresa Lin MRN: [id number] Date of Birth: 03-Apr-1932 Referring Provider (OT): Dr Jefm Bryant   Encounter Date: 02/05/2021   OT End of Session - 02/05/21 1250     Visit Number 1    Number of Visits 1    Date for OT Re-Evaluation 02/05/21    OT Start Time 1030    OT Stop Time 1115    OT Time Calculation (min) 45 min    Activity Tolerance Patient tolerated treatment well    Behavior During Therapy [facility] for tasks assessed/performed             Past Medical History:  Diagnosis Date   Anemia    Anxiety    Arthritis    osteoarthritis. Bilateral knee replacement, hands, meniscal tear, cervical disc disease   Atherosclerosis    Atypical chest pain    Collagen vascular disease (HCC)    Colon polyp 12/15/2017   Colon polyps    Compression fracture of L2 (HCC)    Diverticulosis    DVT (deep venous thrombosis) (HCC)    GERD (gastroesophageal reflux disease)    History of seronegative inflammatory arthritis    Hypertension    IBS (irritable bowel syndrome)    Internal hemorrhoids    Phlebitis and thrombophlebitis of deep veins of lower extremities ([place])    after surgery   Vision abnormalities     Past Surgical History:  Procedure Laterality Date   APPENDECTOMY     COLONOSCOPY N/A 01/24/2020   Procedure: COLONOSCOPY;  Surgeon: Lesly Rubenstein, MD;  Location: [facility] ENDOSCOPY;  Service: Endoscopy;  Laterality: N/A;   COLONOSCOPY WITH PROPOFOL N/A 10/24/2017   Procedure: COLONOSCOPY WITH PROPOFOL;  Surgeon: Manya Silvas, MD;  Location: [facility] ENDOSCOPY;  Service: Endoscopy;  Laterality: N/A;   COLONOSCOPY WITH PROPOFOL N/A 11/21/2017   Procedure: COLONOSCOPY WITH PROPOFOL;  Surgeon: Manya Silvas, MD;  Location: [facility] ENDOSCOPY;  Service: Endoscopy;  Laterality:  N/A;   DILATION AND CURETTAGE OF UTERUS     ESOPHAGOGASTRODUODENOSCOPY (EGD) WITH PROPOFOL N/A 02/25/2018   Procedure: ESOPHAGOGASTRODUODENOSCOPY (EGD) WITH PROPOFOL;  Surgeon: Manya Silvas, MD;  Location: [facility] ENDOSCOPY;  Service: Endoscopy;  Laterality: N/A;   EYE SURGERY     bilateral catarACT   JOINT REPLACEMENT Bilateral    knee replacement   LAPAROSCOPIC RIGHT COLECTOMY Right 12/15/2017   Procedure: LAPAROSCOPIC RIGHT COLECTOMY;  Surgeon: Robert Bellow, MD;  Location: [facility] ORS;  Service: General;  Laterality: Right;   REPLACEMENT TOTAL KNEE BILATERAL     TOTAL SHOULDER ARTHROPLASTY Right 09/24/2018   Procedure: TOTAL SHOULDER ARTHROPLASTY - REVERSE RIGHT;  Surgeon: Corky Mull, MD;  Location: [facility] ORS;  Service: Orthopedics;  Laterality: Right;    There were no vitals filed for this visit.   Subjective Assessment - 02/05/21 1242     Subjective  Since I have seen you last year I broked my R shoulder and my neck - doing better- but my hands still stiffness and pain when making fist- flare up in my R wrist since last week- usually can get it better in 3 days- Do not want to take Prednisone- want to make sure if  I am still doing okay    Pertinent History 12/19/20 seen Dr Jefm Bryant - . Hands with hypertrophic change thumb CMC and DIPs  PIPs. MCPs minimally tender. Hips move well. No knee effusions both of been operated. Left ankle synovitis. Right ankle moves well      Assessment:  Seronegative rheumatoid arthritis. Ankle flare.  a. Prednisone    b.  Temporal artery biopsy negative.    c.  Methotrexate.    d.  Remicade s/p reaction.    e.  Plaquenil.    f.  Orencia     Cervical disc disease  Right shoulder replacement  Osteoporosis  -Shoulder fracture  -Alendronate  -Cervical fracture  Pulmonary embolism  Microscopic hematuria    Plan:  Offered to inject. She would like to see if infusion helps will stay with her as needed Advil in addition. Declines prednisone taper  Hand therapy   Infusion today  Reviewed labs. Same methotrexate and Orencia    Patient Stated Goals Just want to check and see if I am doing okay with my range of motion , strength in my hands compare to in the past    Currently in Pain? Yes    Pain Score 6     Pain Location Hand    Pain Orientation Right;Left    Pain Descriptors / Indicators Aching;Tightness    Pain Type Chronic pain    Pain Onset More than a month ago    Pain Frequency Constant               [facility] OT Assessment - 02/05/21 0001       Assessment   Medical Diagnosis RA with bilateral hand pain and stiffness    Referring Provider (OT) Dr Jefm Bryant    Onset Date/Surgical Date 07/12/19    Hand Dominance Right      Home  Environment   Lives With Alone      Prior Function   Vocation Retired    Leisure Read, some cooking , like sports      AROM   Right Wrist Extension 35 Degrees    Right Wrist Flexion 50 Degrees    Right Wrist Radial Deviation 0 Degrees    Right Wrist Ulnar Deviation 25 Degrees    Left Wrist Extension 55 Degrees    Left Wrist Flexion 85 Degrees    Left Wrist Radial Deviation 25 Degrees    Left Wrist Ulnar Deviation 30 Degrees      Strength   Right Hand Grip (lbs) 26    Right Hand Lateral Pinch 12 lbs    Right Hand 3 Point Pinch 9 lbs    Left Hand Grip (lbs) 40    Left Hand Lateral Pinch 10 lbs    Left Hand 3 Point Pinch 11 lbs      Right Hand AROM   R Index  MCP 0-90 70 Degrees    R Index PIP 0-100 92 Degrees    R Long  MCP 0-90 90 Degrees    R Long PIP 0-100 55 Degrees    R Ring  MCP 0-90 90 Degrees    R Ring PIP 0-100 100 Degrees    R Little  MCP 0-90 90 Degrees    R Little PIP 0-100 95 Degrees      Left Hand AROM   L Index  MCP 0-90 70 Degrees    L Index PIP 0-100 100 Degrees    L Long  MCP 0-90 90 Degrees    L Long PIP 0-100 100 Degrees    L Ring  MCP 0-90 90 Degrees    L Ring PIP 0-100 100  Degrees    L Little  MCP 0-90 90 Degrees    L Little PIP 0-100 90 Degrees                               OT Education - 02/05/21 1250     Education Details findings of eval , compare to 1 1/2 yrs ago -and recommendations    Person(s) Educated Patient    Methods Explanation;Demonstration;Tactile cues;Verbal cues;Handout    Comprehension Verbal cues required;Returned demonstration;Verbalized understanding                 OT Long Term Goals - 02/05/21 1255       OT LONG TERM GOAL #1   Title Pt verbalize understanding of homeprogram for joint protection , modifications, splint wearing and community resources    Baseline met - pt aware and written info provided    Time 1    Period Days    Status Achieved    Target Date 02/05/21                   Plan - 02/05/21 1251     Clinical Impression Statement Pt refer to OT with diagnosis of RA with bilateral hand pain ans stiffness -pt was seen by this OT in 2020 and 2021 - compare to last year measurments -pt doing well with her AROM for bilateral wrist and hands - only one that decease is her RD on the R , L wrist ext by less than 5 degrees. Grip same , 3 point decrease on the R, L lat grip by 2 lbs - pt do have flare up of the R wrist -and pain 6/10 - reminded pt on wearing soft splint as needed during flareup and doing contrast. Review again joint protection principles. Discuss community resources for her for balance, socialization - pt mention with COVID a lot of her friends past away. Pt use to do the exercise class at the [facility] - pt to get clearance from her MD for that - pt to follow up as needed with this OT    OT Occupational Profile and History Problem Focused Assessment - Including review of records relating to presenting problem    Occupational performance deficits (Please refer to evaluation for details): ADL's;IADL's    Rehab Potential Good    Clinical Decision Making Limited treatment options, no task modification necessary    Comorbidities Affecting Occupational Performance: None     Modification or Assistance to Complete Evaluation  No modification of tasks or assist necessary to complete eval    OT Frequency One time visit    OT Treatment/Interventions Self-care/ADL training;Splinting;Therapeutic exercise;Patient/family education    Consulted and Agree with Plan of Care Patient             Patient will benefit from skilled therapeutic intervention in order to improve the following deficits and impairments:           Visit Diagnosis: Pain in right wrist - Plan: Ot plan of care cert/re-cert  Stiffness of right wrist, not elsewhere classified - Plan: Ot plan of care cert/re-cert  Stiffness of left hand, not elsewhere classified - Plan: Ot plan of care cert/re-cert  Stiffness of right hand, not elsewhere classified - Plan: Ot plan of care cert/re-cert  Pain in both hands - Plan: Ot plan of care cert/re-cert    Problem List Patient Active Problem List   Diagnosis Date Noted  Status post reverse total shoulder replacement, right 09/24/2018   S/P right colectomy 01/26/2018   Adenomatous polyp of ascending colon 11/24/2017   Generalized anxiety disorder 09/06/2017   Irritable bowel syndrome (IBS) 09/06/2017   Fracture of one rib, right side, subsequent encounter for fracture with routine healing 03/08/2017   Orthostasis 06/05/2016   Left shoulder pain 10/13/2015   Constipation 05/30/2015   Osteoporosis, postmenopausal 05/30/2015   Compression fracture of L2 lumbar vertebra with delayed healing 05/29/2015   Frequent falls 03/26/2015   Atherosclerosis of arteries 03/26/2015   Chest pain 02/09/2015   Breast cancer screening 10/01/2014   Family history of colon cancer 09/28/2014   Viral URI with cough 06/12/2014   Tenesmus (rectal) 06/12/2014   TMJ (temporomandibular joint syndrome) 06/12/2014   Varicose veins of both lower extremities without ulcer or inflammation 06/12/2014   B12 deficiency 09/20/2013   Fatigue 05/23/2013   Internal hemorrhoids  without complication 12/23/8869   Insomnia 03/03/2013   Vertigo due to cerebrovascular disease 03/03/2013   Seronegative rheumatoid arthritis affecting lower leg ([place]) 03/03/2013    Rosalyn Gess, OTR/L,CLT 02/05/2021, 12:58 PM  [facility] [address] [place], [address] Phone: [phone]   Fax:  [phone]  Name: ASTIN RAPE MRN: [id number] Date of Birth: 1931/10/11

## 2021-02-27 ENCOUNTER — Other Ambulatory Visit: Payer: Self-pay | Admitting: Neurology

## 2021-02-27 DIAGNOSIS — M542 Cervicalgia: Secondary | ICD-10-CM

## 2021-03-14 ENCOUNTER — Other Ambulatory Visit: Payer: Self-pay

## 2021-03-14 ENCOUNTER — Ambulatory Visit
Admission: RE | Admit: 2021-03-14 | Discharge: 2021-03-14 | Disposition: A | Discharge status: Home / Self Care | Payer: Medicare Other | Source: Ambulatory Visit | Attending: Neurology | Admitting: Neurology

## 2021-03-14 DIAGNOSIS — M542 Cervicalgia: Secondary | ICD-10-CM | POA: Diagnosis not present

## 2021-03-28 ENCOUNTER — Other Ambulatory Visit (HOSPITAL_COMMUNITY): Payer: Self-pay | Admitting: Internal Medicine

## 2021-03-28 ENCOUNTER — Other Ambulatory Visit: Payer: Self-pay | Admitting: Internal Medicine

## 2021-03-28 DIAGNOSIS — E041 Nontoxic single thyroid nodule: Secondary | ICD-10-CM

## 2021-04-03 ENCOUNTER — Other Ambulatory Visit: Payer: Self-pay

## 2021-04-03 ENCOUNTER — Ambulatory Visit
Admission: RE | Admit: 2021-04-03 | Discharge: 2021-04-03 | Disposition: A | Discharge status: Home / Self Care | Payer: Medicare Other | Source: Ambulatory Visit | Attending: Internal Medicine | Admitting: Internal Medicine

## 2021-04-03 DIAGNOSIS — E041 Nontoxic single thyroid nodule: Secondary | ICD-10-CM | POA: Diagnosis not present

## 2021-04-24 DIAGNOSIS — M0609 Rheumatoid arthritis without rheumatoid factor, multiple sites: Secondary | ICD-10-CM | POA: Diagnosis present

## 2021-05-03 ENCOUNTER — Encounter (HOSPITAL_COMMUNITY): Payer: Self-pay | Admitting: Radiology

## 2021-05-29 ENCOUNTER — Emergency Department
Admission: EM | Admit: 2021-05-29 | Discharge: 2021-05-29 | Disposition: A | Discharge status: Home / Self Care | Payer: Medicare Other | Attending: Student in an Organized Health Care Education/Training Program | Admitting: Student in an Organized Health Care Education/Training Program

## 2021-05-29 ENCOUNTER — Emergency Department: Payer: Medicare Other

## 2021-05-29 ENCOUNTER — Other Ambulatory Visit: Payer: Self-pay | Admitting: Physician Assistant

## 2021-05-29 ENCOUNTER — Encounter: Payer: Self-pay | Admitting: Emergency Medicine

## 2021-05-29 ENCOUNTER — Other Ambulatory Visit
Admission: RE | Admit: 2021-05-29 | Discharge: 2021-05-29 | Disposition: A | Discharge status: Home / Self Care | Payer: Medicare Other | Source: Ambulatory Visit | Attending: Physician Assistant | Admitting: Physician Assistant

## 2021-05-29 ENCOUNTER — Other Ambulatory Visit: Payer: Self-pay

## 2021-05-29 DIAGNOSIS — U071 COVID-19: Secondary | ICD-10-CM | POA: Insufficient documentation

## 2021-05-29 DIAGNOSIS — R079 Chest pain, unspecified: Secondary | ICD-10-CM

## 2021-05-29 DIAGNOSIS — I82431 Acute embolism and thrombosis of right popliteal vein: Secondary | ICD-10-CM | POA: Diagnosis not present

## 2021-05-29 DIAGNOSIS — I82451 Acute embolism and thrombosis of right peroneal vein: Secondary | ICD-10-CM

## 2021-05-29 DIAGNOSIS — R0789 Other chest pain: Secondary | ICD-10-CM | POA: Diagnosis not present

## 2021-05-29 LAB — CBC WITH DIFFERENTIAL/PLATELET
Abs Immature Granulocytes: 0.02 10*3/uL (ref 0.00–0.07)
Basophils Absolute: 0 10*3/uL (ref 0.0–0.1)
Basophils Relative: 0 %
Eosinophils Absolute: 0 10*3/uL (ref 0.0–0.5)
Eosinophils Relative: 0 %
HCT: 41.6 % (ref 36.0–46.0)
Hemoglobin: 13.9 g/dL (ref 12.0–15.0)
Immature Granulocytes: 0 %
Lymphocytes Relative: 14 %
Lymphs Abs: 0.7 10*3/uL (ref 0.7–4.0)
MCH: 32 pg (ref 26.0–34.0)
MCHC: 33.4 g/dL (ref 30.0–36.0)
MCV: 95.9 fL (ref 80.0–100.0)
Monocytes Absolute: 0.7 10*3/uL (ref 0.1–1.0)
Monocytes Relative: 13 %
Neutro Abs: 3.7 10*3/uL (ref 1.7–7.7)
Neutrophils Relative %: 73 %
Platelets: 216 10*3/uL (ref 150–400)
RBC: 4.34 MIL/uL (ref 3.87–5.11)
RDW: 13.7 % (ref 11.5–15.5)
WBC: 5.1 10*3/uL (ref 4.0–10.5)
nRBC: 0 % (ref 0.0–0.2)

## 2021-05-29 LAB — LIPASE, BLOOD: Lipase: 61 U/L — ABNORMAL HIGH (ref 11–51)

## 2021-05-29 LAB — URINALYSIS, COMPLETE (UACMP) WITH MICROSCOPIC
Bacteria, UA: NONE SEEN
Bilirubin Urine: NEGATIVE
Glucose, UA: NEGATIVE mg/dL
Nitrite: NEGATIVE
Protein, ur: NEGATIVE mg/dL
Specific Gravity, Urine: 1.01 (ref 1.005–1.030)
pH: 6.5 (ref 5.0–8.0)

## 2021-05-29 LAB — HEPATIC FUNCTION PANEL
ALT: 22 U/L (ref 0–44)
AST: 36 U/L (ref 15–41)
Albumin: 3.9 g/dL (ref 3.5–5.0)
Alkaline Phosphatase: 47 U/L (ref 38–126)
Bilirubin, Direct: <0.1 mg/dL (ref 0.0–0.2)
Total Bilirubin: 0.5 mg/dL (ref 0.3–1.2)
Total Protein: 6.7 g/dL (ref 6.5–8.1)

## 2021-05-29 LAB — RESP PANEL BY RT-PCR (FLU A&B, COVID) ARPGX2
Influenza A by PCR: NEGATIVE
Influenza B by PCR: NEGATIVE
SARS Coronavirus 2 by RT PCR: POSITIVE — AB

## 2021-05-29 LAB — BASIC METABOLIC PANEL
Anion gap: 10 (ref 5–15)
BUN: 15 mg/dL (ref 8–23)
CO2: 26 mmol/L (ref 22–32)
Calcium: 8.7 mg/dL — ABNORMAL LOW (ref 8.9–10.3)
Chloride: 97 mmol/L — ABNORMAL LOW (ref 98–111)
Creatinine, Ser: 0.48 mg/dL (ref 0.44–1.00)
GFR, Estimated: >60 mL/min (ref >60)
Glucose, Bld: 98 mg/dL (ref 70–99)
Potassium: 3.9 mmol/L (ref 3.5–5.1)
Sodium: 133 mmol/L — ABNORMAL LOW (ref 135–145)

## 2021-05-29 LAB — TROPONIN I (HIGH SENSITIVITY): Troponin I (High Sensitivity): 14 ng/L (ref <18)

## 2021-05-29 LAB — D-DIMER, QUANTITATIVE: D-Dimer, Quant: 2.31 ug/mL-FEU — ABNORMAL HIGH (ref 0.00–0.50)

## 2021-05-29 MED ORDER — IOHEXOL 350 MG/ML SOLN
75.0000 mL | Freq: Once | INTRAVENOUS | Status: AC | PRN
  Administered 2021-05-29: 75 mL via INTRAVENOUS

## 2021-05-29 MED ORDER — ACETAMINOPHEN 325 MG PO TABS
650.0000 mg | ORAL_TABLET | Freq: Once | ORAL | Status: AC
  Administered 2021-05-29: 650 mg via ORAL
  Filled 2021-05-29: qty 2

## 2021-05-29 MED ORDER — MOLNUPIRAVIR EUA 200MG CAPSULE: 4.0000 | ORAL_CAPSULE | Freq: Two times a day (BID) | ORAL | 0 refills | Status: AC

## 2021-05-29 NOTE — ED Notes (Signed)
C/o intermittent L sided chest pain, shoulder, upper arm pain started a few days previous to Friday. In clinic today, elevated D dimer. Sent to the ER for PE work-up due to previous PE hx. Fall in shower yesterday due to slippery in the shower. Neck pain earlier today, states from pulling herself up off the shower floor. Took 1.5 hrs. No current illness. Denies feeling SOB. S/s seem to per similar to her previous event in 2021.

## 2021-05-29 NOTE — ED Triage Notes (Signed)
Pt to ED via POV, with c/o positive D-Dimer from [facility], she has had a PE in the past they sent her over here to R/O a blood clot.

## 2021-05-29 NOTE — Discharge Instructions (Addendum)
A prescription for Molnupiravir has been sent to your pharmacy.  Please make sure that your pharmacist does not identify any medication interactions that I may not have identified here today.  Please take the eliquis as prescribed.  Return for any additional questions or concerns.

## 2021-05-29 NOTE — ED Provider Notes (Addendum)
[facility] Provider Note    Event Date/Time   First MD Initiated Contact with Patient 05/29/21 2021     (approximate)   History   No chief complaint on file.   HPI  Teresa Lin is a 88 y.o. female with a history of DVT PE presents to the ER for evaluation saying his chest.  States she followed up with her PCP clinic today and on review of note and had work-up including D-dimer which was ordered and was found to be elevated given her history presents to ER for further evaluation.  Has not had any fevers until coming to the ER.  Denies any abdominal pain.  No nausea or vomiting.     Physical Exam   Triage Vital Signs: ED Triage Vitals  Enc Vitals Group     BP 05/29/21 1831 (!) 145/78     Pulse Rate 05/29/21 1831 70     Resp 05/29/21 1831 20     Temp 05/29/21 1831 (!) 100.8 F (38.2 C)     Temp Source 05/29/21 1831 Oral     SpO2 05/29/21 1831 96 %     Weight 05/29/21 1911 140 lb (63.5 kg)     Height 05/29/21 1911 5\' 5"  (1.651 m)     Head Circumference --      Peak Flow --      Pain Score 05/29/21 1911 8     Pain Loc --      Pain Edu? --      Excl. in [place]? --     Most recent vital signs: Vitals:   05/29/21 2215 05/29/21 2330  BP:  (!) 143/72  Pulse: 82 63  Resp: (!) 21 18  Temp:    SpO2: 98% 97%     Constitutional: Alert  Eyes: Conjunctivae are normal.  Head: Atraumatic. Nose: No congestion/rhinnorhea. Mouth/Throat: Mucous membranes are moist.   Neck: Painless ROM.  Cardiovascular:   Good peripheral circulation. Respiratory: Normal respiratory effort.  No retractions.  Gastrointestinal: Soft and nontender.  Musculoskeletal:  no deformity Neurologic:  MAE spontaneously. No gross focal neurologic deficits are appreciated.  Skin:  Skin is warm, dry and intact. No rash noted. Psychiatric: Mood and affect are normal. Speech and behavior are normal.    ED Results / Procedures / Treatments   Labs (all labs ordered are listed, but  only abnormal results are displayed) Labs Reviewed  RESP PANEL BY RT-PCR (FLU A&B, COVID) ARPGX2 - Abnormal; Notable for the following components:      Result Value   SARS Coronavirus 2 by RT PCR POSITIVE (*)    All other components within normal limits  BASIC METABOLIC PANEL - Abnormal; Notable for the following components:   Sodium 133 (*)    Chloride 97 (*)    Calcium 8.7 (*)    All other components within normal limits  URINALYSIS, COMPLETE (UACMP) WITH MICROSCOPIC - Abnormal; Notable for the following components:   Hgb urine dipstick MODERATE (*)    Ketones, ur TRACE (*)    Leukocytes,Ua SMALL (*)    All other components within normal limits  LIPASE, BLOOD - Abnormal; Notable for the following components:   Lipase 61 (*)    All other components within normal limits  CBC WITH DIFFERENTIAL/PLATELET  HEPATIC FUNCTION PANEL  TROPONIN I (HIGH SENSITIVITY)  TROPONIN I (HIGH SENSITIVITY)     EKG  ED ECG REPORT I, Merlyn Lot, the attending physician, personally viewed and interpreted this ECG.  Date: 05/29/2021  EKG Time: 22:01  Rate: 75  Rhythm: sinus  Axis: normal  Intervals:rbbb  ST&T Change: nonspecific st abn, no stemi    RADIOLOGY Please see ED Course for my review and interpretation.  I personally reviewed all radiographic images ordered to evaluate for the above acute complaints and reviewed radiology reports and findings.  These findings were personally discussed with the patient.  Please see medical record for radiology report.    PROCEDURES:  Critical Care performed:   Procedures   MEDICATIONS ORDERED IN ED: Medications  iohexol (OMNIPAQUE) 350 MG/ML injection 75 mL (75 mLs Intravenous Contrast Given 05/29/21 2050)  acetaminophen (TYLENOL) tablet 650 mg (650 mg Oral Given 05/29/21 2224)     IMPRESSION / MDM / ASSESSMENT AND PLAN / ED COURSE  I reviewed the triage vital signs and the nursing notes.                               Differential diagnosis includes, but is not limited to, ACS, pericarditis, esophagitis, boerhaaves, pe, dissection, pna, bronchitis, costochondritis  Patient found to have COVID.  CTA was ordered in triage due to concern for DVT given her history of elevated D-dimer.  Based on PE.  Her EKG is nonischemic symptoms do not seem consistent with ACS configuration.  Troponin is negative.  No white count or significant anemia.  She is mildly febrile give Tylenol.  Will order lower extremity ultrasound and observe on monitor for possible admission given her age however the patient does appear completely well.   Clinical Course as of 05/29/21 2347  Tue May 29, 2021  2109 CT by my review does not show any evidence of saddle or large PE [PR]  2157 CTA without evidence of PE.  She is COVID-positive.  She does not have any hypoxia.  Currently pain-free.  Ultrasound does show evidence of right pleuritic thrombus.  Instructed to restart Eliquis that she does not have any Contraindications.  Given her well appearance I do not feel that she requires hospitalization.  Patient.  Discussed recent medical examination and treatment for more.  She is not a candidate exploded being anticoagulated.  Prescription sent to her pharmacy.  Discussed strict return precautions. [PR]    Clinical Course User Index [PR] Merlyn Lot, MD     FINAL CLINICAL IMPRESSION(S) / ED DIAGNOSES   Final diagnoses:  Acute deep vein thrombosis (DVT) of right peroneal vein ([place])  COVID-19     Rx / DC Orders   ED Discharge Orders          Ordered    molnupiravir EUA (LAGEVRIO) 200 mg CAPS capsule  2 times daily        05/29/21 2337             Note:  This document was prepared using Dragon voice recognition software and may include unintentional dictation errors.     Merlyn Lot, MD 05/29/21 [id number]

## 2021-05-29 NOTE — ED Provider Triage Note (Signed)
Emergency Medicine Provider Triage Evaluation Note  Teresa Lin , a 88 y.o. female  was evaluated in triage.  Pt complains of chest wall pain. She was evaluated today by PCP. D-dimer positive. History of PE. No longer on Eliquis. Denies shortness of breath.  Review of Systems  Positive: Chest wall pain Negative: Cough  Physical Exam  BP (!) 145/78 (BP Location: Right Arm)    Pulse 70    Temp (!) 100.8 F (38.2 C) (Oral)    Resp 20    SpO2 96%  Gen:   Awake, no distress   Resp:  Normal effort  MSK:   Moves extremities without difficulty  Other:    Medical Decision Making  Medically screening exam initiated at 7:08 PM.  Appropriate orders placed.  LINDLEY STACHNIK was informed that the remainder of the evaluation will be completed by another provider, this initial triage assessment does not replace that evaluation, and the importance of remaining in the ED until their evaluation is complete.   Victorino Dike, FNP 05/29/21 1936

## 2021-05-30 ENCOUNTER — Ambulatory Visit: Payer: Medicare Other

## 2021-06-06 DIAGNOSIS — Z86718 Personal history of other venous thrombosis and embolism: Secondary | ICD-10-CM | POA: Insufficient documentation

## 2021-08-08 ENCOUNTER — Emergency Department
Admission: EM | Admit: 2021-08-08 | Discharge: 2021-08-08 | DRG: 563 | Disposition: A | Discharge status: Other Acute Inpatient Hospital | Payer: Medicare Other | Attending: Emergency Medicine | Admitting: Emergency Medicine

## 2021-08-08 ENCOUNTER — Emergency Department: Payer: Medicare Other

## 2021-08-08 ENCOUNTER — Other Ambulatory Visit: Payer: Self-pay

## 2021-08-08 ENCOUNTER — Inpatient Hospital Stay (HOSPITAL_COMMUNITY)
Admission: AD | Admit: 2021-08-08 | Discharge: 2021-08-13 | DRG: 493 | Disposition: A | Discharge status: Home Health | Payer: Medicare Other | Source: Other Acute Inpatient Hospital | Attending: Internal Medicine | Admitting: Internal Medicine

## 2021-08-08 ENCOUNTER — Inpatient Hospital Stay (HOSPITAL_COMMUNITY): Payer: Medicare Other

## 2021-08-08 ENCOUNTER — Encounter (HOSPITAL_COMMUNITY): Payer: Self-pay | Admitting: Internal Medicine

## 2021-08-08 DIAGNOSIS — Z800 Family history of malignant neoplasm of digestive organs: Secondary | ICD-10-CM | POA: Diagnosis not present

## 2021-08-08 DIAGNOSIS — Z23 Encounter for immunization: Secondary | ICD-10-CM | POA: Diagnosis not present

## 2021-08-08 DIAGNOSIS — S4991XA Unspecified injury of right shoulder and upper arm, initial encounter: Secondary | ICD-10-CM | POA: Diagnosis present

## 2021-08-08 DIAGNOSIS — Y92009 Unspecified place in unspecified non-institutional (private) residence as the place of occurrence of the external cause: Secondary | ICD-10-CM

## 2021-08-08 DIAGNOSIS — Z96611 Presence of right artificial shoulder joint: Secondary | ICD-10-CM | POA: Diagnosis present

## 2021-08-08 DIAGNOSIS — D849 Immunodeficiency, unspecified: Secondary | ICD-10-CM | POA: Diagnosis present

## 2021-08-08 DIAGNOSIS — Z7901 Long term (current) use of anticoagulants: Secondary | ICD-10-CM | POA: Insufficient documentation

## 2021-08-08 DIAGNOSIS — Z79631 Long term (current) use of antimetabolite agent: Secondary | ICD-10-CM

## 2021-08-08 DIAGNOSIS — F419 Anxiety disorder, unspecified: Secondary | ICD-10-CM | POA: Diagnosis present

## 2021-08-08 DIAGNOSIS — R296 Repeated falls: Secondary | ICD-10-CM | POA: Diagnosis present

## 2021-08-08 DIAGNOSIS — M0609 Rheumatoid arthritis without rheumatoid factor, multiple sites: Secondary | ICD-10-CM | POA: Diagnosis present

## 2021-08-08 DIAGNOSIS — W010XXA Fall on same level from slipping, tripping and stumbling without subsequent striking against object, initial encounter: Secondary | ICD-10-CM | POA: Insufficient documentation

## 2021-08-08 DIAGNOSIS — Z9841 Cataract extraction status, right eye: Secondary | ICD-10-CM

## 2021-08-08 DIAGNOSIS — S42301B Unspecified fracture of shaft of humerus, right arm, initial encounter for open fracture: Principal | ICD-10-CM

## 2021-08-08 DIAGNOSIS — D84821 Immunodeficiency due to drugs: Secondary | ICD-10-CM | POA: Diagnosis present

## 2021-08-08 DIAGNOSIS — Z888 Allergy status to other drugs, medicaments and biological substances status: Secondary | ICD-10-CM | POA: Diagnosis not present

## 2021-08-08 DIAGNOSIS — D649 Anemia, unspecified: Secondary | ICD-10-CM | POA: Diagnosis not present

## 2021-08-08 DIAGNOSIS — Z820 Family history of epilepsy and other diseases of the nervous system: Secondary | ICD-10-CM | POA: Diagnosis not present

## 2021-08-08 DIAGNOSIS — Z86711 Personal history of pulmonary embolism: Secondary | ICD-10-CM

## 2021-08-08 DIAGNOSIS — I10 Essential (primary) hypertension: Secondary | ICD-10-CM | POA: Insufficient documentation

## 2021-08-08 DIAGNOSIS — K219 Gastro-esophageal reflux disease without esophagitis: Secondary | ICD-10-CM | POA: Diagnosis present

## 2021-08-08 DIAGNOSIS — Z79899 Other long term (current) drug therapy: Secondary | ICD-10-CM

## 2021-08-08 DIAGNOSIS — R269 Unspecified abnormalities of gait and mobility: Secondary | ICD-10-CM | POA: Diagnosis present

## 2021-08-08 DIAGNOSIS — Z9842 Cataract extraction status, left eye: Secondary | ICD-10-CM

## 2021-08-08 DIAGNOSIS — E876 Hypokalemia: Secondary | ICD-10-CM | POA: Diagnosis present

## 2021-08-08 DIAGNOSIS — Z8249 Family history of ischemic heart disease and other diseases of the circulatory system: Secondary | ICD-10-CM | POA: Diagnosis not present

## 2021-08-08 DIAGNOSIS — W19XXXA Unspecified fall, initial encounter: Secondary | ICD-10-CM

## 2021-08-08 DIAGNOSIS — Z20822 Contact with and (suspected) exposure to covid-19: Secondary | ICD-10-CM | POA: Insufficient documentation

## 2021-08-08 DIAGNOSIS — M80821A Other osteoporosis with current pathological fracture, right humerus, initial encounter for fracture: Principal | ICD-10-CM | POA: Diagnosis present

## 2021-08-08 DIAGNOSIS — Z7982 Long term (current) use of aspirin: Secondary | ICD-10-CM

## 2021-08-08 DIAGNOSIS — W1830XA Fall on same level, unspecified, initial encounter: Secondary | ICD-10-CM | POA: Diagnosis present

## 2021-08-08 DIAGNOSIS — Z8672 Personal history of thrombophlebitis: Secondary | ICD-10-CM

## 2021-08-08 DIAGNOSIS — S42471B Displaced transcondylar fracture of right humerus, initial encounter for open fracture: Secondary | ICD-10-CM | POA: Diagnosis present

## 2021-08-08 DIAGNOSIS — S42401B Unspecified fracture of lower end of right humerus, initial encounter for open fracture: Secondary | ICD-10-CM | POA: Insufficient documentation

## 2021-08-08 DIAGNOSIS — Z96653 Presence of artificial knee joint, bilateral: Secondary | ICD-10-CM | POA: Diagnosis present

## 2021-08-08 DIAGNOSIS — Z86718 Personal history of other venous thrombosis and embolism: Secondary | ICD-10-CM

## 2021-08-08 DIAGNOSIS — D62 Acute posthemorrhagic anemia: Secondary | ICD-10-CM | POA: Diagnosis not present

## 2021-08-08 DIAGNOSIS — S42421A Displaced comminuted supracondylar fracture without intercondylar fracture of right humerus, initial encounter for closed fracture: Secondary | ICD-10-CM | POA: Diagnosis present

## 2021-08-08 DIAGNOSIS — S42491B Other displaced fracture of lower end of right humerus, initial encounter for open fracture: Secondary | ICD-10-CM | POA: Diagnosis not present

## 2021-08-08 HISTORY — DX: Unspecified hearing loss, unspecified ear: H91.90

## 2021-08-08 LAB — CBC WITH DIFFERENTIAL/PLATELET
Abs Immature Granulocytes: 0.04 10*3/uL (ref 0.00–0.07)
Basophils Absolute: 0 10*3/uL (ref 0.0–0.1)
Basophils Relative: 0 %
Eosinophils Absolute: 0 10*3/uL (ref 0.0–0.5)
Eosinophils Relative: 0 %
HCT: 41.3 % (ref 36.0–46.0)
Hemoglobin: 13.3 g/dL (ref 12.0–15.0)
Immature Granulocytes: 0 %
Lymphocytes Relative: 8 %
Lymphs Abs: 0.7 10*3/uL (ref 0.7–4.0)
MCH: 31.2 pg (ref 26.0–34.0)
MCHC: 32.2 g/dL (ref 30.0–36.0)
MCV: 96.9 fL (ref 80.0–100.0)
Monocytes Absolute: 0.5 10*3/uL (ref 0.1–1.0)
Monocytes Relative: 6 %
Neutro Abs: 7.7 10*3/uL (ref 1.7–7.7)
Neutrophils Relative %: 86 %
Platelets: 292 10*3/uL (ref 150–400)
RBC: 4.26 MIL/uL (ref 3.87–5.11)
RDW: 13.7 % (ref 11.5–15.5)
WBC: 9 10*3/uL (ref 4.0–10.5)
nRBC: 0 % (ref 0.0–0.2)

## 2021-08-08 LAB — RESP PANEL BY RT-PCR (FLU A&B, COVID) ARPGX2
Influenza A by PCR: NEGATIVE
Influenza B by PCR: NEGATIVE
SARS Coronavirus 2 by RT PCR: NEGATIVE

## 2021-08-08 LAB — BASIC METABOLIC PANEL
Anion gap: 11 (ref 5–15)
BUN: 18 mg/dL (ref 8–23)
CO2: 26 mmol/L (ref 22–32)
Calcium: 8.8 mg/dL — ABNORMAL LOW (ref 8.9–10.3)
Chloride: 100 mmol/L (ref 98–111)
Creatinine, Ser: 0.63 mg/dL (ref 0.44–1.00)
GFR, Estimated: >60 mL/min (ref >60)
Glucose, Bld: 143 mg/dL — ABNORMAL HIGH (ref 70–99)
Potassium: 3.8 mmol/L (ref 3.5–5.1)
Sodium: 137 mmol/L (ref 135–145)

## 2021-08-08 LAB — PROTIME-INR
INR: 1.1 (ref 0.8–1.2)
Prothrombin Time: 14.3 seconds (ref 11.4–15.2)

## 2021-08-08 MED ORDER — CEFAZOLIN SODIUM-DEXTROSE 2-4 GM/100ML-% IV SOLN
2.0000 g | Freq: Once | INTRAVENOUS | Status: AC
  Administered 2021-08-08: 2 g via INTRAVENOUS
  Filled 2021-08-08: qty 100

## 2021-08-08 MED ORDER — OXYCODONE HCL 5 MG PO TABS
2.5000 mg | ORAL_TABLET | ORAL | Status: DC | PRN
  Filled 2021-08-08 (×2): qty 1

## 2021-08-08 MED ORDER — LACTATED RINGERS IV SOLN: INTRAVENOUS | Status: DC

## 2021-08-08 MED ORDER — FOLIC ACID 1 MG PO TABS
2.0000 mg | ORAL_TABLET | Freq: Every day | ORAL | Status: DC
  Administered 2021-08-10 – 2021-08-13 (×4): 2 mg via ORAL
  Filled 2021-08-08 (×4): qty 2

## 2021-08-08 MED ORDER — FENTANYL CITRATE PF 50 MCG/ML IJ SOSY
25.0000 ug | PREFILLED_SYRINGE | Freq: Once | INTRAMUSCULAR | Status: AC
  Administered 2021-08-08: 25 ug via INTRAVENOUS

## 2021-08-08 MED ORDER — TETANUS-DIPHTH-ACELL PERTUSSIS 5-2.5-18.5 LF-MCG/0.5 IM SUSY
0.5000 mL | PREFILLED_SYRINGE | Freq: Once | INTRAMUSCULAR | Status: AC
  Administered 2021-08-08: 0.5 mL via INTRAMUSCULAR
  Filled 2021-08-08: qty 0.5

## 2021-08-08 MED ORDER — MELATONIN 5 MG PO TABS
10.0000 mg | ORAL_TABLET | Freq: Every evening | ORAL | Status: DC | PRN
Start: 2021-08-08 — End: 2021-08-13
  Administered 2021-08-08 – 2021-08-12 (×4): 10 mg via ORAL
  Filled 2021-08-08 (×6): qty 2

## 2021-08-08 MED ORDER — ONDANSETRON HCL 4 MG PO TABS: 4.0000 mg | ORAL_TABLET | Freq: Four times a day (QID) | ORAL | Status: DC | PRN

## 2021-08-08 MED ORDER — NALOXONE HCL 0.4 MG/ML IJ SOLN: 0.4000 mg | INTRAMUSCULAR | Status: DC | PRN

## 2021-08-08 MED ORDER — FENTANYL CITRATE PF 50 MCG/ML IJ SOSY
25.0000 ug | PREFILLED_SYRINGE | Freq: Once | INTRAMUSCULAR | Status: AC
  Administered 2021-08-09: 25 ug via INTRAVENOUS
  Filled 2021-08-08: qty 1

## 2021-08-08 MED ORDER — FENTANYL CITRATE PF 50 MCG/ML IJ SOSY
PREFILLED_SYRINGE | INTRAMUSCULAR | Status: AC
  Filled 2021-08-08: qty 1

## 2021-08-08 MED ORDER — ONDANSETRON HCL 4 MG/2ML IJ SOLN
4.0000 mg | Freq: Four times a day (QID) | INTRAMUSCULAR | Status: DC | PRN
Start: 2021-08-08 — End: 2021-08-13
  Filled 2021-08-08: qty 2

## 2021-08-08 MED ORDER — ACETAMINOPHEN 325 MG PO TABS
650.0000 mg | ORAL_TABLET | Freq: Four times a day (QID) | ORAL | Status: DC | PRN
  Administered 2021-08-09 – 2021-08-12 (×2): 650 mg via ORAL
  Filled 2021-08-08 (×2): qty 2

## 2021-08-08 MED ORDER — HYDROXYCHLOROQUINE SULFATE 200 MG PO TABS
200.0000 mg | ORAL_TABLET | Freq: Every day | ORAL | Status: DC
Start: 2021-08-09 — End: 2021-08-13
  Administered 2021-08-10 – 2021-08-13 (×4): 200 mg via ORAL
  Filled 2021-08-08 (×4): qty 1

## 2021-08-08 MED ORDER — CEFAZOLIN SODIUM-DEXTROSE 2-4 GM/100ML-% IV SOLN
2.0000 g | Freq: Three times a day (TID) | INTRAVENOUS | Status: AC
  Administered 2021-08-08 – 2021-08-09 (×3): 2 g via INTRAVENOUS
  Filled 2021-08-08 (×3): qty 100

## 2021-08-08 MED ORDER — ACETAMINOPHEN 650 MG RE SUPP
650.0000 mg | Freq: Four times a day (QID) | RECTAL | Status: DC | PRN
Start: 2021-08-08 — End: 2021-08-13

## 2021-08-08 MED ORDER — OYSTER SHELL CALCIUM/D3 500-5 MG-MCG PO TABS
1.0000 | ORAL_TABLET | Freq: Every day | ORAL | Status: DC
  Administered 2021-08-10 – 2021-08-13 (×4): 1 via ORAL
  Filled 2021-08-08 (×4): qty 1

## 2021-08-08 MED ORDER — LIDOCAINE-EPINEPHRINE 2 %-1:100000 IJ SOLN: 20.0000 mL | Freq: Once | INTRAMUSCULAR | Status: DC

## 2021-08-08 MED ORDER — FOLIC ACID 1 MG PO TABS: 2.0000 mg | ORAL_TABLET | Freq: Every day | ORAL | Status: DC

## 2021-08-08 MED ORDER — OXYCODONE HCL 5 MG PO TABS
5.0000 mg | ORAL_TABLET | ORAL | Status: DC | PRN
  Administered 2021-08-08 – 2021-08-13 (×10): 5 mg via ORAL
  Filled 2021-08-08 (×9): qty 1

## 2021-08-08 MED ORDER — ONDANSETRON 4 MG PO TBDP: 4.0000 mg | ORAL_TABLET | Freq: Four times a day (QID) | ORAL | Status: DC | PRN

## 2021-08-08 MED ORDER — OYSTER SHELL CALCIUM/D3 500-5 MG-MCG PO TABS: 1.0000 | ORAL_TABLET | Freq: Every day | ORAL | Status: DC

## 2021-08-08 MED ORDER — DONEPEZIL HCL 5 MG PO TABS
5.0000 mg | ORAL_TABLET | Freq: Every day | ORAL | Status: DC
  Administered 2021-08-08 – 2021-08-12 (×5): 5 mg via ORAL
  Filled 2021-08-08 (×5): qty 1

## 2021-08-08 NOTE — Anesthesia Preprocedure Evaluation (Addendum)
Anesthesia Evaluation  ?Patient identified by MRN, date of birth, ID band ?Patient awake ? ? ? ?Reviewed: ?Allergy & Precautions, NPO status , Patient's Chart, lab work & pertinent test results ? ?History of Anesthesia Complications ?Negative for: history of anesthetic complications ? ?Airway ?Mallampati: II ? ?TM Distance: >3 FB ? ? ? ? Dental ?no notable dental hx. ?(+) Dental Advisory Given ?  ?Pulmonary ?neg pulmonary ROS,  ?  ?Pulmonary exam normal ? ? ? ? ? ? ? Cardiovascular ?hypertension, Pt. on home beta blockers and Pt. on medications ?Normal cardiovascular exam ? ? ?  ?Neuro/Psych ?PSYCHIATRIC DISORDERS Anxiety negative neurological ROS ?   ? GI/Hepatic ?Neg liver ROS, GERD  ,  ?Endo/Other  ?negative endocrine ROS ? Renal/GU ?negative Renal ROS  ? ?  ?Musculoskeletal ?negative musculoskeletal ROS ?(+)  ? Abdominal ?  ?Peds ? Hematology ?negative hematology ROS ?(+)   ?Anesthesia Other Findings ? ? Reproductive/Obstetrics ? ?  ? ? ? ? ? ? ? ? ? ? ? ? ? ?  ?  ? ? ? ? ? ? ? ?Anesthesia Physical ?Anesthesia Plan ? ?ASA: 3 ? ?Anesthesia Plan: General  ? ?Post-op Pain Management: Regional block* and Tylenol PO (pre-op)*  ? ?Induction: Intravenous ? ?PONV Risk Score and Plan: 3 and Ondansetron, Dexamethasone and Treatment may vary due to age or medical condition ? ?Airway Management Planned: Oral ETT ? ?Additional Equipment:  ? ?Intra-op Plan:  ? ?Post-operative Plan: Extubation in OR ? ?Informed Consent: I have reviewed the patients History and Physical, chart, labs and discussed the procedure including the risks, benefits and alternatives for the proposed anesthesia with the patient or authorized representative who has indicated his/her understanding and acceptance.  ? ? ? ?Dental advisory given ? ?Plan Discussed with: Anesthesiologist and CRNA ? ?Anesthesia Plan Comments:   ? ? ? ? ? ?Anesthesia Quick Evaluation ? ?

## 2021-08-08 NOTE — Progress Notes (Signed)
88 year old F with PMH of vertigo, frequent falls, RA, DVT on Eliquis, GAD and anemia presented to ED with right elbow injury after she tripped and fell.  Per EDP, did not hit her head or loss consciousness.  Vitals and labs stable.  X-ray of right elbow showed comminuted displaced supracondylar fracture of the right humerus.  Orthopedic surgery, Dr. Posey Pronto consulted.  Per EDP, Dr. Posey Pronto discussed with Dr. Marcelino Scot, and the later accepted patient to [facility] for surgical intervention.  I personally confirmed this with surgical PA, Hilbert Odor.  ? ?Per EDP, last dose of Eliquis was yesterday but not sure if it was in the morning or evening.  Patient received Tdap, Ancef and fentanyl.  Transfer accepted to Sac floor at [facility].  ?

## 2021-08-08 NOTE — ED Triage Notes (Signed)
EDP has seen pt at bedside. Pt to ED [facility] from home for mechanical ground level fall. R elbow appears swollen and possibly deformed. Pt takes eliquis. 8/10 throbbing pain. Son at bedside. Pt is HOH. ?

## 2021-08-08 NOTE — ED Notes (Signed)
EMTALA reviewed by this RN.  

## 2021-08-08 NOTE — ED Notes (Addendum)
External urinary catheter applied.  Pharmacy messaged about ancef. Pt repositioned for comfort. ?

## 2021-08-08 NOTE — Assessment & Plan Note (Addendum)
Result of a mechanical fall.   ?Patient underwent ORIF on 3/30.   ?Seen by PT and OT.  Inpatient rehabilitation was recommended.  Patient evaluated by CIR and not thought to be a candidate for same.  Plan is now for home with home health. ?Eliquis has been resumed by orthopedic service.   ?Pain is reasonably well controlled. ?

## 2021-08-08 NOTE — ED Notes (Signed)
Ortho at bedside, states pt. Will need transfer for surgery.  ?

## 2021-08-08 NOTE — ED Notes (Signed)
This RN to bedside to reposition pt. For comfort ? ? ?

## 2021-08-08 NOTE — Subjective & Objective (Addendum)
CC: right arm pain after fall ?HPI: ?88 year old female with a history of rheumatoid arthritis, hypertension, history of DVT and PE on chronic Eliquis therapy presents the ER today after having a fall at home.  This happened around 9 to 10:00 AM.  She states that she tripped while she was outside on her porch.  She fell onto an outstretched right arm.  She denies hitting her head.  She felt immediate pain in her right arm.  She was brought to the ER.  She had an open compound fracture of the right humerus.  Patient transferred to [facility] for definitive treatment.  She received her first dose of IV Ancef around 2 PM on 08/08/2021.  Patient denies any head trauma. ? ? ?Last dose of Eliquis was around 10 PM on 08/07/2021. ?

## 2021-08-08 NOTE — Consult Note (Addendum)
ORTHOPAEDIC CONSULTATION ? ?REQUESTING PHYSICIAN: Lucrezia Starch, MD ? ?Chief Complaint:   ?R elbow pain ? ?History of Present Illness: ?Teresa Lin is a 88 y.o. female RHD who had a fall earlier today. She noted immediate elbow pain and deformity with bleeding about the posterior aspect of her elbow.  Radiographs in the emergency department showed a bicolumnar distal humerus fracture.   ? ?Of note, she has had a prior right reverse shoulder arthroplasty for proximal humerus fracture by Dr. Roland Rack on 09/24/2018.  She is also on Eliquis with last dose yesterday evening. ? ?Past Medical History:  ?Diagnosis Date  ? Anemia   ? Anxiety   ? Arthritis   ? osteoarthritis. Bilateral knee replacement, hands, meniscal tear, cervical disc disease  ? Atherosclerosis   ? Atypical chest pain   ? Collagen vascular disease ([place])   ? Colon polyp 12/15/2017  ? Colon polyps   ? Compression fracture of L2 ([place])   ? Diverticulosis   ? DVT (deep venous thrombosis) ([place])   ? GERD (gastroesophageal reflux disease)   ? History of seronegative inflammatory arthritis   ? Hypertension   ? IBS (irritable bowel syndrome)   ? Internal hemorrhoids   ? Phlebitis and thrombophlebitis of deep veins of lower extremities (HCC)   ? after surgery  ? Vision abnormalities   ? ?Past Surgical History:  ?Procedure Laterality Date  ? APPENDECTOMY    ? COLONOSCOPY N/A 01/24/2020  ? Procedure: COLONOSCOPY;  Surgeon: Lesly Rubenstein, MD;  Location: [facility] ENDOSCOPY;  Service: Endoscopy;  Laterality: N/A;  ? COLONOSCOPY WITH PROPOFOL N/A 10/24/2017  ? Procedure: COLONOSCOPY WITH PROPOFOL;  Surgeon: Manya Silvas, MD;  Location: [facility] ENDOSCOPY;  Service: Endoscopy;  Laterality: N/A;  ? COLONOSCOPY WITH PROPOFOL N/A 11/21/2017  ? Procedure: COLONOSCOPY WITH PROPOFOL;  Surgeon: Manya Silvas, MD;  Location: [facility] ENDOSCOPY;  Service: Endoscopy;  Laterality: N/A;  ? DILATION AND CURETTAGE OF UTERUS     ? ESOPHAGOGASTRODUODENOSCOPY (EGD) WITH PROPOFOL N/A 02/25/2018  ? Procedure: ESOPHAGOGASTRODUODENOSCOPY (EGD) WITH PROPOFOL;  Surgeon: Manya Silvas, MD;  Location: [facility] ENDOSCOPY;  Service: Endoscopy;  Laterality: N/A;  ? EYE SURGERY    ? bilateral catarACT  ? JOINT REPLACEMENT Bilateral   ? knee replacement  ? LAPAROSCOPIC RIGHT COLECTOMY Right 12/15/2017  ? Procedure: LAPAROSCOPIC RIGHT COLECTOMY;  Surgeon: Robert Bellow, MD;  Location: [facility] ORS;  Service: General;  Laterality: Right;  ? REPLACEMENT TOTAL KNEE BILATERAL    ? TOTAL SHOULDER ARTHROPLASTY Right 09/24/2018  ? Procedure: TOTAL SHOULDER ARTHROPLASTY - REVERSE RIGHT;  Surgeon: Corky Mull, MD;  Location: [facility] ORS;  Service: Orthopedics;  Laterality: Right;  ? ?Social History  ? ?Socioeconomic History  ? Marital status: Widowed  ?  Spouse name: Not on file  ? Number of children: Not on file  ? Years of education: Not on file  ? Highest education level: Not on file  ?Occupational History  ? Not on file  ?Tobacco Use  ? Smoking status: Never  ? Smokeless tobacco: Never  ?Vaping Use  ? Vaping Use: Never used  ?Substance and Sexual Activity  ? Alcohol use: Yes  ?  Alcohol/week: 2.0 standard drinks  ?  Types: 2 Cans of beer per week  ?  Comment: occasionally   ? Drug use: No  ? Sexual activity: Never  ?Other Topics Concern  ? Not on file  ?Social History Narrative  ? Not on file  ? ?Social Determinants of Health  ? ?  Financial Resource Strain: Not on file  ?Food Insecurity: Not on file  ?Transportation Needs: Not on file  ?Physical Activity: Not on file  ?Stress: Not on file  ?Social Connections: Not on file  ? ?Family History  ?Problem Relation Age of Onset  ? Hypertension Mother   ? Cancer Mother 37  ?     colon ca  ? Heart attack Mother   ? Hypertension Father   ? Heart attack Father   ? Hypertension Sister   ? Heart attack Sister   ? Multiple sclerosis Daughter   ? Multiple sclerosis Son   ? Multiple sclerosis Son   ? ?Allergies  ?Allergen  Reactions  ? Infliximab Other (See Comments)  ?  Shaking, pain  ? Lidocaine-Menthol   ? Synvisc [Hylan G-F 20] Swelling  ? ?Prior to Admission medications   ?Medication Sig Start Date End Date Taking? Authorizing Provider  ?Abatacept (ORENCIA IV) Inject into the vein every 30 (thirty) days.    [provider]  ?APIXABAN Arne Cleveland) VTE STARTER PACK ('10MG'$  AND '5MG'$ ) Take as directed on package: start with two-'5mg'$  tablets twice daily for 7 days. On day 8, switch to one-'5mg'$  tablet twice daily. 02/03/20   Nance Pear, MD  ?aspirin EC 325 MG tablet Take 1 tablet (325 mg total) by mouth daily. 09/24/18   Lattie Corns, PA-C  ?calcium-vitamin D (OSCAL WITH D) 500-200 MG-UNIT per tablet Take 1 tablet by mouth daily.     [provider]  ?folic acid (FOLVITE) 1 MG tablet Take 2 mg by mouth daily.     [provider]  ?hydroxychloroquine (PLAQUENIL) 200 MG tablet Take 200 mg by mouth daily.    [provider]  ?methotrexate 50 MG/2ML injection Inject 20 mg into the skin every Sunday.  03/16/15   [provider]  ?Multiple Vitamins-Minerals (MULTIVITAMIN WITH MINERALS) tablet Take 1 tablet by mouth daily. Centrum Silver    [provider]  ?ondansetron (ZOFRAN ODT) 4 MG disintegrating tablet Take 1 tablet (4 mg total) by mouth every 6 (six) hours as needed for nausea or vomiting. 11/25/20   Delman Kitten, MD  ?Polyethyl Glycol-Propyl Glycol 0.4-0.3 % SOLN Place 1 drop into both eyes 3 (three) times daily as needed (for eye burning/irritation).    [provider]  ? ?No results for input(s): WBC, HGB, HCT, PLT, K, CL, CO2, BUN, CREATININE, GLUCOSE, CALCIUM, LABPT, INR in the last 72 hours. ?DG Elbow Complete Right ? ?Result Date: 08/08/2021 ?CLINICAL DATA:  Fall, right elbow deformity. EXAM: RIGHT ELBOW - COMPLETE 3+ VIEW COMPARISON:  None. FINDINGS: Comminuted displaced supracondylar fracture of the right humerus with intra-articular extension. There is ulnar and  anterior displacement of the distal fracture fragment. There is subcutaneous emphysema about the elbow joint. IMPRESSION: Comminuted displaced supracondylar fracture of the right humerus. Electronically Signed   By: Keane Police D.O.   On: 08/08/2021 12:41   ? ? ?Positive ROS: All other systems have been reviewed and were otherwise negative with the exception of those mentioned in the HPI and as above. ? ?Physical Exam: ?BP (!) 144/99   Pulse 74   Temp (!) 97.5 ?F (36.4 ?C) (Oral)   Resp 16   Ht '5\' 6"'$  (1.676 m)   Wt 61.7 kg   SpO2 98%   BMI 21.95 kg/m?  ?General:  Alert, no acute distress ? ? ?Orthopedic Exam:  ?RUE: ?+ain/pin/u motor ?SILT r/u/m/ax ?+rad pulse ?RoM not tested. Significant tenderness about distal humerus. Minimal tenderness  proximally about reverse shoulder arthroplasty. ?~1cm region of posterior aspect of distal arm proximal to elbow joint with continued bleeding. This communicates with the fracture. No gross contamination. ? ?X-rays:  ?As above: Comminuted, bicolumnar distal humerus fracture.  ? ?Assessment/Plan: ?88 yo F w/R open bicolumnar distal humerus fracture.  ?Recommended dose of IV Ancef in the ED.  ?Discussed treatment options with patients and recommended surgical intervention given current fracture. Neither I or my partners are regularly performing ORIF of the distal humerus. Given that this is an open fracture, I recommended the patient be transferred for definitive fixation on a non-elective basis. Patient would prefer transfer to [facility].  ?Posterior splint for immobilization.  ?I also recommended obtaining R shoulder radiographs to rule out any extension of fracture about RSA prosthesis.  ?All of the above discussed with ED staff and patient. ? ? ?Leim Fabry ? ? ?08/08/2021 ?1:12 PM ? ? ? ?ADDENDUM: ?I talked with the on-call orthopedic surgeon at [facility].  I was informed that it would be unlikely that anyone at [facility] would be able to perform definitive surgery as well as  debridement either today or tomorrow.  If she were to be transferred to [facility], she would likely undergo debridement tonight with plan to follow-up with one of the hand/upper extremity surgeons for definitive fixation at a [facility]

## 2021-08-08 NOTE — Assessment & Plan Note (Addendum)
-   Continue Eliquis 

## 2021-08-08 NOTE — ED Notes (Signed)
[name]  transfer  center  called  per  DR Creig Hines  MD ?

## 2021-08-08 NOTE — Progress Notes (Signed)
Orthopaedic Trauma Service  ? ?OTS aware of pt  ?88 y/o female transfer from [facility] with type 1 open R distal humerus fracture (s/p R reverse total shoulder 2020), history of RA and connective tissue disease, on DMARDs ?On chronic anticoagulation (eliquis) for PE/VTE, last dose was 2200 on 08/07/2021 ?Received ancef in ED upon arrival  ?Case discussed with Dr Posey Pronto at [facility]. Transferred to [facility] as Dr. Posey Pronto asserted this injury is outside is area of expertise and she would be best treated by an orthopaedic trauma fellowship trained surgeon ? ?CT ordered tonight for pre-op planning ?Ancef ordered q8h per open fracture protocol  ? ?OR tomorrow at 0800 for I&D and ORIF  ? ?[place] for pre-op nerve block AFTER nerve exam completed  ? ?NPO after MN  ? ?Continue with ice and elevation, elevate hand above elbow and elbow above heart  ? ?Jari Pigg, PA-C ?[phone] (C) ?08/08/2021, 10:23 PM ? ?[facility] ?[place] [address] ?[phone] Jenetta Downer) ?[phone] (F) ? ?  ? ? ?Patient ID: Teresa Lin, female   DOB: January 23, 1932, 88 y.o.   MRN: [id number] ? ?

## 2021-08-08 NOTE — H&P (Signed)
?History and Physical  ? ? ?Teresa Lin OZH:[id number] DOB: [birth year] DOA: 08/08/2021 ? ?DOS: the patient was seen and examined on 08/08/2021 ? ?PCP: Adin Hector, MD  ? ?Patient coming from:  [facility] ER ? ?I have personally briefly reviewed patient's old medical records in [place] ? ?CC: right arm pain after fall ?HPI: ?88 year old female with a history of rheumatoid arthritis, hypertension, history of DVT and PE on chronic Eliquis therapy presents the ER today after having a fall at home.  This happened around 9 to 10:00 AM.  She states that she tripped while she was outside on her porch.  She fell onto an outstretched right arm.  She denies hitting her head.  She felt immediate pain in her right arm.  She was brought to the ER.  She had an open compound fracture of the right humerus.  Patient transferred to [facility] for definitive treatment.  She received her first dose of IV Ancef around 2 PM on 08/08/2021.  Patient denies any head trauma. ? ? ?Last dose of Eliquis was around 10 PM on 08/07/2021.  ? ?ED Course: Patient seen initially in the [facility] ER.  Orthopedics consulted.  Orthopedics discussed the case with Dr. Marcelino Scot with orthopedics at [facility].  Patient transferred to main campus for definitive surgery. ? ?Review of Systems:  ?Review of Systems  ?Constitutional: Negative.   ?HENT: Negative.    ?Eyes: Negative.   ?Respiratory: Negative.    ?Cardiovascular: Negative.   ?Gastrointestinal: Negative.   ?Genitourinary: Negative.   ?Musculoskeletal:  Positive for joint pain.  ?Skin: Negative.   ?Neurological: Negative.   ?Endo/Heme/Allergies: Negative.   ?Psychiatric/Behavioral: Negative.    ?All other systems reviewed and are negative. ? ?Past Medical History:  ?Diagnosis Date  ? Anemia   ? Anxiety   ? Arthritis   ? osteoarthritis. Bilateral knee replacement, hands, meniscal tear, cervical disc disease  ? Atherosclerosis   ? Atypical chest pain   ? Collagen vascular disease  ([place])   ? Colon polyp 12/15/2017  ? Colon polyps   ? Compression fracture of L2 ([place])   ? Diverticulosis   ? DVT (deep venous thrombosis) ([place])   ? Fracture of one rib, right side, subsequent encounter for fracture with routine healing 03/08/2017  ? GERD (gastroesophageal reflux disease)   ? History of seronegative inflammatory arthritis   ? Hypertension   ? IBS (irritable bowel syndrome)   ? Internal hemorrhoids   ? Phlebitis and thrombophlebitis of deep veins of lower extremities (HCC)   ? after surgery  ? Vision abnormalities   ? ? ?Past Surgical History:  ?Procedure Laterality Date  ? APPENDECTOMY    ? COLONOSCOPY N/A 01/24/2020  ? Procedure: COLONOSCOPY;  Surgeon: Lesly Rubenstein, MD;  Location: [facility] ENDOSCOPY;  Service: Endoscopy;  Laterality: N/A;  ? COLONOSCOPY WITH PROPOFOL N/A 10/24/2017  ? Procedure: COLONOSCOPY WITH PROPOFOL;  Surgeon: Manya Silvas, MD;  Location: [facility] ENDOSCOPY;  Service: Endoscopy;  Laterality: N/A;  ? COLONOSCOPY WITH PROPOFOL N/A 11/21/2017  ? Procedure: COLONOSCOPY WITH PROPOFOL;  Surgeon: Manya Silvas, MD;  Location: [facility] ENDOSCOPY;  Service: Endoscopy;  Laterality: N/A;  ? DILATION AND CURETTAGE OF UTERUS    ? ESOPHAGOGASTRODUODENOSCOPY (EGD) WITH PROPOFOL N/A 02/25/2018  ? Procedure: ESOPHAGOGASTRODUODENOSCOPY (EGD) WITH PROPOFOL;  Surgeon: Manya Silvas, MD;  Location: [facility] ENDOSCOPY;  Service: Endoscopy;  Laterality: N/A;  ? EYE SURGERY    ? bilateral catarACT  ? JOINT  REPLACEMENT Bilateral   ? knee replacement  ? LAPAROSCOPIC RIGHT COLECTOMY Right 12/15/2017  ? Procedure: LAPAROSCOPIC RIGHT COLECTOMY;  Surgeon: Robert Bellow, MD;  Location: [facility] ORS;  Service: General;  Laterality: Right;  ? REPLACEMENT TOTAL KNEE BILATERAL    ? TOTAL SHOULDER ARTHROPLASTY Right 09/24/2018  ? Procedure: TOTAL SHOULDER ARTHROPLASTY - REVERSE RIGHT;  Surgeon: Corky Mull, MD;  Location: [facility] ORS;  Service: Orthopedics;  Laterality: Right;  ? ? ? reports that she has never  smoked. She has never used smokeless tobacco. She reports current alcohol use of about 2.0 standard drinks per week. She reports that she does not use drugs. ? ?Allergies  ?Allergen Reactions  ? Infliximab Other (See Comments)  ?  Shaking, pain  ? Lidocaine-Menthol   ? Synvisc [Hylan G-F 20] Swelling  ? ? ?Family History  ?Problem Relation Age of Onset  ? Hypertension Mother   ? Cancer Mother 51  ?     colon ca  ? Heart attack Mother   ? Hypertension Father   ? Heart attack Father   ? Hypertension Sister   ? Heart attack Sister   ? Multiple sclerosis Daughter   ? Multiple sclerosis Son   ? Multiple sclerosis Son   ? ? ?Prior to Admission medications   ?Medication Sig Start Date End Date Taking? Authorizing Provider  ?Abatacept (ORENCIA IV) Inject into the vein every 30 (thirty) days.    [provider]  ?APIXABAN Arne Cleveland) VTE STARTER PACK ('10MG'$  AND '5MG'$ ) Take as directed on package: start with two-'5mg'$  tablets twice daily for 7 days. On day 8, switch to one-'5mg'$  tablet twice daily. 02/03/20   Nance Pear, MD  ?aspirin EC 325 MG tablet Take 1 tablet (325 mg total) by mouth daily. 09/24/18   Lattie Corns, PA-C  ?calcium-vitamin D (OSCAL WITH D) 500-200 MG-UNIT per tablet Take 1 tablet by mouth daily.     [provider]  ?folic acid (FOLVITE) 1 MG tablet Take 2 mg by mouth daily.     [provider]  ?hydroxychloroquine (PLAQUENIL) 200 MG tablet Take 200 mg by mouth daily.    [provider]  ?methotrexate 50 MG/2ML injection Inject 20 mg into the skin every Sunday.  03/16/15   [provider]  ?Multiple Vitamins-Minerals (MULTIVITAMIN WITH MINERALS) tablet Take 1 tablet by mouth daily. Centrum Silver    [provider]  ?ondansetron (ZOFRAN ODT) 4 MG disintegrating tablet Take 1 tablet (4 mg total) by mouth every 6 (six) hours as needed for nausea or vomiting. 11/25/20   Delman Kitten, MD  ?Polyethyl Glycol-Propyl Glycol 0.4-0.3 % SOLN Place 1 drop into both  eyes 3 (three) times daily as needed (for eye burning/irritation).    [provider]  ? ? ?Physical Exam: ?Vitals:  ? 08/08/21 2100 08/08/21 2119  ?BP: (!) 152/69   ?Pulse: 76   ?Resp: 18   ?Temp: (!) 100.4 ?F (38 ?C)   ?TempSrc: Oral   ?SpO2: 97%   ?Weight:  61.7 kg  ?Height:  '5\' 6"'$  (1.676 m)  ? ? ?Physical Exam ?Vitals and nursing note reviewed.  ?Constitutional:   ?   General: She is not in acute distress. ?   Appearance: She is normal weight. She is not ill-appearing, toxic-appearing or diaphoretic.  ?HENT:  ?   Head: Normocephalic and atraumatic.  ?   Nose: Nose normal.  ?Cardiovascular:  ?   Rate and Rhythm: Normal rate and regular rhythm.  ?  Heart sounds: Murmur heard.  ?Pulmonary:  ?   Effort: Pulmonary effort is normal. No respiratory distress.  ?   Breath sounds: Normal breath sounds. No wheezing or rales.  ?Abdominal:  ?   General: Abdomen is flat. Bowel sounds are normal. There is no distension.  ?   Tenderness: There is no abdominal tenderness. There is no guarding or rebound.  ?Musculoskeletal:  ?   Comments: Right arm in sling  ?Skin: ?   General: Skin is warm and dry.  ?   Capillary Refill: Capillary refill takes less than 2 seconds.  ?Neurological:  ?   General: No focal deficit present.  ?   Mental Status: She is alert and oriented to person, place, and time.  ?  ? ?Labs on Admission: I have personally reviewed following labs and imaging studies ? ?CBC: ?Recent Labs  ?Lab 08/08/21 ?1333  ?WBC 9.0  ?NEUTROABS 7.7  ?HGB 13.3  ?HCT 41.3  ?MCV 96.9  ?PLT 292  ? ?Basic Metabolic Panel: ?Recent Labs  ?Lab 08/08/21 ?1333  ?NA 137  ?K 3.8  ?CL 100  ?CO2 26  ?GLUCOSE 143*  ?BUN 18  ?CREATININE 0.63  ?CALCIUM 8.8*  ? ?GFR: ?Estimated Creatinine Clearance: 46.4 mL/min (by C-G formula based on SCr of 0.63 mg/dL). ?Liver Function Tests: ?No results for input(s): AST, ALT, ALKPHOS, BILITOT, PROT, ALBUMIN in the last 168 hours. ?No results for input(s): LIPASE, AMYLASE in the last 168 hours. ?No  results for input(s): AMMONIA in the last 168 hours. ?Coagulation Profile: ?Recent Labs  ?Lab 08/08/21 ?1333  ?INR 1.1  ? ?Cardiac Enzymes: ?No results for input(s): CKTOTAL, CKMB, CKMBINDEX, TROPONINI, TROPONINI

## 2021-08-08 NOTE — ED Notes (Signed)
XRAY  POWERSHARE  WITH  [name] 

## 2021-08-08 NOTE — ED Notes (Signed)
Called Doug in [place] to transfer patient to [place] ?

## 2021-08-08 NOTE — Assessment & Plan Note (Addendum)
On methotrexate, plaquenil and Orencia as outpatient.   ?Methotrexate is a weekly injection.  Orencia injection given once a month. ?

## 2021-08-08 NOTE — ED Provider Notes (Addendum)
? ?[facility] ?Provider Note ? ? ? Event Date/Time  ? First MD Initiated Contact with Patient 08/08/21 1153   ?  (approximate) ? ? ?History  ? ?Fall and R elbow deformity ? ? ?HPI ? ?Teresa Lin is a 88 y.o. female with a past medical history of anemia, anxiety, arthritis, DVT on Eliquis, IBS, and collagen vascular disease who presents for evaluation of a right elbow injury after a fall that occurred earlier today.  Patient tripped falling onto the right elbow.  She did not hit her head or pass out.  She has no pain other than the right elbow including any new pain in the right wrist or shoulder.  No other recent falls or injuries or sick symptoms per patient.  EMS did give 25 mcg of fentanyl prior to arrival which patient states helped a bunch.  She is not sure when her last tetanus shot was. ? ?  ?Past Medical History:  ?Diagnosis Date  ? Anemia   ? Anxiety   ? Arthritis   ? osteoarthritis. Bilateral knee replacement, hands, meniscal tear, cervical disc disease  ? Atherosclerosis   ? Atypical chest pain   ? Collagen vascular disease ([place])   ? Colon polyp 12/15/2017  ? Colon polyps   ? Compression fracture of L2 ([place])   ? Diverticulosis   ? DVT (deep venous thrombosis) ([place])   ? GERD (gastroesophageal reflux disease)   ? History of seronegative inflammatory arthritis   ? Hypertension   ? IBS (irritable bowel syndrome)   ? Internal hemorrhoids   ? Phlebitis and thrombophlebitis of deep veins of lower extremities (HCC)   ? after surgery  ? Vision abnormalities   ? ? ? ?Physical Exam  ?Triage Vital Signs: ?ED Triage Vitals [08/08/21 1203]  ?Enc Vitals Group  ?   BP (!) 144/99  ?   Pulse Rate 74  ?   Resp 16  ?   Temp (!) 97.5 ?F (36.4 ?C)  ?   Temp Source Oral  ?   SpO2 98 %  ?   Weight   ?   Height   ?   Head Circumference   ?   Peak Flow   ?   Pain Score   ?   Pain Loc   ?   Pain Edu?   ?   Excl. in [place]?   ? ? ?Most recent vital signs: ?Vitals:  ? 08/08/21 1315 08/08/21 1434  ?BP:  (!) 146/67   ?Pulse: 85 74  ?Resp:  18  ?Temp:    ?SpO2: 100% 94%  ? ? ?General: Awake, no distress.  ?CV:  Good peripheral perfusion.  2+ radial pulses. ?Resp:  Normal effort.  ?Abd:  No distention.  ?Other:  No effusion deformity skin changes or tenderness around the right shoulder or wrist.  No snuffbox tenderness.  Sensation is intact in the distribution of the radial ulnar and median nerves in the right hand.  Patient has limited range of motion at the right elbow.  There is a 1 cm hemostatic laceration of the posterior aspect of the right elbow.  No large effusion but there is deformity. ? ? ?ED Results / Procedures / Treatments  ?Labs ?(all labs ordered are listed, but only abnormal results are displayed) ?Labs Reviewed  ?BASIC METABOLIC PANEL - Abnormal; Notable for the following components:  ?    Result Value  ? Glucose, Bld 143 (*)   ? Calcium 8.8 (*)   ?  All other components within normal limits  ?RESP PANEL BY RT-PCR (FLU A&B, COVID) [id number]  ?CBC WITH DIFFERENTIAL/PLATELET  ?PROTIME-INR  ? ? ? ?EKG ? ? ?RADIOLOGY ? ?Plain film the right shoulder shows no acute fracture dislocation with previous hardware from arthroplasty in place.  Also reviewed radiology interpretation and agree with their findings. ? ?Plain film of the right elbow and my interpretation shows comminuted displaced supracondylar fracture.  I also reviewed radiology interpretation and agree with the findings of same. ? ? ?PROCEDURES: ? ?Critical Care performed: No ? ?.Critical Care ?Performed by: Lucrezia Starch, MD ?Authorized by: Lucrezia Starch, MD  ? ?Critical care provider statement:  ?  Critical care time (minutes):  30 ?  Critical care was necessary to treat or prevent imminent or life-threatening deterioration of the following conditions:  Trauma ?  Critical care was time spent personally by me on the following activities:  Development of treatment plan with patient or surrogate, discussions with consultants, evaluation of patient's response  to treatment, examination of patient, ordering and review of laboratory studies, ordering and review of radiographic studies, ordering and performing treatments and interventions, pulse oximetry, re-evaluation of patient's condition and review of old charts ? ? ? ?MEDICATIONS ORDERED IN ED: ?Medications  ?lidocaine-EPINEPHrine (XYLOCAINE W/EPI) 2 %-1:100000 (with pres) injection 20 mL (has no administration in time range)  ?Tdap (BOOSTRIX) injection 0.5 mL (0.5 mLs Intramuscular Given 08/08/21 1242)  ?ceFAZolin (ANCEF) IVPB 2g/100 mL premix (2 g Intravenous New Bag/Given 08/08/21 1353)  ?fentaNYL (SUBLIMAZE) injection 25 mcg (25 mcg Intravenous Given 08/08/21 1407)  ? ? ? ?IMPRESSION / MDM / ASSESSMENT AND PLAN / ED COURSE  ?I reviewed the triage vital signs and the nursing notes. ?             ?               ? ?Differential diagnosis includes, but is not limited to superficial laceration and underlying contusion, injury to the bursa and possibly underlying elbow fracture.  Low suspicion for other significant orthopedic injury and patient is otherwise neurovascular intact distally.  Elbow is soft and Evalose patient for compartment syndrome. ? ?Plain film the right shoulder shows no acute fracture dislocation with previous hardware from arthroplasty in place.  Also reviewed radiology interpretation and agree with their findings. ? ?Plain film of the right elbow and my interpretation shows comminuted displaced supracondylar fracture.  I also reviewed radiology interpretation and agree with the findings of same. ? ?Given concern for open fracture Ancef ordered and tetanus updated.  Dr. Posey Pronto with orthopedic service consulted to come evaluate patient at bedside.  They recommended reaching out to [facility] for potential transfer as he recommended no more washout with ORIF repair. ? ?[name] is there and will take patient at this time Dr. Posey Pronto discussed patient with on-call orthopedist Dr. Marcelino Scot at [facility] who recommended transfer to [facility] under hospitalist service for operative repair.  Patient excepted for transfer by hospitalist Dr. Cyndia Skeeters.  ? ?Care patient signed over to assuming provider at approximately 8 PM.  Patient is pending transfer to [facility]. ? ?FINAL CLINICAL IMPRESSION(S) / ED DIAGNOSES  ? ?Final diagnoses:  ?Open fracture dislocation of right elbow joint, initial encounter  ?Anticoagulated  ?Fall, initial encounter  ? ? ? ?Rx / DC Orders  ? ?ED Discharge Orders   ? ? None  ? ?  ? ? ? ?Note:  This document was prepared using Dragon  voice recognition software and may include unintentional dictation errors. ?  ?Lucrezia Starch, MD ?08/08/21 1544 ? ?  ?Lucrezia Starch, MD ?08/08/21 1941 ? ?  ?Lucrezia Starch, MD ?08/08/21 2214 ? ?

## 2021-08-08 NOTE — Assessment & Plan Note (Deleted)
On MTX and Orencia. ?

## 2021-08-08 NOTE — Assessment & Plan Note (Deleted)
Stable

## 2021-08-09 ENCOUNTER — Inpatient Hospital Stay (HOSPITAL_COMMUNITY): Payer: Medicare Other | Admitting: Certified Registered"

## 2021-08-09 ENCOUNTER — Inpatient Hospital Stay (HOSPITAL_COMMUNITY): Payer: Medicare Other

## 2021-08-09 ENCOUNTER — Other Ambulatory Visit: Payer: Self-pay

## 2021-08-09 ENCOUNTER — Encounter (HOSPITAL_COMMUNITY): Payer: Self-pay | Admitting: Internal Medicine

## 2021-08-09 ENCOUNTER — Encounter (HOSPITAL_COMMUNITY)
Admission: AD | Disposition: A | Discharge status: Home Health | Payer: Self-pay | Source: Other Acute Inpatient Hospital | Attending: Internal Medicine

## 2021-08-09 ENCOUNTER — Inpatient Hospital Stay (HOSPITAL_COMMUNITY): Admission: RE | Admit: 2021-08-09 | Payer: Medicare Other | Source: Home / Self Care | Admitting: Orthopedic Surgery

## 2021-08-09 DIAGNOSIS — M0609 Rheumatoid arthritis without rheumatoid factor, multiple sites: Secondary | ICD-10-CM | POA: Diagnosis not present

## 2021-08-09 DIAGNOSIS — E876 Hypokalemia: Secondary | ICD-10-CM | POA: Diagnosis not present

## 2021-08-09 DIAGNOSIS — S42491B Other displaced fracture of lower end of right humerus, initial encounter for open fracture: Secondary | ICD-10-CM | POA: Diagnosis not present

## 2021-08-09 DIAGNOSIS — I10 Essential (primary) hypertension: Secondary | ICD-10-CM

## 2021-08-09 DIAGNOSIS — Z86711 Personal history of pulmonary embolism: Secondary | ICD-10-CM | POA: Diagnosis not present

## 2021-08-09 DIAGNOSIS — F419 Anxiety disorder, unspecified: Secondary | ICD-10-CM

## 2021-08-09 DIAGNOSIS — S42471B Displaced transcondylar fracture of right humerus, initial encounter for open fracture: Secondary | ICD-10-CM

## 2021-08-09 HISTORY — PX: ORIF HUMERUS FRACTURE: SHX2126

## 2021-08-09 LAB — CBC WITH DIFFERENTIAL/PLATELET
Abs Immature Granulocytes: 0.06 10*3/uL (ref 0.00–0.07)
Basophils Absolute: 0 10*3/uL (ref 0.0–0.1)
Basophils Relative: 0 %
Eosinophils Absolute: 0 10*3/uL (ref 0.0–0.5)
Eosinophils Relative: 0 %
HCT: 36.6 % (ref 36.0–46.0)
Hemoglobin: 12.3 g/dL (ref 12.0–15.0)
Immature Granulocytes: 1 %
Lymphocytes Relative: 12 %
Lymphs Abs: 1 10*3/uL (ref 0.7–4.0)
MCH: 31.9 pg (ref 26.0–34.0)
MCHC: 33.6 g/dL (ref 30.0–36.0)
MCV: 94.8 fL (ref 80.0–100.0)
Monocytes Absolute: 0.7 10*3/uL (ref 0.1–1.0)
Monocytes Relative: 9 %
Neutro Abs: 6.2 10*3/uL (ref 1.7–7.7)
Neutrophils Relative %: 78 %
Platelets: 281 10*3/uL (ref 150–400)
RBC: 3.86 MIL/uL — ABNORMAL LOW (ref 3.87–5.11)
RDW: 13.5 % (ref 11.5–15.5)
WBC: 7.9 10*3/uL (ref 4.0–10.5)
nRBC: 0 % (ref 0.0–0.2)

## 2021-08-09 LAB — SURGICAL PCR SCREEN
MRSA, PCR: NEGATIVE
Staphylococcus aureus: NEGATIVE

## 2021-08-09 LAB — COMPREHENSIVE METABOLIC PANEL
ALT: 16 U/L (ref 0–44)
AST: 29 U/L (ref 15–41)
Albumin: 3.3 g/dL — ABNORMAL LOW (ref 3.5–5.0)
Alkaline Phosphatase: 41 U/L (ref 38–126)
Anion gap: 10 (ref 5–15)
BUN: 11 mg/dL (ref 8–23)
CO2: 25 mmol/L (ref 22–32)
Calcium: 8.4 mg/dL — ABNORMAL LOW (ref 8.9–10.3)
Chloride: 103 mmol/L (ref 98–111)
Creatinine, Ser: 0.55 mg/dL (ref 0.44–1.00)
GFR, Estimated: >60 mL/min (ref >60)
Glucose, Bld: 121 mg/dL — ABNORMAL HIGH (ref 70–99)
Potassium: 3.3 mmol/L — ABNORMAL LOW (ref 3.5–5.1)
Sodium: 138 mmol/L (ref 135–145)
Total Bilirubin: 0.6 mg/dL (ref 0.3–1.2)
Total Protein: 6.1 g/dL — ABNORMAL LOW (ref 6.5–8.1)

## 2021-08-09 LAB — VITAMIN D 25 HYDROXY (VIT D DEFICIENCY, FRACTURES): Vit D, 25-Hydroxy: 80.16 ng/mL (ref 30–100)

## 2021-08-09 LAB — MAGNESIUM: Magnesium: 1.9 mg/dL (ref 1.7–2.4)

## 2021-08-09 SURGERY — OPEN REDUCTION INTERNAL FIXATION (ORIF) DISTAL HUMERUS FRACTURE
Anesthesia: General | Laterality: Right

## 2021-08-09 MED ORDER — PROPOFOL 10 MG/ML IV BOLUS
INTRAVENOUS | Status: AC
  Filled 2021-08-09: qty 20

## 2021-08-09 MED ORDER — ONDANSETRON HCL 4 MG/2ML IJ SOLN
INTRAMUSCULAR | Status: DC | PRN
Start: 2021-08-09 — End: 2021-08-09
  Administered 2021-08-09: 4 mg via INTRAVENOUS

## 2021-08-09 MED ORDER — FENTANYL CITRATE (PF) 100 MCG/2ML IJ SOLN
INTRAMUSCULAR | Status: AC
  Filled 2021-08-09: qty 2

## 2021-08-09 MED ORDER — ROPIVACAINE HCL 5 MG/ML IJ SOLN
INTRAMUSCULAR | Status: DC | PRN
  Administered 2021-08-09: 30 mL via PERINEURAL

## 2021-08-09 MED ORDER — ASCORBIC ACID 500 MG PO TABS
1000.0000 mg | ORAL_TABLET | Freq: Every day | ORAL | Status: DC
  Administered 2021-08-09 – 2021-08-13 (×5): 1000 mg via ORAL
  Filled 2021-08-09 (×5): qty 2

## 2021-08-09 MED ORDER — FENTANYL CITRATE (PF) 250 MCG/5ML IJ SOLN
INTRAMUSCULAR | Status: AC
  Filled 2021-08-09: qty 5

## 2021-08-09 MED ORDER — FENTANYL CITRATE (PF) 100 MCG/2ML IJ SOLN: 25.0000 ug | INTRAMUSCULAR | Status: DC | PRN

## 2021-08-09 MED ORDER — MIDAZOLAM HCL 2 MG/2ML IJ SOLN
INTRAMUSCULAR | Status: AC
  Filled 2021-08-09: qty 2

## 2021-08-09 MED ORDER — CHLORHEXIDINE GLUCONATE 0.12 % MT SOLN
OROMUCOSAL | Status: AC
  Administered 2021-08-09: 15 mL
  Filled 2021-08-09: qty 15

## 2021-08-09 MED ORDER — PHENYLEPHRINE HCL-NACL 20-0.9 MG/250ML-% IV SOLN
INTRAVENOUS | Status: DC | PRN
  Administered 2021-08-09: 25 ug/min via INTRAVENOUS

## 2021-08-09 MED ORDER — ROCURONIUM BROMIDE 10 MG/ML (PF) SYRINGE
PREFILLED_SYRINGE | INTRAVENOUS | Status: DC | PRN
  Administered 2021-08-09 (×2): 20 mg via INTRAVENOUS
  Administered 2021-08-09: 10 mg via INTRAVENOUS
  Administered 2021-08-09: 80 mg via INTRAVENOUS

## 2021-08-09 MED ORDER — SUGAMMADEX SODIUM 200 MG/2ML IV SOLN
INTRAVENOUS | Status: DC | PRN
  Administered 2021-08-09: 50 mg via INTRAVENOUS
  Administered 2021-08-09: 150 mg via INTRAVENOUS

## 2021-08-09 MED ORDER — POTASSIUM CHLORIDE CRYS ER 20 MEQ PO TBCR
40.0000 meq | EXTENDED_RELEASE_TABLET | Freq: Once | ORAL | Status: AC
  Administered 2021-08-09: 40 meq via ORAL
  Filled 2021-08-09: qty 2

## 2021-08-09 MED ORDER — PROPOFOL 10 MG/ML IV BOLUS
INTRAVENOUS | Status: DC | PRN
Start: 2021-08-09 — End: 2021-08-09
  Administered 2021-08-09: 100 mg via INTRAVENOUS

## 2021-08-09 MED ORDER — ROCURONIUM BROMIDE 10 MG/ML (PF) SYRINGE
PREFILLED_SYRINGE | INTRAVENOUS | Status: AC
  Filled 2021-08-09: qty 10

## 2021-08-09 MED ORDER — EPHEDRINE SULFATE-NACL 50-0.9 MG/10ML-% IV SOSY
PREFILLED_SYRINGE | INTRAVENOUS | Status: DC | PRN
  Administered 2021-08-09: 5 mg via INTRAVENOUS

## 2021-08-09 MED ORDER — LIDOCAINE 2% (20 MG/ML) 5 ML SYRINGE
INTRAMUSCULAR | Status: AC
  Filled 2021-08-09: qty 5

## 2021-08-09 MED ORDER — FENTANYL CITRATE (PF) 250 MCG/5ML IJ SOLN
INTRAMUSCULAR | Status: DC | PRN
  Administered 2021-08-09: 25 ug via INTRAVENOUS
  Administered 2021-08-09: 50 ug via INTRAVENOUS
  Administered 2021-08-09: 25 ug via INTRAVENOUS

## 2021-08-09 MED ORDER — ACETAMINOPHEN 325 MG PO TABS
650.0000 mg | ORAL_TABLET | Freq: Four times a day (QID) | ORAL | Status: DC
  Administered 2021-08-09 – 2021-08-10 (×5): 650 mg via ORAL
  Filled 2021-08-09 (×4): qty 2

## 2021-08-09 MED ORDER — CEFAZOLIN SODIUM-DEXTROSE 2-4 GM/100ML-% IV SOLN
2.0000 g | Freq: Three times a day (TID) | INTRAVENOUS | Status: AC
  Administered 2021-08-09 – 2021-08-10 (×3): 2 g via INTRAVENOUS
  Filled 2021-08-09 (×3): qty 100

## 2021-08-09 MED ORDER — PHENYLEPHRINE 40 MCG/ML (10ML) SYRINGE FOR IV PUSH (FOR BLOOD PRESSURE SUPPORT)
PREFILLED_SYRINGE | INTRAVENOUS | Status: DC | PRN
  Administered 2021-08-09 (×2): 80 ug via INTRAVENOUS

## 2021-08-09 MED ORDER — DEXAMETHASONE SODIUM PHOSPHATE 10 MG/ML IJ SOLN
INTRAMUSCULAR | Status: DC | PRN
  Administered 2021-08-09 (×2): 5 mg

## 2021-08-09 MED ORDER — AMISULPRIDE (ANTIEMETIC) 5 MG/2ML IV SOLN: 10.0000 mg | Freq: Once | INTRAVENOUS | Status: DC | PRN

## 2021-08-09 MED ORDER — DEXAMETHASONE SODIUM PHOSPHATE 10 MG/ML IJ SOLN
INTRAMUSCULAR | Status: AC
  Filled 2021-08-09: qty 1

## 2021-08-09 MED ORDER — ONDANSETRON HCL 4 MG/2ML IJ SOLN
INTRAMUSCULAR | Status: AC
  Filled 2021-08-09: qty 2

## 2021-08-09 MED ORDER — 0.9 % SODIUM CHLORIDE (POUR BTL) OPTIME
TOPICAL | Status: DC | PRN
  Administered 2021-08-09: 1000 mL

## 2021-08-09 SURGICAL SUPPLY — 97 items
BAG COUNTER SPONGE SURGICOUNT (BAG) ×2 IMPLANT
BIT DRILL QC 2.5X135 (BIT) ×1 IMPLANT
BIT DRILL QC 2.8X135 (BIT) ×1 IMPLANT
BIT DRILL QC 2X140 (BIT) ×1 IMPLANT
BLADE AVERAGE 25X9 (BLADE) IMPLANT
BNDG COHESIVE 4X5 TAN STRL (GAUZE/BANDAGES/DRESSINGS) ×2 IMPLANT
BNDG ELASTIC 3X5.8 VLCR STR LF (GAUZE/BANDAGES/DRESSINGS) ×1 IMPLANT
BNDG ESMARK 4X9 LF (GAUZE/BANDAGES/DRESSINGS) IMPLANT
BNDG GAUZE ELAST 4 BULKY (GAUZE/BANDAGES/DRESSINGS) ×3 IMPLANT
BRUSH SCRUB EZ PLAIN DRY (MISCELLANEOUS) ×4 IMPLANT
CORD BIPOLAR FORCEPS 12FT (ELECTRODE) IMPLANT
COVER SURGICAL LIGHT HANDLE (MISCELLANEOUS) ×2 IMPLANT
DRAIN PENROSE 1/4X12 LTX STRL (WOUND CARE) IMPLANT
DRAPE C-ARM 42X72 X-RAY (DRAPES) ×2 IMPLANT
DRAPE C-ARMOR (DRAPES) IMPLANT
DRAPE HALF SHEET 40X57 (DRAPES) IMPLANT
DRAPE INCISE IOBAN 66X45 STRL (DRAPES) IMPLANT
DRAPE ORTHO SPLIT 77X108 STRL (DRAPES) ×2
DRAPE SURG ORHT 6 SPLT 77X108 (DRAPES) ×1 IMPLANT
DRAPE U-SHAPE 47X51 STRL (DRAPES) ×4 IMPLANT
DRSG ADAPTIC 3X8 NADH LF (GAUZE/BANDAGES/DRESSINGS) ×2 IMPLANT
DRSG MEPITEL 4X7.2 (GAUZE/BANDAGES/DRESSINGS) ×2 IMPLANT
DRSG PAD ABDOMINAL 8X10 ST (GAUZE/BANDAGES/DRESSINGS) ×3 IMPLANT
ELECT REM PT RETURN 9FT ADLT (ELECTROSURGICAL) ×2
ELECTRODE REM PT RTRN 9FT ADLT (ELECTROSURGICAL) ×1 IMPLANT
EVACUATOR 1/8 PVC DRAIN (DRAIN) IMPLANT
GAUZE SPONGE 4X4 12PLY STRL (GAUZE/BANDAGES/DRESSINGS) ×4 IMPLANT
GLOVE SRG 8 PF TXTR STRL LF DI (GLOVE) ×1 IMPLANT
GLOVE SURG ORTHO LTX SZ7.5 (GLOVE) ×4 IMPLANT
GLOVE SURG UNDER POLY LF SZ7.5 (GLOVE) ×2 IMPLANT
GLOVE SURG UNDER POLY LF SZ8 (GLOVE) ×2
GOWN STRL REUS W/ TWL LRG LVL3 (GOWN DISPOSABLE) ×1 IMPLANT
GOWN STRL REUS W/ TWL XL LVL3 (GOWN DISPOSABLE) ×2 IMPLANT
GOWN STRL REUS W/TWL LRG LVL3 (GOWN DISPOSABLE) ×2
GOWN STRL REUS W/TWL XL LVL3 (GOWN DISPOSABLE) ×4
K-WIRE 1.25 TRCR POINT 150 (WIRE) ×2
K-WIRE 1.6X150 (WIRE) ×2
K-WIRE 2.0X150M (WIRE) ×2
KIT BASIN OR (CUSTOM PROCEDURE TRAY) ×2 IMPLANT
KIT INFUSE MEDIUM (Orthopedic Implant) ×1 IMPLANT
KIT TURNOVER KIT B (KITS) ×2 IMPLANT
KWIRE 1.25 TRCR POINT 150 (WIRE) IMPLANT
KWIRE 1.6X150 (WIRE) IMPLANT
KWIRE 2.0X150M (WIRE) IMPLANT
MANIFOLD NEPTUNE II (INSTRUMENTS) ×2 IMPLANT
NDL HYPO 25X1 1.5 SAFETY (NEEDLE) IMPLANT
NEEDLE HYPO 25X1 1.5 SAFETY (NEEDLE) IMPLANT
NS IRRIG 1000ML POUR BTL (IV SOLUTION) ×2 IMPLANT
PACK ORTHO EXTREMITY (CUSTOM PROCEDURE TRAY) ×2 IMPLANT
PAD ARMBOARD 7.5X6 YLW CONV (MISCELLANEOUS) ×4 IMPLANT
PAD CAST 3X4 CTTN HI CHSV (CAST SUPPLIES) IMPLANT
PAD CAST 4YDX4 CTTN HI CHSV (CAST SUPPLIES) IMPLANT
PADDING CAST COTTON 3X4 STRL (CAST SUPPLIES) ×2
PADDING CAST COTTON 4X4 STRL (CAST SUPPLIES) ×2
PLATE DIS HUM MED VA LCP 4H (Plate) ×1 IMPLANT
PLATE LOCK HUM 2.7X127 7H RT (Plate) ×1 IMPLANT
PLATE OLECRANON PROX 2.7/3.5 (Plate) ×1 IMPLANT
SCREW 2.7X60MM (Screw) ×1 IMPLANT
SCREW CORTEX 3.5 20MM (Screw) ×1 IMPLANT
SCREW CORTEX 3.5 22MM (Screw) ×2 IMPLANT
SCREW CORTEX 3.5 24MM (Screw) ×4 IMPLANT
SCREW CORTEX 3.5 26MM (Screw) ×1 IMPLANT
SCREW LOCK CORT ST 3.5X20 (Screw) IMPLANT
SCREW LOCK CORT ST 3.5X22 (Screw) IMPLANT
SCREW LOCK CORT ST 3.5X24 (Screw) IMPLANT
SCREW LOCK CORT ST 3.5X26 (Screw) IMPLANT
SCREW LOCK SELF-TAP 2.7X52MM (Screw) ×1 IMPLANT
SCREW LOCK T15 FT 22X3.5XST (Screw) IMPLANT
SCREW LOCK T8 22X2.7XST VA (Screw) IMPLANT
SCREW LOCK T8 24X2.7XSTVA (Screw) IMPLANT
SCREW LOCK VA ST 2.7X10 (Screw) ×1 IMPLANT
SCREW LOCK VA ST 2.7X12 (Screw) ×1 IMPLANT
SCREW LOCK VA ST 2.7X20 (Screw) ×2 IMPLANT
SCREW LOCKING 2.7X22MM (Screw) ×2 IMPLANT
SCREW LOCKING 2.7X24MM (Screw) ×4 IMPLANT
SCREW LOCKING 2.7X60MM (Screw) ×2 IMPLANT
SCREW LOCKING 3.5X22 (Screw) ×2 IMPLANT
SCREW LOCKING VA 2.7X54MM (Screw) ×1 IMPLANT
SCREW METAPHYSEAL 2.7X24MM (Screw) ×1 IMPLANT
SCREW SLF TPNG 2.7X26MM META (Screw) ×1 IMPLANT
SPONGE T-LAP 18X18 ~~LOC~~+RFID (SPONGE) IMPLANT
STAPLER VISISTAT 35W (STAPLE) ×2 IMPLANT
STOCKINETTE IMPERVIOUS 9X36 MD (GAUZE/BANDAGES/DRESSINGS) IMPLANT
SUCTION FRAZIER HANDLE 10FR (MISCELLANEOUS) ×2
SUCTION TUBE FRAZIER 10FR DISP (MISCELLANEOUS) ×1 IMPLANT
SUT ETHILON 2 0 FS 18 (SUTURE) ×6 IMPLANT
SUT VIC AB 0 CT1 27 (SUTURE) ×4
SUT VIC AB 0 CT1 27XBRD ANBCTR (SUTURE) ×2 IMPLANT
SUT VIC AB 2-0 CT1 27 (SUTURE) ×4
SUT VIC AB 2-0 CT1 TAPERPNT 27 (SUTURE) ×2 IMPLANT
SYR 5ML LL (SYRINGE) IMPLANT
SYR CONTROL 10ML LL (SYRINGE) IMPLANT
TOWEL GREEN STERILE (TOWEL DISPOSABLE) ×6 IMPLANT
TOWEL GREEN STERILE FF (TOWEL DISPOSABLE) ×2 IMPLANT
TRAY FOLEY MTR SLVR 16FR STAT (SET/KITS/TRAYS/PACK) IMPLANT
WATER STERILE IRR 1000ML POUR (IV SOLUTION) ×2 IMPLANT
YANKAUER SUCT BULB TIP NO VENT (SUCTIONS) IMPLANT

## 2021-08-09 NOTE — Progress Notes (Signed)
? ?[facility] ?PROGRESS NOTE ? ? ?Teresa Lin SAY:[id number] DOB: 1931-08-08 DOA: 08/08/2021  1 ?DOS: the patient was seen and examined on 08/09/2021 ? ?PCP: Adin Hector, MD ? ?Brief History and Hospital Course:  ?88 year old female with a history of rheumatoid arthritis, hypertension, history of DVT and PE on chronic Eliquis therapy presented to the ER after sustaining a fall at home.  This was a mechanical fall.  Evaluation at the [facility] ER revealed a compound fracture of the right humerus.  Due to lack of availability of the appropriate specialist in that hospital, patient was transferred here to [facility] for further management. Last dose of Eliquis was around 10 PM on 08/07/2021.  ? ?Consultants: Orthopedics ? ?Procedures:   ?1.  OPEN REDUCTION INTERNAL FIXATION OF RIGHT TRANSCONDYLAR HUMERUS FRACTURE. ?2.  ULNAR NERVE NEUROPLASTY. ?3.  ULNA OSTEOTOMY. ?4.  SURGICAL DEBRIDEMENT OF OPEN FRACTURE INCLUDING REMOVAL OF BONE ? ? ? ?Subjective: ?Patient seen after she returned from the OR.  Still drowsy but easily arousable.  Son is at the bedside. ? ? ?Assessment/Plan: ? ? ?* Open fracture of right humerus ?Result of a mechanical fall.  Patient went to surgery this morning. Sedated currently. ?Continue pain meds.  ?Resumption of Eliquis per orthopedic surgery.   ?PT and OT after surgery. ? ?History of pulmonary embolism ?On Eliquis prior to admission.  Currently on hold for surgery.   ? ?Immunosuppression ([place]) ?On MTX and Orencia. ? ?Rheumatoid arthritis of multiple sites without rheumatoid factor ([place]) ?On methotrexate, plaquenil and Orencia as outpatient.  ? ?Hypokalemia ?Will be repleted.  Magnesium is 1.9.  ? ? ? ?DVT Prophylaxis: Per orthopedics.  Was on Eliquis prior to admission. ?Code Status: Full code ?Family Communication: Discussed with the son ?Disposition Plan: To be determined.  Patient lives by herself. ? ?Status is: Inpatient ?Remains inpatient appropriate because: Humeral fracture  requiring surgery. ? ? ? ? ?Medications: Scheduled: ? calcium-vitamin D  1 tablet Oral Daily  ? donepezil  5 mg Oral QHS  ? fentaNYL      ? folic acid  2 mg Oral Daily  ? hydroxychloroquine  200 mg Oral Daily  ? ?Continuous: ?  ceFAZolin (ANCEF) IV 2 g (08/08/21 2238)  ? lactated ringers 500 mL/hr at 08/09/21 1234  ? ?ATF:[id number] **OR** acetaminophen, melatonin, naLOXone (NARCAN)  injection, ondansetron **OR** ondansetron (ZOFRAN) IV, oxyCODONE **OR** oxyCODONE ? ?Antibiotics: ?Anti-infectives (From admission, onward)  ? ? Start     Dose/Rate Route Frequency Ordered Stop  ? 08/09/21 1000  hydroxychloroquine (PLAQUENIL) tablet 200 mg       ? 200 mg Oral Daily 08/08/21 2158    ? 08/08/21 2300  ceFAZolin (ANCEF) IVPB 2g/100 mL premix       ? 2 g ?200 mL/hr over 30 Minutes Intravenous Every 8 hours 08/08/21 2205 08/09/21 2259  ? ?  ? ? ?Objective: ? ?Vital Signs ? ?Vitals:  ? 08/09/21 1254 08/09/21 1309 08/09/21 1324 08/09/21 1343  ?BP: (!) 116/53 (!) 110/52 (!) 117/57 (!) 117/56  ?Pulse: 73 73 72 68  ?Resp: '20 18 19 18  '$ ?Temp:   (!) 97.4 ?F (36.3 ?C) (!) 97.5 ?F (36.4 ?C)  ?TempSrc:    Oral  ?SpO2: 97% 96% 97% 97%  ?Weight:      ?Height:      ? ? ?Intake/Output Summary (Last 24 hours) at 08/09/2021 1416 ?Last data filed at 08/09/2021 1235 ?Gross per 24 hour  ?Intake --  ?Output 1100 ml  ?Net -  1100 ml  ? ?Filed Weights  ? 08/08/21 2119  ?Weight: 61.7 kg  ? ? ?General appearance: Sleepy but arousable ?Resp: Clear to auscultation bilaterally.  Normal effort ?Cardio: S1-S2 is normal regular.  No S3-S4.  No rubs murmurs or bruit ?GI: Abdomen is soft.  Nontender nondistended.  Bowel sounds are present normal.  No masses organomegaly ?Extremities: Right arm is in a sling ?Neurologic:  No focal neurological deficits.  ? ? ? ?Lab Results: ? ?Data Reviewed: I have personally reviewed labs and imaging study reports ? ?CBC: ?Recent Labs  ?Lab 08/08/21 ?1333 08/09/21 ?0135  ?WBC 9.0 7.9  ?NEUTROABS 7.7 6.2  ?HGB 13.3 12.3  ?HCT  41.3 36.6  ?MCV 96.9 94.8  ?PLT 292 281  ? ? ?Basic Metabolic Panel: ?Recent Labs  ?Lab 08/08/21 ?1333 08/09/21 ?0135  ?NA 137 138  ?K 3.8 3.3*  ?CL 100 103  ?CO2 26 25  ?GLUCOSE 143* 121*  ?BUN 18 11  ?CREATININE 0.63 0.55  ?CALCIUM 8.8* 8.4*  ?MG  --  1.9  ? ? ?GFR: ?Estimated Creatinine Clearance: 46.4 mL/min (by C-G formula based on SCr of 0.55 mg/dL). ? ?Liver Function Tests: ?Recent Labs  ?Lab 08/09/21 ?0135  ?AST 29  ?ALT 16  ?ALKPHOS 41  ?BILITOT 0.6  ?PROT 6.1*  ?ALBUMIN 3.3*  ? ? ? ?Coagulation Profile: ?Recent Labs  ?Lab 08/08/21 ?1333  ?INR 1.1  ? ? ? ? ?Recent Results (from the past 240 hour(s))  ?Resp Panel by RT-PCR (Flu A&B, Covid) Nasopharyngeal Swab     Status: None  ? Collection Time: 08/08/21  1:33 PM  ? Specimen: Nasopharyngeal Swab; Nasopharyngeal(NP) swabs in vial transport medium  ?Result Value Ref Range Status  ? SARS Coronavirus 2 by RT PCR NEGATIVE NEGATIVE Final  ?  Comment: (NOTE) ?SARS-CoV-2 target nucleic acids are NOT DETECTED. ? ?The SARS-CoV-2 RNA is generally detectable in upper respiratory ?specimens during the acute phase of infection. The lowest ?concentration of SARS-CoV-2 viral copies this assay can detect is ?138 copies/mL. A negative result does not preclude SARS-Cov-2 ?infection and should not be used as the sole basis for treatment or ?other patient management decisions. A negative result may occur with  ?improper specimen collection/handling, submission of specimen other ?than nasopharyngeal swab, presence of viral mutation(s) within the ?areas targeted by this assay, and inadequate number of viral ?copies(<138 copies/mL). A negative result must be combined with ?clinical observations, patient history, and epidemiological ?information. The expected result is Negative. ? ?Fact Sheet for Patients:  ?EntrepreneurPulse.com.au ? ?Fact Sheet for Healthcare Providers:  ?IncredibleEmployment.be ? ?This test is no t yet approved or cleared by the  [place] FDA and  ?has been authorized for detection and/or diagnosis of SARS-CoV-2 by ?FDA under an Emergency Use Authorization (EUA). This EUA will remain  ?in effect (meaning this test can be used) for the duration of the ?COVID-19 declaration under Section 564(b)(1) of the Act, 21 ?U.S.C.section 360bbb-3(b)(1), unless the authorization is terminated  ?or revoked sooner.  ? ? ?  ? Influenza A by PCR NEGATIVE NEGATIVE Final  ? Influenza B by PCR NEGATIVE NEGATIVE Final  ?  Comment: (NOTE) ?The Xpert Xpress SARS-CoV-2/FLU/RSV plus assay is intended as an aid ?in the diagnosis of influenza from Nasopharyngeal swab specimens and ?should not be used as a sole basis for treatment. Nasal washings and ?aspirates are unacceptable for Xpert Xpress SARS-CoV-2/FLU/RSV ?testing. ? ?Fact Sheet for Patients: ?EntrepreneurPulse.com.au ? ?Fact Sheet for Healthcare Providers: ?IncredibleEmployment.be ? ?This test is not  yet approved or cleared by the [place] and ?has been authorized for detection and/or diagnosis of SARS-CoV-2 by ?FDA under an Emergency Use Authorization (EUA). This EUA will remain ?in effect (meaning this test can be used) for the duration of the ?COVID-19 declaration under Section 564(b)(1) of the Act, 21 U.S.C. ?section 360bbb-3(b)(1), unless the authorization is terminated or ?revoked. ? ?Performed at [facility], [place], ?[place] [address] ?  ?Surgical pcr screen     Status: None  ? Collection Time: 08/09/21 12:06 AM  ? Specimen: Nasal Mucosa; Nasal Swab  ?Result Value Ref Range Status  ? MRSA, PCR NEGATIVE NEGATIVE Final  ? Staphylococcus aureus NEGATIVE NEGATIVE Final  ?  Comment: (NOTE) ?The Xpert SA Assay (FDA approved for NASAL specimens in patients 43 ?years of age and older), is one component of a comprehensive ?surveillance program. It is not intended to diagnose infection nor to ?guide or monitor treatment. ?Performed at  [facility], [place] [address], [place], [place] ?[address] ?  ?  ? ? ?Radiology Studies: ?DG Shoulder Right ? ?Result Date: 08/08/2021 ?CLINICAL DATA:  Fall, pain EXAM: RIGHT SHOULDER - 2+ VIEW COMPARISON

## 2021-08-09 NOTE — Plan of Care (Signed)

## 2021-08-09 NOTE — Assessment & Plan Note (Addendum)
Supplemented 

## 2021-08-09 NOTE — Hospital Course (Addendum)
88 year old female with a history of rheumatoid arthritis, hypertension, history of DVT and PE on chronic Eliquis therapy presented to the ER after sustaining a fall at home.  This was a mechanical fall.  Evaluation at the [facility] ER revealed a compound fracture of the right humerus.  Due to lack of availability of the appropriate specialist in that hospital, patient was transferred here to [facility] for further management. Last dose of Eliquis was around 10 PM on 08/07/2021.  Patient underwent ORIF on 3/30. ?

## 2021-08-09 NOTE — Consult Note (Signed)
? ?     Orthopaedic Trauma Service (OTS) Consultation  ? ?Patient ID: ?Teresa Lin ?MRN: [id number] ?DOB/AGE: 08-16-1931 88 y.o. ? ? ?Reason for Consult: Right open transcondylar fracture ?Referring Physician: Leim Fabry, MD ? ?HPI: Teresa Lin is an 88 y.o. female with ground level fall. On Eliquis and immunosuppressive meds for RA. Given the open wound and complexity of the fracture, outside ortho at [facility] Dr. Leim Fabry asserted this was outside his scope of practice and that it would be in the best interest of the patient to have these injuries evaluated and treated by a fellowship trained orthopaedic traumatologist. Consequently, I was consulted to provide further evaluation and management and she was transferred here after admin of antibiotics. Patient has received nerve block. ? ? ?Past Medical History:  ?Diagnosis Date  ? Anemia   ? Anxiety   ? Arthritis   ? osteoarthritis. Bilateral knee replacement, hands, meniscal tear, cervical disc disease  ? Atherosclerosis   ? Atypical chest pain   ? Collagen vascular disease ([place])   ? Colon polyp 12/15/2017  ? Colon polyps   ? Compression fracture of L2 ([place])   ? Diverticulosis   ? DVT (deep venous thrombosis) ([place])   ? Fracture of one rib, right side, subsequent encounter for fracture with routine healing 03/08/2017  ? GERD (gastroesophageal reflux disease)   ? History of seronegative inflammatory arthritis   ? HOH (hard of hearing)   ? no hearing aids  ? Hypertension   ? IBS (irritable bowel syndrome)   ? Internal hemorrhoids   ? Phlebitis and thrombophlebitis of deep veins of lower extremities (HCC)   ? after surgery  ? Vision abnormalities   ? ? ?Past Surgical History:  ?Procedure Laterality Date  ? APPENDECTOMY    ? COLONOSCOPY N/A 01/24/2020  ? Procedure: COLONOSCOPY;  Surgeon: Lesly Rubenstein, MD;  Location: [facility] ENDOSCOPY;  Service: Endoscopy;  Laterality: N/A;  ? COLONOSCOPY WITH PROPOFOL N/A 10/24/2017  ? Procedure: COLONOSCOPY WITH PROPOFOL;   Surgeon: Manya Silvas, MD;  Location: [facility] ENDOSCOPY;  Service: Endoscopy;  Laterality: N/A;  ? COLONOSCOPY WITH PROPOFOL N/A 11/21/2017  ? Procedure: COLONOSCOPY WITH PROPOFOL;  Surgeon: Manya Silvas, MD;  Location: [facility] ENDOSCOPY;  Service: Endoscopy;  Laterality: N/A;  ? DILATION AND CURETTAGE OF UTERUS    ? ESOPHAGOGASTRODUODENOSCOPY (EGD) WITH PROPOFOL N/A 02/25/2018  ? Procedure: ESOPHAGOGASTRODUODENOSCOPY (EGD) WITH PROPOFOL;  Surgeon: Manya Silvas, MD;  Location: [facility] ENDOSCOPY;  Service: Endoscopy;  Laterality: N/A;  ? EYE SURGERY    ? bilateral catarACT  ? JOINT REPLACEMENT Bilateral   ? knee replacement  ? LAPAROSCOPIC RIGHT COLECTOMY Right 12/15/2017  ? Procedure: LAPAROSCOPIC RIGHT COLECTOMY;  Surgeon: Robert Bellow, MD;  Location: [facility] ORS;  Service: General;  Laterality: Right;  ? REPLACEMENT TOTAL KNEE BILATERAL    ? TOTAL SHOULDER ARTHROPLASTY Right 09/24/2018  ? Procedure: TOTAL SHOULDER ARTHROPLASTY - REVERSE RIGHT;  Surgeon: Corky Mull, MD;  Location: [facility] ORS;  Service: Orthopedics;  Laterality: Right;  ? ? ?Family History  ?Problem Relation Age of Onset  ? Hypertension Mother   ? Cancer Mother 11  ?     colon ca  ? Heart attack Mother   ? Hypertension Father   ? Heart attack Father   ? Hypertension Sister   ? Heart attack Sister   ? Multiple sclerosis Daughter   ? Multiple sclerosis Son   ? Multiple sclerosis Son   ? ? ?Social History:  reports that she has never smoked. She has never used smokeless tobacco. She reports current alcohol use of about 2.0 standard drinks per week. She reports that she does not use drugs. ? ?Allergies:  ?Allergies  ?Allergen Reactions  ? Infliximab Other (See Comments)  ?  Shaking, pain  ? Lidocaine-Menthol   ? Synvisc [[place] G-F 20] Swelling  ? ? ?Medications: Prior to Admission:  ?Medications Prior to Admission  ?Medication Sig Dispense Refill Last Dose  ? Abatacept (ORENCIA IV) Inject into the vein every 30 (thirty) days.     ? APIXABAN  (ELIQUIS) VTE STARTER PACK ('10MG'$  AND '5MG'$ ) Take as directed on package: start with two-'5mg'$  tablets twice daily for 7 days. On day 8, switch to one-'5mg'$  tablet twice daily. 1 each 0   ? aspirin EC 325 MG tablet Take 1 tablet (325 mg total) by mouth daily. 30 tablet 0   ? calcium-vitamin D (OSCAL WITH D) 500-200 MG-UNIT per tablet Take 1 tablet by mouth daily.      ? folic acid (FOLVITE) 1 MG tablet Take 2 mg by mouth daily.      ? hydroxychloroquine (PLAQUENIL) 200 MG tablet Take 200 mg by mouth daily.     ? methotrexate 50 MG/2ML injection Inject 20 mg into the skin every Sunday.      ? Multiple Vitamins-Minerals (MULTIVITAMIN WITH MINERALS) tablet Take 1 tablet by mouth daily. Centrum Silver     ? ondansetron (ZOFRAN ODT) 4 MG disintegrating tablet Take 1 tablet (4 mg total) by mouth every 6 (six) hours as needed for nausea or vomiting. 20 tablet 0   ? Polyethyl Glycol-Propyl Glycol 0.4-0.3 % SOLN Place 1 drop into both eyes 3 (three) times daily as needed (for eye burning/irritation).     ? ? ?Results for orders placed or performed during the hospital encounter of 08/08/21 (from the past 48 hour(s))  ?Surgical pcr screen     Status: None  ? Collection Time: 08/09/21 12:06 AM  ? Specimen: Nasal Mucosa; Nasal Swab  ?Result Value Ref Range  ? MRSA, PCR NEGATIVE NEGATIVE  ? Staphylococcus aureus NEGATIVE NEGATIVE  ?  Comment: (NOTE) ?The Xpert SA Assay (FDA approved for NASAL specimens in patients 13 ?years of age and older), is one component of a comprehensive ?surveillance program. It is not intended to diagnose infection nor to ?guide or monitor treatment. ?Performed at [facility], [place] [address] [place], [place] ?[address] ?  ?Magnesium     Status: None  ? Collection Time: 08/09/21  1:35 AM  ?Result Value Ref Range  ? Magnesium 1.9 1.7 - 2.4 mg/dL  ?  Comment: Performed at [facility], [place] [address] [place], [place] [address]  ?CBC with Differential/Platelet     Status: Abnormal  ? Collection Time:  08/09/21  1:35 AM  ?Result Value Ref Range  ? WBC 7.9 4.0 - 10.5 K/uL  ? RBC 3.86 (L) 3.87 - 5.11 MIL/uL  ? Hemoglobin 12.3 12.0 - 15.0 g/dL  ? HCT 36.6 36.0 - 46.0 %  ? MCV 94.8 80.0 - 100.0 fL  ? MCH 31.9 26.0 - 34.0 pg  ? MCHC 33.6 30.0 - 36.0 g/dL  ? RDW 13.5 11.5 - 15.5 %  ? Platelets 281 150 - 400 K/uL  ? nRBC 0.0 0.0 - 0.2 %  ? Neutrophils Relative % 78 %  ? Neutro Abs 6.2 1.7 - 7.7 K/uL  ? Lymphocytes Relative 12 %  ? Lymphs Abs 1.0 0.7 -  4.0 K/uL  ? Monocytes Relative 9 %  ? Monocytes Absolute 0.7 0.1 - 1.0 K/uL  ? Eosinophils Relative 0 %  ? Eosinophils Absolute 0.0 0.0 - 0.5 K/uL  ? Basophils Relative 0 %  ? Basophils Absolute 0.0 0.0 - 0.1 K/uL  ? Immature Granulocytes 1 %  ? Abs Immature Granulocytes 0.06 0.00 - 0.07 K/uL  ?  Comment: Performed at [facility], [place] [address], [place], [place] [address]  ?Comprehensive metabolic panel     Status: Abnormal  ? Collection Time: 08/09/21  1:35 AM  ?Result Value Ref Range  ? Sodium 138 135 - 145 mmol/L  ? Potassium 3.3 (L) 3.5 - 5.1 mmol/L  ? Chloride 103 98 - 111 mmol/L  ? CO2 25 22 - 32 mmol/L  ? Glucose, Bld 121 (H) 70 - 99 mg/dL  ?  Comment: Glucose reference range applies only to samples taken after fasting for at least 8 hours.  ? BUN 11 8 - 23 mg/dL  ? Creatinine, Ser 0.55 0.44 - 1.00 mg/dL  ? Calcium 8.4 (L) 8.9 - 10.3 mg/dL  ? Total Protein 6.1 (L) 6.5 - 8.1 g/dL  ? Albumin 3.3 (L) 3.5 - 5.0 g/dL  ? AST 29 15 - 41 U/L  ? ALT 16 0 - 44 U/L  ? Alkaline Phosphatase 41 38 - 126 U/L  ? Total Bilirubin 0.6 0.3 - 1.2 mg/dL  ? GFR, Estimated >60 >60 mL/min  ?  Comment: (NOTE) ?Calculated using the CKD-EPI Creatinine Equation (2021) ?  ? Anion gap 10 5 - 15  ?  Comment: Performed at [facility], [place] [address], [place], [place] [address]  ? ? ?DG Shoulder Right ? ?Result Date: 08/08/2021 ?CLINICAL DATA:  Fall, pain EXAM: RIGHT SHOULDER - 2+ VIEW COMPARISON:  None. FINDINGS: Status post right shoulder reverse arthroplasty. No evidence of  perihardware fracture or component malpositioning. The acromioclavicular joint spaces preserved. Partially imaged right chest is unremarkable. IMPRESSION: Status post right shoulder reverse arthroplasty. No evidence of

## 2021-08-09 NOTE — Anesthesia Postprocedure Evaluation (Signed)
Anesthesia Post Note ? ?Patient: Teresa Lin ? ?Procedure(s) Performed: IRRIGATION AND DEBRIDEMENT; OPEN REDUCTION INTERNAL FIXATION (ORIF) DISTAL HUMERUS FRACTURE (Right) ? ?  ? ?Patient location during evaluation: PACU ?Anesthesia Type: General and Regional ?Level of consciousness: awake and alert ?Pain management: pain level controlled ?Vital Signs Assessment: post-procedure vital signs reviewed and stable ?Respiratory status: spontaneous breathing, nonlabored ventilation and respiratory function stable ?Cardiovascular status: blood pressure returned to baseline and stable ?Postop Assessment: no apparent nausea or vomiting ?Anesthetic complications: no ? ? ?No notable events documented. ? ?Last Vitals:  ?Vitals:  ? 08/09/21 1309 08/09/21 1324  ?BP: (!) 110/52 (!) 117/57  ?Pulse: 73 72  ?Resp: 18 19  ?Temp:  (!) 36.3 ?C  ?SpO2: 96% 97%  ?  ?Last Pain:  ?Vitals:  ? 08/09/21 1324  ?TempSrc:   ?PainSc: 0-No pain  ? ? ?  ?  ?  ?  ?  ?  ? ?Dilraj Killgore,W. EDMOND ? ? ? ? ?

## 2021-08-09 NOTE — Anesthesia Procedure Notes (Signed)
Procedure Name: Intubation ?Date/Time: 08/09/2021 8:30 AM ?Performed by: Gaylene Brooks, CRNA ?Pre-anesthesia Checklist: Patient identified, Emergency Drugs available, Suction available and Patient being monitored ?Patient Re-evaluated:Patient Re-evaluated prior to induction ?Oxygen Delivery Method: Circle System Utilized ?Preoxygenation: Pre-oxygenation with 100% oxygen ?Induction Type: IV induction ?Ventilation: Mask ventilation without difficulty ?Laryngoscope Size: Sabra Heck and 2 ?Grade View: Grade II ?Tube type: Oral ?Tube size: 7.0 mm ?Number of attempts: 1 ?Airway Equipment and Method: Stylet and Oral airway ?Placement Confirmation: ETT inserted through vocal cords under direct vision, positive ETCO2 and breath sounds checked- equal and bilateral ?Secured at: 22 cm ?Tube secured with: Tape ?Dental Injury: Teeth and Oropharynx as per pre-operative assessment  ? ? ? ? ?

## 2021-08-09 NOTE — Progress Notes (Signed)
Orthopedic Tech Progress Note ?Patient Details:  ?Teresa Lin ?01/16/1932 ?[id number] ? ?Ortho Devices ?Type of Ortho Device: Sling immobilizer ?Ortho Device/Splint Location: RUE ?Ortho Device/Splint Interventions: Application, Ordered, Adjustment ?  ?Post Interventions ?Patient Tolerated: Fair ? ?Vernona Rieger ?08/09/2021, 1:20 AM ? ?

## 2021-08-09 NOTE — Op Note (Signed)
08/09/2021 ? ?12:39 PM ? ?PATIENT:  Teresa Lin  05/17/1931 female  ? ?MEDICAL RECORD NUMBER: [id number] ? ?PREOPERATIVE DIAGNOSES: TYPE 2 OPEN COMMINUTED RIGHT TRANSCONDYLAR DISTAL HUMERUS FRACTURE. ? ?POSTOPERATIVE DIAGNOSES: TYPE 2 OPEN COMMINUTED RIGHT TRANSCONDYLAR DISTAL HUMERUS FRACTURE. ? ?PROCEDURE: ?1.  OPEN REDUCTION INTERNAL FIXATION OF RIGHT TRANSCONDYLAR HUMERUS FRACTURE. ?2.  ULNAR NERVE NEUROPLASTY. ?3.  ULNA OSTEOTOMY. ?4.  SURGICAL DEBRIDEMENT OF OPEN FRACTURE INCLUDING REMOVAL OF BONE ? ?SURGEON:  Altamese Riverside, MD ? ?ASSISTANT:  PA Student ? ?ANESTHESIA:  General, supplemented with regional block. ? ?COMPLICATIONS:  None. ? ?TOURNIQUET:  None. ? ?DISPOSITION:  To PACU. ? ?CONDITION:  Stable. ? ?BRIEF SUMMARY OF INDICATION FOR PROCEDURE:  The patient is right-hand dominant 88 y.o. who who fell sustaining severe elbow pain, deformity, open wound, and loss of function. I discussed with her the options for treatment including non-operative management, total elbow replacement, and surgical repair. Risks include malunion, nonunion, loss of reduction, loss of motion, DVT, PE, heart attack, stroke, heterotopic bone, and the need for multiple procedures including subsequent capsulectomy and removal of symptomatic hardware among others. After acknowledgement of these risks, consent was provided to proceed. ? ?BRIEF SUMMARY OF PROCEDURE:  The patient was given a regional block preoperatively, taken to the operating room where general anesthesia was induced. The patient was then placed laterally with the operative extremity up and over a padded bolster, with all prominences padded appropriately.  Chlorhexidine wash, then Betadine scrub and paint were performed followed by standard prep and drape.  Timeout was held. I began with the open wound, extending distal and proximal limbs vertically. This enabled me to expose the bone ends. Using the scalpel, I sharply excised injured skin, subcutaneous tissue, and  muscle. I also removed contaminated free cortical bone segments devoid of soft tissue attachment and then the curettes to additionally excise contaminated cancellous bone. 1000 cc of saline were flushed through the exposed bone ends and wound, with my assistant helping to deliver the bone ends and supplementing with soap wash and rinse, as well.  Once the debridement was completed, we turned our attention to fracture repair. Using long posterior incision and approach to the triceps, I dissected the plane on the lateral side and then the medial. Medial dissection was quite tedious and there were small fragments and muscle injury around the ulnar nerve, increasing the risk of subsequent fibrosis and scarring. Consequently, the decision to proceed with formal neuroplasty was made. I placed a Penrose drain gently around the ulnar nerve, and with the help of my assistant, mobilized it in various directions while carefully releasing the adjacent tissue until it was completely free, past the cubital tunnel into the forearm, and up to the intermuscular septum proximally. Bipolar cautery was employed when in proximity of the nerve. Once this was complete, I then went along the lateral aspect and mobilized the triceps fascia, heading distally down to the ulna and sliding the corner of a lap sponge underneath the articular surface in the bare spot of the olecranon.  Normally, I would attempt distal humerus repair without bone cut. However efforts to do so were not successful. In this case then I chose to perform an ulnar osteotomy using the [employer] technique.  Using a microsagittal saw, I made the [employer] cut and completed it with the osteotome. The triceps was reflected exposing the severely comminuted fracture into the intercondylar notch and trochlear spool.  I was able to clean the fracture edges with a curette and lavage  and tease them together with a dental pick.  This was followed with a small K-wire fixation using a  0.045 K-wire.  After this was complete, I then took additional K wires and secured the articular surface to the lateral column and an additional one in the medial column.  C-arm was brought  ?in.  It did show substantial comminution as previously noted, but sufficient to allow for plate osteosynthesis.  The correct reduction was dialed in anatomically on the posteromedial cortex and secured with definitive fixation. I then used lateral plate securing fixation into the capitellum and up the lateral column, including fixation of the trochlear spool, checking its position on orthogonal views with fluoro.  Lastly, fixation was achieved proximally in the medial plate as well.  I then reduced the osteotomy and placed the synthes plate, securing fixation distally to compress the osteotomy, which was followed by additional small locked screws proximally and a 3.5 screw distally.  There were no complications during the procedure.  The patient underwent a thorough irrigation and a layered closure using #0 PDS for the triceps and then 2-0 PDS, and nylon.  Prior to beginning the closure, I did use an inverted 2-0 PDS stitch to bring together the deep fascia over the plate, allowing the ulnar nerve to remain untethered within the cubital tunnel.  An assistant was required for this case given its considerable technical difficulty, and this was helpful during provisional and definitive fixation, retraction, protection of the ulnar nerve, and closure.  A gently compressive dressing was applied from hand to upper arm.  The patient was taken to the PACU in stable condition after application of a sling. ? ?PROGNOSIS:  The patient has a rather severely comminuted distal humerus fracture with underlying osteoporosis.  Consequently, this would be anticipated to result in some loss of motion, but if we can obtain healing, we will ultimately not have any restrictions with regard to resistance. Her thin skin, body habitus, and  immunosuppressants increase likelihood of symptomatic hardware considerably which could require removal. ?

## 2021-08-09 NOTE — Anesthesia Procedure Notes (Signed)
Anesthesia Regional Block: Interscalene brachial plexus block  ? ?Pre-Anesthetic Checklist: , timeout performed,  Correct Patient, Correct Site, Correct Laterality,  Correct Procedure, Correct Position, site marked,  Risks and benefits discussed,  Surgical consent,  Pre-op evaluation,  At surgeon's request and post-op pain management ? ?Laterality: Right ? ?Prep: chloraprep     ?  ?Needles:  ?Injection technique: Single-shot ? ?Needle Type: Echogenic Stimulator Needle   ? ? ?Needle Length: 5cm  ?Needle Gauge: 22  ? ? ? ?Additional Needles: ? ? ?Narrative:  ?Start time: 08/09/2021 7:49 AM ?End time: 08/09/2021 7:59 AM ?Injection made incrementally with aspirations every 5 mL. ? ?Performed by: Personally  ?Anesthesiologist: Duane Boston, MD ? ?Additional Notes: ?Functioning IV was confirmed and monitors applied.  A 15m 22ga echogenic arrow stimulator was used. Sterile prep and drape,hand hygiene and sterile gloves were used.Ultrasound guidance: relevant anatomy identified, needle position confirmed, local anesthetic spread visualized around nerve(s)., vascular puncture avoided.  Image printed for medical record.  Negative aspiration and negative test dose prior to incremental administration of local anesthetic. The patient tolerated the procedure well. ? ? ? ? ?

## 2021-08-09 NOTE — Transfer of Care (Signed)
Immediate Anesthesia Transfer of Care Note ? ?Patient: Teresa Lin ? ?Procedure(s) Performed: IRRIGATION AND DEBRIDEMENT; OPEN REDUCTION INTERNAL FIXATION (ORIF) DISTAL HUMERUS FRACTURE (Right) ? ?Patient Location: PACU ? ?Anesthesia Type:GA combined with regional for post-op pain ? ?Level of Consciousness: awake, drowsy and patient cooperative ? ?Airway & Oxygen Therapy: Patient Spontanous Breathing and Patient connected to nasal cannula oxygen ? ?Post-op Assessment: Report given to RN, Post -op Vital signs reviewed and stable and Patient moving all extremities X 4 ? ?Post vital signs: Reviewed and stable ? ?Last Vitals:  ?Vitals Value Taken Time  ?BP 121/55 08/09/21 1239  ?Temp    ?Pulse 94 08/09/21 1242  ?Resp 25 08/09/21 1242  ?SpO2 95 % 08/09/21 1242  ?Vitals shown include unvalidated device data. ? ?Last Pain:  ?Vitals:  ? 08/09/21 0810  ?TempSrc:   ?PainSc: 0-No pain  ?   ? ?Patients Stated Pain Goal: 3 (08/09/21 0800) ? ?Complications: No notable events documented. ?

## 2021-08-09 NOTE — Progress Notes (Signed)
Pt son and medical POA [name] called and notified that pt had been picked up for surgery per his request, also notified that pt life alert and ring placed in pink container in pt closet he will be by to pick up. ? ?Pt son [name] [phone] would like to be called for updates and when pt is in PACU  ?

## 2021-08-10 ENCOUNTER — Encounter (HOSPITAL_COMMUNITY): Payer: Self-pay | Admitting: Orthopedic Surgery

## 2021-08-10 DIAGNOSIS — E876 Hypokalemia: Secondary | ICD-10-CM | POA: Diagnosis not present

## 2021-08-10 DIAGNOSIS — M0609 Rheumatoid arthritis without rheumatoid factor, multiple sites: Secondary | ICD-10-CM | POA: Diagnosis not present

## 2021-08-10 DIAGNOSIS — Z86711 Personal history of pulmonary embolism: Secondary | ICD-10-CM | POA: Diagnosis not present

## 2021-08-10 DIAGNOSIS — S42491B Other displaced fracture of lower end of right humerus, initial encounter for open fracture: Secondary | ICD-10-CM | POA: Diagnosis not present

## 2021-08-10 LAB — BASIC METABOLIC PANEL
Anion gap: 7 (ref 5–15)
BUN: 9 mg/dL (ref 8–23)
CO2: 26 mmol/L (ref 22–32)
Calcium: 8.3 mg/dL — ABNORMAL LOW (ref 8.9–10.3)
Chloride: 101 mmol/L (ref 98–111)
Creatinine, Ser: 0.53 mg/dL (ref 0.44–1.00)
GFR, Estimated: >60 mL/min (ref >60)
Glucose, Bld: 106 mg/dL — ABNORMAL HIGH (ref 70–99)
Potassium: 4 mmol/L (ref 3.5–5.1)
Sodium: 134 mmol/L — ABNORMAL LOW (ref 135–145)

## 2021-08-10 LAB — CBC
HCT: 34.4 % — ABNORMAL LOW (ref 36.0–46.0)
Hemoglobin: 11.3 g/dL — ABNORMAL LOW (ref 12.0–15.0)
MCH: 31.5 pg (ref 26.0–34.0)
MCHC: 32.8 g/dL (ref 30.0–36.0)
MCV: 95.8 fL (ref 80.0–100.0)
Platelets: 255 10*3/uL (ref 150–400)
RBC: 3.59 MIL/uL — ABNORMAL LOW (ref 3.87–5.11)
RDW: 13.7 % (ref 11.5–15.5)
WBC: 11.5 10*3/uL — ABNORMAL HIGH (ref 4.0–10.5)
nRBC: 0 % (ref 0.0–0.2)

## 2021-08-10 MED ORDER — ADULT MULTIVITAMIN W/MINERALS CH
1.0000 | ORAL_TABLET | Freq: Every day | ORAL | Status: DC
  Administered 2021-08-10 – 2021-08-13 (×4): 1 via ORAL
  Filled 2021-08-10 (×4): qty 1

## 2021-08-10 MED ORDER — APIXABAN 5 MG PO TABS
5.0000 mg | ORAL_TABLET | Freq: Two times a day (BID) | ORAL | Status: DC
  Administered 2021-08-10 – 2021-08-13 (×7): 5 mg via ORAL
  Filled 2021-08-10 (×7): qty 1

## 2021-08-10 MED ORDER — ENSURE ENLIVE PO LIQD
237.0000 mL | Freq: Two times a day (BID) | ORAL | Status: DC
  Administered 2021-08-10 – 2021-08-13 (×5): 237 mL via ORAL

## 2021-08-10 MED ORDER — ACETAMINOPHEN 500 MG PO TABS
1000.0000 mg | ORAL_TABLET | Freq: Three times a day (TID) | ORAL | Status: DC
  Administered 2021-08-11 – 2021-08-13 (×6): 1000 mg via ORAL
  Filled 2021-08-10 (×6): qty 2

## 2021-08-10 MED ORDER — MORPHINE SULFATE (PF) 2 MG/ML IV SOLN
1.0000 mg | INTRAVENOUS | Status: AC | PRN
  Administered 2021-08-10: 1 mg via INTRAVENOUS
  Filled 2021-08-10: qty 1

## 2021-08-10 NOTE — Progress Notes (Signed)
Initial Nutrition Assessment ? ?DOCUMENTATION CODES:  ?Not applicable ? ?INTERVENTION:  ?Continue current diet as ordered ?Ensure Enlive po BID, each supplement provides 350 kcal and 20 grams of protein. ?MVI with minerals daily ? ?NUTRITION DIAGNOSIS:  ?Increased nutrient needs related to post-op healing as evidenced by estimated needs. ? ?GOAL:  ?Patient will meet greater than or equal to 90% of their needs ? ?MONITOR:  ?PO intake, Supplement acceptance, I & O's ? ?REASON FOR ASSESSMENT:  ?Consult ?Assessment of nutrition requirement/status ? ?ASSESSMENT:  ?88 year old female with history of rheumatoid arthritis, HTN, GERD, IBS, and diverticulosis presented to ER after having a fall at home onto her right arm. Imaging in ED showed an open compound fracture of the right humerus ? ?Pt resting in bed at the time of assessment, recently finished working with therapy. Pt reports a good appetite here and PTA. States appetite is at baseline. Inquired about weight, pt reports that she is stable at about 140 lbs. Current weight in chart is incorrect, obtained bed weight which was 143 lb.  ? ?Pt reports eating 3x/d at home, sometimes snacks between. Also occasionally drinks ensure. Agreeable to receiving while admitted to meet post-op healing needs.  ? ?3/30 - Op, ORIF of the right transcondylar humerus ? ?Average Meal Intake: ?3/31: 75% intake x 1 recorded meals ? ?Nutritionally Relevant Medications: ?Scheduled Meds: ? vitamin C  1,000 mg Oral Daily  ? calcium-vitamin D  1 tablet Oral Daily  ? Ensure Enlive  237 mL Oral BID BM  ? folic acid  2 mg Oral Daily  ? multivitamin with minerals  1 tablet Oral Daily  ? ?PRN Meds: ondansetron ? ?Labs Reviewed: ?Sodium 134 ?Vitamin D 80.16 ? ?NUTRITION - FOCUSED PHYSICAL EXAM: ?No weight changes or poor PO, deficits likely related to normal aging versus malnutrition ?Flowsheet Row Most Recent Value  ?Orbital Region Mild depletion  ?Upper Arm Region No depletion  ?Thoracic and Lumbar  Region No depletion  ?Buccal Region No depletion  ?Temple Region No depletion  ?Clavicle Bone Region Mild depletion  ?Clavicle and Acromion Bone Region Mild depletion  ?Scapular Bone Region No depletion  ?Dorsal Hand No depletion  ?Patellar Region No depletion  ?Anterior Thigh Region No depletion  ?Posterior Calf Region No depletion  ?Edema (RD Assessment) None  ?Hair Reviewed  ?Eyes Reviewed  ?Mouth Reviewed  ?Skin Reviewed  ?Nails Reviewed  ? ?Diet Order:   ?Diet Order   ? ?       ?  Diet regular Room service appropriate? Yes; Fluid consistency: Thin  Diet effective now       ?  ? ?  ?  ? ?  ? ?EDUCATION NEEDS:  ?No education needs have been identified at this time ? ?Skin:  Skin Assessment: Reviewed RN Assessment ? ?Last BM:  3/30 ? ?Height:  ?Ht Readings from Last 1 Encounters:  ?08/08/21 '5\' 6"'$  (1.676 m)  ? ?Weight:  ?Wt Readings from Last 1 Encounters:  ?08/10/21 64.9 kg  ? ? ?Ideal Body Weight:  59.1 kg ? ?BMI:  Body mass index is 23.08 kg/m?. ? ?Estimated Nutritional Needs:  ?Kcal:  1500-1700 kcal/d ?Protein:  75-85 g/d ?Fluid:  1.5-1.8 L/d ? ? ?Ranell Patrick, RD, LDN ?Clinical Dietitian ?RD pager # available in [place]  ?After hours/weekend pager # available in [place] ?

## 2021-08-10 NOTE — Progress Notes (Signed)
Occupational Therapy Treatment ?Patient Details ?Name: Teresa Lin ?MRN: [id number] ?DOB: Nov 10, 1931 ?Today's Date: 08/10/2021 ? ? ?History of present illness Pt is an 88 y.o. female admitted 08/08/21 after fall sustaining open R transcondylar distal humerus fx. S/p R humeral ORIF, ulnar nerve neuroplasty 3/30. PMH includes HTN, IBS, DVT/PE, arthritis, anxiety, HOH, multiple joint replacements. ?  ?OT comments ? Min assist to ambulate to bathroom for toileting and grooming at sink. Educated pt and son in sling use for comfort and edema management techniques. Performed gentle R elbow AAROM and AROM hand and wrist. Max assist to doff front opening gown. Elevated R UE above heart and applied ice once pt returned to supine.  ? ?Recommendations for follow up therapy are one component of a multi-disciplinary discharge planning process, led by the attending physician.  Recommendations may be updated based on patient status, additional functional criteria and insurance authorization. ?   ?Follow Up Recommendations ? Acute inpatient rehab (3hours/day)  ?  ?Assistance Recommended at Discharge Frequent or constant Supervision/Assistance  ?Patient can return home with the following ? A little help with walking and/or transfers;A lot of help with bathing/dressing/bathroom;Assistance with cooking/housework;Direct supervision/assist for medications management;Assistance with feeding;Assist for transportation;Help with stairs or ramp for entrance ?  ?Equipment Recommendations ? None recommended by OT  ?  ?Recommendations for Other Services   ? ?  ?Precautions / Restrictions Precautions ?Precautions: Fall ?Type of Shoulder Precautions: "Gentle motion of R elbow ok just no extension against resistance, no pushing up " ?Shoulder Interventions: Shoulder sling/immobilizer;For comfort ?Required Braces or Orthoses: Sling ?Restrictions ?Weight Bearing Restrictions: Yes ?RUE Weight Bearing: Non weight bearing  ? ? ?  ? ?Mobility Bed  Mobility ?Overal bed mobility: Needs Assistance ?Bed Mobility: Sit to Supine ?  ?  ?Supine to sit: Supervision ?  ?  ?General bed mobility comments: supervision to avoid weight bearing on R elbow ?  ? ?Transfers ?Overall transfer level: Needs assistance ?Equipment used: 1 person hand held assist ?Transfers: Sit to/from Stand ?Sit to Stand: Min assist ?  ?  ?  ?  ?  ?General transfer comment: pt very tentative, fearful of falling, reaching for stability on IV pole ?  ?  ?Balance Overall balance assessment: Needs assistance ?  ?Sitting balance-Leahy Scale: Fair ?  ?  ?Standing balance support: Single extremity supported ?Standing balance-Leahy Scale: Poor ?Standing balance comment: static stands with min guard assist, hand held for ambulation ?  ?  ?  ?  ?  ?  ?  ?  ?  ?  ?  ?   ? ?ADL either performed or assessed with clinical judgement  ? ?ADL Overall ADL's : Needs assistance/impaired ?  ?  ?Grooming: Min guard;Standing;Wash/dry hands ?  ?  ?  ?  ?  ?Upper Body Dressing : Maximal assistance;Sitting ?Upper Body Dressing Details (indicate cue type and reason): to doff front opening gown ?  ?  ?Toilet Transfer: Minimal assistance;Ambulation;BSC/3in1 ?  ?Toileting- Clothing Manipulation and Hygiene: Modified independent;Sitting/lateral lean ?  ?  ?  ?Functional mobility during ADLs: Minimal assistance ?  ?  ? ?Extremity/Trunk Assessment Upper Extremity Assessment ?Upper Extremity Assessment: RUE deficits/detail;Generalized weakness ?RUE Deficits / Details: pt asking to come out of sling, tolerated well ?RUE Coordination: decreased fine motor;decreased gross motor ?  ?Lower Extremity Assessment ?Lower Extremity Assessment: Generalized weakness ?  ?  ?  ? ?Vision   ?  ?  ?Perception   ?  ?Praxis   ?  ? ?Cognition Arousal/Alertness:  Awake/alert ?Behavior During Therapy: Impulsive ?Overall Cognitive Status: Impaired/Different from baseline ?Area of Impairment: Attention, Safety/judgement, Memory ?  ?  ?  ?  ?  ?  ?  ?  ?   ?Current Attention Level: Selective ?Memory: Decreased short-term memory ?  ?Safety/Judgement: Decreased awareness of safety, Decreased awareness of deficits ?  ?  ?General Comments: needing repetition of edema management, sling for comfort and to call for assistance when walking ?  ?  ?   ?Exercises Exercises: General Upper Extremity ?General Exercises - Upper Extremity ?Elbow Flexion: AAROM, 5 reps, Right ?Elbow Extension: AAROM, Right, 5 reps ?Wrist Flexion: AROM, Right, 5 reps ?Wrist Extension: AROM, Left, 5 reps ?Digit Composite Flexion: AAROM, Right, 5 reps ?Composite Extension: AROM, Right, 5 reps ? ?  ?Shoulder Instructions   ? ? ?  ?General Comments Pt's son present and supportive. Initiated educ re: precautions, sling wear, edema control, importance of finger/wrist AROM  ? ? ?Pertinent Vitals/ Pain       Pain Assessment ?Pain Assessment: No/denies pain ?Faces Pain Scale: Hurts little more ?Pain Location: R UE ?Pain Descriptors / Indicators: Grimacing, Guarding, Discomfort ?Pain Intervention(s): Premedicated before session, Repositioned, Ice applied ? ?Home Living Family/patient expects to be discharged to:: Private residence ?Living Arrangements: Alone ?Available Help at Discharge: Family;Available PRN/intermittently ?Type of Home: House ?Home Access: Stairs to enter;Ramped entrance ?Entrance Stairs-Number of Steps: 1+1 ?Entrance Stairs-Rails: None ?Home Layout: Multi-level;Able to live on main level with bedroom/bathroom ?  ?  ?Bathroom Shower/Tub: Walk-in shower ?  ?Bathroom Toilet: Handicapped height ?  ?  ?Home Equipment: BSC/3in1;Cane - single point;Rolling Walker (2 wheels);Shower seat ?  ?  ?  ? ?  ?Prior Functioning/Environment    ?  ?  ?  ?   ? ?Frequency ? Min 3X/week  ? ? ? ? ?  ?Progress Toward Goals ? ?OT Goals(current goals can now be found in the care plan section) ? Progress towards OT goals: Progressing toward goals ? ?Acute Rehab OT Goals ?OT Goal Formulation: With patient ?Time For Goal  Achievement: 08/23/21 ?Potential to Achieve Goals: Good ?ADL Goals ?Pt Will Perform Eating: with modified independence;sitting ?Pt Will Perform Grooming: with supervision;standing ?Pt Will Perform Upper Body Dressing: with supervision;sitting ?Pt Will Perform Lower Body Dressing: with supervision;sit to/from stand ?Pt Will Transfer to Toilet: with supervision;ambulating ?Pt Will Perform Toileting - Clothing Manipulation and hygiene: with supervision;sit to/from stand ?Pt/caregiver will Perform Home Exercise Program: Increased ROM;Right Upper extremity;With Supervision (AROM R elbow to hand, no resistance elbow extension)  ?Plan Discharge plan remains appropriate   ? ?Co-evaluation ? ? ?   ?  ?  ?  ?  ? ?  ?AM-PAC OT "6 Clicks" Daily Activity     ?Outcome Measure ? ? Help from another person eating meals?: A Little ?Help from another person taking care of personal grooming?: A Little ?Help from another person toileting, which includes using toliet, bedpan, or urinal?: A Lot ?Help from another person bathing (including washing, rinsing, drying)?: A Lot ?Help from another person to put on and taking off regular upper body clothing?: A Lot ?Help from another person to put on and taking off regular lower body clothing?: A Lot ?6 Click Score: 14 ? ?  ?End of Session   ? ?OT Visit Diagnosis: Unsteadiness on feet (R26.81);Other abnormalities of gait and mobility (R26.89);Pain ?  ?Activity Tolerance Patient tolerated treatment well ?  ?Patient Left in bed;with call bell/phone within reach;with bed alarm set;with family/visitor present ?  ?Nurse  Communication   ?  ? ?   ? ?Time: 7494-4967 ?OT Time Calculation (min): 17 min ? ?Charges: OT General Charges ?$OT Visit: 1 Visit ?OT Treatments ?$Self Care/Home Management : 8-22 mins ? ?Nestor Lewandowsky, OTR/L ?Acute Rehabilitation Services ?Pager: [phone] ?Office: [phone]  ? ?Malka So ?08/10/2021, 2:52 PM ?

## 2021-08-10 NOTE — Progress Notes (Signed)
Mobility Specialist Progress Note  ? ? 08/10/21 1633  ?Mobility  ?Activity Refused mobility  ? ?Pt c/o pain saying the medicine isn't helping like it normally does. Will f/u as schedule permits.  ? ?Teresa Lin ?Mobility Specialist  ?  ?

## 2021-08-10 NOTE — Evaluation (Addendum)
Occupational Therapy Evaluation Patient Details Name: Teresa Lin MRN: [id number] DOB: 07-29-1931 Today's Date: 08/10/2021   History of Present Illness Pt is an 88 y.o. female admitted 08/08/21 after fall sustaining open R transcondylar distal humerus fx. S/p R humeral ORIF, ulnar nerve neuroplasty 3/30. PMH includes HTN, IBS, DVT/PE, arthritis, anxiety, HOH, multiple joint replacements.   Clinical Impression   Pt lives alone, drives and is active. She walks in her neighborhood regularly. Pt has a long hx of falls and fractures per son. Pt presents with R UE pain and impaired balance with LOB with turning requiring physical assist to recover. She needs min to max assist for ADLs. Began educating pt in R elbow to hand gentle ROM, sling use and compensatory strategies for UB dressing. Pt is an excellent rehab candidate, recommending intensive therapy in [facility] prior to return home. Son is arranging 24 hour care.       Recommendations for follow up therapy are one component of a multi-disciplinary discharge planning process, led by the attending physician.  Recommendations may be updated based on patient status, additional functional criteria and insurance authorization.   Follow Up Recommendations  Acute inpatient rehab (3hours/day)    Assistance Recommended at Discharge Frequent or constant Supervision/Assistance  Patient can return home with the following A little help with walking and/or transfers;A lot of help with bathing/dressing/bathroom;Assistance with cooking/housework;Direct supervision/assist for medications management;Assistance with feeding;Assist for transportation;Help with stairs or ramp for entrance    Functional Status Assessment  Patient has had a recent decline in their functional status and demonstrates the ability to make significant improvements in function in a reasonable and predictable amount of time.  Equipment Recommendations  None recommended by OT    Recommendations  for Other Services Rehab consult     Precautions / Restrictions Precautions Precautions: Fall;Shoulder Type of Shoulder Precautions: gentle elbow ROM ok, no elbow extension against resistance Shoulder Interventions: Shoulder sling/immobilizer for comfort Precaution Booklet Issued: No Required Braces or Orthoses: Sling Restrictions Weight Bearing Restrictions: Yes RUE Weight Bearing: Non weight bearing      Mobility Bed Mobility Overal bed mobility: Needs Assistance Bed Mobility: Supine to Sit     Supine to sit: Supervision     General bed mobility comments: increased time, instructed pt to get OOB toward L to avoid weight bearing on R UE    Transfers Overall transfer level: Needs assistance Equipment used: None Transfers: Sit to/from Stand Sit to Stand: Min guard           General transfer comment: pt very tentative, fearful of falling, reaching for stability on IV pole      Balance Overall balance assessment: Needs assistance   Sitting balance-Leahy Scale: Fair     Standing balance support: No upper extremity supported Standing balance-Leahy Scale: Poor Standing balance comment: pt with LOB when turning around, requiring assist to prevent fall                           ADL either performed or assessed with clinical judgement   ADL Overall ADL's : Needs assistance/impaired Eating/Feeding: Minimal assistance;Sitting   Grooming: Minimal assistance;Sitting   Upper Body Bathing: Maximal assistance;Sitting   Lower Body Bathing: Sit to/from stand;Maximal assistance   Upper Body Dressing : Maximal assistance;Sitting Upper Body Dressing Details (indicate cue type and reason): began educating pt in compensatory strategies for UB dressing and positioning R UE in sling Lower Body Dressing: Maximal assistance;Sit to/from stand  Toilet Transfer: Minimal assistance;Ambulation;BSC/3in1   Toileting- Architect and Hygiene: Sit to/from  stand;Moderate assistance       Functional mobility during ADLs: Minimal assistance       Vision Baseline Vision/History: 1 Wears glasses Ability to See in Adequate Light: 0 Adequate Patient Visual Report: No change from baseline Additional Comments: wears readers     Perception     Praxis      Pertinent Vitals/Pain Pain Assessment Pain Assessment: Faces Faces Pain Scale: Hurts little more Pain Location: R shoulder Pain Descriptors / Indicators: Grimacing, Guarding, Discomfort Pain Intervention(s): Monitored during session, Repositioned     Hand Dominance Right   Extremity/Trunk Assessment Upper Extremity Assessment Upper Extremity Assessment: RUE deficits/detail RUE Deficits / Details: shoulder ROM not assessed, performed gentle AAROM of elbow within pain tolerance and ace wrap, AROM wrist and hand RUE: Unable to fully assess due to immobilization RUE Coordination: decreased fine motor;decreased gross motor   Lower Extremity Assessment Lower Extremity Assessment: Defer to PT evaluation       Communication Communication Communication: HOH   Cognition Arousal/Alertness: Awake/alert Behavior During Therapy: Anxious Overall Cognitive Status: Impaired/Different from baseline Area of Impairment: Safety/judgement                         Safety/Judgement: Decreased awareness of safety, Decreased awareness of deficits     General Comments: pt is aware she has impaired balance, but has not been agreeable to using AD per son, pt with medical alert system and is faithful with using     General Comments       Exercises     Shoulder Instructions      Home Living Family/patient expects to be discharged to:: Private residence Living Arrangements: Alone Available Help at Discharge: Family;Available PRN/intermittently (son is working on getting 24 hour care) Type of Home: House Home Access: Stairs to enter;Ramped entrance Entrance Stairs-Number of Steps:  1+1 Entrance Stairs-Rails: None Home Layout: Multi-level;Able to live on main level with bedroom/bathroom     Bathroom Shower/Tub: Walk-in shower   Bathroom Toilet: Handicapped height     Home Equipment: BSC/3in1;Cane - single point;Rolling Walker (2 wheels);Shower seat          Prior Functioning/Environment Prior Level of Function : Independent/Modified Independent;Driving;History of Falls (last six months)             Mobility Comments: pt with h/o 11 falls, has been told to use AD, but does not currently          OT Problem List: Impaired balance (sitting and/or standing);Decreased coordination;Decreased safety awareness;Pain;Impaired UE functional use;Decreased knowledge of use of DME or AE      OT Treatment/Interventions: DME and/or AE instruction;Therapeutic exercise;Therapeutic activities;Patient/family education;Balance training    OT Goals(Current goals can be found in the care plan section) Acute Rehab OT Goals OT Goal Formulation: With patient Time For Goal Achievement: 08/23/21 Potential to Achieve Goals: Good ADL Goals Pt Will Perform Eating: with modified independence;sitting Pt Will Perform Grooming: with supervision;standing Pt Will Perform Upper Body Dressing: with supervision;sitting Pt Will Perform Lower Body Dressing: with supervision;sit to/from stand Pt Will Transfer to Toilet: with supervision;ambulating Pt Will Perform Toileting - Clothing Manipulation and hygiene: with supervision;sit to/from stand Pt/caregiver will Perform Home Exercise Program: Increased ROM;Right Upper extremity;With Supervision (AROM R elbow to hand, no resistance elbow extension)  OT Frequency: Min 3X/week    Co-evaluation  AM-PAC OT "6 Clicks" Daily Activity     Outcome Measure Help from another person eating meals?: A Little Help from another person taking care of personal grooming?: A Little Help from another person toileting, which includes using  toliet, bedpan, or urinal?: A Lot Help from another person bathing (including washing, rinsing, drying)?: A Lot Help from another person to put on and taking off regular upper body clothing?: A Lot Help from another person to put on and taking off regular lower body clothing?: A Lot 6 Click Score: 14   End of Session Equipment Utilized During Treatment: Gait belt Nurse Communication: Mobility status  Activity Tolerance: Patient tolerated treatment well Patient left: in chair;with call bell/phone within reach;with chair alarm set;with family/visitor present  OT Visit Diagnosis: Unsteadiness on feet (R26.81);Other abnormalities of gait and mobility (R26.89);Pain Pain - Right/Left: Right Pain - part of body: Shoulder                Time: 6160-7371 OT Time Calculation (min): 15 min Charges:  OT General Charges $OT Visit: 1 Visit OT Evaluation $OT Eval Moderate Complexity: 1 Mod  Martie Round, OTR/L Acute Rehabilitation Services Pager: [phone] Office: [phone]  Evern Bio 08/10/2021, 11:50 AM

## 2021-08-10 NOTE — Progress Notes (Signed)
? ?[facility] ?PROGRESS NOTE ? ? ?Teresa Lin:[id number] DOB: April 25, 1932 DOA: 08/08/2021  2 ?DOS: the patient was seen and examined on 08/10/2021 ? ?PCP: Adin Hector, MD ? ?Brief History and Hospital Course:  ?88 year old female with a history of rheumatoid arthritis, hypertension, history of DVT and PE on chronic Eliquis therapy presented to the ER after sustaining a fall at home.  This was a mechanical fall.  Evaluation at the [facility] ER revealed a compound fracture of the right humerus.  Due to lack of availability of the appropriate specialist in that hospital, patient was transferred here to [facility] for further management. Last dose of Eliquis was around 10 PM on 08/07/2021.  Patient underwent ORIF on 3/30. ? ?Consultants: Orthopedics ? ?Procedures:   ?1.  OPEN REDUCTION INTERNAL FIXATION OF RIGHT TRANSCONDYLAR HUMERUS FRACTURE. ?2.  ULNAR NERVE NEUROPLASTY. ?3.  ULNA OSTEOTOMY. ?4.  SURGICAL DEBRIDEMENT OF OPEN FRACTURE INCLUDING REMOVAL OF BONE ? ? ? ?Subjective: ?Patient eating breakfast this morning.  Some nausea overnight.  No vomiting.  No abdominal pain.  Denies any shortness of breath or chest pain.  Son is at the bedside. ? ? ?Assessment/Plan: ? ? ?* Open fracture of right humerus ?Result of a mechanical fall.   ?Patient underwent ORIF on 3/30.  Seems to be stable this morning.  Pain appears to be reasonably well controlled.  ?Resumption of Eliquis per orthopedic surgery.   ?PT and OT. ? ?History of pulmonary embolism ?On Eliquis prior to admission.  Held for surgery.  Resumption per orthopedics. ? ?Rheumatoid arthritis of multiple sites without rheumatoid factor ([place]) ?On methotrexate, plaquenil and Orencia as outpatient.   ?Plaquenil being continued.  Methotrexate is a weekly injection.  Orencia injection given once a month. ? ?Hypokalemia ?Potassium has improved.  Magnesium 1.9. ? ? ? ?DVT Prophylaxis: Per orthopedics.  Was on Eliquis prior to admission. ?Code Status: Full code ?Family  Communication: Discussed with the son ?Disposition Plan: To be determined.  Patient lives by herself.  Await PT and OT evaluation. ? ?Status is: Inpatient ?Remains inpatient appropriate because: Humeral fracture requiring surgery. ? ? ? ? ?Medications: Scheduled: ? acetaminophen  650 mg Oral Q6H  ? vitamin C  1,000 mg Oral Daily  ? calcium-vitamin D  1 tablet Oral Daily  ? donepezil  5 mg Oral QHS  ? folic acid  2 mg Oral Daily  ? hydroxychloroquine  200 mg Oral Daily  ? ?Continuous: ?  ceFAZolin (ANCEF) IV 200 mL/hr at 08/10/21 0800  ? lactated ringers 75 mL/hr at 08/10/21 0800  ? ?XAJ:OINOMVEHMCNOB **OR** acetaminophen, melatonin, naLOXone (NARCAN)  injection, ondansetron **OR** ondansetron (ZOFRAN) IV, oxyCODONE **OR** oxyCODONE ? ?Antibiotics: ?Anti-infectives (From admission, onward)  ? ? Start     Dose/Rate Route Frequency Ordered Stop  ? 08/09/21 2200  ceFAZolin (ANCEF) IVPB 2g/100 mL premix       ? 2 g ?200 mL/hr over 30 Minutes Intravenous Every 8 hours 08/09/21 1716 08/10/21 2159  ? 08/09/21 1000  hydroxychloroquine (PLAQUENIL) tablet 200 mg       ? 200 mg Oral Daily 08/08/21 2158    ? 08/08/21 2300  ceFAZolin (ANCEF) IVPB 2g/100 mL premix       ? 2 g ?200 mL/hr over 30 Minutes Intravenous Every 8 hours 08/08/21 2205 08/09/21 1635  ? ?  ? ? ?Objective: ? ?Vital Signs ? ?Vitals:  ? 08/09/21 1546 08/09/21 2109 08/10/21 0500 08/10/21 0700  ?BP: (!) 114/52 (!) 115/54  Marland Kitchen)  134/55  ?Pulse: 66 69  64  ?Resp: '17 18  17  '$ ?Temp: 97.7 ?F (36.5 ?C) 97.7 ?F (36.5 ?C)  98 ?F (36.7 ?C)  ?TempSrc: Oral Oral  Oral  ?SpO2: 98% 98%  98%  ?Weight:   33.2 kg   ?Height:      ? ? ?Intake/Output Summary (Last 24 hours) at 08/10/2021 0926 ?Last data filed at 08/10/2021 0800 ?Gross per 24 hour  ?Intake 4889.58 ml  ?Output 2100 ml  ?Net 2789.58 ml  ? ? ?Filed Weights  ? 08/08/21 2119 08/10/21 0500  ?Weight: 61.7 kg 33.2 kg  ? ? ?General appearance: Awake alert.  In no distress ?Resp: Clear to auscultation bilaterally.  Normal  effort ?Cardio: S1-S2 is normal regular.  No S3-S4.  No rubs murmurs or bruit ?GI: Abdomen is soft.  Nontender nondistended.  Bowel sounds are present normal.  No masses organomegaly ?Extremities: Right arm is in a splint ?Neurologic:   No focal neurological deficits.  ? ? ? ? ?Lab Results: ? ?Data Reviewed: I have personally reviewed labs and imaging study reports ? ?CBC: ?Recent Labs  ?Lab 08/08/21 ?1333 08/09/21 ?0135 08/10/21 ?0306  ?WBC 9.0 7.9 11.5*  ?NEUTROABS 7.7 6.2  --   ?HGB 13.3 12.3 11.3*  ?HCT 41.3 36.6 34.4*  ?MCV 96.9 94.8 95.8  ?PLT 292 281 255  ? ? ? ?Basic Metabolic Panel: ?Recent Labs  ?Lab 08/08/21 ?1333 08/09/21 ?0135 08/10/21 ?0306  ?NA 137 138 134*  ?K 3.8 3.3* 4.0  ?CL 100 103 101  ?CO2 '26 25 26  '$ ?GLUCOSE 143* 121* 106*  ?BUN '18 11 9  '$ ?CREATININE 0.63 0.55 0.53  ?CALCIUM 8.8* 8.4* 8.3*  ?MG  --  1.9  --   ? ? ? ?GFR: ?Estimated Creatinine Clearance: 26 mL/min (by C-G formula based on SCr of 0.53 mg/dL). ? ?Liver Function Tests: ?Recent Labs  ?Lab 08/09/21 ?0135  ?AST 29  ?ALT 16  ?ALKPHOS 41  ?BILITOT 0.6  ?PROT 6.1*  ?ALBUMIN 3.3*  ? ? ? ? ?Coagulation Profile: ?Recent Labs  ?Lab 08/08/21 ?1333  ?INR 1.1  ? ? ? ? ? ?Recent Results (from the past 240 hour(s))  ?Resp Panel by RT-PCR (Flu A&B, Covid) Nasopharyngeal Swab     Status: None  ? Collection Time: 08/08/21  1:33 PM  ? Specimen: Nasopharyngeal Swab; Nasopharyngeal(NP) swabs in vial transport medium  ?Result Value Ref Range Status  ? SARS Coronavirus 2 by RT PCR NEGATIVE NEGATIVE Final  ?  Comment: (NOTE) ?SARS-CoV-2 target nucleic acids are NOT DETECTED. ? ?The SARS-CoV-2 RNA is generally detectable in upper respiratory ?specimens during the acute phase of infection. The lowest ?concentration of SARS-CoV-2 viral copies this assay can detect is ?138 copies/mL. A negative result does not preclude SARS-Cov-2 ?infection and should not be used as the sole basis for treatment or ?other patient management decisions. A negative result may  occur with  ?improper specimen collection/handling, submission of specimen other ?than nasopharyngeal swab, presence of viral mutation(s) within the ?areas targeted by this assay, and inadequate number of viral ?copies(<138 copies/mL). A negative result must be combined with ?clinical observations, patient history, and epidemiological ?information. The expected result is Negative. ? ?Fact Sheet for Patients:  ?EntrepreneurPulse.com.au ? ?Fact Sheet for Healthcare Providers:  ?IncredibleEmployment.be ? ?This test is no t yet approved or cleared by the [place] FDA and  ?has been authorized for detection and/or diagnosis of SARS-CoV-2 by ?FDA under an Emergency Use Authorization (EUA). This EUA will remain  ?  in effect (meaning this test can be used) for the duration of the ?COVID-19 declaration under Section 564(b)(1) of the Act, 21 ?U.S.C.section 360bbb-3(b)(1), unless the authorization is terminated  ?or revoked sooner.  ? ? ?  ? Influenza A by PCR NEGATIVE NEGATIVE Final  ? Influenza B by PCR NEGATIVE NEGATIVE Final  ?  Comment: (NOTE) ?The Xpert Xpress SARS-CoV-2/FLU/RSV plus assay is intended as an aid ?in the diagnosis of influenza from Nasopharyngeal swab specimens and ?should not be used as a sole basis for treatment. Nasal washings and ?aspirates are unacceptable for Xpert Xpress SARS-CoV-2/FLU/RSV ?testing. ? ?Fact Sheet for Patients: ?EntrepreneurPulse.com.au ? ?Fact Sheet for Healthcare Providers: ?IncredibleEmployment.be ? ?This test is not yet approved or cleared by the [place] FDA and ?has been authorized for detection and/or diagnosis of SARS-CoV-2 by ?FDA under an Emergency Use Authorization (EUA). This EUA will remain ?in effect (meaning this test can be used) for the duration of the ?COVID-19 declaration under Section 564(b)(1) of the Act, 21 U.S.C. ?section 360bbb-3(b)(1), unless the authorization is terminated  or ?revoked. ? ?Performed at [facility], [place], ?[place] [address] ?  ?Surgical pcr screen     Status: None  ? Collection Time: 08/09/21 12:06 AM  ? Specimen: Nasal Mucosa; Nasal Swab  ?Re

## 2021-08-10 NOTE — Progress Notes (Signed)
Inpatient Rehab Admissions Coordinator:  ? ?Per therapy recommendations pt was screened for CIR by Shann Medal, PT, DPT.  Pt may be a candidate for CIR.  Will place a consult order for full assessment, but will also need to discuss whether pt meets criteria for medical necessity with rehab MD.   ? ?Shann Medal, PT, DPT ?Admissions Coordinator ?[phone] ?08/10/21  ?5:02 PM ? ?

## 2021-08-10 NOTE — Progress Notes (Signed)
? ?                              [facility] Progress Note ? ?Patient ID: ?Teresa Lin ?MRN: [id number] ?DOB/AGE: 10/07/1931 88 y.o. ? ?Subjective: ? ?Doing very well ?Sitting up in chair  ?Son at bedside ? Pt noted to have quite poor balance ? Frequent falls over the last year  ? ?Block wore off last night but pain controlled currently  ? ? ?ROS ?As above ? ?Objective:  ? ?VITALS:   ?Vitals:  ? 08/09/21 1546 08/09/21 2109 08/10/21 0500 08/10/21 0700  ?BP: (!) 114/52 (!) 115/54  (!) 134/55  ?Pulse: 66 69  64  ?Resp: '17 18  17  '$ ?Temp: 97.7 ?F (36.5 ?C) 97.7 ?F (36.5 ?C)  98 ?F (36.7 ?C)  ?TempSrc: Oral Oral  Oral  ?SpO2: 98% 98%  98%  ?Weight:   33.2 kg   ?Height:      ? ? ?Estimated body mass index is 11.81 kg/m? as calculated from the following: ?  Height as of this encounter: '5\' 6"'$  (1.676 m). ?  Weight as of this encounter: 33.2 kg. ? ? ?Intake/Output   ?   03/30 0701 ?03/31 0700 03/31 0701 ?04/01 0700  ? P.O. 155 240  ? I.V. (mL/kg) 4000 (120.5) 316.4 (9.5)  ? IV Piggyback 400 243.3  ? Total Intake(mL/kg) 4555 (137.2) 799.7 (24.1)  ? Urine (mL/kg/hr) 2000 (2.5)   ? Blood 100   ? Total Output 2100   ? Net +2455 +799.7  ?     ?  ? ?LABS ? ?Results for orders placed or performed during the hospital encounter of 08/08/21 (from the past 24 hour(s))  ?CBC     Status: Abnormal  ? Collection Time: 08/10/21  3:06 AM  ?Result Value Ref Range  ? WBC 11.5 (H) 4.0 - 10.5 K/uL  ? RBC 3.59 (L) 3.87 - 5.11 MIL/uL  ? Hemoglobin 11.3 (L) 12.0 - 15.0 g/dL  ? HCT 34.4 (L) 36.0 - 46.0 %  ? MCV 95.8 80.0 - 100.0 fL  ? MCH 31.5 26.0 - 34.0 pg  ? MCHC 32.8 30.0 - 36.0 g/dL  ? RDW 13.7 11.5 - 15.5 %  ? Platelets 255 150 - 400 K/uL  ? nRBC 0.0 0.0 - 0.2 %  ?Basic metabolic panel     Status: Abnormal  ? Collection Time: 08/10/21  3:06 AM  ?Result Value Ref Range  ? Sodium 134 (L) 135 - 145 mmol/L  ? Potassium 4.0 3.5 - 5.1 mmol/L  ? Chloride 101 98 - 111 mmol/L  ? CO2 26 22 - 32 mmol/L   ? Glucose, Bld 106 (H) 70 - 99 mg/dL  ? BUN 9 8 - 23 mg/dL  ? Creatinine, Ser 0.53 0.44 - 1.00 mg/dL  ? Calcium 8.3 (L) 8.9 - 10.3 mg/dL  ? GFR, Estimated >60 >60 mL/min  ? Anion gap 7 5 - 15  ? ? ? ?PHYSICAL EXAM:  ? ?Gen: sitting up in chair, NAD, pleasant  ?Ext:  ?     Right Upper Extremity  ? Dressing clean, dry and intact. Sling in place  ? Ext warm  ? Expected level of swelling  ? Radial, ulnar, median nerve motor and sensory functions intact ? AIN and PIN motor intact ? + radial pulse ? Good perfusion distally  ? Tolerates passive motion of digits and wrist  ? ?Assessment/Plan: ?1 Day  Post-Op  ? ? ? ?Anti-infectives (From admission, onward)  ? ? Start     Dose/Rate Route Frequency Ordered Stop  ? 08/09/21 2200  ceFAZolin (ANCEF) IVPB 2g/100 mL premix       ? 2 g ?200 mL/hr over 30 Minutes Intravenous Every 8 hours 08/09/21 1716 08/10/21 2159  ? 08/09/21 1000  hydroxychloroquine (PLAQUENIL) tablet 200 mg       ? 200 mg Oral Daily 08/08/21 2158    ? 08/08/21 2300  ceFAZolin (ANCEF) IVPB 2g/100 mL premix       ? 2 g ?200 mL/hr over 30 Minutes Intravenous Every 8 hours 08/08/21 2205 08/09/21 1635  ? ?  ?. ? ?POD/HD#: 1 ? ?88 y/o female s/p fall with open R distal humerus fracture s/p fall with open R distal humerus fracture  ? ?- ground level fall ? ?-fragility fracture R distal humerus, open R distal humerus fracture s/p ORIF  ? NWB R upper extremity  ? No pushing or pulling with R UEx ? Do not lift anything heavier than a cup of drink  ? Unrestricted ROM R elbow ? Ice and elevate ? PT/OT  ? ? Sling for comfort only, can be off when in chair or bed  ? ? Elevate hand above elbow and elbow above heart for swelling control  ? ? Dressing changes as needed starting on 08/12/2021.  Sutures out around 08/22/2021 ? ?- Pain management: ? Minimize narcotics ?  ?- ABL anemia/Hemodynamics ? Stable ? ?- Medical issues  ? Per primary  ? ?- DVT/PE prophylaxis: ? Ok to resume home eliquis  ?  I have place order  ? ?- ID:  ? Ancef for open fracture protocol  ? ?- Metabolic  Bone Disease: ? Vitamin d levels look great however this is by definition a fragility fracture which suggest osteoporosis  ? ?- Impediments to fracture healing: ? Open fracture ? Poor bone quality  ? ?- Dispo: ? Continue therapies ? CIR consult  ? ? ? ?Jari Pigg, PA-C ?[phone] (C) ?08/10/2021, 11:45 AM ? ?[facility] ?[place] [address] ?[phone] Jenetta Downer) ?[phone] (F) ? ? ? ?After 5pm and on the weekends please log on to Amion, go to orthopaedics and the look under the [facility] Call for the provider(s) on call. You can also call our office at [phone] and then follow the prompts to be connected to the call team.  ? Patient ID: Teresa Lin, female   DOB: [birth year], 88 y.o.   MRN: [id number] ? ?

## 2021-08-10 NOTE — Care Management Important Message (Signed)
Important Message ? ?Patient Details  ?Name: Teresa Lin ?MRN: [id number] ?Date of Birth: August 21, 1931 ? ? ?Medicare Important Message Given:  Yes ? ? ? ? ?Levada Dy  Adhvik Canady-Martin ?08/10/2021, 1:21 PM ?

## 2021-08-10 NOTE — Plan of Care (Signed)
?  Problem: Nutrition: Goal: Adequate nutrition will be maintained Outcome: Progressing   Problem: Coping: Goal: Level of anxiety will decrease Outcome: Progressing   Problem: Elimination: Goal: Will not experience complications related to bowel motility Outcome: Progressing   Problem: Pain Managment: Goal: General experience of comfort will improve Outcome: Progressing   

## 2021-08-10 NOTE — Evaluation (Addendum)
Physical Therapy Evaluation ?Patient Details ?Name: Teresa Lin ?MRN: [id number] ?DOB: February 27, 1932 ?Today's Date: 08/10/2021 ? ?History of Present Illness ? Pt is an 88 y.o. female admitted 08/08/21 after fall sustaining open R transcondylar distal humerus fx. S/p R humeral ORIF, ulnar nerve neuroplasty 3/30. PMH includes HTN, IBS, DVT/PE, arthritis, anxiety, HOH, multiple joint replacements. ?  ?Clinical Impression ? Pt presents with an overall decrease in functional mobility secondary to above. PTA, pt independent, active, lives alone with family nearby. Initiated educ re: precautions, positioning, edema control, therex, and importance of mobility. Today, pt able to initiate transfer and gait training, pt with multiple LOB requiring min-maxA to prevent falls; pt requiring assist for majority of ADLs due to dominant arm being NWB. Pt motivated to participate and regain PLOF. Pt is an excellent candidate for intensive CIR-level therapies to maximize functional mobility and independence prior to d/c home.     ? ?Recommendations for follow up therapy are one component of a multi-disciplinary discharge planning process, led by the attending physician.  Recommendations may be updated based on patient status, additional functional criteria and insurance authorization. ? ?Follow Up Recommendations Acute inpatient rehab (3hours/day) ? ?  ?Assistance Recommended at Discharge Frequent or constant Supervision/Assistance  ?Patient can return home with the following ? A little help with walking and/or transfers;A little help with bathing/dressing/bathroom;Assistance with cooking/housework;Assist for transportation;Help with stairs or ramp for entrance;Direct supervision/assist for medications management ? ?  ?Equipment Recommendations  (TBD)  ?Recommendations for Other Services ?    ?  ?Functional Status Assessment Patient has had a recent decline in their functional status and demonstrates the ability to make significant  improvements in function in a reasonable and predictable amount of time.  ? ?  ?Precautions / Restrictions Precautions ?Precautions: Fall;Shoulder ?Type of Shoulder Precautions: "Gentle motion of R elbow ok just no extension against resistance, no pushing up " ?Shoulder Interventions: Shoulder sling/immobilizer;For comfort ?Required Braces or Orthoses: Sling ?Restrictions ?Weight Bearing Restrictions: Yes ?RUE Weight Bearing: Non weight bearing  ?  ? ?Mobility ? Bed Mobility ?  ?  ?  ?  ?  ?  ?  ?  ?  ? ?Transfers ?Overall transfer level: Needs assistance ?Equipment used: None ?Transfers: Sit to/from Stand ?Sit to Stand: Min guard ?  ?  ?  ?  ?  ?General transfer comment: close min guard for balance, pt reaching out for IV pole to stabilize ?  ? ?Ambulation/Gait ?Ambulation/Gait assistance: Min guard, Min assist, Mod assist, Max assist ?Gait Distance (Feet): 120 Feet ?Assistive device: IV Pole, None, 1 person hand held assist ?Gait Pattern/deviations: Step-through pattern, Decreased stride length, Trunk flexed ?Gait velocity: Decreased ?  ?  ?General Gait Details: Slow, unsteady gait without DME, pt frequently attempting to reach out for LUE support, multiple LOB requiring min-maxA to prevent fall ? ?Stairs ?  ?  ?  ?  ?  ? ?Wheelchair Mobility ?  ? ?Modified Rankin (Stroke Patients Only) ?  ? ?  ? ?Balance Overall balance assessment: Needs assistance ?  ?Sitting balance-Leahy Scale: Fair ?  ?  ?Standing balance support: No upper extremity supported ?Standing balance-Leahy Scale: Fair ?Standing balance comment: can static stand without UE support ?Single Leg Stance - Right Leg: 0 ?Single Leg Stance - Left Leg: 0 ?Tandem Stance - Right Leg: 0 ?Tandem Stance - Left Leg: 0 ?[place]berg - Eyes Opened: 25 ?[place]homberg - Eyes Closed: 20 ?High level balance activites: Turns, Head turns, Direction changes ?High Level Balance Comments: Multiple  LOB with higher level balance tasks ?  ?  ?  ?   ? ? ? ?Pertinent Vitals/Pain Pain  Assessment ?Pain Assessment: Faces ?Faces Pain Scale: Hurts little more ?Pain Location: RUE ?Pain Descriptors / Indicators: Guarding, Sore ?Pain Intervention(s): Limited activity within patient's tolerance, Monitored during session, Repositioned  ? ? ?Home Living Family/patient expects to be discharged to:: Private residence ?Living Arrangements: Alone ?Available Help at Discharge: Family;Available PRN/intermittently ?Type of Home: House ?Home Access: Stairs to enter;Ramped entrance ?Entrance Stairs-Rails: None ?Entrance Stairs-Number of Steps: 1+1 ?  ?Home Layout: Multi-level;Able to live on main level with bedroom/bathroom ?Home Equipment: BSC/3in1;Cane - single point;Rolling Walker (2 wheels);Shower seat ?   ?  ?Prior Function Prior Level of Function : Independent/Modified Independent;Driving;History of Falls (last six months) ?  ?  ?  ?  ?  ?  ?Mobility Comments: pt with h/o 11 falls resulting in various fxs, has been told to use AD, but does not currently; wears life alert button ?  ?  ? ? ?Hand Dominance  ?   ? ?  ?Extremity/Trunk Assessment  ? Upper Extremity Assessment ?Upper Extremity Assessment: RUE deficits/detail;Generalized weakness ?RUE Deficits / Details: s/p R humerus fx in ace wrap; tolerated gentle elbow flex/ext AAROM; AROM wrist/fingers ?RUE Coordination: decreased fine motor;decreased gross motor ?  ? ?Lower Extremity Assessment ?Lower Extremity Assessment: Generalized weakness ?  ? ?   ?Communication  ? Communication: HOH  ?Cognition Arousal/Alertness: Awake/alert ?Behavior During Therapy: [facility] for tasks assessed/performed, Anxious ?Overall Cognitive Status: Impaired/Different from baseline ?Area of Impairment: Attention, Following commands, Safety/judgement, Awareness, Problem solving ?  ?  ?  ?  ?  ?  ?  ?  ?  ?Current Attention Level: Selective ?  ?Following Commands: Follows multi-step commands inconsistently ?Safety/Judgement: Decreased awareness of safety, Decreased awareness of  deficits ?Awareness: Emergent ?Problem Solving: Requires verbal cues ?  ?  ?  ? ?  ?General Comments General comments (skin integrity, edema, etc.): Pt's son present and supportive. Initiated educ re: precautions, sling wear, edema control, importance of finger/wrist AROM ? ?  ?Exercises    ? ?Assessment/Plan  ?  ?PT Assessment Patient needs continued PT services  ?PT Problem List Decreased strength;Decreased range of motion;Decreased activity tolerance;Decreased balance;Decreased mobility;Decreased cognition;Decreased knowledge of use of DME;Decreased safety awareness;Decreased knowledge of precautions;Pain ? ?   ?  ?PT Treatment Interventions DME instruction;Gait training;Stair training;Functional mobility training;Therapeutic activities;Therapeutic exercise;Balance training;Patient/family education   ? ?PT Goals (Current goals can be found in the Care Plan section)  ?Acute Rehab PT Goals ?Patient Stated Goal: Rehab at [facility] before return home ?PT Goal Formulation: With patient/family ?Time For Goal Achievement: 08/24/21 ?Potential to Achieve Goals: Good ? ?  ?Frequency Min 5X/week ?  ? ? ?Co-evaluation   ?  ?  ?  ?  ? ? ?  ?AM-PAC PT "6 Clicks" Mobility  ?Outcome Measure Help needed turning from your back to your side while in a flat bed without using bedrails?: A Little ?Help needed moving from lying on your back to sitting on the side of a flat bed without using bedrails?: A Little ?Help needed moving to and from a bed to a chair (including a wheelchair)?: A Lot ?Help needed standing up from a chair using your arms (e.g., wheelchair or bedside chair)?: A Little ?Help needed to walk in hospital room?: A Lot ?Help needed climbing 3-5 steps with a railing? : A Lot ?6 Click Score: 15 ? ?  ?End of Session Equipment Utilized During  Treatment: Gait belt ?Activity Tolerance: Patient tolerated treatment well ?Patient left: in chair;with call bell/phone within reach;with chair alarm set ?Nurse Communication: Mobility  status ?PT Visit Diagnosis: Other abnormalities of gait and mobility (R26.89);Muscle weakness (generalized) (M62.81);Pain ?  ? ?Time: 0165-5374 ?PT Time Calculation (min) (ACUTE ONLY): 17 min ? ? ?Charges:   PT Evaluation ?$PT Ev

## 2021-08-11 DIAGNOSIS — Z86711 Personal history of pulmonary embolism: Secondary | ICD-10-CM | POA: Diagnosis not present

## 2021-08-11 DIAGNOSIS — M0609 Rheumatoid arthritis without rheumatoid factor, multiple sites: Secondary | ICD-10-CM | POA: Diagnosis not present

## 2021-08-11 DIAGNOSIS — E876 Hypokalemia: Secondary | ICD-10-CM | POA: Diagnosis not present

## 2021-08-11 DIAGNOSIS — D649 Anemia, unspecified: Secondary | ICD-10-CM

## 2021-08-11 DIAGNOSIS — S42491B Other displaced fracture of lower end of right humerus, initial encounter for open fracture: Secondary | ICD-10-CM | POA: Diagnosis not present

## 2021-08-11 LAB — CBC
HCT: 30.6 % — ABNORMAL LOW (ref 36.0–46.0)
Hemoglobin: 10.1 g/dL — ABNORMAL LOW (ref 12.0–15.0)
MCH: 31.7 pg (ref 26.0–34.0)
MCHC: 33 g/dL (ref 30.0–36.0)
MCV: 95.9 fL (ref 80.0–100.0)
Platelets: 229 10*3/uL (ref 150–400)
RBC: 3.19 MIL/uL — ABNORMAL LOW (ref 3.87–5.11)
RDW: 13.7 % (ref 11.5–15.5)
WBC: 8.5 10*3/uL (ref 4.0–10.5)
nRBC: 0 % (ref 0.0–0.2)

## 2021-08-11 LAB — BASIC METABOLIC PANEL
Anion gap: 6 (ref 5–15)
BUN: 7 mg/dL — ABNORMAL LOW (ref 8–23)
CO2: 27 mmol/L (ref 22–32)
Calcium: 7.9 mg/dL — ABNORMAL LOW (ref 8.9–10.3)
Chloride: 101 mmol/L (ref 98–111)
Creatinine, Ser: 0.43 mg/dL — ABNORMAL LOW (ref 0.44–1.00)
GFR, Estimated: >60 mL/min (ref >60)
Glucose, Bld: 101 mg/dL — ABNORMAL HIGH (ref 70–99)
Potassium: 3.6 mmol/L (ref 3.5–5.1)
Sodium: 134 mmol/L — ABNORMAL LOW (ref 135–145)

## 2021-08-11 MED ORDER — POTASSIUM CHLORIDE CRYS ER 20 MEQ PO TBCR
40.0000 meq | EXTENDED_RELEASE_TABLET | Freq: Once | ORAL | Status: AC
Start: 2021-08-11 — End: 2021-08-11
  Administered 2021-08-11: 40 meq via ORAL
  Filled 2021-08-11: qty 2

## 2021-08-11 NOTE — Progress Notes (Signed)
Mobility Specialist Progress Note: ? ? 08/11/21 0926  ?Mobility  ?Activity Ambulated with assistance in hallway  ?Level of Assistance Contact guard assist, steadying assist  ?Assistive Device  ?(HHA)  ?RUE Weight Bearing NWB  ?Distance Ambulated (ft) 150 ft  ?Activity Response Tolerated well  ?$Mobility charge 1 Mobility  ? ?Pt received in chair willing to participate in mobility. Complaints of 4/10 pain in R arm. Left in chair with call bell in reach and all needs met.  ? ?Teresa Lin ?Mobility Specialist ?Primary Phone [phone] ? ?

## 2021-08-11 NOTE — TOC Initial Note (Signed)
Transition of Care (TOC) - Initial/Assessment Note  ? ? ?Patient Details  ?Name: Teresa Lin ?MRN: [id number] ?Date of Birth: Sep 04, 1931 ? ?Transition of Care (TOC) CM/SW Contact:    ?Bartholomew Crews, RN ?Phone Number: [phone] ?08/11/2021, 4:19 PM ? ?Clinical Narrative:                 ? ?Spoke with patient and son, [name], at the bedside earlier this afternoon to discuss post acute transition home. At this time patient and [name] were not aware of patient lacking medical necessity for inpatient rehab. [name] requested to discuss situation with CIR liaison or MD. NCM communicated this to CIR liaison for follow up. NCM provided contact information to [facility] for follow up on dc plan.  ? ?Following follow up this afternoon, [name] called NCM to discuss post acute transition. [name] is aware of contacts for both PT/OT and caregiver resources. Discussed potential HH agencies for PT/OT per Medicare.gov website including discussion of quality and patient rating.  ? ?For DME patient already has a raised toilet and a walker. Discussed need for platform walker attachment for current walker which would reduce pressure to fractured arm when ambulating. Call to [employer] to identify if platform attachments are in stock - pending following call.  ? ?Discussed patient situation with attending. Anticipate readiness for transition on Monday.  ? ?TOC following for transition needs.  ? ?Expected Discharge Plan: [place] ?Barriers to Discharge: Continued Medical Work up ? ? ?Patient Goals and CMS Choice ?Patient states their goals for this hospitalization and ongoing recovery are:: return home with family/caregiver support ?CMS Medicare.gov Compare Post Acute Care list provided to:: Patient ?Choice offered to / list presented to : Adult Children ? ?Expected Discharge Plan and Services ?Expected Discharge Plan: [place] ?In-house Referral: NA ?Discharge Planning Services: CM Consult ?Post Acute Care Choice: Home  Health ?Living arrangements for the past 2 months: [place] ?                ?DME Arranged: Walker platform ?DME Agency: [employer] ?Date DME Agency Contacted: 08/11/21 ?Time DME Agency Contacted: [phone] ?Representative spoke with at DME Agency: Delana Meyer ?HH Arranged: PT, OT ?  ?  ?  ?  ? ?Prior Living Arrangements/Services ?Living arrangements for the past 2 months: [place] ?Lives with:: Self ?Patient language and need for interpreter reviewed:: Yes ?       ?Need for Family Participation in Patient Care: Yes (Comment) ?Care giver support system in place?: Yes (comment) ?Current home services: DME (walker) ?Criminal Activity/Legal Involvement Pertinent to Current Situation/Hospitalization: No - Comment as needed ? ?Activities of Daily Living ?Home Assistive Devices/Equipment: None ?ADL Screening (condition at time of admission) ?Patient's cognitive ability adequate to safely complete daily activities?: Yes ?Is the patient deaf or have difficulty hearing?: Yes ?Does the patient have difficulty seeing, even when wearing glasses/contacts?: No ?Does the patient have difficulty concentrating, remembering, or making decisions?: No ?Patient able to express need for assistance with ADLs?: Yes ?Does the patient have difficulty dressing or bathing?: No ?Independently performs ADLs?: Yes (appropriate for developmental age) ?Does the patient have difficulty walking or climbing stairs?: Yes ?Weakness of Legs: Both ?Weakness of Arms/Hands: Both ? ?Permission Sought/Granted ?Permission sought to share information with : Family Supports ?Permission granted to share information with : Yes, Verbal Permission Granted ? Share Information with NAME: Kenndra Morris ?   ? Permission granted to share info w Relationship: son ? Permission  granted to share info w Contact Information: [phone] ? ?Emotional Assessment ?Appearance:: Appears stated age ?Attitude/Demeanor/Rapport: Engaged ?Affect (typically observed):  Accepting ?Orientation: : Oriented to Self, Oriented to Place, Oriented to  Time, Oriented to Situation ?Alcohol / Substance Use: Not Applicable ?Psych Involvement: No (comment) ? ?Admission diagnosis:  Open fracture of right humerus [S42.301B] ?Patient Active Problem List  ? Diagnosis Date Noted  ? Normocytic anemia 08/11/2021  ? Hypokalemia 08/09/2021  ? Displaced comminuted supracondylar fracture of right humerus without intercondylar fracture 08/08/2021  ? Open fracture of right humerus 08/08/2021  ? History of DVT (deep vein thrombosis) 06/06/2021  ? Rheumatoid arthritis of multiple sites without rheumatoid factor ([place]) 04/24/2021  ? History of pulmonary embolism 02/04/2020  ? History of blood clotting disorder 01/12/2020  ? Immunosuppression ([place]) 01/25/2019  ? Status post reverse total shoulder replacement, right 09/24/2018  ? Hyperglycemia 08/31/2018  ? S/P right colectomy 01/26/2018  ? Adenomatous polyp of ascending colon 11/24/2017  ? Generalized anxiety disorder 09/06/2017  ? Irritable bowel syndrome (IBS) 09/06/2017  ? Orthostasis 06/05/2016  ? Left shoulder pain 10/13/2015  ? Constipation 05/30/2015  ? Osteoporosis, postmenopausal 05/30/2015  ? Compression fracture of L2 lumbar vertebra with delayed healing 05/29/2015  ? Frequent falls 03/26/2015  ? Atherosclerosis of arteries 03/26/2015  ? Family history of colon cancer 09/28/2014  ? TMJ (temporomandibular joint syndrome) 06/12/2014  ? Varicose veins of both lower extremities without ulcer or inflammation 06/12/2014  ? B12 deficiency 09/20/2013  ? Fatigue 05/23/2013  ? Internal hemorrhoids without complication 29/56/2130  ? Insomnia 03/03/2013  ? Vertigo due to cerebrovascular disease 03/03/2013  ? Seronegative rheumatoid arthritis affecting lower leg ([place]) 03/03/2013  ? ?PCP:  Adin Hector, MD ?Pharmacy:   ?[facility] - [place], [place] - [address] ?[address] ?[place] [address] ?Phone: [phone] Fax:  [phone] ? ?[id number]?Phone: [phone]: [phone]? ? ? ? ?Social Determinants of Health (SDOH) Interventions ?  ? ?Readmission Risk Interventions ?   ? View : No data to display.  ?  ?  ?  ? ? ? ?

## 2021-08-11 NOTE — Progress Notes (Signed)
Inpatient Rehab Admissions Coordinator:  ?Consult received. Physiatrist reviewed and determined there is not medical necessity to warrant a CIR admission. TOC made aware. AC will sign off.  ? ?Gayland Curry, MS, CCC-SLP ?Admissions Coordinator ?[phone] ? ?

## 2021-08-11 NOTE — Progress Notes (Signed)
? ?[facility] ?PROGRESS NOTE ? ? ?Teresa Lin JJO:[id number] DOB: 1931/12/21 DOA: 08/08/2021  3 ?DOS: the patient was seen and examined on 08/11/2021 ? ?PCP: Adin Hector, MD ? ?Brief History and Hospital Course:  ?88 year old female with a history of rheumatoid arthritis, hypertension, history of DVT and PE on chronic Eliquis therapy presented to the ER after sustaining a fall at home.  This was a mechanical fall.  Evaluation at the [facility] ER revealed a compound fracture of the right humerus.  Due to lack of availability of the appropriate specialist in that hospital, patient was transferred here to [facility] for further management. Last dose of Eliquis was around 10 PM on 08/07/2021.  Patient underwent ORIF on 3/30. ? ?Consultants: Orthopedics ? ?Procedures:   ?1.  OPEN REDUCTION INTERNAL FIXATION OF RIGHT TRANSCONDYLAR HUMERUS FRACTURE. ?2.  ULNAR NERVE NEUROPLASTY. ?3.  ULNA OSTEOTOMY. ?4.  SURGICAL DEBRIDEMENT OF OPEN FRACTURE INCLUDING REMOVAL OF BONE ? ? ? ?Subjective: ?Patient denies any complaints this morning.  Slept reasonably well last night.  Son is at the bedside. ? ? ?Assessment/Plan: ? ? ?* Open fracture of right humerus ?Result of a mechanical fall.   ?Patient underwent ORIF on 3/30.   ?Seen by PT and OT.  Inpatient rehabilitation is recommended.  Eliquis has been resumed by orthopedic service.   ?Pain is reasonably well controlled. ? ?History of pulmonary embolism ?On Eliquis prior to admission.  Held for surgery.  Resumed yesterday. ? ?Rheumatoid arthritis of multiple sites without rheumatoid factor ([place]) ?On methotrexate, plaquenil and Orencia as outpatient.   ?Plaquenil being continued.  Methotrexate is a weekly injection.  Orencia injection given once a month. ? ?Normocytic anemia ?Drop in hemoglobin is noted.  Likely due to combination of fracture as well as surgery and dilutional drop.  Continue to monitor.  No overt bleeding noted. ? ?Hypokalemia ?Supplement potassium. ? ? ? ?DVT  Prophylaxis: Eliquis ?Code Status: Full code ?Family Communication: Discussed with the son ?Disposition Plan: To be determined.  Patient lives by herself.  PT and OT recommended inpatient rehabilitation.  Await their input. ? ?Status is: Inpatient ?Remains inpatient appropriate because: Humeral fracture requiring surgery. ? ? ? ? ?Medications: Scheduled: ? acetaminophen  1,000 mg Oral Q8H  ? apixaban  5 mg Oral BID  ? vitamin C  1,000 mg Oral Daily  ? calcium-vitamin D  1 tablet Oral Daily  ? donepezil  5 mg Oral QHS  ? feeding supplement  237 mL Oral BID BM  ? folic acid  2 mg Oral Daily  ? hydroxychloroquine  200 mg Oral Daily  ? multivitamin with minerals  1 tablet Oral Daily  ? ?Continuous: ? lactated ringers Stopped (08/10/21 1121)  ? ?[id number] **OR** acetaminophen, melatonin, morphine injection, naLOXone (NARCAN)  injection, ondansetron **OR** ondansetron (ZOFRAN) IV, oxyCODONE **OR** oxyCODONE ? ?Antibiotics: ?Anti-infectives (From admission, onward)  ? ? Start     Dose/Rate Route Frequency Ordered Stop  ? 08/09/21 2200  ceFAZolin (ANCEF) IVPB 2g/100 mL premix       ? 2 g ?200 mL/hr over 30 Minutes Intravenous Every 8 hours 08/09/21 1716 08/10/21 1412  ? 08/09/21 1000  hydroxychloroquine (PLAQUENIL) tablet 200 mg       ? 200 mg Oral Daily 08/08/21 2158    ? 08/08/21 2300  ceFAZolin (ANCEF) IVPB 2g/100 mL premix       ? 2 g ?200 mL/hr over 30 Minutes Intravenous Every 8 hours 08/08/21 2205 08/09/21 1635  ? ?  ? ? ?  Objective: ? ?Vital Signs ? ?Vitals:  ? 08/10/21 1400 08/10/21 2034 08/11/21 0708 08/11/21 0746  ?BP:  (!) 122/52  (!) 126/53  ?Pulse:  71 78 65  ?Resp:  '16 16 17  '$ ?Temp:  98.2 ?F (36.8 ?C) 98.3 ?F (36.8 ?C) 98.2 ?F (36.8 ?C)  ?TempSrc:  Oral Oral Oral  ?SpO2:  100% 98% 98%  ?Weight: 64.9 kg     ?Height:      ? ? ?Intake/Output Summary (Last 24 hours) at 08/11/2021 1103 ?Last data filed at 08/10/2021 1800 ?Gross per 24 hour  ?Intake 1087 ml  ?Output --  ?Net 1087 ml  ? ? ?Filed Weights  ?  08/08/21 2119 08/10/21 0500 08/10/21 1400  ?Weight: 61.7 kg 33.2 kg 64.9 kg  ? ? ?General appearance: Awake alert.  In no distress ?Resp: Clear to auscultation bilaterally.  Normal effort ?Cardio: S1-S2 is normal regular.  No S3-S4.  No rubs murmurs or bruit ?GI: Abdomen is soft.  Nontender nondistended.  Bowel sounds are present normal.  No masses organomegaly ?Extremities: Right arm is in a splint ?Neurologic:  No focal neurological deficits.  ? ? ? ? ? ?Lab Results: ? ?Data Reviewed: I have personally reviewed labs and imaging study reports ? ?CBC: ?Recent Labs  ?Lab 08/08/21 ?1333 08/09/21 ?0135 08/10/21 ?0306 08/11/21 ?0232  ?WBC 9.0 7.9 11.5* 8.5  ?NEUTROABS 7.7 6.2  --   --   ?HGB 13.3 12.3 11.3* 10.1*  ?HCT 41.3 36.6 34.4* 30.6*  ?MCV 96.9 94.8 95.8 95.9  ?PLT 292 281 255 229  ? ? ? ?Basic Metabolic Panel: ?Recent Labs  ?Lab 08/08/21 ?1333 08/09/21 ?0135 08/10/21 ?0306 08/11/21 ?0232  ?NA 137 138 134* 134*  ?K 3.8 3.3* 4.0 3.6  ?CL 100 103 101 101  ?CO2 '26 25 26 27  '$ ?GLUCOSE 143* 121* 106* 101*  ?BUN '18 11 9 '$ 7*  ?CREATININE 0.63 0.55 0.53 0.43*  ?CALCIUM 8.8* 8.4* 8.3* 7.9*  ?MG  --  1.9  --   --   ? ? ? ?GFR: ?Estimated Creatinine Clearance: 46.4 mL/min (A) (by C-G formula based on SCr of 0.43 mg/dL (L)). ? ?Liver Function Tests: ?Recent Labs  ?Lab 08/09/21 ?0135  ?AST 29  ?ALT 16  ?ALKPHOS 41  ?BILITOT 0.6  ?PROT 6.1*  ?ALBUMIN 3.3*  ? ? ? ? ?Coagulation Profile: ?Recent Labs  ?Lab 08/08/21 ?1333  ?INR 1.1  ? ? ? ? ?Recent Results (from the past 240 hour(s))  ?Resp Panel by RT-PCR (Flu A&B, Covid) Nasopharyngeal Swab     Status: None  ? Collection Time: 08/08/21  1:33 PM  ? Specimen: Nasopharyngeal Swab; Nasopharyngeal(NP) swabs in vial transport medium  ?Result Value Ref Range Status  ? SARS Coronavirus 2 by RT PCR NEGATIVE NEGATIVE Final  ?  Comment: (NOTE) ?SARS-CoV-2 target nucleic acids are NOT DETECTED. ? ?The SARS-CoV-2 RNA is generally detectable in upper respiratory ?specimens during the acute  phase of infection. The lowest ?concentration of SARS-CoV-2 viral copies this assay can detect is ?138 copies/mL. A negative result does not preclude SARS-Cov-2 ?infection and should not be used as the sole basis for treatment or ?other patient management decisions. A negative result may occur with  ?improper specimen collection/handling, submission of specimen other ?than nasopharyngeal swab, presence of viral mutation(s) within the ?areas targeted by this assay, and inadequate number of viral ?copies(<138 copies/mL). A negative result must be combined with ?clinical observations, patient history, and epidemiological ?information. The expected result is Negative. ? ?Fact Sheet  for Patients:  ?EntrepreneurPulse.com.au ? ?Fact Sheet for Healthcare Providers:  ?IncredibleEmployment.be ? ?This test is no t yet approved or cleared by the [place] FDA and  ?has been authorized for detection and/or diagnosis of SARS-CoV-2 by ?FDA under an Emergency Use Authorization (EUA). This EUA will remain  ?in effect (meaning this test can be used) for the duration of the ?COVID-19 declaration under Section 564(b)(1) of the Act, 21 ?U.S.C.section 360bbb-3(b)(1), unless the authorization is terminated  ?or revoked sooner.  ? ? ?  ? Influenza A by PCR NEGATIVE NEGATIVE Final  ? Influenza B by PCR NEGATIVE NEGATIVE Final  ?  Comment: (NOTE) ?The Xpert Xpress SARS-CoV-2/FLU/RSV plus assay is intended as an aid ?in the diagnosis of influenza from Nasopharyngeal swab specimens and ?should not be used as a sole basis for treatment. Nasal washings and ?aspirates are unacceptable for Xpert Xpress SARS-CoV-2/FLU/RSV ?testing. ? ?Fact Sheet for Patients: ?EntrepreneurPulse.com.au ? ?Fact Sheet for Healthcare Providers: ?IncredibleEmployment.be ? ?This test is not yet approved or cleared by the [place] FDA and ?has been authorized for detection and/or diagnosis of  SARS-CoV-2 by ?FDA under an Emergency Use Authorization (EUA). This EUA will remain ?in effect (meaning this test can be used) for the duration of the ?COVID-19 declaration under Section 564(b)(1) of the Act, 21 U

## 2021-08-11 NOTE — Progress Notes (Addendum)
? ?                              Orthopaedic Trauma Service Progress Note ? ?Patient ID: ?SHIORI ADCOX ?MRN: [id number] ?DOB/AGE: [birth year] 88 y.o. ? ?Subjective: ? ?Pain much better today  ?No other complaints ? ?No lightheadedness or dizziness ?No SOB or CP ? ?ROS ?As above ? ?Objective:  ? ?VITALS:   ?Vitals:  ? 08/10/21 1400 08/10/21 2034 08/11/21 0708 08/11/21 0746  ?BP:  (!) 122/52  (!) 126/53  ?Pulse:  71 78 65  ?Resp:  '16 16 17  '$ ?Temp:  98.2 ?F (36.8 ?C) 98.3 ?F (36.8 ?C) 98.2 ?F (36.8 ?C)  ?TempSrc:  Oral Oral Oral  ?SpO2:  100% 98% 98%  ?Weight: 64.9 kg     ?Height:      ? ? ?Estimated body mass index is 23.08 kg/m? as calculated from the following: ?  Height as of this encounter: '5\' 6"'$  (1.676 m). ?  Weight as of this encounter: 64.9 kg. ? ? ?Intake/Output   ?   03/31 0701 ?04/01 0700 04/01 0701 ?04/02 0700  ? P.O. 1200   ? I.V. (mL/kg) 343.4 (5.3)   ? IV Piggyback 343.3   ? Total Intake(mL/kg) 1886.7 (29.1)   ? Urine (mL/kg/hr)    ? Blood    ? Total Output    ? Net +1886.7   ?     ? Urine Occurrence 6 x 1 x  ? Stool Occurrence 1 x   ?  ? ?LABS ? ?Results for orders placed or performed during the hospital encounter of 08/08/21 (from the past 24 hour(s))  ?CBC     Status: Abnormal  ? Collection Time: 08/11/21  2:32 AM  ?Result Value Ref Range  ? WBC 8.5 4.0 - 10.5 K/uL  ? RBC 3.19 (L) 3.87 - 5.11 MIL/uL  ? Hemoglobin 10.1 (L) 12.0 - 15.0 g/dL  ? HCT 30.6 (L) 36.0 - 46.0 %  ? MCV 95.9 80.0 - 100.0 fL  ? MCH 31.7 26.0 - 34.0 pg  ? MCHC 33.0 30.0 - 36.0 g/dL  ? RDW 13.7 11.5 - 15.5 %  ? Platelets 229 150 - 400 K/uL  ? nRBC 0.0 0.0 - 0.2 %  ?Basic metabolic panel     Status: Abnormal  ? Collection Time: 08/11/21  2:32 AM  ?Result Value Ref Range  ? Sodium 134 (L) 135 - 145 mmol/L  ? Potassium 3.6 3.5 - 5.1 mmol/L  ? Chloride 101 98 - 111 mmol/L  ? CO2 27 22 - 32 mmol/L  ? Glucose, Bld 101 (H) 70 - 99 mg/dL  ? BUN 7 (L) 8 - 23 mg/dL  ? Creatinine, Ser 0.43 (L) 0.44 - 1.00  mg/dL  ? Calcium 7.9 (L) 8.9 - 10.3 mg/dL  ? GFR, Estimated >60 >60 mL/min  ? Anion gap 6 5 - 15  ? ? ? ?PHYSICAL EXAM:  ? ?  ?Gen: sitting up in bed, NAD, pleasant  ?Ext:  ?     Right Upper Extremity  ?            Dressing clean, dry and intact. Hand elevated on pillow ?            Ext warm  ?            Expected level of swelling  ?            Radial,  ulnar, median nerve motor and sensory functions intact ?            AIN and PIN motor intact ?            + radial pulse ?            Good perfusion distally  ?            Tolerates passive motion of digits and wrist  ? Overall no changes in exam  ?  ? ?Assessment/Plan: ?2 Days Post-Op  ? ?Principal Problem: ?  Open fracture of right humerus ?Active Problems: ?  History of pulmonary embolism ?  Rheumatoid arthritis of multiple sites without rheumatoid factor ([place]) ?  Immunosuppression ([place]) ?  Hypokalemia ?  Normocytic anemia ? ? ?Anti-infectives (From admission, onward)  ? ? Start     Dose/Rate Route Frequency Ordered Stop  ? 08/09/21 2200  ceFAZolin (ANCEF) IVPB 2g/100 mL premix       ? 2 g ?200 mL/hr over 30 Minutes Intravenous Every 8 hours 08/09/21 1716 08/10/21 1412  ? 08/09/21 1000  hydroxychloroquine (PLAQUENIL) tablet 200 mg       ? 200 mg Oral Daily 08/08/21 2158    ? 08/08/21 2300  ceFAZolin (ANCEF) IVPB 2g/100 mL premix       ? 2 g ?200 mL/hr over 30 Minutes Intravenous Every 8 hours 08/08/21 2205 08/09/21 1635  ? ?  ?. ? ?POD/HD#: 2 ? ?88 y/o female s/p fall with open R distal humerus fracture  ?  ?- ground level fall ?  ?-fragility fracture R distal humerus, open R distal humerus fracture s/p ORIF  ?            NWB R upper extremity  ?            No pushing or pulling with R UEx ?            Do not lift anything heavier than a cup of drink  ?            Unrestricted ROM R elbow ?            Ice and elevate ?            PT/OT  ?  ?            Sling for comfort only, can be off when in chair or bed  ?  ?            Elevate hand above elbow and elbow above  heart for swelling control  ?  ?            Dressing changes as needed starting on 08/12/2021.  Sutures out around 08/22/2021 ? ?- Gait disturbance ? Poor balance for many years  ? Needs walker  ?  ?- Pain management: ?            Minimize narcotics ? Adjusted meds yesterday with good effect  ? I think she was behind due to her block wearing off. Now we appear to be caught up ?             ?- ABL anemia/Hemodynamics ?        Stable ? Monitor  ?  ?- Medical issues  ?            Per primary  ?  ?- DVT/PE prophylaxis: ?            Ok to resume home eliquis  ?  I have place order  ?  ?- ID:  ?            Ancef for open fracture protocol  ?  ?- Metabolic Bone Disease: ?            Vitamin d levels look great however this is by definition a fragility fracture which suggest osteoporosis  ?  ?- Impediments to fracture healing: ?            Open fracture ?            Poor bone quality  ?  ?- Dispo: ?            Continue therapies ?            CIR consult  ? ? ?Jari Pigg, PA-C ?[phone] (C) ?08/11/2021, 11:35 AM ? ?[facility] ?[place] [address] ?[phone] Jenetta Downer) ?[phone] (F) ? ? ? ?After 5pm and on the weekends please log on to Amion, go to orthopaedics and the look under the [facility] Call for the provider(s) on call. You can also call our office at [phone] and then follow the prompts to be connected to the call team.  ? Patient ID: LUCERITO ROSINSKI, female   DOB: 1931-07-15, 88 y.o.   MRN: [id number] ? ?

## 2021-08-11 NOTE — Assessment & Plan Note (Addendum)
Drop in hemoglobin is noted.  Likely due to combination of fracture as well as surgery and dilutional drop.  Stable for the most part.  Did not require transfusions ?

## 2021-08-12 DIAGNOSIS — S42491B Other displaced fracture of lower end of right humerus, initial encounter for open fracture: Secondary | ICD-10-CM | POA: Diagnosis not present

## 2021-08-12 LAB — CBC
HCT: 33.7 % — ABNORMAL LOW (ref 36.0–46.0)
Hemoglobin: 11.4 g/dL — ABNORMAL LOW (ref 12.0–15.0)
MCH: 32.4 pg (ref 26.0–34.0)
MCHC: 33.8 g/dL (ref 30.0–36.0)
MCV: 95.7 fL (ref 80.0–100.0)
Platelets: 262 10*3/uL (ref 150–400)
RBC: 3.52 MIL/uL — ABNORMAL LOW (ref 3.87–5.11)
RDW: 13.9 % (ref 11.5–15.5)
WBC: 7.2 10*3/uL (ref 4.0–10.5)
nRBC: 0 % (ref 0.0–0.2)

## 2021-08-12 LAB — BASIC METABOLIC PANEL
Anion gap: 6 (ref 5–15)
BUN: 11 mg/dL (ref 8–23)
CO2: 28 mmol/L (ref 22–32)
Calcium: 8.2 mg/dL — ABNORMAL LOW (ref 8.9–10.3)
Chloride: 105 mmol/L (ref 98–111)
Creatinine, Ser: 0.41 mg/dL — ABNORMAL LOW (ref 0.44–1.00)
GFR, Estimated: >60 mL/min (ref >60)
Glucose, Bld: 90 mg/dL (ref 70–99)
Potassium: 3.7 mmol/L (ref 3.5–5.1)
Sodium: 139 mmol/L (ref 135–145)

## 2021-08-12 MED ORDER — POLYETHYLENE GLYCOL 3350 17 G PO PACK: 17.0000 g | PACK | Freq: Every day | ORAL | 0 refills | Status: DC | PRN

## 2021-08-12 MED ORDER — ACETAMINOPHEN 500 MG PO TABS: 500.0000 mg | ORAL_TABLET | Freq: Four times a day (QID) | ORAL | 0 refills | Status: DC | PRN

## 2021-08-12 MED ORDER — OXYCODONE HCL 5 MG PO TABS: 5.0000 mg | ORAL_TABLET | ORAL | 0 refills | Status: DC | PRN

## 2021-08-12 MED ORDER — ASCORBIC ACID 1000 MG PO TABS: 500.0000 mg | ORAL_TABLET | Freq: Every day | ORAL | 0 refills | Status: AC

## 2021-08-12 NOTE — Progress Notes (Signed)
Occupational Therapy Treatment ?Patient Details ?Name: Teresa Lin ?MRN: [id number] ?DOB: 03/09/1932 ?Today's Date: 08/12/2021 ? ? ?History of present illness Pt is an 88 y.o. female admitted 08/08/21 after fall sustaining open R transcondylar distal humerus fx. S/p R humeral ORIF, ulnar nerve neuroplasty 3/30. PMH includes HTN, IBS, DVT/PE, arthritis, anxiety, HOH, multiple joint replacements. ?  ?OT comments ? Patient returning to recliner with nursing using RW  from bathroom.  Patient instructed in AAROM to RUE elbow and AROM for RUE hand.  Patient stood with min assist and donned gown to cover back with mod assist and education to donn RUE first.  Patient able to stand at sink for grooming using LUE and min guard. Patient educated on RUE positioning while up in recliner to address edema. Acute OT to continue to follow.   ? ?Recommendations for follow up therapy are one component of a multi-disciplinary discharge planning process, led by the attending physician.  Recommendations may be updated based on patient status, additional functional criteria and insurance authorization. ?   ?Follow Up Recommendations ? Acute inpatient rehab (3hours/day)  ?  ?Assistance Recommended at Discharge Frequent or constant Supervision/Assistance  ?Patient can return home with the following ? A little help with walking and/or transfers;A lot of help with bathing/dressing/bathroom;Assistance with cooking/housework;Direct supervision/assist for medications management;Assistance with feeding;Assist for transportation;Help with stairs or ramp for entrance ?  ?Equipment Recommendations ? None recommended by OT  ?  ?Recommendations for Other Services   ? ?  ?Precautions / Restrictions Precautions ?Precautions: Fall ?Type of Shoulder Precautions: "Gentle motion of R elbow ok just no extension against resistance, no pushing up " ?Shoulder Interventions: Shoulder sling/immobilizer;For comfort ?Required Braces or Orthoses:  Sling ?Restrictions ?Weight Bearing Restrictions: Yes ?RUE Weight Bearing: Non weight bearing  ? ? ?  ? ?Mobility Bed Mobility ?Overal bed mobility: Needs Assistance ?  ?  ?  ?  ?  ?  ?General bed mobility comments: up in recliner upon arrival and end of session ?  ? ?Transfers ?Overall transfer level: Needs assistance ?Equipment used: Rolling walker (2 wheels) ?Transfers: Sit to/from Stand ?Sit to Stand: Min assist ?  ?  ?  ?  ?  ?General transfer comment: RW used to steady self with no WB through RUE ?  ?  ?Balance Overall balance assessment: Needs assistance ?  ?Sitting balance-Leahy Scale: Fair ?  ?  ?Standing balance support: Single extremity supported ?Standing balance-Leahy Scale: Poor ?Standing balance comment: min guard when standing for groomin ?  ?  ?  ?  ?  ?  ?  ?  ?  ?  ?  ?   ? ?ADL either performed or assessed with clinical judgement  ? ?ADL Overall ADL's : Needs assistance/impaired ?  ?  ?Grooming: Brushing hair;Standing;Min guard ?Grooming Details (indicate cue type and reason): stood at sink ?Upper Body Bathing: Moderate assistance;Standing ?Upper Body Bathing Details (indicate cue type and reason): donned gown to cover back with education to donn RUE first ?  ?  ?  ?  ?  ?  ?  ?  ?  ?  ?  ?  ?  ?General ADL Comments: education on donning RUE first with RUE ?  ? ?Extremity/Trunk Assessment Upper Extremity Assessment ?RUE Deficits / Details: decreased pain ?RUE Coordination: decreased fine motor;decreased gross motor ?  ?  ?  ?  ?  ? ?Vision   ?  ?  ?Perception   ?  ?Praxis   ?  ? ?  Cognition Arousal/Alertness: Awake/alert ?Behavior During Therapy: Impulsive ?Overall Cognitive Status: Impaired/Different from baseline ?Area of Impairment: Attention, Safety/judgement, Memory ?  ?  ?  ?  ?  ?  ?  ?  ?  ?Current Attention Level: Selective ?Memory: Decreased short-term memory ?Following Commands: Follows multi-step commands inconsistently ?Safety/Judgement: Decreased awareness of safety, Decreased  awareness of deficits ?Awareness: Emergent ?Problem Solving: Requires verbal cues ?General Comments: education on elevating RUE  to address edema ?  ?  ?   ?Exercises Exercises: General Upper Extremity ?General Exercises - Upper Extremity ?Elbow Flexion: AAROM, 5 reps, Right ?Elbow Extension: AAROM, Right, 5 reps ?Wrist Flexion: AROM, Right, 5 reps ?Wrist Extension: AROM, Left, 5 reps ?Digit Composite Flexion: AAROM, Right, 5 reps ?Composite Extension: AROM, Right, 5 reps ? ?  ?Shoulder Instructions   ? ? ?  ?General Comments education on RUE positioning in recliner to address edema  ? ? ?Pertinent Vitals/ Pain       Pain Assessment ?Pain Assessment: Faces ?Faces Pain Scale: Hurts a little bit ?Pain Location: RUE ?Pain Descriptors / Indicators: Guarding, Sore ?Pain Intervention(s): Limited activity within patient's tolerance, Monitored during session, Premedicated before session ? ?Home Living   ?  ?  ?  ?  ?  ?  ?  ?  ?  ?  ?  ?  ?  ?  ?  ?  ?  ?  ? ?  ?Prior Functioning/Environment    ?  ?  ?  ?   ? ?Frequency ? Min 3X/week  ? ? ? ? ?  ?Progress Toward Goals ? ?OT Goals(current goals can now be found in the care plan section) ? Progress towards OT goals: Progressing toward goals ? ?Acute Rehab OT Goals ?OT Goal Formulation: With patient ?Time For Goal Achievement: 08/23/21 ?Potential to Achieve Goals: Good ?ADL Goals ?Pt Will Perform Eating: with modified independence;sitting ?Pt Will Perform Grooming: with supervision;standing ?Pt Will Perform Upper Body Dressing: with supervision;sitting ?Pt Will Perform Lower Body Dressing: with supervision;sit to/from stand ?Pt Will Transfer to Toilet: with supervision;ambulating ?Pt Will Perform Toileting - Clothing Manipulation and hygiene: with supervision;sit to/from stand ?Pt/caregiver will Perform Home Exercise Program: Increased ROM;Right Upper extremity;With Supervision  ?Plan Discharge plan remains appropriate   ? ?Co-evaluation ? ? ?   ?  ?  ?  ?  ? ?  ?AM-PAC OT "6  Clicks" Daily Activity     ?Outcome Measure ? ? Help from another person eating meals?: A Little ?Help from another person taking care of personal grooming?: A Little ?Help from another person toileting, which includes using toliet, bedpan, or urinal?: A Lot ?Help from another person bathing (including washing, rinsing, drying)?: A Lot ?Help from another person to put on and taking off regular upper body clothing?: A Lot ?Help from another person to put on and taking off regular lower body clothing?: A Lot ?6 Click Score: 14 ? ?  ?End of Session Equipment Utilized During Treatment: Gait belt;Rolling walker (2 wheels) ? ?OT Visit Diagnosis: Unsteadiness on feet (R26.81);Other abnormalities of gait and mobility (R26.89);Pain ?Pain - Right/Left: Right ?Pain - part of body: Arm ?  ?Activity Tolerance Patient tolerated treatment well ?  ?Patient Left in chair;with call bell/phone within reach;with family/visitor present ?  ?Nurse Communication Mobility status ?  ? ?   ? ?Time: [phone] ?OT Time Calculation (min): 32 min ? ?Charges: OT General Charges ?$OT Visit: 1 Visit ?OT Treatments ?$Self Care/Home Management : 8-22 mins ?$Therapeutic Exercise: 8-22 mins ? ?  Lodema Hong, OTA ?Acute Rehabilitation Services  ?Pager [phone] ?Office [phone] ? ? ?Trixie Dredge ?08/12/2021, 10:33 AM ?

## 2021-08-12 NOTE — Plan of Care (Signed)
  Problem: Activity: Goal: Risk for activity intolerance will decrease Outcome: Progressing   Problem: Pain Managment: Goal: General experience of comfort will improve Outcome: Progressing   Problem: Safety: Goal: Ability to remain free from injury will improve Outcome: Progressing   

## 2021-08-12 NOTE — TOC Progression Note (Signed)
Transition of Care (TOC) - Progression Note  ? ? ?Patient Details  ?Name: Teresa Lin ?MRN: [id number] ?Date of Birth: [birth year] ? ?Transition of Care (TOC) CM/SW Contact  ?Bartholomew Crews, RN ?Phone Number: [phone] ?08/12/2021, 2:41 PM ? ?Clinical Narrative:    ? ?Spoke with patient and son, [name], at the bedside to discuss post acute transition. [place] agency choice is [facility]. Referral for PT/OT accepted by [facility]. Discussed need for platform attachment. Currently out of stock at [facility]. Demonstrated availability online and showed an example from PT. Son to provide transport home tomorrow morning.  ? ?Expected Discharge Plan: [place] ?Barriers to Discharge: Continued Medical Work up ? ?Expected Discharge Plan and Services ?Expected Discharge Plan: [place] ?In-house Referral: NA ?Discharge Planning Services: CM Consult ?Post Acute Care Choice: Home Health ?Living arrangements for the past 2 months: [place] ?Expected Discharge Date: 08/12/21               ?DME Arranged: [facility] platform ?DME Agency: [facility] ?Date DME Agency Contacted: 08/11/21 ?Time DME Agency Contacted: [phone] ?Representative spoke with at DME Agency: Delana Meyer ?HH Arranged: PT, OT ?[place] Agency: [place] [facility] ?Date HH Agency Contacted: 08/12/21 ?Time [place]: 1330 ?Representative spoke with at [place]: Corene Cornea ? ? ?Social Determinants of Health (SDOH) Interventions ?  ? ?Readmission Risk Interventions ?   ? View : No data to display.  ?  ?  ?  ? ? ?

## 2021-08-12 NOTE — Discharge Instructions (Signed)
NWB R upper extremity  ?            No pushing or pulling with R UEx ?            Do not lift anything heavier than a cup of drink  ?            Unrestricted ROM R elbow ?            Ice and elevate ?            Sling for comfort only, can be off when in chair or bed  ?           Elevate hand above elbow and elbow above heart for swelling control  ?  ?

## 2021-08-12 NOTE — Discharge Summary (Addendum)
?[facility] ? ?Physician Discharge Summary  ? ?Patient ID: ?Teresa Lin ?MRN: [id number] ?DOB/AGE: 86-Feb-1933 86 y.o. ? ?Admit date: 08/08/2021 ?Discharge date:   08/12/2021 ? ? ?PCP: Adin Hector, MD ? ?DISCHARGE DIAGNOSES:  ?Principal Problem: ?  Open fracture of right humerus ?Active Problems: ?  History of pulmonary embolism ?  Rheumatoid arthritis of multiple sites without rheumatoid factor ([place]) ?  Immunosuppression ([place]) ?  Hypokalemia ?  Normocytic anemia ? ? ?RECOMMENDATIONS FOR OUTPATIENT FOLLOW UP: ?Follow-up with Dr. Marcelino Scot with orthopedics as instructed by them ? ? ?Home Health: PT OT ?Equipment/Devices: None at this time ? ?CODE STATUS: Full code ? ?DISCHARGE CONDITION: fair ? ?Diet recommendation: As before ? ?INITIAL HISTORY: ?88 year old female with a history of rheumatoid arthritis, hypertension, history of DVT and PE on chronic Eliquis therapy presented to the ER after sustaining a fall at home.  This was a mechanical fall.  Evaluation at the [facility] ER revealed a compound fracture of the right humerus.  Due to lack of availability of the appropriate specialist in that hospital, patient was transferred here to [facility] for further management. Last dose of Eliquis was around 10 PM on 08/07/2021.  Patient underwent ORIF on 3/30. ? ?Consultants: Orthopedics, Dr. Marcelino Scot ?  ?Procedures:   ?1.  OPEN REDUCTION INTERNAL FIXATION OF RIGHT TRANSCONDYLAR HUMERUS FRACTURE. ?2.  ULNAR NERVE NEUROPLASTY. ?3.  ULNA OSTEOTOMY. ?4.  SURGICAL DEBRIDEMENT OF OPEN FRACTURE INCLUDING REMOVAL OF BONE ? ? ?HOSPITAL COURSE:  ? ? ?* Open fracture of right humerus ?Result of a mechanical fall.   ?Patient underwent ORIF on 3/30.   ?Seen by PT and OT.  Inpatient rehabilitation was recommended.  Patient evaluated by CIR and not thought to be a candidate for same.  Plan is now for home with home health. ?Eliquis has been resumed by orthopedic service.   ?Pain is reasonably well controlled. ? ?History of pulmonary  embolism ?Continue Eliquis ? ?Rheumatoid arthritis of multiple sites without rheumatoid factor ([place]) ?On methotrexate, plaquenil and Orencia as outpatient.   ?Methotrexate is a weekly injection.  Orencia injection given once a month. ? ?Normocytic anemia ?Drop in hemoglobin is noted.  Likely due to combination of fracture as well as surgery and dilutional drop.  Stable for the most part.  Did not require transfusions ? ?Hypokalemia ?Supplemented ? ? ?Patient is medically stable.  Okay for discharge.  Most likely to home with home health. ? ?PERTINENT LABS: ? ?The results of significant diagnostics from this hospitalization (including imaging, microbiology, ancillary and laboratory) are listed below for reference.   ? ?Microbiology: ?Recent Results (from the past 240 hour(s))  ?Resp Panel by RT-PCR (Flu A&B, Covid) Nasopharyngeal Swab     Status: None  ? Collection Time: 08/08/21  1:33 PM  ? Specimen: Nasopharyngeal Swab; Nasopharyngeal(NP) swabs in vial transport medium  ?Result Value Ref Range Status  ? SARS Coronavirus 2 by RT PCR NEGATIVE NEGATIVE Final  ?  Comment: (NOTE) ?SARS-CoV-2 target nucleic acids are NOT DETECTED. ? ?The SARS-CoV-2 RNA is generally detectable in upper respiratory ?specimens during the acute phase of infection. The lowest ?concentration of SARS-CoV-2 viral copies this assay can detect is ?138 copies/mL. A negative result does not preclude SARS-Cov-2 ?infection and should not be used as the sole basis for treatment or ?other patient management decisions. A negative result may occur with  ?improper specimen collection/handling, submission of specimen other ?than nasopharyngeal swab, presence of viral mutation(s) within the ?areas targeted by this assay,  and inadequate number of viral ?copies(<138 copies/mL). A negative result must be combined with ?clinical observations, patient history, and epidemiological ?information. The expected result is Negative. ? ?Fact Sheet for Patients:   ?EntrepreneurPulse.com.au ? ?Fact Sheet for Healthcare Providers:  ?IncredibleEmployment.be ? ?This test is no t yet approved or cleared by the [place] FDA and  ?has been authorized for detection and/or diagnosis of SARS-CoV-2 by ?FDA under an Emergency Use Authorization (EUA). This EUA will remain  ?in effect (meaning this test can be used) for the duration of the ?COVID-19 declaration under Section 564(b)(1) of the Act, 21 ?U.S.C.section 360bbb-3(b)(1), unless the authorization is terminated  ?or revoked sooner.  ? ? ?  ? Influenza A by PCR NEGATIVE NEGATIVE Final  ? Influenza B by PCR NEGATIVE NEGATIVE Final  ?  Comment: (NOTE) ?The Xpert Xpress SARS-CoV-2/FLU/RSV plus assay is intended as an aid ?in the diagnosis of influenza from Nasopharyngeal swab specimens and ?should not be used as a sole basis for treatment. Nasal washings and ?aspirates are unacceptable for Xpert Xpress SARS-CoV-2/FLU/RSV ?testing. ? ?Fact Sheet for Patients: ?EntrepreneurPulse.com.au ? ?Fact Sheet for Healthcare Providers: ?IncredibleEmployment.be ? ?This test is not yet approved or cleared by the [place] FDA and ?has been authorized for detection and/or diagnosis of SARS-CoV-2 by ?FDA under an Emergency Use Authorization (EUA). This EUA will remain ?in effect (meaning this test can be used) for the duration of the ?COVID-19 declaration under Section 564(b)(1) of the Act, 21 U.S.C. ?section 360bbb-3(b)(1), unless the authorization is terminated or ?revoked. ? ?Performed at [facility], [place], ?[place] [address] ?  ?Surgical pcr screen     Status: None  ? Collection Time: 08/09/21 12:06 AM  ? Specimen: Nasal Mucosa; Nasal Swab  ?Result Value Ref Range Status  ? MRSA, PCR NEGATIVE NEGATIVE Final  ? Staphylococcus aureus NEGATIVE NEGATIVE Final  ?  Comment: (NOTE) ?The Xpert SA Assay (FDA approved for NASAL specimens in patients  88 ?years of age and older), is one component of a comprehensive ?surveillance program. It is not intended to diagnose infection nor to ?guide or monitor treatment. ?Performed at [facility], [place] [address], [place], [place] ?[address] ?  ?  ? ?Labs: ? ?COVID-19 Labs ? ? ?Lab Results  ?Component Value Date  ? [place] NEGATIVE 08/08/2021  ? SARSCOV2NAA POSITIVE (A) 05/29/2021  ? [place] NEGATIVE 01/20/2020  ? SARSCOV2NAA NOT DETECTED 09/21/2018  ? ? ? ? ?Basic Metabolic Panel: ?Recent Labs  ?Lab 08/08/21 ?1333 08/09/21 ?0135 08/10/21 ?0306 08/11/21 ?6759 08/12/21 ?1638  ?NA 137 138 134* 134* 139  ?K 3.8 3.3* 4.0 3.6 3.7  ?CL 100 103 101 101 105  ?CO2 '26 25 26 27 28  '$ ?GLUCOSE 143* 121* 106* 101* 90  ?BUN '18 11 9 '$ 7* 11  ?CREATININE 0.63 0.55 0.53 0.43* 0.41*  ?CALCIUM 8.8* 8.4* 8.3* 7.9* 8.2*  ?MG  --  1.9  --   --   --   ? ?Liver Function Tests: ?Recent Labs  ?Lab 08/09/21 ?0135  ?AST 29  ?ALT 16  ?ALKPHOS 41  ?BILITOT 0.6  ?PROT 6.1*  ?ALBUMIN 3.3*  ? ? ?CBC: ?Recent Labs  ?Lab 08/08/21 ?1333 08/09/21 ?0135 08/10/21 ?0306 08/11/21 ?4665 08/12/21 ?9935  ?WBC 9.0 7.9 11.5* 8.5 7.2  ?NEUTROABS 7.7 6.2  --   --   --   ?HGB 13.3 12.3 11.3* 10.1* 11.4*  ?HCT 41.3 36.6 34.4* 30.6* 33.7*  ?MCV 96.9 94.8 95.8 95.9 95.7  ?  PLT 292 281 255 229 262  ? ? ? ? ?IMAGING STUDIES ?DG Shoulder Right ? ?Result Date: 08/08/2021 ?CLINICAL DATA:  Fall, pain EXAM: RIGHT SHOULDER - 2+ VIEW COMPARISON:  None. FINDINGS: Status post right shoulder reverse arthroplasty. No evidence of perihardware fracture or component malpositioning. The acromioclavicular joint spaces preserved. Partially imaged right chest is unremarkable. IMPRESSION: Status post right shoulder reverse arthroplasty. No evidence of perihardware fracture or component malpositioning. Electronically Signed   By: Delanna Ahmadi M.D.   On: 08/08/2021 13:38  ? ?DG Elbow 2 Views Right ? ?Result Date: 08/09/2021 ?CLINICAL DATA:  Postoperative EXAM: RIGHT ELBOW - 2 VIEW  COMPARISON:  08/08/2021 radiographs, 08/09/2021 intraoperative fluoroscopy FINDINGS: Status post interval extensive plate and screw fixation of a comminuted displaced fracture of the distal humerus with intra-articular exte

## 2021-08-12 NOTE — Progress Notes (Addendum)
Spoke with patient's son who is healthcare power of attorney with his wife present.  This was outside the patient room.  Discussed definition of medical necessity for the inpatient rehab setting.  Discussed that the patient does not meet these criteria.   We discussed recommendations for 24/7 supervision with continued PT OT.  The patient is likely to require this level of supervision for the next 6 weeks until nonweightbearing status right upper extremity is lifted.  Family deciding whether they can provide this at home or pursue SNF.  We discussed that this was 1 of multiple falls and that patient will continue to to be at a risk for falling even after she completes therapy and weightbearing restrictions are lifted.  She is on anticoagulation which increases risk of injury.  In addition discussed longer term care options including assisted living. ?

## 2021-08-13 LAB — BASIC METABOLIC PANEL
Anion gap: 6 (ref 5–15)
BUN: 15 mg/dL (ref 8–23)
CO2: 28 mmol/L (ref 22–32)
Calcium: 8.8 mg/dL — ABNORMAL LOW (ref 8.9–10.3)
Chloride: 107 mmol/L (ref 98–111)
Creatinine, Ser: 0.46 mg/dL (ref 0.44–1.00)
GFR, Estimated: >60 mL/min (ref >60)
Glucose, Bld: 94 mg/dL (ref 70–99)
Potassium: 4.3 mmol/L (ref 3.5–5.1)
Sodium: 141 mmol/L (ref 135–145)

## 2021-08-13 LAB — CBC
HCT: 32.4 % — ABNORMAL LOW (ref 36.0–46.0)
Hemoglobin: 10.8 g/dL — ABNORMAL LOW (ref 12.0–15.0)
MCH: 32.2 pg (ref 26.0–34.0)
MCHC: 33.3 g/dL (ref 30.0–36.0)
MCV: 96.7 fL (ref 80.0–100.0)
Platelets: 276 10*3/uL (ref 150–400)
RBC: 3.35 MIL/uL — ABNORMAL LOW (ref 3.87–5.11)
RDW: 13.7 % (ref 11.5–15.5)
WBC: 6.3 10*3/uL (ref 4.0–10.5)
nRBC: 0 % (ref 0.0–0.2)

## 2021-08-13 NOTE — TOC Transition Note (Addendum)
Transition of Care (TOC) - CM/SW Discharge Note ? ? ?Patient Details  ?Name: Teresa Lin ?MRN: [id number] ?Date of Birth: 1931/08/29 ? ?Transition of Care (TOC) CM/SW Contact:  ?Bartholomew Crews, RN ?Phone Number: [phone] ?08/13/2021, 10:07 AM ? ? ?Clinical Narrative:    ? ?Patient to transition home today with family support. Liaison for [facility], made aware. Family to provide transportation home. No further TOC needs identified.  ? ?Update: Spoke with Jasmine at [facility]. Platform walker attachments are available. Will deliver to the room. Son and patient made aware.  ? ?Final next level of care: [place] ?Barriers to Discharge: No Barriers Identified ? ? ?Patient Goals and CMS Choice ?Patient states their goals for this hospitalization and ongoing recovery are:: return home with family/caregiver support ?CMS Medicare.gov Compare Post Acute Care list provided to:: Patient ?Choice offered to / list presented to : Adult Children ? ?Discharge Placement ?  ?           ?  ?  ?  ?  ? ?Discharge Plan and Services ?In-house Referral: NA ?Discharge Planning Services: CM Consult ?Post Acute Care Choice: Home Health          ?DME Arranged: Gilford Rile platform ?DME Agency: [employer] ?Date DME Agency Contacted: 08/11/21 ?Time DME Agency Contacted: [phone] ?Representative spoke with at DME Agency: Delana Meyer ?HH Arranged: PT, OT ?[place] Agency: [facility] ([place]) ?Date HH Agency Contacted: 08/13/21 ?Time [place]: 1330 ?Representative spoke with at [place]: Ramond Marrow ? ?Social Determinants of Health (SDOH) Interventions ?  ? ? ?Readmission Risk Interventions ?   ? View : No data to display.  ?  ?  ?  ? ? ? ? ? ?

## 2021-08-13 NOTE — Progress Notes (Signed)
Patient seen and examined.  Sitting on the chair eating breakfast.  No complaints offered.  Son is at bedside.  Questions answered.  It looks like case management has arranged for home health.  Blood work from this morning reviewed. ?No change in examination. ?Remains stable for discharge. Please review Discharge summary from 08/12/21. ? ?Bonnielee Haff ?08/13/2021 ? ?

## 2021-08-13 NOTE — Progress Notes (Signed)
Physical Therapy Treatment ?Patient Details ?Name: Teresa Lin ?MRN: [id number] ?DOB: Jul 15, 1931 ?Today's Date: 08/13/2021 ? ? ?History of Present Illness Pt is an 88 y.o. female admitted 08/08/21 after fall sustaining open R transcondylar distal humerus fx. S/p R humeral ORIF, ulnar nerve neuroplasty 3/30. PMH includes HTN, IBS, DVT/PE, arthritis, anxiety, HOH, multiple joint replacements. ? ?  ?PT Comments  ? ? Pt progressing towards physical therapy goals. Was able to perform transfers with gross min guard assist and ambulation with occasional min assist for balance support. Pt progressed from HHA to [facility]. For household ambulation, feel the cane would be appropriate if she is comfortable. R platform to be delivered prior to d/c per CM and use of platform OK'd by ortho PA. Reviewed precautions, sling management/positioning/wearing schedule, recommendations for activity progression, DME use ([facility] for nighttime as she gets up several times to void), and car transfer. Will continue to follow and progress as able per POC.  ?  ?Recommendations for follow up therapy are one component of a multi-disciplinary discharge planning process, led by the attending physician.  Recommendations may be updated based on patient status, additional functional criteria and insurance authorization. ? ?Follow Up Recommendations ? Acute inpatient rehab (3hours/day) ?  ?  ?Assistance Recommended at Discharge Frequent or constant Supervision/Assistance  ?Patient can return home with the following A little help with walking and/or transfers;A little help with bathing/dressing/bathroom;Assistance with cooking/housework;Assist for transportation;Help with stairs or ramp for entrance;Direct supervision/assist for medications management ?  ?Equipment Recommendations ?  (TBD)  ?  ?Recommendations for Other Services   ? ? ?  ?Precautions / Restrictions Precautions ?Precautions: Fall ?Type of Shoulder Precautions: "Gentle motion of R elbow ok just no  extension against resistance, no pushing up " ?Shoulder Interventions: Shoulder sling/immobilizer;For comfort ?Precaution Comments: Reviewed general safety precautions during functional mobility. ?Required Braces or Orthoses: Sling ?Restrictions ?Weight Bearing Restrictions: Yes ?RUE Weight Bearing: Non weight bearing  ?  ? ?Mobility ? Bed Mobility ?  ?  ?  ?  ?  ?  ?  ?General bed mobility comments: Pt was received sitting up in the recliner. ?  ? ?Transfers ?Overall transfer level: Needs assistance ?Equipment used: 1 person hand held assist, Straight cane ?Transfers: Sit to/from Stand ?Sit to Stand: Min guard ?  ?  ?  ?  ?  ?General transfer comment: Initially with HHA and then practiced with SPC. No assist required to power up to full stand. Hands on guarding for stand to sit due to decreased controlled lower to chair. ?  ? ?Ambulation/Gait ?Ambulation/Gait assistance: Min guard, Min assist ?Gait Distance (Feet): 200 Feet ?Assistive device: 1 person hand held assist, Straight cane ?Gait Pattern/deviations: Step-through pattern, Decreased stride length, Trunk flexed ?Gait velocity: Decreased ?Gait velocity interpretation: <1.31 ft/sec, indicative of household ambulator ?  ?General Gait Details: Initially with HHA progressing to [facility]. Occasional min assist required however grossly at a min guard level. ? ? ?Stairs ?  ?  ?  ?  ?  ? ? ?Wheelchair Mobility ?  ? ?Modified Rankin (Stroke Patients Only) ?  ? ? ?  ?Balance Overall balance assessment: Needs assistance ?  ?Sitting balance-Leahy Scale: Fair ?  ?  ?Standing balance support: Single extremity supported ?Standing balance-Leahy Scale: Fair ?Standing balance comment: Statically ?  ?  ?  ?  ?  ?  ?  ?  ?  ?  ?  ?  ? ?  ?Cognition Arousal/Alertness: Awake/alert ?Behavior During Therapy: Impulsive ?Overall Cognitive Status:  Within Functional Limits for tasks assessed ?  ?  ?  ?  ?  ?  ?  ?  ?  ?  ?  ?  ?  ?  ?  ?  ?  ?  ?  ? ?  ?Exercises   ? ?  ?General Comments   ?   ?  ? ?Pertinent Vitals/Pain Pain Assessment ?Pain Assessment: Faces ?Faces Pain Scale: Hurts a little bit ?Pain Location: RUE ?Pain Descriptors / Indicators: Guarding, Sore ?Pain Intervention(s): Monitored during session, Limited activity within patient's tolerance, Repositioned  ? ? ?Home Living   ?  ?  ?  ?  ?  ?  ?  ?  ?  ?   ?  ?Prior Function    ?  ?  ?   ? ?PT Goals (current goals can now be found in the care plan section) Acute Rehab PT Goals ?Patient Stated Goal: No further falls ?PT Goal Formulation: With patient/family ?Time For Goal Achievement: 08/24/21 ?Potential to Achieve Goals: Good ?Progress towards PT goals: Progressing toward goals ? ?  ?Frequency ? ? ? Min 5X/week ? ? ? ?  ?PT Plan Current plan remains appropriate  ? ? ?Co-evaluation   ?  ?  ?  ?  ? ?  ?AM-PAC PT "6 Clicks" Mobility   ?Outcome Measure ? Help needed turning from your back to your side while in a flat bed without using bedrails?: A Little ?Help needed moving from lying on your back to sitting on the side of a flat bed without using bedrails?: A Little ?Help needed moving to and from a bed to a chair (including a wheelchair)?: A Little ?Help needed standing up from a chair using your arms (e.g., wheelchair or bedside chair)?: A Little ?Help needed to walk in hospital room?: A Little ?Help needed climbing 3-5 steps with a railing? : A Little ?6 Click Score: 18 ? ?  ?End of Session Equipment Utilized During Treatment: Gait belt ?Activity Tolerance: Patient tolerated treatment well ?Patient left: in chair;with call bell/phone within reach;with family/visitor present ?Nurse Communication: Mobility status ?PT Visit Diagnosis: Other abnormalities of gait and mobility (R26.89);Muscle weakness (generalized) (M62.81);Pain ?Pain - Right/Left: Right ?Pain - part of body: Shoulder ?  ? ? ?Time: 1001-1026 ?PT Time Calculation (min) (ACUTE ONLY): 25 min ? ?Charges:  $Gait Training: 8-22 mins ?$Self Care/Home Management: 8-22          ?           ? ?Rolinda Roan, PT, DPT ?Acute Rehabilitation Services ?Pager: [phone] ?Office: [phone]  ? ? ?Teresa Lin ?08/13/2021, 10:43 AM ? ?

## 2021-09-05 ENCOUNTER — Other Ambulatory Visit: Payer: Self-pay | Admitting: Internal Medicine

## 2021-09-05 DIAGNOSIS — R413 Other amnesia: Secondary | ICD-10-CM

## 2021-09-14 ENCOUNTER — Ambulatory Visit
Admission: RE | Admit: 2021-09-14 | Discharge: 2021-09-14 | Disposition: A | Discharge status: Home / Self Care | Payer: Medicare Other | Source: Ambulatory Visit | Attending: Internal Medicine | Admitting: Internal Medicine

## 2021-09-14 DIAGNOSIS — R413 Other amnesia: Secondary | ICD-10-CM

## 2021-09-20 ENCOUNTER — Encounter: Payer: Self-pay | Admitting: Occupational Therapy

## 2021-09-20 ENCOUNTER — Ambulatory Visit: Payer: Medicare Other | Attending: Orthopedic Surgery | Admitting: Occupational Therapy

## 2021-09-20 DIAGNOSIS — M79641 Pain in right hand: Secondary | ICD-10-CM | POA: Diagnosis present

## 2021-09-20 DIAGNOSIS — R600 Localized edema: Secondary | ICD-10-CM

## 2021-09-20 DIAGNOSIS — L905 Scar conditions and fibrosis of skin: Secondary | ICD-10-CM | POA: Diagnosis present

## 2021-09-20 DIAGNOSIS — M25631 Stiffness of right wrist, not elsewhere classified: Secondary | ICD-10-CM

## 2021-09-20 DIAGNOSIS — M25521 Pain in right elbow: Secondary | ICD-10-CM

## 2021-09-20 DIAGNOSIS — M25531 Pain in right wrist: Secondary | ICD-10-CM

## 2021-09-20 DIAGNOSIS — M6281 Muscle weakness (generalized): Secondary | ICD-10-CM | POA: Diagnosis present

## 2021-09-20 NOTE — Therapy (Signed)
[facility] ?[address] ?[place], [place], [address] ?Phone: [phone]   Fax:  [phone] ? ?Occupational Therapy Evaluation ? ?Patient Details  ?Name: Teresa Lin ?MRN: [id number] ?Date of Birth: August 16, 1931 ?Referring Provider (OT): Dr Marcelino Scot ? ? ?Encounter Date: 09/20/2021 ? ? OT End of Session - 09/20/21 1514   ? ? Visit Number 1   ? Number of Visits 16   ? Date for OT Re-Evaluation 11/15/21   ? OT Start Time 1115   ? OT Stop Time 1218   ? OT Time Calculation (min) 63 min   ? Activity Tolerance Patient tolerated treatment well   ? Behavior During Therapy [facility] for tasks assessed/performed   ? ?  ?  ? ?  ? ? ?Past Medical History:  ?Diagnosis Date  ? Anemia   ? Anxiety   ? Arthritis   ? osteoarthritis. Bilateral knee replacement, hands, meniscal tear, cervical disc disease  ? Atherosclerosis   ? Atypical chest pain   ? Collagen vascular disease ([place])   ? Colon polyp 12/15/2017  ? Colon polyps   ? Compression fracture of L2 ([place])   ? Diverticulosis   ? DVT (deep venous thrombosis) ([place])   ? Fracture of one rib, right side, subsequent encounter for fracture with routine healing 03/08/2017  ? GERD (gastroesophageal reflux disease)   ? History of seronegative inflammatory arthritis   ? HOH (hard of hearing)   ? no hearing aids  ? Hypertension   ? IBS (irritable bowel syndrome)   ? Internal hemorrhoids   ? Phlebitis and thrombophlebitis of deep veins of lower extremities (HCC)   ? after surgery  ? Vision abnormalities   ? ? ?Past Surgical History:  ?Procedure Laterality Date  ? APPENDECTOMY    ? COLONOSCOPY N/A 01/24/2020  ? Procedure: COLONOSCOPY;  Surgeon: Lesly Rubenstein, MD;  Location: [facility] ENDOSCOPY;  Service: Endoscopy;  Laterality: N/A;  ? COLONOSCOPY WITH PROPOFOL N/A 10/24/2017  ? Procedure: COLONOSCOPY WITH PROPOFOL;  Surgeon: Manya Silvas, MD;  Location: [facility] ENDOSCOPY;  Service: Endoscopy;  Laterality: N/A;  ? COLONOSCOPY WITH PROPOFOL  N/A 11/21/2017  ? Procedure: COLONOSCOPY WITH PROPOFOL;  Surgeon: Manya Silvas, MD;  Location: [facility] ENDOSCOPY;  Service: Endoscopy;  Laterality: N/A;  ? DILATION AND CURETTAGE OF UTERUS    ? ESOPHAGOGASTRODUODENOSCOPY (EGD) WITH PROPOFOL N/A 02/25/2018  ? Procedure: ESOPHAGOGASTRODUODENOSCOPY (EGD) WITH PROPOFOL;  Surgeon: Manya Silvas, MD;  Location: [facility] ENDOSCOPY;  Service: Endoscopy;  Laterality: N/A;  ? EYE SURGERY    ? bilateral catarACT  ? JOINT REPLACEMENT Bilateral   ? knee replacement  ? LAPAROSCOPIC RIGHT COLECTOMY Right 12/15/2017  ? Procedure: LAPAROSCOPIC RIGHT COLECTOMY;  Surgeon: Robert Bellow, MD;  Location: [facility] ORS;  Service: General;  Laterality: Right;  ? ORIF HUMERUS FRACTURE Right 08/09/2021  ? Procedure: IRRIGATION AND DEBRIDEMENT; OPEN REDUCTION INTERNAL FIXATION (ORIF) DISTAL HUMERUS FRACTURE;  Surgeon: Altamese McCoy, MD;  Location: [facility];  Service: Orthopedics;  Laterality: Right;  ? REPLACEMENT TOTAL KNEE BILATERAL    ? TOTAL SHOULDER ARTHROPLASTY Right 09/24/2018  ? Procedure: TOTAL SHOULDER ARTHROPLASTY - REVERSE RIGHT;  Surgeon: Corky Mull, MD;  Location: [facility] ORS;  Service: Orthopedics;  Laterality: Right;  ? ? ?There were no vitals filed for this visit. ? ? Subjective Assessment - 09/20/21 1506   ? ? Subjective  My wrist and hand bothers me more than my elbow.  I think have done really good  actually with my elbow what you think.  I did bring a sheet with exercises that they told me to do in the hospital and at home.   ? Pertinent History Pt is an 88 y.o. female admitted 08/08/21 after fall sustaining open R supracondylar distal humerus fx. S/p R humeral ORIF, ulnar nerve neuroplasty 3/30 by Dr Marcelino Scot. PMH includes HTN, IBS, DVT/PE, arthritis, anxiety, HOH, multiple joint replacements. Pt was in hospital recieving OT and PT followed by [facility]. Pt refer to OT with increase pain and swelling in R hand and wrist after fall. Had to stop her arthritis medication while when she had  to had surgery.   ? Patient Stated Goals I am right-handed so I really need my elbow, wrist and hand.  I want to get my motion and strength better in my right arm and hand, wrist so that I can do the things around the house that I need to do.   ? Currently in Pain? Yes   but no nr provided  ? ?  ?  ? ?  ? ? ? ? [facility] OT Assessment - 09/20/21 0001   ? ?  ? Assessment  ? Medical Diagnosis R elbow supracondylar fx ORIF, RA   ? Referring Provider (OT) Dr Marcelino Scot   ? Onset Date/Surgical Date 08/09/21   ? Hand Dominance Right   ? Next MD Visit rheumathology 22nd May   ? Prior Therapy inpt OT and PT , as well as HH   ?  ? Precautions  ? Precaution Comments WBAT   ?  ? Home  Environment  ? Lives With Alone   ?  ? Prior Function  ? Vocation Retired   ? Leisure Read, gardening, some cooking , like sports   ?  ? AROM  ? Right Elbow Flexion -30   ? Right Elbow Extension 130   ? Right Forearm Pronation 90 Degrees   ? Right Forearm Supination 75 Degrees   ? Right Wrist Extension 20 Degrees   ? Right Wrist Flexion 30 Degrees   ? Right Wrist Radial Deviation 0 Degrees   ? Right Wrist Ulnar Deviation 20 Degrees   ?  ? Right Hand AROM  ? R Thumb Radial ABduction/ADduction 0-55 40   ? R Thumb Palmar ABduction/ADduction 0-45 40   ? R Thumb Opposition to Index --   opposition to 5th  ? R Index  MCP 0-90 75 Degrees   ? R Index PIP 0-100 70 Degrees   ? R Long  MCP 0-90 80 Degrees   ? R Long PIP 0-100 50 Degrees   ? R Ring  MCP 0-90 85 Degrees   ? R Ring PIP 0-100 100 Degrees   ? R Little  MCP 0-90 90 Degrees   ? R Little PIP 0-100 95 Degrees   ? ?  ?  ? ?  ? ? ? ? ? ?  Done some heat to elbow and right wrist prior to review of home program ?Patient educated and home exercises changed for patient to do interlocking active assisted range of motion for elbow flexion to neck or ear 10 reps. ?Elbow extension with support on pillow doing active assisted range of motion with light passive range of motion for elbow extension.  10 reps hold 5 seconds  each ?Elbow to side patient to do active range of motion for supination pronation 10 reps. ?Wrist extension and flexion over armrest 10 reps.  Patient continued to same home program for  opposition tendon gliding overhead active range of motion for shoulder. ?Patient fitted with a Isotoner glove to use mostly at nighttime. ?[facility] neoprene soft brace provided for thumb and radial wrist to decrease pain and swelling. ?Patient educated on scar adhesions will address scar massage as well as [employer] -Care scar pad next session we will assess next session if patient needs contrast to decrease swelling and inflammation.  Patient to do home program 3 times a day keeping pain under 2 out of 10 ? ? ? ? ? ? ? ? ? ? ? ? ? ? OT Education - 09/20/21 1513   ? ? Education Details Findings of eval and review of home program changes, fitted with Isotoner glove and [facility] neoprene brace   ? Person(s) Educated Patient   ? Methods Explanation;Demonstration;Tactile cues;Verbal cues;Handout   ? Comprehension Verbal cues required;Returned demonstration;Verbalized understanding   ? ?  ?  ? ?  ? ? ? OT Short Term Goals - 09/20/21 1520   ? ?  ? OT SHORT TERM GOAL #1  ? Title Pt to be independent in HEP to increase AROM in right elbow, wrist and hand  with less pain   ? Baseline Elbow extension -30, flexion 130, supination 75 with all pain increased endrange.  Wrist extension decreased as well as flexion compared to September last year   ? Time 3   ? Period Weeks   ? Status New   ? Target Date 10/11/21   ? ?  ?  ? ?  ? ? ? ? OT Long Term Goals - 09/20/21 1521   ? ?  ? OT LONG TERM GOAL #1  ? Title Right elbow flexion increased for patient to be able to put her hearing aids in and fix her hair with right dominant hand with pain less than 3/10   ? Baseline Patient report able to start eating a little bit with the right hand but limited with supination 75 degrees, elbow flexion 130 with pain   ? Time 4   ? Period Weeks   ? Status New   ? Target Date  10/18/21   ?  ? OT LONG TERM GOAL #2  ? Title Right elbow extension increased with more than 15 degrees for patient to be able to reach in pocket and shelves without pain.   ? Baseline Elbow extension -30

## 2021-09-25 ENCOUNTER — Ambulatory Visit: Payer: Medicare Other | Admitting: Occupational Therapy

## 2021-09-25 DIAGNOSIS — M25521 Pain in right elbow: Secondary | ICD-10-CM | POA: Diagnosis not present

## 2021-09-25 DIAGNOSIS — M79641 Pain in right hand: Secondary | ICD-10-CM

## 2021-09-25 DIAGNOSIS — R600 Localized edema: Secondary | ICD-10-CM

## 2021-09-25 DIAGNOSIS — M6281 Muscle weakness (generalized): Secondary | ICD-10-CM

## 2021-09-25 DIAGNOSIS — L905 Scar conditions and fibrosis of skin: Secondary | ICD-10-CM

## 2021-09-25 DIAGNOSIS — M25631 Stiffness of right wrist, not elsewhere classified: Secondary | ICD-10-CM

## 2021-09-25 DIAGNOSIS — M25531 Pain in right wrist: Secondary | ICD-10-CM

## 2021-09-25 NOTE — Therapy (Signed)
[place] [facility] ?[address] ?[place], [place], [address] ?Phone: [phone]   Fax:  [phone] ? ?Occupational Therapy Treatment ? ?Patient Details  ?Name: Teresa Lin ?MRN: [id number] ?Date of Birth: [birth year] ?Referring Provider (OT): Dr Marcelino Scot ? ? ?Encounter Date: 09/25/2021 ? ? OT End of Session - 09/25/21 1306   ? ? Visit Number 2   ? Number of Visits 16   ? Date for OT Re-Evaluation 11/15/21   ? OT Start Time 1200   ? OT Stop Time 1240   ? OT Time Calculation (min) 40 min   ? Activity Tolerance Patient tolerated treatment well   ? Behavior During Therapy [facility] for tasks assessed/performed   ? ?  ?  ? ?  ? ? ?Past Medical History:  ?Diagnosis Date  ? Anemia   ? Anxiety   ? Arthritis   ? osteoarthritis. Bilateral knee replacement, hands, meniscal tear, cervical disc disease  ? Atherosclerosis   ? Atypical chest pain   ? Collagen vascular disease ([place])   ? Colon polyp 12/15/2017  ? Colon polyps   ? Compression fracture of L2 ([place])   ? Diverticulosis   ? DVT (deep venous thrombosis) ([place])   ? Fracture of one rib, right side, subsequent encounter for fracture with routine healing 03/08/2017  ? GERD (gastroesophageal reflux disease)   ? History of seronegative inflammatory arthritis   ? HOH (hard of hearing)   ? no hearing aids  ? Hypertension   ? IBS (irritable bowel syndrome)   ? Internal hemorrhoids   ? Phlebitis and thrombophlebitis of deep veins of lower extremities (HCC)   ? after surgery  ? Vision abnormalities   ? ? ?Past Surgical History:  ?Procedure Laterality Date  ? APPENDECTOMY    ? COLONOSCOPY N/A 01/24/2020  ? Procedure: COLONOSCOPY;  Surgeon: Lesly Rubenstein, MD;  Location: [facility] ENDOSCOPY;  Service: Endoscopy;  Laterality: N/A;  ? COLONOSCOPY WITH PROPOFOL N/A 10/24/2017  ? Procedure: COLONOSCOPY WITH PROPOFOL;  Surgeon: Manya Silvas, MD;  Location: [facility] ENDOSCOPY;  Service: Endoscopy;  Laterality: N/A;  ? COLONOSCOPY WITH PROPOFOL  N/A 11/21/2017  ? Procedure: COLONOSCOPY WITH PROPOFOL;  Surgeon: Manya Silvas, MD;  Location: [facility] ENDOSCOPY;  Service: Endoscopy;  Laterality: N/A;  ? DILATION AND CURETTAGE OF UTERUS    ? ESOPHAGOGASTRODUODENOSCOPY (EGD) WITH PROPOFOL N/A 02/25/2018  ? Procedure: ESOPHAGOGASTRODUODENOSCOPY (EGD) WITH PROPOFOL;  Surgeon: Manya Silvas, MD;  Location: [facility] ENDOSCOPY;  Service: Endoscopy;  Laterality: N/A;  ? EYE SURGERY    ? bilateral catarACT  ? JOINT REPLACEMENT Bilateral   ? knee replacement  ? LAPAROSCOPIC RIGHT COLECTOMY Right 12/15/2017  ? Procedure: LAPAROSCOPIC RIGHT COLECTOMY;  Surgeon: Robert Bellow, MD;  Location: [facility] ORS;  Service: General;  Laterality: Right;  ? ORIF HUMERUS FRACTURE Right 08/09/2021  ? Procedure: IRRIGATION AND DEBRIDEMENT; OPEN REDUCTION INTERNAL FIXATION (ORIF) DISTAL HUMERUS FRACTURE;  Surgeon: Altamese Solano, MD;  Location: [place];  Service: Orthopedics;  Laterality: Right;  ? REPLACEMENT TOTAL KNEE BILATERAL    ? TOTAL SHOULDER ARTHROPLASTY Right 09/24/2018  ? Procedure: TOTAL SHOULDER ARTHROPLASTY - REVERSE RIGHT;  Surgeon: Corky Mull, MD;  Location: [facility] ORS;  Service: Orthopedics;  Laterality: Right;  ? ? ?There were no vitals filed for this visit. ? ? Subjective Assessment - 09/25/21 1305   ? ? Subjective  My elbow has been swelling since last time and hurting.  I wanted to go over with  me my exercises again I did get a heating pad.  And try not to lift things too heavy I did try and open the windows because my ear is still off.  I may be picking up something this may be little bit too much.  I just want to get better   ? Pertinent History Pt is an 88 y.o. female admitted 08/08/21 after fall sustaining open R supracondylar distal humerus fx. S/p R humeral ORIF, ulnar nerve neuroplasty 3/30 by Dr Marcelino Scot. PMH includes HTN, IBS, DVT/PE, arthritis, anxiety, HOH, multiple joint replacements. Pt was in hospital recieving OT and PT followed by [facility]. Pt refer to OT with  increase pain and swelling in R hand and wrist after fall. Had to stop her arthritis medication while when she had to had surgery.   ? Patient Stated Goals I am right-handed so I really need my elbow, wrist and hand.  I want to get my motion and strength better in my right arm and hand, wrist so that I can do the things around the house that I need to do.   ? Currently in Pain? Yes   ? Pain Score 3    ? Pain Location Elbow   ? Pain Orientation Right   ? Pain Descriptors / Indicators Tender;Sore   ? Pain Type Surgical pain   ? Pain Onset More than a month ago   ? Pain Frequency Intermittent   ? ?  ?  ? ?  ?Reviewed with patient per request use of a heating pad for elbow extension stretch.  Patient to support on pillow use heating pad with a light bag of beans or rice that is about less than a pound for his gentle stretch.  Prior to range of motion. ?Cica -Care scar pad provided for patient to use at nighttime and educated on use.  Fabricated a elbow pad with padding for patient to use during the day for propping up the elbow as well as at nighttime for cushioning to decrease pain and tenderness as well as swelling to right elbow. ? ? ? ? ? ? ? ? ? ? ? ? ? ? OT Treatments/Exercises (OP) - 09/25/21 0001   ? ?  ? RUE Paraffin  ? Number Minutes Paraffin 8 Minutes   ? RUE Paraffin Location --   elbow  ? Comments Prior to passive range of motion and use of heating pad for slight extension stretch prior to soft tissue and range of motion   ? ?  ?  ? ?  ? ?  Reviewed with patient again home exercises upon request for reviewing and patient needed min assist to mod assist with some.  ? Interlocking active assisted range of motion for elbow flexion to neck or ear on both sides 10 reps. ? ?Elbow extension with support on pillow doing active assisted range of motion with light passive range of motion for elbow extension and palm up.  10 reps hold 5 seconds each ?Elbow to side passive range of motion and active assisted range of  motion for supination pronation 10 reps.  Followed by 1 pound weight this date by OT.   ?Wrist extension and flexion over armrest 10 reps.  Patient can assess as needed for extension and doing 10 reps of 1 pound weight.  ? Patient continued to same home program for opposition tendon gliding overhead active range of motion for shoulder. ?Reviewed again use of Isotoner glove mostly at nighttime.  Patient needed min assist  with moderate verbal encouragement. ? ?CMC neoprene soft brace provided last time for thumb and radial wrist to decrease pain and swelling.  Did had decreased pain this date and swelling in hand and wrist.  Done active assisted range of motion for composite flexion of second, fourth and fifth digits followed by placed on hold and composite fist.  Patient did had decreased flexion in third digit in previous years. ? ?We will initiate scar massage education.  Done scar massage by OT and mini massager patient with increased pain and tenderness.  Attempted soft tissue with [place] over bicep patient not tolerating very well switch to manual therapy by OT or soft tissue massage prior to elbow extension.  Done in supine elbow stretch extension as well as punching up to ceiling with active assisted range of motion and cueing 1 pound weight. ?Patient pain Below 3 out of 10 during session. ? ? ? ? ? ? OT Education - 09/25/21 1306   ? ? Education Details Progress and review of home exercises as well as scar massage, Cica -Care scar pad and elbow padding use   ? Person(s) Educated Patient   ? Methods Explanation;Demonstration;Tactile cues;Verbal cues;Handout   ? Comprehension Verbal cues required;Returned demonstration;Verbalized understanding   ? ?  ?  ? ?  ? ? ? OT Short Term Goals - 09/20/21 1520   ? ?  ? OT SHORT TERM GOAL #1  ? Title Pt to be independent in HEP to increase AROM in right elbow, wrist and hand  with less pain   ? Baseline Elbow extension -30, flexion 130, supination 75 with all pain  increased endrange.  Wrist extension decreased as well as flexion compared to September last year   ? Time 3   ? Period Weeks   ? Status New   ? Target Date 10/11/21   ? ?  ?  ? ?  ? ? ? ? OT Long Term Goals - 05/11

## 2021-09-26 ENCOUNTER — Other Ambulatory Visit: Payer: Self-pay | Admitting: Internal Medicine

## 2021-09-26 DIAGNOSIS — Z1231 Encounter for screening mammogram for malignant neoplasm of breast: Secondary | ICD-10-CM

## 2021-09-27 ENCOUNTER — Ambulatory Visit: Payer: Medicare Other | Admitting: Occupational Therapy

## 2021-09-27 DIAGNOSIS — L905 Scar conditions and fibrosis of skin: Secondary | ICD-10-CM

## 2021-09-27 DIAGNOSIS — M25521 Pain in right elbow: Secondary | ICD-10-CM

## 2021-09-27 DIAGNOSIS — M25531 Pain in right wrist: Secondary | ICD-10-CM

## 2021-09-27 DIAGNOSIS — M79641 Pain in right hand: Secondary | ICD-10-CM

## 2021-09-27 DIAGNOSIS — R600 Localized edema: Secondary | ICD-10-CM

## 2021-09-27 DIAGNOSIS — M6281 Muscle weakness (generalized): Secondary | ICD-10-CM

## 2021-09-27 DIAGNOSIS — M25631 Stiffness of right wrist, not elsewhere classified: Secondary | ICD-10-CM

## 2021-09-27 NOTE — Therapy (Signed)
[place] [facility] [address], [place], [address] Phone: [phone]   Fax:  [phone]  Occupational Therapy Treatment  Patient Details  Name: Teresa Lin MRN: [id number] Date of Birth: 1932/05/07 Referring Provider (OT): Dr Marcelino Scot   Encounter Date: 09/27/2021   OT End of Session - 09/27/21 1635     Visit Number 3    Number of Visits 16    Date for OT Re-Evaluation 11/15/21    OT Start Time 1402    OT Stop Time 1450    OT Time Calculation (min) 48 min    Activity Tolerance Patient tolerated treatment well    Behavior During Therapy [facility] for tasks assessed/performed             Past Medical History:  Diagnosis Date   Anemia    Anxiety    Arthritis    osteoarthritis. Bilateral knee replacement, hands, meniscal tear, cervical disc disease   Atherosclerosis    Atypical chest pain    Collagen vascular disease (HCC)    Colon polyp 12/15/2017   Colon polyps    Compression fracture of L2 (HCC)    Diverticulosis    DVT (deep venous thrombosis) (HCC)    Fracture of one rib, right side, subsequent encounter for fracture with routine healing 03/08/2017   GERD (gastroesophageal reflux disease)    History of seronegative inflammatory arthritis    HOH (hard of hearing)    no hearing aids   Hypertension    IBS (irritable bowel syndrome)    Internal hemorrhoids    Phlebitis and thrombophlebitis of deep veins of lower extremities ([place])    after surgery   Vision abnormalities     Past Surgical History:  Procedure Laterality Date   APPENDECTOMY     COLONOSCOPY N/A 01/24/2020   Procedure: COLONOSCOPY;  Surgeon: Lesly Rubenstein, MD;  Location: [facility] ENDOSCOPY;  Service: Endoscopy;  Laterality: N/A;   COLONOSCOPY WITH PROPOFOL N/A 10/24/2017   Procedure: COLONOSCOPY WITH PROPOFOL;  Surgeon: Manya Silvas, MD;  Location: [facility] ENDOSCOPY;  Service: Endoscopy;  Laterality: N/A;   COLONOSCOPY WITH PROPOFOL  N/A 11/21/2017   Procedure: COLONOSCOPY WITH PROPOFOL;  Surgeon: Manya Silvas, MD;  Location: [facility] ENDOSCOPY;  Service: Endoscopy;  Laterality: N/A;   DILATION AND CURETTAGE OF UTERUS     ESOPHAGOGASTRODUODENOSCOPY (EGD) WITH PROPOFOL N/A 02/25/2018   Procedure: ESOPHAGOGASTRODUODENOSCOPY (EGD) WITH PROPOFOL;  Surgeon: Manya Silvas, MD;  Location: [facility] ENDOSCOPY;  Service: Endoscopy;  Laterality: N/A;   EYE SURGERY     bilateral catarACT   JOINT REPLACEMENT Bilateral    knee replacement   LAPAROSCOPIC RIGHT COLECTOMY Right 12/15/2017   Procedure: LAPAROSCOPIC RIGHT COLECTOMY;  Surgeon: Robert Bellow, MD;  Location: [facility] ORS;  Service: General;  Laterality: Right;   ORIF HUMERUS FRACTURE Right 08/09/2021   Procedure: IRRIGATION AND DEBRIDEMENT; OPEN REDUCTION INTERNAL FIXATION (ORIF) DISTAL HUMERUS FRACTURE;  Surgeon: Altamese Polk, MD;  Location: [place];  Service: Orthopedics;  Laterality: Right;   REPLACEMENT TOTAL KNEE BILATERAL     TOTAL SHOULDER ARTHROPLASTY Right 09/24/2018   Procedure: TOTAL SHOULDER ARTHROPLASTY - REVERSE RIGHT;  Surgeon: Corky Mull, MD;  Location: [facility] ORS;  Service: Orthopedics;  Laterality: Right;    There were no vitals filed for this visit.   Subjective Assessment - 09/27/21 1633     Subjective  You need to review some of the home program again I think I am doing it right  but my elbow stays sore - I do not know if I am been doing the bending 1 correctly.  I am seeing the rheumatologist on Monday.    Pertinent History Pt is an 88 y.o. female admitted 08/08/21 after fall sustaining open R supracondylar distal humerus fx. S/p R humeral ORIF, ulnar nerve neuroplasty 3/30 by Dr Marcelino Scot. PMH includes HTN, IBS, DVT/PE, arthritis, anxiety, HOH, multiple joint replacements. Pt was in hospital recieving OT and PT followed by [facility]. Pt refer to OT with increase pain and swelling in R hand and wrist after fall. Had to stop her arthritis medication while when she had to  had surgery.    Patient Stated Goals I am right-handed so I really need my elbow, wrist and hand.  I want to get my motion and strength better in my right arm and hand, wrist so that I can do the things around the house that I need to do.    Currently in Pain? Yes   Soreness or tenderness over posterior elbow but no number provided               [facility] OT Assessment - 09/27/21 0001       AROM   Right Elbow Flexion -25    Right Elbow Extension 135    Right Forearm Supination 80 Degrees    Right Wrist Extension 30 Degrees    Right Wrist Flexion 40 Degrees    Right Wrist Ulnar Deviation 25 Degrees      Right Hand AROM   R Index  MCP 0-90 80 Degrees    R Index PIP 0-100 80 Degrees    R Long  MCP 0-90 90 Degrees                      OT Treatments/Exercises (OP) - 09/27/21 0001       RUE Paraffin   Number Minutes Paraffin 8 Minutes    RUE Paraffin Location --   Right elbow and hand wrist prior to soft tissue motion   Comments Prior to passive range of motion and soft tissue              Patient to continue with heating pad for elbow extension stretch.  Patient to support on pillow use heating pad with a light bag of beans or rice that is about less than a pound for his gentle stretch.  Prior to range of motion. Cica -Care scar pad provided and reviewed with patient to use at nighttime and educated on use.  Provided with extra padding for elbow pad with padding for patient to use during the day for propping up the elbow as well as at nighttime for cushioning to decrease pain and tenderness as well as swelling to posterior right elbow.                                Reviewed with patient again home exercises upon request for reviewing and patient needed min assist to mod assist with some.   Interlocking active assisted range of motion for elbow flexion to neck or ear on both sides 10 reps.  10 reps hold for 10 seconds   Elbow extension with support on  pillow doing active assisted range of motion with light passive range of motion for elbow extension and palm up.  10 reps hold 5 seconds each Elbow to side passive range of motion and active  assisted range of motion for supination pronation 10 reps.  Was able to stay to get full supination with elbow extended and then facilitated elbow flexion and supination and the elbow extension.  Followed by 1 pound weight for supination pronation with elbow flexion at 90 Wrist extension and flexion over armrest 10 reps.  Patient can assist as needed for extension and doing 10 reps of 1 pound weight.   Patient continued to same home program for opposition fisting ,overhead active range of motion for shoulder. Reviewed again use of Isotoner glove mostly at nighttime.  If is too tight patient can stop wearing it but encouraged him to use her CMC neoprene during the day for thumb and radial wrist pain    Scar massage by OT and mini massager used this date over posterior scar patient tender especially with the middle part over the posterior elbow but making progress -done mostly manual therapy by OT or soft tissue massage prior to elbow extension and flexion done in supine elbow stretch extension as well as punching up to ceiling with active assisted range of motion and cueing 1 pound weight.            OT Education - 09/27/21 1635     Education Details Progress and review of home exercises as well as scar massage, Cica -Care scar pad and elbow padding use    Person(s) Educated Patient    Methods Explanation;Demonstration;Tactile cues;Verbal cues;Handout    Comprehension Verbal cues required;Returned demonstration;Verbalized understanding              OT Short Term Goals - 09/20/21 1520       OT SHORT TERM GOAL #1   Title Pt to be independent in HEP to increase AROM in right elbow, wrist and hand  with less pain    Baseline Elbow extension -30, flexion 130, supination 75 with all pain increased  endrange.  Wrist extension decreased as well as flexion compared to September last year    Time 3    Period Weeks    Status New    Target Date 10/11/21               OT Long Term Goals - 09/20/21 1521       OT LONG TERM GOAL #1   Title Right elbow flexion increased for patient to be able to put her hearing aids in and fix her hair with right dominant hand with pain less than 3/10    Baseline Patient report able to start eating a little bit with the right hand but limited with supination 75 degrees, elbow flexion 130 with pain    Time 4    Period Weeks    Status New    Target Date 10/18/21      OT LONG TERM GOAL #2   Title Right elbow extension increased with more than 15 degrees for patient to be able to reach in pocket and shelves without pain.    Baseline Elbow extension -30 degrees, supination 75 with pain at all end ranges.    Time 5    Period Weeks    Status New    Target Date 10/25/21      OT LONG TERM GOAL #3   Title Right elbow flexion extension increased for patient to be able to push heavy door open, turn a doorknob and carrying more than 5 pounds without increase symptoms    Baseline Patient 6-1/2 weeks postop pain with end range at -34 extension,  130 for flexion and supination at 75.  Pain and tenderness with swelling at wrist limited in wrist flexion extension.    Time 8    Period Weeks    Status New    Target Date 11/15/21      OT LONG TERM GOAL #4   Title Patient's pain, swelling and active range of motion increased to same than last year.    Baseline Right wrist extension and flexion decreased compared to September of last year as well as grip and prehension strength patient only 6-1/2 weeks postop.  Patient with pain with touch and tenderness over radial wrist and hand.    Time 8    Period Weeks    Status New    Target Date 11/15/21                   Plan - 09/27/21 1636     Clinical Impression Statement Patient presented OT evaluation  with a diagnosis of her right elbow supracondylar fracture with ORIF on 08/09/2021 after a fall.  Patient also has a flareup of her rheumatoid arthritis symptoms in her right hand and wrist because she had to stop her medication during surgery.  Patient at evaluation with scar adhesion at posterior elbow limiting her motion as well as increased swelling at elbow, wrist mostly over radial wrist as well as hand.  Patient reported increased pain and tenderness but no number provided.  Patient was seen by this OT in 2020 and 2021 and compared to those measurements patient did lose wrist active range of motion as well as some digit range of motion.  This date compared to evaluation last week patient's wrist active range of motion and digit range of motion improved since then.  Patient to continue to wear [facility] neoprene splint during the day for pain at the thumb [facility] and wrist..  Paraffin done to elbow and hand this date and able to tolerate a little bit more motion from OT for wrist, forearm supination and elbow extension/flexion with some improvement since start of care.  Patient needed review of home exercises again and new handout provided.  Scar massage done but patient with pain with scar massage, held off teaching her that yet.  Cica -Care scar pad provided last time and provided extra padding for elbow pad for cushioning for posterior elbow to decrease tenderness and pain.  Patient is right-hand dominant and present with increased scar tissue, increase edema and pain with limited range of motion and decreased strength in right dominant arm limiting her functional use of right hand and ADLs and IADLs.  Patient can benefit from OT services to increase use of right dominant hand.    OT Occupational Profile and History Problem Focused Assessment - Including review of records relating to presenting problem    Occupational performance deficits (Please refer to evaluation for details): ADL's;IADL's;Play;Leisure;Social  Participation    Body Structure / Function / Physical Skills ADL;Strength;Decreased knowledge of use of DME;Decreased knowledge of precautions;Flexibility;Scar mobility;ROM;IADL;Edema;UE functional use;Pain;Dexterity    Rehab Potential Good    Clinical Decision Making Limited treatment options, no task modification necessary    Comorbidities Affecting Occupational Performance: None    Modification or Assistance to Complete Evaluation  Min-Moderate modification of tasks or assist with assess necessary to complete eval    OT Frequency 2x / week    OT Duration 8 weeks    OT Treatment/Interventions Self-care/ADL training;Splinting;Therapeutic exercise;Patient/family education;DME and/or AE instruction;Contrast Bath;Scar mobilization;Cryotherapy;Paraffin;Manual Therapy;Passive range of motion  Consulted and Agree with Plan of Care Patient             Patient will benefit from skilled therapeutic intervention in order to improve the following deficits and impairments:   Body Structure / Function / Physical Skills: ADL, Strength, Decreased knowledge of use of DME, Decreased knowledge of precautions, Flexibility, Scar mobility, ROM, IADL, Edema, UE functional use, Pain, Dexterity       Visit Diagnosis: Pain in right elbow  Scar condition and fibrosis of skin  Localized edema  Pain in right wrist  Stiffness of right wrist, not elsewhere classified  Muscle weakness (generalized)  Pain in right hand    Problem List Patient Active Problem List   Diagnosis Date Noted   Normocytic anemia 08/11/2021   Hypokalemia 08/09/2021   Displaced comminuted supracondylar fracture of right humerus without intercondylar fracture 08/08/2021   Open fracture of right humerus 08/08/2021   History of DVT (deep vein thrombosis) 06/06/2021   Rheumatoid arthritis of multiple sites without rheumatoid factor ([place]) 04/24/2021   History of pulmonary embolism 02/04/2020   History of blood clotting  disorder 01/12/2020   Immunosuppression ([place]) 01/25/2019   Status post reverse total shoulder replacement, right 09/24/2018   Hyperglycemia 08/31/2018   S/P right colectomy 01/26/2018   Adenomatous polyp of ascending colon 11/24/2017   Generalized anxiety disorder 09/06/2017   Irritable bowel syndrome (IBS) 09/06/2017   Orthostasis 06/05/2016   Left shoulder pain 10/13/2015   Constipation 05/30/2015   Osteoporosis, postmenopausal 05/30/2015   Compression fracture of L2 lumbar vertebra with delayed healing 05/29/2015   Frequent falls 03/26/2015   Atherosclerosis of arteries 03/26/2015   Family history of colon cancer 09/28/2014   TMJ (temporomandibular joint syndrome) 06/12/2014   Varicose veins of both lower extremities without ulcer or inflammation 06/12/2014   B12 deficiency 09/20/2013   Fatigue 05/23/2013   Internal hemorrhoids without complication 21/30/8657   Insomnia 03/03/2013   Vertigo due to cerebrovascular disease 03/03/2013   Seronegative rheumatoid arthritis affecting lower leg (HCC) 03/03/2013    Rosalyn Gess, OTR/L,CLT 09/27/2021, 4:40 PM  [place] [facility] [address], [place], [address] Phone: [phone]   Fax:  [phone]  Name: Teresa Lin MRN: [id number] Date of Birth: [birth year]

## 2021-10-02 ENCOUNTER — Ambulatory Visit: Payer: Medicare Other | Admitting: Occupational Therapy

## 2021-10-02 DIAGNOSIS — M25531 Pain in right wrist: Secondary | ICD-10-CM

## 2021-10-02 DIAGNOSIS — M79641 Pain in right hand: Secondary | ICD-10-CM

## 2021-10-02 DIAGNOSIS — M25521 Pain in right elbow: Secondary | ICD-10-CM | POA: Diagnosis not present

## 2021-10-02 DIAGNOSIS — M6281 Muscle weakness (generalized): Secondary | ICD-10-CM

## 2021-10-02 DIAGNOSIS — R600 Localized edema: Secondary | ICD-10-CM

## 2021-10-02 DIAGNOSIS — M25631 Stiffness of right wrist, not elsewhere classified: Secondary | ICD-10-CM

## 2021-10-02 DIAGNOSIS — L905 Scar conditions and fibrosis of skin: Secondary | ICD-10-CM

## 2021-10-02 NOTE — Therapy (Signed)
[facility] [address] [place], [address] Phone: [phone]   Fax:  [phone]  Occupational Therapy Treatment  Patient Details  Name: Teresa Lin MRN: [id number] Date of Birth: Aug 09, 1931 Referring Provider (OT): Dr Marcelino Scot   Encounter Date: 10/02/2021   OT End of Session - 10/02/21 1231     Visit Number 4    Number of Visits 16    Date for OT Re-Evaluation 11/15/21    OT Start Time 1117    OT Stop Time 1219    OT Time Calculation (min) 62 min    Activity Tolerance Patient tolerated treatment well    Behavior During Therapy [facility] for tasks assessed/performed             Past Medical History:  Diagnosis Date   Anemia    Anxiety    Arthritis    osteoarthritis. Bilateral knee replacement, hands, meniscal tear, cervical disc disease   Atherosclerosis    Atypical chest pain    Collagen vascular disease (HCC)    Colon polyp 12/15/2017   Colon polyps    Compression fracture of L2 (HCC)    Diverticulosis    DVT (deep venous thrombosis) (HCC)    Fracture of one rib, right side, subsequent encounter for fracture with routine healing 03/08/2017   GERD (gastroesophageal reflux disease)    History of seronegative inflammatory arthritis    HOH (hard of hearing)    no hearing aids   Hypertension    IBS (irritable bowel syndrome)    Internal hemorrhoids    Phlebitis and thrombophlebitis of deep veins of lower extremities ([place])    after surgery   Vision abnormalities     Past Surgical History:  Procedure Laterality Date   APPENDECTOMY     COLONOSCOPY N/A 01/24/2020   Procedure: COLONOSCOPY;  Surgeon: Lesly Rubenstein, MD;  Location: [facility] ENDOSCOPY;  Service: Endoscopy;  Laterality: N/A;   COLONOSCOPY WITH PROPOFOL N/A 10/24/2017   Procedure: COLONOSCOPY WITH PROPOFOL;  Surgeon: Manya Silvas, MD;  Location: [facility] ENDOSCOPY;  Service: Endoscopy;  Laterality: N/A;   COLONOSCOPY WITH PROPOFOL  N/A 11/21/2017   Procedure: COLONOSCOPY WITH PROPOFOL;  Surgeon: Manya Silvas, MD;  Location: [facility] ENDOSCOPY;  Service: Endoscopy;  Laterality: N/A;   DILATION AND CURETTAGE OF UTERUS     ESOPHAGOGASTRODUODENOSCOPY (EGD) WITH PROPOFOL N/A 02/25/2018   Procedure: ESOPHAGOGASTRODUODENOSCOPY (EGD) WITH PROPOFOL;  Surgeon: Manya Silvas, MD;  Location: [facility] ENDOSCOPY;  Service: Endoscopy;  Laterality: N/A;   EYE SURGERY     bilateral catarACT   JOINT REPLACEMENT Bilateral    knee replacement   LAPAROSCOPIC RIGHT COLECTOMY Right 12/15/2017   Procedure: LAPAROSCOPIC RIGHT COLECTOMY;  Surgeon: Robert Bellow, MD;  Location: [facility] ORS;  Service: General;  Laterality: Right;   ORIF HUMERUS FRACTURE Right 08/09/2021   Procedure: IRRIGATION AND DEBRIDEMENT; OPEN REDUCTION INTERNAL FIXATION (ORIF) DISTAL HUMERUS FRACTURE;  Surgeon: Altamese Albion, MD;  Location: [place];  Service: Orthopedics;  Laterality: Right;   REPLACEMENT TOTAL KNEE BILATERAL     TOTAL SHOULDER ARTHROPLASTY Right 09/24/2018   Procedure: TOTAL SHOULDER ARTHROPLASTY - REVERSE RIGHT;  Surgeon: Corky Mull, MD;  Location: [facility] ORS;  Service: Orthopedics;  Laterality: Right;    There were no vitals filed for this visit.   Subjective Assessment - 10/02/21 1229     Subjective  I seen Dr. Posey Pronto yesterday and he recommended for me to go on steroid again but I  do not want to.  The arthritis medication should be in my system now.  I wrist and elbow just hurts really bad    Pertinent History Pt is an 88 y.o. female admitted 08/08/21 after fall sustaining open R supracondylar distal humerus fx. S/p R humeral ORIF, ulnar nerve neuroplasty 3/30 by Dr Marcelino Scot. PMH includes HTN, IBS, DVT/PE, arthritis, anxiety, HOH, multiple joint replacements. Pt was in hospital recieving OT and PT followed by [facility]. Pt refer to OT with increase pain and swelling in R hand and wrist after fall. Had to stop her arthritis medication while when she had to had surgery.     Patient Stated Goals I am right-handed so I really need my elbow, wrist and hand.  I want to get my motion and strength better in my right arm and hand, wrist so that I can do the things around the house that I need to do.    Currently in Pain? Yes   No number stated right wrist and elbow               [facility] OT Assessment - 10/02/21 0001       AROM   Right Elbow Flexion -20   in session   Right Elbow Extension 135    Right Forearm Supination 85 Degrees   in session   Right Wrist Extension 30 Degrees   in session   Right Wrist Flexion 40 Degrees    Right Wrist Ulnar Deviation 25 Degrees                      OT Treatments/Exercises (OP) - 10/02/21 0001       RUE Paraffin   Number Minutes Paraffin 8 Minutes    RUE Paraffin Location Hand   R elbow   Comments Prior to passive range of motion and soft tissue with supination and elbow extension stretch with heating pad                Patient to continue with heating pad for elbow extension stretch.  Patient to support on pillow use heating pad with a light bag of beans or rice that is about less than a pound for his gentle stretch.  Prior to range of motion. Cica -Care scar pad provided and reviewed with patient to use at nighttime and educated on use last visit..  Patient to continue with elbow pad with padding for patient to use during the day for propping up the elbow as well as at nighttime for cushioning to decrease pain and tenderness as well as swelling to posterior right elbow.                                   Reviewed with patient again home exercises upon request for reviewing and patient needed min assist to mod assist with some.  Patient to hold wrist during active assisted range of motion for elbow flexion to neck or ear on both sides 10 reps.  10 reps hold for 10 seconds   Elbow extension with support on pillow doing active assisted range of motion with light passive range of motion  for elbow extension and palm up.  10 reps hold 5 seconds each Elbow to side passive range of motion and active assisted range of motion for supination pronation 10 reps.  Was able to stay to get full supination with elbow extended and then facilitated  elbow flexion and supination and the elbow extension.  Followed by 1 pound weight for supination pronation with elbow flexion at 90 Change patient's home program for patient to do in supine wrist extension stretch by holding with left arm at right wrist given light traction 10 reps. Followed by 1 pound weight or 16 ounce hammer for patient to palms up to ceiling in supine 10 reps. Change wrist extension to active assisted range of motion over edge of table moving elbow on wrist-patient able to increase 10 degrees for wrist extension-10 reps.  Wrist extension and flexion over armrest 10 reps.   Patient continued to same home program for opposition ,fisting , Reviewed again use of Isotoner glove mostly at nighttime.  If is too tight patient can stop wearing it but encouraged him to use her CMC neoprene during the day for thumb and radial wrist pain     Scar massage by OT and mini massager used this date over posterior scar patient tender especially with the middle part over the posterior elbow but making progress -done mostly manual therapy by OT or soft tissue massage prior to elbow extension and flexion  Applied Kinesiotape this date with 2 strips parallel to scar 30% pull With 3 across 90 degrees to 100% pull patient to keep on until next visit patient educated on signs for allergic reaction and can remove.           OT Education - 10/02/21 1231     Education Details Progress and review of home exercises as well as scar massage, Cica -Care scar pad and elbow padding use    Person(s) Educated Patient    Methods Explanation;Demonstration;Tactile cues;Verbal cues;Handout    Comprehension Verbal cues required;Returned demonstration;Verbalized  understanding              OT Short Term Goals - 09/20/21 1520       OT SHORT TERM GOAL #1   Title Pt to be independent in HEP to increase AROM in right elbow, wrist and hand  with less pain    Baseline Elbow extension -30, flexion 130, supination 75 with all pain increased endrange.  Wrist extension decreased as well as flexion compared to September last year    Time 3    Period Weeks    Status New    Target Date 10/11/21               OT Long Term Goals - 09/20/21 1521       OT LONG TERM GOAL #1   Title Right elbow flexion increased for patient to be able to put her hearing aids in and fix her hair with right dominant hand with pain less than 3/10    Baseline Patient report able to start eating a little bit with the right hand but limited with supination 75 degrees, elbow flexion 130 with pain    Time 4    Period Weeks    Status New    Target Date 10/18/21      OT LONG TERM GOAL #2   Title Right elbow extension increased with more than 15 degrees for patient to be able to reach in pocket and shelves without pain.    Baseline Elbow extension -30 degrees, supination 75 with pain at all end ranges.    Time 5    Period Weeks    Status New    Target Date 10/25/21      OT LONG TERM GOAL #3   Title Right  elbow flexion extension increased for patient to be able to push heavy door open, turn a doorknob and carrying more than 5 pounds without increase symptoms    Baseline Patient 6-1/2 weeks postop pain with end range at -34 extension, 130 for flexion and supination at 75.  Pain and tenderness with swelling at wrist limited in wrist flexion extension.    Time 8    Period Weeks    Status New    Target Date 11/15/21      OT LONG TERM GOAL #4   Title Patient's pain, swelling and active range of motion increased to same than last year.    Baseline Right wrist extension and flexion decreased compared to September of last year as well as grip and prehension strength patient  only 6-1/2 weeks postop.  Patient with pain with touch and tenderness over radial wrist and hand.    Time 8    Period Weeks    Status New    Target Date 11/15/21                   Plan - 10/02/21 1232     Clinical Impression Statement Patient presented OT evaluation with a diagnosis of her right elbow supracondylar fracture with ORIF on 08/09/2021 after a fall.  Patient also has a flareup of her rheumatoid arthritis symptoms in her right hand and wrist because she had to stop her medication during surgery.  Patient at evaluation with scar adhesion at posterior elbow limiting her motion as well as increased swelling at elbow, wrist mostly over radial wrist as well as hand.  Patient reported increased pain and tenderness but no number provided.  Patient was seen by this OT in 2020 and 2021 and compared to those measurements patient did lose wrist active range of motion as well as some digit range of motion.  This date compared to evaluation last week patient's wrist active range of motion and digits range of motion improved since evaluation.  Remind patient again to  wear CMC neoprene splint during the day for pain at the thumb [facility] and wrist..  Paraffin done to elbow and hand prior to range of motion and soft tissue to right wrist, forearm supination and elbow extension/flexion with some improvement since start of care.  Patient needed review of home exercises again and reviewed and updated handout provided.  Scar massage done but patient with pain with scar massage, patient education was done as well as Kinesiotape was applied today for scar mobilization during the day with motion patient to wear elbow pad for cushioning for posterior elbow to decrease tenderness and pain.  Patient is right-hand dominant and present with increased scar tissue, increase edema and pain with limited range of motion and decreased strength in right dominant arm limiting her functional use of right hand and ADLs and  IADLs.  Patient can benefit from OT services to increase use of right dominant hand.    OT Occupational Profile and History Problem Focused Assessment - Including review of records relating to presenting problem    Occupational performance deficits (Please refer to evaluation for details): ADL's;IADL's;Play;Leisure;Social Participation    Body Structure / Function / Physical Skills ADL;Strength;Decreased knowledge of use of DME;Decreased knowledge of precautions;Flexibility;Scar mobility;ROM;IADL;Edema;UE functional use;Pain;Dexterity    Rehab Potential Good    Clinical Decision Making Limited treatment options, no task modification necessary    Comorbidities Affecting Occupational Performance: None    Modification or Assistance to Complete Evaluation  Min-Moderate modification of  tasks or assist with assess necessary to complete eval    OT Frequency 2x / week    OT Duration 8 weeks    OT Treatment/Interventions Self-care/ADL training;Splinting;Therapeutic exercise;Patient/family education;DME and/or AE instruction;Contrast Bath;Scar mobilization;Cryotherapy;Paraffin;Manual Therapy;Passive range of motion    Consulted and Agree with Plan of Care Patient             Patient will benefit from skilled therapeutic intervention in order to improve the following deficits and impairments:   Body Structure / Function / Physical Skills: ADL, Strength, Decreased knowledge of use of DME, Decreased knowledge of precautions, Flexibility, Scar mobility, ROM, IADL, Edema, UE functional use, Pain, Dexterity       Visit Diagnosis: Pain in right elbow  Scar condition and fibrosis of skin  Localized edema  Pain in right wrist  Stiffness of right wrist, not elsewhere classified  Muscle weakness (generalized)  Pain in right hand    Problem List Patient Active Problem List   Diagnosis Date Noted   Normocytic anemia 08/11/2021   Hypokalemia 08/09/2021   Displaced comminuted supracondylar  fracture of right humerus without intercondylar fracture 08/08/2021   Open fracture of right humerus 08/08/2021   History of DVT (deep vein thrombosis) 06/06/2021   Rheumatoid arthritis of multiple sites without rheumatoid factor ([place]) 04/24/2021   History of pulmonary embolism 02/04/2020   History of blood clotting disorder 01/12/2020   Immunosuppression ([place]) 01/25/2019   Status post reverse total shoulder replacement, right 09/24/2018   Hyperglycemia 08/31/2018   S/P right colectomy 01/26/2018   Adenomatous polyp of ascending colon 11/24/2017   Generalized anxiety disorder 09/06/2017   Irritable bowel syndrome (IBS) 09/06/2017   Orthostasis 06/05/2016   Left shoulder pain 10/13/2015   Constipation 05/30/2015   Osteoporosis, postmenopausal 05/30/2015   Compression fracture of L2 lumbar vertebra with delayed healing 05/29/2015   Frequent falls 03/26/2015   Atherosclerosis of arteries 03/26/2015   Family history of colon cancer 09/28/2014   TMJ (temporomandibular joint syndrome) 06/12/2014   Varicose veins of both lower extremities without ulcer or inflammation 06/12/2014   B12 deficiency 09/20/2013   Fatigue 05/23/2013   Internal hemorrhoids without complication 60/63/0160   Insomnia 03/03/2013   Vertigo due to cerebrovascular disease 03/03/2013   Seronegative rheumatoid arthritis affecting lower leg (HCC) 03/03/2013    Rosalyn Gess, OTR/L,CLT 10/02/2021, 12:36 PM  [facility] [address] [place], [address] Phone: [phone]   Fax:  [phone]  Name: Teresa Lin MRN: [id number] Date of Birth: Jul 20, 1931

## 2021-10-04 ENCOUNTER — Ambulatory Visit: Payer: Medicare Other | Admitting: Occupational Therapy

## 2021-10-04 DIAGNOSIS — M25521 Pain in right elbow: Secondary | ICD-10-CM | POA: Diagnosis not present

## 2021-10-04 DIAGNOSIS — L905 Scar conditions and fibrosis of skin: Secondary | ICD-10-CM

## 2021-10-04 DIAGNOSIS — M25531 Pain in right wrist: Secondary | ICD-10-CM

## 2021-10-04 DIAGNOSIS — M6281 Muscle weakness (generalized): Secondary | ICD-10-CM

## 2021-10-04 DIAGNOSIS — M25631 Stiffness of right wrist, not elsewhere classified: Secondary | ICD-10-CM

## 2021-10-04 DIAGNOSIS — R600 Localized edema: Secondary | ICD-10-CM

## 2021-10-04 NOTE — Therapy (Signed)
[facility] [address] [place], [address] Phone: [phone]   Fax:  [phone]  Occupational Therapy Treatment  Patient Details  Name: Teresa Lin MRN: [id number] Date of Birth: 06/07/1931 Referring Provider (OT): Dr Marcelino Scot   Encounter Date: 10/04/2021   OT End of Session - 10/04/21 1124     Visit Number 5    Number of Visits 16    Date for OT Re-Evaluation 11/15/21    OT Start Time 1125    OT Stop Time 1204    OT Time Calculation (min) 39 min    Activity Tolerance Patient tolerated treatment well    Behavior During Therapy [facility] for tasks assessed/performed             Past Medical History:  Diagnosis Date   Anemia    Anxiety    Arthritis    osteoarthritis. Bilateral knee replacement, hands, meniscal tear, cervical disc disease   Atherosclerosis    Atypical chest pain    Collagen vascular disease (HCC)    Colon polyp 12/15/2017   Colon polyps    Compression fracture of L2 (HCC)    Diverticulosis    DVT (deep venous thrombosis) (HCC)    Fracture of one rib, right side, subsequent encounter for fracture with routine healing 03/08/2017   GERD (gastroesophageal reflux disease)    History of seronegative inflammatory arthritis    HOH (hard of hearing)    no hearing aids   Hypertension    IBS (irritable bowel syndrome)    Internal hemorrhoids    Phlebitis and thrombophlebitis of deep veins of lower extremities ([place])    after surgery   Vision abnormalities     Past Surgical History:  Procedure Laterality Date   APPENDECTOMY     COLONOSCOPY N/A 01/24/2020   Procedure: COLONOSCOPY;  Surgeon: Lesly Rubenstein, MD;  Location: [facility] ENDOSCOPY;  Service: Endoscopy;  Laterality: N/A;   COLONOSCOPY WITH PROPOFOL N/A 10/24/2017   Procedure: COLONOSCOPY WITH PROPOFOL;  Surgeon: Manya Silvas, MD;  Location: [facility] ENDOSCOPY;  Service: Endoscopy;  Laterality: N/A;   COLONOSCOPY WITH PROPOFOL  N/A 11/21/2017   Procedure: COLONOSCOPY WITH PROPOFOL;  Surgeon: Manya Silvas, MD;  Location: [facility] ENDOSCOPY;  Service: Endoscopy;  Laterality: N/A;   DILATION AND CURETTAGE OF UTERUS     ESOPHAGOGASTRODUODENOSCOPY (EGD) WITH PROPOFOL N/A 02/25/2018   Procedure: ESOPHAGOGASTRODUODENOSCOPY (EGD) WITH PROPOFOL;  Surgeon: Manya Silvas, MD;  Location: [facility] ENDOSCOPY;  Service: Endoscopy;  Laterality: N/A;   EYE SURGERY     bilateral catarACT   JOINT REPLACEMENT Bilateral    knee replacement   LAPAROSCOPIC RIGHT COLECTOMY Right 12/15/2017   Procedure: LAPAROSCOPIC RIGHT COLECTOMY;  Surgeon: Robert Bellow, MD;  Location: [facility] ORS;  Service: General;  Laterality: Right;   ORIF HUMERUS FRACTURE Right 08/09/2021   Procedure: IRRIGATION AND DEBRIDEMENT; OPEN REDUCTION INTERNAL FIXATION (ORIF) DISTAL HUMERUS FRACTURE;  Surgeon: Altamese Dansville, MD;  Location: [place];  Service: Orthopedics;  Laterality: Right;   REPLACEMENT TOTAL KNEE BILATERAL     TOTAL SHOULDER ARTHROPLASTY Right 09/24/2018   Procedure: TOTAL SHOULDER ARTHROPLASTY - REVERSE RIGHT;  Surgeon: Corky Mull, MD;  Location: [facility] ORS;  Service: Orthopedics;  Laterality: Right;    There were no vitals filed for this visit.   Subjective Assessment - 10/04/21 1124     Subjective  My wrist and hand feels better- the elbow sore but it is probably from the exercises  Pertinent History Pt is an 88 y.o. female admitted 08/08/21 after fall sustaining open R supracondylar distal humerus fx. S/p R humeral ORIF, ulnar nerve neuroplasty 3/30 by Dr Marcelino Scot. PMH includes HTN, IBS, DVT/PE, arthritis, anxiety, HOH, multiple joint replacements. Pt was in hospital recieving OT and PT followed by [facility] Pt refer to OT with increase pain and swelling in R hand and wrist after fall. Had to stop her arthritis medication while when she had to had surgery.    Patient Stated Goals I am right-handed so I really need my elbow, wrist and hand.  I want to get my  motion and strength better in my right arm and hand, wrist so that I can do the things around the house that I need to do.    Currently in Pain? Yes   no number   Pain Location Elbow    Pain Orientation Right    Pain Descriptors / Indicators Tender;Aching    Pain Type Surgical pain              Patient arrived with Kinesiotape still in place on posterior elbow scar.  Removed patient showed decrease edema increase scar tissue mobilization compared to last time as well as decrease edema and pain over wrist.  Patient continues to show improvement in wrist and digit active range of motion.              OT Treatments/Exercises (OP) - 10/04/21 0001       RUE Paraffin   Number Minutes Paraffin 8 Minutes    RUE Paraffin Location Hand   wrist and elbow   Comments stretch for elbow extention and heatingpad              Cica -Care scar pad provided in the past and reviewed with patient to use at nighttime and educated on use last visit..  Patient to continue with elbow pad with padding for patient to use during the day for propping up the elbow as well as at nighttime for cushioning to decrease pain and tenderness as well as swelling to posterior right elbow.                                     Patient to hold wrist during active assisted range of motion for elbow flexion to neck or ear on both sides 10 reps.  10 reps hold for 10 seconds   Elbow extension with support on pillow doing active assisted range of motion with light passive range of motion for elbow extension and palm up.  10 reps hold 5 seconds each Elbow to side passive range of motion and active assisted range of motion for supination pronation 10 reps.  Was able to stay to get full supination with elbow extended and then facilitated elbow flexion and supination and the elbow extension.  Followed by 1 pound weight for supination pronation with elbow flexion at 90 1 pound weight for elbow extension with  shoulder in 90 degrees flexion-elbow flexion down to ear and up to ceiling. Followed by yellow band wrapped around 1 pound weight for larger grip and hand stabilize at shoulder patient doing elbow extension with yellow Thera-Band 10 reps With both 1 pound and regular band OT assisted facilitating endrange elbow extension   Change patient's home program for patient to do in supine wrist extension stretch by holding with left arm at right wrist given light traction  10 reps. Followed by 1 pound weight or 16 ounce hammer for patient to palms up to ceiling in supine 10 reps. Change wrist extension to active assisted range of motion over edge of table moving elbow on wrist-patient able to increase 10 degrees for wrist extension-10 reps.   Wrist extension and flexion over armrest 10 reps.   Patient continued to same home program for opposition ,fisting , Reviewed again use of Isotoner glove mostly at nighttime.  If is too tight patient can stop wearing it but encouraged him to use her CMC neoprene during the day for thumb and radial wrist pain     Scar massage by OT and mini massager used this date over posterior scar patient tender especially with the middle part over the posterior elbow but making progress -done mostly manual therapy by OT or soft tissue massage prior to elbow extension and flexion              OT Education - 10/04/21 1522     Education Details Progress and review of home exercises as well as scar massage, Cica -Care scar pad and elbow padding use    Person(s) Educated Patient    Methods Explanation;Demonstration;Tactile cues;Verbal cues;Handout    Comprehension Verbal cues required;Returned demonstration;Verbalized understanding              OT Short Term Goals - 09/20/21 1520       OT SHORT TERM GOAL #1   Title Pt to be independent in HEP to increase AROM in right elbow, wrist and hand  with less pain    Baseline Elbow extension -30, flexion 130, supination 75  with all pain increased endrange.  Wrist extension decreased as well as flexion compared to September last year    Time 3    Period Weeks    Status New    Target Date 10/11/21               OT Long Term Goals - 09/20/21 1521       OT LONG TERM GOAL #1   Title Right elbow flexion increased for patient to be able to put her hearing aids in and fix her hair with right dominant hand with pain less than 3/10    Baseline Patient report able to start eating a little bit with the right hand but limited with supination 75 degrees, elbow flexion 130 with pain    Time 4    Period Weeks    Status New    Target Date 10/18/21      OT LONG TERM GOAL #2   Title Right elbow extension increased with more than 15 degrees for patient to be able to reach in pocket and shelves without pain.    Baseline Elbow extension -30 degrees, supination 75 with pain at all end ranges.    Time 5    Period Weeks    Status New    Target Date 10/25/21      OT LONG TERM GOAL #3   Title Right elbow flexion extension increased for patient to be able to push heavy door open, turn a doorknob and carrying more than 5 pounds without increase symptoms    Baseline Patient 6-1/2 weeks postop pain with end range at -34 extension, 130 for flexion and supination at 75.  Pain and tenderness with swelling at wrist limited in wrist flexion extension.    Time 8    Period Weeks    Status New    Target  Date 11/15/21      OT LONG TERM GOAL #4   Title Patient's pain, swelling and active range of motion increased to same than last year.    Baseline Right wrist extension and flexion decreased compared to September of last year as well as grip and prehension strength patient only 6-1/2 weeks postop.  Patient with pain with touch and tenderness over radial wrist and hand.    Time 8    Period Weeks    Status New    Target Date 11/15/21                   Plan - 10/04/21 1125     Clinical Impression Statement Patient  presented OT evaluation with a diagnosis of her right elbow supracondylar fracture with ORIF on 08/09/2021 after a fall.  Patient also has a flareup of her rheumatoid arthritis symptoms in her right hand and wrist because she had to stop her medication during surgery.  Patient at evaluation with scar adhesion at posterior elbow limiting her motion as well as increased swelling at elbow, wrist mostly over radial wrist as well as hand.  Patient reported increased pain and tenderness but no number provided.  Patient was seen by this OT in 2020 and 2021 and compared to those measurements patient did lose wrist active range of motion as well as some digit range of motion.  This date compared to earlier this week patient presented treatment with decrease edema over elbow and wrist with less pain in the hand and the wrist this date.  Was able to get increased elbow extension and supination in supine after paraffin.  Patient able to do 1 pound weight as well as yellow Thera-Band for elbow extension and show shoulder presses in supine.  Patient showing increased range of motion in digits, wrist as well as forearm and elbow.  Scar improved today also with kinesiotaping use the last 48 hours removed patient show no allergic reactions less swelling over the scar.  Remind patient again to continue wear CMC neoprene splint during the day for pain at the thumb [facility] and wrist..  Paraffin done to elbow and hand prior to range of motion and soft tissue to right wrist, forearm supination and elbow extension/flexion with some improvement since start of care.  Home program the same this day.  Scar massage done but patient with pain with scar massage. Patient is right-hand dominant and present with increased scar tissue, increase edema and pain with limited range of motion and decreased strength in right dominant arm limiting her functional use of right hand and ADLs and IADLs.  Patient can benefit from OT services to increase use of right  dominant hand.    OT Occupational Profile and History Problem Focused Assessment - Including review of records relating to presenting problem    Occupational performance deficits (Please refer to evaluation for details): ADL's;IADL's;Play;Leisure;Social Participation    Body Structure / Function / Physical Skills ADL;Strength;Decreased knowledge of use of DME;Decreased knowledge of precautions;Flexibility;Scar mobility;ROM;IADL;Edema;UE functional use;Pain;Dexterity    Rehab Potential Good    Clinical Decision Making Limited treatment options, no task modification necessary    Comorbidities Affecting Occupational Performance: None    Modification or Assistance to Complete Evaluation  Min-Moderate modification of tasks or assist with assess necessary to complete eval    OT Frequency 2x / week    OT Duration 8 weeks    OT Treatment/Interventions Self-care/ADL training;Splinting;Therapeutic exercise;Patient/family education;DME and/or AE instruction;Contrast Bath;Scar mobilization;Cryotherapy;Paraffin;Manual Therapy;Passive range  of motion    Consulted and Agree with Plan of Care Patient             Patient will benefit from skilled therapeutic intervention in order to improve the following deficits and impairments:   Body Structure / Function / Physical Skills: ADL, Strength, Decreased knowledge of use of DME, Decreased knowledge of precautions, Flexibility, Scar mobility, ROM, IADL, Edema, UE functional use, Pain, Dexterity       Visit Diagnosis: Pain in right elbow  Scar condition and fibrosis of skin  Localized edema  Pain in right wrist  Stiffness of right wrist, not elsewhere classified  Muscle weakness (generalized)    Problem List Patient Active Problem List   Diagnosis Date Noted   Normocytic anemia 08/11/2021   Hypokalemia 08/09/2021   Displaced comminuted supracondylar fracture of right humerus without intercondylar fracture 08/08/2021   Open fracture of right  humerus 08/08/2021   History of DVT (deep vein thrombosis) 06/06/2021   Rheumatoid arthritis of multiple sites without rheumatoid factor ([place]) 04/24/2021   History of pulmonary embolism 02/04/2020   History of blood clotting disorder 01/12/2020   Immunosuppression ([place]) 01/25/2019   Status post reverse total shoulder replacement, right 09/24/2018   Hyperglycemia 08/31/2018   S/P right colectomy 01/26/2018   Adenomatous polyp of ascending colon 11/24/2017   Generalized anxiety disorder 09/06/2017   Irritable bowel syndrome (IBS) 09/06/2017   Orthostasis 06/05/2016   Left shoulder pain 10/13/2015   Constipation 05/30/2015   Osteoporosis, postmenopausal 05/30/2015   Compression fracture of L2 lumbar vertebra with delayed healing 05/29/2015   Frequent falls 03/26/2015   Atherosclerosis of arteries 03/26/2015   Family history of colon cancer 09/28/2014   TMJ (temporomandibular joint syndrome) 06/12/2014   Varicose veins of both lower extremities without ulcer or inflammation 06/12/2014   B12 deficiency 09/20/2013   Fatigue 05/23/2013   Internal hemorrhoids without complication 68/04/7516   Insomnia 03/03/2013   Vertigo due to cerebrovascular disease 03/03/2013   Seronegative rheumatoid arthritis affecting lower leg ([place]) 03/03/2013    Rosalyn Gess, OTR/L,CLT 10/04/2021, 3:26 PM  [facility] [address], [place], [address] Phone: [phone]   Fax:  [phone]  Name: HAILEE HOLLICK MRN: [id number] Date of Birth: [birth year]

## 2021-10-10 ENCOUNTER — Ambulatory Visit: Payer: Medicare Other | Admitting: Occupational Therapy

## 2021-10-12 ENCOUNTER — Ambulatory Visit: Payer: Medicare Other | Attending: Orthopedic Surgery | Admitting: Occupational Therapy

## 2021-10-12 DIAGNOSIS — M6281 Muscle weakness (generalized): Secondary | ICD-10-CM

## 2021-10-12 DIAGNOSIS — M79641 Pain in right hand: Secondary | ICD-10-CM

## 2021-10-12 DIAGNOSIS — M25631 Stiffness of right wrist, not elsewhere classified: Secondary | ICD-10-CM

## 2021-10-12 DIAGNOSIS — R600 Localized edema: Secondary | ICD-10-CM

## 2021-10-12 DIAGNOSIS — L905 Scar conditions and fibrosis of skin: Secondary | ICD-10-CM

## 2021-10-12 DIAGNOSIS — M25521 Pain in right elbow: Secondary | ICD-10-CM | POA: Diagnosis present

## 2021-10-12 DIAGNOSIS — M25531 Pain in right wrist: Secondary | ICD-10-CM | POA: Diagnosis present

## 2021-10-12 NOTE — Therapy (Signed)
[facility] [address], [place], [address] Phone: [phone]   Fax:  [phone]  Occupational Therapy Treatment  Patient Details  Name: Teresa Lin MRN: [id number] Date of Birth: Jul 15, 1931 Referring Provider (OT): Dr Marcelino Scot   Encounter Date: 10/12/2021   OT End of Session - 10/12/21 1250     Visit Number 6    Number of Visits 16    Date for OT Re-Evaluation 11/15/21    OT Start Time 0910    OT Stop Time 0945    OT Time Calculation (min) 35 min    Activity Tolerance Patient tolerated treatment well    Behavior During Therapy [facility] for tasks assessed/performed             Past Medical History:  Diagnosis Date   Anemia    Anxiety    Arthritis    osteoarthritis. Bilateral knee replacement, hands, meniscal tear, cervical disc disease   Atherosclerosis    Atypical chest pain    Collagen vascular disease (HCC)    Colon polyp 12/15/2017   Colon polyps    Compression fracture of L2 (HCC)    Diverticulosis    DVT (deep venous thrombosis) (HCC)    Fracture of one rib, right side, subsequent encounter for fracture with routine healing 03/08/2017   GERD (gastroesophageal reflux disease)    History of seronegative inflammatory arthritis    HOH (hard of hearing)    no hearing aids   Hypertension    IBS (irritable bowel syndrome)    Internal hemorrhoids    Phlebitis and thrombophlebitis of deep veins of lower extremities ([place])    after surgery   Vision abnormalities     Past Surgical History:  Procedure Laterality Date   APPENDECTOMY     COLONOSCOPY N/A 01/24/2020   Procedure: COLONOSCOPY;  Surgeon: Lesly Rubenstein, MD;  Location: [facility] ENDOSCOPY;  Service: Endoscopy;  Laterality: N/A;   COLONOSCOPY WITH PROPOFOL N/A 10/24/2017   Procedure: COLONOSCOPY WITH PROPOFOL;  Surgeon: Manya Silvas, MD;  Location: [facility] ENDOSCOPY;  Service: Endoscopy;  Laterality: N/A;   COLONOSCOPY WITH PROPOFOL N/A  11/21/2017   Procedure: COLONOSCOPY WITH PROPOFOL;  Surgeon: Manya Silvas, MD;  Location: [facility] ENDOSCOPY;  Service: Endoscopy;  Laterality: N/A;   DILATION AND CURETTAGE OF UTERUS     ESOPHAGOGASTRODUODENOSCOPY (EGD) WITH PROPOFOL N/A 02/25/2018   Procedure: ESOPHAGOGASTRODUODENOSCOPY (EGD) WITH PROPOFOL;  Surgeon: Manya Silvas, MD;  Location: [facility] ENDOSCOPY;  Service: Endoscopy;  Laterality: N/A;   EYE SURGERY     bilateral catarACT   JOINT REPLACEMENT Bilateral    knee replacement   LAPAROSCOPIC RIGHT COLECTOMY Right 12/15/2017   Procedure: LAPAROSCOPIC RIGHT COLECTOMY;  Surgeon: Robert Bellow, MD;  Location: [facility] ORS;  Service: General;  Laterality: Right;   ORIF HUMERUS FRACTURE Right 08/09/2021   Procedure: IRRIGATION AND DEBRIDEMENT; OPEN REDUCTION INTERNAL FIXATION (ORIF) DISTAL HUMERUS FRACTURE;  Surgeon: Altamese Half Moon, MD;  Location: [place];  Service: Orthopedics;  Laterality: Right;   REPLACEMENT TOTAL KNEE BILATERAL     TOTAL SHOULDER ARTHROPLASTY Right 09/24/2018   Procedure: TOTAL SHOULDER ARTHROPLASTY - REVERSE RIGHT;  Surgeon: Corky Mull, MD;  Location: [facility] ORS;  Service: Orthopedics;  Laterality: Right;    There were no vitals filed for this visit.   Subjective Assessment - 10/12/21 1248     Subjective  I have to confess I did not do my exercises and I was also late with my  medication with the methotrexate.  But I think I am still doing good my swelling is down but I just could not do the exercises I should have with a holiday and then also with family and and out.    Pertinent History Pt is an 88 y.o. female admitted 08/08/21 after fall sustaining open R supracondylar distal humerus fx. S/p R humeral ORIF, ulnar nerve neuroplasty 3/30 by Dr Marcelino Scot. PMH includes HTN, IBS, DVT/PE, arthritis, anxiety, HOH, multiple joint replacements. Pt was in hospital recieving OT and PT followed by [facility] Pt refer to OT with increase pain and swelling in R hand and wrist after fall.  Had to stop her arthritis medication while when she had to had surgery.    Patient Stated Goals I am right-handed so I really need my elbow, wrist and hand.  I want to get my motion and strength better in my right arm and hand, wrist so that I can do the things around the house that I need to do.    Currently in Pain? Yes    Pain Score 3     Pain Location Elbow    Pain Orientation Right    Pain Descriptors / Indicators Tender;Tightness    Pain Type Surgical pain    Pain Onset More than a month ago    Pain Frequency Intermittent              Pt cont to show decrease pain and edema in elbow and R hand /wrist compare to eval and first couple of sessions - she started back on her arthritis medication AROM for R UE increase compare to eval  Assess next session ROM and grip /prehension  And attempt to upgrade to 2 lbs              OT Treatments/Exercises (OP) - 10/12/21 0001       RUE Paraffin   Number Minutes Paraffin 8 Minutes    RUE Paraffin Location Hand   wrist and elbow   Comments Prior to range of motion to wrist and hand as well as forearm.  Decrease stiffness and pain also prior to elbow range of motion              Elbow extension with support on pillow doing active assisted range of motion with light passive range of motion for elbow extension and palm up.  10 reps hold 5 seconds each Elbow to side passive range of motion and active assisted range of motion for supination pronation 10 reps.  Was able to stay to get full supination with elbow extended and then facilitated elbow flexion and supination and the elbow extension.  Followed by 1 pound weight for supination pronation with elbow flexion at 90 1 pound weight for elbow extension with shoulder in 90 degrees flexion-elbow flexion down to ear and up to ceiling. Followed by yellow band wrapped around 1 pound weight for larger grip and hand stabilize at shoulder patient doing elbow extension with yellow  Thera-Band 10 reps With both 1 pound and yellow band OT assisted facilitating endrange elbow extension    home program for patient to do in supine wrist extension stretch by holding with left arm at right wrist given light traction 10 reps. Followed by 1 pound weight or 16 ounce hammer for patient to palms up to ceiling in supine 10 reps.  wrist extension to active assisted range of motion over edge of table moving elbow on wrist-patient able to increase 10 degrees  for wrist extension-10 reps.   Wrist extension and flexion over armrest 10 reps.   Patient continued to same home program for opposition ,fisting , Reviewed again use of Isotoner glove mostly at nighttime.  If is too tight patient can stop wearing it but encouraged him to use her CMC neoprene during the day for thumb and radial wrist pain                    OT Education - 10/12/21 1250     Education Details Progress and review of home exercises as well as scar massage,    Person(s) Educated Patient    Methods Explanation;Demonstration;Tactile cues;Verbal cues;Handout    Comprehension Verbal cues required;Returned demonstration;Verbalized understanding              OT Short Term Goals - 09/20/21 1520       OT SHORT TERM GOAL #1   Title Pt to be independent in HEP to increase AROM in right elbow, wrist and hand  with less pain    Baseline Elbow extension -30, flexion 130, supination 75 with all pain increased endrange.  Wrist extension decreased as well as flexion compared to September last year    Time 3    Period Weeks    Status New    Target Date 10/11/21               OT Long Term Goals - 09/20/21 1521       OT LONG TERM GOAL #1   Title Right elbow flexion increased for patient to be able to put her hearing aids in and fix her hair with right dominant hand with pain less than 3/10    Baseline Patient report able to start eating a little bit with the right hand but limited with supination 75  degrees, elbow flexion 130 with pain    Time 4    Period Weeks    Status New    Target Date 10/18/21      OT LONG TERM GOAL #2   Title Right elbow extension increased with more than 15 degrees for patient to be able to reach in pocket and shelves without pain.    Baseline Elbow extension -30 degrees, supination 75 with pain at all end ranges.    Time 5    Period Weeks    Status New    Target Date 10/25/21      OT LONG TERM GOAL #3   Title Right elbow flexion extension increased for patient to be able to push heavy door open, turn a doorknob and carrying more than 5 pounds without increase symptoms    Baseline Patient 6-1/2 weeks postop pain with end range at -34 extension, 130 for flexion and supination at 75.  Pain and tenderness with swelling at wrist limited in wrist flexion extension.    Time 8    Period Weeks    Status New    Target Date 11/15/21      OT LONG TERM GOAL #4   Title Patient's pain, swelling and active range of motion increased to same than last year.    Baseline Right wrist extension and flexion decreased compared to September of last year as well as grip and prehension strength patient only 6-1/2 weeks postop.  Patient with pain with touch and tenderness over radial wrist and hand.    Time 8    Period Weeks    Status New    Target Date 11/15/21  Plan - 10/12/21 1251     Clinical Impression Statement Patient presented OT evaluation with a diagnosis of her right elbow supracondylar fracture with ORIF on 08/09/2021 after a fall.  Patient also has a flareup of her rheumatoid arthritis symptoms in her right hand and wrist because she had to stop her medication during surgery.  Patient at evaluation with scar adhesion at posterior elbow limiting her motion as well as increased swelling at elbow, wrist mostly over radial wrist as well as hand.  Patient reported increased pain and tenderness but no number provided.  Patient was seen by this OT in  2020 and 2021 and compared to those measurements patient did lose wrist active range of motion as well as some digit range of motion.  Since last session patient showed decrease edema and decreased pain in wrist and digits with increased motion.  As well as increased extension at elbow with active assisted and passive range of motion by OT..  Patient able to do 1 pound weight as well as yellow Thera-Band for elbow extension and show shoulder presses in supine.  Scar improving we will do kinesiotaping again next week.  r.  Remind patient again to continue wear CMC neoprene splint during the day for pain at the thumb [facility] and wrist..  Paraffin done to elbow and hand prior to range of motion and soft tissue to right wrist, forearm supination and elbow extension/flexion with some improvement since start of care.  Home program the same this day.  Assess next week if can increase to 2 pound weight and assess grip and prehension strength.. Patient is right-hand dominant and present with increased scar tissue, increase edema and pain with limited range of motion and decreased strength in right dominant arm limiting her functional use of right hand and ADLs and IADLs.  Patient can benefit from OT services to increase use of right dominant hand.    OT Occupational Profile and History Problem Focused Assessment - Including review of records relating to presenting problem    Occupational performance deficits (Please refer to evaluation for details): ADL's;IADL's;Play;Leisure;Social Participation    Body Structure / Function / Physical Skills ADL;Strength;Decreased knowledge of use of DME;Decreased knowledge of precautions;Flexibility;Scar mobility;ROM;IADL;Edema;UE functional use;Pain;Dexterity    Rehab Potential Good    Clinical Decision Making Limited treatment options, no task modification necessary    Comorbidities Affecting Occupational Performance: None    Modification or Assistance to Complete Evaluation   Min-Moderate modification of tasks or assist with assess necessary to complete eval    OT Frequency 2x / week    OT Duration 8 weeks    OT Treatment/Interventions Self-care/ADL training;Splinting;Therapeutic exercise;Patient/family education;DME and/or AE instruction;Contrast Bath;Scar mobilization;Cryotherapy;Paraffin;Manual Therapy;Passive range of motion    Consulted and Agree with Plan of Care Patient             Patient will benefit from skilled therapeutic intervention in order to improve the following deficits and impairments:   Body Structure / Function / Physical Skills: ADL, Strength, Decreased knowledge of use of DME, Decreased knowledge of precautions, Flexibility, Scar mobility, ROM, IADL, Edema, UE functional use, Pain, Dexterity       Visit Diagnosis: Pain in right elbow  Scar condition and fibrosis of skin  Localized edema  Pain in right wrist  Stiffness of right wrist, not elsewhere classified  Muscle weakness (generalized)  Pain in right hand    Problem List Patient Active Problem List   Diagnosis Date Noted   Normocytic anemia 08/11/2021  Hypokalemia 08/09/2021   Displaced comminuted supracondylar fracture of right humerus without intercondylar fracture 08/08/2021   Open fracture of right humerus 08/08/2021   History of DVT (deep vein thrombosis) 06/06/2021   Rheumatoid arthritis of multiple sites without rheumatoid factor ([place]) 04/24/2021   History of pulmonary embolism 02/04/2020   History of blood clotting disorder 01/12/2020   Immunosuppression ([place]) 01/25/2019   Status post reverse total shoulder replacement, right 09/24/2018   Hyperglycemia 08/31/2018   S/P right colectomy 01/26/2018   Adenomatous polyp of ascending colon 11/24/2017   Generalized anxiety disorder 09/06/2017   Irritable bowel syndrome (IBS) 09/06/2017   Orthostasis 06/05/2016   Left shoulder pain 10/13/2015   Constipation 05/30/2015   Osteoporosis, postmenopausal  05/30/2015   Compression fracture of L2 lumbar vertebra with delayed healing 05/29/2015   Frequent falls 03/26/2015   Atherosclerosis of arteries 03/26/2015   Family history of colon cancer 09/28/2014   TMJ (temporomandibular joint syndrome) 06/12/2014   Varicose veins of both lower extremities without ulcer or inflammation 06/12/2014   B12 deficiency 09/20/2013   Fatigue 05/23/2013   Internal hemorrhoids without complication 14/70/9295   Insomnia 03/03/2013   Vertigo due to cerebrovascular disease 03/03/2013   Seronegative rheumatoid arthritis affecting lower leg ([place]) 03/03/2013    Rosalyn Gess, OTR/L,CLT 10/12/2021, 12:56 PM  [facility] [address] [place], [address] Phone: [phone]   Fax:  [phone]  Name: Teresa Lin MRN: [id number] Date of Birth: 08/14/1931

## 2021-10-15 ENCOUNTER — Ambulatory Visit: Payer: Medicare Other | Admitting: Occupational Therapy

## 2021-10-15 DIAGNOSIS — M25521 Pain in right elbow: Secondary | ICD-10-CM

## 2021-10-15 DIAGNOSIS — M25631 Stiffness of right wrist, not elsewhere classified: Secondary | ICD-10-CM

## 2021-10-15 DIAGNOSIS — R600 Localized edema: Secondary | ICD-10-CM

## 2021-10-15 DIAGNOSIS — L905 Scar conditions and fibrosis of skin: Secondary | ICD-10-CM

## 2021-10-15 DIAGNOSIS — M6281 Muscle weakness (generalized): Secondary | ICD-10-CM

## 2021-10-15 DIAGNOSIS — M25531 Pain in right wrist: Secondary | ICD-10-CM

## 2021-10-15 NOTE — Therapy (Signed)
[facility] [address], [place], [address] Phone: [phone]   Fax:  [phone]  Occupational Therapy Treatment  Patient Details  Name: Teresa Lin MRN: [id number] Date of Birth: [birth year] Referring Provider (OT): Dr Marcelino Scot   Encounter Date: 10/15/2021   OT End of Session - 10/15/21 1706     Visit Number 7    Number of Visits 16    Date for OT Re-Evaluation 11/15/21    OT Start Time 1655    OT Stop Time 1732    OT Time Calculation (min) 37 min    Activity Tolerance Patient tolerated treatment well    Behavior During Therapy [facility] for tasks assessed/performed             Past Medical History:  Diagnosis Date   Anemia    Anxiety    Arthritis    osteoarthritis. Bilateral knee replacement, hands, meniscal tear, cervical disc disease   Atherosclerosis    Atypical chest pain    Collagen vascular disease (HCC)    Colon polyp 12/15/2017   Colon polyps    Compression fracture of L2 (HCC)    Diverticulosis    DVT (deep venous thrombosis) (HCC)    Fracture of one rib, right side, subsequent encounter for fracture with routine healing 03/08/2017   GERD (gastroesophageal reflux disease)    History of seronegative inflammatory arthritis    HOH (hard of hearing)    no hearing aids   Hypertension    IBS (irritable bowel syndrome)    Internal hemorrhoids    Phlebitis and thrombophlebitis of deep veins of lower extremities ([place])    after surgery   Vision abnormalities     Past Surgical History:  Procedure Laterality Date   APPENDECTOMY     COLONOSCOPY N/A 01/24/2020   Procedure: COLONOSCOPY;  Surgeon: Lesly Rubenstein, MD;  Location: [facility] ENDOSCOPY;  Service: Endoscopy;  Laterality: N/A;   COLONOSCOPY WITH PROPOFOL N/A 10/24/2017   Procedure: COLONOSCOPY WITH PROPOFOL;  Surgeon: Manya Silvas, MD;  Location: [facility] ENDOSCOPY;  Service: Endoscopy;  Laterality: N/A;   COLONOSCOPY WITH PROPOFOL N/A  11/21/2017   Procedure: COLONOSCOPY WITH PROPOFOL;  Surgeon: Manya Silvas, MD;  Location: [facility] ENDOSCOPY;  Service: Endoscopy;  Laterality: N/A;   DILATION AND CURETTAGE OF UTERUS     ESOPHAGOGASTRODUODENOSCOPY (EGD) WITH PROPOFOL N/A 02/25/2018   Procedure: ESOPHAGOGASTRODUODENOSCOPY (EGD) WITH PROPOFOL;  Surgeon: Manya Silvas, MD;  Location: [facility] ENDOSCOPY;  Service: Endoscopy;  Laterality: N/A;   EYE SURGERY     bilateral catarACT   JOINT REPLACEMENT Bilateral    knee replacement   LAPAROSCOPIC RIGHT COLECTOMY Right 12/15/2017   Procedure: LAPAROSCOPIC RIGHT COLECTOMY;  Surgeon: Robert Bellow, MD;  Location: [facility] ORS;  Service: General;  Laterality: Right;   ORIF HUMERUS FRACTURE Right 08/09/2021   Procedure: IRRIGATION AND DEBRIDEMENT; OPEN REDUCTION INTERNAL FIXATION (ORIF) DISTAL HUMERUS FRACTURE;  Surgeon: Altamese Elburn, MD;  Location: [facility];  Service: Orthopedics;  Laterality: Right;   REPLACEMENT TOTAL KNEE BILATERAL     TOTAL SHOULDER ARTHROPLASTY Right 09/24/2018   Procedure: TOTAL SHOULDER ARTHROPLASTY - REVERSE RIGHT;  Surgeon: Corky Mull, MD;  Location: [facility] ORS;  Service: Orthopedics;  Laterality: Right;    There were no vitals filed for this visit.   Subjective Assessment - 10/15/21 1704     Subjective  I do not know what I did different but look both my wrist had a flareup of  arthritis.  I think I took my methotrexate.  I do write it down.  I am due for my infusion soon next week I think.    Pertinent History Pt is an 88 y.o. female admitted 08/08/21 after fall sustaining open R supracondylar distal humerus fx. S/p R humeral ORIF, ulnar nerve neuroplasty 3/30 by Dr Marcelino Scot. PMH includes HTN, IBS, DVT/PE, arthritis, anxiety, HOH, multiple joint replacements. Pt was in hospital recieving OT and PT followed by [facility]. Pt refer to OT with increase pain and swelling in R hand and wrist after fall. Had to stop her arthritis medication while when she had to had surgery.     Patient Stated Goals I am right-handed so I really need my elbow, wrist and hand.  I want to get my motion and strength better in my right arm and hand, wrist so that I can do the things around the house that I need to do.    Currently in Pain? Yes    Pain Score 4     Pain Location Wrist    Pain Orientation Right;Left    Pain Descriptors / Indicators Aching    Pain Onset More than a month ago                  Patient do state coming in with increase swelling and pain reported in bilateral wrist and hands.  She do report that she thinks she did take her methotrexate.  Due for another infusion next week she thinks. Did not do paraffin to the hands this date only to elbow.  Prior to range of motion.          OT Treatments/Exercises (OP) - 10/15/21 0001       RUE Paraffin   Number Minutes Paraffin 8 Minutes    RUE Paraffin Location --   Elbow   Comments prior to ROM and scar tissue            Elbow extension active assisted range of motion and passive range of motion for elbow extension and palm up.  10 reps hold 5 seconds each Elbow to side passive range of motion and active assisted range of motion for supination pronation 10 reps.  Was able to stay to get full supination with elbow extended and then facilitated elbow flexion and supination and the elbow extension.  Followed by 1 pound weight for supination pronation with elbow flexion at 90 1 pound weight for elbow extension with shoulder in 90 degrees flexion-elbow flexion down to ear and up to ceiling. Followed by yellow band wrapped around hand and hand stabilize at shoulder patient doing elbow extension with yellow Thera-Band  2 x 10 reps- add this date to HEP  Elbow extention looking well today Able to do 2 lbs this date in supine - punching up and elbow flexion to ear and shoulder at 90 flexion  In supine - 10 reps each   BTE for 6 min change direction - 2 min    Continue the same wrist and finger exercises  patient was close to baseline compared to when seen in the past prior to this fracture.   Reviewed again use of Isotoner glove mostly at nighttime.  If is too tight patient can stop wearing it but encouraged him to use her CMC neoprene during the day for thumb and radial wrist pain        OT Education - 10/15/21 1706     Education Details Progress and review of home  exercises as well as scar massage,    Person(s) Educated Patient    Methods Explanation;Demonstration;Tactile cues;Verbal cues;Handout    Comprehension Verbal cues required;Returned demonstration;Verbalized understanding              OT Short Term Goals - 09/20/21 1520       OT SHORT TERM GOAL #1   Title Pt to be independent in HEP to increase AROM in right elbow, wrist and hand  with less pain    Baseline Elbow extension -30, flexion 130, supination 75 with all pain increased endrange.  Wrist extension decreased as well as flexion compared to September last year    Time 3    Period Weeks    Status New    Target Date 10/11/21               OT Long Term Goals - 09/20/21 1521       OT LONG TERM GOAL #1   Title Right elbow flexion increased for patient to be able to put her hearing aids in and fix her hair with right dominant hand with pain less than 3/10    Baseline Patient report able to start eating a little bit with the right hand but limited with supination 75 degrees, elbow flexion 130 with pain    Time 4    Period Weeks    Status New    Target Date 10/18/21      OT LONG TERM GOAL #2   Title Right elbow extension increased with more than 15 degrees for patient to be able to reach in pocket and shelves without pain.    Baseline Elbow extension -30 degrees, supination 75 with pain at all end ranges.    Time 5    Period Weeks    Status New    Target Date 10/25/21      OT LONG TERM GOAL #3   Title Right elbow flexion extension increased for patient to be able to push heavy door open, turn a  doorknob and carrying more than 5 pounds without increase symptoms    Baseline Patient 6-1/2 weeks postop pain with end range at -34 extension, 130 for flexion and supination at 75.  Pain and tenderness with swelling at wrist limited in wrist flexion extension.    Time 8    Period Weeks    Status New    Target Date 11/15/21      OT LONG TERM GOAL #4   Title Patient's pain, swelling and active range of motion increased to same than last year.    Baseline Right wrist extension and flexion decreased compared to September of last year as well as grip and prehension strength patient only 6-1/2 weeks postop.  Patient with pain with touch and tenderness over radial wrist and hand.    Time 8    Period Weeks    Status New    Target Date 11/15/21                   Plan - 10/15/21 1706     Clinical Impression Statement Patient presented OT evaluation with a diagnosis of her right elbow supracondylar fracture with ORIF on 08/09/2021 after a fall.  Patient also has a flareup of her rheumatoid arthritis symptoms in her right hand and wrist because she had to stop her medication during surgery.  Patient at evaluation with scar adhesion at posterior elbow limiting her motion as well as increased swelling at elbow, wrist mostly over radial  wrist as well as hand.  Patient reported increased pain and tenderness but no number provided.  Patient was seen by this OT in 2020 and 2021 and compared to those measurements patient did lose wrist active range of motion as well as some digit range of motion.  Since evaluation patient showed  decrease edema and decreased pain in wrist and digits with increased motion.  As well as increased extension at elbow with active assisted and passive range of motion by OT..  Patient able to do 1 pound weight  at home and add this date yellow Thera-Band for elbow extension  in supine. Did this date in session 2 lbs for elbow extention but pain in elbow but able to do 10 reps. .   Scar improving we will do kinesiotaping again next time .  Remind patient again to continue wear CMC neoprene splint during the day for pain at the thumb [facility] and wrist..  Paraffin done to elbow this date- pt having flareup today in wrist and hand.  Patient is right-hand dominant and present with increased scar tissue, increase edema and pain with limited range of motion and decreased strength in right dominant arm limiting her functional use of right hand and ADLs and IADLs.  Patient can benefit from OT services to increase use of right dominant hand.    OT Occupational Profile and History Problem Focused Assessment - Including review of records relating to presenting problem    Occupational performance deficits (Please refer to evaluation for details): ADL's;IADL's;Play;Leisure;Social Participation    Body Structure / Function / Physical Skills ADL;Strength;Decreased knowledge of use of DME;Decreased knowledge of precautions;Flexibility;Scar mobility;ROM;IADL;Edema;UE functional use;Pain;Dexterity    Rehab Potential Good    Clinical Decision Making Limited treatment options, no task modification necessary    Comorbidities Affecting Occupational Performance: None    Modification or Assistance to Complete Evaluation  Min-Moderate modification of tasks or assist with assess necessary to complete eval    OT Frequency 2x / week    OT Duration 8 weeks    OT Treatment/Interventions Self-care/ADL training;Splinting;Therapeutic exercise;Patient/family education;DME and/or AE instruction;Contrast Bath;Scar mobilization;Cryotherapy;Paraffin;Manual Therapy;Passive range of motion    Consulted and Agree with Plan of Care Patient             Patient will benefit from skilled therapeutic intervention in order to improve the following deficits and impairments:   Body Structure / Function / Physical Skills: ADL, Strength, Decreased knowledge of use of DME, Decreased knowledge of precautions, Flexibility, Scar  mobility, ROM, IADL, Edema, UE functional use, Pain, Dexterity       Visit Diagnosis: Pain in right elbow  Scar condition and fibrosis of skin  Localized edema  Pain in right wrist  Stiffness of right wrist, not elsewhere classified  Muscle weakness (generalized)    Problem List Patient Active Problem List   Diagnosis Date Noted   Normocytic anemia 08/11/2021   Hypokalemia 08/09/2021   Displaced comminuted supracondylar fracture of right humerus without intercondylar fracture 08/08/2021   Open fracture of right humerus 08/08/2021   History of DVT (deep vein thrombosis) 06/06/2021   Rheumatoid arthritis of multiple sites without rheumatoid factor ([place]) 04/24/2021   History of pulmonary embolism 02/04/2020   History of blood clotting disorder 01/12/2020   Immunosuppression ([place]) 01/25/2019   Status post reverse total shoulder replacement, right 09/24/2018   Hyperglycemia 08/31/2018   S/P right colectomy 01/26/2018   Adenomatous polyp of ascending colon 11/24/2017   Generalized anxiety disorder 09/06/2017   Irritable bowel  syndrome (IBS) 09/06/2017   Orthostasis 06/05/2016   Left shoulder pain 10/13/2015   Constipation 05/30/2015   Osteoporosis, postmenopausal 05/30/2015   Compression fracture of L2 lumbar vertebra with delayed healing 05/29/2015   Frequent falls 03/26/2015   Atherosclerosis of arteries 03/26/2015   Family history of colon cancer 09/28/2014   TMJ (temporomandibular joint syndrome) 06/12/2014   Varicose veins of both lower extremities without ulcer or inflammation 06/12/2014   B12 deficiency 09/20/2013   Fatigue 05/23/2013   Internal hemorrhoids without complication 47/42/5956   Insomnia 03/03/2013   Vertigo due to cerebrovascular disease 03/03/2013   Seronegative rheumatoid arthritis affecting lower leg ([place]) 03/03/2013    Rosalyn Gess, OTR/L,CLT 10/15/2021, 5:36 PM  [facility] [address], [place], [address] Phone: [phone]   Fax:  [phone]  Name: Teresa Lin MRN: [id number] Date of Birth: Aug 17, 1931

## 2021-10-18 ENCOUNTER — Ambulatory Visit: Payer: Medicare Other | Admitting: Occupational Therapy

## 2021-10-18 DIAGNOSIS — L905 Scar conditions and fibrosis of skin: Secondary | ICD-10-CM

## 2021-10-18 DIAGNOSIS — M25521 Pain in right elbow: Secondary | ICD-10-CM

## 2021-10-18 DIAGNOSIS — R600 Localized edema: Secondary | ICD-10-CM

## 2021-10-22 ENCOUNTER — Encounter: Payer: Self-pay | Admitting: Occupational Therapy

## 2021-10-22 NOTE — Therapy (Signed)
[facility] [address], [place], [address] Phone: [phone]   Fax:  [phone]  Occupational Therapy Treatment  Patient Details  Name: Teresa Lin MRN: [id number] Date of Birth: [birth year] Referring Provider (OT): Dr Marcelino Scot   Encounter Date: 10/18/2021   OT End of Session - 10/22/21 2128     Visit Number 8    Number of Visits 16    Date for OT Re-Evaluation 11/15/21    OT Start Time 1445    OT Stop Time 1533    OT Time Calculation (min) 48 min    Activity Tolerance Patient tolerated treatment well    Behavior During Therapy [facility] for tasks assessed/performed             Past Medical History:  Diagnosis Date   Anemia    Anxiety    Arthritis    osteoarthritis. Bilateral knee replacement, hands, meniscal tear, cervical disc disease   Atherosclerosis    Atypical chest pain    Collagen vascular disease (HCC)    Colon polyp 12/15/2017   Colon polyps    Compression fracture of L2 (HCC)    Diverticulosis    DVT (deep venous thrombosis) (HCC)    Fracture of one rib, right side, subsequent encounter for fracture with routine healing 03/08/2017   GERD (gastroesophageal reflux disease)    History of seronegative inflammatory arthritis    HOH (hard of hearing)    no hearing aids   Hypertension    IBS (irritable bowel syndrome)    Internal hemorrhoids    Phlebitis and thrombophlebitis of deep veins of lower extremities ([place])    after surgery   Vision abnormalities     Past Surgical History:  Procedure Laterality Date   APPENDECTOMY     COLONOSCOPY N/A 01/24/2020   Procedure: COLONOSCOPY;  Surgeon: Lesly Rubenstein, MD;  Location: [facility] ENDOSCOPY;  Service: Endoscopy;  Laterality: N/A;   COLONOSCOPY WITH PROPOFOL N/A 10/24/2017   Procedure: COLONOSCOPY WITH PROPOFOL;  Surgeon: Manya Silvas, MD;  Location: [facility] ENDOSCOPY;  Service: Endoscopy;  Laterality: N/A;   COLONOSCOPY WITH PROPOFOL N/A  11/21/2017   Procedure: COLONOSCOPY WITH PROPOFOL;  Surgeon: Manya Silvas, MD;  Location: [facility] ENDOSCOPY;  Service: Endoscopy;  Laterality: N/A;   DILATION AND CURETTAGE OF UTERUS     ESOPHAGOGASTRODUODENOSCOPY (EGD) WITH PROPOFOL N/A 02/25/2018   Procedure: ESOPHAGOGASTRODUODENOSCOPY (EGD) WITH PROPOFOL;  Surgeon: Manya Silvas, MD;  Location: [facility] ENDOSCOPY;  Service: Endoscopy;  Laterality: N/A;   EYE SURGERY     bilateral catarACT   JOINT REPLACEMENT Bilateral    knee replacement   LAPAROSCOPIC RIGHT COLECTOMY Right 12/15/2017   Procedure: LAPAROSCOPIC RIGHT COLECTOMY;  Surgeon: Robert Bellow, MD;  Location: [facility] ORS;  Service: General;  Laterality: Right;   ORIF HUMERUS FRACTURE Right 08/09/2021   Procedure: IRRIGATION AND DEBRIDEMENT; OPEN REDUCTION INTERNAL FIXATION (ORIF) DISTAL HUMERUS FRACTURE;  Surgeon: Altamese Capon Bridge, MD;  Location: [facility];  Service: Orthopedics;  Laterality: Right;   REPLACEMENT TOTAL KNEE BILATERAL     TOTAL SHOULDER ARTHROPLASTY Right 09/24/2018   Procedure: TOTAL SHOULDER ARTHROPLASTY - REVERSE RIGHT;  Surgeon: Corky Mull, MD;  Location: [facility] ORS;  Service: Orthopedics;  Laterality: Right;    There were no vitals filed for this visit.   Subjective Assessment - 10/22/21 2126     Subjective  Pt reports she is having a hard time with doing the exercises with 2# weight from last  time.  "I don't really want to go back to the one pound."    Pertinent History Pt is an 88 y.o. female admitted 08/08/21 after fall sustaining open R supracondylar distal humerus fx. S/p R humeral ORIF, ulnar nerve neuroplasty 3/30 by Dr Marcelino Scot. PMH includes HTN, IBS, DVT/PE, arthritis, anxiety, HOH, multiple joint replacements. Pt was in hospital recieving OT and PT followed by [facility]. Pt refer to OT with increase pain and swelling in R hand and wrist after fall. Had to stop her arthritis medication while when she had to had surgery.    Patient Stated Goals I am right-handed so I  really need my elbow, wrist and hand.  I want to get my motion and strength better in my right arm and hand, wrist so that I can do the things around the house that I need to do.    Currently in Pain? Yes    Pain Score 3     Pain Location Wrist    Pain Orientation Right;Left    Pain Descriptors / Indicators Aching    Pain Onset More than a month ago    Pain Frequency Intermittent              Pt reports pain in hands/wrists have continued to improve.  Paraffin to right elbow for 8 mins prior to exercises to decrease pain, increase tissue mobility and improve ROM.     Elbow extension active assisted range of motion and passive range of motion for elbow extension and palm up.  10 reps hold 5 seconds each Elbow to side passive range of motion and active assisted range of motion for supination pronation 10 reps.  Was able to stay to get full supination with elbow extended and then facilitated elbow flexion and supination and the elbow extension.  Followed by 1 pound weight for supination pronation with elbow flexion at 90 1 pound weight for elbow extension with shoulder in 90 degrees flexion-elbow flexion down to ear and up to ceiling. Followed by yellow band wrapped around hand and hand stabilize at shoulder patient doing elbow extension with yellow Thera-Band  2 x 10 reps review of this exercise today, pt reported she did not recall how to perform at home.   2 lbs this date in supine - punching up and elbow flexion to ear and shoulder at 90 flexion, pt reports she performed 2 sets of 10 at home but feels some pain in her mid thoracic  area, advised pt to decrease back to 1# however she reports she wanted to stay at 2#.  Advised her to decrease reps to one set, if she still has pain to also decrease weight to 1#.     BTE for 6 min change direction - 2 min, forwards/backwards without resistance, therapist in constant attendance to monitor pain, directionality and form.                        OT Education - 10/22/21 2128     Education Details home exercises, scar massage, adjustments to weights    Person(s) Educated Patient    Methods Explanation;Demonstration;Tactile cues;Verbal cues;Handout    Comprehension Verbal cues required;Returned demonstration;Verbalized understanding              OT Short Term Goals - 09/20/21 1520       OT SHORT TERM GOAL #1   Title Pt to be independent in HEP to increase AROM in right elbow, wrist and hand  with less pain    Baseline Elbow extension -30, flexion 130, supination 75 with all pain increased endrange.  Wrist extension decreased as well as flexion compared to September last year    Time 3    Period Weeks    Status New    Target Date 10/11/21               OT Long Term Goals - 09/20/21 1521       OT LONG TERM GOAL #1   Title Right elbow flexion increased for patient to be able to put her hearing aids in and fix her hair with right dominant hand with pain less than 3/10    Baseline Patient report able to start eating a little bit with the right hand but limited with supination 75 degrees, elbow flexion 130 with pain    Time 4    Period Weeks    Status New    Target Date 10/18/21      OT LONG TERM GOAL #2   Title Right elbow extension increased with more than 15 degrees for patient to be able to reach in pocket and shelves without pain.    Baseline Elbow extension -30 degrees, supination 75 with pain at all end ranges.    Time 5    Period Weeks    Status New    Target Date 10/25/21      OT LONG TERM GOAL #3   Title Right elbow flexion extension increased for patient to be able to push heavy door open, turn a doorknob and carrying more than 5 pounds without increase symptoms    Baseline Patient 6-1/2 weeks postop pain with end range at -34 extension, 130 for flexion and supination at 75.  Pain and tenderness with swelling at wrist limited in wrist flexion extension.    Time 8     Period Weeks    Status New    Target Date 11/15/21      OT LONG TERM GOAL #4   Title Patient's pain, swelling and active range of motion increased to same than last year.    Baseline Right wrist extension and flexion decreased compared to September of last year as well as grip and prehension strength patient only 6-1/2 weeks postop.  Patient with pain with touch and tenderness over radial wrist and hand.    Time 8    Period Weeks    Status New    Target Date 11/15/21                   Plan - 10/22/21 2129     Clinical Impression Statement Patient presented OT evaluation with a diagnosis of her right elbow supracondylar fracture with ORIF on 08/09/2021 after a fall.  Patient also has a flareup of her rheumatoid arthritis symptoms in her right hand and wrist because she had to stop her medication during surgery.  Patient at evaluation with scar adhesion at posterior elbow limiting her motion as well as increased swelling at elbow, wrist mostly over radial wrist as well as hand.  Patient reported increased pain and tenderness but no number provided.  Patient was seen by this OT in 2020 and 2021 and compared to those measurements patient did lose wrist active range of motion as well as some digit range of motion.  Since evaluation patient showed  decrease edema and decreased pain in wrist and digits with increased motion.  As well as increased extension at elbow with active assisted and  passive range of motion by OT.  Patient able to do 1 pound weight  at home and add this date yellow Thera-Band for elbow extension  in supine. Pt with increased pain with 2# weight, advised her to decrease to 1#, she reports she wants to stay with 2#, recommend start with decreasing reps but if pain still present she will likely need to decrease back to 1# as recommended.   Issued additional cica care for scar management this date. Patient is right-hand dominant and present with increased scar tissue, increase  edema and pain with limited range of motion and decreased strength in right dominant arm limiting her functional use of right hand and ADLs and IADLs.  Patient can benefit from OT services to increase use of right dominant hand.    OT Occupational Profile and History Problem Focused Assessment - Including review of records relating to presenting problem    Occupational performance deficits (Please refer to evaluation for details): ADL's;IADL's;Play;Leisure;Social Participation    Body Structure / Function / Physical Skills ADL;Strength;Decreased knowledge of use of DME;Decreased knowledge of precautions;Flexibility;Scar mobility;ROM;IADL;Edema;UE functional use;Pain;Dexterity    Rehab Potential Good    Clinical Decision Making Limited treatment options, no task modification necessary    Comorbidities Affecting Occupational Performance: None    Modification or Assistance to Complete Evaluation  Min-Moderate modification of tasks or assist with assess necessary to complete eval    OT Frequency 2x / week    OT Duration 8 weeks    OT Treatment/Interventions Self-care/ADL training;Splinting;Therapeutic exercise;Patient/family education;DME and/or AE instruction;Contrast Bath;Scar mobilization;Cryotherapy;Paraffin;Manual Therapy;Passive range of motion    Consulted and Agree with Plan of Care Patient             Patient will benefit from skilled therapeutic intervention in order to improve the following deficits and impairments:   Body Structure / Function / Physical Skills: ADL, Strength, Decreased knowledge of use of DME, Decreased knowledge of precautions, Flexibility, Scar mobility, ROM, IADL, Edema, UE functional use, Pain, Dexterity       Visit Diagnosis: Pain in right elbow  Scar condition and fibrosis of skin  Localized edema    Problem List Patient Active Problem List   Diagnosis Date Noted   Normocytic anemia 08/11/2021   Hypokalemia 08/09/2021   Displaced comminuted  supracondylar fracture of right humerus without intercondylar fracture 08/08/2021   Open fracture of right humerus 08/08/2021   History of DVT (deep vein thrombosis) 06/06/2021   Rheumatoid arthritis of multiple sites without rheumatoid factor ([place]) 04/24/2021   History of pulmonary embolism 02/04/2020   History of blood clotting disorder 01/12/2020   Immunosuppression ([place]) 01/25/2019   Status post reverse total shoulder replacement, right 09/24/2018   Hyperglycemia 08/31/2018   S/P right colectomy 01/26/2018   Adenomatous polyp of ascending colon 11/24/2017   Generalized anxiety disorder 09/06/2017   Irritable bowel syndrome (IBS) 09/06/2017   Orthostasis 06/05/2016   Left shoulder pain 10/13/2015   Constipation 05/30/2015   Osteoporosis, postmenopausal 05/30/2015   Compression fracture of L2 lumbar vertebra with delayed healing 05/29/2015   Frequent falls 03/26/2015   Atherosclerosis of arteries 03/26/2015   Family history of colon cancer 09/28/2014   TMJ (temporomandibular joint syndrome) 06/12/2014   Varicose veins of both lower extremities without ulcer or inflammation 06/12/2014   B12 deficiency 09/20/2013   Fatigue 05/23/2013   Internal hemorrhoids without complication 42/35/3614   Insomnia 03/03/2013   Vertigo due to cerebrovascular disease 03/03/2013   Seronegative rheumatoid arthritis affecting lower leg ([place]) 03/03/2013  Shruthi Northrup Oneita Jolly, OTR/L, CLT  Iva Posten, OT 10/22/2021, 9:45 PM  [facility] [address], [place], [address] Phone: [phone]   Fax:  [phone]  Name: Teresa Lin MRN: [id number] Date of Birth: July 28, 1931

## 2021-10-23 ENCOUNTER — Ambulatory Visit: Payer: Medicare Other | Admitting: Occupational Therapy

## 2021-10-23 DIAGNOSIS — M25521 Pain in right elbow: Secondary | ICD-10-CM

## 2021-10-23 DIAGNOSIS — L905 Scar conditions and fibrosis of skin: Secondary | ICD-10-CM

## 2021-10-23 DIAGNOSIS — M25531 Pain in right wrist: Secondary | ICD-10-CM

## 2021-10-23 DIAGNOSIS — M6281 Muscle weakness (generalized): Secondary | ICD-10-CM

## 2021-10-23 DIAGNOSIS — M25631 Stiffness of right wrist, not elsewhere classified: Secondary | ICD-10-CM

## 2021-10-23 DIAGNOSIS — R600 Localized edema: Secondary | ICD-10-CM

## 2021-10-23 NOTE — Therapy (Signed)
[facility] [address] [place], [address] Phone: [phone]   Fax:  [phone]  Occupational Therapy Treatment  Patient Details  Name: Teresa Lin MRN: [id number] Date of Birth: [birth year] Referring Provider (OT): Dr Marcelino Scot   Encounter Date: 10/23/2021   OT End of Session - 10/23/21 0923     Visit Number 9    Number of Visits 16    Date for OT Re-Evaluation 11/15/21    OT Start Time 0905    OT Stop Time 0948    OT Time Calculation (min) 43 min    Activity Tolerance Patient tolerated treatment well    Behavior During Therapy [facility] for tasks assessed/performed             Past Medical History:  Diagnosis Date   Anemia    Anxiety    Arthritis    osteoarthritis. Bilateral knee replacement, hands, meniscal tear, cervical disc disease   Atherosclerosis    Atypical chest pain    Collagen vascular disease (HCC)    Colon polyp 12/15/2017   Colon polyps    Compression fracture of L2 (HCC)    Diverticulosis    DVT (deep venous thrombosis) (HCC)    Fracture of one rib, right side, subsequent encounter for fracture with routine healing 03/08/2017   GERD (gastroesophageal reflux disease)    History of seronegative inflammatory arthritis    HOH (hard of hearing)    no hearing aids   Hypertension    IBS (irritable bowel syndrome)    Internal hemorrhoids    Phlebitis and thrombophlebitis of deep veins of lower extremities ([place])    after surgery   Vision abnormalities     Past Surgical History:  Procedure Laterality Date   APPENDECTOMY     COLONOSCOPY N/A 01/24/2020   Procedure: COLONOSCOPY;  Surgeon: Lesly Rubenstein, MD;  Location: [facility] ENDOSCOPY;  Service: Endoscopy;  Laterality: N/A;   COLONOSCOPY WITH PROPOFOL N/A 10/24/2017   Procedure: COLONOSCOPY WITH PROPOFOL;  Surgeon: Manya Silvas, MD;  Location: [facility] ENDOSCOPY;  Service: Endoscopy;  Laterality: N/A;   COLONOSCOPY WITH PROPOFOL  N/A 11/21/2017   Procedure: COLONOSCOPY WITH PROPOFOL;  Surgeon: Manya Silvas, MD;  Location: [facility] ENDOSCOPY;  Service: Endoscopy;  Laterality: N/A;   DILATION AND CURETTAGE OF UTERUS     ESOPHAGOGASTRODUODENOSCOPY (EGD) WITH PROPOFOL N/A 02/25/2018   Procedure: ESOPHAGOGASTRODUODENOSCOPY (EGD) WITH PROPOFOL;  Surgeon: Manya Silvas, MD;  Location: [facility] ENDOSCOPY;  Service: Endoscopy;  Laterality: N/A;   EYE SURGERY     bilateral catarACT   JOINT REPLACEMENT Bilateral    knee replacement   LAPAROSCOPIC RIGHT COLECTOMY Right 12/15/2017   Procedure: LAPAROSCOPIC RIGHT COLECTOMY;  Surgeon: Robert Bellow, MD;  Location: [facility] ORS;  Service: General;  Laterality: Right;   ORIF HUMERUS FRACTURE Right 08/09/2021   Procedure: IRRIGATION AND DEBRIDEMENT; OPEN REDUCTION INTERNAL FIXATION (ORIF) DISTAL HUMERUS FRACTURE;  Surgeon: Altamese Missoula, MD;  Location: [place];  Service: Orthopedics;  Laterality: Right;   REPLACEMENT TOTAL KNEE BILATERAL     TOTAL SHOULDER ARTHROPLASTY Right 09/24/2018   Procedure: TOTAL SHOULDER ARTHROPLASTY - REVERSE RIGHT;  Surgeon: Corky Mull, MD;  Location: [facility] ORS;  Service: Orthopedics;  Laterality: Right;    There were no vitals filed for this visit.   Subjective Assessment - 10/23/21 0922     Subjective  The elbow hurts some but not bad arthritis is better today than last time.  You need  to go over the exercises again I did do the yellow band and I brought my 2 pound weight    Pertinent History Pt is an 88 y.o. female admitted 08/08/21 after fall sustaining open R supracondylar distal humerus fx. S/p R humeral ORIF, ulnar nerve neuroplasty 3/30 by Dr Marcelino Scot. PMH includes HTN, IBS, DVT/PE, arthritis, anxiety, HOH, multiple joint replacements. Pt was in hospital recieving OT and PT followed by [facility]. Pt refer to OT with increase pain and swelling in R hand and wrist after fall. Had to stop her arthritis medication while when she had to had surgery.    Patient Stated  Goals I am right-handed so I really need my elbow, wrist and hand.  I want to get my motion and strength better in my right arm and hand, wrist so that I can do the things around the house that I need to do.    Currently in Pain? Yes    Pain Score 1     Pain Location Elbow    Pain Orientation Right    Pain Descriptors / Indicators Aching;Tender                [facility] OT Assessment - 10/23/21 0001       AROM   Right Elbow Flexion -19    Right Elbow Extension 135    Right Forearm Pronation 90 Degrees    Right Forearm Supination 85 Degrees                      OT Treatments/Exercises (OP) - 10/23/21 0001       RUE Paraffin   Number Minutes Paraffin 8 Minutes    RUE Paraffin Location --   Elbow , wrist and hand   Comments Prior to range of motion to elbow and wrist to decrease pain increase motion             Elbow extension active assisted range of motion and passive range of motion for elbow extension and palm up.  10 reps hold 5 seconds each Elbow to side passive range of motion and active assisted range of motion for supination pronation 10 reps.  Was able to stay to get full supination with elbow extended and then facilitated elbow flexion and supination and the elbow extension.    Was able to get active range of motion elbow extension -9 after paraffin and heating pad stretch with passive active range of motion  Patient asked to review and done  yellow band wrapped around right hand and left hand stabilize at right shoulder -patient to use palm not to grip Thera-Band as well as keeping right shoulder down -patient doing elbow extension with yellow Thera-Band  2 x 10 reps-patient to do twice a day following heat  Progressing very well in elbow extension Also done 2 lbs this date in supine - punching up 2 sets of 10  and elbow flexion to ear into elbow extension up to ceiling with keeping shoulder at 90 flexion-patient can use left hand to assist shoulder at 90  degree stabilization  In supine -2 sets of 8 reps each    BTE for 4 min change direction - 2 min    Continue the same wrist and finger exercises patient was close to baseline compared to when seen in the past prior to this fracture.   Reviewed again use of Isotoner glove mostly at nighttime.  If is too tight patient can stop wearing it but encouraged him  to use her [facility] neoprene during the day for thumb and radial wrist pain         OT Education - 10/23/21 0923     Education Details home exercises, scar massage, adjustments to weights    Person(s) Educated Patient    Methods Explanation;Demonstration;Tactile cues;Verbal cues;Handout    Comprehension Verbal cues required;Returned demonstration;Verbalized understanding              OT Short Term Goals - 09/20/21 1520       OT SHORT TERM GOAL #1   Title Pt to be independent in HEP to increase AROM in right elbow, wrist and hand  with less pain    Baseline Elbow extension -30, flexion 130, supination 75 with all pain increased endrange.  Wrist extension decreased as well as flexion compared to September last year    Time 3    Period Weeks    Status New    Target Date 10/11/21               OT Long Term Goals - 09/20/21 1521       OT LONG TERM GOAL #1   Title Right elbow flexion increased for patient to be able to put her hearing aids in and fix her hair with right dominant hand with pain less than 3/10    Baseline Patient report able to start eating a little bit with the right hand but limited with supination 75 degrees, elbow flexion 130 with pain    Time 4    Period Weeks    Status New    Target Date 10/18/21      OT LONG TERM GOAL #2   Title Right elbow extension increased with more than 15 degrees for patient to be able to reach in pocket and shelves without pain.    Baseline Elbow extension -30 degrees, supination 75 with pain at all end ranges.    Time 5    Period Weeks    Status New    Target Date  10/25/21      OT LONG TERM GOAL #3   Title Right elbow flexion extension increased for patient to be able to push heavy door open, turn a doorknob and carrying more than 5 pounds without increase symptoms    Baseline Patient 6-1/2 weeks postop pain with end range at -34 extension, 130 for flexion and supination at 75.  Pain and tenderness with swelling at wrist limited in wrist flexion extension.    Time 8    Period Weeks    Status New    Target Date 11/15/21      OT LONG TERM GOAL #4   Title Patient's pain, swelling and active range of motion increased to same than last year.    Baseline Right wrist extension and flexion decreased compared to September of last year as well as grip and prehension strength patient only 6-1/2 weeks postop.  Patient with pain with touch and tenderness over radial wrist and hand.    Time 8    Period Weeks    Status New    Target Date 11/15/21                   Plan - 10/23/21 5277     Clinical Impression Statement Patient presented OT evaluation with a diagnosis of her right elbow supracondylar fracture with ORIF on 08/09/2021 after a fall.  Patient also has a flareup of her rheumatoid arthritis symptoms in her right hand and  wrist because she had to stop her medication during surgery.  Patient at evaluation with scar adhesion at posterior elbow limiting her motion as well as increased swelling at elbow, wrist mostly over radial wrist as well as hand.  Patient reported increased pain and tenderness but no number provided.  Patient was seen by this OT in 2020 and 2021 and compared to those measurements patient did lose wrist active range of motion as well as some digit range of motion.  Since evaluation patient showed  decrease edema and decreased pain in wrist and digits with increased motion.  As well as increased extension at elbow with active assisted and passive range of motion by OT in session to -9 and flexion 135.  Patient able to do 1 pound weight   at home and add last week and reviewed again  yellow Thera-Band for elbow extension  in supine. Pt with increased pain with 2# weight but able to do 2 sets fof 8. Patient is right-hand dominant and present with increased scar tissue, increase edema and pain with limited range of motion and decreased strength in right dominant arm limiting her functional use of right hand and ADLs and IADLs.  Patient can benefit from OT services to increase use of right dominant hand.    OT Occupational Profile and History Problem Focused Assessment - Including review of records relating to presenting problem    Occupational performance deficits (Please refer to evaluation for details): ADL's;IADL's;Play;Leisure;Social Participation    Body Structure / Function / Physical Skills ADL;Strength;Decreased knowledge of use of DME;Decreased knowledge of precautions;Flexibility;Scar mobility;ROM;IADL;Edema;UE functional use;Pain;Dexterity    Rehab Potential Good    Clinical Decision Making Limited treatment options, no task modification necessary    Comorbidities Affecting Occupational Performance: None    Modification or Assistance to Complete Evaluation  Min-Moderate modification of tasks or assist with assess necessary to complete eval    OT Frequency 2x / week    OT Duration 8 weeks    OT Treatment/Interventions Self-care/ADL training;Splinting;Therapeutic exercise;Patient/family education;DME and/or AE instruction;Contrast Bath;Scar mobilization;Cryotherapy;Paraffin;Manual Therapy;Passive range of motion    Consulted and Agree with Plan of Care Patient             Patient will benefit from skilled therapeutic intervention in order to improve the following deficits and impairments:   Body Structure / Function / Physical Skills: ADL, Strength, Decreased knowledge of use of DME, Decreased knowledge of precautions, Flexibility, Scar mobility, ROM, IADL, Edema, UE functional use, Pain, Dexterity       Visit  Diagnosis: Pain in right elbow  Scar condition and fibrosis of skin  Localized edema  Pain in right wrist  Stiffness of right wrist, not elsewhere classified  Muscle weakness (generalized)    Problem List Patient Active Problem List   Diagnosis Date Noted   Normocytic anemia 08/11/2021   Hypokalemia 08/09/2021   Displaced comminuted supracondylar fracture of right humerus without intercondylar fracture 08/08/2021   Open fracture of right humerus 08/08/2021   History of DVT (deep vein thrombosis) 06/06/2021   Rheumatoid arthritis of multiple sites without rheumatoid factor ([place]) 04/24/2021   History of pulmonary embolism 02/04/2020   History of blood clotting disorder 01/12/2020   Immunosuppression ([place]) 01/25/2019   Status post reverse total shoulder replacement, right 09/24/2018   Hyperglycemia 08/31/2018   S/P right colectomy 01/26/2018   Adenomatous polyp of ascending colon 11/24/2017   Generalized anxiety disorder 09/06/2017   Irritable bowel syndrome (IBS) 09/06/2017   Orthostasis 06/05/2016  Left shoulder pain 10/13/2015   Constipation 05/30/2015   Osteoporosis, postmenopausal 05/30/2015   Compression fracture of L2 lumbar vertebra with delayed healing 05/29/2015   Frequent falls 03/26/2015   Atherosclerosis of arteries 03/26/2015   Family history of colon cancer 09/28/2014   TMJ (temporomandibular joint syndrome) 06/12/2014   Varicose veins of both lower extremities without ulcer or inflammation 06/12/2014   B12 deficiency 09/20/2013   Fatigue 05/23/2013   Internal hemorrhoids without complication 95/11/2255   Insomnia 03/03/2013   Vertigo due to cerebrovascular disease 03/03/2013   Seronegative rheumatoid arthritis affecting lower leg ([place]) 03/03/2013    Rosalyn Gess, OTR/L,CLT 10/23/2021, 11:12 AM  [facility] [address] [place], [address] Phone: [phone]   Fax:   [phone]  Name: Teresa Lin MRN: [id number] Date of Birth: 1931-12-08

## 2021-10-25 ENCOUNTER — Ambulatory Visit: Payer: Medicare Other | Admitting: Occupational Therapy

## 2021-10-25 DIAGNOSIS — M25521 Pain in right elbow: Secondary | ICD-10-CM | POA: Diagnosis not present

## 2021-10-25 DIAGNOSIS — M6281 Muscle weakness (generalized): Secondary | ICD-10-CM

## 2021-10-25 DIAGNOSIS — L905 Scar conditions and fibrosis of skin: Secondary | ICD-10-CM

## 2021-10-25 DIAGNOSIS — R600 Localized edema: Secondary | ICD-10-CM

## 2021-10-25 DIAGNOSIS — M25631 Stiffness of right wrist, not elsewhere classified: Secondary | ICD-10-CM

## 2021-10-25 DIAGNOSIS — M25531 Pain in right wrist: Secondary | ICD-10-CM

## 2021-10-25 NOTE — Therapy (Signed)
[facility] [address], [place], [address] Phone: [phone]   Fax:  [phone]  Occupational Therapy Treatment  Patient Details  Name: Teresa Lin MRN: [id number] Date of Birth: Feb 21, 1932 Referring Provider (OT): Dr Marcelino Scot   Encounter Date: 10/25/2021   OT End of Session - 10/25/21 0833     Visit Number 10    Number of Visits 16    Date for OT Re-Evaluation 11/15/21    OT Start Time 0815    OT Stop Time 0900    OT Time Calculation (min) 45 min    Activity Tolerance Patient tolerated treatment well    Behavior During Therapy [facility] for tasks assessed/performed             Past Medical History:  Diagnosis Date   Anemia    Anxiety    Arthritis    osteoarthritis. Bilateral knee replacement, hands, meniscal tear, cervical disc disease   Atherosclerosis    Atypical chest pain    Collagen vascular disease (HCC)    Colon polyp 12/15/2017   Colon polyps    Compression fracture of L2 (HCC)    Diverticulosis    DVT (deep venous thrombosis) (HCC)    Fracture of one rib, right side, subsequent encounter for fracture with routine healing 03/08/2017   GERD (gastroesophageal reflux disease)    History of seronegative inflammatory arthritis    HOH (hard of hearing)    no hearing aids   Hypertension    IBS (irritable bowel syndrome)    Internal hemorrhoids    Phlebitis and thrombophlebitis of deep veins of lower extremities ([place])    after surgery   Vision abnormalities     Past Surgical History:  Procedure Laterality Date   APPENDECTOMY     COLONOSCOPY N/A 01/24/2020   Procedure: COLONOSCOPY;  Surgeon: Lesly Rubenstein, MD;  Location: [facility] ENDOSCOPY;  Service: Endoscopy;  Laterality: N/A;   COLONOSCOPY WITH PROPOFOL N/A 10/24/2017   Procedure: COLONOSCOPY WITH PROPOFOL;  Surgeon: Manya Silvas, MD;  Location[facility] ENDOSCOPY;  Service: Endoscopy;  Laterality: N/A;   COLONOSCOPY WITH PROPOFOL  N/A 11/21/2017   Procedure: COLONOSCOPY WITH PROPOFOL;  Surgeon: Manya Silvas, MD;  Location: [facility] ENDOSCOPY;  Service: Endoscopy;  Laterality: N/A;   DILATION AND CURETTAGE OF UTERUS     ESOPHAGOGASTRODUODENOSCOPY (EGD) WITH PROPOFOL N/A 02/25/2018   Procedure: ESOPHAGOGASTRODUODENOSCOPY (EGD) WITH PROPOFOL;  Surgeon: Manya Silvas, MD;  Location: [facility] ENDOSCOPY;  Service: Endoscopy;  Laterality: N/A;   EYE SURGERY     bilateral catarACT   JOINT REPLACEMENT Bilateral    knee replacement   LAPAROSCOPIC RIGHT COLECTOMY Right 12/15/2017   Procedure: LAPAROSCOPIC RIGHT COLECTOMY;  Surgeon: Robert Bellow, MD;  Location: [facility] ORS;  Service: General;  Laterality: Right;   ORIF HUMERUS FRACTURE Right 08/09/2021   Procedure: IRRIGATION AND DEBRIDEMENT; OPEN REDUCTION INTERNAL FIXATION (ORIF) DISTAL HUMERUS FRACTURE;  Surgeon: Altamese Spiceland, MD;  Location: [place];  Service: Orthopedics;  Laterality: Right;   REPLACEMENT TOTAL KNEE BILATERAL     TOTAL SHOULDER ARTHROPLASTY Right 09/24/2018   Procedure: TOTAL SHOULDER ARTHROPLASTY - REVERSE RIGHT;  Surgeon: Corky Mull, MD;  Location: [facility] ORS;  Service: Orthopedics;  Laterality: Right;    There were no vitals filed for this visit.   Subjective Assessment - 10/25/21 0830     Subjective  I want to go over with you my exercises - no pain .  I am up  to date with arthrtiis medication    Pertinent History Pt is an 88 y.o. female admitted 08/08/21 after fall sustaining open R supracondylar distal humerus fx. S/p R humeral ORIF, ulnar nerve neuroplasty 3/30 by Dr Marcelino Scot. PMH includes HTN, IBS, DVT/PE, arthritis, anxiety, HOH, multiple joint replacements. Pt was in hospital recieving OT and PT followed by [facility]. Pt refer to OT with increase pain and swelling in R hand and wrist after fall. Had to stop her arthritis medication while when she had to had surgery.    Patient Stated Goals I am right-handed so I really need my elbow, wrist and hand.  I want to  get my motion and strength better in my right arm and hand, wrist so that I can do the things around the house that I need to do.    Currently in Pain? No/denies                [facility] OT Assessment - 10/25/21 0001       AROM   Right Elbow Extension 140                      OT Treatments/Exercises (OP) - 10/25/21 0001       RUE Paraffin   Number Minutes Paraffin 8 Minutes    RUE Paraffin Location Hand    Type --   wrist and elbow   Comments prior to ROM to decrease stiffnes               Elbow extension active assisted range of motion and passive range of motion for elbow extension and palm up.  10 reps hold 5 seconds each Elbow to side passive range of motion and active assisted range of motion for supination pronation 10 reps.  Was able to stay to get full supination with elbow extended and then facilitated elbow flexion and supination and the elbow extension.   Done 2 lbs for supination/pronation 12 reps   Was able to get active range of motion elbow extension -9 after paraffin and heating pad stretch with passive active range of motion  Patient asked to review and done  yellow band wrapped around right hand and left hand stabilize at right shoulder -patient to use palm not to grip Thera-Band as well as keeping right shoulder down -patient doing elbow extension with yellow Thera-Band  2 x 12 reps-patient to do twice a day following heat  Progressing very well in elbow extension Also done 2 lbs this date in supine - punching up 2 sets of 12 at home - add and elbow flexion to ear into elbow extension up to ceiling with keeping shoulder at 90 flexion-patient can use left hand to assist shoulder at 90 degree stabilization  In supine -2  lbs and 2 sets of 8 reps each  Pt to do at home 2 x 12 reps with 1 lbs    Done kinesiotaping to posterior scar that has some adhesions.  Done a 30% parallel pull 2 with 3 crossovers at 100% pull.  Patient to try and keep it on  for about 48 hours and then can continue with [employer] -[employer] scar pad at nighttime.    Continue the same wrist and finger exercises patient was close to baseline compared to when seen in the past prior to this fracture.   Reviewed again use of Isotoner glove mostly at nighttime.  If is too tight patient can stop wearing it but encouraged him to use her  [facility] neoprene during the day for thumb and radial wrist pain - to use with weight         OT Education - 10/25/21 0832     Education Details home exercises, scar massage, adjustments to weights    Person(s) Educated Patient    Methods Explanation;Demonstration;Tactile cues;Verbal cues;Handout    Comprehension Verbal cues required;Returned demonstration;Verbalized understanding              OT Short Term Goals - 09/20/21 1520       OT SHORT TERM GOAL #1   Title Pt to be independent in HEP to increase AROM in right elbow, wrist and hand  with less pain    Baseline Elbow extension -30, flexion 130, supination 75 with all pain increased endrange.  Wrist extension decreased as well as flexion compared to September last year    Time 3    Period Weeks    Status New    Target Date 10/11/21               OT Long Term Goals - 09/20/21 1521       OT LONG TERM GOAL #1   Title Right elbow flexion increased for patient to be able to put her hearing aids in and fix her hair with right dominant hand with pain less than 3/10    Baseline Patient report able to start eating a little bit with the right hand but limited with supination 75 degrees, elbow flexion 130 with pain    Time 4    Period Weeks    Status New    Target Date 10/18/21      OT LONG TERM GOAL #2   Title Right elbow extension increased with more than 15 degrees for patient to be able to reach in pocket and shelves without pain.    Baseline Elbow extension -30 degrees, supination 75 with pain at all end ranges.    Time 5    Period Weeks    Status New    Target Date  10/25/21      OT LONG TERM GOAL #3   Title Right elbow flexion extension increased for patient to be able to push heavy door open, turn a doorknob and carrying more than 5 pounds without increase symptoms    Baseline Patient 6-1/2 weeks postop pain with end range at -34 extension, 130 for flexion and supination at 75.  Pain and tenderness with swelling at wrist limited in wrist flexion extension.    Time 8    Period Weeks    Status New    Target Date 11/15/21      OT LONG TERM GOAL #4   Title Patient's pain, swelling and active range of motion increased to same than last year.    Baseline Right wrist extension and flexion decreased compared to September of last year as well as grip and prehension strength patient only 6-1/2 weeks postop.  Patient with pain with touch and tenderness over radial wrist and hand.    Time 8    Period Weeks    Status New    Target Date 11/15/21                   Plan - 10/25/21 0834     Clinical Impression Statement Patient presented OT evaluation with a diagnosis of her right elbow supracondylar fracture with ORIF on 08/09/2021 after a fall.  Patient also has a flareup of her rheumatoid arthritis symptoms in her right  hand and wrist because she had to stop her medication during surgery.  Patient at evaluation with scar adhesion at posterior elbow limiting her motion as well as increased swelling at elbow, wrist mostly over radial wrist as well as hand.  Patient reported increased pain and tenderness but no number provided.  Patient was seen by this OT in 2020 and 2021 and compared to those measurements patient did lose wrist active range of motion as well as some digit range of motion.  Since evaluation patient showed  decrease edema and decreased pain in wrist and digits with increased motion.  As well as increased extension at elbow with active assisted and passive range of motion by OT in session to -9 and flexion 140 this date.  Patient able to do 1  pound weight  at home and add last week and reviewed again  yellow Thera-Band for elbow extension  in supine increaes to 2 x 12 reps. Pt with increased pain with 2# weight but able to do 2 sets fof 8 in session - pt to do 1 lbs for elbow ext over head with shoulder 90 degrees flexion. Patient is right-hand dominant and present with increased scar tissu and pain with  with limited range of motion and decreased strength in right dominant arm limiting her functional use of right hand and ADLs and IADLs.  Patient can benefit from OT services to increase use of right dominant hand.    OT Occupational Profile and History Problem Focused Assessment - Including review of records relating to presenting problem    Occupational performance deficits (Please refer to evaluation for details): ADL's;IADL's;Play;Leisure;Social Participation    Body Structure / Function / Physical Skills ADL;Strength;Decreased knowledge of use of DME;Decreased knowledge of precautions;Flexibility;Scar mobility;ROM;IADL;Edema;UE functional use;Pain;Dexterity    Rehab Potential Good    Clinical Decision Making Limited treatment options, no task modification necessary    Comorbidities Affecting Occupational Performance: None    Modification or Assistance to Complete Evaluation  Min-Moderate modification of tasks or assist with assess necessary to complete eval    OT Frequency 2x / week    OT Duration 8 weeks    OT Treatment/Interventions Self-care/ADL training;Splinting;Therapeutic exercise;Patient/family education;DME and/or AE instruction;Contrast Bath;Scar mobilization;Cryotherapy;Paraffin;Manual Therapy;Passive range of motion    Consulted and Agree with Plan of Care Patient             Patient will benefit from skilled therapeutic intervention in order to improve the following deficits and impairments:   Body Structure / Function / Physical Skills: ADL, Strength, Decreased knowledge of use of DME, Decreased knowledge of  precautions, Flexibility, Scar mobility, ROM, IADL, Edema, UE functional use, Pain, Dexterity       Visit Diagnosis: Pain in right elbow  Scar condition and fibrosis of skin  Localized edema  Pain in right wrist  Stiffness of right wrist, not elsewhere classified  Muscle weakness (generalized)    Problem List Patient Active Problem List   Diagnosis Date Noted   Normocytic anemia 08/11/2021   Hypokalemia 08/09/2021   Displaced comminuted supracondylar fracture of right humerus without intercondylar fracture 08/08/2021   Open fracture of right humerus 08/08/2021   History of DVT (deep vein thrombosis) 06/06/2021   Rheumatoid arthritis of multiple sites without rheumatoid factor ([place]) 04/24/2021   History of pulmonary embolism 02/04/2020   History of blood clotting disorder 01/12/2020   Immunosuppression ([place]) 01/25/2019   Status post reverse total shoulder replacement, right 09/24/2018   Hyperglycemia 08/31/2018   S/P right colectomy  01/26/2018   Adenomatous polyp of ascending colon 11/24/2017   Generalized anxiety disorder 09/06/2017   Irritable bowel syndrome (IBS) 09/06/2017   Orthostasis 06/05/2016   Left shoulder pain 10/13/2015   Constipation 05/30/2015   Osteoporosis, postmenopausal 05/30/2015   Compression fracture of L2 lumbar vertebra with delayed healing 05/29/2015   Frequent falls 03/26/2015   Atherosclerosis of arteries 03/26/2015   Family history of colon cancer 09/28/2014   TMJ (temporomandibular joint syndrome) 06/12/2014   Varicose veins of both lower extremities without ulcer or inflammation 06/12/2014   B12 deficiency 09/20/2013   Fatigue 05/23/2013   Internal hemorrhoids without complication 89/37/3428   Insomnia 03/03/2013   Vertigo due to cerebrovascular disease 03/03/2013   Seronegative rheumatoid arthritis affecting lower leg ([place]) 03/03/2013    Rosalyn Gess, OTR/L,CLT 10/25/2021, 1:26 PM  [facility] [address], [place], [address] Phone: [phone]   Fax:  [phone]  Name: GLENISHA GUNDRY MRN: [id number] Date of Birth: 04-22-1932

## 2021-10-26 ENCOUNTER — Ambulatory Visit
Admission: RE | Admit: 2021-10-26 | Discharge: 2021-10-26 | Disposition: A | Discharge status: Home / Self Care | Payer: Medicare Other | Source: Ambulatory Visit | Attending: Internal Medicine | Admitting: Internal Medicine

## 2021-10-26 DIAGNOSIS — Z1231 Encounter for screening mammogram for malignant neoplasm of breast: Secondary | ICD-10-CM | POA: Diagnosis present

## 2021-10-30 ENCOUNTER — Ambulatory Visit: Payer: Medicare Other | Admitting: Occupational Therapy

## 2021-10-30 DIAGNOSIS — R600 Localized edema: Secondary | ICD-10-CM

## 2021-10-30 DIAGNOSIS — L905 Scar conditions and fibrosis of skin: Secondary | ICD-10-CM

## 2021-10-30 DIAGNOSIS — M25521 Pain in right elbow: Secondary | ICD-10-CM

## 2021-10-30 DIAGNOSIS — M6281 Muscle weakness (generalized): Secondary | ICD-10-CM

## 2021-10-30 DIAGNOSIS — M25531 Pain in right wrist: Secondary | ICD-10-CM

## 2021-10-30 DIAGNOSIS — M25631 Stiffness of right wrist, not elsewhere classified: Secondary | ICD-10-CM

## 2021-10-30 NOTE — Therapy (Signed)
[facility] [address], [place], [address] Phone: [phone]   Fax:  [phone]  Occupational Therapy Treatment  Patient Details  Name: Teresa Lin MRN: [id number] Date of Birth: May 21, 1931 Referring Provider (OT): Dr Marcelino Scot   Encounter Date: 10/30/2021   OT End of Session - 10/30/21 0903     Visit Number 11    Number of Visits 16    Date for OT Re-Evaluation 11/15/21    OT Start Time 0904    OT Stop Time 0953    OT Time Calculation (min) 49 min    Activity Tolerance Patient tolerated treatment well    Behavior During Therapy [facility] for tasks assessed/performed             Past Medical History:  Diagnosis Date   Anemia    Anxiety    Arthritis    osteoarthritis. Bilateral knee replacement, hands, meniscal tear, cervical disc disease   Atherosclerosis    Atypical chest pain    Collagen vascular disease (HCC)    Colon polyp 12/15/2017   Colon polyps    Compression fracture of L2 (HCC)    Diverticulosis    DVT (deep venous thrombosis) (HCC)    Fracture of one rib, right side, subsequent encounter for fracture with routine healing 03/08/2017   GERD (gastroesophageal reflux disease)    History of seronegative inflammatory arthritis    HOH (hard of hearing)    no hearing aids   Hypertension    IBS (irritable bowel syndrome)    Internal hemorrhoids    Phlebitis and thrombophlebitis of deep veins of lower extremities ([place])    after surgery   Vision abnormalities     Past Surgical History:  Procedure Laterality Date   APPENDECTOMY     COLONOSCOPY N/A 01/24/2020   Procedure: COLONOSCOPY;  Surgeon: Lesly Rubenstein, MD;  Location: [facility] ENDOSCOPY;  Service: Endoscopy;  Laterality: N/A;   COLONOSCOPY WITH PROPOFOL N/A 10/24/2017   Procedure: COLONOSCOPY WITH PROPOFOL;  Surgeon: Manya Silvas, MD;  Location: [facility] ENDOSCOPY;  Service: Endoscopy;  Laterality: N/A;   COLONOSCOPY WITH PROPOFOL  N/A 11/21/2017   Procedure: COLONOSCOPY WITH PROPOFOL;  Surgeon: Manya Silvas, MD;  Location: [facility] ENDOSCOPY;  Service: Endoscopy;  Laterality: N/A;   DILATION AND CURETTAGE OF UTERUS     ESOPHAGOGASTRODUODENOSCOPY (EGD) WITH PROPOFOL N/A 02/25/2018   Procedure: ESOPHAGOGASTRODUODENOSCOPY (EGD) WITH PROPOFOL;  Surgeon: Manya Silvas, MD;  Location: [facility] ENDOSCOPY;  Service: Endoscopy;  Laterality: N/A;   EYE SURGERY     bilateral catarACT   JOINT REPLACEMENT Bilateral    knee replacement   LAPAROSCOPIC RIGHT COLECTOMY Right 12/15/2017   Procedure: LAPAROSCOPIC RIGHT COLECTOMY;  Surgeon: Robert Bellow, MD;  Location: [facility] ORS;  Service: General;  Laterality: Right;   ORIF HUMERUS FRACTURE Right 08/09/2021   Procedure: IRRIGATION AND DEBRIDEMENT; OPEN REDUCTION INTERNAL FIXATION (ORIF) DISTAL HUMERUS FRACTURE;  Surgeon: Altamese Wheatland, MD;  Location: [place];  Service: Orthopedics;  Laterality: Right;   REPLACEMENT TOTAL KNEE BILATERAL     TOTAL SHOULDER ARTHROPLASTY Right 09/24/2018   Procedure: TOTAL SHOULDER ARTHROPLASTY - REVERSE RIGHT;  Surgeon: Corky Mull, MD;  Location: [facility] ORS;  Service: Orthopedics;  Laterality: Right;    There were no vitals filed for this visit.   Subjective Assessment - 10/30/21 0903     Subjective  I pulled my back on the right side do think I can talk to the doctor about  it.  I am seeing the surgeon tomorrow for my elbow.  Look I can bend better feet myself and put my earrings    Pertinent History Pt is an 88 y.o. female admitted 08/08/21 after fall sustaining open R supracondylar distal humerus fx. S/p R humeral ORIF, ulnar nerve neuroplasty 3/30 by Dr Marcelino Scot. PMH includes HTN, IBS, DVT/PE, arthritis, anxiety, HOH, multiple joint replacements. Pt was in hospital recieving OT and PT followed by [facility]. Pt refer to OT with increase pain and swelling in R hand and wrist after fall. Had to stop her arthritis medication while when she had to had surgery.     Patient Stated Goals I am right-handed so I really need my elbow, wrist and hand.  I want to get my motion and strength better in my right arm and hand, wrist so that I can do the things around the house that I need to do.    Currently in Pain? Yes    Pain Score 3     Pain Location Back   wrist R   Pain Orientation Upper;Right    Pain Descriptors / Indicators Tender;Tightness    Pain Type Acute pain;Chronic pain    Pain Onset 1 to 4 weeks ago    Pain Frequency Intermittent                [facility] OT Assessment - 10/30/21 0001       AROM   Right Elbow Flexion -22   in session -5   Right Elbow Extension 140    Right Forearm Pronation 90 Degrees    Right Forearm Supination 85 Degrees    Right Wrist Extension 30 Degrees    Right Wrist Flexion 40 Degrees    Right Wrist Radial Deviation 0 Degrees    Right Wrist Ulnar Deviation 28 Degrees      Strength   Right Hand Grip (lbs) 15    Right Hand Lateral Pinch 10 lbs    Right Hand 3 Point Pinch 6 lbs    Left Hand Grip (lbs) 31    Left Hand Lateral Pinch 9 lbs    Left Hand 3 Point Pinch 8 lbs               Her wrist and finger range of motion is back to prior level of function when seen in 20-21 Continues to have wrist pain with weights Encourage patient to use her CMC neoprene brace with her strengthening exercises to decrease pain with arthritis at the wrist and thumb CMC         OT Treatments/Exercises (OP) - 10/30/21 0001       RUE Paraffin   Number Minutes Paraffin 8 Minutes    RUE Paraffin Location Hand    Type --   elbow   Comments prior to ROM , decrease stiffness and pain             Elbow extension active assisted range of motion and passive range of motion for elbow extension and palm up.  10 reps hold 5 seconds each Elbow to side passive range of motion and active assisted range of motion for supination pronation 10 reps.  Was able to stay to get full supination with elbow extended and then  facilitated elbow flexion and supination and the elbow extension.   Done 2 lbs for supination/pronation 12 reps   Was able to get active range of motion elbow extension -5 after paraffin and heating pad stretch with passive active  range of motion  - tender posterior elbow Upgrade patient to red thera band wrapped around right hand and left hand stabilize at right shoulder -patient to use palm not to grip Thera-Band as well as keeping right shoulder down -patient doing elbow extension   2 x 12 reps-patient to do twice a day following heat  -patient to focus on endrange elbow extension Progressing very well in elbow extension Also done 2 lbs this date in supine - punching up 2 sets of 12 at home -removed from patient's home program because of patient straining with upper back causing some discomfort.  Decreased to 1 pound weight and patient to focus on elbow extension not shoulder or scapular protraction retraction.   and elbow flexion to ear into elbow extension up to ceiling with keeping shoulder at 90 flexion-patient can use left hand to assist shoulder at 90 degree stabilization  In supine 1 pound  2 sets of 8 reps each  Pt to do at home 2 x 12 reps with 1 lbs      Continue the same wrist and finger exercises patient was close to baseline compared to when seen in the past prior to this fracture.   Reviewed with patient and add to home program light blue putty for light gripping as well as three-point pinch reinforced for patient to stop when feeling a pull.  Can use CMC neoprene brace with her band, weight and prehension strength exercises to decrease pain. Putty 10 reps to 12 reps 2 times a day. Reviewed again use of Isotoner glove mostly at nighttime.  If is too tight patient can stop wearing it but encouraged him to use her CMC neoprene during the day for thumb and radial wrist pain - to use with weight           OT Education - 10/30/21 0903     Education Details home exercises, scar  massage, adjustments to weights    Person(s) Educated Patient    Methods Explanation;Demonstration;Tactile cues;Verbal cues;Handout    Comprehension Verbal cues required;Returned demonstration;Verbalized understanding              OT Short Term Goals - 10/30/21 1044       OT SHORT TERM GOAL #1   Title Pt to be independent in HEP to increase AROM in right elbow, wrist and hand  with less pain    Baseline Elbow extension -30, flexion 130, supination 75 with all pain increased endrange.  Wrist extension decreased as well as flexion compared to September last year  NOW ext -22 to -5 and flexion 140-pain still with PROM end range and weight    Status Achieved               OT Long Term Goals - 10/30/21 1044       OT LONG TERM GOAL #1   Title Right elbow flexion increased for patient to be able to put her hearing aids in and fix her hair with right dominant hand with pain less than 3/10    Baseline Patient report able to start eating a little bit with the right hand but limited with supination 75 degrees, elbow flexion 130 with pain  NOW flexion 140 able to eat and touch ear- pain still increase at elbow with 2 lbs    Time 4    Period Weeks    Status On-going    Target Date 11/27/21      OT LONG TERM GOAL #2   Title  Right elbow extension increased with more than 15 degrees for patient to be able to reach in pocket and shelves without pain.    Baseline Elbow extension -30 degrees, supination 75 with pain at all end ranges. NOW sup 85 , ext -22 to -5 degrees    Status Achieved      OT LONG TERM GOAL #3   Title Right elbow flexion extension increased for patient to be able to push heavy door open, turn a doorknob and carrying more than 5 pounds without increase symptoms    Baseline Patient 6-1/2 weeks postop pain with end range at -34 extension, 130 for flexion and supination at 75.  Pain and tenderness with swelling at wrist limited in wrist flexion extension.  NOW pain and hard  time 2 lbs weight elbow extention -    Time 4    Period Weeks    Status On-going    Target Date 11/27/21      OT LONG TERM GOAL #4   Title Patient's pain, swelling and active range of motion increased to same than last year.    Status Achieved    Target Date 11/27/21                   Plan - 10/30/21 3903     Clinical Impression Statement Patient presented OT evaluation with a diagnosis of her right elbow supracondylar fracture with ORIF on 08/09/2021 after a fall.   Since evaluation patient showed  decrease edema and decreased pain in wrist and digits with increased motion.  She started back since surgery her arthritis medication that helped her rheumatoid arthritis symptoms and pain.  Digit and wrist active range of motion return back to prior to full.  Grip and prehension strength is decreased with some pain.  Did start some light putty exercises this date but patient to use her CMC neoprene splint for prehension as well as weight and band home exercises.  Patient showed great progress in elbow extension and flexion coming in with -22 and improved to -5 elbow extension as well as flexion 140 able to feed herself and put jewelry in her ear..  Patient able to do 1 pound weight  at home and 2 pounds in the clinic.  Appear with patient using 2 pound weight with punches at home she is straining with her upper back and pulled a muscle.  Patient to focus on elbow extension and not shoulder and scapular protraction retraction.  Patient to hold off rest of this week with 2 pound weight.  Was able to upgrade her to a red Thera-Band for elbow extension  in supineo 2 x 12 reps. Patient is right-hand dominant and present with increased scar tissu and pain with  with limited range of motion and decreased strength in right dominant arm limiting her functional use of right hand and ADLs and IADLs.  Patient can benefit from OT services to increase use of right dominant hand.    OT Occupational Profile and  History Problem Focused Assessment - Including review of records relating to presenting problem    Occupational performance deficits (Please refer to evaluation for details): ADL's;IADL's;Play;Leisure;Social Participation    Body Structure / Function / Physical Skills ADL;Strength;Decreased knowledge of use of DME;Decreased knowledge of precautions;Flexibility;Scar mobility;ROM;IADL;Edema;UE functional use;Pain;Dexterity    Rehab Potential Good    Clinical Decision Making Limited treatment options, no task modification necessary    Comorbidities Affecting Occupational Performance: None    Modification or Assistance to Complete  Evaluation  Min-Moderate modification of tasks or assist with assess necessary to complete eval    OT Frequency 2x / week    OT Duration 8 weeks    OT Treatment/Interventions Self-care/ADL training;Splinting;Therapeutic exercise;Patient/family education;DME and/or AE instruction;Contrast Bath;Scar mobilization;Cryotherapy;Paraffin;Manual Therapy;Passive range of motion    Consulted and Agree with Plan of Care Patient             Patient will benefit from skilled therapeutic intervention in order to improve the following deficits and impairments:   Body Structure / Function / Physical Skills: ADL, Strength, Decreased knowledge of use of DME, Decreased knowledge of precautions, Flexibility, Scar mobility, ROM, IADL, Edema, UE functional use, Pain, Dexterity       Visit Diagnosis: Pain in right elbow  Scar condition and fibrosis of skin  Localized edema  Pain in right wrist  Stiffness of right wrist, not elsewhere classified  Muscle weakness (generalized)    Problem List Patient Active Problem List   Diagnosis Date Noted   Normocytic anemia 08/11/2021   Hypokalemia 08/09/2021   Displaced comminuted supracondylar fracture of right humerus without intercondylar fracture 08/08/2021   Open fracture of right humerus 08/08/2021   History of DVT (deep vein  thrombosis) 06/06/2021   Rheumatoid arthritis of multiple sites without rheumatoid factor ([place]) 04/24/2021   History of pulmonary embolism 02/04/2020   History of blood clotting disorder 01/12/2020   Immunosuppression ([place]) 01/25/2019   Status post reverse total shoulder replacement, right 09/24/2018   Hyperglycemia 08/31/2018   S/P right colectomy 01/26/2018   Adenomatous polyp of ascending colon 11/24/2017   Generalized anxiety disorder 09/06/2017   Irritable bowel syndrome (IBS) 09/06/2017   Orthostasis 06/05/2016   Left shoulder pain 10/13/2015   Constipation 05/30/2015   Osteoporosis, postmenopausal 05/30/2015   Compression fracture of L2 lumbar vertebra with delayed healing 05/29/2015   Frequent falls 03/26/2015   Atherosclerosis of arteries 03/26/2015   Family history of colon cancer 09/28/2014   TMJ (temporomandibular joint syndrome) 06/12/2014   Varicose veins of both lower extremities without ulcer or inflammation 06/12/2014   B12 deficiency 09/20/2013   Fatigue 05/23/2013   Internal hemorrhoids without complication 67/20/9470   Insomnia 03/03/2013   Vertigo due to cerebrovascular disease 03/03/2013   Seronegative rheumatoid arthritis affecting lower leg ([place]) 03/03/2013    Rosalyn Gess, OTR/l,CLT 10/30/2021, 10:48 AM  [facility] [address] [place], [address] Phone: [phone]   Fax:  [phone]  Name: Teresa Lin MRN: [id number] Date of Birth: [birth year]

## 2021-11-01 ENCOUNTER — Ambulatory Visit: Payer: Medicare Other | Admitting: Occupational Therapy

## 2021-11-01 DIAGNOSIS — M6281 Muscle weakness (generalized): Secondary | ICD-10-CM

## 2021-11-01 DIAGNOSIS — M25521 Pain in right elbow: Secondary | ICD-10-CM | POA: Diagnosis not present

## 2021-11-01 DIAGNOSIS — L905 Scar conditions and fibrosis of skin: Secondary | ICD-10-CM

## 2021-11-01 NOTE — Therapy (Signed)
[facility] [address] [place], [address] Phone: [phone]   Fax:  [phone]  Occupational Therapy Treatment  Patient Details  Name: Teresa Lin MRN: [id number] Date of Birth: [birth year] Referring Provider (OT): Dr Marcelino Scot   Encounter Date: 11/01/2021   OT End of Session - 11/01/21 1703     Visit Number 12    Number of Visits 16    Date for OT Re-Evaluation 11/15/21    OT Start Time 9211    OT Stop Time 1657    OT Time Calculation (min) 42 min    Activity Tolerance Patient tolerated treatment well    Behavior During Therapy [facility] for tasks assessed/performed             Past Medical History:  Diagnosis Date   Anemia    Anxiety    Arthritis    osteoarthritis. Bilateral knee replacement, hands, meniscal tear, cervical disc disease   Atherosclerosis    Atypical chest pain    Collagen vascular disease (HCC)    Colon polyp 12/15/2017   Colon polyps    Compression fracture of L2 (HCC)    Diverticulosis    DVT (deep venous thrombosis) (HCC)    Fracture of one rib, right side, subsequent encounter for fracture with routine healing 03/08/2017   GERD (gastroesophageal reflux disease)    History of seronegative inflammatory arthritis    HOH (hard of hearing)    no hearing aids   Hypertension    IBS (irritable bowel syndrome)    Internal hemorrhoids    Phlebitis and thrombophlebitis of deep veins of lower extremities ([place])    after surgery   Vision abnormalities     Past Surgical History:  Procedure Laterality Date   APPENDECTOMY     COLONOSCOPY N/A 01/24/2020   Procedure: COLONOSCOPY;  Surgeon: Lesly Rubenstein, MD;  Location: [facility] ENDOSCOPY;  Service: Endoscopy;  Laterality: N/A;   COLONOSCOPY WITH PROPOFOL N/A 10/24/2017   Procedure: COLONOSCOPY WITH PROPOFOL;  Surgeon: Manya Silvas, MD;  Location: [facility] ENDOSCOPY;  Service: Endoscopy;  Laterality: N/A;   COLONOSCOPY WITH PROPOFOL  N/A 11/21/2017   Procedure: COLONOSCOPY WITH PROPOFOL;  Surgeon: Manya Silvas, MD;  Location: [facility] ENDOSCOPY;  Service: Endoscopy;  Laterality: N/A;   DILATION AND CURETTAGE OF UTERUS     ESOPHAGOGASTRODUODENOSCOPY (EGD) WITH PROPOFOL N/A 02/25/2018   Procedure: ESOPHAGOGASTRODUODENOSCOPY (EGD) WITH PROPOFOL;  Surgeon: Manya Silvas, MD;  Location: [facility] ENDOSCOPY;  Service: Endoscopy;  Laterality: N/A;   EYE SURGERY     bilateral catarACT   JOINT REPLACEMENT Bilateral    knee replacement   LAPAROSCOPIC RIGHT COLECTOMY Right 12/15/2017   Procedure: LAPAROSCOPIC RIGHT COLECTOMY;  Surgeon: Robert Bellow, MD;  Location: [facility] ORS;  Service: General;  Laterality: Right;   ORIF HUMERUS FRACTURE Right 08/09/2021   Procedure: IRRIGATION AND DEBRIDEMENT; OPEN REDUCTION INTERNAL FIXATION (ORIF) DISTAL HUMERUS FRACTURE;  Surgeon: Altamese Oketo, MD;  Location: [place];  Service: Orthopedics;  Laterality: Right;   REPLACEMENT TOTAL KNEE BILATERAL     TOTAL SHOULDER ARTHROPLASTY Right 09/24/2018   Procedure: TOTAL SHOULDER ARTHROPLASTY - REVERSE RIGHT;  Surgeon: Corky Mull, MD;  Location: [facility] ORS;  Service: Orthopedics;  Laterality: Right;    There were no vitals filed for this visit.   Subjective Assessment - 11/01/21 1701     Subjective  I seen the orthopedist yesterday and he was so impressed with my elbow.  Said I was  doing great.  But my back was bothering me so what he told me it was just muscle maybe    Pertinent History Pt is an 88 y.o. female admitted 08/08/21 after fall sustaining open R supracondylar distal humerus fx. S/p R humeral ORIF, ulnar nerve neuroplasty 3/30 by Dr Marcelino Scot. PMH includes HTN, IBS, DVT/PE, arthritis, anxiety, HOH, multiple joint replacements. Pt was in hospital recieving OT and PT followed by [facility]. Pt refer to OT with increase pain and swelling in R hand and wrist after fall. Had to stop her arthritis medication while when she had to had surgery.    Patient Stated  Goals I am right-handed so I really need my elbow, wrist and hand.  I want to get my motion and strength better in my right arm and hand, wrist so that I can do the things around the house that I need to do.    Currently in Pain? Yes    Pain Score 2     Pain Location Wrist    Pain Orientation Right    Pain Descriptors / Indicators Tender;Aching    Pain Onset More than a month ago    Pain Frequency Intermittent                          OT Treatments/Exercises (OP) - 11/01/21 0001       RUE Paraffin   Number Minutes Paraffin 8 Minutes    RUE Paraffin Location Hand   wrist ,elbow   Comments Decrease stiffness and pain increase motion              Elbow extension active assisted range of motion and passive range of motion for elbow extension and palm up.  10 reps hold 5 seconds each Elbow to side passive range of motion and active assisted range of motion for supination pronation 10 reps.  Was able to stay to get full supination with elbow extended and then facilitated elbow flexion and supination and the elbow extension.   Done 2 lbs for supination/pronation 12 reps Had some discomfort with supination today with elbow flexion.   Was able to get active range of motion elbow extension -5 after paraffin and heating pad stretch with passive active range of motion  - tender posterior elbow Upgrade patient to red thera band  this week wrapped around right hand and left hand stabilize at right shoulder -patient to use palm not to grip Thera-Band as well as keeping right shoulder down -patient doing elbow extension   2 x 12 reps-patient to do twice a day following heat  -patient to focus on endrange elbow extension -reviewed several times videos recorded today to Progressing very well in elbow extension and flexion Patient to hold off on any 2 pound weight.     and elbow flexion to ear into elbow extension up to ceiling with keeping shoulder at 90 flexion-patient can use left  hand to assist shoulder at 90 degree stabilization  In supine 1 pound  2 sets of 8 reps each  Pt to do at home 2 x 12 reps with 1 lbs  Video recorded patient after many repetitions patient can use CMC neoprene for thumb pain and hand pain.     Continue the same wrist and finger exercises patient was close to baseline compared to when seen in the past prior to this fracture.   Reviewed with patient again light blue putty for light gripping as well as three-point  pinch reinforced for patient to stop when feeling a pull.  Can use CMC neoprene brace with her band, weight and prehension strength exercises to decrease pain. Putty 10 reps to 12 reps 2 times a day. Reviewed again use of Isotoner glove mostly at nighttime.  If is too tight patient can stop wearing it but encouraged him to use her CMC neoprene during the day for thumb and radial wrist pain - to use with weight                    OT Education - 11/01/21 1703     Education Details home exercises, scar massage, adjustments to weights    Person(s) Educated Patient    Methods Explanation;Demonstration;Tactile cues;Verbal cues;Handout    Comprehension Verbal cues required;Returned demonstration;Verbalized understanding              OT Short Term Goals - 10/30/21 1044       OT SHORT TERM GOAL #1   Title Pt to be independent in HEP to increase AROM in right elbow, wrist and hand  with less pain    Baseline Elbow extension -30, flexion 130, supination 75 with all pain increased endrange.  Wrist extension decreased as well as flexion compared to September last year  NOW ext -22 to -5 and flexion 140-pain still with PROM end range and weight    Status Achieved               OT Long Term Goals - 10/30/21 1044       OT LONG TERM GOAL #1   Title Right elbow flexion increased for patient to be able to put her hearing aids in and fix her hair with right dominant hand with pain less than 3/10    Baseline Patient report  able to start eating a little bit with the right hand but limited with supination 75 degrees, elbow flexion 130 with pain  NOW flexion 140 able to eat and touch ear- pain still increase at elbow with 2 lbs    Time 4    Period Weeks    Status On-going    Target Date 11/27/21      OT LONG TERM GOAL #2   Title Right elbow extension increased with more than 15 degrees for patient to be able to reach in pocket and shelves without pain.    Baseline Elbow extension -30 degrees, supination 75 with pain at all end ranges. NOW sup 85 , ext -22 to -5 degrees    Status Achieved      OT LONG TERM GOAL #3   Title Right elbow flexion extension increased for patient to be able to push heavy door open, turn a doorknob and carrying more than 5 pounds without increase symptoms    Baseline Patient 6-1/2 weeks postop pain with end range at -34 extension, 130 for flexion and supination at 75.  Pain and tenderness with swelling at wrist limited in wrist flexion extension.  NOW pain and hard time 2 lbs weight elbow extention -    Time 4    Period Weeks    Status On-going    Target Date 11/27/21      OT LONG TERM GOAL #4   Title Patient's pain, swelling and active range of motion increased to same than last year.    Status Achieved    Target Date 11/27/21  Plan - 11/01/21 1704     Clinical Impression Statement Patient presented OT evaluation with a diagnosis of her right elbow supracondylar fracture with ORIF on 08/09/2021 after a fall.   Since evaluation patient showed  decrease edema and decreased pain in wrist and digits with increased motion.  She started back since surgery her arthritis medication that helped her rheumatoid arthritis symptoms and pain.  Digit and wrist active range of motion return back to prior to full.  Grip and prehension strength is decreased with some pain.  Did start some light putty exercises - patient to use her CMC neoprene splint for prehension as well as  weight and band home exercises.  Had to reinforce several times today again.  Patient continues to show great progress in elbow extension and flexion.-Flexion is improving for patient to be able to do hearing aids, brushing teeth and eating with the right hand.    Patient able to do 1 pound weight  at home -but holding off on 2 pounds because of increased discomfort at upper back. patient compensating with shoulder.  Patient to focus on elbow extension and not shoulder and scapular protraction retraction.  Patient to hold off rest of this week with 2 pound weight.  Was able to upgrade her to a red Thera-Band for elbow extension  in supineo 2 x 12 reps this week. Patient is right-hand dominant and present with increased scar tissu and pain with  with limited range of motion and decreased strength in right dominant arm limiting her functional use of right hand and ADLs and IADLs.  Patient can benefit from OT services to increase use of right dominant hand.    OT Occupational Profile and History Problem Focused Assessment - Including review of records relating to presenting problem    Occupational performance deficits (Please refer to evaluation for details): ADL's;IADL's;Play;Leisure;Social Participation    Body Structure / Function / Physical Skills ADL;Strength;Decreased knowledge of use of DME;Decreased knowledge of precautions;Flexibility;Scar mobility;ROM;IADL;Edema;UE functional use;Pain;Dexterity    Rehab Potential Good    Clinical Decision Making Limited treatment options, no task modification necessary    Comorbidities Affecting Occupational Performance: None    Modification or Assistance to Complete Evaluation  Min-Moderate modification of tasks or assist with assess necessary to complete eval    OT Frequency 2x / week    OT Duration 8 weeks    OT Treatment/Interventions Self-care/ADL training;Splinting;Therapeutic exercise;Patient/family education;DME and/or AE instruction;Contrast Bath;Scar  mobilization;Cryotherapy;Paraffin;Manual Therapy;Passive range of motion    Consulted and Agree with Plan of Care Patient             Patient will benefit from skilled therapeutic intervention in order to improve the following deficits and impairments:   Body Structure / Function / Physical Skills: ADL, Strength, Decreased knowledge of use of DME, Decreased knowledge of precautions, Flexibility, Scar mobility, ROM, IADL, Edema, UE functional use, Pain, Dexterity       Visit Diagnosis: Pain in right elbow  Scar condition and fibrosis of skin  Muscle weakness (generalized)    Problem List Patient Active Problem List   Diagnosis Date Noted   Normocytic anemia 08/11/2021   Hypokalemia 08/09/2021   Displaced comminuted supracondylar fracture of right humerus without intercondylar fracture 08/08/2021   Open fracture of right humerus 08/08/2021   History of DVT (deep vein thrombosis) 06/06/2021   Rheumatoid arthritis of multiple sites without rheumatoid factor ([place]) 04/24/2021   History of pulmonary embolism 02/04/2020   History of blood clotting disorder 01/12/2020  Immunosuppression ([place]) 01/25/2019   Status post reverse total shoulder replacement, right 09/24/2018   Hyperglycemia 08/31/2018   S/P right colectomy 01/26/2018   Adenomatous polyp of ascending colon 11/24/2017   Generalized anxiety disorder 09/06/2017   Irritable bowel syndrome (IBS) 09/06/2017   Orthostasis 06/05/2016   Left shoulder pain 10/13/2015   Constipation 05/30/2015   Osteoporosis, postmenopausal 05/30/2015   Compression fracture of L2 lumbar vertebra with delayed healing 05/29/2015   Frequent falls 03/26/2015   Atherosclerosis of arteries 03/26/2015   Family history of colon cancer 09/28/2014   TMJ (temporomandibular joint syndrome) 06/12/2014   Varicose veins of both lower extremities without ulcer or inflammation 06/12/2014   B12 deficiency 09/20/2013   Fatigue 05/23/2013   Internal  hemorrhoids without complication 57/90/3833   Insomnia 03/03/2013   Vertigo due to cerebrovascular disease 03/03/2013   Seronegative rheumatoid arthritis affecting lower leg ([place]) 03/03/2013    Rosalyn Gess, OTR/L,CLT 11/01/2021, 5:17 PM  [facility] [address] [place], [address] Phone: [phone]   Fax:  [phone]  Name: Teresa Lin MRN: [id number] Date of Birth: 02-Sep-1931

## 2021-11-06 ENCOUNTER — Ambulatory Visit: Payer: Medicare Other | Admitting: Occupational Therapy

## 2021-11-06 DIAGNOSIS — M25531 Pain in right wrist: Secondary | ICD-10-CM

## 2021-11-06 DIAGNOSIS — M6281 Muscle weakness (generalized): Secondary | ICD-10-CM

## 2021-11-06 DIAGNOSIS — M25521 Pain in right elbow: Secondary | ICD-10-CM | POA: Diagnosis not present

## 2021-11-06 DIAGNOSIS — R600 Localized edema: Secondary | ICD-10-CM

## 2021-11-06 DIAGNOSIS — L905 Scar conditions and fibrosis of skin: Secondary | ICD-10-CM

## 2021-11-06 DIAGNOSIS — M25631 Stiffness of right wrist, not elsewhere classified: Secondary | ICD-10-CM

## 2021-11-08 ENCOUNTER — Ambulatory Visit: Payer: Medicare Other | Admitting: Occupational Therapy

## 2021-11-08 DIAGNOSIS — M25521 Pain in right elbow: Secondary | ICD-10-CM | POA: Diagnosis not present

## 2021-11-08 DIAGNOSIS — L905 Scar conditions and fibrosis of skin: Secondary | ICD-10-CM

## 2021-11-08 DIAGNOSIS — M25631 Stiffness of right wrist, not elsewhere classified: Secondary | ICD-10-CM

## 2021-11-08 DIAGNOSIS — M25531 Pain in right wrist: Secondary | ICD-10-CM

## 2021-11-08 DIAGNOSIS — M6281 Muscle weakness (generalized): Secondary | ICD-10-CM

## 2021-11-08 NOTE — Therapy (Signed)
[facility] [address], [place], [address] Phone: [phone]   Fax:  [phone]  Occupational Therapy Treatment  Patient Details  Name: Teresa Lin MRN: [id number] Date of Birth: Sep 03, 1931 Referring Provider (OT): Dr Marcelino Scot   Encounter Date: 11/08/2021   OT End of Session - 11/08/21 0834     Visit Number 14    Number of Visits 16    Date for OT Re-Evaluation 11/15/21    OT Start Time 0822    OT Stop Time 0902    OT Time Calculation (min) 40 min    Activity Tolerance Patient tolerated treatment well    Behavior During Therapy [facility] for tasks assessed/performed             Past Medical History:  Diagnosis Date   Anemia    Anxiety    Arthritis    osteoarthritis. Bilateral knee replacement, hands, meniscal tear, cervical disc disease   Atherosclerosis    Atypical chest pain    Collagen vascular disease (HCC)    Colon polyp 12/15/2017   Colon polyps    Compression fracture of L2 (HCC)    Diverticulosis    DVT (deep venous thrombosis) (HCC)    Fracture of one rib, right side, subsequent encounter for fracture with routine healing 03/08/2017   GERD (gastroesophageal reflux disease)    History of seronegative inflammatory arthritis    HOH (hard of hearing)    no hearing aids   Hypertension    IBS (irritable bowel syndrome)    Internal hemorrhoids    Phlebitis and thrombophlebitis of deep veins of lower extremities ([place])    after surgery   Vision abnormalities     Past Surgical History:  Procedure Laterality Date   APPENDECTOMY     COLONOSCOPY N/A 01/24/2020   Procedure: COLONOSCOPY;  Surgeon: Lesly Rubenstein, MD;  Location: [facility] ENDOSCOPY;  Service: Endoscopy;  Laterality: N/A;   COLONOSCOPY WITH PROPOFOL N/A 10/24/2017   Procedure: COLONOSCOPY WITH PROPOFOL;  Surgeon: Manya Silvas, MD;  Location: [facility] ENDOSCOPY;  Service: Endoscopy;  Laterality: N/A;   COLONOSCOPY WITH PROPOFOL  N/A 11/21/2017   Procedure: COLONOSCOPY WITH PROPOFOL;  Surgeon: Manya Silvas, MD;  Location: [facility] ENDOSCOPY;  Service: Endoscopy;  Laterality: N/A;   DILATION AND CURETTAGE OF UTERUS     ESOPHAGOGASTRODUODENOSCOPY (EGD) WITH PROPOFOL N/A 02/25/2018   Procedure: ESOPHAGOGASTRODUODENOSCOPY (EGD) WITH PROPOFOL;  Surgeon: Manya Silvas, MD;  Location: [facility] ENDOSCOPY;  Service: Endoscopy;  Laterality: N/A;   EYE SURGERY     bilateral catarACT   JOINT REPLACEMENT Bilateral    knee replacement   LAPAROSCOPIC RIGHT COLECTOMY Right 12/15/2017   Procedure: LAPAROSCOPIC RIGHT COLECTOMY;  Surgeon: Robert Bellow, MD;  Location: [facility] ORS;  Service: General;  Laterality: Right;   ORIF HUMERUS FRACTURE Right 08/09/2021   Procedure: IRRIGATION AND DEBRIDEMENT; OPEN REDUCTION INTERNAL FIXATION (ORIF) DISTAL HUMERUS FRACTURE;  Surgeon: Altamese [place], MD;  Location: [place];  Service: Orthopedics;  Laterality: Right;   REPLACEMENT TOTAL KNEE BILATERAL     TOTAL SHOULDER ARTHROPLASTY Right 09/24/2018   Procedure: TOTAL SHOULDER ARTHROPLASTY - REVERSE RIGHT;  Surgeon: Corky Mull, MD;  Location: [facility] ORS;  Service: Orthopedics;  Laterality: Right;    There were no vitals filed for this visit.   Subjective Assessment - 11/08/21 0833     Subjective  I do not know if I told you but they changing arthritis medication.  Because my insurance.  Feel like doing okay, getting a little more strength    Pertinent History Pt is an 88 y.o. female admitted 08/08/21 after fall sustaining open R supracondylar distal humerus fx. S/p R humeral ORIF, ulnar nerve neuroplasty 3/30 by Dr Marcelino Scot. PMH includes HTN, IBS, DVT/PE, arthritis, anxiety, HOH, multiple joint replacements. Pt was in hospital recieving OT and PT followed by [facility]. Pt refer to OT with increase pain and swelling in R hand and wrist after fall. Had to stop her arthritis medication while when she had to had surgery.    Patient Stated Goals I am right-handed so I  really need my elbow, wrist and hand.  I want to get my motion and strength better in my right arm and hand, wrist so that I can do the things around the house that I need to do.    Currently in Pain? No/denies                          OT Treatments/Exercises (OP) - 11/08/21 0001       RUE Paraffin   Number Minutes Paraffin 8 Minutes    RUE Paraffin Location Hand    Type --   Elbow   Comments Increased elbow extension and flexion with decreased pain             OT focusing on elbow extension during session after paraffin with heating pad in combination for elbow extension stretch.   Contract relax and range of motion with passive range of motion endrange.  As well as in supination position Isometric strengthening in endrange by OT 10-12 reps.    Was able to get active range of motion elbow extension -5 after paraffin and heating pad stretch with passive active range of motion   Upgrade patient last week to red thera band   wrapped around right hand and left hand stabilize at right shoulder -patient to use palm not to grip Thera-Band as well as keeping right shoulder down -patient doing elbow extension   2 x 12 reps-patient to do twice a day following heat  -patient to focus on endrange elbow extension -reviewed several times videos recorded last week Progressing very well in elbow extension and flexion Initiated this date 2 pound weight for elbow extension against gravity with shoulder at 90 degrees.  Was able to do 2 sets of 5 reporting it being very hard.   Reviewed with patient interlocking hands for elbow flexion sliding behind hand patient to do several times during the day for wraps holding 30 seconds. Followed by interlocking hands and doing elbow flexion trying to touch left ear and the right ear 10-15 reps.   Done this date with patient on Biodex bilateral elbow extension with 5 pounds 2x12 reps.  Attempted 10 pounds, but was having too much pain at right  wrist and thumb because of her arthritis. UBE done 5 minutes with 3-resistance changing directions forward and back 2 minutes each   continue the same wrist and finger exercises patient was close to baseline compared to when seen in the past prior to this fracture.   Patient to continue with light blue putty for light gripping as well as three-point pinch reinforced for patient to stop when feeling a pull.  Can use CMC neoprene brace with her band, weight and prehension strength exercises to decrease pain. Putty 10 reps to 12 reps 2 times a day.  OT Education - 11/08/21 1643     Education Details home exercises, scar massage, adjustments to weights    Person(s) Educated Patient    Methods Explanation;Demonstration;Tactile cues;Verbal cues;Handout    Comprehension Verbal cues required;Returned demonstration;Verbalized understanding              OT Short Term Goals - 10/30/21 1044       OT SHORT TERM GOAL #1   Title Pt to be independent in HEP to increase AROM in right elbow, wrist and hand  with less pain    Baseline Elbow extension -30, flexion 130, supination 75 with all pain increased endrange.  Wrist extension decreased as well as flexion compared to September last year  NOW ext -22 to -5 and flexion 140-pain still with PROM end range and weight    Status Achieved               OT Long Term Goals - 10/30/21 1044       OT LONG TERM GOAL #1   Title Right elbow flexion increased for patient to be able to put her hearing aids in and fix her hair with right dominant hand with pain less than 3/10    Baseline Patient report able to start eating a little bit with the right hand but limited with supination 75 degrees, elbow flexion 130 with pain  NOW flexion 140 able to eat and touch ear- pain still increase at elbow with 2 lbs    Time 4    Period Weeks    Status On-going    Target Date 11/27/21      OT LONG TERM GOAL #2   Title Right elbow extension  increased with more than 15 degrees for patient to be able to reach in pocket and shelves without pain.    Baseline Elbow extension -30 degrees, supination 75 with pain at all end ranges. NOW sup 85 , ext -22 to -5 degrees    Status Achieved      OT LONG TERM GOAL #3   Title Right elbow flexion extension increased for patient to be able to push heavy door open, turn a doorknob and carrying more than 5 pounds without increase symptoms    Baseline Patient 6-1/2 weeks postop pain with end range at -34 extension, 130 for flexion and supination at 75.  Pain and tenderness with swelling at wrist limited in wrist flexion extension.  NOW pain and hard time 2 lbs weight elbow extention -    Time 4    Period Weeks    Status On-going    Target Date 11/27/21      OT LONG TERM GOAL #4   Title Patient's pain, swelling and active range of motion increased to same than last year.    Status Achieved    Target Date 11/27/21                   Plan - 11/08/21 0834     Clinical Impression Statement Patient presented OT evaluation with a diagnosis of her right elbow supracondylar fracture with ORIF on 08/09/2021 after a fall.   Since evaluation patient showed  decrease edema and decreased pain in wrist and digits with increased motion.  She started back since surgery her arthritis medication that helped her rheumatoid arthritis symptoms and pain.  Digit and wrist active range of motion return back to prior to full.  Since last visit patient showed increased grip and prehension strength as well strength in right  upper extremity.  Encourage patient to use her CMC neoprene splint for prehension as well as weight and band home exercises.  Had to reinforce several times today again.  Patient continues to show great progress in elbow extension and flexion range of motion.Flexion is improving for patient to be able to do hearing aids, brushing teeth and eating with the right hand.    Was able to upgrade her to a red  Thera-Band for elbow extension  in supineo 2 x 12 reps last  week.  And initiated 2 pound weight again this date for elbow extension but could only do 2-3 sets of 5 reps reporting it being very hard.  Patient is limited in her strengthening by her arthritis pain at right wrist and thumb. Patient is right-hand dominant and present with increased scar tissu and pain with  with limited range of motion and decreased strength in right dominant arm limiting her functional use of right hand and ADLs and IADLs.  Patient can benefit from OT services to increase use of right dominant hand.    OT Occupational Profile and History Problem Focused Assessment - Including review of records relating to presenting problem    Occupational performance deficits (Please refer to evaluation for details): ADL's;IADL's;Play;Leisure;Social Participation    Body Structure / Function / Physical Skills ADL;Strength;Decreased knowledge of use of DME;Decreased knowledge of precautions;Flexibility;Scar mobility;ROM;IADL;Edema;UE functional use;Pain;Dexterity    Rehab Potential Good    Clinical Decision Making Limited treatment options, no task modification necessary    Comorbidities Affecting Occupational Performance: None    Modification or Assistance to Complete Evaluation  Min-Moderate modification of tasks or assist with assess necessary to complete eval    OT Frequency 2x / week    OT Duration 8 weeks    OT Treatment/Interventions Self-care/ADL training;Splinting;Therapeutic exercise;Patient/family education;DME and/or AE instruction;Contrast Bath;Scar mobilization;Cryotherapy;Paraffin;Manual Therapy;Passive range of motion    Consulted and Agree with Plan of Care Patient             Patient will benefit from skilled therapeutic intervention in order to improve the following deficits and impairments:   Body Structure / Function / Physical Skills: ADL, Strength, Decreased knowledge of use of DME, Decreased knowledge of  precautions, Flexibility, Scar mobility, ROM, IADL, Edema, UE functional use, Pain, Dexterity       Visit Diagnosis: Pain in right elbow  Scar condition and fibrosis of skin  Muscle weakness (generalized)  Pain in right wrist  Stiffness of right wrist, not elsewhere classified    Problem List Patient Active Problem List   Diagnosis Date Noted   Normocytic anemia 08/11/2021   Hypokalemia 08/09/2021   Displaced comminuted supracondylar fracture of right humerus without intercondylar fracture 08/08/2021   Open fracture of right humerus 08/08/2021   History of DVT (deep vein thrombosis) 06/06/2021   Rheumatoid arthritis of multiple sites without rheumatoid factor ([place]) 04/24/2021   History of pulmonary embolism 02/04/2020   History of blood clotting disorder 01/12/2020   Immunosuppression ([place]) 01/25/2019   Status post reverse total shoulder replacement, right 09/24/2018   Hyperglycemia 08/31/2018   S/P right colectomy 01/26/2018   Adenomatous polyp of ascending colon 11/24/2017   Generalized anxiety disorder 09/06/2017   Irritable bowel syndrome (IBS) 09/06/2017   Orthostasis 06/05/2016   Left shoulder pain 10/13/2015   Constipation 05/30/2015   Osteoporosis, postmenopausal 05/30/2015   Compression fracture of L2 lumbar vertebra with delayed healing 05/29/2015   Frequent falls 03/26/2015   Atherosclerosis of arteries 03/26/2015   Family history  of colon cancer 09/28/2014   TMJ (temporomandibular joint syndrome) 06/12/2014   Varicose veins of both lower extremities without ulcer or inflammation 06/12/2014   B12 deficiency 09/20/2013   Fatigue 05/23/2013   Internal hemorrhoids without complication 33/29/5188   Insomnia 03/03/2013   Vertigo due to cerebrovascular disease 03/03/2013   Seronegative rheumatoid arthritis affecting lower leg ([place]) 03/03/2013    Rosalyn Gess, OTR/L,CLT 11/08/2021, 4:46 PM  [facility] [address] [place], [address] Phone: [phone]   Fax:  [phone]  Name: Teresa Lin MRN: [id number] Date of Birth: 1931-06-18

## 2021-11-15 ENCOUNTER — Ambulatory Visit: Payer: Medicare Other | Attending: Orthopedic Surgery | Admitting: Occupational Therapy

## 2021-11-15 DIAGNOSIS — R600 Localized edema: Secondary | ICD-10-CM | POA: Insufficient documentation

## 2021-11-15 DIAGNOSIS — M25642 Stiffness of left hand, not elsewhere classified: Secondary | ICD-10-CM | POA: Diagnosis present

## 2021-11-15 DIAGNOSIS — M79641 Pain in right hand: Secondary | ICD-10-CM | POA: Diagnosis present

## 2021-11-15 DIAGNOSIS — M25641 Stiffness of right hand, not elsewhere classified: Secondary | ICD-10-CM | POA: Insufficient documentation

## 2021-11-15 DIAGNOSIS — M25631 Stiffness of right wrist, not elsewhere classified: Secondary | ICD-10-CM | POA: Insufficient documentation

## 2021-11-15 DIAGNOSIS — M6281 Muscle weakness (generalized): Secondary | ICD-10-CM | POA: Diagnosis present

## 2021-11-15 DIAGNOSIS — M79642 Pain in left hand: Secondary | ICD-10-CM | POA: Diagnosis present

## 2021-11-15 DIAGNOSIS — L905 Scar conditions and fibrosis of skin: Secondary | ICD-10-CM | POA: Diagnosis present

## 2021-11-15 DIAGNOSIS — M25531 Pain in right wrist: Secondary | ICD-10-CM | POA: Insufficient documentation

## 2021-11-15 DIAGNOSIS — M25521 Pain in right elbow: Secondary | ICD-10-CM | POA: Insufficient documentation

## 2021-11-15 NOTE — Therapy (Signed)
[facility] [address], [place], [address] Phone: [phone]   Fax:  [phone]  Occupational Therapy Treatment  Patient Details  Name: Teresa Lin MRN: [id number] Date of Birth: 02-25-1932 Referring Provider (OT): Dr Marcelino Scot   Encounter Date: 11/15/2021   OT End of Session - 11/15/21 1302     Visit Number 15    Number of Visits 16    Date for OT Re-Evaluation 11/15/21    OT Start Time 0946    OT Stop Time 1028    OT Time Calculation (min) 42 min    Activity Tolerance Patient tolerated treatment well    Behavior During Therapy [facility] for tasks assessed/performed             Past Medical History:  Diagnosis Date   Anemia    Anxiety    Arthritis    osteoarthritis. Bilateral knee replacement, hands, meniscal tear, cervical disc disease   Atherosclerosis    Atypical chest pain    Collagen vascular disease (HCC)    Colon polyp 12/15/2017   Colon polyps    Compression fracture of L2 (HCC)    Diverticulosis    DVT (deep venous thrombosis) (HCC)    Fracture of one rib, right side, subsequent encounter for fracture with routine healing 03/08/2017   GERD (gastroesophageal reflux disease)    History of seronegative inflammatory arthritis    HOH (hard of hearing)    no hearing aids   Hypertension    IBS (irritable bowel syndrome)    Internal hemorrhoids    Phlebitis and thrombophlebitis of deep veins of lower extremities ([place])    after surgery   Vision abnormalities     Past Surgical History:  Procedure Laterality Date   APPENDECTOMY     COLONOSCOPY N/A 01/24/2020   Procedure: COLONOSCOPY;  Surgeon: Teresa Rubenstein, MD;  Location: [facility] ENDOSCOPY;  Service: Endoscopy;  Laterality: N/A;   COLONOSCOPY WITH PROPOFOL N/A 10/24/2017   Procedure: COLONOSCOPY WITH PROPOFOL;  Surgeon: Teresa Silvas, MD;  Location: [facility] ENDOSCOPY;  Service: Endoscopy;  Laterality: N/A;   COLONOSCOPY WITH PROPOFOL  N/A 11/21/2017   Procedure: COLONOSCOPY WITH PROPOFOL;  Surgeon: Teresa Silvas, MD;  Location: [facility] ENDOSCOPY;  Service: Endoscopy;  Laterality: N/A;   DILATION AND CURETTAGE OF UTERUS     ESOPHAGOGASTRODUODENOSCOPY (EGD) WITH PROPOFOL N/A 02/25/2018   Procedure: ESOPHAGOGASTRODUODENOSCOPY (EGD) WITH PROPOFOL;  Surgeon: Teresa Silvas, MD;  Location: [facility] ENDOSCOPY;  Service: Endoscopy;  Laterality: N/A;   EYE SURGERY     bilateral catarACT   JOINT REPLACEMENT Bilateral    knee replacement   LAPAROSCOPIC RIGHT COLECTOMY Right 12/15/2017   Procedure: LAPAROSCOPIC RIGHT COLECTOMY;  Surgeon: Teresa Bellow, MD;  Location: [facility] ORS;  Service: General;  Laterality: Right;   ORIF HUMERUS FRACTURE Right 08/09/2021   Procedure: IRRIGATION AND DEBRIDEMENT; OPEN REDUCTION INTERNAL FIXATION (ORIF) DISTAL HUMERUS FRACTURE;  Surgeon: Teresa Chamberlayne, MD;  Location: [facility];  Service: Orthopedics;  Laterality: Right;   REPLACEMENT TOTAL KNEE BILATERAL     TOTAL SHOULDER ARTHROPLASTY Right 09/24/2018   Procedure: TOTAL SHOULDER ARTHROPLASTY - REVERSE RIGHT;  Surgeon: Teresa Mull, MD;  Location: [facility] ORS;  Service: Orthopedics;  Laterality: Right;    There were no vitals filed for this visit.   Subjective Assessment - 11/15/21 1300     Subjective  I want you to go over my exercises again I brought my band and my weights.  Still having a hard time getting my elbow to bend to fix my hair, using the phone and raking his hard.  But otherwise I can do laundry, cooking, making the bed.    Pertinent History Pt is an 88 y.o. female admitted 08/08/21 after fall sustaining open R supracondylar distal humerus fx. S/p R humeral ORIF, ulnar nerve neuroplasty 3/30 by Dr Marcelino Scot. PMH includes HTN, IBS, DVT/PE, arthritis, anxiety, HOH, multiple joint replacements. Pt was in hospital recieving OT and PT followed by [facility]. Pt refer to OT with increase pain and swelling in R hand and wrist after fall. Had to stop her arthritis  medication while when she had to had surgery.    Patient Stated Goals I am right-handed so I really need my elbow, wrist and hand.  I want to get my motion and strength better in my right arm and hand, wrist so that I can do the things around the house that I need to do.    Currently in Pain? No/denies                [facility] OT Assessment - 11/15/21 0001       AROM   Right Elbow Flexion --   -10   Right Elbow Extension 140                      OT Treatments/Exercises (OP) - 11/15/21 0001       RUE Paraffin   Number Minutes Paraffin 8 Minutes    RUE Paraffin Location Hand    Type --   Wrist and elbow   Comments Increased elbow extension and flexion after heat with a moist heating pad            OT focusing on elbow extension during session after paraffin with heating pad in combination for elbow extension stretch.   Able to get full extension in session.    Reviewed with patient again 2 sets of elbow extension in supine with red thera band  - wrapped around right hand and left hand stabilize at right shoulder -patient to use palm not to grip Thera-Band as well as keeping right shoulder down -patient doing elbow extension    -reviewed several times  -and videos were ordered in the past   Progressing very well in elbow extension and flexion Initiated last wk  2 pound weight for elbow extension against gravity with shoulder at 90 degrees.  Was able to do 2 sets of 5 reporting it being very hard.   Patient to do 1 set of 2 pound weight for 5-6 reps and then continue with 1 pound weight Done in clinic 2 sets of 12 pain-free. Needed handout and review again this date Reviewed with patient interlocking hands for elbow flexion sliding behind head- patient to do several times during the day for wraps holding 30 seconds. Followed by interlocking hands and doing elbow flexion trying to touch left ear and the right ear 10-15 reps.   Done this date with patient on [employer]  bilateral elbow extension with 10 pounds 2x12 reps.  Reviewed with patient ended a handout for tendon glides as well as opposition, wrist extension flexion and supination pronation Did add to home program 1 pound for supination pronation 2 sets of 12     Patient to continue with light blue putty for light gripping as well as three-point pinch reinforced for patient to stop when feeling a pull.  Can use CMC neoprene brace with  her band, weight and prehension strength exercises to decrease pain. Redone patient handout and reviewed again all home exercises per patient request.                 OT Education - 11/15/21 1302     Education Details Progress and changes to home program    Person(s) Educated Patient    Methods Explanation;Demonstration;Tactile cues;Verbal cues;Handout    Comprehension Verbal cues required;Returned demonstration;Verbalized understanding              OT Short Term Goals - 10/30/21 1044       OT SHORT TERM GOAL #1   Title Pt to be independent in HEP to increase AROM in right elbow, wrist and hand  with less pain    Baseline Elbow extension -30, flexion 130, supination 75 with all pain increased endrange.  Wrist extension decreased as well as flexion compared to September last year  NOW ext -22 to -5 and flexion 140-pain still with PROM end range and weight    Status Achieved               OT Long Term Goals - 10/30/21 1044       OT LONG TERM GOAL #1   Title Right elbow flexion increased for patient to be able to put her hearing aids in and fix her hair with right dominant hand with pain less than 3/10    Baseline Patient report able to start eating a little bit with the right hand but limited with supination 75 degrees, elbow flexion 130 with pain  NOW flexion 140 able to eat and touch ear- pain still increase at elbow with 2 lbs    Time 4    Period Weeks    Status On-going    Target Date 11/27/21      OT LONG TERM GOAL #2   Title Right  elbow extension increased with more than 15 degrees for patient to be able to reach in pocket and shelves without pain.    Baseline Elbow extension -30 degrees, supination 75 with pain at all end ranges. NOW sup 85 , ext -22 to -5 degrees    Status Achieved      OT LONG TERM GOAL #3   Title Right elbow flexion extension increased for patient to be able to push heavy door open, turn a doorknob and carrying more than 5 pounds without increase symptoms    Baseline Patient 6-1/2 weeks postop pain with end range at -34 extension, 130 for flexion and supination at 75.  Pain and tenderness with swelling at wrist limited in wrist flexion extension.  NOW pain and hard time 2 lbs weight elbow extention -    Time 4    Period Weeks    Status On-going    Target Date 11/27/21      OT LONG TERM GOAL #4   Title Patient's pain, swelling and active range of motion increased to same than last year.    Status Achieved    Target Date 11/27/21                   Plan - 11/15/21 1303     Clinical Impression Statement Patient presented OT evaluation with a diagnosis of her right elbow supracondylar fracture with ORIF on 08/09/2021 after a fall.   Since evaluation patient showed  decrease edema and decreased pain in wrist and digits with increased motion.  She started back since surgery her arthritis medication that  helped her rheumatoid arthritis symptoms and pain.  Digit and wrist active range of motion return back to prior to full.  Patient continues to show increased motion as well as strength in right upper extremity with increased functional use..  Encourage patient to use her CMC neoprene splint for prehension as well as weight and band home exercises.  Had to reinforce several times today again.  Had to review with patient's home exercises again as well as providing a new written handout.  Reinforced red Thera-Band for elbow extension in supine as well as 1 and 2 pound weight for elbow extension flexion  overhead in supine.  But patient with increased functional use of right dominant hand mostly limited with fixing her hair as well as raking outside patient is limited in her strengthening by her arthritis pain at right wrist and thumb. Patient is right-hand dominant and present with increased scar tissu and pain with  with limited range of motion and decreased strength in right dominant arm limiting her functional use of right hand and ADLs and IADLs.  Patient can benefit from OT services to increase use of right dominant hand.    OT Occupational Profile and History Problem Focused Assessment - Including review of records relating to presenting problem    Occupational performance deficits (Please refer to evaluation for details): ADL's;IADL's;Play;Leisure;Social Participation    Body Structure / Function / Physical Skills ADL;Strength;Decreased knowledge of use of DME;Decreased knowledge of precautions;Flexibility;Scar mobility;ROM;IADL;Edema;UE functional use;Pain;Dexterity    Rehab Potential Good    Clinical Decision Making Limited treatment options, no task modification necessary    Comorbidities Affecting Occupational Performance: None    Modification or Assistance to Complete Evaluation  Min-Moderate modification of tasks or assist with assess necessary to complete eval    OT Frequency 2x / week    OT Duration 8 weeks    OT Treatment/Interventions Self-care/ADL training;Splinting;Therapeutic exercise;Patient/family education;DME and/or AE instruction;Contrast Bath;Scar mobilization;Cryotherapy;Paraffin;Manual Therapy;Passive range of motion    Consulted and Agree with Plan of Care Patient             Patient will benefit from skilled therapeutic intervention in order to improve the following deficits and impairments:   Body Structure / Function / Physical Skills: ADL, Strength, Decreased knowledge of use of DME, Decreased knowledge of precautions, Flexibility, Scar mobility, ROM, IADL,  Edema, UE functional use, Pain, Dexterity       Visit Diagnosis: Pain in right elbow  Scar condition and fibrosis of skin  Muscle weakness (generalized)  Stiffness of right wrist, not elsewhere classified  Pain in right wrist    Problem List Patient Active Problem List   Diagnosis Date Noted   Normocytic anemia 08/11/2021   Hypokalemia 08/09/2021   Displaced comminuted supracondylar fracture of right humerus without intercondylar fracture 08/08/2021   Open fracture of right humerus 08/08/2021   History of DVT (deep vein thrombosis) 06/06/2021   Rheumatoid arthritis of multiple sites without rheumatoid factor ([place]) 04/24/2021   History of pulmonary embolism 02/04/2020   History of blood clotting disorder 01/12/2020   Immunosuppression ([place]) 01/25/2019   Status post reverse total shoulder replacement, right 09/24/2018   Hyperglycemia 08/31/2018   S/P right colectomy 01/26/2018   Adenomatous polyp of ascending colon 11/24/2017   Generalized anxiety disorder 09/06/2017   Irritable bowel syndrome (IBS) 09/06/2017   Orthostasis 06/05/2016   Left shoulder pain 10/13/2015   Constipation 05/30/2015   Osteoporosis, postmenopausal 05/30/2015   Compression fracture of L2 lumbar vertebra with delayed healing 05/29/2015  Frequent falls 03/26/2015   Atherosclerosis of arteries 03/26/2015   Family history of colon cancer 09/28/2014   TMJ (temporomandibular joint syndrome) 06/12/2014   Varicose veins of both lower extremities without ulcer or inflammation 06/12/2014   B12 deficiency 09/20/2013   Fatigue 05/23/2013   Internal hemorrhoids without complication 45/84/8350   Insomnia 03/03/2013   Vertigo due to cerebrovascular disease 03/03/2013   Seronegative rheumatoid arthritis affecting lower leg ([place]) 03/03/2013    Rosalyn Gess, OTR/L,CLT 11/15/2021, 1:07 PM  [facility] [address], [place],  [address] Phone: [phone]   Fax:  [phone]  Name: AIYONNA LUCADO MRN: [id number] Date of Birth: 02-06-1932

## 2021-11-20 ENCOUNTER — Ambulatory Visit: Payer: Medicare Other | Admitting: Occupational Therapy

## 2021-11-20 ENCOUNTER — Encounter: Payer: Self-pay | Admitting: Occupational Therapy

## 2021-11-20 DIAGNOSIS — M25521 Pain in right elbow: Secondary | ICD-10-CM

## 2021-11-20 DIAGNOSIS — M6281 Muscle weakness (generalized): Secondary | ICD-10-CM

## 2021-11-20 DIAGNOSIS — L905 Scar conditions and fibrosis of skin: Secondary | ICD-10-CM

## 2021-11-20 NOTE — Therapy (Signed)
[facility] [address], [place], [address] Phone: [phone]   Fax:  [phone]  Occupational Therapy Treatment  Patient Details  Name: Teresa Lin MRN: [id number] Date of Birth: 02-Jun-1931 Referring Provider (OT): Dr Marcelino Scot   Encounter Date: 11/20/2021   OT End of Session - 11/20/21 0836     Visit Number 16    Number of Visits 21    Date for OT Re-Evaluation 01/01/22    OT Start Time 0818    OT Stop Time 0901    OT Time Calculation (min) 43 min    Activity Tolerance Patient tolerated treatment well    Behavior During Therapy [facility] for tasks assessed/performed             Past Medical History:  Diagnosis Date   Anemia    Anxiety    Arthritis    osteoarthritis. Bilateral knee replacement, hands, meniscal tear, cervical disc disease   Atherosclerosis    Atypical chest pain    Collagen vascular disease (HCC)    Colon polyp 12/15/2017   Colon polyps    Compression fracture of L2 (HCC)    Diverticulosis    DVT (deep venous thrombosis) (HCC)    Fracture of one rib, right side, subsequent encounter for fracture with routine healing 03/08/2017   GERD (gastroesophageal reflux disease)    History of seronegative inflammatory arthritis    HOH (hard of hearing)    no hearing aids   Hypertension    IBS (irritable bowel syndrome)    Internal hemorrhoids    Phlebitis and thrombophlebitis of deep veins of lower extremities ([place])    after surgery   Vision abnormalities     Past Surgical History:  Procedure Laterality Date   APPENDECTOMY     COLONOSCOPY N/A 01/24/2020   Procedure: COLONOSCOPY;  Surgeon: Lesly Rubenstein, MD;  Location: [facility] ENDOSCOPY;  Service: Endoscopy;  Laterality: N/A;   COLONOSCOPY WITH PROPOFOL N/A 10/24/2017   Procedure: COLONOSCOPY WITH PROPOFOL;  Surgeon: Manya Silvas, MD;  Location: [facility] ENDOSCOPY;  Service: Endoscopy;  Laterality: N/A;   COLONOSCOPY WITH PROPOFOL  N/A 11/21/2017   Procedure: COLONOSCOPY WITH PROPOFOL;  Surgeon: Manya Silvas, MD;  Location: [facility] ENDOSCOPY;  Service: Endoscopy;  Laterality: N/A;   DILATION AND CURETTAGE OF UTERUS     ESOPHAGOGASTRODUODENOSCOPY (EGD) WITH PROPOFOL N/A 02/25/2018   Procedure: ESOPHAGOGASTRODUODENOSCOPY (EGD) WITH PROPOFOL;  Surgeon: Manya Silvas, MD;  Location: [facility] ENDOSCOPY;  Service: Endoscopy;  Laterality: N/A;   EYE SURGERY     bilateral catarACT   JOINT REPLACEMENT Bilateral    knee replacement   LAPAROSCOPIC RIGHT COLECTOMY Right 12/15/2017   Procedure: LAPAROSCOPIC RIGHT COLECTOMY;  Surgeon: Robert Bellow, MD;  Location: [facility] ORS;  Service: General;  Laterality: Right;   ORIF HUMERUS FRACTURE Right 08/09/2021   Procedure: IRRIGATION AND DEBRIDEMENT; OPEN REDUCTION INTERNAL FIXATION (ORIF) DISTAL HUMERUS FRACTURE;  Surgeon: Altamese [place], MD;  Location: [facility];  Service: Orthopedics;  Laterality: Right;   REPLACEMENT TOTAL KNEE BILATERAL     TOTAL SHOULDER ARTHROPLASTY Right 09/24/2018   Procedure: TOTAL SHOULDER ARTHROPLASTY - REVERSE RIGHT;  Surgeon: Corky Mull, MD;  Location: [facility] ORS;  Service: Orthopedics;  Laterality: Right;    There were no vitals filed for this visit.   Subjective Assessment - 11/20/21 0834     Subjective  Not really pain just more stiffness especially in the morning.  I overdone the 2 pound weight  I think that the day my elbow was stiff and bothersome.  So still hard time with brushing teeth and fixing my here in the back.  But I can eat with my right hand I can pull up my pins, buttons, tying shoes to my cooking.  Cannot push the door open    Pertinent History Pt is an 88 y.o. female admitted 08/08/21 after fall sustaining open R supracondylar distal humerus fx. S/p R humeral ORIF, ulnar nerve neuroplasty 3/30 by Dr Marcelino Scot. PMH includes HTN, IBS, DVT/PE, arthritis, anxiety, HOH, multiple joint replacements. Pt was in hospital recieving OT and PT followed by [facility].  Pt refer to OT with increase pain and swelling in R hand and wrist after fall. Had to stop her arthritis medication while when she had to had surgery.    Patient Stated Goals I am right-handed so I really need my elbow, wrist and hand.  I want to get my motion and strength better in my right arm and hand, wrist so that I can do the things around the house that I need to do.    Currently in Pain? No/denies              Patient report coming in today continue to have more stiffness but decreased during the day.  Increased discomfort and pain with strengthening and the wrist and elbow the last 2 weeks.  Patient insurance is not approving her infusion drug anymore so awaiting approval of next drug. Patient was able to pull door open but not push door open.  Elbow extension limited more than flexion. Pushing up from a chair patient needed elevated with a pillow as well as not using 50-50 pushing through right arm Add to home program patient to do 2 times a day only 6 reps pushing up from a chair 50-50 with a pillow in the chair Needs her black CMC neoprene for comfort at thumb and wrist because of pain   OT focusing on elbow extension during session after paraffin with heating pad in combination for elbow extension stretch.   Able to get full extension in session.     Reviewed with patient again 2 sets of elbow extension in supine with red thera band  - wrapped around right hand and left hand stabilize at right shoulder -patient to use palm not to grip Thera-Band as well as keeping right shoulder down -patient doing elbow extension    -reviewed several times  -and videos were ordered in the past    Progressing very well in elbow extension and flexion Initiated last 2 wks  2 pound weight for elbow extension against gravity with shoulder at 90 degrees.  Was able to do 1 sets of 6 reporting it being very hard.   Patient to do 1 set of 2 pound weight for 5-6 reps and then continue with 1 pound  weight Done in clinic 2 sets of 12 pain-free. Needs reinforcement again not to overdo it but to count in causing pain.   Reviewed with patient interlocking hands for elbow flexion sliding behind head- patient to do several times during the day for wraps holding 30 seconds. Followed by interlocking hands and doing elbow flexion trying to touch left ear and the right ear 10-15 reps.   Done this date with patient on Biodex bilateral elbow extension with 10 pounds 2x12 reps.  Needed mod assist from OT for placed on hold and pushing into extension because of pain Reviewed with patient ended a handout  for tendon glides as well as opposition, wrist extension flexion and supination pronation Did add to home program 1 pound for supination pronation 2 sets of 12   Done NuStep this date and resistance of 3 for patient to use legs but also arm with focusing on right elbow extension during NuStep for 6 minutes   Patient to continue with light blue putty for light gripping as well as three-point pinch reinforced for patient to stop when feeling a pull.  Can use CMC neoprene brace with her band, weight and prehension strength exercises to decrease pain. Redone patient handout and reviewed again all home exercises per patient request.                             OT Treatments/Exercises (OP) - 11/20/21 0001       RUE Paraffin   Number Minutes Paraffin 8 Minutes    RUE Paraffin Location Hand   elbow   Comments Soft tissue motion EXTR                    OT Education - 11/20/21 0836     Education Details Progress and changes to home program    Person(s) Educated Patient    Methods Explanation;Demonstration;Tactile cues;Verbal cues;Handout    Comprehension Verbal cues required;Returned demonstration;Verbalized understanding              OT Short Term Goals - 10/30/21 1044       OT SHORT TERM GOAL #1   Title Pt to be independent in HEP to increase AROM in right  elbow, wrist and hand  with less pain    Baseline Elbow extension -30, flexion 130, supination 75 with all pain increased endrange.  Wrist extension decreased as well as flexion compared to September last year  NOW ext -22 to -5 and flexion 140-pain still with PROM end range and weight    Status Achieved               OT Long Term Goals - 11/20/21 0941       OT LONG TERM GOAL #1   Title Right elbow flexion increased for patient to be able to put her hearing aids in and fix her hair with right dominant hand with pain less than 3/10    Baseline Patient reports she can eat as well as put her hearing aids in now but still hard to fix her hair in the back as well as pushing the door open or raking.  Brushing teeth is also a little hard-pain increased somewhat the last 2 weeks because of changes in her infusion drug for arthritis    Time 6    Period Weeks    Status On-going    Target Date 01/01/22      OT LONG TERM GOAL #2   Title Right elbow extension increased with more than 15 degrees for patient to be able to reach in pocket and shelves without pain.    Baseline Elbow extension range of motion increased to close to normal limits in session she can reach up overhead and into pockets, to pull up pants.  Strength still lacking cannot push the door open or push up from a chair or rake outside.6    Status Achieved      OT LONG TERM GOAL #3   Title Right elbow flexion extension increased for patient to be able to push heavy door open, turn a  doorknob and carrying more than 5 pounds without increase symptoms    Baseline Patient is range of motion increased greatly working on last 10- 20 degrees but in session able to get nearly full motion.  Increased pain the last 2 weeks because of changes in her infusion drug for arthritis.  Strength improving but still hard time doing raking, pushing door open or pushing up from a chair.  As well as fixing hair in the back of the head were brushing teeth.   But able to initiate more strengthening.    Time 6    Period Weeks    Status On-going    Target Date 01/01/22                   Plan - 11/20/21 0837     Clinical Impression Statement Patient presented OT evaluation with a diagnosis of her right elbow supracondylar fracture with ORIF on 08/09/2021 after a fall.   Since evaluation patient showed  decrease edema and decreased pain in wrist and digits with increased motion.  She started back since surgery her arthritis medication that helped her rheumatoid arthritis symptoms and pain but then noticed the last 2 weeks patient had increased pain again at rest hand and elbow with strengthening.  Patient report that insurance does not want to cover infusion anymore and has to change to another drug.  Awaiting approval..  Digit and wrist active range of motion return back to prior to falll.  Patient continues to show increased motion as well as strength in right upper extremity with increased functional use..  Encourage patient to use her CMC neoprene splint for prehension as well as weight and band home exercises.  Had to reinforce several times today again to use CMC neoprene with her weights and bands home exercises.  Had to review with patient's home exercises again as well as providing a new written handout last week.  Reinforced red Thera-Band for elbow extension in supine as well as 1 and 2 pound weight for elbow extension flexion overhead in supine.  Patient to do the first set with 2 pound and then decreased to 1 pound for the second or third set.  But patient with increased functional use of right dominant hand mostly limited with fixing her hair as well as raking outside and pushing door open-  patient is limited in her strengthening by her arthritis pain at right wrist and thumb. Patient is right-hand dominant and present with increased scar tissu and pain with  with limited range of motion and decreased strength in right dominant arm limiting  her functional use of right hand and ADLs and IADLs.  Patient can benefit from OT services to increase use of right dominant hand.    OT Occupational Profile and History Problem Focused Assessment - Including review of records relating to presenting problem    Occupational performance deficits (Please refer to evaluation for details): ADL's;IADL's;Play;Leisure;Social Participation    Body Structure / Function / Physical Skills ADL;Strength;Decreased knowledge of use of DME;Decreased knowledge of precautions;Flexibility;Scar mobility;ROM;IADL;Edema;UE functional use;Pain;Dexterity    Rehab Potential Good    Clinical Decision Making Limited treatment options, no task modification necessary    Comorbidities Affecting Occupational Performance: None    Modification or Assistance to Complete Evaluation  Min-Moderate modification of tasks or assist with assess necessary to complete eval    OT Duration 8 weeks    OT Treatment/Interventions Self-care/ADL training;Splinting;Therapeutic exercise;Patient/family education;DME and/or AE instruction;Contrast Bath;Scar mobilization;Cryotherapy;Paraffin;Manual Therapy;Passive range of motion  Consulted and Agree with Plan of Care Patient             Patient will benefit from skilled therapeutic intervention in order to improve the following deficits and impairments:   Body Structure / Function / Physical Skills: ADL, Strength, Decreased knowledge of use of DME, Decreased knowledge of precautions, Flexibility, Scar mobility, ROM, IADL, Edema, UE functional use, Pain, Dexterity       Visit Diagnosis: Pain in right elbow - Plan: Ot plan of care cert/re-cert  Scar condition and fibrosis of skin - Plan: Ot plan of care cert/re-cert  Muscle weakness (generalized) - Plan: Ot plan of care cert/re-cert    Problem List Patient Active Problem List   Diagnosis Date Noted   Normocytic anemia 08/11/2021   Hypokalemia 08/09/2021   Displaced comminuted  supracondylar fracture of right humerus without intercondylar fracture 08/08/2021   Open fracture of right humerus 08/08/2021   History of DVT (deep vein thrombosis) 06/06/2021   Rheumatoid arthritis of multiple sites without rheumatoid factor ([place]) 04/24/2021   History of pulmonary embolism 02/04/2020   History of blood clotting disorder 01/12/2020   Immunosuppression ([place]) 01/25/2019   Status post reverse total shoulder replacement, right 09/24/2018   Hyperglycemia 08/31/2018   S/P right colectomy 01/26/2018   Adenomatous polyp of ascending colon 11/24/2017   Generalized anxiety disorder 09/06/2017   Irritable bowel syndrome (IBS) 09/06/2017   Orthostasis 06/05/2016   Left shoulder pain 10/13/2015   Constipation 05/30/2015   Osteoporosis, postmenopausal 05/30/2015   Compression fracture of L2 lumbar vertebra with delayed healing 05/29/2015   Frequent falls 03/26/2015   Atherosclerosis of arteries 03/26/2015   Family history of colon cancer 09/28/2014   TMJ (temporomandibular joint syndrome) 06/12/2014   Varicose veins of both lower extremities without ulcer or inflammation 06/12/2014   B12 deficiency 09/20/2013   Fatigue 05/23/2013   Internal hemorrhoids without complication 60/63/0160   Insomnia 03/03/2013   Vertigo due to cerebrovascular disease 03/03/2013   Seronegative rheumatoid arthritis affecting lower leg ([place]) 03/03/2013    Rosalyn Gess, OTR/L,CLT 11/20/2021, 9:45 AM  [facility] [address] [place], [address] Phone: [phone]   Fax:  [phone]  Name: Teresa Lin MRN: [id number] Date of Birth: [birth year]

## 2021-11-22 ENCOUNTER — Ambulatory Visit: Payer: Medicare Other | Admitting: Occupational Therapy

## 2021-11-22 DIAGNOSIS — L905 Scar conditions and fibrosis of skin: Secondary | ICD-10-CM

## 2021-11-22 DIAGNOSIS — M6281 Muscle weakness (generalized): Secondary | ICD-10-CM

## 2021-11-22 DIAGNOSIS — M25521 Pain in right elbow: Secondary | ICD-10-CM | POA: Diagnosis not present

## 2021-11-22 NOTE — Therapy (Signed)
[facility] [address] [place], [address] Phone: [phone]   Fax:  [phone]  Occupational Therapy Treatment  Patient Details  Name: Teresa Lin MRN: [id number] Date of Birth: June 28, 1931 Referring Provider (OT): Dr Marcelino Scot   Encounter Date: 11/22/2021   OT End of Session - 11/22/21 1333     Visit Number 17    Number of Visits 21    Date for OT Re-Evaluation 01/01/22    OT Start Time 6789    OT Stop Time 1400    OT Time Calculation (min) 43 min    Activity Tolerance Patient tolerated treatment well    Behavior During Therapy [facility] for tasks assessed/performed             Past Medical History:  Diagnosis Date   Anemia    Anxiety    Arthritis    osteoarthritis. Bilateral knee replacement, hands, meniscal tear, cervical disc disease   Atherosclerosis    Atypical chest pain    Collagen vascular disease (HCC)    Colon polyp 12/15/2017   Colon polyps    Compression fracture of L2 (HCC)    Diverticulosis    DVT (deep venous thrombosis) (HCC)    Fracture of one rib, right side, subsequent encounter for fracture with routine healing 03/08/2017   GERD (gastroesophageal reflux disease)    History of seronegative inflammatory arthritis    HOH (hard of hearing)    no hearing aids   Hypertension    IBS (irritable bowel syndrome)    Internal hemorrhoids    Phlebitis and thrombophlebitis of deep veins of lower extremities ([place])    after surgery   Vision abnormalities     Past Surgical History:  Procedure Laterality Date   APPENDECTOMY     COLONOSCOPY N/A 01/24/2020   Procedure: COLONOSCOPY;  Surgeon: Lesly Rubenstein, MD;  Location: [facility] ENDOSCOPY;  Service: Endoscopy;  Laterality: N/A;   COLONOSCOPY WITH PROPOFOL N/A 10/24/2017   Procedure: COLONOSCOPY WITH PROPOFOL;  Surgeon: Manya Silvas, MD;  Location: [facility] ENDOSCOPY;  Service: Endoscopy;  Laterality: N/A;   COLONOSCOPY WITH PROPOFOL  N/A 11/21/2017   Procedure: COLONOSCOPY WITH PROPOFOL;  Surgeon: Manya Silvas, MD;  Location: [facility] ENDOSCOPY;  Service: Endoscopy;  Laterality: N/A;   DILATION AND CURETTAGE OF UTERUS     ESOPHAGOGASTRODUODENOSCOPY (EGD) WITH PROPOFOL N/A 02/25/2018   Procedure: ESOPHAGOGASTRODUODENOSCOPY (EGD) WITH PROPOFOL;  Surgeon: Manya Silvas, MD;  Location: [facility] ENDOSCOPY;  Service: Endoscopy;  Laterality: N/A;   EYE SURGERY     bilateral catarACT   JOINT REPLACEMENT Bilateral    knee replacement   LAPAROSCOPIC RIGHT COLECTOMY Right 12/15/2017   Procedure: LAPAROSCOPIC RIGHT COLECTOMY;  Surgeon: Robert Bellow, MD;  Location: [facility] ORS;  Service: General;  Laterality: Right;   ORIF HUMERUS FRACTURE Right 08/09/2021   Procedure: IRRIGATION AND DEBRIDEMENT; OPEN REDUCTION INTERNAL FIXATION (ORIF) DISTAL HUMERUS FRACTURE;  Surgeon: Altamese Auxier, MD;  Location: [place];  Service: Orthopedics;  Laterality: Right;   REPLACEMENT TOTAL KNEE BILATERAL     TOTAL SHOULDER ARTHROPLASTY Right 09/24/2018   Procedure: TOTAL SHOULDER ARTHROPLASTY - REVERSE RIGHT;  Surgeon: Corky Mull, MD;  Location: [facility] ORS;  Service: Orthopedics;  Laterality: Right;    There were no vitals filed for this visit.   Subjective Assessment - 11/22/21 1332     Subjective  I asked them to not change my arthritis medication.  I had infusion earlier this week.  I amreally doing good with this medication.    Pertinent History Pt is an 88 y.o. female admitted 08/08/21 after fall sustaining open R supracondylar distal humerus fx. S/p R humeral ORIF, ulnar nerve neuroplasty 3/30 by Dr Marcelino Scot. PMH includes HTN, IBS, DVT/PE, arthritis, anxiety, HOH, multiple joint replacements. Pt was in hospital recieving OT and PT followed by [facility]. Pt refer to OT with increase pain and swelling in R hand and wrist after fall. Had to stop her arthritis medication while when she had to had surgery.    Patient Stated Goals I am right-handed so I really need  my elbow, wrist and hand.  I want to get my motion and strength better in my right arm and hand, wrist so that I can do the things around the house that I need to do.    Currently in Pain? No/denies                          OT Treatments/Exercises (OP) - 11/22/21 0001       RUE Paraffin   Number Minutes Paraffin 8 Minutes    RUE Paraffin Location Hand    Type --   elbow   Comments Prior to soft tissue extension stretch for elbow with heating pad and 2 pound weight              Patient has Orencia infusion yesterday.  Patient this date with less pain and stiffness coming in at elbow wrist and thumb.  Needs still her black CMC neoprene for comfort at thumb and wrist because of pain Elbow extension strength manually assess increase strength and range.  4/5 strength. Continues to be limited in endrange extension as well as end-stage flexion and supination.     OT focusing on elbow extension and supination during session after paraffin with heating pad in combination for elbow extension stretch.   Able to get full extension in session.     Reviewed with patient again 2 sets of elbow extension in supine with red thera band  - wrapped around right hand and left hand stabilize at right shoulder -patient to use palm not to grip Thera-Band as well as keeping right shoulder down -patient doing elbow extension    -reviewed several times  -and videos were ordered in the past    Progressing very well in elbow extension and flexion Initiated last 2 wks  2 pound weight for elbow extension against gravity with shoulder at 90 degrees.  Patient able to do 2 sets of 12 with 2 pound weight.  When distracted and talking.   Also did with upper arm to side in supine 2 pounds for extension to hip flexion to shoulder 2 sets of 12.  Needs reinforcement again not to overdo it but to count in causing pain.     Reviewed with patient interlocking hands for elbow flexion sliding behind head-  patient to do several times during the day for wraps holding 30 seconds. Followed by interlocking hands and doing elbow flexion trying to touch left ear and the right ear 10-15 reps.   Done this date with patient on Biodex bilateral elbow extension with 10 pounds 2x12 reps.  Needed mod assist from OT for placed on hold and pushing into extension because of pain Supination done this date with [facility] neoprene splint 2 sets of 12 with a 2 pound weight.    Could not do NuStep Red Thera-Band was done at the door  with upper arm to side for elbow extension 2 sets of 12. As well as 2 sets of 12 for scapular retraction red Thera-Band   Patient to continue with light blue putty for light gripping as well as three-point pinch reinforced for patient to stop when feeling a pull.  Can use CMC neoprene brace with her band, weight and prehension strength exercises to decrease pain. Redone patient handout and reviewed again all home exercises per patient request.                OT Education - 11/22/21 1333     Education Details Progress and changes to home program    Person(s) Educated Patient    Methods Explanation;Demonstration;Tactile cues;Verbal cues;Handout    Comprehension Verbal cues required;Returned demonstration;Verbalized understanding              OT Short Term Goals - 10/30/21 1044       OT SHORT TERM GOAL #1   Title Pt to be independent in HEP to increase AROM in right elbow, wrist and hand  with less pain    Baseline Elbow extension -30, flexion 130, supination 75 with all pain increased endrange.  Wrist extension decreased as well as flexion compared to September last year  NOW ext -22 to -5 and flexion 140-pain still with PROM end range and weight    Status Achieved               OT Long Term Goals - 11/20/21 0941       OT LONG TERM GOAL #1   Title Right elbow flexion increased for patient to be able to put her hearing aids in and fix her hair with right dominant  hand with pain less than 3/10    Baseline Patient reports she can eat as well as put her hearing aids in now but still hard to fix her hair in the back as well as pushing the door open or raking.  Brushing teeth is also a little hard-pain increased somewhat the last 2 weeks because of changes in her infusion drug for arthritis    Time 6    Period Weeks    Status On-going    Target Date 01/01/22      OT LONG TERM GOAL #2   Title Right elbow extension increased with more than 15 degrees for patient to be able to reach in pocket and shelves without pain.    Baseline Elbow extension range of motion increased to close to normal limits in session she can reach up overhead and into pockets, to pull up pants.  Strength still lacking cannot push the door open or push up from a chair or rake outside.6    Status Achieved      OT LONG TERM GOAL #3   Title Right elbow flexion extension increased for patient to be able to push heavy door open, turn a doorknob and carrying more than 5 pounds without increase symptoms    Baseline Patient is range of motion increased greatly working on last 10- 20 degrees but in session able to get nearly full motion.  Increased pain the last 2 weeks because of changes in her infusion drug for arthritis.  Strength improving but still hard time doing raking, pushing door open or pushing up from a chair.  As well as fixing hair in the back of the head were brushing teeth.  But able to initiate more strengthening.    Time 6    Period Weeks  Status On-going    Target Date 01/01/22                   Plan - 11/22/21 1334     Clinical Impression Statement Patient presented OT evaluation with a diagnosis of her right elbow supracondylar fracture with ORIF on 08/09/2021 after a fall.   Since evaluation patient showed  decrease edema and decreased pain in wrist and digits with increased motion.  She started back since surgery her arthritis medication that helped her  rheumatoid arthritis symptoms and pain but then noticed the last 2 weeks patient had increased pain again at rest hand and elbow with strengthening.  Patient report that insurance does not want to cover infusion anymore and has to change to another drug.  Awaiting approval. BUT this date pt with decrease pain after having ?infusion earlier this week per pt.  Digit and wrist active range of motion return back to prior to falll.  Patient continues to show increased motion as well as strength in right upper extremity with increased functional use..  Encourage patient to use her CMC neoprene splint for prehension as well as weight and band home exercises.  Had to reinforce several times today again to use CMC neoprene with her weights and bands home exercises.  Had to review with patient's home exercises again as well as providing a new written handout last week.  Reinforced red Thera-Band for elbow extension in supine as well as  2 pound weight for elbow extension/ flexion overhead in supine. Pt with increased functional use of right dominant hand mostly limited with fixing her hair as well as raking outside and pushing door open. Patient is limited in her strengthening by her arthritis pain at right wrist and thumb. Patient is right-hand dominant and present with increased scar tissu and pain with  with limited range of motion and decreased strength in right dominant arm limiting her functional use of right hand and ADLs and IADLs.  Patient can benefit from OT services to increase use of right dominant hand.    OT Occupational Profile and History Problem Focused Assessment - Including review of records relating to presenting problem    Occupational performance deficits (Please refer to evaluation for details): ADL's;IADL's;Play;Leisure;Social Participation    Body Structure / Function / Physical Skills ADL;Strength;Decreased knowledge of use of DME;Decreased knowledge of precautions;Flexibility;Scar  mobility;ROM;IADL;Edema;UE functional use;Pain;Dexterity    Rehab Potential Good    Clinical Decision Making Limited treatment options, no task modification necessary    Comorbidities Affecting Occupational Performance: None    Modification or Assistance to Complete Evaluation  Min-Moderate modification of tasks or assist with assess necessary to complete eval    OT Frequency 2x / week    OT Duration 8 weeks    OT Treatment/Interventions Self-care/ADL training;Splinting;Therapeutic exercise;Patient/family education;DME and/or AE instruction;Contrast Bath;Scar mobilization;Cryotherapy;Paraffin;Manual Therapy;Passive range of motion    Consulted and Agree with Plan of Care Patient             Patient will benefit from skilled therapeutic intervention in order to improve the following deficits and impairments:   Body Structure / Function / Physical Skills: ADL, Strength, Decreased knowledge of use of DME, Decreased knowledge of precautions, Flexibility, Scar mobility, ROM, IADL, Edema, UE functional use, Pain, Dexterity       Visit Diagnosis: Pain in right elbow  Scar condition and fibrosis of skin  Muscle weakness (generalized)    Problem List Patient Active Problem List   Diagnosis Date Noted  Normocytic anemia 08/11/2021   Hypokalemia 08/09/2021   Displaced comminuted supracondylar fracture of right humerus without intercondylar fracture 08/08/2021   Open fracture of right humerus 08/08/2021   History of DVT (deep vein thrombosis) 06/06/2021   Rheumatoid arthritis of multiple sites without rheumatoid factor ([place]) 04/24/2021   History of pulmonary embolism 02/04/2020   History of blood clotting disorder 01/12/2020   Immunosuppression ([place]) 01/25/2019   Status post reverse total shoulder replacement, right 09/24/2018   Hyperglycemia 08/31/2018   S/P right colectomy 01/26/2018   Adenomatous polyp of ascending colon 11/24/2017   Generalized anxiety disorder 09/06/2017    Irritable bowel syndrome (IBS) 09/06/2017   Orthostasis 06/05/2016   Left shoulder pain 10/13/2015   Constipation 05/30/2015   Osteoporosis, postmenopausal 05/30/2015   Compression fracture of L2 lumbar vertebra with delayed healing 05/29/2015   Frequent falls 03/26/2015   Atherosclerosis of arteries 03/26/2015   Family history of colon cancer 09/28/2014   TMJ (temporomandibular joint syndrome) 06/12/2014   Varicose veins of both lower extremities without ulcer or inflammation 06/12/2014   B12 deficiency 09/20/2013   Fatigue 05/23/2013   Internal hemorrhoids without complication 61/95/0932   Insomnia 03/03/2013   Vertigo due to cerebrovascular disease 03/03/2013   Seronegative rheumatoid arthritis affecting lower leg ([place]) 03/03/2013    Rosalyn Gess, OTR/L,CLT 11/22/2021, 2:10 PM  [facility] [address] [place], [address] Phone: [phone]   Fax:  [phone]  Name: Teresa Lin MRN: [id number] Date of Birth: Mar 03, 1932

## 2021-12-03 ENCOUNTER — Ambulatory Visit: Payer: Medicare Other | Admitting: Occupational Therapy

## 2021-12-03 ENCOUNTER — Encounter: Payer: Medicare Other | Admitting: Occupational Therapy

## 2021-12-03 DIAGNOSIS — M25521 Pain in right elbow: Secondary | ICD-10-CM | POA: Diagnosis not present

## 2021-12-03 DIAGNOSIS — M25642 Stiffness of left hand, not elsewhere classified: Secondary | ICD-10-CM

## 2021-12-03 DIAGNOSIS — M79641 Pain in right hand: Secondary | ICD-10-CM

## 2021-12-03 DIAGNOSIS — L905 Scar conditions and fibrosis of skin: Secondary | ICD-10-CM

## 2021-12-03 DIAGNOSIS — R600 Localized edema: Secondary | ICD-10-CM

## 2021-12-03 DIAGNOSIS — M25531 Pain in right wrist: Secondary | ICD-10-CM

## 2021-12-03 DIAGNOSIS — M25641 Stiffness of right hand, not elsewhere classified: Secondary | ICD-10-CM

## 2021-12-03 DIAGNOSIS — M6281 Muscle weakness (generalized): Secondary | ICD-10-CM

## 2021-12-03 DIAGNOSIS — M25631 Stiffness of right wrist, not elsewhere classified: Secondary | ICD-10-CM

## 2021-12-07 ENCOUNTER — Encounter: Payer: Self-pay | Admitting: Occupational Therapy

## 2021-12-07 NOTE — Therapy (Signed)
[facility] [address] [place], [address] Phone: [phone]   Fax:  [phone]  Occupational Therapy Treatment  Patient Details  Name: Teresa Lin MRN: [id number] Date of Birth: 06-09-1931 Referring Provider (OT): Dr Marcelino Scot   Encounter Date: 12/03/2021 Rationale for Evaluation and Treatment Rehabilitation    OT End of Session - 12/07/21 2014     Visit Number 18    Number of Visits 21    Date for OT Re-Evaluation 01/01/22    OT Start Time 1205    OT Stop Time 1255    OT Time Calculation (min) 50 min    Activity Tolerance Patient tolerated treatment well    Behavior During Therapy [facility] for tasks assessed/performed             Past Medical History:  Diagnosis Date   Anemia    Anxiety    Arthritis    osteoarthritis. Bilateral knee replacement, hands, meniscal tear, cervical disc disease   Atherosclerosis    Atypical chest pain    Collagen vascular disease (HCC)    Colon polyp 12/15/2017   Colon polyps    Compression fracture of L2 (HCC)    Diverticulosis    DVT (deep venous thrombosis) (HCC)    Fracture of one rib, right side, subsequent encounter for fracture with routine healing 03/08/2017   GERD (gastroesophageal reflux disease)    History of seronegative inflammatory arthritis    HOH (hard of hearing)    no hearing aids   Hypertension    IBS (irritable bowel syndrome)    Internal hemorrhoids    Phlebitis and thrombophlebitis of deep veins of lower extremities ([place])    after surgery   Vision abnormalities     Past Surgical History:  Procedure Laterality Date   APPENDECTOMY     COLONOSCOPY N/A 01/24/2020   Procedure: COLONOSCOPY;  Surgeon: Lesly Rubenstein, MD;  Location: [facility] ENDOSCOPY;  Service: Endoscopy;  Laterality: N/A;   COLONOSCOPY WITH PROPOFOL N/A 10/24/2017   Procedure: COLONOSCOPY WITH PROPOFOL;  Surgeon: Manya Silvas, MD;  Location: [facility] ENDOSCOPY;  Service:  Endoscopy;  Laterality: N/A;   COLONOSCOPY WITH PROPOFOL N/A 11/21/2017   Procedure: COLONOSCOPY WITH PROPOFOL;  Surgeon: Manya Silvas, MD;  Location: [facility] ENDOSCOPY;  Service: Endoscopy;  Laterality: N/A;   DILATION AND CURETTAGE OF UTERUS     ESOPHAGOGASTRODUODENOSCOPY (EGD) WITH PROPOFOL N/A 02/25/2018   Procedure: ESOPHAGOGASTRODUODENOSCOPY (EGD) WITH PROPOFOL;  Surgeon: Manya Silvas, MD;  Location: [facility] ENDOSCOPY;  Service: Endoscopy;  Laterality: N/A;   EYE SURGERY     bilateral catarACT   JOINT REPLACEMENT Bilateral    knee replacement   LAPAROSCOPIC RIGHT COLECTOMY Right 12/15/2017   Procedure: LAPAROSCOPIC RIGHT COLECTOMY;  Surgeon: Robert Bellow, MD;  Location: [facility] ORS;  Service: General;  Laterality: Right;   ORIF HUMERUS FRACTURE Right 08/09/2021   Procedure: IRRIGATION AND DEBRIDEMENT; OPEN REDUCTION INTERNAL FIXATION (ORIF) DISTAL HUMERUS FRACTURE;  Surgeon: Altamese Steptoe, MD;  Location: [place];  Service: Orthopedics;  Laterality: Right;   REPLACEMENT TOTAL KNEE BILATERAL     TOTAL SHOULDER ARTHROPLASTY Right 09/24/2018   Procedure: TOTAL SHOULDER ARTHROPLASTY - REVERSE RIGHT;  Surgeon: Corky Mull, MD;  Location: [facility] ORS;  Service: Orthopedics;  Laterality: Right;    There were no vitals filed for this visit.   Subjective Assessment - 12/07/21 2012     Subjective  Pt requests to go over her home exercises this date,  wants to make sure she is doing them correctly.    Pertinent History Pt is an 88 y.o. female admitted 08/08/21 after fall sustaining open R supracondylar distal humerus fx. S/p R humeral ORIF, ulnar nerve neuroplasty 3/30 by Dr Marcelino Scot. PMH includes HTN, IBS, DVT/PE, arthritis, anxiety, HOH, multiple joint replacements. Pt was in hospital recieving OT and PT followed by [facility]. Pt refer to OT with increase pain and swelling in R hand and wrist after fall. Had to stop her arthritis medication while when she had to had surgery.    Patient Stated Goals I am  right-handed so I really need my elbow, wrist and hand.  I want to get my motion and strength better in my right arm and hand, wrist so that I can do the things around the house that I need to do.    Currently in Pain? No/denies    Pain Score 0-No pain            Paraffin to right elbow and hand for 8 mins, added additional heat to increase tissue mobility and ROM.    Pt requesting to go over her home exercises this date and asking if she needs to wear her black brace when performing select exercises.    Therapeutic Exercises:   Following paraffin, pt seen for therapeutic exercises as follows: Supine elbow extension with red theraband, cues for band placement, left hand stabilizing at right shoulder and pushing down for elbow extension.  2# weight for elbow extension for 2 sets 12 reps with shoulder flexed to 90 degrees.  Upper arm to side for extension to hip flexion to shoulder for 2 sets of 12 with cues for proper form and technique. Elbow flexion with interlocking hands to place hands behind head with 30 sec hold.  Reaching to attempt touching each ear, 10 reps.   Prolonged stretch for elbow extension followed by attempts at place and hold with elbow extended.   Nustep for 5 mins with no resistance, reciprocal arm movements with guiding at times from therapist.  Supination with splint in place 2 sets of 12 reps.    Blue putty with therapist demo for HEP for gross grasping, 3 point pinch, cues for monitoring motions and pain.   Opposition of thumb to each digit.   Review of all exercises on written/printed sheets with therapist demo, cues and return demonstration by patient.                     OT Education - 12/07/21 2013     Education Details Progress and changes to home program    Person(s) Educated Patient    Methods Explanation;Demonstration;Tactile cues;Verbal cues;Handout    Comprehension Verbal cues required;Returned demonstration;Verbalized understanding               OT Short Term Goals - 10/30/21 1044       OT SHORT TERM GOAL #1   Title Pt to be independent in HEP to increase AROM in right elbow, wrist and hand  with less pain    Baseline Elbow extension -30, flexion 130, supination 75 with all pain increased endrange.  Wrist extension decreased as well as flexion compared to September last year  NOW ext -22 to -5 and flexion 140-pain still with PROM end range and weight    Status Achieved               OT Long Term Goals - 11/20/21 9892  OT LONG TERM GOAL #1   Title Right elbow flexion increased for patient to be able to put her hearing aids in and fix her hair with right dominant hand with pain less than 3/10    Baseline Patient reports she can eat as well as put her hearing aids in now but still hard to fix her hair in the back as well as pushing the door open or raking.  Brushing teeth is also a little hard-pain increased somewhat the last 2 weeks because of changes in her infusion drug for arthritis    Time 6    Period Weeks    Status On-going    Target Date 01/01/22      OT LONG TERM GOAL #2   Title Right elbow extension increased with more than 15 degrees for patient to be able to reach in pocket and shelves without pain.    Baseline Elbow extension range of motion increased to close to normal limits in session she can reach up overhead and into pockets, to pull up pants.  Strength still lacking cannot push the door open or push up from a chair or rake outside.6    Status Achieved      OT LONG TERM GOAL #3   Title Right elbow flexion extension increased for patient to be able to push heavy door open, turn a doorknob and carrying more than 5 pounds without increase symptoms    Baseline Patient is range of motion increased greatly working on last 10- 20 degrees but in session able to get nearly full motion.  Increased pain the last 2 weeks because of changes in her infusion drug for arthritis.  Strength improving  but still hard time doing raking, pushing door open or pushing up from a chair.  As well as fixing hair in the back of the head were brushing teeth.  But able to initiate more strengthening.    Time 6    Period Weeks    Status On-going    Target Date 01/01/22                   Plan - 12/07/21 2014     Clinical Impression Statement Patient presented OT evaluation with a diagnosis of her right elbow supracondylar fracture with ORIF on 08/09/2021 after a fall.   Since evaluation patient showed  decrease edema and decreased pain in wrist and digits with increased motion.  She started back since surgery her arthritis medication that helped her rheumatoid arthritis symptoms and pain but then noticed the last 2 weeks patient had increased pain again at rest hand and elbow with strengthening.  Patient report that insurance does not want to cover infusion anymore and has to change to another drug.  Awaiting approval.   Patient continues to show increased motion as well as strength in right upper extremity with increased functional use..  Continue to encourage patient to use her [facility] neoprene splint for prehension as well as weight and band home exercises. Review with patient's home exercises this date per pt request.  Reinforced red Thera-Band for elbow extension in supine as well as  2 pound weight for elbow extension/ flexion overhead in supine. Pt with increased functional use of right dominant hand mostly limited with fixing her hair as well as raking outside and pushing door open. Patient is limited in her strengthening by her arthritis pain at right wrist and thumb. Patient is right-hand dominant and present with increased scar tissu and pain with  with limited range of motion and decreased strength in right dominant arm limiting her functional use of right hand and ADLs and IADLs.  Patient can benefit from OT services to increase use of right dominant hand.    OT Occupational Profile and History  Problem Focused Assessment - Including review of records relating to presenting problem    Occupational performance deficits (Please refer to evaluation for details): ADL's;IADL's;Play;Leisure;Social Participation    Body Structure / Function / Physical Skills ADL;Strength;Decreased knowledge of use of DME;Decreased knowledge of precautions;Flexibility;Scar mobility;ROM;IADL;Edema;UE functional use;Pain;Dexterity    Rehab Potential Good    Clinical Decision Making Limited treatment options, no task modification necessary    Comorbidities Affecting Occupational Performance: None    Modification or Assistance to Complete Evaluation  Min-Moderate modification of tasks or assist with assess necessary to complete eval    OT Frequency 2x / week    OT Duration 8 weeks    OT Treatment/Interventions Self-care/ADL training;Splinting;Therapeutic exercise;Patient/family education;DME and/or AE instruction;Contrast Bath;Scar mobilization;Cryotherapy;Paraffin;Manual Therapy;Passive range of motion    Consulted and Agree with Plan of Care Patient             Patient will benefit from skilled therapeutic intervention in order to improve the following deficits and impairments:   Body Structure / Function / Physical Skills: ADL, Strength, Decreased knowledge of use of DME, Decreased knowledge of precautions, Flexibility, Scar mobility, ROM, IADL, Edema, UE functional use, Pain, Dexterity       Visit Diagnosis: Pain in right elbow  Scar condition and fibrosis of skin  Muscle weakness (generalized)  Stiffness of right wrist, not elsewhere classified  Pain in right wrist  Localized edema  Stiffness of right hand, not elsewhere classified  Stiffness of left hand, not elsewhere classified  Pain in right hand  Pain in both hands    Problem List Patient Active Problem List   Diagnosis Date Noted   Normocytic anemia 08/11/2021   Hypokalemia 08/09/2021   Displaced comminuted supracondylar  fracture of right humerus without intercondylar fracture 08/08/2021   Open fracture of right humerus 08/08/2021   History of DVT (deep vein thrombosis) 06/06/2021   Rheumatoid arthritis of multiple sites without rheumatoid factor ([place]) 04/24/2021   History of pulmonary embolism 02/04/2020   History of blood clotting disorder 01/12/2020   Immunosuppression ([place]) 01/25/2019   Status post reverse total shoulder replacement, right 09/24/2018   Hyperglycemia 08/31/2018   S/P right colectomy 01/26/2018   Adenomatous polyp of ascending colon 11/24/2017   Generalized anxiety disorder 09/06/2017   Irritable bowel syndrome (IBS) 09/06/2017   Orthostasis 06/05/2016   Left shoulder pain 10/13/2015   Constipation 05/30/2015   Osteoporosis, postmenopausal 05/30/2015   Compression fracture of L2 lumbar vertebra with delayed healing 05/29/2015   Frequent falls 03/26/2015   Atherosclerosis of arteries 03/26/2015   Family history of colon cancer 09/28/2014   TMJ (temporomandibular joint syndrome) 06/12/2014   Varicose veins of both lower extremities without ulcer or inflammation 06/12/2014   B12 deficiency 09/20/2013   Fatigue 05/23/2013   Internal hemorrhoids without complication 09/60/4540   Insomnia 03/03/2013   Vertigo due to cerebrovascular disease 03/03/2013   Seronegative rheumatoid arthritis affecting lower leg (HCC) 03/03/2013   Teresa Lin T Pranish Akhavan, OTR/L, CLT  Khloey Chern, OT 12/07/2021, 8:31 PM  [facility] [address] [place], [address] Phone: [phone]   Fax:  [phone]  Name: EARSIE HUMM MRN: [id number] Date of Birth: 1931-12-23

## 2021-12-11 ENCOUNTER — Encounter: Payer: Self-pay | Admitting: Occupational Therapy

## 2021-12-11 ENCOUNTER — Ambulatory Visit: Payer: Medicare Other | Attending: Orthopedic Surgery | Admitting: Occupational Therapy

## 2021-12-11 DIAGNOSIS — M25521 Pain in right elbow: Secondary | ICD-10-CM

## 2021-12-11 DIAGNOSIS — M79641 Pain in right hand: Secondary | ICD-10-CM | POA: Diagnosis present

## 2021-12-11 DIAGNOSIS — M79642 Pain in left hand: Secondary | ICD-10-CM | POA: Diagnosis present

## 2021-12-11 DIAGNOSIS — R600 Localized edema: Secondary | ICD-10-CM | POA: Diagnosis present

## 2021-12-11 DIAGNOSIS — M25631 Stiffness of right wrist, not elsewhere classified: Secondary | ICD-10-CM | POA: Diagnosis present

## 2021-12-11 DIAGNOSIS — M25641 Stiffness of right hand, not elsewhere classified: Secondary | ICD-10-CM

## 2021-12-11 DIAGNOSIS — M25531 Pain in right wrist: Secondary | ICD-10-CM | POA: Diagnosis present

## 2021-12-11 DIAGNOSIS — M6281 Muscle weakness (generalized): Secondary | ICD-10-CM

## 2021-12-11 DIAGNOSIS — M25642 Stiffness of left hand, not elsewhere classified: Secondary | ICD-10-CM | POA: Diagnosis present

## 2021-12-11 DIAGNOSIS — L905 Scar conditions and fibrosis of skin: Secondary | ICD-10-CM

## 2021-12-12 NOTE — Therapy (Unsigned)
[facility] [address] [place], [address] Phone: [phone]   Fax:  [phone]  Occupational Therapy Treatment  Patient Details  Name: Teresa Lin MRN: [id number] Date of Birth: 03/02/1932 Referring Provider (OT): Dr Marcelino Scot   Encounter Date: 12/11/2021   OT End of Session - 12/12/21 1922     Visit Number 19    Number of Visits 21    Date for OT Re-Evaluation 01/01/22    OT Start Time 0900    OT Stop Time 1001    OT Time Calculation (min) 61 min    Activity Tolerance Patient tolerated treatment well    Behavior During Therapy [place] for tasks assessed/performed             Past Medical History:  Diagnosis Date   Anemia    Anxiety    Arthritis    osteoarthritis. Bilateral knee replacement, hands, meniscal tear, cervical disc disease   Atherosclerosis    Atypical chest pain    Collagen vascular disease (HCC)    Colon polyp 12/15/2017   Colon polyps    Compression fracture of L2 (HCC)    Diverticulosis    DVT (deep venous thrombosis) (HCC)    Fracture of one rib, right side, subsequent encounter for fracture with routine healing 03/08/2017   GERD (gastroesophageal reflux disease)    History of seronegative inflammatory arthritis    HOH (hard of hearing)    no hearing aids   Hypertension    IBS (irritable bowel syndrome)    Internal hemorrhoids    Phlebitis and thrombophlebitis of deep veins of lower extremities ([place])    after surgery   Vision abnormalities     Past Surgical History:  Procedure Laterality Date   APPENDECTOMY     COLONOSCOPY N/A 01/24/2020   Procedure: COLONOSCOPY;  Surgeon: Lesly Rubenstein, MD;  Location: [facility] ENDOSCOPY;  Service: Endoscopy;  Laterality: N/A;   COLONOSCOPY WITH PROPOFOL N/A 10/24/2017   Procedure: COLONOSCOPY WITH PROPOFOL;  Surgeon: Manya Silvas, MD;  Location: [facility] ENDOSCOPY;  Service: Endoscopy;  Laterality: N/A;   COLONOSCOPY WITH PROPOFOL  N/A 11/21/2017   Procedure: COLONOSCOPY WITH PROPOFOL;  Surgeon: Manya Silvas, MD;  Location: [facility] ENDOSCOPY;  Service: Endoscopy;  Laterality: N/A;   DILATION AND CURETTAGE OF UTERUS     ESOPHAGOGASTRODUODENOSCOPY (EGD) WITH PROPOFOL N/A 02/25/2018   Procedure: ESOPHAGOGASTRODUODENOSCOPY (EGD) WITH PROPOFOL;  Surgeon: Manya Silvas, MD;  Location: [facility] ENDOSCOPY;  Service: Endoscopy;  Laterality: N/A;   EYE SURGERY     bilateral catarACT   JOINT REPLACEMENT Bilateral    knee replacement   LAPAROSCOPIC RIGHT COLECTOMY Right 12/15/2017   Procedure: LAPAROSCOPIC RIGHT COLECTOMY;  Surgeon: Robert Bellow, MD;  Location: [facility] ORS;  Service: General;  Laterality: Right;   ORIF HUMERUS FRACTURE Right 08/09/2021   Procedure: IRRIGATION AND DEBRIDEMENT; OPEN REDUCTION INTERNAL FIXATION (ORIF) DISTAL HUMERUS FRACTURE;  Surgeon: Altamese Buffalo Soapstone, MD;  Location: [facility];  Service: Orthopedics;  Laterality: Right;   REPLACEMENT TOTAL KNEE BILATERAL     TOTAL SHOULDER ARTHROPLASTY Right 09/24/2018   Procedure: TOTAL SHOULDER ARTHROPLASTY - REVERSE RIGHT;  Surgeon: Corky Mull, MD;  Location: [facility] ORS;  Service: Orthopedics;  Laterality: Right;    There were no vitals filed for this visit.   Subjective Assessment - 12/12/21 1920     Subjective  Pt reports she is not sure why but she is struggling with exercises but is having difficulty  being consistent.  After discussion, it appears pt is unclear regarding details of her home program which she feels makes the task overwhelming and results in her not performing consistently.    Pertinent History Pt is an 88 y.o. female admitted 08/08/21 after fall sustaining open R supracondylar distal humerus fx. S/p R humeral ORIF, ulnar nerve neuroplasty 3/30 by Dr Marcelino Scot. PMH includes HTN, IBS, DVT/PE, arthritis, anxiety, HOH, multiple joint replacements. Pt was in hospital recieving OT and PT followed by [facility]. Pt refer to OT with increase pain and swelling in R hand  and wrist after fall. Had to stop her arthritis medication while when she had to had surgery.    Patient Stated Goals I am right-handed so I really need my elbow, wrist and hand.  I want to get my motion and strength better in my right arm and hand, wrist so that I can do the things around the house that I need to do.    Currently in Pain? No/denies    Pain Score 0-No pain             Paraffin to right elbow and hand for 8 mins, added additional heat to increase tissue mobility and ROM.      Therapeutic Exercises:   Following paraffin, pt seen for therapeutic exercises, therapist revised pt's home exercise sheet into a spreadsheet secondary to pt reports of being overwhelmed and confused regarding exercises.  Therapist created spreadsheet with each exercise, with written description along with a check off for am and pm performances to help keep pt organized and accountable.  Therapist took photo graphs of patient performing select exercises and will print off color photos for patient to have next session.  Will also revise spreadsheet for columns to cover one week span of time with daily check offs.   With new spreadsheet, therapist and pt demonstrated each exercise with modifications for verbiage as needed.  Pt to use exercise log for the next few days and we will reassess to see if this helps to decrease confusion for patient and to improve her consistency with performance of exercises on a daily basis.   Supine elbow extension with red theraband, cues for band placement, left hand stabilizing at right shoulder and pushing down for elbow extension.  2# weight for elbow extension for 2 sets 10 reps with shoulder flexed to 90 degrees.  Upper arm to side for extension to hip flexion to shoulder for 2 sets of 10 with cues for proper form and technique. Elbow flexion with interlocking hands to place hands behind head with 30 sec hold.  Reaching to attempt touching each ear, 10 reps.   Prolonged  stretch for elbow extension followed by attempts at place and hold with elbow extended.   Supination with splint in place 2 sets of 10 reps.     Blue putty with therapist demo for HEP for gross grasping, 3 point pinch, cues for monitoring motions and pain.   Opposition of thumb to each digit.     Progress Update next session                  OT Education - 12/12/21 1921     Education Details Reorganization of home exercise program to promote clarity and consistency.    Person(s) Educated Patient    Methods Explanation;Demonstration;Tactile cues;Verbal cues;Handout    Comprehension Verbal cues required;Returned demonstration;Verbalized understanding              OT Short Term  Goals - 10/30/21 1044       OT SHORT TERM GOAL #1   Title Pt to be independent in HEP to increase AROM in right elbow, wrist and hand  with less pain    Baseline Elbow extension -30, flexion 130, supination 75 with all pain increased endrange.  Wrist extension decreased as well as flexion compared to September last year  NOW ext -22 to -5 and flexion 140-pain still with PROM end range and weight    Status Achieved               OT Long Term Goals - 11/20/21 0941       OT LONG TERM GOAL #1   Title Right elbow flexion increased for patient to be able to put her hearing aids in and fix her hair with right dominant hand with pain less than 3/10    Baseline Patient reports she can eat as well as put her hearing aids in now but still hard to fix her hair in the back as well as pushing the door open or raking.  Brushing teeth is also a little hard-pain increased somewhat the last 2 weeks because of changes in her infusion drug for arthritis    Time 6    Period Weeks    Status On-going    Target Date 01/01/22      OT LONG TERM GOAL #2   Title Right elbow extension increased with more than 15 degrees for patient to be able to reach in pocket and shelves without pain.    Baseline Elbow  extension range of motion increased to close to normal limits in session she can reach up overhead and into pockets, to pull up pants.  Strength still lacking cannot push the door open or push up from a chair or rake outside.6    Status Achieved      OT LONG TERM GOAL #3   Title Right elbow flexion extension increased for patient to be able to push heavy door open, turn a doorknob and carrying more than 5 pounds without increase symptoms    Baseline Patient is range of motion increased greatly working on last 10- 20 degrees but in session able to get nearly full motion.  Increased pain the last 2 weeks because of changes in her infusion drug for arthritis.  Strength improving but still hard time doing raking, pushing door open or pushing up from a chair.  As well as fixing hair in the back of the head were brushing teeth.  But able to initiate more strengthening.    Time 6    Period Weeks    Status On-going    Target Date 01/01/22                   Plan - 12/12/21 1922     Clinical Impression Statement Patient presented OT evaluation with a diagnosis of her right elbow supracondylar fracture with ORIF on 08/09/2021 after a fall.   Since evaluation patient showed  decrease edema and decreased pain in wrist and digits with increased motion.  She started back since surgery her arthritis medication that helped her rheumatoid arthritis symptoms and pain but then noticed the last 2 weeks patient had increased pain again at rest hand and elbow with strengthening.  Patient report that insurance does not want to cover infusion anymore and has to change to another drug.  Awaiting approval.   Patient continues to show increased motion as well as strength  in right upper extremity with increased functional use..  Continue to encourage patient to use her [facility] neoprene splint for prehension as well as weight and band home exercises. Review with patient's home exercises this date per pt request.  Reinforced  red Thera-Band for elbow extension in supine as well as  2 pound weight for elbow extension/ flexion overhead in supine.  Pt reports continued difficulty with home program and has felt confused and overwhelmed with exercises.  It appears she has difficulty with the format of the home program, variation in repetitions and how to perform select exercises.  Therapist revised home program to reorganize into a spreadsheet with check offs for accountability.  Therapist also adjusted number of repetitions to be more consistent to decrease confusion, ex.  some exercises were 2 sets of 10, 2 sets 12, 3 sets 6, etc.  Changed reps to 3 sets of 6 for more difficult exercises and the rest 2 sets of 10 for exercises she performs more easily. Pt to try spreadsheet over the next few days and return end of the week and add the color photos for the exercises she has difficulty with technique or form.  Patient is limited in her strengthening by her arthritis pain at right wrist and thumb. Patient is right-hand dominant and present with increased scar tissue and pain with  with limited range of motion and decreased strength in right dominant arm limiting her functional use of right hand and ADLs and IADLs.  Patient can benefit from OT services to increase use of right dominant hand.    OT Occupational Profile and History Problem Focused Assessment - Including review of records relating to presenting problem    Occupational performance deficits (Please refer to evaluation for details): ADL's;IADL's;Play;Leisure;Social Participation    Body Structure / Function / Physical Skills ADL;Strength;Decreased knowledge of use of DME;Decreased knowledge of precautions;Flexibility;Scar mobility;ROM;IADL;Edema;UE functional use;Pain;Dexterity    Rehab Potential Good    Clinical Decision Making Limited treatment options, no task modification necessary    Comorbidities Affecting Occupational Performance: None    Modification or Assistance to  Complete Evaluation  Min-Moderate modification of tasks or assist with assess necessary to complete eval    OT Frequency 2x / week    OT Duration 8 weeks    OT Treatment/Interventions Self-care/ADL training;Splinting;Therapeutic exercise;Patient/family education;DME and/or AE instruction;Contrast Bath;Scar mobilization;Cryotherapy;Paraffin;Manual Therapy;Passive range of motion    Consulted and Agree with Plan of Care Patient             Patient will benefit from skilled therapeutic intervention in order to improve the following deficits and impairments:   Body Structure / Function / Physical Skills: ADL, Strength, Decreased knowledge of use of DME, Decreased knowledge of precautions, Flexibility, Scar mobility, ROM, IADL, Edema, UE functional use, Pain, Dexterity       Visit Diagnosis: Pain in right elbow  Stiffness of right wrist, not elsewhere classified  Stiffness of right hand, not elsewhere classified  Pain in both hands  Scar condition and fibrosis of skin  Pain in right wrist  Stiffness of left hand, not elsewhere classified  Muscle weakness (generalized)  Localized edema  Pain in right hand    Problem List Patient Active Problem List   Diagnosis Date Noted   Normocytic anemia 08/11/2021   Hypokalemia 08/09/2021   Displaced comminuted supracondylar fracture of right humerus without intercondylar fracture 08/08/2021   Open fracture of right humerus 08/08/2021   History of DVT (deep vein thrombosis) 06/06/2021   Rheumatoid arthritis  of multiple sites without rheumatoid factor ([place]) 04/24/2021   History of pulmonary embolism 02/04/2020   History of blood clotting disorder 01/12/2020   Immunosuppression ([place]) 01/25/2019   Status post reverse total shoulder replacement, right 09/24/2018   Hyperglycemia 08/31/2018   S/P right colectomy 01/26/2018   Adenomatous polyp of ascending colon 11/24/2017   Generalized anxiety disorder 09/06/2017   Irritable bowel  syndrome (IBS) 09/06/2017   Orthostasis 06/05/2016   Left shoulder pain 10/13/2015   Constipation 05/30/2015   Osteoporosis, postmenopausal 05/30/2015   Compression fracture of L2 lumbar vertebra with delayed healing 05/29/2015   Frequent falls 03/26/2015   Atherosclerosis of arteries 03/26/2015   Family history of colon cancer 09/28/2014   TMJ (temporomandibular joint syndrome) 06/12/2014   Varicose veins of both lower extremities without ulcer or inflammation 06/12/2014   B12 deficiency 09/20/2013   Fatigue 05/23/2013   Internal hemorrhoids without complication 77/03/6578   Insomnia 03/03/2013   Vertigo due to cerebrovascular disease 03/03/2013   Seronegative rheumatoid arthritis affecting lower leg ([place]) 03/03/2013   Teresa Lin, OTR/L, CLT  Teresa Lin, OT 12/13/2021, 10:14 AM  [facility] [address] [place], [address] Phone: [phone]   Fax:  [phone]  Name: Teresa Lin MRN: [id number] Date of Birth: 08/12/1931

## 2021-12-14 ENCOUNTER — Ambulatory Visit: Payer: Medicare Other | Admitting: Occupational Therapy

## 2021-12-14 DIAGNOSIS — M25521 Pain in right elbow: Secondary | ICD-10-CM | POA: Diagnosis not present

## 2021-12-14 DIAGNOSIS — M25531 Pain in right wrist: Secondary | ICD-10-CM

## 2021-12-14 DIAGNOSIS — M6281 Muscle weakness (generalized): Secondary | ICD-10-CM

## 2021-12-14 DIAGNOSIS — M25631 Stiffness of right wrist, not elsewhere classified: Secondary | ICD-10-CM

## 2021-12-14 DIAGNOSIS — M25642 Stiffness of left hand, not elsewhere classified: Secondary | ICD-10-CM

## 2021-12-14 DIAGNOSIS — M79642 Pain in left hand: Secondary | ICD-10-CM

## 2021-12-14 DIAGNOSIS — R600 Localized edema: Secondary | ICD-10-CM

## 2021-12-14 DIAGNOSIS — M25641 Stiffness of right hand, not elsewhere classified: Secondary | ICD-10-CM

## 2021-12-14 DIAGNOSIS — M79641 Pain in right hand: Secondary | ICD-10-CM

## 2021-12-14 DIAGNOSIS — L905 Scar conditions and fibrosis of skin: Secondary | ICD-10-CM

## 2021-12-16 ENCOUNTER — Encounter: Payer: Self-pay | Admitting: Occupational Therapy

## 2021-12-16 NOTE — Therapy (Signed)
[facility] [address], [place], [address] Phone: [phone]   Fax:  [phone]  Occupational Therapy Treatment/Progress Update Reporting period from 10/25/2021 to 12/14/2021  Patient Details  Name: Teresa Lin MRN: [id number] Date of Birth: 06/03/1931 Referring Provider (OT): Dr Marcelino Scot   Encounter Date: 12/14/2021   OT End of Session - 12/16/21 1135     Visit Number 20    Number of Visits 21    Date for OT Re-Evaluation 01/01/22    OT Start Time 0915    OT Stop Time 1000    OT Time Calculation (min) 45 min    Activity Tolerance Patient tolerated treatment well    Behavior During Therapy [facility] for tasks assessed/performed             Past Medical History:  Diagnosis Date   Anemia    Anxiety    Arthritis    osteoarthritis. Bilateral knee replacement, hands, meniscal tear, cervical disc disease   Atherosclerosis    Atypical chest pain    Collagen vascular disease (HCC)    Colon polyp 12/15/2017   Colon polyps    Compression fracture of L2 (HCC)    Diverticulosis    DVT (deep venous thrombosis) (HCC)    Fracture of one rib, right side, subsequent encounter for fracture with routine healing 03/08/2017   GERD (gastroesophageal reflux disease)    History of seronegative inflammatory arthritis    HOH (hard of hearing)    no hearing aids   Hypertension    IBS (irritable bowel syndrome)    Internal hemorrhoids    Phlebitis and thrombophlebitis of deep veins of lower extremities ([place])    after surgery   Vision abnormalities     Past Surgical History:  Procedure Laterality Date   APPENDECTOMY     COLONOSCOPY N/A 01/24/2020   Procedure: COLONOSCOPY;  Surgeon: Lesly Rubenstein, MD;  Location: [facility] ENDOSCOPY;  Service: Endoscopy;  Laterality: N/A;   COLONOSCOPY WITH PROPOFOL N/A 10/24/2017   Procedure: COLONOSCOPY WITH PROPOFOL;  Surgeon: Manya Silvas, MD;  Location: [facility] ENDOSCOPY;   Service: Endoscopy;  Laterality: N/A;   COLONOSCOPY WITH PROPOFOL N/A 11/21/2017   Procedure: COLONOSCOPY WITH PROPOFOL;  Surgeon: Manya Silvas, MD;  Location: [facility] ENDOSCOPY;  Service: Endoscopy;  Laterality: N/A;   DILATION AND CURETTAGE OF UTERUS     ESOPHAGOGASTRODUODENOSCOPY (EGD) WITH PROPOFOL N/A 02/25/2018   Procedure: ESOPHAGOGASTRODUODENOSCOPY (EGD) WITH PROPOFOL;  Surgeon: Manya Silvas, MD;  Location: [facility] ENDOSCOPY;  Service: Endoscopy;  Laterality: N/A;   EYE SURGERY     bilateral catarACT   JOINT REPLACEMENT Bilateral    knee replacement   LAPAROSCOPIC RIGHT COLECTOMY Right 12/15/2017   Procedure: LAPAROSCOPIC RIGHT COLECTOMY;  Surgeon: Robert Bellow, MD;  Location: [facility] ORS;  Service: General;  Laterality: Right;   ORIF HUMERUS FRACTURE Right 08/09/2021   Procedure: IRRIGATION AND DEBRIDEMENT; OPEN REDUCTION INTERNAL FIXATION (ORIF) DISTAL HUMERUS FRACTURE;  Surgeon: Altamese Lipscomb, MD;  Location: [place];  Service: Orthopedics;  Laterality: Right;   REPLACEMENT TOTAL KNEE BILATERAL     TOTAL SHOULDER ARTHROPLASTY Right 09/24/2018   Procedure: TOTAL SHOULDER ARTHROPLASTY - REVERSE RIGHT;  Surgeon: Corky Mull, MD;  Location: [facility] ORS;  Service: Orthopedics;  Laterality: Right;    There were no vitals filed for this visit.   Subjective Assessment - 12/16/21 1134     Subjective  Pt reports she did well this week with exercises, reports  it was a great help to revise her exercise list and have a check off for accountability.  She reports she did exercises most days but one day she was busy with spending time with her grandson and did not do them.    Pertinent History Pt is an 88 y.o. female admitted 08/08/21 after fall sustaining open R supracondylar distal humerus fx. S/p R humeral ORIF, ulnar nerve neuroplasty 3/30 by Dr Marcelino Scot. PMH includes HTN, IBS, DVT/PE, arthritis, anxiety, HOH, multiple joint replacements. Pt was in hospital recieving OT and PT followed by [facility]. Pt  refer to OT with increase pain and swelling in R hand and wrist after fall. Had to stop her arthritis medication while when she had to had surgery.    Patient Stated Goals I am right-handed so I really need my elbow, wrist and hand.  I want to get my motion and strength better in my right arm and hand, wrist so that I can do the things around the house that I need to do.                [facility] OT Assessment - 12/16/21 1138       AROM   Right Elbow Flexion 132    Right Elbow Extension -5    Right Forearm Pronation 90 Degrees    Right Forearm Supination 85 Degrees    Right Wrist Extension 30 Degrees    Right Wrist Flexion 40 Degrees    Right Wrist Radial Deviation 8 Degrees    Right Wrist Ulnar Deviation 30 Degrees      Strength   Right Hand Grip (lbs) 21    Right Hand Lateral Pinch 10 lbs    Right Hand 3 Point Pinch 7 lbs    Left Hand Grip (lbs) 35    Left Hand Lateral Pinch 8 lbs    Left Hand 3 Point Pinch 8 lbs            Rationale for Evaluation and Treatment Rehabilitation  Paraffin:  Paraffin to right elbow and hand for 8 mins, added additional heat to increase tissue mobility and ROM.    Therapeutic Exercises:  Review with demonstration of current home exercise program.  Revised program format last session to a new spreadsheet with directions and check off for accountability and for ease of patient to keep track of what exercises she has performed and when.   She was able to use the new format successfully and also the new pictures for reference for how to perform exercises.   Therapist revised spreadsheet to include each day of the week on the form so she can just have a 2 page exercise list with check off for each week (rather than a separate sheet for each day).  Therapist also printed off pictures of patient performing her exercises in color so she can reference them at home as another option besides her phone.  She did well with exercises this date.  She will  continue with current program and reassess any changes next session.    Measurements taken this date, see flow sheet for details. Goals updated to reflect progress.                    OT Education - 12/16/21 1135     Education Details Continued Reorganization of home exercise program to promote clarity and consistency.    Person(s) Educated Patient    Methods Explanation;Demonstration;Tactile cues;Verbal cues;Handout    Comprehension Verbal cues required;Returned  demonstration;Verbalized understanding              OT Short Term Goals - 10/30/21 1044       OT SHORT TERM GOAL #1   Title Pt to be independent in HEP to increase AROM in right elbow, wrist and hand  with less pain    Baseline Elbow extension -30, flexion 130, supination 75 with all pain increased endrange.  Wrist extension decreased as well as flexion compared to September last year  NOW ext -22 to -5 and flexion 140-pain still with PROM end range and weight    Status Achieved               OT Long Term Goals - 12/16/21 1140       OT LONG TERM GOAL #1   Title Right elbow flexion increased for patient to be able to put her hearing aids in and fix her hair with right dominant hand with pain less than 3/10    Baseline Patient reports she can eat as well as put her hearing aids in now but still hard to fix her hair in the back as well as pushing the door open or raking.  Brushing teeth is also a little hard-pain increased somewhat the last 2 weeks because of changes in her infusion drug for arthritis    Time 6    Period Weeks    Status Partially Met    Target Date 01/01/22      OT LONG TERM GOAL #2   Title Right elbow extension increased with more than 15 degrees for patient to be able to reach in pocket and shelves without pain.    Baseline Elbow extension range of motion increased to close to normal limits in session she can reach up overhead and into pockets, to pull up pants.  Strength still  lacking cannot push the door open or push up from a chair or rake outside.6    Status Achieved      OT LONG TERM GOAL #3   Title Right elbow flexion extension increased for patient to be able to push heavy door open, turn a doorknob and carrying more than 5 pounds without increase symptoms    Baseline Patient is range of motion increased greatly working on last 10- 20 degrees but in session able to get nearly full motion.  Increased pain the last 2 weeks because of changes in her infusion drug for arthritis.  Strength improving but still hard time doing raking, pushing door open or pushing up from a chair.  As well as fixing hair in the back of the head were brushing teeth.  But able to initiate more strengthening.    Time 6    Period Weeks    Status Partially Met    Target Date 01/01/22                   Plan - 12/16/21 1136     Clinical Impression Statement Patient presented OT evaluation with a diagnosis of her right elbow supracondylar fracture with ORIF on 08/09/2021 after a fall.   Since evaluation patient showed  decrease edema and decreased pain in wrist and digits with increased motion.  She started back since surgery her arthritis medication that helped her rheumatoid arthritis symptoms and pain but then noticed the last 2 weeks patient had increased pain again at rest hand and elbow with strengthening.  Patient report that insurance does not want to cover infusion anymore and has  to change to another drug.  Awaiting approval.   Patient continues to show increased motion as well as strength in right upper extremity with increased functional use..  Continue to encourage patient to use her [facility] neoprene splint for prehension as well as weight and band home exercises.  Reinforced red Thera-Band for elbow extension in supine as well as  2 pound weight for elbow extension/ flexion overhead in supine. Therapist revised home program to reorganize into a spreadsheet with check offs for  accountability.  Therapist also adjusted number of repetitions to be more consistent to decrease confusion, ex.  some exercises were 2 sets of 10, 2 sets 12, 3 sets 6, etc.  Changed reps to 3 sets of 6 for more difficult exercises and the rest 2 sets of 10 for exercises she performs more easily. Pt did well with new format of exercises on spreadsheet/check off along with pictures.  Improvements noted this date in elbow extension to -5 and ulnar and radial deviation.  She has continued to progress, will continue to streamline her exercise program for home.   Patient is limited in her strengthening by her arthritis pain at right wrist and thumb. Patient is right-hand dominant and present with increased scar tissue and pain with  with limited range of motion and decreased strength in right dominant arm limiting her functional use of right hand and ADLs and IADLs.  Patient can benefit from OT services to increase use of right dominant hand.    OT Occupational Profile and History Problem Focused Assessment - Including review of records relating to presenting problem    Occupational performance deficits (Please refer to evaluation for details): ADL's;IADL's;Play;Leisure;Social Participation    Body Structure / Function / Physical Skills ADL;Strength;Decreased knowledge of use of DME;Decreased knowledge of precautions;Flexibility;Scar mobility;ROM;IADL;Edema;UE functional use;Pain;Dexterity    Rehab Potential Good    Clinical Decision Making Limited treatment options, no task modification necessary    Comorbidities Affecting Occupational Performance: None    Modification or Assistance to Complete Evaluation  Min-Moderate modification of tasks or assist with assess necessary to complete eval    OT Frequency 2x / week    OT Duration 8 weeks    OT Treatment/Interventions Self-care/ADL training;Splinting;Therapeutic exercise;Patient/family education;DME and/or AE instruction;Contrast Bath;Scar  mobilization;Cryotherapy;Paraffin;Manual Therapy;Passive range of motion    Consulted and Agree with Plan of Care Patient             Patient will benefit from skilled therapeutic intervention in order to improve the following deficits and impairments:   Body Structure / Function / Physical Skills: ADL, Strength, Decreased knowledge of use of DME, Decreased knowledge of precautions, Flexibility, Scar mobility, ROM, IADL, Edema, UE functional use, Pain, Dexterity       Visit Diagnosis: Pain in right elbow  Stiffness of right wrist, not elsewhere classified  Stiffness of right hand, not elsewhere classified  Pain in both hands  Scar condition and fibrosis of skin  Pain in right wrist  Stiffness of left hand, not elsewhere classified  Muscle weakness (generalized)  Localized edema  Pain in right hand    Problem List Patient Active Problem List   Diagnosis Date Noted   Normocytic anemia 08/11/2021   Hypokalemia 08/09/2021   Displaced comminuted supracondylar fracture of right humerus without intercondylar fracture 08/08/2021   Open fracture of right humerus 08/08/2021   History of DVT (deep vein thrombosis) 06/06/2021   Rheumatoid arthritis of multiple sites without rheumatoid factor ([place]) 04/24/2021   History of pulmonary embolism  02/04/2020   History of blood clotting disorder 01/12/2020   Immunosuppression ([place]) 01/25/2019   Status post reverse total shoulder replacement, right 09/24/2018   Hyperglycemia 08/31/2018   S/P right colectomy 01/26/2018   Adenomatous polyp of ascending colon 11/24/2017   Generalized anxiety disorder 09/06/2017   Irritable bowel syndrome (IBS) 09/06/2017   Orthostasis 06/05/2016   Left shoulder pain 10/13/2015   Constipation 05/30/2015   Osteoporosis, postmenopausal 05/30/2015   Compression fracture of L2 lumbar vertebra with delayed healing 05/29/2015   Frequent falls 03/26/2015   Atherosclerosis of arteries 03/26/2015   Family  history of colon cancer 09/28/2014   TMJ (temporomandibular joint syndrome) 06/12/2014   Varicose veins of both lower extremities without ulcer or inflammation 06/12/2014   B12 deficiency 09/20/2013   Fatigue 05/23/2013   Internal hemorrhoids without complication 23/30/0762   Insomnia 03/03/2013   Vertigo due to cerebrovascular disease 03/03/2013   Seronegative rheumatoid arthritis affecting lower leg ([place]) 03/03/2013   Kodah Maret T Jacquel Redditt, OTR/L, CLT  Tzivia Oneil, OT 12/16/2021, 11:51 AM  [facility] [address], [place], [address] Phone: [phone]   Fax:  [phone]  Name: TIYAH ZELENAK MRN: [id number] Date of Birth: 04-16-1932

## 2021-12-17 ENCOUNTER — Encounter: Payer: Self-pay | Admitting: Occupational Therapy

## 2021-12-17 ENCOUNTER — Ambulatory Visit: Payer: Medicare Other | Admitting: Occupational Therapy

## 2021-12-17 DIAGNOSIS — M79641 Pain in right hand: Secondary | ICD-10-CM

## 2021-12-17 DIAGNOSIS — L905 Scar conditions and fibrosis of skin: Secondary | ICD-10-CM

## 2021-12-17 DIAGNOSIS — M25631 Stiffness of right wrist, not elsewhere classified: Secondary | ICD-10-CM

## 2021-12-17 DIAGNOSIS — M25521 Pain in right elbow: Secondary | ICD-10-CM

## 2021-12-17 DIAGNOSIS — M25641 Stiffness of right hand, not elsewhere classified: Secondary | ICD-10-CM

## 2021-12-17 DIAGNOSIS — M6281 Muscle weakness (generalized): Secondary | ICD-10-CM

## 2021-12-22 NOTE — Therapy (Signed)
[facility] [address], [place], [address] Phone: [phone]   Fax:  [phone]  Occupational Therapy Treatment  Patient Details  Name: Teresa Lin MRN: [id number] Date of Birth: April 15, 1932 Referring Provider (OT): Dr Marcelino Scot   Encounter Date: 12/17/2021   OT End of Session - 12/22/21 1538     Visit Number 21    Number of Visits 21    Date for OT Re-Evaluation 01/01/22    OT Start Time 0819    OT Stop Time 0900    OT Time Calculation (min) 41 min    Activity Tolerance Patient tolerated treatment well    Behavior During Therapy [facility] for tasks assessed/performed             Past Medical History:  Diagnosis Date   Anemia    Anxiety    Arthritis    osteoarthritis. Bilateral knee replacement, hands, meniscal tear, cervical disc disease   Atherosclerosis    Atypical chest pain    Collagen vascular disease (HCC)    Colon polyp 12/15/2017   Colon polyps    Compression fracture of L2 (HCC)    Diverticulosis    DVT (deep venous thrombosis) (HCC)    Fracture of one rib, right side, subsequent encounter for fracture with routine healing 03/08/2017   GERD (gastroesophageal reflux disease)    History of seronegative inflammatory arthritis    HOH (hard of hearing)    no hearing aids   Hypertension    IBS (irritable bowel syndrome)    Internal hemorrhoids    Phlebitis and thrombophlebitis of deep veins of lower extremities ([place])    after surgery   Vision abnormalities     Past Surgical History:  Procedure Laterality Date   APPENDECTOMY     COLONOSCOPY N/A 01/24/2020   Procedure: COLONOSCOPY;  Surgeon: Lesly Rubenstein, MD;  Location: [facility] ENDOSCOPY;  Service: Endoscopy;  Laterality: N/A;   COLONOSCOPY WITH PROPOFOL N/A 10/24/2017   Procedure: COLONOSCOPY WITH PROPOFOL;  Surgeon: Manya Silvas, MD;  Location: [facility] ENDOSCOPY;  Service: Endoscopy;  Laterality: N/A;   COLONOSCOPY WITH PROPOFOL  N/A 11/21/2017   Procedure: COLONOSCOPY WITH PROPOFOL;  Surgeon: Manya Silvas, MD;  Location: [facility] ENDOSCOPY;  Service: Endoscopy;  Laterality: N/A;   DILATION AND CURETTAGE OF UTERUS     ESOPHAGOGASTRODUODENOSCOPY (EGD) WITH PROPOFOL N/A 02/25/2018   Procedure: ESOPHAGOGASTRODUODENOSCOPY (EGD) WITH PROPOFOL;  Surgeon: Manya Silvas, MD;  Location: [facility] ENDOSCOPY;  Service: Endoscopy;  Laterality: N/A;   EYE SURGERY     bilateral catarACT   JOINT REPLACEMENT Bilateral    knee replacement   LAPAROSCOPIC RIGHT COLECTOMY Right 12/15/2017   Procedure: LAPAROSCOPIC RIGHT COLECTOMY;  Surgeon: Robert Bellow, MD;  Location: [facility] ORS;  Service: General;  Laterality: Right;   ORIF HUMERUS FRACTURE Right 08/09/2021   Procedure: IRRIGATION AND DEBRIDEMENT; OPEN REDUCTION INTERNAL FIXATION (ORIF) DISTAL HUMERUS FRACTURE;  Surgeon: Altamese Lima, MD;  Location: [place];  Service: Orthopedics;  Laterality: Right;   REPLACEMENT TOTAL KNEE BILATERAL     TOTAL SHOULDER ARTHROPLASTY Right 09/24/2018   Procedure: TOTAL SHOULDER ARTHROPLASTY - REVERSE RIGHT;  Surgeon: Corky Mull, MD;  Location: [facility] ORS;  Service: Orthopedics;  Laterality: Right;    There were no vitals filed for this visit.   Subjective Assessment - 12/21/21 1537     Subjective  Pt reports she did well this week with her exercises, reports the new check off format has  really helped.    Pertinent History Pt is an 88 y.o. female admitted 08/08/21 after fall sustaining open R supracondylar distal humerus fx. S/p R humeral ORIF, ulnar nerve neuroplasty 3/30 by Dr Marcelino Scot. PMH includes HTN, IBS, DVT/PE, arthritis, anxiety, HOH, multiple joint replacements. Pt was in hospital recieving OT and PT followed by [facility]. Pt refer to OT with increase pain and swelling in R hand and wrist after fall. Had to stop her arthritis medication while when she had to had surgery.    Patient Stated Goals I am right-handed so I really need my elbow, wrist and hand.   I want to get my motion and strength better in my right arm and hand, wrist so that I can do the things around the house that I need to do.    Currently in Pain? No/denies    Pain Score 0-No pain             Rationale for Evaluation and Treatment Rehabilitation  Paraffin:  Paraffin to right elbow and hand for 8 mins, added additional heat to increase tissue mobility and ROM.      Therapeutic Exercises: Elbow flexion with interlocking hands to place hands behind head with 30 sec hold.  Reaching to attempt touching each ear, 10 reps.   Prolonged stretch for elbow extension followed by attempts at place and hold with elbow extended.   Supine elbow extension with red theraband, cues for band placement, left hand stabilizing at right shoulder and pushing down for elbow extension.  2# weight for elbow extension for 2 sets 10 reps with shoulder flexed to 90 degrees.  Upper arm to side for extension to hip flexion to shoulder for 2 sets of 10 with cues for proper form and technique. Supination with brace in place 2 sets of 10 reps.    Posture exercises standing at the wall with cues for shoulder retraction, head back , chin in a neutral position., hold for 10 secs, 10 reps.     Blue putty with therapist demo for HEP for gross grasping, 3 point pinch, cues for monitoring motions and pain.    Opposition of thumb to each digit.                     OT Education - 12/21/21 1538     Education Details home exercises and use of exercise log    Person(s) Educated Patient    Methods Explanation;Demonstration;Tactile cues;Verbal cues;Handout    Comprehension Verbal cues required;Returned demonstration;Verbalized understanding              OT Short Term Goals - 10/30/21 1044       OT SHORT TERM GOAL #1   Title Pt to be independent in HEP to increase AROM in right elbow, wrist and hand  with less pain    Baseline Elbow extension -30, flexion 130, supination 75 with all pain  increased endrange.  Wrist extension decreased as well as flexion compared to September last year  NOW ext -22 to -5 and flexion 140-pain still with PROM end range and weight    Status Achieved               OT Long Term Goals - 12/16/21 1140       OT LONG TERM GOAL #1   Title Right elbow flexion increased for patient to be able to put her hearing aids in and fix her hair with right dominant hand with pain less than 3/10  Baseline Patient reports she can eat as well as put her hearing aids in now but still hard to fix her hair in the back as well as pushing the door open or raking.  Brushing teeth is also a little hard-pain increased somewhat the last 2 weeks because of changes in her infusion drug for arthritis    Time 6    Period Weeks    Status Partially Met    Target Date 01/01/22      OT LONG TERM GOAL #2   Title Right elbow extension increased with more than 15 degrees for patient to be able to reach in pocket and shelves without pain.    Baseline Elbow extension range of motion increased to close to normal limits in session she can reach up overhead and into pockets, to pull up pants.  Strength still lacking cannot push the door open or push up from a chair or rake outside.6    Status Achieved      OT LONG TERM GOAL #3   Title Right elbow flexion extension increased for patient to be able to push heavy door open, turn a doorknob and carrying more than 5 pounds without increase symptoms    Baseline Patient is range of motion increased greatly working on last 10- 20 degrees but in session able to get nearly full motion.  Increased pain the last 2 weeks because of changes in her infusion drug for arthritis.  Strength improving but still hard time doing raking, pushing door open or pushing up from a chair.  As well as fixing hair in the back of the head were brushing teeth.  But able to initiate more strengthening.    Time 6    Period Weeks    Status Partially Met    Target Date  01/01/22                   Plan - 12/22/21 1539     Clinical Impression Statement Patient presented OT evaluation with a diagnosis of her right elbow supracondylar fracture with ORIF on 08/09/2021 after a fall.   Since evaluation patient showed  decrease edema and decreased pain in wrist and digits with increased motion.  She started back since surgery her arthritis medication that helped her rheumatoid arthritis symptoms and pain but then noticed the last 2 weeks patient had increased pain again at rest hand and elbow with strengthening.  Patient report that insurance does not want to cover infusion anymore and has to change to another drug.  Awaiting approval.   Patient continues to show increased motion as well as strength in right upper extremity with increased functional use. Continue to encourage patient to use her [facility] neoprene splint for prehension as well as weight and band home exercises.  Reinforced red Thera-Band for elbow extension in supine as well as  2 pound weight for elbow extension/ flexion overhead in supine. Therapist revised home program to reorganize into a spreadsheet with check offs for accountability.  Therapist also adjusted number of repetitions to be more consistent to decrease confusion, ex.  some exercises were 2 sets of 10, 2 sets 12, 3 sets 6, etc.  Changed reps to 3 sets of 6 for more difficult exercises and the rest 2 sets of 10 for exercises she performs more easily. Pt continues to perform well with new format of exercises on spreadsheet/check off along with pictures.  Patient continues to  benefit from OT services to increase use of right dominant  hand in daily activities.    OT Occupational Profile and History Problem Focused Assessment - Including review of records relating to presenting problem    Occupational performance deficits (Please refer to evaluation for details): ADL's;IADL's;Play;Leisure;Social Participation    Body Structure / Function / Physical  Skills ADL;Strength;Decreased knowledge of use of DME;Decreased knowledge of precautions;Flexibility;Scar mobility;ROM;IADL;Edema;UE functional use;Pain;Dexterity    Rehab Potential Good    Clinical Decision Making Limited treatment options, no task modification necessary    Comorbidities Affecting Occupational Performance: None    Modification or Assistance to Complete Evaluation  Min-Moderate modification of tasks or assist with assess necessary to complete eval    OT Frequency 2x / week    OT Duration 8 weeks    OT Treatment/Interventions Self-care/ADL training;Splinting;Therapeutic exercise;Patient/family education;DME and/or AE instruction;Contrast Bath;Scar mobilization;Cryotherapy;Paraffin;Manual Therapy;Passive range of motion    Consulted and Agree with Plan of Care Patient             Patient will benefit from skilled therapeutic intervention in order to improve the following deficits and impairments:   Body Structure / Function / Physical Skills: ADL, Strength, Decreased knowledge of use of DME, Decreased knowledge of precautions, Flexibility, Scar mobility, ROM, IADL, Edema, UE functional use, Pain, Dexterity       Visit Diagnosis: Pain in right elbow  Stiffness of right wrist, not elsewhere classified  Stiffness of right hand, not elsewhere classified  Pain in both hands  Scar condition and fibrosis of skin  Muscle weakness (generalized)    Problem List Patient Active Problem List   Diagnosis Date Noted   Normocytic anemia 08/11/2021   Hypokalemia 08/09/2021   Displaced comminuted supracondylar fracture of right humerus without intercondylar fracture 08/08/2021   Open fracture of right humerus 08/08/2021   History of DVT (deep vein thrombosis) 06/06/2021   Rheumatoid arthritis of multiple sites without rheumatoid factor ([place]) 04/24/2021   History of pulmonary embolism 02/04/2020   History of blood clotting disorder 01/12/2020   Immunosuppression ([place])  01/25/2019   Status post reverse total shoulder replacement, right 09/24/2018   Hyperglycemia 08/31/2018   S/P right colectomy 01/26/2018   Adenomatous polyp of ascending colon 11/24/2017   Generalized anxiety disorder 09/06/2017   Irritable bowel syndrome (IBS) 09/06/2017   Orthostasis 06/05/2016   Left shoulder pain 10/13/2015   Constipation 05/30/2015   Osteoporosis, postmenopausal 05/30/2015   Compression fracture of L2 lumbar vertebra with delayed healing 05/29/2015   Frequent falls 03/26/2015   Atherosclerosis of arteries 03/26/2015   Family history of colon cancer 09/28/2014   TMJ (temporomandibular joint syndrome) 06/12/2014   Varicose veins of both lower extremities without ulcer or inflammation 06/12/2014   B12 deficiency 09/20/2013   Fatigue 05/23/2013   Internal hemorrhoids without complication 87/56/4332   Insomnia 03/03/2013   Vertigo due to cerebrovascular disease 03/03/2013   Seronegative rheumatoid arthritis affecting lower leg (HCC) 03/03/2013   Josephine Rudnick T Akil Hoos, OTR/L, CLT  Rafe Mackowski, OT 12/22/2021, 3:48 PM  [facility] [address] [place], [address] Phone: [phone]   Fax:  [phone]  Name: Teresa Lin MRN: [id number] Date of Birth: 12/03/1931

## 2021-12-24 ENCOUNTER — Ambulatory Visit: Payer: Medicare Other | Admitting: Occupational Therapy

## 2021-12-24 DIAGNOSIS — M25631 Stiffness of right wrist, not elsewhere classified: Secondary | ICD-10-CM

## 2021-12-24 DIAGNOSIS — L905 Scar conditions and fibrosis of skin: Secondary | ICD-10-CM

## 2021-12-24 DIAGNOSIS — M79641 Pain in right hand: Secondary | ICD-10-CM

## 2021-12-24 DIAGNOSIS — M25521 Pain in right elbow: Secondary | ICD-10-CM

## 2021-12-24 DIAGNOSIS — M25641 Stiffness of right hand, not elsewhere classified: Secondary | ICD-10-CM

## 2021-12-24 NOTE — Therapy (Signed)
[facility] [address] [place], [address] Phone: [phone]   Fax:  [phone]  Occupational Therapy Treatment  Patient Details  Name: Teresa Lin MRN: [id number] Date of Birth: 11-27-1931 Referring Provider (OT): Dr Marcelino Scot   Encounter Date: 12/24/2021   OT End of Session - 12/24/21 0830     Visit Number 22    Number of Visits 24    Date for OT Re-Evaluation 02/04/22    OT Start Time 0830    OT Stop Time 0900    OT Time Calculation (min) 30 min    Activity Tolerance Patient tolerated treatment well    Behavior During Therapy [facility] for tasks assessed/performed             Past Medical History:  Diagnosis Date   Anemia    Anxiety    Arthritis    osteoarthritis. Bilateral knee replacement, hands, meniscal tear, cervical disc disease   Atherosclerosis    Atypical chest pain    Collagen vascular disease (HCC)    Colon polyp 12/15/2017   Colon polyps    Compression fracture of L2 (HCC)    Diverticulosis    DVT (deep venous thrombosis) (HCC)    Fracture of one rib, right side, subsequent encounter for fracture with routine healing 03/08/2017   GERD (gastroesophageal reflux disease)    History of seronegative inflammatory arthritis    HOH (hard of hearing)    no hearing aids   Hypertension    IBS (irritable bowel syndrome)    Internal hemorrhoids    Phlebitis and thrombophlebitis of deep veins of lower extremities ([place])    after surgery   Vision abnormalities     Past Surgical History:  Procedure Laterality Date   APPENDECTOMY     COLONOSCOPY N/A 01/24/2020   Procedure: COLONOSCOPY;  Surgeon: Teresa Rubenstein, MD;  Location: [facility] ENDOSCOPY;  Service: Endoscopy;  Laterality: N/A;   COLONOSCOPY WITH PROPOFOL N/A 10/24/2017   Procedure: COLONOSCOPY WITH PROPOFOL;  Surgeon: Teresa Silvas, MD;  Location: [facility] ENDOSCOPY;  Service: Endoscopy;  Laterality: N/A;   COLONOSCOPY WITH PROPOFOL  N/A 11/21/2017   Procedure: COLONOSCOPY WITH PROPOFOL;  Surgeon: Teresa Silvas, MD;  Location: [facility] ENDOSCOPY;  Service: Endoscopy;  Laterality: N/A;   DILATION AND CURETTAGE OF UTERUS     ESOPHAGOGASTRODUODENOSCOPY (EGD) WITH PROPOFOL N/A 02/25/2018   Procedure: ESOPHAGOGASTRODUODENOSCOPY (EGD) WITH PROPOFOL;  Surgeon: Teresa Silvas, MD;  Location: [facility] ENDOSCOPY;  Service: Endoscopy;  Laterality: N/A;   EYE SURGERY     bilateral catarACT   JOINT REPLACEMENT Bilateral    knee replacement   LAPAROSCOPIC RIGHT COLECTOMY Right 12/15/2017   Procedure: LAPAROSCOPIC RIGHT COLECTOMY;  Surgeon: Teresa Bellow, MD;  Location: [facility] ORS;  Service: General;  Laterality: Right;   ORIF HUMERUS FRACTURE Right 08/09/2021   Procedure: IRRIGATION AND DEBRIDEMENT; OPEN REDUCTION INTERNAL FIXATION (ORIF) DISTAL HUMERUS FRACTURE;  Surgeon: Teresa Chelyan, MD;  Location: [place];  Service: Orthopedics;  Laterality: Right;   REPLACEMENT TOTAL KNEE BILATERAL     TOTAL SHOULDER ARTHROPLASTY Right 09/24/2018   Procedure: TOTAL SHOULDER ARTHROPLASTY - REVERSE RIGHT;  Surgeon: Teresa Mull, MD;  Location: [facility] ORS;  Service: Orthopedics;  Laterality: Right;    There were no vitals filed for this visit.   Subjective Assessment - 12/24/21 0830     Subjective  I am doing okay.  Better.  Still feel like maybe strength with pushing is not there  yet.  My arthritis is doing okay.  And then I cannot get my hands flat on my cheeks to wash my face.  Put lotion on.    Pertinent History Pt is an 88 y.o. female admitted 08/08/21 after fall sustaining open R supracondylar distal humerus fx. S/p R humeral ORIF, ulnar nerve neuroplasty 3/30 by Dr Marcelino Scot. PMH includes HTN, IBS, DVT/PE, arthritis, anxiety, HOH, multiple joint replacements. Pt was in hospital recieving OT and PT followed by [facility]. Pt refer to OT with increase pain and swelling in R hand and wrist after fall. Had to stop her arthritis medication while when she had to had  surgery.    Patient Stated Goals I am right-handed so I really need my elbow, wrist and hand.  I want to get my motion and strength better in my right arm and hand, wrist so that I can do the things around the house that I need to do.    Currently in Pain? No/denies                [facility] OT Assessment - 12/24/21 0001       AROM   Right Elbow Flexion 138    Right Elbow Extension -5    Right Forearm Pronation 90 Degrees    Right Forearm Supination 85 Degrees    Right Wrist Extension 30 Degrees    Right Wrist Flexion 40 Degrees    Right Wrist Radial Deviation 8 Degrees    Right Wrist Ulnar Deviation 30 Degrees      Strength   Right Hand Grip (lbs) 26    Right Hand Lateral Pinch 10 lbs    Right Hand 3 Point Pinch 7 lbs    Left Hand Grip (lbs) 35    Left Hand Lateral Pinch 8 lbs    Left Hand 3 Point Pinch 7 lbs                 Patient made great progress from start of care.  Patient grip strength increase to 26 pounds.  Was before start of care 15 to 21 pounds.  Prehension strength compared to left hand same and within functional limits. Wrist active range of motion and strength back to prior level of function. Elbow flexion 138 continue to improve but patient to focus on passive range of motion in the shower in the morning to work on endrange to be able to wash face with flat hand putting earrings in or hearing aids with more ease. Elbow extension -5. Patient strength and elbow flexion 4+/5. Elbow extension improved from 90 degrees to extension.  But continues to be weaker from flexion to 90 with pushing heavy door.  Change patient's red Thera-Band exercise to work on fist 90 degrees of extension motion. Also contacted [facility] about tai chi class to decrease fall risk and increase balance. Next class start 12th September -recommended patient to do that. Patient done water exercises classes before. Patient to contact the [facility] and the [facility] about water aerobic  exercises classes to work on right upper extremity strengthening as well as overall conditioning for arthritis in multiple joints. Patient in agreement patient to transfer to community exercises and follow-up with me in 3 weeks.               OT Education - 12/24/21 0830     Education Details home exercises/community exercise programs and use of exercise log    Person(s) Educated Patient    Methods Explanation;Demonstration;Tactile cues;Verbal  cues;Handout    Comprehension Verbal cues required;Returned demonstration;Verbalized understanding              OT Short Term Goals - 10/30/21 1044       OT SHORT TERM GOAL #1   Title Pt to be independent in HEP to increase AROM in right elbow, wrist and hand  with less pain    Baseline Elbow extension -30, flexion 130, supination 75 with all pain increased endrange.  Wrist extension decreased as well as flexion compared to September last year  NOW ext -22 to -5 and flexion 140-pain still with PROM end range and weight    Status Achieved               OT Long Term Goals - 12/24/21 0944       OT LONG TERM GOAL #1   Title Right elbow flexion increased for patient to be able to put her hearing aids in and fix her hair with right dominant hand with pain less than 3/10    Baseline Patient reports she can eat as well as put her hearing aids in now but still hard to fix her hair in the back as well as pushing the door open or raking.  Brushing teeth is also a little hard-pain increased somewhat the last 2 weeks because of changes in her infusion drug for arthritis - Pt cannot wash face flat hand, earrings - 138 flexion at elvow    Time 6    Period Weeks    Status On-going    Target Date 02/04/22      OT LONG TERM GOAL #2   Title Right elbow extension increased with more than 15 degrees for patient to be able to reach in pocket and shelves without pain.    Status Achieved      OT LONG TERM GOAL #3   Title Right elbow flexion  extension increased for patient to be able to push heavy door open, turn a doorknob and carrying more than 5 pounds without increase symptoms    Baseline Elbow extension -5, flexion 138, patient able to carry 10 pounds left and hand OT 7 pounds.  No pain.  Just heavy.  Can pull door but cannot push heavy door open    Time 6    Period Weeks    Status On-going    Target Date 02/04/22      OT LONG TERM GOAL #4   Title Patient's pain, swelling and active range of motion increased to same than last year.    Status Achieved                   Plan - 12/24/21 0831     Clinical Impression Statement Patient presented OT evaluation with a diagnosis of her right elbow supracondylar fracture with ORIF on 08/09/2021 after a fall.   Since evaluation patient showed  decrease edema and decreased pain in wrist and digits with increased motion.  She started back since surgery her arthritis medication that helped her rheumatoid arthritis symptoms and pain. Pt cont to show increased motion as well as strength in right upper extremity with increased functional use. Continue to encourage patient to use her [facility] neoprene splint with strengthening HEP.  Patient's grip strength increase.  Prehension compared to left within functional limits.  Patient to continue to work on passive range of motion for elbow flexion endrange in the shower to be able to wash her face with flat hand.  Patient  was able to carry 10 pounds but heavy.  Was able to carry 7 pounds and pick up and handed to OT.  With no pain.  Recommend for patient at this stage to get in with some water exercise classes at the [facility] or [facility] will be good for strengthening of right upper extremity but also for arthritis.  Also recommended patient do tai chi class for arthritis as [facility] to increase balance and decrease fall risk. Next class starts 12 Sept.  Patient will follow-up with me in 3 weeks again.  Patient to initiate community exercise in the  meantime.  Patient's elbow strength increased in endrange elbow flexion 4+/5.  Forearm and wrist strength back to prior to fall.   Patient continues to  benefit from OT services to increase use of right dominant hand  and UE and assist pt transfering to comminity exercise classes to increase independence in daily activities.    OT Occupational Profile and History Problem Focused Assessment - Including review of records relating to presenting problem    Occupational performance deficits (Please refer to evaluation for details): ADL's;IADL's;Play;Leisure;Social Participation    Body Structure / Function / Physical Skills ADL;Strength;Decreased knowledge of use of DME;Decreased knowledge of precautions;Flexibility;Scar mobility;ROM;IADL;Edema;UE functional use;Pain;Dexterity    Rehab Potential Good    Clinical Decision Making Limited treatment options, no task modification necessary    Comorbidities Affecting Occupational Performance: None    Modification or Assistance to Complete Evaluation  Min-Moderate modification of tasks or assist with assess necessary to complete eval    OT Frequency Biweekly    OT Duration 6 weeks    OT Treatment/Interventions Self-care/ADL training;Splinting;Therapeutic exercise;Patient/family education;DME and/or AE instruction;Contrast Bath;Scar mobilization;Cryotherapy;Paraffin;Manual Therapy;Passive range of motion    Consulted and Agree with Plan of Care Patient             Patient will benefit from skilled therapeutic intervention in order to improve the following deficits and impairments:   Body Structure / Function / Physical Skills: ADL, Strength, Decreased knowledge of use of DME, Decreased knowledge of precautions, Flexibility, Scar mobility, ROM, IADL, Edema, UE functional use, Pain, Dexterity       Visit Diagnosis: Pain in right elbow  Stiffness of right wrist, not elsewhere classified  Stiffness of right hand, not elsewhere classified  Pain in both  hands  Scar condition and fibrosis of skin    Problem List Patient Active Problem List   Diagnosis Date Noted   Normocytic anemia 08/11/2021   Hypokalemia 08/09/2021   Displaced comminuted supracondylar fracture of right humerus without intercondylar fracture 08/08/2021   Open fracture of right humerus 08/08/2021   History of DVT (deep vein thrombosis) 06/06/2021   Rheumatoid arthritis of multiple sites without rheumatoid factor ([place]) 04/24/2021   History of pulmonary embolism 02/04/2020   History of blood clotting disorder 01/12/2020   Immunosuppression ([place]) 01/25/2019   Status post reverse total shoulder replacement, right 09/24/2018   Hyperglycemia 08/31/2018   S/P right colectomy 01/26/2018   Adenomatous polyp of ascending colon 11/24/2017   Generalized anxiety disorder 09/06/2017   Irritable bowel syndrome (IBS) 09/06/2017   Orthostasis 06/05/2016   Left shoulder pain 10/13/2015   Constipation 05/30/2015   Osteoporosis, postmenopausal 05/30/2015   Compression fracture of L2 lumbar vertebra with delayed healing 05/29/2015   Frequent falls 03/26/2015   Atherosclerosis of arteries 03/26/2015   Family history of colon cancer 09/28/2014   TMJ (temporomandibular joint syndrome) 06/12/2014   Varicose veins of both lower extremities without ulcer  or inflammation 06/12/2014   B12 deficiency 09/20/2013   Fatigue 05/23/2013   Internal hemorrhoids without complication 78/24/2353   Insomnia 03/03/2013   Vertigo due to cerebrovascular disease 03/03/2013   Seronegative rheumatoid arthritis affecting lower leg (HCC) 03/03/2013    Rosalyn Gess, OTR/L,CLT 12/24/2021, 9:46 AM  [facility] [address] [place], [address] Phone: [phone]   Fax:  [phone]  Name: QUERIDA BERETTA MRN: [id number] Date of Birth: 13-Apr-1932

## 2022-01-15 ENCOUNTER — Ambulatory Visit: Payer: Medicare Other | Attending: Orthopedic Surgery | Admitting: Occupational Therapy

## 2022-01-15 DIAGNOSIS — M25631 Stiffness of right wrist, not elsewhere classified: Secondary | ICD-10-CM | POA: Insufficient documentation

## 2022-01-15 DIAGNOSIS — M79642 Pain in left hand: Secondary | ICD-10-CM | POA: Diagnosis present

## 2022-01-15 DIAGNOSIS — M79641 Pain in right hand: Secondary | ICD-10-CM | POA: Diagnosis present

## 2022-01-15 DIAGNOSIS — M25641 Stiffness of right hand, not elsewhere classified: Secondary | ICD-10-CM | POA: Diagnosis present

## 2022-01-15 DIAGNOSIS — M6281 Muscle weakness (generalized): Secondary | ICD-10-CM | POA: Diagnosis present

## 2022-01-15 DIAGNOSIS — M25521 Pain in right elbow: Secondary | ICD-10-CM | POA: Insufficient documentation

## 2022-01-15 DIAGNOSIS — M25531 Pain in right wrist: Secondary | ICD-10-CM | POA: Insufficient documentation

## 2022-01-15 NOTE — Therapy (Signed)
[facility] [address], [place], [address] Phone: [phone]   Fax:  [phone]  Occupational Therapy Treatment  Patient Details  Name: Teresa Lin MRN: [id number] Date of Birth: 10-23-1931 Referring Provider (OT): Dr Marcelino Scot   Encounter Date: 01/15/2022   OT End of Session - 01/15/22 [place]     Visit Number 23    Number of Visits 24    Date for OT Re-Evaluation 02/04/22    OT Start Time 0816    OT Stop Time 0900    OT Time Calculation (min) 44 min    Activity Tolerance Patient tolerated treatment well    Behavior During Therapy [facility] for tasks assessed/performed             Past Medical History:  Diagnosis Date   Anemia    Anxiety    Arthritis    osteoarthritis. Bilateral knee replacement, hands, meniscal tear, cervical disc disease   Atherosclerosis    Atypical chest pain    Collagen vascular disease (HCC)    Colon polyp 12/15/2017   Colon polyps    Compression fracture of L2 (HCC)    Diverticulosis    DVT (deep venous thrombosis) (HCC)    Fracture of one rib, right side, subsequent encounter for fracture with routine healing 03/08/2017   GERD (gastroesophageal reflux disease)    History of seronegative inflammatory arthritis    HOH (hard of hearing)    no hearing aids   Hypertension    IBS (irritable bowel syndrome)    Internal hemorrhoids    Phlebitis and thrombophlebitis of deep veins of lower extremities ([place])    after surgery   Vision abnormalities     Past Surgical History:  Procedure Laterality Date   APPENDECTOMY     COLONOSCOPY N/A 01/24/2020   Procedure: COLONOSCOPY;  Surgeon: Lesly Rubenstein, MD;  Location: [facility] ENDOSCOPY;  Service: Endoscopy;  Laterality: N/A;   COLONOSCOPY WITH PROPOFOL N/A 10/24/2017   Procedure: COLONOSCOPY WITH PROPOFOL;  Surgeon: Manya Silvas, MD;  Location: [facility] ENDOSCOPY;  Service: Endoscopy;  Laterality: N/A;   COLONOSCOPY WITH PROPOFOL  N/A 11/21/2017   Procedure: COLONOSCOPY WITH PROPOFOL;  Surgeon: Manya Silvas, MD;  Location: [facility] ENDOSCOPY;  Service: Endoscopy;  Laterality: N/A;   DILATION AND CURETTAGE OF UTERUS     ESOPHAGOGASTRODUODENOSCOPY (EGD) WITH PROPOFOL N/A 02/25/2018   Procedure: ESOPHAGOGASTRODUODENOSCOPY (EGD) WITH PROPOFOL;  Surgeon: Manya Silvas, MD;  Location: [facility] ENDOSCOPY;  Service: Endoscopy;  Laterality: N/A;   EYE SURGERY     bilateral catarACT   JOINT REPLACEMENT Bilateral    knee replacement   LAPAROSCOPIC RIGHT COLECTOMY Right 12/15/2017   Procedure: LAPAROSCOPIC RIGHT COLECTOMY;  Surgeon: Robert Bellow, MD;  Location: [facility] ORS;  Service: General;  Laterality: Right;   ORIF HUMERUS FRACTURE Right 08/09/2021   Procedure: IRRIGATION AND DEBRIDEMENT; OPEN REDUCTION INTERNAL FIXATION (ORIF) DISTAL HUMERUS FRACTURE;  Surgeon: Altamese Franklin, MD;  Location: [facility];  Service: Orthopedics;  Laterality: Right;   REPLACEMENT TOTAL KNEE BILATERAL     TOTAL SHOULDER ARTHROPLASTY Right 09/24/2018   Procedure: TOTAL SHOULDER ARTHROPLASTY - REVERSE RIGHT;  Surgeon: Corky Mull, MD;  Location: [facility] ORS;  Service: Orthopedics;  Laterality: Right;    There were no vitals filed for this visit.   Subjective Assessment - 01/15/22 1828     Subjective  I done my exercises since have seen you.  But I did not get a good [employer]  yet and I did not get to the tai chi yet    Pertinent History Pt is an 88 y.o. female admitted 08/08/21 after fall sustaining open R supracondylar distal humerus fx. S/p R humeral ORIF, ulnar nerve neuroplasty 3/30 by Dr Marcelino Scot. PMH includes HTN, IBS, DVT/PE, arthritis, anxiety, HOH, multiple joint replacements. Pt was in hospital recieving OT and PT followed by [facility]. Pt refer to OT with increase pain and swelling in R hand and wrist after fall. Had to stop her arthritis medication while when she had to had surgery.    Patient Stated Goals I am right-handed so I really need my elbow, wrist and  hand.  I want to get my motion and strength better in my right arm and hand, wrist so that I can do the things around the house that I need to do.    Currently in Pain? No/denies                [facility] OT Assessment - 01/15/22 0001       AROM   Right Elbow Flexion 140    Right Elbow Extension -10    Right Forearm Pronation 90 Degrees    Right Forearm Supination 90 Degrees    Right Wrist Flexion 52 Degrees    Right Wrist Radial Deviation 5 Degrees    Right Wrist Ulnar Deviation 30 Degrees      Strength   Right Hand Grip (lbs) 31    Right Hand Lateral Pinch 11 lbs    Right Hand 3 Point Pinch 9 lbs    Left Hand Grip (lbs) 35    Left Hand Lateral Pinch 8 lbs    Left Hand 3 Point Pinch 7 lbs                 Patient made great progress from start of care.  Patient returned this date after being seen 3 to 4 weeks ago in encouraged to return to community exercise programs. Patient report she did not do that yet but did do her home exercises. Patient's grip and prehension strength improved since last time.  See flowsheet Wrist active range of motion and strength back to prior level of function. Elbow flexion 138 continue to improve but patient to focus on passive range of motion in the shower in the morning to work on endrange to be able to wash face with flat hand putting earrings in or hearing aids with more ease. Elbow extension -5. Patient strength and elbow flexion 5 -/5. Reviewed with patient to do chair push-ups for elbow extension strengthening but put a pillow in seat use arms 50-50 pushing through the palm.  Patient was able to do 10 reps pain-free.  Also encourage patient again to contact [facility] about tai chi class to decrease fall risk and increase balance. Next class start 12th September -recommended patient to do that. Patient done water exercises classes before. [facility] to contact the [facility] and the [employer] about water aerobic exercises classes to work  on right upper extremity strengthening as well as overall conditioning for arthritis in multiple joints.  Or working with a trainer a few sessions for strengthening  patient in agreement patient to transfer to community exercises and follow-up with me in 6-8 weeks.               OT Education - 01/15/22 1829     Education Details home exercises/community exercise programs and use of exercise log    Person(s) Educated  Patient    Methods Explanation;Demonstration;Tactile cues;Verbal cues;Handout    Comprehension Verbal cues required;Returned demonstration;Verbalized understanding              OT Short Term Goals - 10/30/21 1044       OT SHORT TERM GOAL #1   Title Pt to be independent in HEP to increase AROM in right elbow, wrist and hand  with less pain    Baseline Elbow extension -30, flexion 130, supination 75 with all pain increased endrange.  Wrist extension decreased as well as flexion compared to September last year  NOW ext -22 to -5 and flexion 140-pain still with PROM end range and weight    Status Achieved               OT Long Term Goals - 12/24/21 0944       OT LONG TERM GOAL #1   Title Right elbow flexion increased for patient to be able to put her hearing aids in and fix her hair with right dominant hand with pain less than 3/10    Baseline Patient reports she can eat as well as put her hearing aids in now but still hard to fix her hair in the back as well as pushing the door open or raking.  Brushing teeth is also a little hard-pain increased somewhat the last 2 weeks because of changes in her infusion drug for arthritis - Pt cannot wash face flat hand, earrings - 138 flexion at elvow    Time 6    Period Weeks    Status On-going    Target Date 02/04/22      OT LONG TERM GOAL #2   Title Right elbow extension increased with more than 15 degrees for patient to be able to reach in pocket and shelves without pain.    Status Achieved      OT LONG TERM  GOAL #3   Title Right elbow flexion extension increased for patient to be able to push heavy door open, turn a doorknob and carrying more than 5 pounds without increase symptoms    Baseline Elbow extension -5, flexion 138, patient able to carry 10 pounds left and hand OT 7 pounds.  No pain.  Just heavy.  Can pull door but cannot push heavy door open    Time 6    Period Weeks    Status On-going    Target Date 02/04/22      OT LONG TERM GOAL #4   Title Patient's pain, swelling and active range of motion increased to same than last year.    Status Achieved                   Plan - 01/15/22 1829     Clinical Impression Statement Patient presented OT evaluation with a diagnosis of her right elbow supracondylar fracture with ORIF on 08/09/2021 after a fall.   Since evaluation patient showed  decrease edema and decreased pain in wrist and digits with increased motion.  She started back since surgery her arthritis medication that helped her rheumatoid arthritis symptoms and pain. Pt cont to show increased motion as well as strength in right upper extremity with increased functional use. Continue to encourage patient to use her [facility] neoprene splint with strengthening HEP.  Patient's grip and prehension  strength cont to increase.  Patient to continue to work on passive range of motion for elbow flexion endrange in the shower to be able to wash her face with  flat hand.  Encourage for pt to cont with that. as well as push up from chair - with pillow in seat . Patient was able to carry 10 pounds but heavy.  With no pain.  Recommend for patient at this stage to get in with some water exercise classes at the [facility] or with trainer for light strengthening.   Also recommended again for patient do tai chi class for arthritis as [place] to increase balance and decrease fall risk. Next class starts 12 Sept.  Patient will follow-up with me in 6-8 weeks.  Patient to initiate community exercise in the  meantime.  Patient's elbow strength increased in endrange elbow flexion 5-/5.  Forearm and wrist strength back to prior to fall.   Patient continues to  benefit from OT services to increase use of right dominant hand  and UE and assist pt transfering to comminity exercise classes to increase independence in daily activities.    OT Occupational Profile and History Problem Focused Assessment - Including review of records relating to presenting problem    Occupational performance deficits (Please refer to evaluation for details): ADL's;IADL's;Play;Leisure;Social Participation    Body Structure / Function / Physical Skills ADL;Strength;Decreased knowledge of use of DME;Decreased knowledge of precautions;Flexibility;Scar mobility;ROM;IADL;Edema;UE functional use;Pain;Dexterity    Rehab Potential Good    Clinical Decision Making Limited treatment options, no task modification necessary    Comorbidities Affecting Occupational Performance: None    Modification or Assistance to Complete Evaluation  Min-Moderate modification of tasks or assist with assess necessary to complete eval    OT Frequency --   6-8 wks   OT Duration 6 weeks    OT Treatment/Interventions Self-care/ADL training;Splinting;Therapeutic exercise;Patient/family education;DME and/or AE instruction;Contrast Bath;Scar mobilization;Cryotherapy;Paraffin;Manual Therapy;Passive range of motion    Consulted and Agree with Plan of Care Patient             Patient will benefit from skilled therapeutic intervention in order to improve the following deficits and impairments:   Body Structure / Function / Physical Skills: ADL, Strength, Decreased knowledge of use of DME, Decreased knowledge of precautions, Flexibility, Scar mobility, ROM, IADL, Edema, UE functional use, Pain, Dexterity       Visit Diagnosis: Pain in right elbow  Stiffness of right wrist, not elsewhere classified  Stiffness of right hand, not elsewhere classified  Pain in  both hands  Muscle weakness (generalized)  Pain in right wrist    Problem List Patient Active Problem List   Diagnosis Date Noted   Normocytic anemia 08/11/2021   Hypokalemia 08/09/2021   Displaced comminuted supracondylar fracture of right humerus without intercondylar fracture 08/08/2021   Open fracture of right humerus 08/08/2021   History of DVT (deep vein thrombosis) 06/06/2021   Rheumatoid arthritis of multiple sites without rheumatoid factor ([place]) 04/24/2021   History of pulmonary embolism 02/04/2020   History of blood clotting disorder 01/12/2020   Immunosuppression ([place]) 01/25/2019   Status post reverse total shoulder replacement, right 09/24/2018   Hyperglycemia 08/31/2018   S/P right colectomy 01/26/2018   Adenomatous polyp of ascending colon 11/24/2017   Generalized anxiety disorder 09/06/2017   Irritable bowel syndrome (IBS) 09/06/2017   Orthostasis 06/05/2016   Left shoulder pain 10/13/2015   Constipation 05/30/2015   Osteoporosis, postmenopausal 05/30/2015   Compression fracture of L2 lumbar vertebra with delayed healing 05/29/2015   Frequent falls 03/26/2015   Atherosclerosis of arteries 03/26/2015   Family history of colon cancer 09/28/2014   TMJ (temporomandibular joint syndrome) 06/12/2014  Varicose veins of both lower extremities without ulcer or inflammation 06/12/2014   B12 deficiency 09/20/2013   Fatigue 05/23/2013   Internal hemorrhoids without complication 69/62/9528   Insomnia 03/03/2013   Vertigo due to cerebrovascular disease 03/03/2013   Seronegative rheumatoid arthritis affecting lower leg ([place]) 03/03/2013    Rosalyn Gess, OTR/L,CLT 01/15/2022, 6:33 PM  [facility] [address], [place], [address] Phone: [phone]   Fax:  [phone]  Name: CANDID BOVEY MRN: [id number] Date of Birth: [birth year]

## 2022-03-06 ENCOUNTER — Other Ambulatory Visit: Payer: Self-pay | Admitting: Student

## 2022-03-06 DIAGNOSIS — R413 Other amnesia: Secondary | ICD-10-CM

## 2022-03-06 DIAGNOSIS — Z9181 History of falling: Secondary | ICD-10-CM

## 2022-03-06 DIAGNOSIS — M542 Cervicalgia: Secondary | ICD-10-CM

## 2022-03-06 DIAGNOSIS — G3184 Mild cognitive impairment, so stated: Secondary | ICD-10-CM

## 2022-03-06 DIAGNOSIS — Z79899 Other long term (current) drug therapy: Secondary | ICD-10-CM

## 2022-03-19 ENCOUNTER — Ambulatory Visit: Payer: Medicare Other | Attending: Orthopedic Surgery | Admitting: Occupational Therapy

## 2022-03-19 DIAGNOSIS — M25641 Stiffness of right hand, not elsewhere classified: Secondary | ICD-10-CM

## 2022-03-19 DIAGNOSIS — M79641 Pain in right hand: Secondary | ICD-10-CM

## 2022-03-19 DIAGNOSIS — M25521 Pain in right elbow: Secondary | ICD-10-CM | POA: Diagnosis not present

## 2022-03-19 DIAGNOSIS — M25631 Stiffness of right wrist, not elsewhere classified: Secondary | ICD-10-CM | POA: Diagnosis present

## 2022-03-19 DIAGNOSIS — M6281 Muscle weakness (generalized): Secondary | ICD-10-CM

## 2022-03-19 DIAGNOSIS — M79642 Pain in left hand: Secondary | ICD-10-CM | POA: Diagnosis present

## 2022-03-19 NOTE — Therapy (Signed)
[facility] [address] [place], [address] Phone: [phone]   Fax:  [phone]  Occupational Therapy Treatment  Patient Details  Name: Teresa Lin MRN: [id number] Date of Birth: 09-15-1931 Referring Provider (OT): Dr Marcelino Scot   Encounter Date: 03/19/2022   OT End of Session - 03/19/22 0901     Visit Number 24    Number of Visits 24    Date for OT Re-Evaluation 03/19/22    OT Start Time 0820    OT Stop Time 0858    OT Time Calculation (min) 38 min    Activity Tolerance Patient tolerated treatment well    Behavior During Therapy [facility] for tasks assessed/performed             Past Medical History:  Diagnosis Date   Anemia    Anxiety    Arthritis    osteoarthritis. Bilateral knee replacement, hands, meniscal tear, cervical disc disease   Atherosclerosis    Atypical chest pain    Collagen vascular disease (HCC)    Colon polyp 12/15/2017   Colon polyps    Compression fracture of L2 (HCC)    Diverticulosis    DVT (deep venous thrombosis) (HCC)    Fracture of one rib, right side, subsequent encounter for fracture with routine healing 03/08/2017   GERD (gastroesophageal reflux disease)    History of seronegative inflammatory arthritis    HOH (hard of hearing)    no hearing aids   Hypertension    IBS (irritable bowel syndrome)    Internal hemorrhoids    Phlebitis and thrombophlebitis of deep veins of lower extremities ([place])    after surgery   Vision abnormalities     Past Surgical History:  Procedure Laterality Date   APPENDECTOMY     COLONOSCOPY N/A 01/24/2020   Procedure: COLONOSCOPY;  Surgeon: Lesly Rubenstein, MD;  Location: [facility] ENDOSCOPY;  Service: Endoscopy;  Laterality: N/A;   COLONOSCOPY WITH PROPOFOL N/A 10/24/2017   Procedure: COLONOSCOPY WITH PROPOFOL;  Surgeon: Manya Silvas, MD;  Location: [facility] ENDOSCOPY;  Service: Endoscopy;  Laterality: N/A;   COLONOSCOPY WITH PROPOFOL  N/A 11/21/2017   Procedure: COLONOSCOPY WITH PROPOFOL;  Surgeon: Manya Silvas, MD;  Location: [facility] ENDOSCOPY;  Service: Endoscopy;  Laterality: N/A;   DILATION AND CURETTAGE OF UTERUS     ESOPHAGOGASTRODUODENOSCOPY (EGD) WITH PROPOFOL N/A 02/25/2018   Procedure: ESOPHAGOGASTRODUODENOSCOPY (EGD) WITH PROPOFOL;  Surgeon: Manya Silvas, MD;  Location: [facility] ENDOSCOPY;  Service: Endoscopy;  Laterality: N/A;   EYE SURGERY     bilateral catarACT   JOINT REPLACEMENT Bilateral    knee replacement   LAPAROSCOPIC RIGHT COLECTOMY Right 12/15/2017   Procedure: LAPAROSCOPIC RIGHT COLECTOMY;  Surgeon: Robert Bellow, MD;  Location: [facility] ORS;  Service: General;  Laterality: Right;   ORIF HUMERUS FRACTURE Right 08/09/2021   Procedure: IRRIGATION AND DEBRIDEMENT; OPEN REDUCTION INTERNAL FIXATION (ORIF) DISTAL HUMERUS FRACTURE;  Surgeon: Altamese Havelock, MD;  Location: [place];  Service: Orthopedics;  Laterality: Right;   REPLACEMENT TOTAL KNEE BILATERAL     TOTAL SHOULDER ARTHROPLASTY Right 09/24/2018   Procedure: TOTAL SHOULDER ARTHROPLASTY - REVERSE RIGHT;  Surgeon: Corky Mull, MD;  Location: [facility] ORS;  Service: Orthopedics;  Laterality: Right;    There were no vitals filed for this visit.   Subjective Assessment - 03/19/22 0825     Subjective  I did not get to the tai chi but only did 2 classes.  And then had  appointments on Tuesdays.  Do know when the next one is.  And then I want to try and get to the pool.  Did run into one of the ladies that looked so good.  I did fall about 10 days ago out in the yard moving some furniture.  When the arm was doing good    Pertinent History Pt is an 88 y.o. female admitted 08/08/21 after fall sustaining open R supracondylar distal humerus fx. S/p R humeral ORIF, ulnar nerve neuroplasty 3/30 by Dr Marcelino Scot. PMH includes HTN, IBS, DVT/PE, arthritis, anxiety, HOH, multiple joint replacements. Pt was in hospital recieving OT and PT followed by [facility]. Pt refer to OT with  increase pain and swelling in R hand and wrist after fall. Had to stop her arthritis medication while when she had to had surgery.    Patient Stated Goals I am right-handed so I really need my elbow, wrist and hand.  I want to get my motion and strength better in my right arm and hand, wrist so that I can do the things around the house that I need to do.    Currently in Pain? No/denies                [facility] OT Assessment - 03/19/22 0001       AROM   Right Elbow Flexion 145    Right Elbow Extension -10    Right Forearm Pronation 90 Degrees    Right Forearm Supination 90 Degrees    Right Wrist Extension 30 Degrees    Right Wrist Flexion 60 Degrees    Right Wrist Radial Deviation 5 Degrees    Right Wrist Ulnar Deviation 30 Degrees      Strength   Right Hand Grip (lbs) 25    Right Hand Lateral Pinch 11 lbs    Right Hand 3 Point Pinch 9 lbs    Left Hand Grip (lbs) 34    Left Hand Lateral Pinch 9 lbs    Left Hand 3 Point Pinch 7 lbs      Right Hand AROM   R Index  MCP 0-90 80 Degrees    R Index PIP 0-100 85 Degrees    R Long  MCP 0-90 90 Degrees    R Long PIP 0-100 60 Degrees    R Ring  MCP 0-90 85 Degrees    R Ring PIP 0-100 100 Degrees    R Little  MCP 0-90 90 Degrees    R Little PIP 0-100 95 Degrees               Patient arrive for a 35-monthcheckup to assess if patient was able to maintain her range of motion and strength in right upper extremity.  Patient continues to get her infusion for arthritis. Range of motion at the elbow and wrist patient was able to maintain Digits flexion improved somewhat from last time taken. Strength at elbow is 5 -/5 for extension and 5/5 for flexion Wrist in all planes 5 out of 5 in range of motion.  Supination pronation little bit limited but more because of CMC arthritis and discomfort. Patient was able to maintain left grip strength as well as bilateral prehension strength.  The decrease in grip on the right. Reinforced with  patient again to use her CMC neoprene wrap with activities to decrease pain and discomfort at the right thumb CMC. Did a Berg balance screen on patient scored 56 out of 56 with no loss of balance  during screening putting her at low risk for fall. But patient continues to fall reporting another one 10 days ago out in the yard when she was moving furniture. Reinforced with patient that she needs to slow down. She does everything fast. Discussed with her safety around the house with loose carpets, when standing or sitting up to stand or sit for a moment in case dizziness. Look for feet to get tangled in the sheets or blankets in the side of the bed or furniture. Patient also looking into getting to the [facility] to do some exercises in the pool.  As well as a tai chi class over [facility]. At this stage patient can be discharged from OT services for right upper extremity elbow wrist and hand.  Patient able to maintain progress.  Patient in agreement.              OT Education - 03/19/22 0901     Education Details Progress as well as discharge planning    Person(s) Educated Patient    Methods Explanation;Demonstration;Tactile cues;Verbal cues;Handout    Comprehension Verbal cues required;Returned demonstration;Verbalized understanding              OT Short Term Goals - 10/30/21 1044       OT SHORT TERM GOAL #1   Title Pt to be independent in HEP to increase AROM in right elbow, wrist and hand  with less pain    Baseline Elbow extension -30, flexion 130, supination 75 with all pain increased endrange.  Wrist extension decreased as well as flexion compared to September last year  NOW ext -22 to -5 and flexion 140-pain still with PROM end range and weight    Status Achieved               OT Long Term Goals - 03/19/22 0906       OT LONG TERM GOAL #1   Title Right elbow flexion increased for patient to be able to put her hearing aids in and fix her hair with right  dominant hand with pain less than 3/10    Status Achieved      OT LONG TERM GOAL #2   Title Right elbow extension increased with more than 15 degrees for patient to be able to reach in pocket and shelves without pain.    Status Achieved      OT LONG TERM GOAL #3   Title Right elbow flexion extension increased for patient to be able to push heavy door open, turn a doorknob and carrying more than 5 pounds without increase symptoms    Status Achieved      OT LONG TERM GOAL #4   Baseline Right wrist extension and flexion decreased compared to September of last year as well as grip and prehension strength patient only 6-1/2 weeks postop.  Patient with pain with touch and tenderness over radial wrist and hand.    Status Achieved                   Plan - 03/19/22 0902     Clinical Impression Statement Patient presented OT evaluation with a diagnosis of her right elbow supracondylar fracture with ORIF on 08/09/2021 after a fall.  Patient was seen for therapy after the fall.  My great progress in pain in right hand and wrist while increase sitting range of motion and strength.  Patient is getting infusions for her arthritis and doing very well with maintaining her range of motion  and wrist and digits strength in the wrist.  Grip and prehension strength is doing well except she did decrease in right grip strength from 31 to 25 pounds. Continue to encourage patient to use her CMC neoprene splint to decrease pain in right thumb while functional strengthening the right hand and wrist.  Patient can push and pull heavy door as well as carry 10 pounds in the right arm able to jump from the chair and perform ADLs independently.  Did recommend for patient in the past to get in with some water exercise classes at the [facility] or with trainer for light strengthening.   Also recommended again for patient do tai chi class for arthritis as [place] to increase balance and decrease fall risk.  Next  class starts around middle November that will be a 6 weeks class.  Patient able to maintain progress in right upper extremity would recommend at this stage for her to be discharge.  Patient in agreement.    OT Occupational Profile and History Problem Focused Assessment - Including review of records relating to presenting problem    Occupational performance deficits (Please refer to evaluation for details): ADL's;IADL's;Play;Leisure;Social Participation    Body Structure / Function / Physical Skills ADL;Strength;Decreased knowledge of use of DME;Decreased knowledge of precautions;Flexibility;Scar mobility;ROM;IADL;Edema;UE functional use;Pain;Dexterity    Rehab Potential Good    Clinical Decision Making Limited treatment options, no task modification necessary    Comorbidities Affecting Occupational Performance: None    Modification or Assistance to Complete Evaluation  Min-Moderate modification of tasks or assist with assess necessary to complete eval    OT Frequency 1x / week    OT Duration --   1 wk   OT Treatment/Interventions Self-care/ADL training;Splinting;Therapeutic exercise;Patient/family education;DME and/or AE instruction;Contrast Bath;Scar mobilization;Cryotherapy;Paraffin;Manual Therapy;Passive range of motion    Consulted and Agree with Plan of Care Patient             Patient will benefit from skilled therapeutic intervention in order to improve the following deficits and impairments:   Body Structure / Function / Physical Skills: ADL, Strength, Decreased knowledge of use of DME, Decreased knowledge of precautions, Flexibility, Scar mobility, ROM, IADL, Edema, UE functional use, Pain, Dexterity       Visit Diagnosis: Pain in right elbow  Stiffness of right wrist, not elsewhere classified  Stiffness of right hand, not elsewhere classified  Pain in both hands  Muscle weakness (generalized)    Problem List Patient Active Problem List   Diagnosis Date Noted    Normocytic anemia 08/11/2021   Hypokalemia 08/09/2021   Displaced comminuted supracondylar fracture of right humerus without intercondylar fracture 08/08/2021   Open fracture of right humerus 08/08/2021   History of DVT (deep vein thrombosis) 06/06/2021   Rheumatoid arthritis of multiple sites without rheumatoid factor ([place]) 04/24/2021   History of pulmonary embolism 02/04/2020   History of blood clotting disorder 01/12/2020   Immunosuppression ([place]) 01/25/2019   Status post reverse total shoulder replacement, right 09/24/2018   Hyperglycemia 08/31/2018   S/P right colectomy 01/26/2018   Adenomatous polyp of ascending colon 11/24/2017   Generalized anxiety disorder 09/06/2017   Irritable bowel syndrome (IBS) 09/06/2017   Orthostasis 06/05/2016   Left shoulder pain 10/13/2015   Constipation 05/30/2015   Osteoporosis, postmenopausal 05/30/2015   Compression fracture of L2 lumbar vertebra with delayed healing 05/29/2015   Frequent falls 03/26/2015   Atherosclerosis of arteries 03/26/2015   Family history of colon cancer 09/28/2014   TMJ (temporomandibular joint syndrome)  06/12/2014   Varicose veins of both lower extremities without ulcer or inflammation 06/12/2014   B12 deficiency 09/20/2013   Fatigue 05/23/2013   Internal hemorrhoids without complication 49/17/9150   Insomnia 03/03/2013   Vertigo due to cerebrovascular disease 03/03/2013   Seronegative rheumatoid arthritis affecting lower leg ([place]) 03/03/2013    Rosalyn Gess, OTR/L,CLT 03/19/2022, 9:07 AM  [facility] [address], [place], [address] Phone: [phone]   Fax:  [phone]  Name: SEIRRA KOS MRN: [id number] Date of Birth: 1931/07/18

## 2022-03-20 ENCOUNTER — Ambulatory Visit
Admission: RE | Admit: 2022-03-20 | Discharge: 2022-03-20 | Disposition: A | Discharge status: Home / Self Care | Payer: Medicare Other | Source: Ambulatory Visit | Attending: Student | Admitting: Student

## 2022-03-20 DIAGNOSIS — G3184 Mild cognitive impairment, so stated: Secondary | ICD-10-CM | POA: Insufficient documentation

## 2022-03-20 DIAGNOSIS — R413 Other amnesia: Secondary | ICD-10-CM | POA: Insufficient documentation

## 2022-03-20 DIAGNOSIS — M542 Cervicalgia: Secondary | ICD-10-CM | POA: Diagnosis present

## 2022-03-20 DIAGNOSIS — Z79899 Other long term (current) drug therapy: Secondary | ICD-10-CM | POA: Diagnosis present

## 2022-03-20 DIAGNOSIS — Z9181 History of falling: Secondary | ICD-10-CM | POA: Diagnosis present

## 2022-05-20 DIAGNOSIS — M0609 Rheumatoid arthritis without rheumatoid factor, multiple sites: Secondary | ICD-10-CM | POA: Diagnosis not present

## 2022-05-29 DIAGNOSIS — G3184 Mild cognitive impairment, so stated: Secondary | ICD-10-CM | POA: Diagnosis not present

## 2022-05-29 DIAGNOSIS — M542 Cervicalgia: Secondary | ICD-10-CM | POA: Diagnosis not present

## 2022-05-29 DIAGNOSIS — G939 Disorder of brain, unspecified: Secondary | ICD-10-CM | POA: Diagnosis not present

## 2022-05-29 DIAGNOSIS — Z85038 Personal history of other malignant neoplasm of large intestine: Secondary | ICD-10-CM | POA: Diagnosis not present

## 2022-05-29 DIAGNOSIS — Z9181 History of falling: Secondary | ICD-10-CM | POA: Diagnosis not present

## 2022-05-29 DIAGNOSIS — R195 Other fecal abnormalities: Secondary | ICD-10-CM | POA: Diagnosis not present

## 2022-06-17 DIAGNOSIS — M0609 Rheumatoid arthritis without rheumatoid factor, multiple sites: Secondary | ICD-10-CM | POA: Diagnosis not present

## 2022-07-15 DIAGNOSIS — M0609 Rheumatoid arthritis without rheumatoid factor, multiple sites: Secondary | ICD-10-CM | POA: Diagnosis not present

## 2022-07-15 DIAGNOSIS — Z79899 Other long term (current) drug therapy: Secondary | ICD-10-CM | POA: Diagnosis not present

## 2022-07-15 DIAGNOSIS — D849 Immunodeficiency, unspecified: Secondary | ICD-10-CM | POA: Diagnosis not present

## 2022-07-16 DIAGNOSIS — H903 Sensorineural hearing loss, bilateral: Secondary | ICD-10-CM | POA: Diagnosis not present

## 2022-07-16 DIAGNOSIS — H6123 Impacted cerumen, bilateral: Secondary | ICD-10-CM | POA: Diagnosis not present

## 2022-07-17 DIAGNOSIS — E7800 Pure hypercholesterolemia, unspecified: Secondary | ICD-10-CM | POA: Diagnosis not present

## 2022-07-17 DIAGNOSIS — R600 Localized edema: Secondary | ICD-10-CM | POA: Diagnosis not present

## 2022-07-17 DIAGNOSIS — I700 Atherosclerosis of aorta: Secondary | ICD-10-CM | POA: Diagnosis not present

## 2022-07-17 DIAGNOSIS — I10 Essential (primary) hypertension: Secondary | ICD-10-CM | POA: Diagnosis not present

## 2022-07-17 DIAGNOSIS — Z86711 Personal history of pulmonary embolism: Secondary | ICD-10-CM | POA: Diagnosis not present

## 2022-07-17 DIAGNOSIS — Z86718 Personal history of other venous thrombosis and embolism: Secondary | ICD-10-CM | POA: Diagnosis not present

## 2022-08-12 DIAGNOSIS — M0609 Rheumatoid arthritis without rheumatoid factor, multiple sites: Secondary | ICD-10-CM | POA: Diagnosis not present

## 2022-09-10 DIAGNOSIS — M0609 Rheumatoid arthritis without rheumatoid factor, multiple sites: Secondary | ICD-10-CM | POA: Diagnosis not present

## 2022-09-25 ENCOUNTER — Other Ambulatory Visit: Payer: Self-pay | Admitting: Internal Medicine

## 2022-09-25 DIAGNOSIS — Z1231 Encounter for screening mammogram for malignant neoplasm of breast: Secondary | ICD-10-CM

## 2022-09-27 DIAGNOSIS — G3184 Mild cognitive impairment, so stated: Secondary | ICD-10-CM | POA: Diagnosis not present

## 2022-09-27 DIAGNOSIS — Z9181 History of falling: Secondary | ICD-10-CM | POA: Diagnosis not present

## 2022-10-10 DIAGNOSIS — R059 Cough, unspecified: Secondary | ICD-10-CM | POA: Diagnosis not present

## 2022-10-10 DIAGNOSIS — D849 Immunodeficiency, unspecified: Secondary | ICD-10-CM | POA: Diagnosis not present

## 2022-10-10 DIAGNOSIS — M0609 Rheumatoid arthritis without rheumatoid factor, multiple sites: Secondary | ICD-10-CM | POA: Diagnosis not present

## 2022-10-10 DIAGNOSIS — R053 Chronic cough: Secondary | ICD-10-CM | POA: Diagnosis not present

## 2022-10-10 DIAGNOSIS — J069 Acute upper respiratory infection, unspecified: Secondary | ICD-10-CM | POA: Diagnosis not present

## 2022-10-10 DIAGNOSIS — I700 Atherosclerosis of aorta: Secondary | ICD-10-CM | POA: Diagnosis not present

## 2022-10-10 DIAGNOSIS — Z86718 Personal history of other venous thrombosis and embolism: Secondary | ICD-10-CM | POA: Diagnosis not present

## 2022-10-10 DIAGNOSIS — Z86711 Personal history of pulmonary embolism: Secondary | ICD-10-CM | POA: Diagnosis not present

## 2022-10-15 DIAGNOSIS — R7303 Prediabetes: Secondary | ICD-10-CM | POA: Diagnosis not present

## 2022-10-15 DIAGNOSIS — Z08 Encounter for follow-up examination after completed treatment for malignant neoplasm: Secondary | ICD-10-CM | POA: Diagnosis not present

## 2022-10-15 DIAGNOSIS — D225 Melanocytic nevi of trunk: Secondary | ICD-10-CM | POA: Diagnosis not present

## 2022-10-15 DIAGNOSIS — D485 Neoplasm of uncertain behavior of skin: Secondary | ICD-10-CM | POA: Diagnosis not present

## 2022-10-15 DIAGNOSIS — L570 Actinic keratosis: Secondary | ICD-10-CM | POA: Diagnosis not present

## 2022-10-15 DIAGNOSIS — D2271 Melanocytic nevi of right lower limb, including hip: Secondary | ICD-10-CM | POA: Diagnosis not present

## 2022-10-15 DIAGNOSIS — D2262 Melanocytic nevi of left upper limb, including shoulder: Secondary | ICD-10-CM | POA: Diagnosis not present

## 2022-10-15 DIAGNOSIS — M0609 Rheumatoid arthritis without rheumatoid factor, multiple sites: Secondary | ICD-10-CM | POA: Diagnosis not present

## 2022-10-15 DIAGNOSIS — D2261 Melanocytic nevi of right upper limb, including shoulder: Secondary | ICD-10-CM | POA: Diagnosis not present

## 2022-10-15 DIAGNOSIS — E7800 Pure hypercholesterolemia, unspecified: Secondary | ICD-10-CM | POA: Diagnosis not present

## 2022-10-15 DIAGNOSIS — Z85828 Personal history of other malignant neoplasm of skin: Secondary | ICD-10-CM | POA: Diagnosis not present

## 2022-10-15 DIAGNOSIS — R311 Benign essential microscopic hematuria: Secondary | ICD-10-CM | POA: Diagnosis not present

## 2022-10-15 DIAGNOSIS — I10 Essential (primary) hypertension: Secondary | ICD-10-CM | POA: Diagnosis not present

## 2022-10-15 DIAGNOSIS — C44712 Basal cell carcinoma of skin of right lower limb, including hip: Secondary | ICD-10-CM | POA: Diagnosis not present

## 2022-10-21 DIAGNOSIS — M0609 Rheumatoid arthritis without rheumatoid factor, multiple sites: Secondary | ICD-10-CM | POA: Diagnosis not present

## 2022-10-23 DIAGNOSIS — Z86711 Personal history of pulmonary embolism: Secondary | ICD-10-CM | POA: Diagnosis not present

## 2022-10-23 DIAGNOSIS — M0609 Rheumatoid arthritis without rheumatoid factor, multiple sites: Secondary | ICD-10-CM | POA: Diagnosis not present

## 2022-10-23 DIAGNOSIS — K2950 Unspecified chronic gastritis without bleeding: Secondary | ICD-10-CM | POA: Diagnosis not present

## 2022-10-23 DIAGNOSIS — D849 Immunodeficiency, unspecified: Secondary | ICD-10-CM | POA: Diagnosis not present

## 2022-10-23 DIAGNOSIS — R0609 Other forms of dyspnea: Secondary | ICD-10-CM | POA: Diagnosis not present

## 2022-10-23 DIAGNOSIS — I10 Essential (primary) hypertension: Secondary | ICD-10-CM | POA: Diagnosis not present

## 2022-10-23 DIAGNOSIS — E7800 Pure hypercholesterolemia, unspecified: Secondary | ICD-10-CM | POA: Diagnosis not present

## 2022-10-23 DIAGNOSIS — Z86718 Personal history of other venous thrombosis and embolism: Secondary | ICD-10-CM | POA: Diagnosis not present

## 2022-10-23 DIAGNOSIS — R7303 Prediabetes: Secondary | ICD-10-CM | POA: Diagnosis not present

## 2022-10-23 DIAGNOSIS — R311 Benign essential microscopic hematuria: Secondary | ICD-10-CM | POA: Diagnosis not present

## 2022-10-23 DIAGNOSIS — I700 Atherosclerosis of aorta: Secondary | ICD-10-CM | POA: Diagnosis not present

## 2022-10-23 DIAGNOSIS — R0600 Dyspnea, unspecified: Secondary | ICD-10-CM | POA: Diagnosis not present

## 2022-10-23 DIAGNOSIS — Z66 Do not resuscitate: Secondary | ICD-10-CM | POA: Diagnosis not present

## 2022-10-28 ENCOUNTER — Ambulatory Visit
Admission: RE | Admit: 2022-10-28 | Discharge: 2022-10-28 | Disposition: A | Discharge status: Home / Self Care | Payer: No Typology Code available for payment source | Source: Ambulatory Visit | Attending: Internal Medicine | Admitting: Internal Medicine

## 2022-10-28 DIAGNOSIS — Z1231 Encounter for screening mammogram for malignant neoplasm of breast: Secondary | ICD-10-CM | POA: Insufficient documentation

## 2022-10-28 DIAGNOSIS — C44712 Basal cell carcinoma of skin of right lower limb, including hip: Secondary | ICD-10-CM | POA: Diagnosis not present

## 2022-10-28 DIAGNOSIS — D2371 Other benign neoplasm of skin of right lower limb, including hip: Secondary | ICD-10-CM | POA: Diagnosis not present

## 2022-11-04 DIAGNOSIS — Z79899 Other long term (current) drug therapy: Secondary | ICD-10-CM | POA: Diagnosis not present

## 2022-11-04 DIAGNOSIS — M79671 Pain in right foot: Secondary | ICD-10-CM | POA: Diagnosis not present

## 2022-11-04 DIAGNOSIS — M79672 Pain in left foot: Secondary | ICD-10-CM | POA: Diagnosis not present

## 2022-11-04 DIAGNOSIS — M0609 Rheumatoid arthritis without rheumatoid factor, multiple sites: Secondary | ICD-10-CM | POA: Diagnosis not present

## 2022-11-19 DIAGNOSIS — M0609 Rheumatoid arthritis without rheumatoid factor, multiple sites: Secondary | ICD-10-CM | POA: Diagnosis not present

## 2022-11-19 DIAGNOSIS — Z85038 Personal history of other malignant neoplasm of large intestine: Secondary | ICD-10-CM | POA: Diagnosis not present

## 2022-11-19 DIAGNOSIS — R195 Other fecal abnormalities: Secondary | ICD-10-CM | POA: Diagnosis not present

## 2022-11-22 DIAGNOSIS — Z86711 Personal history of pulmonary embolism: Secondary | ICD-10-CM | POA: Diagnosis not present

## 2022-11-22 DIAGNOSIS — M0609 Rheumatoid arthritis without rheumatoid factor, multiple sites: Secondary | ICD-10-CM | POA: Diagnosis not present

## 2022-11-22 DIAGNOSIS — D849 Immunodeficiency, unspecified: Secondary | ICD-10-CM | POA: Diagnosis not present

## 2022-11-22 DIAGNOSIS — S81802A Unspecified open wound, left lower leg, initial encounter: Secondary | ICD-10-CM | POA: Diagnosis not present

## 2022-11-22 DIAGNOSIS — I700 Atherosclerosis of aorta: Secondary | ICD-10-CM | POA: Diagnosis not present

## 2022-12-11 DIAGNOSIS — R7303 Prediabetes: Secondary | ICD-10-CM | POA: Diagnosis not present

## 2022-12-11 DIAGNOSIS — J069 Acute upper respiratory infection, unspecified: Secondary | ICD-10-CM | POA: Diagnosis not present

## 2022-12-11 DIAGNOSIS — I10 Essential (primary) hypertension: Secondary | ICD-10-CM | POA: Diagnosis not present

## 2022-12-11 DIAGNOSIS — D849 Immunodeficiency, unspecified: Secondary | ICD-10-CM | POA: Diagnosis not present

## 2022-12-17 DIAGNOSIS — M0609 Rheumatoid arthritis without rheumatoid factor, multiple sites: Secondary | ICD-10-CM | POA: Diagnosis not present

## 2022-12-24 DIAGNOSIS — H8111 Benign paroxysmal vertigo, right ear: Secondary | ICD-10-CM | POA: Diagnosis not present

## 2022-12-24 DIAGNOSIS — H6981 Other specified disorders of Eustachian tube, right ear: Secondary | ICD-10-CM | POA: Diagnosis not present

## 2022-12-24 DIAGNOSIS — R42 Dizziness and giddiness: Secondary | ICD-10-CM | POA: Diagnosis not present

## 2023-01-14 DIAGNOSIS — H6123 Impacted cerumen, bilateral: Secondary | ICD-10-CM | POA: Diagnosis not present

## 2023-01-14 DIAGNOSIS — H903 Sensorineural hearing loss, bilateral: Secondary | ICD-10-CM | POA: Diagnosis not present

## 2023-01-15 DIAGNOSIS — M0609 Rheumatoid arthritis without rheumatoid factor, multiple sites: Secondary | ICD-10-CM | POA: Diagnosis not present

## 2023-02-12 DIAGNOSIS — M0609 Rheumatoid arthritis without rheumatoid factor, multiple sites: Secondary | ICD-10-CM | POA: Diagnosis not present

## 2023-02-13 ENCOUNTER — Emergency Department: Payer: No Typology Code available for payment source

## 2023-02-13 ENCOUNTER — Observation Stay
Admission: EM | Admit: 2023-02-13 | Discharge: 2023-02-18 | Disposition: A | Discharge status: Skilled Nursing Facility | Payer: No Typology Code available for payment source | Attending: Internal Medicine | Admitting: Internal Medicine

## 2023-02-13 ENCOUNTER — Other Ambulatory Visit: Payer: Self-pay

## 2023-02-13 ENCOUNTER — Inpatient Hospital Stay: Payer: No Typology Code available for payment source

## 2023-02-13 ENCOUNTER — Encounter: Payer: Self-pay | Admitting: Emergency Medicine

## 2023-02-13 DIAGNOSIS — M0600 Rheumatoid arthritis without rheumatoid factor, unspecified site: Secondary | ICD-10-CM | POA: Diagnosis present

## 2023-02-13 DIAGNOSIS — Z96653 Presence of artificial knee joint, bilateral: Secondary | ICD-10-CM | POA: Insufficient documentation

## 2023-02-13 DIAGNOSIS — M0609 Rheumatoid arthritis without rheumatoid factor, multiple sites: Secondary | ICD-10-CM | POA: Diagnosis present

## 2023-02-13 DIAGNOSIS — S12500A Unspecified displaced fracture of sixth cervical vertebra, initial encounter for closed fracture: Secondary | ICD-10-CM | POA: Diagnosis not present

## 2023-02-13 DIAGNOSIS — Z7901 Long term (current) use of anticoagulants: Secondary | ICD-10-CM | POA: Diagnosis not present

## 2023-02-13 DIAGNOSIS — S2239XA Fracture of one rib, unspecified side, initial encounter for closed fracture: Secondary | ICD-10-CM | POA: Insufficient documentation

## 2023-02-13 DIAGNOSIS — I10 Essential (primary) hypertension: Secondary | ICD-10-CM | POA: Diagnosis not present

## 2023-02-13 DIAGNOSIS — S22019A Unspecified fracture of first thoracic vertebra, initial encounter for closed fracture: Secondary | ICD-10-CM | POA: Diagnosis not present

## 2023-02-13 DIAGNOSIS — W010XXA Fall on same level from slipping, tripping and stumbling without subsequent striking against object, initial encounter: Secondary | ICD-10-CM | POA: Diagnosis not present

## 2023-02-13 DIAGNOSIS — Z9889 Other specified postprocedural states: Secondary | ICD-10-CM | POA: Diagnosis not present

## 2023-02-13 DIAGNOSIS — Z96612 Presence of left artificial shoulder joint: Secondary | ICD-10-CM | POA: Diagnosis not present

## 2023-02-13 DIAGNOSIS — S2020XA Contusion of thorax, unspecified, initial encounter: Principal | ICD-10-CM | POA: Insufficient documentation

## 2023-02-13 DIAGNOSIS — Z9181 History of falling: Secondary | ICD-10-CM

## 2023-02-13 DIAGNOSIS — S32009A Unspecified fracture of unspecified lumbar vertebra, initial encounter for closed fracture: Secondary | ICD-10-CM | POA: Diagnosis not present

## 2023-02-13 DIAGNOSIS — Z86711 Personal history of pulmonary embolism: Secondary | ICD-10-CM | POA: Diagnosis present

## 2023-02-13 DIAGNOSIS — M25519 Pain in unspecified shoulder: Secondary | ICD-10-CM | POA: Diagnosis not present

## 2023-02-13 DIAGNOSIS — G4700 Insomnia, unspecified: Secondary | ICD-10-CM | POA: Diagnosis not present

## 2023-02-13 DIAGNOSIS — L909 Atrophic disorder of skin, unspecified: Secondary | ICD-10-CM | POA: Diagnosis not present

## 2023-02-13 DIAGNOSIS — S0003XA Contusion of scalp, initial encounter: Secondary | ICD-10-CM | POA: Insufficient documentation

## 2023-02-13 DIAGNOSIS — S2243XA Multiple fractures of ribs, bilateral, initial encounter for closed fracture: Secondary | ICD-10-CM | POA: Diagnosis not present

## 2023-02-13 DIAGNOSIS — R296 Repeated falls: Secondary | ICD-10-CM

## 2023-02-13 DIAGNOSIS — S2249XA Multiple fractures of ribs, unspecified side, initial encounter for closed fracture: Secondary | ICD-10-CM | POA: Diagnosis present

## 2023-02-13 DIAGNOSIS — S32029A Unspecified fracture of second lumbar vertebra, initial encounter for closed fracture: Secondary | ICD-10-CM | POA: Diagnosis not present

## 2023-02-13 DIAGNOSIS — S12501A Unspecified nondisplaced fracture of sixth cervical vertebra, initial encounter for closed fracture: Secondary | ICD-10-CM | POA: Diagnosis not present

## 2023-02-13 DIAGNOSIS — Z86718 Personal history of other venous thrombosis and embolism: Secondary | ICD-10-CM | POA: Diagnosis not present

## 2023-02-13 DIAGNOSIS — S2231XA Fracture of one rib, right side, initial encounter for closed fracture: Secondary | ICD-10-CM | POA: Diagnosis not present

## 2023-02-13 DIAGNOSIS — M79662 Pain in left lower leg: Secondary | ICD-10-CM | POA: Diagnosis not present

## 2023-02-13 DIAGNOSIS — D72829 Elevated white blood cell count, unspecified: Secondary | ICD-10-CM | POA: Diagnosis not present

## 2023-02-13 DIAGNOSIS — M25512 Pain in left shoulder: Secondary | ICD-10-CM | POA: Diagnosis not present

## 2023-02-13 DIAGNOSIS — Z9049 Acquired absence of other specified parts of digestive tract: Secondary | ICD-10-CM

## 2023-02-13 DIAGNOSIS — M79671 Pain in right foot: Secondary | ICD-10-CM | POA: Diagnosis not present

## 2023-02-13 LAB — BASIC METABOLIC PANEL
Anion gap: 12 (ref 5–15)
BUN: 14 mg/dL (ref 8–23)
CO2: 25 mmol/L (ref 22–32)
Calcium: 9.2 mg/dL (ref 8.9–10.3)
Chloride: 98 mmol/L (ref 98–111)
Creatinine, Ser: 0.44 mg/dL (ref 0.44–1.00)
GFR, Estimated: >60 mL/min (ref >60)
Glucose, Bld: 112 mg/dL — ABNORMAL HIGH (ref 70–99)
Potassium: 4.2 mmol/L (ref 3.5–5.1)
Sodium: 135 mmol/L (ref 135–145)

## 2023-02-13 LAB — CBC WITH DIFFERENTIAL/PLATELET
Abs Immature Granulocytes: 0.05 10*3/uL (ref 0.00–0.07)
Basophils Absolute: 0 10*3/uL (ref 0.0–0.1)
Basophils Relative: 0 %
Eosinophils Absolute: 0 10*3/uL (ref 0.0–0.5)
Eosinophils Relative: 0 %
HCT: 43.7 % (ref 36.0–46.0)
Hemoglobin: 14.2 g/dL (ref 12.0–15.0)
Immature Granulocytes: 0 %
Lymphocytes Relative: 7 %
Lymphs Abs: 0.8 10*3/uL (ref 0.7–4.0)
MCH: 31.7 pg (ref 26.0–34.0)
MCHC: 32.5 g/dL (ref 30.0–36.0)
MCV: 97.5 fL (ref 80.0–100.0)
Monocytes Absolute: 0.7 10*3/uL (ref 0.1–1.0)
Monocytes Relative: 6 %
Neutro Abs: 10.3 10*3/uL — ABNORMAL HIGH (ref 1.7–7.7)
Neutrophils Relative %: 87 %
Platelets: 242 10*3/uL (ref 150–400)
RBC: 4.48 MIL/uL (ref 3.87–5.11)
RDW: 13.6 % (ref 11.5–15.5)
WBC: 11.9 10*3/uL — ABNORMAL HIGH (ref 4.0–10.5)
nRBC: 0 % (ref 0.0–0.2)

## 2023-02-13 MED ORDER — MELATONIN 5 MG PO TABS
5.0000 mg | ORAL_TABLET | Freq: Every evening | ORAL | Status: DC | PRN
  Administered 2023-02-13 – 2023-02-17 (×4): 5 mg via ORAL
  Filled 2023-02-13 (×4): qty 1

## 2023-02-13 MED ORDER — ONDANSETRON HCL 4 MG PO TABS: 4.0000 mg | ORAL_TABLET | Freq: Four times a day (QID) | ORAL | Status: DC | PRN

## 2023-02-13 MED ORDER — APIXABAN 5 MG PO TABS
5.0000 mg | ORAL_TABLET | Freq: Two times a day (BID) | ORAL | Status: DC
  Administered 2023-02-13 – 2023-02-18 (×10): 5 mg via ORAL
  Filled 2023-02-13 (×10): qty 1

## 2023-02-13 MED ORDER — DOCUSATE SODIUM 100 MG PO CAPS
100.0000 mg | ORAL_CAPSULE | Freq: Every day | ORAL | Status: DC
  Administered 2023-02-13: 100 mg via ORAL
  Filled 2023-02-13 (×4): qty 1

## 2023-02-13 MED ORDER — ADULT MULTIVITAMIN W/MINERALS CH
1.0000 | ORAL_TABLET | Freq: Every day | ORAL | Status: DC
  Administered 2023-02-14 – 2023-02-18 (×5): 1 via ORAL
  Filled 2023-02-13 (×5): qty 1

## 2023-02-13 MED ORDER — LIDOCAINE 5 % EX PTCH
1.0000 | MEDICATED_PATCH | CUTANEOUS | Status: DC
  Administered 2023-02-13 – 2023-02-16 (×4): 1 via TRANSDERMAL
  Filled 2023-02-13 (×6): qty 1

## 2023-02-13 MED ORDER — OXYCODONE HCL 5 MG PO TABS
5.0000 mg | ORAL_TABLET | Freq: Four times a day (QID) | ORAL | Status: DC | PRN
  Filled 2023-02-13: qty 1

## 2023-02-13 MED ORDER — ACETAMINOPHEN 500 MG PO TABS
1000.0000 mg | ORAL_TABLET | Freq: Once | ORAL | Status: AC
  Administered 2023-02-13: 1000 mg via ORAL
  Filled 2023-02-13: qty 2

## 2023-02-13 MED ORDER — ACETAMINOPHEN 650 MG RE SUPP: 650.0000 mg | Freq: Four times a day (QID) | RECTAL | Status: DC | PRN

## 2023-02-13 MED ORDER — METHOTREXATE SODIUM CHEMO INJECTION 50 MG/2ML
20.0000 mg | INTRAMUSCULAR | Status: DC
  Filled 2023-02-13: qty 0.8

## 2023-02-13 MED ORDER — OYSTER SHELL CALCIUM/D3 500-5 MG-MCG PO TABS
1.0000 | ORAL_TABLET | Freq: Every day | ORAL | Status: DC
  Administered 2023-02-14 – 2023-02-18 (×5): 1 via ORAL
  Filled 2023-02-13 (×5): qty 1

## 2023-02-13 MED ORDER — ONDANSETRON HCL 4 MG/2ML IJ SOLN
4.0000 mg | Freq: Four times a day (QID) | INTRAMUSCULAR | Status: DC | PRN
  Filled 2023-02-13: qty 2

## 2023-02-13 MED ORDER — POLYVINYL ALCOHOL 1.4 % OP SOLN: 1.0000 [drp] | Freq: Three times a day (TID) | OPHTHALMIC | Status: DC | PRN

## 2023-02-13 MED ORDER — DONEPEZIL HCL 5 MG PO TABS
5.0000 mg | ORAL_TABLET | Freq: Every day | ORAL | Status: DC
  Administered 2023-02-14 – 2023-02-17 (×4): 5 mg via ORAL
  Filled 2023-02-13 (×4): qty 1

## 2023-02-13 MED ORDER — HYDROXYCHLOROQUINE SULFATE 200 MG PO TABS: 200.0000 mg | ORAL_TABLET | Freq: Every day | ORAL | Status: DC

## 2023-02-13 MED ORDER — FOLIC ACID 1 MG PO TABS
2.0000 mg | ORAL_TABLET | Freq: Every day | ORAL | Status: DC
  Administered 2023-02-13 – 2023-02-18 (×6): 2 mg via ORAL
  Filled 2023-02-13 (×6): qty 2

## 2023-02-13 MED ORDER — ACETAMINOPHEN 325 MG PO TABS
650.0000 mg | ORAL_TABLET | Freq: Four times a day (QID) | ORAL | Status: DC | PRN
  Administered 2023-02-14: 650 mg via ORAL
  Filled 2023-02-13: qty 2

## 2023-02-13 NOTE — Assessment & Plan Note (Addendum)
Family report patient has had 13 falls, 4 of the last 5 falls have resulted in fractures.  Pt lives alone at home independent.  Given that she has fractured vertebrae including cervical, thoracic, lumbar and ribs, patient will have limited ability to perform her activities of daily living. --PT, OT, TOC consulted --Symptomatic support --Fall precautions

## 2023-02-13 NOTE — ED Notes (Signed)
States she felt better after tylenol  C-collar applied    Positioned in bed

## 2023-02-13 NOTE — Assessment & Plan Note (Addendum)
On Eliquis

## 2023-02-13 NOTE — Assessment & Plan Note (Addendum)
Suspect reactive in setting of multiple rib fracture and spinal fractures.  CXR no pneumonia.   --UA pending - follow results --Monitor CBC --No indication for antibiotics at this time --Monitor clinically for s/sx's of infection

## 2023-02-13 NOTE — Assessment & Plan Note (Addendum)
No bracing needed per Neurosurgery Pain control per orders PT/OT

## 2023-02-13 NOTE — H&P (Signed)
History and Physical   Teresa Lin HYQ:[id number] DOB: March 21, 1932 DOA: 02/13/2023  PCP: Lynnea Ferrier, MD  Patient coming from: Home via EMS  I have personally briefly reviewed patient's old medical records in [facility] EMR.  Chief Concern: Fall  HPI: Ms. Teresa Lin is an 89 year old female female with history of seronegative RA, history of DVT, PE, on anticoagulation with Eliquis, who presents emergency department for chief concerns of tripping and falling in her yard.  Vitals in the ED showed temperature 98, respiration rate 18, heart rate of 70, blood pressure 170/60, SpO2 98% on room air.  Serum sodium serum 0 135, potassium 4.2, chloride 98, bicarb 25, nonfasting blood glucose 112, BUN of 14, serum creatinine 0.44, eGFR > 60.  WBC 11.9, hemoglobin 14.2, platelets of 242.  CT head, cervical spine, thoracic, lumbar without contrast were ordered and has been read per radiology.  Tib-fib left XR read as no fracture of the left tibia or fibula.  Left shoulder XR: No fracture or dislocation of the left shoulder.  ED treatment: Lidocaine patch 5% one-time dose ordered. -------------------------------- At bedside, patient was able to tell me her name, age, location, current calendar year.  She reports that she was out on her porch and walking down approximately 5 steps to her yard when she slipped and fell.  She denies loss of consciousness.  She reports that she did not have any chest pain, shortness of breath before the fall.  She endorses some back pain.  She states that the pain is relieved with Tylenol as needed.  Social history: She lives at home on her own.  She denies tobacco, EtOH, recreational drug use.  She is retired and formerly worked as a [employer] officer.  ROS: Constitutional: no weight change, no fever ENT/Mouth: no sore throat, no rhinorrhea Eyes: no eye pain, no vision changes Cardiovascular: no chest pain, no dyspnea,  no edema, no  palpitations Respiratory: no cough, no sputum, no wheezing Gastrointestinal: no nausea, no vomiting, no diarrhea, no constipation Genitourinary: no urinary incontinence, no dysuria, no hematuria Musculoskeletal: no arthralgias, no myalgias Skin: no skin lesions, no pruritus, Neuro: + weakness, no loss of consciousness, no syncope Psych: no anxiety, no depression, no decrease appetite Heme/Lymph: no bruising, no bleeding  ED Course: Discussed with emergency medicine provider, patient requiring hospitalization for chief concerns of pain control, PT, OT, possible placement.  Assessment/Plan  Principal Problem:   At risk for falling Active Problems:   History of pulmonary embolism   Rheumatoid arthritis of multiple sites without rheumatoid factor (HCC)   Insomnia   Seronegative rheumatoid arthritis affecting lower leg (HCC)   Frequent falls   S/P right colectomy   History of DVT (deep vein thrombosis)   Fracture of cervical vertebra, C6 (HCC)   T1 vertebral fracture (HCC)   Rib fractures   Lumbar transverse process fracture (HCC)   Leukocytosis   Assessment and Plan:  * At risk for falling Fall precaution Patient lives at home on her own, and given that she has fractured vertebrae including cervical, thoracic, lumbar and ribs, patient will have limited ability to perform her activities of daily living PT, OT, TOC consulted Symptomatic support Admit to inpatient, MedSurg  History of pulmonary embolism Home Eliquis 5 mg p.o. twice daily resumed on admission  Leukocytosis Suspect reactive in setting of multiple rib fracture and spinal fracture including cervical, thoracic, lumbar However given patient's age, we will check a UA, portable chest x-ray If UA  and or portable chest x-ray is positive for pneumonia, would recommend a.m. team initiate antibiotic coverage Repeat CBC in the a.m.  Lumbar transverse process fracture (HCC) Right L2, L3, L4 transverse processes  fractures  Rib fractures Acute nondisplaced fractures through right first rib Acute fractures of the posterior left 4th-6th ribs Small amount of extrapleural blood products adjacent to the rib fractures, no pneumothorax Incentive spirometry, flutter valve Respiratory team to consulted: To educate patient on appropriate I-S and flutter valve use to prevent atelectasis  T1 vertebral fracture (HCC) Possible nondisplaced fracture through T1 spinous process  Fracture of cervical vertebra, C6 (HCC) Acute nondisplaced, through the inferior articular process of C6 bilaterally Acute nondisplaced fracture through the left lamina at C6 Per EDP, they called neurosurgery who states that nothing to do surgically recommend C-spine immobilization with outpatient follow-up  History of DVT (deep vein thrombosis) Home Eliquis 5 mg p.o. twice daily resumed  Insomnia Melatonin 5 mg nightly as needed for sleep  As needed pain medication, it is patient's preference to prefer acetaminophen first.  Chart reviewed.   DVT prophylaxis: Eliquis 5 mg p.o. twice daily Code Status: Full code Diet: Heart healthy Family Communication: Discussed and updated with son Wrike at bedside with patient's permission Disposition Plan: Pending clinical course Consults called: PT, OT, TOC Admission status: MedSurg, inpatient  Past Medical History:  Diagnosis Date   Anemia    Anxiety    Arthritis    osteoarthritis. Bilateral knee replacement, hands, meniscal tear, cervical disc disease   Atherosclerosis    Atypical chest pain    Collagen vascular disease (HCC)    Colon polyp 12/15/2017   Colon polyps    Compression fracture of L2 (HCC)    Diverticulosis    DVT (deep venous thrombosis) (HCC)    Fracture of one rib, right side, subsequent encounter for fracture with routine healing 03/08/2017   GERD (gastroesophageal reflux disease)    History of seronegative inflammatory arthritis    HOH (hard of hearing)    no  hearing aids   Hypertension    IBS (irritable bowel syndrome)    Internal hemorrhoids    Phlebitis and thrombophlebitis of deep veins of lower extremities (HCC)    after surgery   Vision abnormalities    Past Surgical History:  Procedure Laterality Date   APPENDECTOMY     COLONOSCOPY N/A 01/24/2020   Procedure: COLONOSCOPY;  Surgeon: Regis Bill, MD;  Location: [facility] ENDOSCOPY;  Service: Endoscopy;  Laterality: N/A;   COLONOSCOPY WITH PROPOFOL N/A 10/24/2017   Procedure: COLONOSCOPY WITH PROPOFOL;  Surgeon: Scot Jun, MD;  Location: [facility] ENDOSCOPY;  Service: Endoscopy;  Laterality: N/A;   COLONOSCOPY WITH PROPOFOL N/A 11/21/2017   Procedure: COLONOSCOPY WITH PROPOFOL;  Surgeon: Scot Jun, MD;  Location: [facility] ENDOSCOPY;  Service: Endoscopy;  Laterality: N/A;   DILATION AND CURETTAGE OF UTERUS     ESOPHAGOGASTRODUODENOSCOPY (EGD) WITH PROPOFOL N/A 02/25/2018   Procedure: ESOPHAGOGASTRODUODENOSCOPY (EGD) WITH PROPOFOL;  Surgeon: Scot Jun, MD;  Location: [facility] ENDOSCOPY;  Service: Endoscopy;  Laterality: N/A;   EYE SURGERY     bilateral catarACT   JOINT REPLACEMENT Bilateral    knee replacement   LAPAROSCOPIC RIGHT COLECTOMY Right 12/15/2017   Procedure: LAPAROSCOPIC RIGHT COLECTOMY;  Surgeon: Earline Mayotte, MD;  Location: [facility] ORS;  Service: General;  Laterality: Right;   ORIF HUMERUS FRACTURE Right 08/09/2021   Procedure: IRRIGATION AND DEBRIDEMENT; OPEN REDUCTION INTERNAL FIXATION (ORIF) DISTAL HUMERUS FRACTURE;  Surgeon: Myrene Galas, MD;  Location: [facility] OR;  Service: Orthopedics;  Laterality: Right;   REPLACEMENT TOTAL KNEE BILATERAL     TOTAL SHOULDER ARTHROPLASTY Right 09/24/2018   Procedure: TOTAL SHOULDER ARTHROPLASTY - REVERSE RIGHT;  Surgeon: Christena Flake, MD;  Location: [facility] ORS;  Service: Orthopedics;  Laterality: Right;   Social History:  reports that she has never smoked. She has never used smokeless tobacco. She reports current alcohol use of  about 2.0 standard drinks of alcohol per week. She reports that she does not use drugs.  Allergies  Allergen Reactions   Infliximab Other (See Comments)    Shaking, pain   Lidocaine-Menthol    Synvisc [Hylan G-F 20] Swelling   Family History  Problem Relation Age of Onset   Hypertension Mother    Cancer Mother 73       colon ca   Heart attack Mother    Hypertension Father    Heart attack Father    Hypertension Sister    Heart attack Sister    Multiple sclerosis Daughter    Multiple sclerosis Son    Multiple sclerosis Son    Family history: Family history reviewed and not pertinent.  Prior to Admission medications   Medication Sig Start Date End Date Taking? Authorizing Provider  Abatacept (ORENCIA IV) Inject into the vein every 30 (thirty) days.    [provider]  acetaminophen (TYLENOL) 500 MG tablet Take 1 tablet (500 mg total) by mouth every 6 (six) hours as needed for mild pain. 08/12/21   Osvaldo Shipper, MD  apixaban (ELIQUIS) 5 MG TABS tablet Take 5 mg by mouth 2 (two) times daily.    [provider]  calcium-vitamin D (OSCAL WITH D) 500-200 MG-UNIT per tablet Take 1 tablet by mouth daily.     [provider]  donepezil (ARICEPT) 5 MG tablet Take 5 mg by mouth at bedtime.    [provider]  folic acid (FOLVITE) 1 MG tablet Take 2 mg by mouth daily.     [provider]  hydroxychloroquine (PLAQUENIL) 200 MG tablet Take 200 mg by mouth daily.    [provider]  methotrexate 50 MG/2ML injection Inject 20 mg into the skin every Sunday.  03/16/15   [provider]  Multiple Vitamins-Minerals (MULTIVITAMIN WITH MINERALS) tablet Take 1 tablet by mouth daily. Centrum Silver    [provider]  ondansetron (ZOFRAN ODT) 4 MG disintegrating tablet Take 1 tablet (4 mg total) by mouth every 6 (six) hours as needed for nausea or vomiting. 11/25/20   Sharyn Creamer, MD  oxyCODONE (OXY IR/ROXICODONE) 5 MG immediate  release tablet Take 1 tablet (5 mg total) by mouth every 4 (four) hours as needed for severe pain. 08/12/21   Osvaldo Shipper, MD  Polyethyl Glycol-Propyl Glycol 0.4-0.3 % SOLN Place 1 drop into both eyes 3 (three) times daily as needed (for eye burning/irritation).    [provider]  polyethylene glycol (MIRALAX / GLYCOLAX) 17 g packet Take 17 g by mouth daily as needed. 08/12/21   Osvaldo Shipper, MD   Physical Exam: Vitals:   02/13/23 1244 02/13/23 1654 02/13/23 1747 02/13/23 1823  BP:  (!) 170/60 (!) 160/68 135/70  Pulse:  70 72 71  Resp:  18 18 16   Temp:  98 F (36.7 C)  98.1 F (36.7 C)  TempSrc:  Oral  Oral  SpO2:  98% 98% 100%  Weight: 70.8 kg     Height: 5\' 6"  (1.676 m)      Constitutional:  appears frail, NAD, calm Eyes: PERRL, lids and conjunctivae normal ENMT: Mucous membranes are moist. Posterior pharynx clear of any exudate or lesions. Age-appropriate dentition. Hearing appropriate. Neck: normal, supple, no masses, no thyromegaly Respiratory: clear to auscultation bilaterally, no wheezing, no crackles. Normal respiratory effort. No accessory muscle use.  Cardiovascular: Regular rate and rhythm, no murmurs / rubs / gallops. No extremity edema. 2+ pedal pulses. No carotid bruits.  Abdomen: no tenderness, no masses palpated, no hepatosplenomegaly. Bowel sounds positive.  Musculoskeletal: no clubbing / cyanosis. No joint deformity upper and lower extremities. Good ROM, no contractures, no atrophy. Normal muscle tone.  Skin: Mild ecchymosis on the right upper lip consistent with recent fall Neurologic: Sensation intact. Strength 5/5 in all 4.  Psychiatric: Normal judgment and insight. Alert and oriented x 3. Normal mood.   EKG: Not indicated at this time  X-ray on Admission: I personally reviewed and I agree with radiologist reading as below.  CT Head Wo Contrast  Result Date: 02/13/2023 CLINICAL DATA:  Head trauma, minor (Age >= 65y); Neck trauma (Age >= 65y); Back  trauma, no prior imaging (Age >= 16y) EXAM: CT HEAD WITHOUT CONTRAST CT CERVICAL SPINE WITHOUT CONTRAST CT THORACIC SPINE WITHOUT CONTRAST CT LUMBAR SPINE WITHOUT CONTRAST TECHNIQUE: Multidetector CT imaging of the head, cervical spine, thoracic spine, and lumbar spine was performed following the standard protocol without intravenous contrast. Multiplanar CT image reconstructions of the cervical spine were also generated. RADIATION DOSE REDUCTION: This exam was performed according to the departmental dose-optimization program which includes automated exposure control, adjustment of the mA and/or kV according to patient size and/or use of iterative reconstruction technique. COMPARISON:  CT Head and C Spine 03/20/22, CTA Chest 05/29/21, CT AP 07/03/18 FINDINGS: CT HEAD FINDINGS Brain: No hemorrhage. No hydrocephalus. No extra-axial fluid collection. No CT evidence of an acute cortical infarct. There is sequela of severe chronic microvascular ischemic change. No mass effect. No mass lesion Vascular: No hyperdense vessel or unexpected calcification. Skull: Soft tissue hematoma along the posterior scalp. No evidence of an underlying calvarial fracture. Sinuses/Orbits: No middle ear or mastoid effusion. Paranasal sinuses are clear. Bilateral lens replacement. Orbits are otherwise unremarkable. Other: None. CT CERVICAL SPINE FINDINGS Alignment: Grade 1 anterolisthesis of C4 on C5. Skull base and vertebrae: Acute nondisplaced fracture through the inferior articular process of C6 on the right (series 6, image 33) left (series 6, image 45-26). Acute nondisplaced fracture through the left lamina at C6 (series 3, image 50). Soft tissues and spinal canal: No prevertebral fluid or swelling. No visible canal hematoma. 1.5 cm left thyroid nodule. Disc levels:  No evidence of high-grade spinal canal stenosis. Upper chest: Negative. CT THORACIC SPINE FINDINGS Alignment: Normal. Vertebrae: Possible nondisplaced fractures through the T1  spinous process (series 6, image 44). Paraspinal and other soft tissues: Acute nondisplaced fractures through the right first rib (series 3, image 65). Acute fractures of the posterior left fourth-sixth ribs. There is a small amount of extrapleural blood products adjacent to the rib fractures. No pneumothorax. Disc levels: No evidence of high-grade spinal canal stenosis. CT LUMBAR SPINE FINDINGS Segmentation: 5 lumbar type vertebrae. Alignment: Normal. Vertebrae: Chronic superior endplate compression deformity at L2. acute fractures through the right L2 transverse process (series 4, image 162), L3 transverse process (series 4, image 180), L4 transverse process (series 4, image 69). Paraspinal and other soft tissues: Diverticulosis without evidence of diverticulitis. Disc levels: No evidence of high-grade spinal canal stenosis. IMPRESSION: 1. Soft tissue hematoma along the posterior scalp.  No evidence of an underlying calvarial fracture or intracranial injury. 2. Acute nondisplaced fractures through the inferior articular process of C6 bilaterally. Acute nondisplaced fracture through the left lamina at C6. 3. Possible nondisplaced fractures through the T1 spinous process. 4. Acute nondisplaced fractures through the right first rib. Acute fractures of the posterior left fourth-sixth ribs. Small amount of extrapleural blood products adjacent to the rib fractures. No pneumothorax. 5. Acute fractures through the right L2, L3, and L4 transverse processes. 6. 1.5 cm left thyroid nodule. Consider further evaluation with a dedicated thyroid ultrasound if consistent with patient's goals of care. Electronically Signed   By: Lorenza Cambridge M.D.   On: 02/13/2023 16:42   CT Cervical Spine Wo Contrast  Result Date: 02/13/2023 CLINICAL DATA:  Head trauma, minor (Age >= 65y); Neck trauma (Age >= 65y); Back trauma, no prior imaging (Age >= 16y) EXAM: CT HEAD WITHOUT CONTRAST CT CERVICAL SPINE WITHOUT CONTRAST CT THORACIC SPINE  WITHOUT CONTRAST CT LUMBAR SPINE WITHOUT CONTRAST TECHNIQUE: Multidetector CT imaging of the head, cervical spine, thoracic spine, and lumbar spine was performed following the standard protocol without intravenous contrast. Multiplanar CT image reconstructions of the cervical spine were also generated. RADIATION DOSE REDUCTION: This exam was performed according to the departmental dose-optimization program which includes automated exposure control, adjustment of the mA and/or kV according to patient size and/or use of iterative reconstruction technique. COMPARISON:  CT Head and C Spine 03/20/22, CTA Chest 05/29/21, CT AP 07/03/18 FINDINGS: CT HEAD FINDINGS Brain: No hemorrhage. No hydrocephalus. No extra-axial fluid collection. No CT evidence of an acute cortical infarct. There is sequela of severe chronic microvascular ischemic change. No mass effect. No mass lesion Vascular: No hyperdense vessel or unexpected calcification. Skull: Soft tissue hematoma along the posterior scalp. No evidence of an underlying calvarial fracture. Sinuses/Orbits: No middle ear or mastoid effusion. Paranasal sinuses are clear. Bilateral lens replacement. Orbits are otherwise unremarkable. Other: None. CT CERVICAL SPINE FINDINGS Alignment: Grade 1 anterolisthesis of C4 on C5. Skull base and vertebrae: Acute nondisplaced fracture through the inferior articular process of C6 on the right (series 6, image 33) left (series 6, image 45-26). Acute nondisplaced fracture through the left lamina at C6 (series 3, image 50). Soft tissues and spinal canal: No prevertebral fluid or swelling. No visible canal hematoma. 1.5 cm left thyroid nodule. Disc levels:  No evidence of high-grade spinal canal stenosis. Upper chest: Negative. CT THORACIC SPINE FINDINGS Alignment: Normal. Vertebrae: Possible nondisplaced fractures through the T1 spinous process (series 6, image 44). Paraspinal and other soft tissues: Acute nondisplaced fractures through the right  first rib (series 3, image 65). Acute fractures of the posterior left fourth-sixth ribs. There is a small amount of extrapleural blood products adjacent to the rib fractures. No pneumothorax. Disc levels: No evidence of high-grade spinal canal stenosis. CT LUMBAR SPINE FINDINGS Segmentation: 5 lumbar type vertebrae. Alignment: Normal. Vertebrae: Chronic superior endplate compression deformity at L2. acute fractures through the right L2 transverse process (series 4, image 162), L3 transverse process (series 4, image 180), L4 transverse process (series 4, image 69). Paraspinal and other soft tissues: Diverticulosis without evidence of diverticulitis. Disc levels: No evidence of high-grade spinal canal stenosis. IMPRESSION: 1. Soft tissue hematoma along the posterior scalp. No evidence of an underlying calvarial fracture or intracranial injury. 2. Acute nondisplaced fractures through the inferior articular process of C6 bilaterally. Acute nondisplaced fracture through the left lamina at C6. 3. Possible nondisplaced fractures through the T1 spinous process. 4. Acute nondisplaced  fractures through the right first rib. Acute fractures of the posterior left fourth-sixth ribs. Small amount of extrapleural blood products adjacent to the rib fractures. No pneumothorax. 5. Acute fractures through the right L2, L3, and L4 transverse processes. 6. 1.5 cm left thyroid nodule. Consider further evaluation with a dedicated thyroid ultrasound if consistent with patient's goals of care. Electronically Signed   By: Lorenza Cambridge M.D.   On: 02/13/2023 16:42   CT Thoracic Spine Wo Contrast  Result Date: 02/13/2023 CLINICAL DATA:  Head trauma, minor (Age >= 65y); Neck trauma (Age >= 65y); Back trauma, no prior imaging (Age >= 16y) EXAM: CT HEAD WITHOUT CONTRAST CT CERVICAL SPINE WITHOUT CONTRAST CT THORACIC SPINE WITHOUT CONTRAST CT LUMBAR SPINE WITHOUT CONTRAST TECHNIQUE: Multidetector CT imaging of the head, cervical spine, thoracic  spine, and lumbar spine was performed following the standard protocol without intravenous contrast. Multiplanar CT image reconstructions of the cervical spine were also generated. RADIATION DOSE REDUCTION: This exam was performed according to the departmental dose-optimization program which includes automated exposure control, adjustment of the mA and/or kV according to patient size and/or use of iterative reconstruction technique. COMPARISON:  CT Head and C Spine 03/20/22, CTA Chest 05/29/21, CT AP 07/03/18 FINDINGS: CT HEAD FINDINGS Brain: No hemorrhage. No hydrocephalus. No extra-axial fluid collection. No CT evidence of an acute cortical infarct. There is sequela of severe chronic microvascular ischemic change. No mass effect. No mass lesion Vascular: No hyperdense vessel or unexpected calcification. Skull: Soft tissue hematoma along the posterior scalp. No evidence of an underlying calvarial fracture. Sinuses/Orbits: No middle ear or mastoid effusion. Paranasal sinuses are clear. Bilateral lens replacement. Orbits are otherwise unremarkable. Other: None. CT CERVICAL SPINE FINDINGS Alignment: Grade 1 anterolisthesis of C4 on C5. Skull base and vertebrae: Acute nondisplaced fracture through the inferior articular process of C6 on the right (series 6, image 33) left (series 6, image 45-26). Acute nondisplaced fracture through the left lamina at C6 (series 3, image 50). Soft tissues and spinal canal: No prevertebral fluid or swelling. No visible canal hematoma. 1.5 cm left thyroid nodule. Disc levels:  No evidence of high-grade spinal canal stenosis. Upper chest: Negative. CT THORACIC SPINE FINDINGS Alignment: Normal. Vertebrae: Possible nondisplaced fractures through the T1 spinous process (series 6, image 44). Paraspinal and other soft tissues: Acute nondisplaced fractures through the right first rib (series 3, image 65). Acute fractures of the posterior left fourth-sixth ribs. There is a small amount of  extrapleural blood products adjacent to the rib fractures. No pneumothorax. Disc levels: No evidence of high-grade spinal canal stenosis. CT LUMBAR SPINE FINDINGS Segmentation: 5 lumbar type vertebrae. Alignment: Normal. Vertebrae: Chronic superior endplate compression deformity at L2. acute fractures through the right L2 transverse process (series 4, image 162), L3 transverse process (series 4, image 180), L4 transverse process (series 4, image 69). Paraspinal and other soft tissues: Diverticulosis without evidence of diverticulitis. Disc levels: No evidence of high-grade spinal canal stenosis. IMPRESSION: 1. Soft tissue hematoma along the posterior scalp. No evidence of an underlying calvarial fracture or intracranial injury. 2. Acute nondisplaced fractures through the inferior articular process of C6 bilaterally. Acute nondisplaced fracture through the left lamina at C6. 3. Possible nondisplaced fractures through the T1 spinous process. 4. Acute nondisplaced fractures through the right first rib. Acute fractures of the posterior left fourth-sixth ribs. Small amount of extrapleural blood products adjacent to the rib fractures. No pneumothorax. 5. Acute fractures through the right L2, L3, and L4 transverse processes. 6. 1.5 cm left  thyroid nodule. Consider further evaluation with a dedicated thyroid ultrasound if consistent with patient's goals of care. Electronically Signed   By: Lorenza Cambridge M.D.   On: 02/13/2023 16:42   CT Lumbar Spine Wo Contrast  Result Date: 02/13/2023 CLINICAL DATA:  Head trauma, minor (Age >= 65y); Neck trauma (Age >= 65y); Back trauma, no prior imaging (Age >= 16y) EXAM: CT HEAD WITHOUT CONTRAST CT CERVICAL SPINE WITHOUT CONTRAST CT THORACIC SPINE WITHOUT CONTRAST CT LUMBAR SPINE WITHOUT CONTRAST TECHNIQUE: Multidetector CT imaging of the head, cervical spine, thoracic spine, and lumbar spine was performed following the standard protocol without intravenous contrast. Multiplanar CT  image reconstructions of the cervical spine were also generated. RADIATION DOSE REDUCTION: This exam was performed according to the departmental dose-optimization program which includes automated exposure control, adjustment of the mA and/or kV according to patient size and/or use of iterative reconstruction technique. COMPARISON:  CT Head and C Spine 03/20/22, CTA Chest 05/29/21, CT AP 07/03/18 FINDINGS: CT HEAD FINDINGS Brain: No hemorrhage. No hydrocephalus. No extra-axial fluid collection. No CT evidence of an acute cortical infarct. There is sequela of severe chronic microvascular ischemic change. No mass effect. No mass lesion Vascular: No hyperdense vessel or unexpected calcification. Skull: Soft tissue hematoma along the posterior scalp. No evidence of an underlying calvarial fracture. Sinuses/Orbits: No middle ear or mastoid effusion. Paranasal sinuses are clear. Bilateral lens replacement. Orbits are otherwise unremarkable. Other: None. CT CERVICAL SPINE FINDINGS Alignment: Grade 1 anterolisthesis of C4 on C5. Skull base and vertebrae: Acute nondisplaced fracture through the inferior articular process of C6 on the right (series 6, image 33) left (series 6, image 45-26). Acute nondisplaced fracture through the left lamina at C6 (series 3, image 50). Soft tissues and spinal canal: No prevertebral fluid or swelling. No visible canal hematoma. 1.5 cm left thyroid nodule. Disc levels:  No evidence of high-grade spinal canal stenosis. Upper chest: Negative. CT THORACIC SPINE FINDINGS Alignment: Normal. Vertebrae: Possible nondisplaced fractures through the T1 spinous process (series 6, image 44). Paraspinal and other soft tissues: Acute nondisplaced fractures through the right first rib (series 3, image 65). Acute fractures of the posterior left fourth-sixth ribs. There is a small amount of extrapleural blood products adjacent to the rib fractures. No pneumothorax. Disc levels: No evidence of high-grade spinal  canal stenosis. CT LUMBAR SPINE FINDINGS Segmentation: 5 lumbar type vertebrae. Alignment: Normal. Vertebrae: Chronic superior endplate compression deformity at L2. acute fractures through the right L2 transverse process (series 4, image 162), L3 transverse process (series 4, image 180), L4 transverse process (series 4, image 69). Paraspinal and other soft tissues: Diverticulosis without evidence of diverticulitis. Disc levels: No evidence of high-grade spinal canal stenosis. IMPRESSION: 1. Soft tissue hematoma along the posterior scalp. No evidence of an underlying calvarial fracture or intracranial injury. 2. Acute nondisplaced fractures through the inferior articular process of C6 bilaterally. Acute nondisplaced fracture through the left lamina at C6. 3. Possible nondisplaced fractures through the T1 spinous process. 4. Acute nondisplaced fractures through the right first rib. Acute fractures of the posterior left fourth-sixth ribs. Small amount of extrapleural blood products adjacent to the rib fractures. No pneumothorax. 5. Acute fractures through the right L2, L3, and L4 transverse processes. 6. 1.5 cm left thyroid nodule. Consider further evaluation with a dedicated thyroid ultrasound if consistent with patient's goals of care. Electronically Signed   By: Lorenza Cambridge M.D.   On: 02/13/2023 16:42   DG Shoulder Left  Result Date: 02/13/2023 CLINICAL DATA:  Left shoulder pain after fall. EXAM: LEFT SHOULDER - 2+ VIEW COMPARISON:  None Available. FINDINGS: There is no evidence of fracture or dislocation. Clavicle is intact. Widening of the acromioclavicular joint without clavicular elevation. Subcortical cystic change in the lateral humeral head. Soft tissue calcifications in the region of the rotator cuff insertion. IMPRESSION: 1. No fracture or dislocation of the left shoulder. 2. Widening of the acromioclavicular joint without clavicular elevation, may be sequela of remote injury or prior surgery. 3. Soft  tissue calcifications in the region of the rotator cuff insertion may represent calcific tendinopathy. Electronically Signed   By: Narda Rutherford M.D.   On: 02/13/2023 16:02   DG Tibia/Fibula Left  Result Date: 02/13/2023 CLINICAL DATA:  Left lower leg pain after fall. EXAM: LEFT TIBIA AND FIBULA - 2 VIEW COMPARISON:  None Available. FINDINGS: There is no evidence of fracture or other focal bone lesions. Knee arthroplasty is intact where visualized. No ankle dislocation. Chronic vascular calcifications. IMPRESSION: No fracture of the left tibia or fibula. Electronically Signed   By: Narda Rutherford M.D.   On: 02/13/2023 16:01    Labs on Admission: I have personally reviewed following labs  CBC: Recent Labs  Lab 02/13/23 1714  WBC 11.9*  NEUTROABS 10.3*  HGB 14.2  HCT 43.7  MCV 97.5  PLT 242   Basic Metabolic Panel: Recent Labs  Lab 02/13/23 1714  NA 135  K 4.2  CL 98  CO2 25  GLUCOSE 112*  BUN 14  CREATININE 0.44  CALCIUM 9.2   GFR: Estimated Creatinine Clearance: 44.6 mL/min (by C-G formula based on SCr of 0.44 mg/dL).  Urine analysis:    Component Value Date/Time   COLORURINE YELLOW 05/29/2021 2143   APPEARANCEUR CLEAR 05/29/2021 2143   APPEARANCEUR Clear 11/10/2018 0911   LABSPEC 1.010 05/29/2021 2143   PHURINE 6.5 05/29/2021 2143   GLUCOSEU NEGATIVE 05/29/2021 2143   GLUCOSEU NEGATIVE 06/05/2016 1426   HGBUR MODERATE (A) 05/29/2021 2143   BILIRUBINUR NEGATIVE 05/29/2021 2143   BILIRUBINUR Negative 11/10/2018 0911   KETONESUR TRACE (A) 05/29/2021 2143   PROTEINUR NEGATIVE 05/29/2021 2143   UROBILINOGEN 0.2 06/05/2016 1436   UROBILINOGEN 0.2 06/05/2016 1426   NITRITE NEGATIVE 05/29/2021 2143   LEUKOCYTESUR SMALL (A) 05/29/2021 2143   This document was prepared using Dragon Voice Recognition software and may include unintentional dictation errors.  Dr. Sedalia Muta [facility]  If 7PM-7AM, please contact overnight-coverage provider If 7AM-7PM, please  contact day attending provider [email or web address]  02/13/2023, 7:12 PM

## 2023-02-13 NOTE — Assessment & Plan Note (Addendum)
-   Melatonin PRN  

## 2023-02-13 NOTE — Assessment & Plan Note (Addendum)
Right L2, L3, L4 transverse processes fractures Per Neurosurgery - no need for bracing. Pain control -- scheduled Tylenol PRN Tramadol, PRN IV morphine for severe pain

## 2023-02-13 NOTE — Assessment & Plan Note (Addendum)
Acute nondisplaced, through the inferior articular process of C6 bilaterally Acute nondisplaced fracture through the left lamina at C6 --Neurosurgery consulted -- appreciate input --Soft C collar (can be removed when in bed and for meals) --PT/OT --Follow up with Neurosurgery in 2-3 weeks for repeat xrays

## 2023-02-13 NOTE — ED Notes (Signed)
See triage note  Presents s/p fall  States she tripped in her yard  Having pain to left shoulder ,back and neck

## 2023-02-13 NOTE — Hospital Course (Addendum)
Teresa Lin is an 89 year old female female with history of seronegative RA, history of DVT, PE, on anticoagulation with Eliquis, who presents emergency department for chief concerns of tripping and falling in her yard.   See H&P for full HPI on admission.  In summary, patient was found to have multiple fractures including C6, T1, Right 1st rib, Left posterior ribs 4-6, and L2-L4 transverse processes.  Pt admitted for close monitoring and further management.  PT/OT recommend SNF placement for rehab which is appropriate given patients frequent fall results in multiple fractures, lack of 24/7 assistance in the home where she lives alone.  Further hospital course and management as outlined below.

## 2023-02-13 NOTE — Assessment & Plan Note (Addendum)
Acute nondisplaced fractures right first rib Acute fractures of the posterior left 4th-6th ribs Small amount of extrapleural blood products adjacent to the rib fractures, no pneumothorax --Incentive spirometry, flutter valve --Pain control per orders

## 2023-02-13 NOTE — ED Provider Notes (Signed)
Patient received in signout from Dr. Scotty Court pending follow-up imaging.  The patient's imaging shows evidence of multiple rib fractures no pneumothorax or hemothorax as well as bilateral fracture of the inferior articular process of C6 without foraminal widening or significant displacement.  She is neuro intact.  Also has multiple lumbar spinous process fractures.  She is essentially immobile due to pain and injury.  She lives at home alone.  Will need PT eval possible temporary placement in rehab.  I discussed the cervical fracture with Dr. Marcell Barlow with neurosurgery.  No indication for urgent or emergent intervention.  Will treat with immobilization in cervical collar and patient will have follow-up in clinic.   Willy Eddy, MD 02/13/23 [phone]

## 2023-02-13 NOTE — Progress Notes (Signed)
Patient admitted to room 113 from ED: patient is alert and in no apparent distress. Vital signs are stable. Will continue to monitor and assist as needed.

## 2023-02-13 NOTE — ED Provider Notes (Signed)
[facility] Provider Note    Event Date/Time   First MD Initiated Contact with Patient 02/13/23 1325     (approximate)   History   Chief Complaint: Fall   HPI  Teresa Lin is a 89 y.o. female with a past history of DVT, PE on Eliquis, hypertension who was at her usual state of health, walking inside well reading mail when she tripped over a step, fell down 5 or 6 brick steps outside.  Does not think she hit her head, denies loss of consciousness.  Complains of pain in the neck as well as upper back and lower back.  No paresthesias.  Also complains of pain in the left lower leg.  Last tetanus shot was within the last 5 years.  No chest pain or shortness of breath.     Physical Exam   Triage Vital Signs: ED Triage Vitals  Encounter Vitals Group     BP 02/13/23 1243 (!) 178/65     Systolic BP Percentile --      Diastolic BP Percentile --      Pulse Rate 02/13/23 1243 74     Resp 02/13/23 1243 19     Temp 02/13/23 1243 97.9 F (36.6 C)     Temp Source 02/13/23 1243 Oral     SpO2 02/13/23 1243 98 %     Weight 02/13/23 1244 156 lb (70.8 kg)     Height 02/13/23 1244 5\' 6"  (1.676 m)     Head Circumference --      Peak Flow --      Pain Score 02/13/23 1244 7     Pain Loc --      Pain Education --      Exclude from Growth Chart --     Most recent vital signs: Vitals:   02/13/23 1243  BP: (!) 178/65  Pulse: 74  Resp: 19  Temp: 97.9 F (36.6 C)  SpO2: 98%    General: Awake, no distress.  CV:  Good peripheral perfusion.  Regular rate and rhythm Resp:  Normal effort.  Clear to auscultation bilaterally Abd:  No distention.  Soft nontender Other:  Tenderness in the cervical, thoracic, and lumbar spine to palpation.  No step off or palpable deformity.  Intact range of motion in all 4 extremities.  There is tenderness over the proximal and distal left tibia.  There is some abrasion in the areas as well without laceration.  No deformity.   ED  Results / Procedures / Treatments   Labs (all labs ordered are listed, but only abnormal results are displayed) Labs Reviewed - No data to display   EKG    RADIOLOGY CT head interpreted by me, negative for ICH. Radiology report reviewed.   CT c/t/l spine pending   PROCEDURES:  Procedures   MEDICATIONS ORDERED IN ED: Medications  acetaminophen (TYLENOL) tablet 1,000 mg (has no administration in time range)     IMPRESSION / MDM / ASSESSMENT AND PLAN / ED COURSE  I reviewed the triage vital signs and the nursing notes.  DDx: Intracranial hemorrhage, C-spine fracture, thoracic spine fracture, lumbar spine fracture, tibial fracture, contusions  Patient's presentation is most consistent with acute presentation with potential threat to life or bodily function.  Patient presents with mechanical fall with multiple areas of blunt trauma and pain complaints.  No obvious deformities or serious wounds.  Will obtain imaging for trauma evaluation.       FINAL CLINICAL IMPRESSION(S) / ED  DIAGNOSES   Final diagnoses:  Multiple contusions of trunk, initial encounter     Rx / DC Orders   ED Discharge Orders     None        Note:  This document was prepared using Dragon voice recognition software and may include unintentional dictation errors.   Sharman Cheek, MD 02/13/23 [phone]

## 2023-02-13 NOTE — ED Triage Notes (Signed)
Arrives from home via [facility].  Larey Seat today while out getting mail.  Fell down 6 outdoor brick outdoor steps.  C/O left shoulder pain, neck pain, thoracic pain, right facial abrasion/bruising.  No LOC. Patient tripped and fell.  196/74

## 2023-02-13 NOTE — ED Triage Notes (Signed)
Pt sts that she fell after tripping over her outdoor plants. Pt sts that she is having back and shoulder pain.

## 2023-02-14 DIAGNOSIS — W19XXXA Unspecified fall, initial encounter: Secondary | ICD-10-CM | POA: Diagnosis not present

## 2023-02-14 DIAGNOSIS — Z9181 History of falling: Secondary | ICD-10-CM | POA: Diagnosis not present

## 2023-02-14 DIAGNOSIS — S12500A Unspecified displaced fracture of sixth cervical vertebra, initial encounter for closed fracture: Secondary | ICD-10-CM | POA: Diagnosis not present

## 2023-02-14 LAB — CBC
HCT: 39.3 % (ref 36.0–46.0)
Hemoglobin: 13.2 g/dL (ref 12.0–15.0)
MCH: 31.6 pg (ref 26.0–34.0)
MCHC: 33.6 g/dL (ref 30.0–36.0)
MCV: 94 fL (ref 80.0–100.0)
Platelets: 227 10*3/uL (ref 150–400)
RBC: 4.18 MIL/uL (ref 3.87–5.11)
RDW: 13.6 % (ref 11.5–15.5)
WBC: 6.5 10*3/uL (ref 4.0–10.5)
nRBC: 0 % (ref 0.0–0.2)

## 2023-02-14 LAB — BASIC METABOLIC PANEL
Anion gap: 7 (ref 5–15)
BUN: 14 mg/dL (ref 8–23)
CO2: 29 mmol/L (ref 22–32)
Calcium: 8.6 mg/dL — ABNORMAL LOW (ref 8.9–10.3)
Chloride: 103 mmol/L (ref 98–111)
Creatinine, Ser: 0.48 mg/dL (ref 0.44–1.00)
GFR, Estimated: >60 mL/min (ref >60)
Glucose, Bld: 105 mg/dL — ABNORMAL HIGH (ref 70–99)
Potassium: 3.9 mmol/L (ref 3.5–5.1)
Sodium: 139 mmol/L (ref 135–145)

## 2023-02-14 MED ORDER — HYDROXYCHLOROQUINE SULFATE 200 MG PO TABS
200.0000 mg | ORAL_TABLET | ORAL | Status: DC
  Administered 2023-02-15 – 2023-02-18 (×3): 200 mg via ORAL
  Filled 2023-02-14 (×3): qty 1

## 2023-02-14 MED ORDER — ACETAMINOPHEN 500 MG PO TABS
1000.0000 mg | ORAL_TABLET | Freq: Three times a day (TID) | ORAL | Status: DC
  Administered 2023-02-14 – 2023-02-17 (×11): 1000 mg via ORAL
  Filled 2023-02-14 (×10): qty 2

## 2023-02-14 MED ORDER — TRAMADOL HCL 50 MG PO TABS: 50.0000 mg | ORAL_TABLET | Freq: Four times a day (QID) | ORAL | Status: DC | PRN

## 2023-02-14 MED ORDER — HYDROXYCHLOROQUINE SULFATE 200 MG PO TABS
400.0000 mg | ORAL_TABLET | ORAL | Status: DC
  Administered 2023-02-14 – 2023-02-17 (×2): 400 mg via ORAL
  Filled 2023-02-14 (×2): qty 2

## 2023-02-14 MED ORDER — MORPHINE SULFATE (PF) 2 MG/ML IV SOLN: 2.0000 mg | INTRAVENOUS | Status: DC | PRN

## 2023-02-14 MED ORDER — ACETAMINOPHEN 500 MG PO TABS
1000.0000 mg | ORAL_TABLET | Freq: Three times a day (TID) | ORAL | Status: DC
Start: 2023-02-14 — End: 2023-02-14

## 2023-02-14 NOTE — Care Management Obs Status (Signed)
MEDICARE OBSERVATION STATUS NOTIFICATION   Patient Details  Name: Teresa Lin MRN: [id number] Date of Birth: Aug 02, 1931   Medicare Observation Status Notification Given:  Yes    Markia Kyer, LCSW 02/14/2023, 2:06 PM

## 2023-02-14 NOTE — Plan of Care (Signed)
  Problem: Education: Goal: Knowledge of General Education information will improve Description: Including pain rating scale, medication(s)/side effects and non-pharmacologic comfort measures Outcome: Progressing   Problem: Clinical Measurements: Goal: Ability to maintain clinical measurements within normal limits will improve Outcome: Progressing   Problem: Coping: Goal: Level of anxiety will decrease Outcome: Progressing   Problem: Elimination: Goal: Will not experience complications related to urinary retention Outcome: Progressing

## 2023-02-14 NOTE — TOC Progression Note (Addendum)
Transition of Care [facility]) - Progression Note    Patient Details  Name: Teresa Lin MRN: [id number] Date of Birth: May 27, 1931  Transition of Care [facility]) CM/SW Contact  Allena Katz, LCSW Phone Number: 02/14/2023, 3:40 PM  Clinical Narrative:   CSW spoke with son who reports that he would like to private pay at [facility] or [facility] if insurance does not cover SNF. CSW to start workup. Son reports pt has been on the [facility] waiting list for a while and hopes they would be able to assist for rehab.          Expected Discharge Plan and Services                                               Social Determinants of Health (SDOH) Interventions SDOH Screenings   Food Insecurity: No Food Insecurity (10/23/2022)   Received from [facility], [facility]  Transportation Needs: No Transportation Needs (10/23/2022)   Received from [facility], [facility]  Utilities: Not At Risk (10/23/2022)   Received from [facility], [facility]  Financial Resource Strain: Low Risk  (10/23/2022)   Received from [facility], [facility]  Physical Activity: Insufficiently Active (05/11/2020)   Received from [facility], [facility]  Social Connections: Socially Isolated (05/18/2020)   Received from [facility], [facility]  Stress: Stress Concern Present (05/17/2020)   Received from [facility], [facility]  Tobacco Use: Low Risk  (02/13/2023)    Readmission Risk Interventions     No data to display

## 2023-02-14 NOTE — Progress Notes (Signed)
Full note to follow.  Consulted for cervical spine inferior articular process fractures. There's no evidence of instability. She'll be treated in a collar for this.  She has stable lumbar transverse process fractures. She has chronic compression fracture. There's no need to brace these fractures. They may be painful, but do not affect stability of the lumbar spine. She is cleared to work with physical and Occupational therapy with standard precautions.

## 2023-02-14 NOTE — Consult Note (Signed)
Neurosurgery-New Consultation Evaluation 02/14/2023 Teresa Lin [id number]  Identifying Statement: Teresa Lin is a 89 y.o. female from [place] [address] with seronegative RA, history of DVT, PE, on anticoagulation with Eliquis   Physician Requesting Consultation: No ref. provider found  History of Present Illness: Teresa Lin is a 89 y.o female presenting to the emergency department on 02/13/2023 after tripping and falling in her yard.  She was on her porch and walking down approximately 5 steps when she slipped and fell.  She denies any loss of consciousness. Imaging in the ED revealed right L2-4 TP fractures, possible T1 spinous process fracture, bilateral C6 inferior articular process fractures, and left C6 laminar fracture. Today she reports some pain in her back with leg movement but denies any radiating leg pain.  She also endorses some neck pain with movement without any radiculopathy.  Past Medical History:  Past Medical History:  Diagnosis Date   Anemia    Anxiety    Arthritis    osteoarthritis. Bilateral knee replacement, hands, meniscal tear, cervical disc disease   Atherosclerosis    Atypical chest pain    Collagen vascular disease (HCC)    Colon polyp 12/15/2017   Colon polyps    Compression fracture of L2 (HCC)    Diverticulosis    DVT (deep venous thrombosis) (HCC)    Fracture of one rib, right side, subsequent encounter for fracture with routine healing 03/08/2017   GERD (gastroesophageal reflux disease)    History of seronegative inflammatory arthritis    HOH (hard of hearing)    no hearing aids   Hypertension    IBS (irritable bowel syndrome)    Internal hemorrhoids    Phlebitis and thrombophlebitis of deep veins of lower extremities (HCC)    after surgery   Vision abnormalities     Social History: Social History   Socioeconomic History   Marital status: Widowed    Spouse name: Not on file   Number of children: Not on file   Years of education: Not  on file   Highest education level: Not on file  Occupational History   Not on file  Tobacco Use   Smoking status: Never   Smokeless tobacco: Never  Vaping Use   Vaping status: Never Used  Substance and Sexual Activity   Alcohol use: Yes    Alcohol/week: 2.0 standard drinks of alcohol    Types: 2 Cans of beer per week    Comment: occasionally    Drug use: No   Sexual activity: Not Currently    Birth control/protection: Post-menopausal  Other Topics Concern   Not on file  Social History Narrative   Not on file   Social Determinants of Health   Financial Resource Strain: Low Risk  (10/23/2022)   Received from [facility], [facility]   Overall Financial Resource Strain (CARDIA)    Difficulty of Paying Living Expenses: Not hard at all  Food Insecurity: No Food Insecurity (10/23/2022)   Received from [facility], [facility]   Hunger Vital Sign    Worried About Running Out of Food in the Last Year: Never true    Ran Out of Food in the Last Year: Never true  Transportation Needs: No Transportation Needs (10/23/2022)   Received from [facility], [facility]   [facility] - Transportation    In the past 12 months, has lack of transportation kept you from medical appointments or from  getting medications?: No    Lack of Transportation (Non-Medical): No  Physical Activity: Insufficiently Active (05/11/2020)   Received from [facility], [facility]   Exercise Vital Sign    Days of Exercise per Week: 3 days    Minutes of Exercise per Session: 30 min  Stress: Stress Concern Present (05/17/2020)   Received from [facility], [facility]   [employer] of Occupational Health - Occupational Stress Questionnaire    Feeling of Stress : To some extent  Social Connections: Socially Isolated (05/18/2020)   Received from  [facility], [facility]   Social Connection and Isolation Panel [NHANES]    Frequency of Communication with Friends and Family: Twice a week    Frequency of Social Gatherings with Friends and Family: Never    Attends Religious Services: 1 to 4 times per year    Active Member of [employer] or Organizations: No    Attends Banker Meetings: Never    Marital Status: Widowed  Catering manager Violence: Not on file    Family History: Family History  Problem Relation Age of Onset   Hypertension Mother    Cancer Mother 73       colon ca   Heart attack Mother    Hypertension Father    Heart attack Father    Hypertension Sister    Heart attack Sister    Multiple sclerosis Daughter    Multiple sclerosis Son    Multiple sclerosis Son     Review of Systems:  Review of Systems - General ROS: Negative Psychological ROS: Negative Ophthalmic ROS: Negative ENT ROS: Negative Hematological and Lymphatic ROS: Negative  Endocrine ROS: Negative Respiratory ROS: Negative Cardiovascular ROS: Negative Gastrointestinal ROS: Negative Genito-Urinary ROS: Negative Musculoskeletal ROS: Negative Neurological ROS: Negative Dermatological ROS: Negative  Physical Exam: BP (!) 137/56 (BP Location: Right Arm)   Pulse 64   Temp 98.7 F (37.1 C) (Oral)   Resp 18   Ht 5\' 6"  (1.676 m)   Wt 70.8 kg   SpO2 97%   BMI 25.18 kg/m  Body mass index is 25.18 kg/m. Body surface area is 1.82 meters squared. General appearance: Alert, cooperative, in no acute distress Head: Normocephalic, atraumatic Eyes: Normal, EOM intact Oropharynx: Moist without lesions Neck: TTP on midline around C6 Heart: Normal, regular rate and rhythm Lungs: good air exchange Abdomen: Soft, nondistended Ext: No edema in LE bilaterally, good distal pulses  Neurologic exam:  Mental status: alertness: alert, orientation: person, place, time, affect: normal Speech: fluent and  clear Cranial nerves:  CN II-XII grossly intact Motor:strength symmetric 5/5, normal muscle mass and tone in all extremities and no pronator drift Sensory: intact to light touch in all extremities Reflexes: 2+ and symmetric bilaterally for arms and legs Coordination: intact finger to nose Gait: untested  Laboratory: Results for orders placed or performed during the hospital encounter of 02/13/23  CBC with Differential  Result Value Ref Range   WBC 11.9 (H) 4.0 - 10.5 K/uL   RBC 4.48 3.87 - 5.11 MIL/uL   Hemoglobin 14.2 12.0 - 15.0 g/dL   HCT 09.8 11.9 - 14.7 %   MCV 97.5 80.0 - 100.0 fL   MCH 31.7 26.0 - 34.0 pg   MCHC 32.5 30.0 - 36.0 g/dL   RDW 82.9 56.2 - 13.0 %   Platelets 242 150 - 400 K/uL   nRBC 0.0 0.0 - 0.2 %   Neutrophils Relative %  87 %   Neutro Abs 10.3 (H) 1.7 - 7.7 K/uL   Lymphocytes Relative 7 %   Lymphs Abs 0.8 0.7 - 4.0 K/uL   Monocytes Relative 6 %   Monocytes Absolute 0.7 0.1 - 1.0 K/uL   Eosinophils Relative 0 %   Eosinophils Absolute 0.0 0.0 - 0.5 K/uL   Basophils Relative 0 %   Basophils Absolute 0.0 0.0 - 0.1 K/uL   Immature Granulocytes 0 %   Abs Immature Granulocytes 0.05 0.00 - 0.07 K/uL  Basic metabolic panel  Result Value Ref Range   Sodium 135 135 - 145 mmol/L   Potassium 4.2 3.5 - 5.1 mmol/L   Chloride 98 98 - 111 mmol/L   CO2 25 22 - 32 mmol/L   Glucose, Bld 112 (H) 70 - 99 mg/dL   BUN 14 8 - 23 mg/dL   Creatinine, Ser 7.82 0.44 - 1.00 mg/dL   Calcium 9.2 8.9 - 95.6 mg/dL   GFR, Estimated >21 >30 mL/min   Anion gap 12 5 - 15  Basic metabolic panel  Result Value Ref Range   Sodium 139 135 - 145 mmol/L   Potassium 3.9 3.5 - 5.1 mmol/L   Chloride 103 98 - 111 mmol/L   CO2 29 22 - 32 mmol/L   Glucose, Bld 105 (H) 70 - 99 mg/dL   BUN 14 8 - 23 mg/dL   Creatinine, Ser 8.65 0.44 - 1.00 mg/dL   Calcium 8.6 (L) 8.9 - 10.3 mg/dL   GFR, Estimated >78 >46 mL/min   Anion gap 7 5 - 15  CBC  Result Value Ref Range   WBC 6.5 4.0 - 10.5 K/uL    RBC 4.18 3.87 - 5.11 MIL/uL   Hemoglobin 13.2 12.0 - 15.0 g/dL   HCT 96.2 95.2 - 84.1 %   MCV 94.0 80.0 - 100.0 fL   MCH 31.6 26.0 - 34.0 pg   MCHC 33.6 30.0 - 36.0 g/dL   RDW 32.4 40.1 - 02.7 %   Platelets 227 150 - 400 K/uL   nRBC 0.0 0.0 - 0.2 %    Imaging:  I personally reviewed radiology studies to include:  02/13/23 CT C spine  IMPRESSION: 1. Soft tissue hematoma along the posterior scalp. No evidence of an underlying calvarial fracture or intracranial injury. 2. Acute nondisplaced fractures through the inferior articular process of C6 bilaterally. Acute nondisplaced fracture through the left lamina at C6. 3. Possible nondisplaced fractures through the T1 spinous process. 4. Acute nondisplaced fractures through the right first rib. Acute fractures of the posterior left fourth-sixth ribs. Small amount of extrapleural blood products adjacent to the rib fractures. No pneumothorax. 5. Acute fractures through the right L2, L3, and L4 transverse processes. 6. 1.5 cm left thyroid nodule. Consider further evaluation with a dedicated thyroid ultrasound if consistent with patient's goals of care.     Electronically Signed   By: Lorenza Cambridge M.D.   On: 02/13/2023 16:42   02/13/23 CT head IMPRESSION: 1. Soft tissue hematoma along the posterior scalp. No evidence of an underlying calvarial fracture or intracranial injury. 2. Acute nondisplaced fractures through the inferior articular process of C6 bilaterally. Acute nondisplaced fracture through the left lamina at C6. 3. Possible nondisplaced fractures through the T1 spinous process. 4. Acute nondisplaced fractures through the right first rib. Acute fractures of the posterior left fourth-sixth ribs. Small amount of extrapleural blood products adjacent to the rib fractures. No pneumothorax. 5. Acute fractures through the right L2, L3, and  L4 transverse processes. 6. 1.5 cm left thyroid nodule. Consider further  evaluation with a dedicated thyroid ultrasound if consistent with patient's goals of care.     Electronically Signed   By: Lorenza Cambridge M.D.   On: 02/13/2023 16:42  02/13/23 CT lumbar spine IMPRESSION: 1. Soft tissue hematoma along the posterior scalp. No evidence of an underlying calvarial fracture or intracranial injury. 2. Acute nondisplaced fractures through the inferior articular process of C6 bilaterally. Acute nondisplaced fracture through the left lamina at C6. 3. Possible nondisplaced fractures through the T1 spinous process. 4. Acute nondisplaced fractures through the right first rib. Acute fractures of the posterior left fourth-sixth ribs. Small amount of extrapleural blood products adjacent to the rib fractures. No pneumothorax. 5. Acute fractures through the right L2, L3, and L4 transverse processes. 6. 1.5 cm left thyroid nodule. Consider further evaluation with a dedicated thyroid ultrasound if consistent with patient's goals of care.     Electronically Signed   By: Lorenza Cambridge M.D.   On: 02/13/2023 16:42  02/13/23 CT T spine IMPRESSION: 1. Soft tissue hematoma along the posterior scalp. No evidence of an underlying calvarial fracture or intracranial injury. 2. Acute nondisplaced fractures through the inferior articular process of C6 bilaterally. Acute nondisplaced fracture through the left lamina at C6. 3. Possible nondisplaced fractures through the T1 spinous process. 4. Acute nondisplaced fractures through the right first rib. Acute fractures of the posterior left fourth-sixth ribs. Small amount of extrapleural blood products adjacent to the rib fractures. No pneumothorax. 5. Acute fractures through the right L2, L3, and L4 transverse processes. 6. 1.5 cm left thyroid nodule. Consider further evaluation with a dedicated thyroid ultrasound if consistent with patient's goals of care.     Electronically Signed   By: Lorenza Cambridge M.D.   On:  02/13/2023 16:42   Impression/Plan:   C6 fractures: Cervical collar ordered and at patients bedside. OK to work with therapy. Ok to remove collar when in bed and for meals.  Will follow-up in clinic in 2 to 3 weeks with cervical x-rays. Lumbar fractures: Pain control as indicated.  No need for bracing.  Please feel free to contact neurosurgery with any questions or concerns.  Manning Charity PA-C Neurosurgery

## 2023-02-14 NOTE — Evaluation (Signed)
Physical Therapy Evaluation Patient Details Name: Teresa Lin MRN: [id number] DOB: [birth year] Today's Date: 02/14/2023  History of Present Illness  Ms. Teresa Lin is an 89 year old female female with history of seronegative RA, history of DVT, PE, on anticoagulation with Eliquis, who presents emergency department for chief concerns of tripping and falling in her yard. CT imaging reveals:  Acute nondisplaced fractures through the inferior articular  process of C6 bilaterally. Acute nondisplaced fracture through the  left lamina at C6.  3. Possible nondisplaced fractures through the T1 spinous process.  4. Acute nondisplaced fractures through the right first rib. Acute  fractures of the posterior left fourth-sixth ribs. Small amount of  extrapleural blood products adjacent to the rib fractures. No  pneumothorax.  5. Acute fractures through the right L2, L3, and L4 transverse  processes.   Clinical Impression  Patient received in bed resting. She is agreeable to PT assessment. Patient requires cues for safe bed mobility and donning cervical brace. She performed transfer with supervision/cga. Patient ambulated 200 feet with RW and supervision. No lob, but veering left and right. Patient will continue to benefit from skilled PT to educate on spinal precautions, safe use of AD, and overall safety with mobility.             If plan is discharge home, recommend the following: A little help with walking and/or transfers;A little help with bathing/dressing/bathroom;Assist for transportation;Help with stairs or ramp for entrance;Assistance with cooking/housework   Can travel by private vehicle    yes    Equipment Recommendations Rolling walker (2 wheels)  Recommendations for Other Services       Functional Status Assessment Patient has had a recent decline in their functional status and demonstrates the ability to make significant improvements in function in a reasonable and predictable amount of time.      Precautions / Restrictions Precautions Precautions: Cervical;Back;Fall Precaution Comments: Has a [place] J Hard collar donned EOB Required Braces or Orthoses: Cervical Brace Cervical Brace: Hard collar (when out of bed) Restrictions Weight Bearing Restrictions: No      Mobility  Bed Mobility Overal bed mobility: Needs Assistance Bed Mobility: Supine to Sit, Sit to Supine     Supine to sit: Contact guard, HOB elevated Sit to supine: Contact guard assist, HOB elevated   General bed mobility comments: Cues for log rolling HOB elevated    Transfers Overall transfer level: Needs assistance Equipment used: Rolling walker (2 wheels) Transfers: Sit to/from Stand                  Ambulation/Gait Ambulation/Gait assistance: Contact guard assist Gait Distance (Feet): 200 Feet Assistive device: Rolling walker (2 wheels) Gait Pattern/deviations: Step-through pattern, Drifts right/left Gait velocity: WNL     General Gait Details: patient [place] supervision to cga for ambulation in halls, no lob  Stairs            Wheelchair Mobility     Tilt Bed    Modified Rankin (Stroke Patients Only)       Balance Overall balance assessment: Needs assistance, History of Falls Sitting-balance support: Feet supported Sitting balance-Leahy Scale: Good     Standing balance support: Bilateral upper extremity supported, During functional activity, Reliant on assistive device for balance Standing balance-Leahy Scale: Good                               Pertinent Vitals/Pain Pain Assessment Pain Assessment: Faces Faces  Pain Scale: Hurts little more Pain Location: neck, back, left side Pain Descriptors / Indicators: Discomfort, Grimacing, Sore Pain Intervention(s): Monitored during session, Repositioned    Home Living Family/patient expects to be discharged to:: Private residence Living Arrangements: Alone Available Help at Discharge: Family;Available  PRN/intermittently Type of Home: House Home Access: Stairs to enter;Ramped entrance Entrance Stairs-Rails: None Entrance Stairs-Number of Steps: 1 step to get to the ramp Alternate Level Stairs-Number of Steps: flight Home Layout: Able to live on main level with bedroom/bathroom;Multi-level Home Equipment: Shower seat;Grab bars - tub/shower;Cane - single point Additional Comments: Pt reports use of cane only when needed, about "once every 3-4 months."    Prior Function Prior Level of Function : History of Falls (last six months);Independent/Modified Independent             Mobility Comments: pt drives and able to navigate in the community independently. ADLs Comments: Pt reports being independent on all ADL's and IADL's.     Extremity/Trunk Assessment   Upper Extremity Assessment Upper Extremity Assessment: Generalized weakness    Lower Extremity Assessment Lower Extremity Assessment: Generalized weakness    Cervical / Trunk Assessment Cervical / Trunk Assessment:  (spinal fractures)  Communication   Communication Communication: No apparent difficulties Cueing Techniques: Verbal cues;Gestural cues  Cognition Arousal: Alert Behavior During Therapy: WFL for tasks assessed/performed Overall Cognitive Status: Within Functional Limits for tasks assessed                                          General Comments      Exercises     Assessment/Plan    PT Assessment Patient needs continued PT services  PT Problem List Decreased strength;Decreased activity tolerance;Decreased balance;Decreased mobility;Decreased knowledge of use of DME;Decreased knowledge of precautions;Pain       PT Treatment Interventions DME instruction;Gait training;Stair training;Functional mobility training;Therapeutic activities;Therapeutic exercise;Balance training;Neuromuscular re-education;Patient/family education    PT Goals (Current goals can be found in the Care Plan  section)  Acute Rehab PT Goals Patient Stated Goal: to return home PT Goal Formulation: With patient Time For Goal Achievement: 02/28/23 Potential to Achieve Goals: Good    Frequency Min 1X/week     Co-evaluation               AM-PAC PT "6 Clicks" Mobility  Outcome Measure Help needed turning from your back to your side while in a flat bed without using bedrails?: A Little Help needed moving from lying on your back to sitting on the side of a flat bed without using bedrails?: A Little Help needed moving to and from a bed to a chair (including a wheelchair)?: A Little Help needed standing up from a chair using your arms (e.g., wheelchair or bedside chair)?: A Little Help needed to walk in hospital room?: A Little Help needed climbing 3-5 steps with a railing? : A Little 6 Click Score: 18    End of Session Equipment Utilized During Treatment: Cervical collar Activity Tolerance: Patient tolerated treatment well Patient left: in bed;with call bell/phone within reach;with bed alarm set Nurse Communication: Mobility status PT Visit Diagnosis: Unsteadiness on feet (R26.81);Other abnormalities of gait and mobility (R26.89);Muscle weakness (generalized) (M62.81);History of falling (Z91.81);Difficulty in walking, not elsewhere classified (R26.2);Pain Pain - Right/Left: Right Pain - part of body:  (spinal and ribs)    Time: 1342-1350 PT Time Calculation (min) (ACUTE ONLY): 8 min  Charges:   PT Evaluation $PT Eval Moderate Complexity: 1 Mod   PT General Charges $$ ACUTE PT VISIT: 1 Visit         Maki Sweetser, PT, GCS 02/14/23,2:03 PM

## 2023-02-14 NOTE — Assessment & Plan Note (Signed)
No acute issues.

## 2023-02-14 NOTE — Evaluation (Signed)
Occupational Therapy Evaluation Patient Details Name: Teresa Lin MRN: [id number] DOB: [birth year] Today's Date: 02/14/2023   History of Present Illness Ms. Teresa Lin is an 89 year old female female with history of seronegative RA, history of DVT, PE, on anticoagulation with Eliquis, who presents emergency department for chief concerns of tripping and falling in her yard.   Clinical Impression   Pt was seen for OT evaluation this date. Prior to hospital admission, pt was independent with all I/ADL's. Pt lives alone and has a history of falls. Pt presents to acute OT demonstrating impaired ADL performance and functional mobility (See OT problem list for additional functional deficits). Upon entering Pt supine in bed. Pt educated and completed log roll supine>sit at EOB to minimize pain with Min A. Pt reported feeling discomfort in her shoulder and back. Pt ambulated from EOB to bathroom with supervision. Pt completed toilet t/f, toilet management and hygiene with supervision. Pt ambulated with supervision one lap around the nurses station and to room. Pt completed sit>supine transfer with supervision. While in bed Pt vomited in emesis bag. Pt finished and left supine with HOB elevated with call bell within reach and bed alarm set. Pt would benefit from skilled OT services to address noted impairments and functional limitations (see below for any additional details) in order to maximize safety and independence while minimizing falls risk. Pt has requested Rehab services upon acute hospital DC.        If plan is discharge home, recommend the following: Assist for transportation;A little help with bathing/dressing/bathroom;A little help with walking and/or transfers;Assistance with cooking/housework    Functional Status Assessment  Patient has had a recent decline in their functional status and demonstrates the ability to make significant improvements in function in a reasonable and predictable amount of  time.  Equipment Recommendations  None recommended by OT    Recommendations for Other Services       Precautions / Restrictions Precautions Precautions: Fall;Back;Cervical Precaution Comments: Has a [place] Hard collar donn EOB      Mobility Bed Mobility   Bed Mobility: Supine to Sit     Supine to sit: Contact guard     General bed mobility comments: Pt was able to log roll in bed and sit up at EOB with the [facility] a little elevated.    Transfers Overall transfer level: Independent Equipment used: None                      Balance Overall balance assessment: Needs assistance, History of Falls Sitting-balance support: Feet supported Sitting balance-Leahy Scale: Good     Standing balance support: No upper extremity supported, During functional activity Standing balance-Leahy Scale: Good                             ADL either performed or assessed with clinical judgement   ADL Overall ADL's : Needs assistance/impaired                         Toilet Transfer: Supervision/safety;Regular Toilet;Grab bars   Toileting- Clothing Manipulation and Hygiene: Supervision/safety;Sit to/from stand       Functional mobility during ADLs: Supervision/safety       Vision         Perception         Praxis         Pertinent Vitals/Pain Pain Assessment Pain Assessment: No/denies pain  Extremity/Trunk Assessment Upper Extremity Assessment Upper Extremity Assessment: Generalized weakness   Lower Extremity Assessment Lower Extremity Assessment: Overall WFL for tasks assessed       Communication Communication Communication: No apparent difficulties   Cognition Arousal: Alert Behavior During Therapy: WFL for tasks assessed/performed Overall Cognitive Status: Within Functional Limits for tasks assessed                                       General Comments       Exercises     Shoulder Instructions       Home Living Family/patient expects to be discharged to:: Private residence Living Arrangements: Alone Available Help at Discharge: Other (Comment) (Someone comes to clean her home every other week) Type of Home: House Home Access: Stairs to enter;Ramped entrance Entrance Stairs-Number of Steps: 1 step to get to the ramp Entrance Stairs-Rails: None Home Layout: Able to live on main level with bedroom/bathroom;Multi-level     Bathroom Shower/Tub: Tub/shower unit;Walk-in shower   Bathroom Toilet: Handicapped height Bathroom Accessibility: Yes   Home Equipment: Shower seat;Grab bars - tub/shower;Cane - single point   Additional Comments: Pt reports use of cane only when needed, about "once every 3-4 months."      Prior Functioning/Environment Prior Level of Function : History of Falls (last six months);Independent/Modified Independent             Mobility Comments: pt drives and able to navigate in the community independently. ADLs Comments: Pt reports being independent on all ADL's and IADL's.        OT Problem List: Decreased strength;Decreased activity tolerance;Decreased safety awareness;Decreased knowledge of precautions      OT Treatment/Interventions: Self-care/ADL training;Energy conservation    OT Goals(Current goals can be found in the care plan section) Acute Rehab OT Goals Patient Stated Goal: to go home OT Goal Formulation: With patient Time For Goal Achievement: 02/28/23 Potential to Achieve Goals: Good  OT Frequency: Min 1X/week    Co-evaluation              AM-PAC OT "6 Clicks" Daily Activity     Outcome Measure Help from another person eating meals?: None Help from another person taking care of personal grooming?: A Little Help from another person toileting, which includes using toliet, bedpan, or urinal?: A Little Help from another person bathing (including washing, rinsing, drying)?: A Little Help from another person to put on and taking  off regular upper body clothing?: A Little Help from another person to put on and taking off regular lower body clothing?: A Lot 6 Click Score: 18   End of Session Equipment Utilized During Treatment: Cervical collar  Activity Tolerance: Patient tolerated treatment well Patient left: in bed;with call bell/phone within reach;with bed alarm set  OT Visit Diagnosis: History of falling (Z91.81);Muscle weakness (generalized) (M62.81)                Time: 3086-5784 OT Time Calculation (min): 34 min Charges:  OT General Charges $OT Visit: 1 Visit OT Evaluation $OT Eval Moderate Complexity: 1 Mod OT Treatments $Self Care/Home Management : 8-22 mins $Therapeutic Activity: 8-22 mins  Butch Penny, SOT

## 2023-02-14 NOTE — Progress Notes (Addendum)
Progress Note   Patient: Teresa Lin:[id number] DOB: 06-18-1931 DOA: 02/13/2023     1 DOS: the patient was seen and examined on 02/14/2023   Brief hospital course: Ms. Teresa Lin is an 89 year old female female with history of seronegative RA, history of DVT, PE, on anticoagulation with Eliquis, who presents emergency department for chief concerns of tripping and falling in her yard.  Vitals in the ED showed temperature 98, respiration rate 18, heart rate of 70, blood pressure 170/60, SpO2 98% on room air.  Serum sodium serum 0 135, potassium 4.2, chloride 98, bicarb 25, nonfasting blood glucose 112, BUN of 14, serum creatinine 0.44, eGFR > 60.  WBC 11.9, hemoglobin 14.2, platelets of 242.  CT head, cervical spine, thoracic, lumbar without contrast were ordered and has been read per radiology.  Tib-fib left XR read as no fracture of the left tibia or fibula.  Left shoulder XR: No fracture or dislocation of the left shoulder.  ED treatment: Lidocaine patch 5% one-time dose ordered.  Assessment and Plan: * At risk for falling Family report patient has had 13 falls, 4 of the last 5 falls have resulted in fractures.  Pt lives alone at home independent.  Given that she has fractured vertebrae including cervical, thoracic, lumbar and ribs, patient will have limited ability to perform her activities of daily living. --PT, OT, TOC consulted --Symptomatic support --Fall precautions  History of pulmonary embolism On Eliquis  Rheumatoid arthritis of multiple sites without rheumatoid factor (HCC) .  Leukocytosis Suspect reactive in setting of multiple rib fracture and spinal fractures.  CXR no pneumonia.   --UA pending - follow results --Monitor CBC --No indication for antibiotics at this time --Monitor clinically for s/sx's of infection  Lumbar transverse process fracture (HCC) Right L2, L3, L4 transverse processes fractures Per Neurosurgery - no need for bracing. Pain control --  scheduled Tylenol PRN Tramadol, PRN IV morphine for severe pain  Rib fractures Acute nondisplaced fractures right first rib Acute fractures of the posterior left 4th-6th ribs Small amount of extrapleural blood products adjacent to the rib fractures, no pneumothorax --Incentive spirometry, flutter valve --Pain control per orders  T1 vertebral fracture (HCC) No bracing needed per Neurosurgery Pain control per orders PT/OT  Fracture of cervical vertebra, C6 (HCC) Acute nondisplaced, through the inferior articular process of C6 bilaterally Acute nondisplaced fracture through the left lamina at C6 --Neurosurgery consulted -- appreciate input --Soft C collar (can be removed when in bed and for meals) --PT/OT --Follow up with Neurosurgery in 2-3 weeks for repeat xrays  History of DVT (deep vein thrombosis) On Eliquis  S/P right colectomy No acute issues  Frequent falls .  Seronegative rheumatoid arthritis affecting lower leg (HCC) .  Insomnia Melatonin PRN        Subjective: Pt seen with daughter-in-law at bedside today.  She reports pain overall controlled with Tylenol. Agrees to have it scheduled to hopefully prevent spikes in pain.  She is very reluctant to use opiates, agreed to change oxycodone to tramadol in case of need for stronger med.  She is agreeable to SNF.    Physical Exam: Vitals:   02/13/23 1956 02/14/23 0514 02/14/23 0756 02/14/23 1553  BP: (!) 149/50 (!) 144/63 (!) 137/56 107/83  Pulse: 73 69 64 74  Resp: 16 16 18 19   Temp: 98.4 F (36.9 C) 97.9 F (36.6 C) 98.7 F (37.1 C) 98.2 F (36.8 C)  TempSrc:   Oral Oral  SpO2: 97% 97% 97% 92%  Weight:      Height:       General exam: awake, alert, no acute distress HEENT: small abrasion and ecchymosis of right cheekbone, moist mucus membranes, hearing grossly normal  Respiratory system: CTAB, no wheezes, rales or rhonchi, normal respiratory effort. Cardiovascular system: normal S1/S2, RRR, no JVD,  murmurs, rubs, gallops, no pedal edema.   Gastrointestinal system: soft, NT, ND, no HSM felt, +bowel sounds. Central nervous system: A&O x 4. no gross focal neurologic deficits, normal speech Extremities: moves all, no edema, normal tone Skin: dry, intact, normal temperature, normal color, No rashes, lesions or ulcers Psychiatry: normal mood, congruent affect, judgement and insight appear normal   Data Reviewed:  Notable labs ---   glucose 105, Ca 8.6 otherwise normal BMP.. Normal CBC  Family Communication: Daughter-in-law at bedside.  Son Teresa Lin updated by phone.  Disposition: Status is: Observation The patient remains OBS appropriate and will d/c before 2 midnights. Unsafe discharge home as pt will need 24/7 support at home.  Pt needs SNF placement.   Planned Discharge Destination: Skilled nursing facility    Time spent: 45 minutes  Author: Pennie Banter, DO 02/14/2023 7:03 PM  For on call review [email or web address].

## 2023-02-14 NOTE — Care Management CC44 (Signed)
Condition Code 44 Documentation Completed  Patient Details  Name: PANTERA WINTERROWD MRN: [id number] Date of Birth: [birth year]   Condition Code 44 given:  Yes Patient signature on Condition Code 44 notice:  Yes Documentation of 2 MD's agreement:  Yes Code 44 added to claim:  Yes    Allena Katz, LCSW 02/14/2023, 2:06 PM

## 2023-02-15 DIAGNOSIS — Z9181 History of falling: Secondary | ICD-10-CM | POA: Diagnosis not present

## 2023-02-15 LAB — CBC
HCT: 41.2 % (ref 36.0–46.0)
Hemoglobin: 13.8 g/dL (ref 12.0–15.0)
MCH: 32.1 pg (ref 26.0–34.0)
MCHC: 33.5 g/dL (ref 30.0–36.0)
MCV: 95.8 fL (ref 80.0–100.0)
Platelets: 239 10*3/uL (ref 150–400)
RBC: 4.3 MIL/uL (ref 3.87–5.11)
RDW: 13.7 % (ref 11.5–15.5)
WBC: 7.1 10*3/uL (ref 4.0–10.5)
nRBC: 0 % (ref 0.0–0.2)

## 2023-02-15 LAB — BASIC METABOLIC PANEL
Anion gap: 9 (ref 5–15)
BUN: 13 mg/dL (ref 8–23)
CO2: 28 mmol/L (ref 22–32)
Calcium: 8.7 mg/dL — ABNORMAL LOW (ref 8.9–10.3)
Chloride: 101 mmol/L (ref 98–111)
Creatinine, Ser: 0.53 mg/dL (ref 0.44–1.00)
GFR, Estimated: >60 mL/min (ref >60)
Glucose, Bld: 98 mg/dL (ref 70–99)
Potassium: 3.6 mmol/L (ref 3.5–5.1)
Sodium: 138 mmol/L (ref 135–145)

## 2023-02-15 LAB — PHOSPHORUS: Phosphorus: 3.3 mg/dL (ref 2.5–4.6)

## 2023-02-15 LAB — MAGNESIUM: Magnesium: 2 mg/dL (ref 1.7–2.4)

## 2023-02-15 NOTE — TOC Progression Note (Signed)
Transition of Care [facility]) - Progression Note    Patient Details  Name: Teresa Lin MRN: [id number] Date of Birth: 27-Jul-1931  Transition of Care [facility]) CM/SW Contact  Susa Simmonds, [place] Phone Number: 02/15/2023, 4:43 PM  Clinical Narrative:   CSW updated patients son Zenaida Niece about the home health recommendations. Patients son stated he doesn't need to hear all that and was told patient was going to a SNF and referrals were sent to [facility] and [facility] yesterday. Patients son told CSW she could record him and that patient has fallen several times and going home is not an option. Patients son stated patient will need rehab. Patients son wants [facility]. [facility] is not able to accommodate patient. [facility] will review tomorrow.          Expected Discharge Plan and Services                                               Social Determinants of Health (SDOH) Interventions SDOH Screenings   Food Insecurity: No Food Insecurity (02/15/2023)  Housing: Low Risk  (02/15/2023)  Transportation Needs: No Transportation Needs (02/15/2023)  Utilities: Not At Risk (02/15/2023)  Financial Resource Strain: Low Risk  (10/23/2022)   Received from [facility], [facility]  Physical Activity: Insufficiently Active (05/11/2020)   Received from [facility], [facility]  Social Connections: Socially Isolated (05/18/2020)   Received from [facility], [facility]  Stress: Stress Concern Present (05/17/2020)   Received from [facility], [facility]  Tobacco Use: Low Risk  (02/13/2023)    Readmission Risk Interventions     No data to display

## 2023-02-15 NOTE — Progress Notes (Addendum)
Progress Note   Patient: Teresa Lin ZOX:[id number] DOB: November 11, 1931 DOA: 02/13/2023     1 DOS: the patient was seen and examined on 02/15/2023   Brief hospital course: Ms. Teresa Lin is an 89 year old female female with history of seronegative RA, history of DVT, PE, on anticoagulation with Eliquis, who presents emergency department for chief concerns of tripping and falling in her yard.   See H&P for full HPI on admission.  In summary, patient was found to have multiple fractures including C6, T1, Right 1st rib, Left posterior ribs 4-6, and L2-L4 transverse processes.  Pt admitted for close monitoring and further management.  PT/OT recommend SNF placement for rehab which is appropriate given patients frequent fall results in multiple fractures, lack of 24/7 assistance in the home where she lives alone.  Further hospital course and management as outlined below.    Assessment and Plan: * At risk for falling Family report patient has had 13 falls, 4 of the last 5 falls have resulted in fractures.  Pt lives alone at home independent.  Given that she has fractured vertebrae including cervical, thoracic, lumbar and ribs, patient will have limited ability to perform her activities of daily living. --PT, OT, TOC consulted --Symptomatic support --Fall precautions  History of pulmonary embolism On Eliquis  Rheumatoid arthritis of multiple sites without rheumatoid factor (HCC) .  Leukocytosis Suspect reactive in setting of multiple rib fracture and spinal fractures.  CXR no pneumonia.   --UA pending - follow results --Monitor CBC --No indication for antibiotics at this time --Monitor clinically for s/sx's of infection  Lumbar transverse process fracture (HCC) Right L2, L3, L4 transverse processes fractures Per Neurosurgery - no need for bracing. Pain control -- scheduled Tylenol PRN Tramadol, PRN IV morphine for severe pain  Rib fractures Acute nondisplaced fractures right first  rib Acute fractures of the posterior left 4th-6th ribs Small amount of extrapleural blood products adjacent to the rib fractures, no pneumothorax --Incentive spirometry, flutter valve --Pain control per orders  T1 vertebral fracture (HCC) No bracing needed per Neurosurgery Pain control per orders PT/OT  Fracture of cervical vertebra, C6 (HCC) Acute nondisplaced, through the inferior articular process of C6 bilaterally Acute nondisplaced fracture through the left lamina at C6 --Neurosurgery consulted -- appreciate input --Soft C collar (can be removed when in bed and for meals) --PT/OT --Follow up with Neurosurgery in 2-3 weeks for repeat xrays  History of DVT (deep vein thrombosis) On Eliquis  S/P right colectomy No acute issues  Frequent falls .  Seronegative rheumatoid arthritis affecting lower leg (HCC) .  Insomnia Melatonin PRN        Subjective: Pt seen ambulating with walker from bathroom to recliner chair, appears steady.  She reports pain controlled on just Tylenol.  Denies any other complaints.  Wearing neck brace since out of bed.  Physical Exam: Vitals:   02/14/23 1947 02/14/23 2347 02/15/23 0454 02/15/23 0801  BP: 132/69 (!) 151/68 (!) 163/57 (!) 121/58  Pulse: 68 78 69 72  Resp: 16 20 16    Temp: 98.1 F (36.7 C) 98.1 F (36.7 C) 98 F (36.7 C)   TempSrc:      SpO2: 97% 96% 96%   Weight:      Height:       General exam: awake, alert, no acute distress HEENT: moist mucus membranes, hearing grossly normal  Respiratory system: on room air with normal respiratory effort. Cardiovascular system: RRR, no pedal edema.   Gastrointestinal system: soft, NT, ND  Central nervous system: A&O x 4. no gross focal neurologic deficits, normal speech Extremities: moves all, no edema, normal tone Skin: dry, intact, normal temperature, normal color, No rashes, lesions or ulcers Psychiatry: normal mood, congruent affect, judgement and insight appear  normal   Data Reviewed:  Notable labs ---   normal BMP except Ca 8.7. Normal CBC  Family Communication: Daughter-in-law at bedside 10/4.  Son Zenaida Niece updated by phone 10/4, 10/5.   Disposition: Status is: Observation The patient remains OBS appropriate and will d/c before 2 midnights.  Unsafe discharge home as pt will need 24/7 support at home.  Pt needs SNF placement.    Planned Discharge Destination: Skilled nursing facility    Time spent: 35 minutes  Author: Pennie Banter, DO 02/15/2023 2:35 PM  For on call review [email or web address].

## 2023-02-15 NOTE — NC FL2 (Signed)
[place] MEDICAID FL2 LEVEL OF CARE FORM     IDENTIFICATION  Patient Name: Teresa Lin Birthdate: 1931/08/31 Sex: female Admission Date (Current Location): 02/13/2023  [facility] and [place] Number:  Chiropodist and Address:  [facility], [address], [place], [place] [address]      Provider Number: [id number]  Attending Physician Name and Address:  Pennie Banter, DO  Relative Name and Phone Number:  [name], [name] (Son)  [phone] [facility])    Current Level of Care: Hospital Recommended Level of Care: Skilled Nursing Facility Prior Approval Number:    Date Approved/Denied:   PASRR Number: [id number]  Discharge Plan: SNF    Current Diagnoses: Patient Active Problem List   Diagnosis Date Noted   At risk for falling 02/13/2023   Fracture of cervical vertebra, C6 (HCC) 02/13/2023   T1 vertebral fracture (HCC) 02/13/2023   Rib fractures 02/13/2023   Lumbar transverse process fracture (HCC) 02/13/2023   Leukocytosis 02/13/2023   Normocytic anemia 08/11/2021   Hypokalemia 08/09/2021   Displaced comminuted supracondylar fracture of right humerus without intercondylar fracture 08/08/2021   Open fracture of right humerus 08/08/2021   History of DVT (deep vein thrombosis) 06/06/2021   Rheumatoid arthritis of multiple sites without rheumatoid factor (HCC) 04/24/2021   History of pulmonary embolism 02/04/2020   History of blood clotting disorder 01/12/2020   Immunosuppression (HCC) 01/25/2019   Status post reverse total shoulder replacement, right 09/24/2018   Hyperglycemia 08/31/2018   S/P right colectomy 01/26/2018   Adenomatous polyp of ascending colon 11/24/2017   Generalized anxiety disorder 09/06/2017   Irritable bowel syndrome (IBS) 09/06/2017   Orthostasis 06/05/2016   Left shoulder pain 10/13/2015   Constipation 05/30/2015   Osteoporosis, postmenopausal 05/30/2015   Compression fracture of L2 vertebra with delayed healing  05/29/2015   Frequent falls 03/26/2015   Atherosclerosis of arteries 03/26/2015   Family history of colon cancer 09/28/2014   TMJ (temporomandibular joint syndrome) 06/12/2014   Varicose veins of both lower extremities without ulcer or inflammation 06/12/2014   B12 deficiency 09/20/2013   Fatigue 05/23/2013   Internal hemorrhoids without complication 04/20/2013   Insomnia 03/03/2013   Vertigo due to cerebrovascular disease 03/03/2013   Seronegative rheumatoid arthritis affecting lower leg (HCC) 03/03/2013    Orientation RESPIRATION BLADDER Height & Weight     Self, Time, Situation, Place  Normal Continent Weight: 156 lb (70.8 kg) Height:  5\' 6"  (167.6 cm)  BEHAVIORAL SYMPTOMS/MOOD NEUROLOGICAL BOWEL NUTRITION STATUS      Continent Diet (Heart healthy)  AMBULATORY STATUS COMMUNICATION OF NEEDS Skin   Supervision Verbally Normal                       Personal Care Assistance Level of Assistance  Dressing, Feeding, Bathing Bathing Assistance: Limited assistance Feeding assistance: Independent Dressing Assistance: Limited assistance     Functional Limitations Info  Sight, Hearing, Speech Sight Info: Adequate Hearing Info: Adequate Speech Info: Adequate    SPECIAL CARE FACTORS FREQUENCY                       Contractures Contractures Info: Not present    Additional Factors Info  Code Status, Allergies Code Status Info: Full Allergies Info: Infliximab, Lidocaine-menthol,           Current Medications (02/15/2023):  This is the current hospital active medication list Current Facility-Administered Medications  Medication Dose Route Frequency Provider Last Rate Last Admin  acetaminophen (TYLENOL) tablet 1,000 mg  1,000 mg Oral Q8H Griffith, Kelly A, DO   1,000 mg at 02/15/23 1330   apixaban (ELIQUIS) tablet 5 mg  5 mg Oral BID Cox, Amy N, DO   5 mg at 02/15/23 8657   calcium-vitamin D (OSCAL WITH D) 500-5 MG-MCG per tablet 1 tablet  1 tablet Oral Daily  Cox, Amy N, DO   1 tablet at 02/15/23 0935   docusate sodium (COLACE) capsule 100 mg  100 mg Oral Daily Cox, Amy N, DO   100 mg at 02/13/23 2124   donepezil (ARICEPT) tablet 5 mg  5 mg Oral QHS Cox, Amy N, DO   5 mg at 02/14/23 2100   folic acid (FOLVITE) tablet 2 mg  2 mg Oral Daily Cox, Amy N, DO   2 mg at 02/15/23 8469   hydroxychloroquine (PLAQUENIL) tablet 400 mg  400 mg Oral Q M,W,F Angelique Blonder, RPH   400 mg at 02/14/23 1413   And   hydroxychloroquine (PLAQUENIL) tablet 200 mg  200 mg Oral Q T,Th,S,Su Angelique Blonder, RPH   200 mg at 02/15/23 0936   lidocaine (LIDODERM) 5 % 1 patch  1 patch Transdermal Q24H Cox, Amy N, DO   1 patch at 02/14/23 1817   melatonin tablet 5 mg  5 mg Oral QHS PRN Cox, Amy N, DO   5 mg at 02/14/23 2012   [START ON 02/16/2023] methotrexate chemo injection 20 mg  20 mg Subcutaneous Q Sun Cox, Amy N, DO       morphine (PF) 2 MG/ML injection 2 mg  2 mg Intravenous Q4H PRN Esaw Grandchild A, DO       multivitamin with minerals tablet 1 tablet  1 tablet Oral Daily Cox, Amy N, DO   1 tablet at 02/15/23 0936   ondansetron (ZOFRAN) tablet 4 mg  4 mg Oral Q6H PRN Cox, Amy N, DO       Or   ondansetron (ZOFRAN) injection 4 mg  4 mg Intravenous Q6H PRN Cox, Amy N, DO       polyvinyl alcohol (LIQUIFILM TEARS) 1.4 % ophthalmic solution 1 drop  1 drop Both Eyes TID PRN Cox, Amy N, DO       traMADol (ULTRAM) tablet 50-100 mg  50-100 mg Oral Q6H PRN Pennie Banter, DO         Discharge Medications: Please see discharge summary for a list of discharge medications.  Relevant Imaging Results:  Relevant Lab Results:   Additional Information SSN: [id number]  Susa Simmonds, LCSWA

## 2023-02-15 NOTE — Progress Notes (Signed)
Mobility Specialist - Progress Note   02/15/23 0933  Mobility  Activity Ambulated independently in hallway  Level of Assistance Independent  Assistive Device Front wheel walker  Distance Ambulated (ft) 600 ft  Activity Response Tolerated well  Mobility Referral Yes  $Mobility charge 1 Mobility  Mobility Specialist Start Time (ACUTE ONLY) [id number]  Mobility Specialist Stop Time (ACUTE ONLY) 0900  Mobility Specialist Time Calculation (min) (ACUTE ONLY) 18 min   Pt sitting in recliner on RA upon arrival. Pt STS and ambulates in hallway indep with no LOB noted. Pt returns to recliner with needs in reach.   Terrilyn Saver  Mobility Specialist  02/15/23 9:35 AM

## 2023-02-15 NOTE — Plan of Care (Signed)

## 2023-02-16 DIAGNOSIS — Z9181 History of falling: Secondary | ICD-10-CM | POA: Diagnosis not present

## 2023-02-16 MED ORDER — HYDRALAZINE HCL 50 MG PO TABS: 25.0000 mg | ORAL_TABLET | Freq: Four times a day (QID) | ORAL | Status: DC | PRN

## 2023-02-16 NOTE — Progress Notes (Signed)
Mobility Specialist - Progress Note   02/16/23 0958  Mobility  Activity Ambulated with assistance in hallway  Level of Assistance Standby assist, set-up cues, supervision of patient - no hands on  Assistive Device Front wheel walker  Distance Ambulated (ft) 1000 ft  Activity Response Tolerated well  Mobility Referral Yes  $Mobility charge 1 Mobility  Mobility Specialist Start Time (ACUTE ONLY) 0901  Mobility Specialist Stop Time (ACUTE ONLY) [id number]  Mobility Specialist Time Calculation (min) (ACUTE ONLY) 17 min   Pt sitting in recliner on RA upon arrival. Pt STS and ambulates in hallway Supervision with no physical assistance. Pt returns to recliner with needs in reach.  Teresa Lin  Mobility Specialist  02/16/23 10:00 AM

## 2023-02-16 NOTE — Plan of Care (Signed)
  Problem: Health Behavior/Discharge Planning: Goal: Ability to manage health-related needs will improve Outcome: Progressing   Problem: Clinical Measurements: Goal: Will remain free from infection Outcome: Progressing Goal: Diagnostic test results will improve Outcome: Progressing Goal: Respiratory complications will improve Outcome: Progressing   Problem: Activity: Goal: Risk for activity intolerance will decrease Outcome: Progressing   Problem: Nutrition: Goal: Adequate nutrition will be maintained Outcome: Progressing

## 2023-02-16 NOTE — Progress Notes (Signed)
Progress Note   Patient: Teresa Lin:[id number] DOB: 31-Mar-1932 DOA: 02/13/2023     1 DOS: the patient was seen and examined on 02/16/2023   Brief hospital course: Ms. Teresa Lin is an 89 year old female female with history of seronegative RA, history of DVT, PE, on anticoagulation with Eliquis, who presents emergency department for chief concerns of tripping and falling in her yard.   See H&P for full HPI on admission.  In summary, patient was found to have multiple fractures including C6, T1, Right 1st rib, Left posterior ribs 4-6, and L2-L4 transverse processes.  Pt admitted for close monitoring and further management.  PT/OT recommend SNF placement for rehab which is appropriate given patients frequent fall results in multiple fractures, lack of 24/7 assistance in the home where she lives alone.  Further hospital course and management as outlined below.    Assessment and Plan: * At risk for falling Family report patient has had 13 falls, 4 of the last 5 falls have resulted in fractures.  Pt lives alone at home independent.  Given that she has fractured vertebrae including cervical, thoracic, lumbar and ribs, patient will have limited ability to perform her activities of daily living. --PT, OT, TOC consulted --Symptomatic support --Fall precautions  History of pulmonary embolism On Eliquis  Rheumatoid arthritis of multiple sites without rheumatoid factor (HCC) .  Leukocytosis Suspect reactive in setting of multiple rib fracture and spinal fractures.  CXR no pneumonia.   --UA pending - follow results --Monitor CBC --No indication for antibiotics at this time --Monitor clinically for s/sx's of infection  Lumbar transverse process fracture (HCC) Right L2, L3, L4 transverse processes fractures Per Neurosurgery - no need for bracing. Pain control -- scheduled Tylenol PRN Tramadol, PRN IV morphine for severe pain  Rib fractures Acute nondisplaced fractures right first  rib Acute fractures of the posterior left 4th-6th ribs Small amount of extrapleural blood products adjacent to the rib fractures, no pneumothorax --Incentive spirometry, flutter valve --Pain control per orders  T1 vertebral fracture (HCC) No bracing needed per Neurosurgery Pain control per orders PT/OT  Fracture of cervical vertebra, C6 (HCC) Acute nondisplaced, through the inferior articular process of C6 bilaterally Acute nondisplaced fracture through the left lamina at C6 --Neurosurgery consulted -- appreciate input --Soft C collar (can be removed when in bed and for meals) --PT/OT --Follow up with Neurosurgery in 2-3 weeks for repeat xrays  History of DVT (deep vein thrombosis) On Eliquis  S/P right colectomy No acute issues  Frequent falls .  Seronegative rheumatoid arthritis affecting lower leg (HCC) .  Insomnia Melatonin PRN        Subjective: Pt seen up in recliner.  Asking to get up to bathroom to go put on her lipstick.  She denies pain. States she has been up walking without walker and felt steady.  Reminded her of PT's recommendation to use walker at all times.  She agrees but reiterates she is steady without it. She denies any complaints.  Pain controlled on Tylenol.   Physical Exam: Vitals:   02/15/23 2128 02/16/23 0418 02/16/23 0747 02/16/23 0749  BP: (!) 155/62 (!) 157/57 (!) 154/62 (!) 146/78  Pulse: 76 73 66 67  Resp: 16 16 16    Temp: 97.8 F (36.6 C) 98.2 F (36.8 C) 98.6 F (37 C)   TempSrc: Oral Oral    SpO2: 97% 94% 95%   Weight:      Height:       General exam: awake, alert,  no acute distress HEENT: moist mucus membranes, hearing grossly normal  Respiratory system: on room air with normal respiratory effort. Cardiovascular system: RRR, no pedal edema.   Gastrointestinal system: soft, NT, ND Central nervous system: A&O x 4. no gross focal neurologic deficits, normal speech Extremities: moves all, no edema, normal tone Skin: dry,  intact, normal temperature, normal color, No rashes, lesions or ulcers Psychiatry: normal mood, congruent affect, judgement and insight appear normal   Data Reviewed:  Notable labs ---   normal BMP except Ca 8.7. Normal CBC  Family Communication: Daughter-in-law at bedside 10/4.  Son Teresa Lin updated by phone 10/4, 10/5. Will attempt to call this afternoon as time allows.  No medical updates at this time.   Disposition: Status is: Observation  Unsafe discharge home as pt will need 24/7 support at home.  Pt needs SNF placement.    Planned Discharge Destination: Skilled nursing facility    Time spent: 25 minutes  Author: Pennie Banter, DO 02/16/2023 2:12 PM  For on call review [email or web address].

## 2023-02-16 NOTE — Progress Notes (Addendum)
Physical Therapy Treatment Patient Details Name: Teresa Lin MRN: [id number] DOB: June 30, 1931 Today's Date: 02/16/2023   History of Present Illness Ms. Hjordis Howley is an 89 year old female female with history of seronegative RA, history of DVT, PE, on anticoagulation with Eliquis, who presents emergency department for chief concerns of tripping and falling in her yard. CT imaging reveals:  Acute nondisplaced fractures through the inferior articular  process of C6 bilaterally. Acute nondisplaced fracture through the  left lamina at C6.  3. Possible nondisplaced fractures through the T1 spinous process.  4. Acute nondisplaced fractures through the right first rib. Acute  fractures of the posterior left fourth-sixth ribs. Small amount of  extrapleural blood products adjacent to the rib fractures. No  pneumothorax.  5. Acute fractures through the right L2, L3, and L4 transverse  processes.    PT Comments  Pt in chair, ready for session.  She is able to stand and complete x 1 lap and stair training with RW and CGA x 1/supervision.  No formal LOB's or buckling noted that needed outside support to correct.  She does need supervision for safety and navigation and asks if she can take [place] J off for gait.  Reminded that she needs it at all times when OOB.  "Oh, that is right."  She stated she often falls at home when she "hurries".  She completes Berg balance test that is scored within limits of [place] J collar and overall score is 41/56 points which indicates walker should be used at all times.  She struggles most with single leg stance activities where she looses the most points in that area as she needed intervention to prevent falls without UE support.   Pt would benefit from using a RW at all times upon discharge.   If plan is discharge home, recommend the following: A little help with walking and/or transfers;A little help with bathing/dressing/bathroom;Assist for transportation;Help with stairs or ramp for  entrance;Assistance with cooking/housework   Can travel by private Automotive engineer (2 wheels)    Recommendations for Other Services       Precautions / Restrictions Precautions Precautions: Cervical;Back;Fall Precaution Comments: Has a [place] J Hard collar donned EOB Required Braces or Orthoses: Cervical Brace Cervical Brace: Hard collar (when out of bed) Restrictions Weight Bearing Restrictions: No     Mobility  Bed Mobility               General bed mobility comments: in chair with [place]ami collar on Patient Response: Cooperative  Transfers Overall transfer level: Needs assistance Equipment used: Rolling walker (2 wheels) Transfers: Sit to/from Stand Sit to Stand: Contact guard assist, Supervision                Ambulation/Gait Ambulation/Gait assistance: Contact guard assist Gait Distance (Feet): 200 Feet Assistive device: Rolling walker (2 wheels) Gait Pattern/deviations: Step-through pattern, Drifts right/left Gait velocity: WNL     General Gait Details: generally steady with walker but supervision is needed for navigation   Stairs Stairs: Yes Stairs assistance: Contact guard assist Stair Management: Two rails, Forwards Number of Stairs: 4 General stair comments: with ease   Wheelchair Mobility     Tilt Bed Tilt Bed Patient Response: Cooperative  Modified Rankin (Stroke Patients Only)       Balance Overall balance assessment: Needs assistance, History of Falls Sitting-balance support: Feet supported Sitting balance-Leahy Scale: Good     Standing balance support: Bilateral upper  extremity supported, During functional activity, Reliant on assistive device for balance Standing balance-Leahy Scale: Good                   Standardized Balance Assessment Standardized Balance Assessment : Berg Balance Test Berg Balance Test Sit to Stand: Able to stand without using hands and stabilize  independently Standing Unsupported: Able to stand safely 2 minutes Sitting with Back Unsupported but Feet Supported on Floor or Stool: Able to sit safely and securely 2 minutes Stand to Sit: Sits safely with minimal use of hands Transfers: Able to transfer safely, minor use of hands Standing Unsupported with Eyes Closed: Able to stand 10 seconds safely Standing Ubsupported with Feet Together: Able to place feet together independently and stand 1 minute safely From Standing, Reach Forward with Outstretched Arm: Can reach forward >12 cm safely (5") From Standing Position, Pick up Object from Floor: Able to pick up shoe, needs supervision From Standing Position, Turn to Look Behind Over each Shoulder: Needs assist to keep from losing balance and falling (limited by mi[place] J collar and precations) Turn 360 Degrees: Able to turn 360 degrees safely in 4 seconds or less Standing Unsupported, Alternately Place Feet on Step/Stool: Able to complete >2 steps/needs minimal assist Standing Unsupported, One Foot in Front: Needs help to step but can hold 15 seconds Standing on One Leg: Tries to lift leg/unable to hold 3 seconds but remains standing independently Total Score: 41        Cognition Arousal: Alert Behavior During Therapy: WFL for tasks assessed/performed Overall Cognitive Status: No family/caregiver present to determine baseline cognitive functioning                                 General Comments: cognitive deficits noted - unsure if they are baseline        Exercises      General Comments General comments (skin integrity, edema, etc.): 41/56 points - most difficulty with SLS activities      Pertinent Vitals/Pain Pain Assessment Pain Assessment: No/denies pain    Home Living                          Prior Function            PT Goals (current goals can now be found in the care plan section) Progress towards PT goals: Progressing toward goals     Frequency    Min 1X/week      PT Plan      Co-evaluation              AM-PAC PT "6 Clicks" Mobility   Outcome Measure  Help needed turning from your back to your side while in a flat bed without using bedrails?: A Little Help needed moving from lying on your back to sitting on the side of a flat bed without using bedrails?: A Little Help needed moving to and from a bed to a chair (including a wheelchair)?: A Little Help needed standing up from a chair using your arms (e.g., wheelchair or bedside chair)?: A Little Help needed to walk in hospital room?: A Little Help needed climbing 3-5 steps with a railing? : A Little 6 Click Score: 18    End of Session Equipment Utilized During Treatment: Cervical collar Activity Tolerance: Patient tolerated treatment well Patient left: in chair;with call bell/phone within reach;with chair alarm set Nurse Communication:  Mobility status PT Visit Diagnosis: Unsteadiness on feet (R26.81);Other abnormalities of gait and mobility (R26.89);Muscle weakness (generalized) (M62.81);History of falling (Z91.81);Difficulty in walking, not elsewhere classified (R26.2);Pain Pain - Right/Left: Right     Time: 0812-0824 PT Time Calculation (min) (ACUTE ONLY): 12 min  Charges:    $Gait Training: 8-22 mins PT General Charges $$ ACUTE PT VISIT: 1 Visit                   Danielle Dess, PTA 02/16/23, 9:17 AM

## 2023-02-17 ENCOUNTER — Observation Stay: Payer: No Typology Code available for payment source

## 2023-02-17 DIAGNOSIS — Z9181 History of falling: Secondary | ICD-10-CM | POA: Diagnosis not present

## 2023-02-17 DIAGNOSIS — J90 Pleural effusion, not elsewhere classified: Secondary | ICD-10-CM | POA: Diagnosis not present

## 2023-02-17 DIAGNOSIS — S2242XA Multiple fractures of ribs, left side, initial encounter for closed fracture: Secondary | ICD-10-CM | POA: Diagnosis not present

## 2023-02-17 DIAGNOSIS — Z111 Encounter for screening for respiratory tuberculosis: Secondary | ICD-10-CM | POA: Diagnosis not present

## 2023-02-17 NOTE — Progress Notes (Signed)
Physical Therapy Treatment Patient Details Name: Teresa Lin MRN: [id number] DOB: [birth year] Today's Date: 02/17/2023   History of Present Illness Teresa Lin is an 89 year old female female with history of seronegative RA, history of DVT, PE, on anticoagulation with Eliquis, who presents emergency department for chief concerns of tripping and falling in her yard. CT imaging reveals:  Acute nondisplaced fractures through the inferior articular  process of C6 bilaterally. Acute nondisplaced fracture through the  left lamina at C6.  3. Possible nondisplaced fractures through the T1 spinous process.  4. Acute nondisplaced fractures through the right first rib. Acute  fractures of the posterior left fourth-sixth ribs. Small amount of  extrapleural blood products adjacent to the rib fractures. No  pneumothorax.  5. Acute fractures through the right L2, L3, and L4 transverse  processes.    PT Comments  Pt in chair,  she is able to walk x 2 laps on unit and complete stair training with supervision.  Increased confidence today.  Session focused on standing ex without UE support for strength and balance.  She does need supervision for general safety and sway but does do better with SLS activities today but RW is needed for safe ambulation at home.     If plan is discharge home, recommend the following: A little help with walking and/or transfers;A little help with bathing/dressing/bathroom;Assist for transportation;Help with stairs or ramp for entrance;Assistance with cooking/housework   Can travel by private Automotive engineer (2 wheels)    Recommendations for Other Services       Precautions / Restrictions Precautions Precautions: Cervical;Back;Fall Precaution Comments: Has a [place] J Hard collar donned EOB Required Braces or Orthoses: Cervical Brace Cervical Brace: Hard collar (when out of bed) Restrictions Weight Bearing Restrictions: No     Mobility   Bed Mobility               General bed mobility comments: in chair with [place] collar on Patient Response: Cooperative  Transfers Overall transfer level: Needs assistance Equipment used: Rolling walker (2 wheels) Transfers: Sit to/from Stand Sit to Stand: Supervision                Ambulation/Gait Ambulation/Gait assistance: Supervision Gait Distance (Feet): 400 Feet Assistive device: Rolling walker (2 wheels)   Gait velocity: WNL     General Gait Details: generally steady with walker but supervision is needed for navigation   Stairs Stairs: Yes Stairs assistance: Contact guard assist Stair Management: Two rails, Forwards Number of Stairs: 8 General stair comments: with ease   Wheelchair Mobility     Tilt Bed Tilt Bed Patient Response: Cooperative  Modified Rankin (Stroke Patients Only)       Balance Overall balance assessment: Needs assistance, History of Falls Sitting-balance support: Feet supported Sitting balance-Leahy Scale: Normal     Standing balance support: Bilateral upper extremity supported, During functional activity, Reliant on assistive device for balance Standing balance-Leahy Scale: Good                              Cognition Arousal: Alert Behavior During Therapy: WFL for tasks assessed/performed Overall Cognitive Status: No family/caregiver present to determine baseline cognitive functioning  Exercises Other Exercises Other Exercises: standing ex without UE support for BLE balance challenges.  doen need UE support for toe raises,  requires supervision in general due to sway    General Comments        Pertinent Vitals/Pain Pain Assessment Pain Assessment: No/denies pain    Home Living                          Prior Function            PT Goals (current goals can now be found in the care plan section) Progress towards PT goals:  Progressing toward goals    Frequency    Min 1X/week      PT Plan      Co-evaluation              AM-PAC PT "6 Clicks" Mobility   Outcome Measure  Help needed turning from your back to your side while in a flat bed without using bedrails?: A Little Help needed moving from lying on your back to sitting on the side of a flat bed without using bedrails?: A Little Help needed moving to and from a bed to a chair (including a wheelchair)?: A Little Help needed standing up from a chair using your arms (e.g., wheelchair or bedside chair)?: None Help needed to walk in hospital room?: A Little Help needed climbing 3-5 steps with a railing? : A Little 6 Click Score: 19    End of Session Equipment Utilized During Treatment: Cervical collar Activity Tolerance: Patient tolerated treatment well Patient left: in chair;with call bell/phone within reach;with chair alarm set Nurse Communication: Mobility status PT Visit Diagnosis: Unsteadiness on feet (R26.81);Other abnormalities of gait and mobility (R26.89);Muscle weakness (generalized) (M62.81);History of falling (Z91.81);Difficulty in walking, not elsewhere classified (R26.2);Pain Pain - Right/Left: Right     Time: 0912-0925 PT Time Calculation (min) (ACUTE ONLY): 13 min  Charges:    $Gait Training: 8-22 mins PT General Charges $$ ACUTE PT VISIT: 1 Visit                   Danielle Dess, PTA 02/17/23, 10:22 AM

## 2023-02-17 NOTE — Progress Notes (Signed)
Mobility Specialist - Progress Note   02/17/23 0848  Mobility  Activity Ambulated with assistance in hallway  Level of Assistance Standby assist, set-up cues, supervision of patient - no hands on  Assistive Device Front wheel walker  Distance Ambulated (ft) 480 ft  Activity Response Tolerated well  Mobility Referral Yes  $Mobility charge 1 Mobility  Mobility Specialist Start Time (ACUTE ONLY) [id number]  Mobility Specialist Stop Time (ACUTE ONLY) 0847  Mobility Specialist Time Calculation (min) (ACUTE ONLY) 13 min   Pt sitting in recliner on RA upon arrival. Pt STS and ambulates in hallway Supervision with no LOB noted. Pt returns to recliner with needs in reach and chair alarm activated.   Terrilyn Saver  Mobility Specialist  02/17/23 8:50 AM

## 2023-02-17 NOTE — Progress Notes (Signed)
Progress Note   Patient: Teresa Lin SAY:[id number] DOB: 1932/02/09 DOA: 02/13/2023     0 DOS: the patient was seen and examined on 02/17/2023   Brief hospital course: Ms. Teresa Lin is an 89 year old female female with history of seronegative RA, history of DVT, PE, on anticoagulation with Eliquis, who presents emergency department for chief concerns of tripping and falling in her yard.   See H&P for full HPI on admission.  In summary, patient was found to have multiple fractures including C6, T1, Right 1st rib, Left posterior ribs 4-6, and L2-L4 transverse processes.  Pt admitted for close monitoring and further management.  PT/OT recommend SNF placement for rehab which is appropriate given patients frequent fall results in multiple fractures, lack of 24/7 assistance in the home where she lives alone.  Further hospital course and management as outlined below.    Assessment and Plan: * At risk for falling Family report patient has had 13 falls, 4 of the last 5 falls have resulted in fractures.  Pt lives alone at home independent.  Given that she has fractured vertebrae including cervical, thoracic, lumbar and ribs, patient will have limited ability to perform her activities of daily living. --PT, OT, TOC consulted --Symptomatic support --Fall precautions  History of pulmonary embolism On Eliquis  Rheumatoid arthritis of multiple sites without rheumatoid factor (HCC) .  Leukocytosis Suspect reactive in setting of multiple rib fracture and spinal fractures.  CXR no pneumonia.   --UA pending - follow results --Monitor CBC --No indication for antibiotics at this time --Monitor clinically for s/sx's of infection  Lumbar transverse process fracture (HCC) Right L2, L3, L4 transverse processes fractures Per Neurosurgery - no need for bracing. Pain control -- scheduled Tylenol PRN Tramadol, PRN IV morphine for severe pain  Rib fractures Acute nondisplaced fractures right first  rib Acute fractures of the posterior left 4th-6th ribs Small amount of extrapleural blood products adjacent to the rib fractures, no pneumothorax --Incentive spirometry, flutter valve --Pain control per orders  T1 vertebral fracture (HCC) No bracing needed per Neurosurgery Pain control per orders PT/OT  Fracture of cervical vertebra, C6 (HCC) Acute nondisplaced, through the inferior articular process of C6 bilaterally Acute nondisplaced fracture through the left lamina at C6 --Neurosurgery consulted -- appreciate input --Soft C collar (can be removed when in bed and for meals) --PT/OT --Follow up with Neurosurgery in 2-3 weeks for repeat xrays  History of DVT (deep vein thrombosis) On Eliquis  S/P right colectomy No acute issues  Frequent falls .  Seronegative rheumatoid arthritis affecting lower leg (HCC) .  Insomnia Melatonin PRN        Subjective: Pt seen up in recliner.  Son at bedside.  Pt continues to do well, no complaints of pain.  Working with PT / OT and states she already walked the hall twice this AM.  She denies any complaints.     Physical Exam: Vitals:   02/16/23 1714 02/16/23 1942 02/17/23 0517 02/17/23 0824  BP: (!) 126/52 (!) 160/68 (!) 156/66 129/61  Pulse: 80 76 70 66  Resp: 16 18 18 16   Temp: 98.4 F (36.9 C) 98.4 F (36.9 C) 98.1 F (36.7 C) 98.7 F (37.1 C)  TempSrc:   Oral Oral  SpO2: 100% 99% 96% 95%  Weight:      Height:       General exam: awake, alert, no acute distress HEENT: moist mucus membranes, hearing grossly normal  Respiratory system: on room air with normal respiratory effort.  Lungs clear no wheezes or rhonchi Cardiovascular system: RRR, no pedal edema.   Gastrointestinal system: soft, NT, ND Central nervous system: A&O x 4. no gross focal neurologic deficits, normal speech Extremities: moves all, no edema, normal tone Skin: dry, intact, normal temperature, normal color, No rashes, lesions or ulcers Psychiatry:  normal mood, congruent affect, judgement and insight appear normal   Data Reviewed:  No new labs.     Family Communication: Son Zenaida Niece at bedside on round this AM   Disposition: Status is: Observation  Unsafe discharge home as pt will need 24/7 support at home.  Pt needs SNF placement or in home caregiver support arranged prior to safe d/c.  Anticipate d/c home tomorrow.  Son has an agency to assist with care at home, coming to assess pt today.    Planned Discharge Destination: Skilled nursing facility    Time spent: 25 minutes  Author: Pennie Banter, DO 02/17/2023 2:25 PM  For on call review [email or web address]

## 2023-02-17 NOTE — TOC Progression Note (Addendum)
Transition of Care [facility] - Progression Note    Patient Details  Name: Teresa Lin MRN: [id number] Date of Birth: 1932-02-17  Transition of Care [facility]) CM/SW Contact  Allena Katz, LCSW Phone Number: 02/17/2023, 3:24 PM  Clinical Narrative:   Son wanted to use agency they used in past. Per patient ping they were using [facility]. Morrie Sheldon with [facility] contacted.   [facility] Sheldon with [facility] accepted for PT/OT.      Expected Discharge Plan and Services                                               Social Determinants of Health (SDOH) Interventions SDOH Screenings   Food Insecurity: No Food Insecurity (02/15/2023)  Housing: Low Risk  (02/15/2023)  Transportation Needs: No Transportation Needs (02/15/2023)  Utilities: Not At Risk (02/15/2023)  Financial Resource Strain: Low Risk  (10/23/2022)   Received from [facility], [facility]  Physical Activity: Insufficiently Active (05/11/2020)   Received from [facility], [facility]  Social Connections: Socially Isolated (05/18/2020)   Received from [facility], [facility]  Stress: Stress Concern Present (05/17/2020)   Received from [facility], [facility]  Tobacco Use: Low Risk  (02/13/2023)    Readmission Risk Interventions     No data to display

## 2023-02-17 NOTE — TOC Progression Note (Addendum)
Transition of Care [facility]) - Progression Note    Patient Details  Name: MITCHELL EPLING MRN: [id number] Date of Birth: Oct 21, 1931  Transition of Care [facility]) CM/SW Contact  Allena Katz, LCSW Phone Number: 02/17/2023, 9:35 AM  Clinical Narrative:   [facility] unable to take patient. [facility] reports she is going to look at the numbers it would cost to private pay and will call me back.     10:33am  [facility] notified [facility] not [facility] Thursday or Friday. Son would like to take pt home and has contacted leslie with [facility] to start services. Son has used [facility] before and wants to use the same agency and will get back to me on who it is that he has used in past.     Expected Discharge Plan and Services                                               Social Determinants of Health (SDOH) Interventions SDOH Screenings   Food Insecurity: No Food Insecurity (02/15/2023)  Housing: Low Risk  (02/15/2023)  Transportation Needs: No Transportation Needs (02/15/2023)  Utilities: Not At Risk (02/15/2023)  Financial Resource Strain: Low Risk  (10/23/2022)   Received from [facility], [facility]  Physical Activity: Insufficiently Active (05/11/2020)   Received from [facility], [facility]  Social Connections: Socially Isolated (05/18/2020)   Received from [facility], [facility]  Stress: Stress Concern Present (05/17/2020)   Received from [facility], [facility]  Tobacco Use: Low Risk  (02/13/2023)    Readmission Risk Interventions     No data to display

## 2023-02-17 NOTE — Plan of Care (Signed)

## 2023-02-17 NOTE — Progress Notes (Signed)
Occupational Therapy Treatment Patient Details Name: Teresa Lin MRN: [id number] DOB: 06/30/1931 Today's Date: 02/17/2023   History of present illness Ms. Teresa Lin is an 89 year old female female with history of seronegative RA, history of DVT, PE, on anticoagulation with Eliquis, who presents emergency department for chief concerns of tripping and falling in her yard. CT imaging reveals:  Acute nondisplaced fractures through the inferior articular  process of C6 bilaterally. Acute nondisplaced fracture through the  left lamina at C6.  3. Possible nondisplaced fractures through the T1 spinous process.  4. Acute nondisplaced fractures through the right first rib. Acute  fractures of the posterior left fourth-sixth ribs. Small amount of  extrapleural blood products adjacent to the rib fractures. No  pneumothorax.  5. Acute fractures through the right L2, L3, and L4 transverse  processes.   OT comments  Pt seen for OT treatment on this date. Upon arrival to room pt seated in chair, agreeable to tx. Son present during session. Pt completed sit<> stand t/f and ambulated to bathroom with MOD I utilizing a RW. Patient completed toilet t/f, toilet management, and hygiene with supervision. Patient completed lower body bathing in standing and dressing with supervision. Pt ambulated 200 ft with MOD I in the hallway and back to her room in her chair. Patient left in chair with call bell within reach and chair alarm. Pt making good progress toward goals, will continue to follow POC. Discharge recommendation have been updated.        If plan is discharge home, recommend the following:  Assist for transportation;A little help with bathing/dressing/bathroom;A little help with walking and/or transfers;Assistance with cooking/housework   Equipment Recommendations  None recommended by OT    Recommendations for Other Services      Precautions / Restrictions Precautions Precautions: Cervical;Back;Fall Precaution  Comments: Has a [place] Hard collar donned. Required Braces or Orthoses: Cervical Brace Cervical Brace: Hard collar Restrictions Weight Bearing Restrictions: No       Mobility Bed Mobility                    Transfers Overall transfer level: Needs assistance Equipment used: Rolling walker (2 wheels) Transfers: Sit to/from Stand Sit to Stand: Supervision                 Balance Overall balance assessment: Needs assistance, History of Falls Sitting-balance support: Feet supported Sitting balance-Leahy Scale: Normal     Standing balance support: Bilateral upper extremity supported, During functional activity, Reliant on assistive device for balance Standing balance-Leahy Scale: Good                             ADL either performed or assessed with clinical judgement   ADL Overall ADL's : Needs assistance/impaired     Grooming: Wash/dry hands;Standing;Modified independent       Lower Body Bathing: Supervison/ safety Lower Body Bathing Details (indicate cue type and reason): Pt completed lower body bathing in standing with supervision.     Lower Body Dressing: Sit to/from stand;Modified independent   Toilet Transfer: Supervision/safety;Regular Toilet;Grab bars   Toileting- Clothing Manipulation and Hygiene: Supervision/safety;Sit to/from stand       Functional mobility during ADLs: Supervision/safety;Rolling walker (2 wheels)      Extremity/Trunk Assessment Upper Extremity Assessment Upper Extremity Assessment: Generalized weakness   Lower Extremity Assessment Lower Extremity Assessment: Generalized weakness        Vision Baseline Vision/History: 0 No visual  deficits     Perception     Praxis      Cognition Arousal: Alert Behavior During Therapy: WFL for tasks assessed/performed Overall Cognitive Status: Within Functional Limits for tasks assessed                                          Exercises       Shoulder Instructions       General Comments      Pertinent Vitals/ Pain          Home Living                                          Prior Functioning/Environment              Frequency  Min 1X/week        Progress Toward Goals  OT Goals(current goals can now be found in the care plan section)  Progress towards OT goals: Progressing toward goals  Acute Rehab OT Goals Patient Stated Goal: to go home OT Goal Formulation: With patient Time For Goal Achievement: 02/28/23 Potential to Achieve Goals: Good  Plan      Co-evaluation                 AM-PAC OT "6 Clicks" Daily Activity     Outcome Measure   Help from another person eating meals?: None Help from another person taking care of personal grooming?: A Little Help from another person toileting, which includes using toliet, bedpan, or urinal?: A Little Help from another person bathing (including washing, rinsing, drying)?: A Little Help from another person to put on and taking off regular upper body clothing?: A Little Help from another person to put on and taking off regular lower body clothing?: A Little 6 Click Score: 19    End of Session Equipment Utilized During Treatment: Cervical collar  OT Visit Diagnosis: History of falling (Z91.81);Muscle weakness (generalized) (M62.81)   Activity Tolerance Patient tolerated treatment well   Patient Left in chair;with call bell/phone within reach;with chair alarm set;with family/visitor present   Nurse Communication          Time:  -     Charges:    Butch Penny, SOT

## 2023-02-18 DIAGNOSIS — Z9181 History of falling: Secondary | ICD-10-CM | POA: Diagnosis not present

## 2023-02-18 MED ORDER — LIDOCAINE 5 % EX OINT: 1.0000 | TOPICAL_OINTMENT | Freq: Three times a day (TID) | CUTANEOUS | 0 refills | Status: DC | PRN

## 2023-02-18 MED ORDER — POLYETHYL GLYCOL-PROPYL GLYCOL 0.4-0.3 % OP SOLN: 1.0000 [drp] | Freq: Three times a day (TID) | OPHTHALMIC | Status: AC | PRN

## 2023-02-18 MED ORDER — ACETAMINOPHEN 500 MG PO TABS: 1000.0000 mg | ORAL_TABLET | Freq: Three times a day (TID) | ORAL | Status: DC

## 2023-02-18 MED ORDER — DOCUSATE SODIUM 100 MG PO CAPS: 100.0000 mg | ORAL_CAPSULE | Freq: Two times a day (BID) | ORAL | Status: DC | PRN

## 2023-02-18 NOTE — TOC Transition Note (Signed)
Transition of Care [facility]) - CM/SW Discharge Note   Patient Details  Name: Teresa Lin MRN: [id number] Date of Birth: 02/02/1932  Transition of Care [facility]) CM/SW Contact:  Allena Katz, LCSW Phone Number: 02/18/2023, 9:30 AM   Clinical Narrative:  Pt has orders to discharge home today with [facility]. Morrie Sheldon with [facility] notified. [facility] Au [facility] notified. Son reports no DME needs at this time as patient has all needed DME.       Final next level of care: Skilled Nursing Facility Barriers to Discharge: Barriers Resolved   Patient Goals and CMS Choice CMS Medicare.gov Compare Post Acute Care list provided to:: Patient Choice offered to / list presented to : Patient  Discharge Placement                  Patient to be transferred to facility by: son Name of family member notified: son    Discharge Plan and Services Additional resources added to the After Visit Summary for                            [facility] Arranged: PT, OT   Date [facility] Agency Contacted: 02/17/23      Social Determinants of Health (SDOH) Interventions SDOH Screenings   Food Insecurity: No Food Insecurity (02/15/2023)  Housing: Low Risk  (02/15/2023)  Transportation Needs: No Transportation Needs (02/15/2023)  Utilities: Not At Risk (02/15/2023)  Financial Resource Strain: Low Risk  (10/23/2022)   Received from [facility], [facility]  Physical Activity: Insufficiently Active (05/11/2020)   Received from [facility], [facility]  Social Connections: Socially Isolated (05/18/2020)   Received from [facility], [facility]  Stress: Stress Concern Present (05/17/2020)   Received from [facility], [facility]  Tobacco Use: Low Risk  (02/13/2023)     Readmission Risk Interventions     No data to display

## 2023-02-18 NOTE — Discharge Summary (Signed)
Physician Discharge Summary   Patient: Teresa Lin MRN: [id number] DOB: 10-Jan-1932  Admit date:     02/13/2023  Discharge date: 02/18/2023  Discharge Physician: Pennie Banter   PCP: Lynnea Ferrier, MD   Recommendations at discharge:   Follow up with Neurosurgery Follow up with Primary Care Repeat CBC, BMP at follow up Continue to address falls risk & aggressive prevention measures   Discharge Diagnoses: Principal Problem:   At risk for falling Active Problems:   History of pulmonary embolism   Rheumatoid arthritis of multiple sites without rheumatoid factor (HCC)   Insomnia   Seronegative rheumatoid arthritis affecting lower leg (HCC)   Frequent falls   S/P right colectomy   History of DVT (deep vein thrombosis)   Fracture of cervical vertebra, C6 (HCC)   T1 vertebral fracture (HCC)   Rib fractures   Lumbar transverse process fracture (HCC)   Leukocytosis  Resolved Problems:   * No resolved hospital problems. [facility] Course: Ms. Teresa Lin is an 89 year old female female with history of seronegative RA, history of DVT, PE, on anticoagulation with Eliquis, who presents emergency department for chief concerns of tripping and falling in her yard.   See H&P for full HPI on admission.  In summary, patient was found to have multiple fractures including C6, T1, Right 1st rib, Left posterior ribs 4-6, and L2-L4 transverse processes.  Pt admitted for close monitoring and further management.  PT/OT recommend SNF placement for rehab which is appropriate given patients frequent fall results in multiple fractures, lack of 24/7 assistance in the home where she lives alone.  Further hospital course and management as outlined below.  10/8 - pt doing well, up in chair, has been ambulating this AM already.  Son at bedside.  Pt denies any pain or other complaints.  Medically stable to discharge today home with home health for PT/OT.     Assessment and Plan: * At risk for  falling Family report patient has had 13 falls, 4 of the last 5 falls have resulted in fractures.  Pt lives alone at home independent.  Given that she has fractured vertebrae including cervical, thoracic, lumbar and ribs, patient will have limited ability to perform her activities of daily living. --PT, OT, TOC consulted --Symptomatic support --Fall precautions  History of pulmonary embolism On Eliquis  Rheumatoid arthritis of multiple sites without rheumatoid factor (HCC) .  Leukocytosis Suspect reactive in setting of multiple rib fracture and spinal fractures.  CXR no pneumonia.   --UA pending - follow results --Monitor CBC --No indication for antibiotics at this time --Monitor clinically for s/sx's of infection  Lumbar transverse process fracture (HCC) Right L2, L3, L4 transverse processes fractures Per Neurosurgery - no need for bracing. Pain control -- scheduled Tylenol PRN Tramadol, PRN IV morphine for severe pain  Rib fractures Acute nondisplaced fractures right first rib Acute fractures of the posterior left 4th-6th ribs Small amount of extrapleural blood products adjacent to the rib fractures, no pneumothorax --Incentive spirometry, flutter valve --Pain control per orders  T1 vertebral fracture (HCC) No bracing needed per Neurosurgery Pain control per orders PT/OT  Fracture of cervical vertebra, C6 (HCC) Acute nondisplaced, through the inferior articular process of C6 bilaterally Acute nondisplaced fracture through the left lamina at C6 --Neurosurgery consulted -- appreciate input --Soft C collar (can be removed when in bed and for meals) --PT/OT --Follow up with Neurosurgery in 2-3 weeks for repeat xrays  History of DVT (deep vein thrombosis)  On Eliquis  S/P right colectomy No acute issues  Frequent falls .  Seronegative rheumatoid arthritis affecting lower leg (HCC) .  Insomnia Melatonin PRN         Consultants: None - neurosurgery  "curbsided" by EDP Procedures performed: None  Disposition: Home health Diet recommendation:  Discharge Diet Orders (From admission, onward)     Start     Ordered   02/18/23 0000  Diet - low sodium heart healthy        02/18/23 0909            DISCHARGE MEDICATION: Allergies as of 02/18/2023       Reactions   Infliximab Other (See Comments)   Shaking, pain   Lidocaine-menthol    Synvisc [hylan G-f 20] Swelling        Medication List     TAKE these medications    acetaminophen 500 MG tablet Commonly known as: TYLENOL Take 2 tablets (1,000 mg total) by mouth every 8 (eight) hours.   apixaban 5 MG Tabs tablet Commonly known as: ELIQUIS Take 5 mg by mouth 2 (two) times daily.   calcium-vitamin D 500-200 MG-UNIT tablet Commonly known as: OSCAL WITH D Take 1 tablet by mouth daily.   docusate sodium 100 MG capsule Commonly known as: COLACE Take 1 capsule (100 mg total) by mouth 2 (two) times daily as needed for mild constipation.   donepezil 5 MG tablet Commonly known as: ARICEPT Take 5 mg by mouth at bedtime.   folic acid 1 MG tablet Commonly known as: FOLVITE Take 2 mg by mouth daily.   hydroxychloroquine 200 MG tablet Commonly known as: PLAQUENIL Take 200 mg by mouth See admin instructions. Take 2 tablets (400mg ) by mouth every Monday, Wednesday and Friday and take 1 tablet (200mg ) by mouth every Tuesday, Thursday, Saturday and Sunday   lidocaine 5 % ointment Commonly known as: XYLOCAINE Apply 1 Application topically 3 (three) times daily as needed. To pain rib cage area   methotrexate 1 g injection Commonly known as: 50 mg/ml Inject 20 mg into the vein once a week.   multivitamin with minerals tablet Take 1 tablet by mouth daily. Centrum Silver   Polyethyl Glycol-Propyl Glycol 0.4-0.3 % Soln Place 1 drop into both eyes 3 (three) times daily as needed (for eye burning/irritation).        Follow-up Information     Susanne Borders, PA Follow  up on 03/04/2023.   Specialty: Neurosurgery Contact information: [address] [place] [address] [phone]                Discharge Exam: Ceasar Mons Weights   02/13/23 1244  Weight: 70.8 kg   General exam: awake, alert, no acute distress HEENT: wearing cervical collar, moist mucus membranes, hearing grossly normal  Respiratory system: CTAB, no wheezes, rales or rhonchi, normal respiratory effort. Cardiovascular system: normal S1/S2, RRR, no JVD, murmurs, rubs, gallops,  no pedal edema.   Gastrointestinal system: soft, NT, ND, no HSM felt, +bowel sounds. Central nervous system: A&O x 4. no gross focal neurologic deficits, normal speech Extremities: moves all , no edema, normal tone Skin: dry, intact, normal temperature Psychiatry: normal mood, congruent affect, judgement and insight appear normal   Condition at discharge: stable  The results of significant diagnostics from this hospitalization (including imaging, microbiology, ancillary and laboratory) are listed below for reference.   Imaging Studies: DG Chest Port 1 View  Result Date: 02/17/2023 CLINICAL DATA:  Screening for pulmonary  tuberculosis. EXAM: PORTABLE CHEST 1 VIEW COMPARISON:  Radiograph 02/13/2023 FINDINGS: The cardiomediastinal contours are normal. The lungs are clear. Trace left pleural effusion. No pneumothorax. No focal airspace disease. Stable appearance of left posterior rib fractures. IMPRESSION: 1. No radiographic findings of tuberculosis. 2. Stable appearance of left rib fractures. Trace left pleural effusion. Electronically Signed   By: Narda Rutherford M.D.   On: 02/17/2023 20:54   DG Chest Port 1 View  Result Date: 02/13/2023 CLINICAL DATA:  Status post fall with back pain EXAM: PORTABLE CHEST 1 VIEW COMPARISON:  Chest radiograph dated 02/01/2020 FINDINGS: Normal lung volumes. No focal consolidations. No pleural effusion or pneumothorax. The heart size and mediastinal  contours are within normal limits. Partially imaged left shoulder arthroplasty. Minimally displaced left posterior fourth through seventh rib fractures. Right first rib fracture is better evaluated on same day CT. IMPRESSION: 1. Minimally displaced left posterior fourth through seventh rib fractures. Right first rib fracture is better evaluated on same day CT. 2. No pneumothorax. Electronically Signed   By: Agustin Cree M.D.   On: 02/13/2023 19:24   CT Head Wo Contrast  Result Date: 02/13/2023 CLINICAL DATA:  Head trauma, minor (Age >= 65y); Neck trauma (Age >= 65y); Back trauma, no prior imaging (Age >= 16y) EXAM: CT HEAD WITHOUT CONTRAST CT CERVICAL SPINE WITHOUT CONTRAST CT THORACIC SPINE WITHOUT CONTRAST CT LUMBAR SPINE WITHOUT CONTRAST TECHNIQUE: Multidetector CT imaging of the head, cervical spine, thoracic spine, and lumbar spine was performed following the standard protocol without intravenous contrast. Multiplanar CT image reconstructions of the cervical spine were also generated. RADIATION DOSE REDUCTION: This exam was performed according to the departmental dose-optimization program which includes automated exposure control, adjustment of the mA and/or kV according to patient size and/or use of iterative reconstruction technique. COMPARISON:  CT Head and C Spine 03/20/22, CTA Chest 05/29/21, CT AP 07/03/18 FINDINGS: CT HEAD FINDINGS Brain: No hemorrhage. No hydrocephalus. No extra-axial fluid collection. No CT evidence of an acute cortical infarct. There is sequela of severe chronic microvascular ischemic change. No mass effect. No mass lesion Vascular: No hyperdense vessel or unexpected calcification. Skull: Soft tissue hematoma along the posterior scalp. No evidence of an underlying calvarial fracture. Sinuses/Orbits: No middle ear or mastoid effusion. Paranasal sinuses are clear. Bilateral lens replacement. Orbits are otherwise unremarkable. Other: None. CT CERVICAL SPINE FINDINGS Alignment: Grade 1  anterolisthesis of C4 on C5. Skull base and vertebrae: Acute nondisplaced fracture through the inferior articular process of C6 on the right (series 6, image 33) left (series 6, image 45-26). Acute nondisplaced fracture through the left lamina at C6 (series 3, image 50). Soft tissues and spinal canal: No prevertebral fluid or swelling. No visible canal hematoma. 1.5 cm left thyroid nodule. Disc levels:  No evidence of high-grade spinal canal stenosis. Upper chest: Negative. CT THORACIC SPINE FINDINGS Alignment: Normal. Vertebrae: Possible nondisplaced fractures through the T1 spinous process (series 6, image 44). Paraspinal and other soft tissues: Acute nondisplaced fractures through the right first rib (series 3, image 65). Acute fractures of the posterior left fourth-sixth ribs. There is a small amount of extrapleural blood products adjacent to the rib fractures. No pneumothorax. Disc levels: No evidence of high-grade spinal canal stenosis. CT LUMBAR SPINE FINDINGS Segmentation: 5 lumbar type vertebrae. Alignment: Normal. Vertebrae: Chronic superior endplate compression deformity at L2. acute fractures through the right L2 transverse process (series 4, image 162), L3 transverse process (series 4, image 180), L4 transverse process (series 4, image 69). Paraspinal and other  soft tissues: Diverticulosis without evidence of diverticulitis. Disc levels: No evidence of high-grade spinal canal stenosis. IMPRESSION: 1. Soft tissue hematoma along the posterior scalp. No evidence of an underlying calvarial fracture or intracranial injury. 2. Acute nondisplaced fractures through the inferior articular process of C6 bilaterally. Acute nondisplaced fracture through the left lamina at C6. 3. Possible nondisplaced fractures through the T1 spinous process. 4. Acute nondisplaced fractures through the right first rib. Acute fractures of the posterior left fourth-sixth ribs. Small amount of extrapleural blood products adjacent to  the rib fractures. No pneumothorax. 5. Acute fractures through the right L2, L3, and L4 transverse processes. 6. 1.5 cm left thyroid nodule. Consider further evaluation with a dedicated thyroid ultrasound if consistent with patient's goals of care. Electronically Signed   By: Lorenza Cambridge M.D.   On: 02/13/2023 16:42   CT Cervical Spine Wo Contrast  Result Date: 02/13/2023 CLINICAL DATA:  Head trauma, minor (Age >= 65y); Neck trauma (Age >= 65y); Back trauma, no prior imaging (Age >= 16y) EXAM: CT HEAD WITHOUT CONTRAST CT CERVICAL SPINE WITHOUT CONTRAST CT THORACIC SPINE WITHOUT CONTRAST CT LUMBAR SPINE WITHOUT CONTRAST TECHNIQUE: Multidetector CT imaging of the head, cervical spine, thoracic spine, and lumbar spine was performed following the standard protocol without intravenous contrast. Multiplanar CT image reconstructions of the cervical spine were also generated. RADIATION DOSE REDUCTION: This exam was performed according to the departmental dose-optimization program which includes automated exposure control, adjustment of the mA and/or kV according to patient size and/or use of iterative reconstruction technique. COMPARISON:  CT Head and C Spine 03/20/22, CTA Chest 05/29/21, CT AP 07/03/18 FINDINGS: CT HEAD FINDINGS Brain: No hemorrhage. No hydrocephalus. No extra-axial fluid collection. No CT evidence of an acute cortical infarct. There is sequela of severe chronic microvascular ischemic change. No mass effect. No mass lesion Vascular: No hyperdense vessel or unexpected calcification. Skull: Soft tissue hematoma along the posterior scalp. No evidence of an underlying calvarial fracture. Sinuses/Orbits: No middle ear or mastoid effusion. Paranasal sinuses are clear. Bilateral lens replacement. Orbits are otherwise unremarkable. Other: None. CT CERVICAL SPINE FINDINGS Alignment: Grade 1 anterolisthesis of C4 on C5. Skull base and vertebrae: Acute nondisplaced fracture through the inferior articular process of  C6 on the right (series 6, image 33) left (series 6, image 45-26). Acute nondisplaced fracture through the left lamina at C6 (series 3, image 50). Soft tissues and spinal canal: No prevertebral fluid or swelling. No visible canal hematoma. 1.5 cm left thyroid nodule. Disc levels:  No evidence of high-grade spinal canal stenosis. Upper chest: Negative. CT THORACIC SPINE FINDINGS Alignment: Normal. Vertebrae: Possible nondisplaced fractures through the T1 spinous process (series 6, image 44). Paraspinal and other soft tissues: Acute nondisplaced fractures through the right first rib (series 3, image 65). Acute fractures of the posterior left fourth-sixth ribs. There is a small amount of extrapleural blood products adjacent to the rib fractures. No pneumothorax. Disc levels: No evidence of high-grade spinal canal stenosis. CT LUMBAR SPINE FINDINGS Segmentation: 5 lumbar type vertebrae. Alignment: Normal. Vertebrae: Chronic superior endplate compression deformity at L2. acute fractures through the right L2 transverse process (series 4, image 162), L3 transverse process (series 4, image 180), L4 transverse process (series 4, image 69). Paraspinal and other soft tissues: Diverticulosis without evidence of diverticulitis. Disc levels: No evidence of high-grade spinal canal stenosis. IMPRESSION: 1. Soft tissue hematoma along the posterior scalp. No evidence of an underlying calvarial fracture or intracranial injury. 2. Acute nondisplaced fractures through the inferior articular  process of C6 bilaterally. Acute nondisplaced fracture through the left lamina at C6. 3. Possible nondisplaced fractures through the T1 spinous process. 4. Acute nondisplaced fractures through the right first rib. Acute fractures of the posterior left fourth-sixth ribs. Small amount of extrapleural blood products adjacent to the rib fractures. No pneumothorax. 5. Acute fractures through the right L2, L3, and L4 transverse processes. 6. 1.5 cm left  thyroid nodule. Consider further evaluation with a dedicated thyroid ultrasound if consistent with patient's goals of care. Electronically Signed   By: Lorenza Cambridge M.D.   On: 02/13/2023 16:42   CT Thoracic Spine Wo Contrast  Result Date: 02/13/2023 CLINICAL DATA:  Head trauma, minor (Age >= 65y); Neck trauma (Age >= 65y); Back trauma, no prior imaging (Age >= 16y) EXAM: CT HEAD WITHOUT CONTRAST CT CERVICAL SPINE WITHOUT CONTRAST CT THORACIC SPINE WITHOUT CONTRAST CT LUMBAR SPINE WITHOUT CONTRAST TECHNIQUE: Multidetector CT imaging of the head, cervical spine, thoracic spine, and lumbar spine was performed following the standard protocol without intravenous contrast. Multiplanar CT image reconstructions of the cervical spine were also generated. RADIATION DOSE REDUCTION: This exam was performed according to the departmental dose-optimization program which includes automated exposure control, adjustment of the mA and/or kV according to patient size and/or use of iterative reconstruction technique. COMPARISON:  CT Head and C Spine 03/20/22, CTA Chest 05/29/21, CT AP 07/03/18 FINDINGS: CT HEAD FINDINGS Brain: No hemorrhage. No hydrocephalus. No extra-axial fluid collection. No CT evidence of an acute cortical infarct. There is sequela of severe chronic microvascular ischemic change. No mass effect. No mass lesion Vascular: No hyperdense vessel or unexpected calcification. Skull: Soft tissue hematoma along the posterior scalp. No evidence of an underlying calvarial fracture. Sinuses/Orbits: No middle ear or mastoid effusion. Paranasal sinuses are clear. Bilateral lens replacement. Orbits are otherwise unremarkable. Other: None. CT CERVICAL SPINE FINDINGS Alignment: Grade 1 anterolisthesis of C4 on C5. Skull base and vertebrae: Acute nondisplaced fracture through the inferior articular process of C6 on the right (series 6, image 33) left (series 6, image 45-26). Acute nondisplaced fracture through the left lamina at C6  (series 3, image 50). Soft tissues and spinal canal: No prevertebral fluid or swelling. No visible canal hematoma. 1.5 cm left thyroid nodule. Disc levels:  No evidence of high-grade spinal canal stenosis. Upper chest: Negative. CT THORACIC SPINE FINDINGS Alignment: Normal. Vertebrae: Possible nondisplaced fractures through the T1 spinous process (series 6, image 44). Paraspinal and other soft tissues: Acute nondisplaced fractures through the right first rib (series 3, image 65). Acute fractures of the posterior left fourth-sixth ribs. There is a small amount of extrapleural blood products adjacent to the rib fractures. No pneumothorax. Disc levels: No evidence of high-grade spinal canal stenosis. CT LUMBAR SPINE FINDINGS Segmentation: 5 lumbar type vertebrae. Alignment: Normal. Vertebrae: Chronic superior endplate compression deformity at L2. acute fractures through the right L2 transverse process (series 4, image 162), L3 transverse process (series 4, image 180), L4 transverse process (series 4, image 69). Paraspinal and other soft tissues: Diverticulosis without evidence of diverticulitis. Disc levels: No evidence of high-grade spinal canal stenosis. IMPRESSION: 1. Soft tissue hematoma along the posterior scalp. No evidence of an underlying calvarial fracture or intracranial injury. 2. Acute nondisplaced fractures through the inferior articular process of C6 bilaterally. Acute nondisplaced fracture through the left lamina at C6. 3. Possible nondisplaced fractures through the T1 spinous process. 4. Acute nondisplaced fractures through the right first rib. Acute fractures of the posterior left fourth-sixth ribs. Small amount of extrapleural  blood products adjacent to the rib fractures. No pneumothorax. 5. Acute fractures through the right L2, L3, and L4 transverse processes. 6. 1.5 cm left thyroid nodule. Consider further evaluation with a dedicated thyroid ultrasound if consistent with patient's goals of care.  Electronically Signed   By: Lorenza Cambridge M.D.   On: 02/13/2023 16:42   CT Lumbar Spine Wo Contrast  Result Date: 02/13/2023 CLINICAL DATA:  Head trauma, minor (Age >= 65y); Neck trauma (Age >= 65y); Back trauma, no prior imaging (Age >= 16y) EXAM: CT HEAD WITHOUT CONTRAST CT CERVICAL SPINE WITHOUT CONTRAST CT THORACIC SPINE WITHOUT CONTRAST CT LUMBAR SPINE WITHOUT CONTRAST TECHNIQUE: Multidetector CT imaging of the head, cervical spine, thoracic spine, and lumbar spine was performed following the standard protocol without intravenous contrast. Multiplanar CT image reconstructions of the cervical spine were also generated. RADIATION DOSE REDUCTION: This exam was performed according to the departmental dose-optimization program which includes automated exposure control, adjustment of the mA and/or kV according to patient size and/or use of iterative reconstruction technique. COMPARISON:  CT Head and C Spine 03/20/22, CTA Chest 05/29/21, CT AP 07/03/18 FINDINGS: CT HEAD FINDINGS Brain: No hemorrhage. No hydrocephalus. No extra-axial fluid collection. No CT evidence of an acute cortical infarct. There is sequela of severe chronic microvascular ischemic change. No mass effect. No mass lesion Vascular: No hyperdense vessel or unexpected calcification. Skull: Soft tissue hematoma along the posterior scalp. No evidence of an underlying calvarial fracture. Sinuses/Orbits: No middle ear or mastoid effusion. Paranasal sinuses are clear. Bilateral lens replacement. Orbits are otherwise unremarkable. Other: None. CT CERVICAL SPINE FINDINGS Alignment: Grade 1 anterolisthesis of C4 on C5. Skull base and vertebrae: Acute nondisplaced fracture through the inferior articular process of C6 on the right (series 6, image 33) left (series 6, image 45-26). Acute nondisplaced fracture through the left lamina at C6 (series 3, image 50). Soft tissues and spinal canal: No prevertebral fluid or swelling. No visible canal hematoma. 1.5 cm  left thyroid nodule. Disc levels:  No evidence of high-grade spinal canal stenosis. Upper chest: Negative. CT THORACIC SPINE FINDINGS Alignment: Normal. Vertebrae: Possible nondisplaced fractures through the T1 spinous process (series 6, image 44). Paraspinal and other soft tissues: Acute nondisplaced fractures through the right first rib (series 3, image 65). Acute fractures of the posterior left fourth-sixth ribs. There is a small amount of extrapleural blood products adjacent to the rib fractures. No pneumothorax. Disc levels: No evidence of high-grade spinal canal stenosis. CT LUMBAR SPINE FINDINGS Segmentation: 5 lumbar type vertebrae. Alignment: Normal. Vertebrae: Chronic superior endplate compression deformity at L2. acute fractures through the right L2 transverse process (series 4, image 162), L3 transverse process (series 4, image 180), L4 transverse process (series 4, image 69). Paraspinal and other soft tissues: Diverticulosis without evidence of diverticulitis. Disc levels: No evidence of high-grade spinal canal stenosis. IMPRESSION: 1. Soft tissue hematoma along the posterior scalp. No evidence of an underlying calvarial fracture or intracranial injury. 2. Acute nondisplaced fractures through the inferior articular process of C6 bilaterally. Acute nondisplaced fracture through the left lamina at C6. 3. Possible nondisplaced fractures through the T1 spinous process. 4. Acute nondisplaced fractures through the right first rib. Acute fractures of the posterior left fourth-sixth ribs. Small amount of extrapleural blood products adjacent to the rib fractures. No pneumothorax. 5. Acute fractures through the right L2, L3, and L4 transverse processes. 6. 1.5 cm left thyroid nodule. Consider further evaluation with a dedicated thyroid ultrasound if consistent with patient's goals of care. Electronically  Signed   By: Lorenza Cambridge M.D.   On: 02/13/2023 16:42   DG Shoulder Left  Result Date:  02/13/2023 CLINICAL DATA:  Left shoulder pain after fall. EXAM: LEFT SHOULDER - 2+ VIEW COMPARISON:  None Available. FINDINGS: There is no evidence of fracture or dislocation. Clavicle is intact. Widening of the acromioclavicular joint without clavicular elevation. Subcortical cystic change in the lateral humeral head. Soft tissue calcifications in the region of the rotator cuff insertion. IMPRESSION: 1. No fracture or dislocation of the left shoulder. 2. Widening of the acromioclavicular joint without clavicular elevation, may be sequela of remote injury or prior surgery. 3. Soft tissue calcifications in the region of the rotator cuff insertion may represent calcific tendinopathy. Electronically Signed   By: Narda Rutherford M.D.   On: 02/13/2023 16:02   DG Tibia/Fibula Left  Result Date: 02/13/2023 CLINICAL DATA:  Left lower leg pain after fall. EXAM: LEFT TIBIA AND FIBULA - 2 VIEW COMPARISON:  None Available. FINDINGS: There is no evidence of fracture or other focal bone lesions. Knee arthroplasty is intact where visualized. No ankle dislocation. Chronic vascular calcifications. IMPRESSION: No fracture of the left tibia or fibula. Electronically Signed   By: Narda Rutherford M.D.   On: 02/13/2023 16:01    Microbiology: Results for orders placed or performed during the hospital encounter of 08/08/21  Surgical pcr screen     Status: None   Collection Time: 08/09/21 12:06 AM   Specimen: Nasal Mucosa; Nasal Swab  Result Value Ref Range Status   MRSA, PCR NEGATIVE NEGATIVE Final   Staphylococcus aureus NEGATIVE NEGATIVE Final    Comment: (NOTE) The Xpert SA Assay (FDA approved for NASAL specimens in patients 72 years of age and older), is one component of a comprehensive surveillance program. It is not intended to diagnose infection nor to guide or monitor treatment. Performed at [facility], [address], [place], [place] [address]     Labs: CBC: Recent Labs  Lab 02/13/23 1714  02/14/23 0546 02/15/23 0524  WBC 11.9* 6.5 7.1  NEUTROABS 10.3*  --   --   HGB 14.2 13.2 13.8  HCT 43.7 39.3 41.2  MCV 97.5 94.0 95.8  PLT 242 227 239   Basic Metabolic Panel: Recent Labs  Lab 02/13/23 1714 02/14/23 0546 02/15/23 0524  NA 135 139 138  K 4.2 3.9 3.6  CL 98 103 101  CO2 25 29 28   GLUCOSE 112* 105* 98  BUN 14 14 13   CREATININE 0.44 0.48 0.53  CALCIUM 9.2 8.6* 8.7*  MG  --   --  2.0  PHOS  --   --  3.3   Liver Function Tests: No results for input(s): "AST", "ALT", "ALKPHOS", "BILITOT", "PROT", "ALBUMIN" in the last 168 hours. CBG: No results for input(s): "GLUCAP" in the last 168 hours.  Discharge time spent: less than 30 minutes.  Signed: Pennie Banter, DO [facility] 02/18/2023

## 2023-02-18 NOTE — Plan of Care (Signed)
       Problem: Education: Goal: Knowledge of General Education information will improve Description: Including pain rating scale, medication(s)/side effects and non-pharmacologic comfort measures Outcome: Completed/Met   Problem: Health Behavior/Discharge Planning: Goal: Ability to manage health-related needs will improve Outcome: Completed/Met   Problem: Clinical Measurements: Goal: Ability to maintain clinical measurements within normal limits will improve Outcome: Completed/Met Goal: Will remain free from infection Outcome: Completed/Met Goal: Diagnostic test results will improve Outcome: Completed/Met Goal: Respiratory complications will improve Outcome: Completed/Met Goal: Cardiovascular complication will be avoided Outcome: Completed/Met   Problem: Activity: Goal: Risk for activity intolerance will decrease Outcome: Completed/Met   Problem: Nutrition: Goal: Adequate nutrition will be maintained Outcome: Completed/Met   Problem: Coping: Goal: Level of anxiety will decrease Outcome: Completed/Met   Problem: Elimination: Goal: Will not experience complications related to bowel motility Outcome: Completed/Met Goal: Will not experience complications related to urinary retention Outcome: Completed/Met   Problem: Pain Managment: Goal: General experience of comfort will improve Outcome: Completed/Met   Problem: Safety: Goal: Ability to remain free from injury will improve Outcome: Completed/Met   Problem: Skin Integrity: Goal: Risk for impaired skin integrity will decrease Outcome: Completed/Met   Problem: Education: Goal: Knowledge of General Education information will improve Description: Including pain rating scale, medication(s)/side effects and non-pharmacologic comfort measures Outcome: Completed/Met   Problem: Health Behavior/Discharge Planning: Goal: Ability to manage health-related needs will improve Outcome: Completed/Met   Problem: Clinical  Measurements: Goal: Ability to maintain clinical measurements within normal limits will improve Outcome: Completed/Met   Problem: Clinical Measurements: Goal: Will remain free from infection Outcome: Completed/Met   Problem: Activity: Goal: Risk for activity intolerance will decrease Outcome: Completed/Met

## 2023-02-24 DIAGNOSIS — Z86718 Personal history of other venous thrombosis and embolism: Secondary | ICD-10-CM | POA: Diagnosis not present

## 2023-02-24 DIAGNOSIS — D72829 Elevated white blood cell count, unspecified: Secondary | ICD-10-CM | POA: Diagnosis not present

## 2023-02-24 DIAGNOSIS — Z86711 Personal history of pulmonary embolism: Secondary | ICD-10-CM | POA: Diagnosis not present

## 2023-02-24 DIAGNOSIS — E538 Deficiency of other specified B group vitamins: Secondary | ICD-10-CM | POA: Diagnosis not present

## 2023-02-24 DIAGNOSIS — J90 Pleural effusion, not elsewhere classified: Secondary | ICD-10-CM | POA: Diagnosis not present

## 2023-02-24 DIAGNOSIS — Z96653 Presence of artificial knee joint, bilateral: Secondary | ICD-10-CM | POA: Diagnosis not present

## 2023-02-24 DIAGNOSIS — I8393 Asymptomatic varicose veins of bilateral lower extremities: Secondary | ICD-10-CM | POA: Diagnosis not present

## 2023-02-24 DIAGNOSIS — Z9049 Acquired absence of other specified parts of digestive tract: Secondary | ICD-10-CM | POA: Diagnosis not present

## 2023-02-24 DIAGNOSIS — Z602 Problems related to living alone: Secondary | ICD-10-CM | POA: Diagnosis not present

## 2023-02-24 DIAGNOSIS — Z9181 History of falling: Secondary | ICD-10-CM | POA: Diagnosis not present

## 2023-02-24 DIAGNOSIS — Z96611 Presence of right artificial shoulder joint: Secondary | ICD-10-CM | POA: Diagnosis not present

## 2023-02-24 DIAGNOSIS — S0003XD Contusion of scalp, subsequent encounter: Secondary | ICD-10-CM | POA: Diagnosis not present

## 2023-02-24 DIAGNOSIS — M8008XD Age-related osteoporosis with current pathological fracture, vertebra(e), subsequent encounter for fracture with routine healing: Secondary | ICD-10-CM | POA: Diagnosis not present

## 2023-02-26 DIAGNOSIS — Z86718 Personal history of other venous thrombosis and embolism: Secondary | ICD-10-CM | POA: Diagnosis not present

## 2023-02-26 DIAGNOSIS — R600 Localized edema: Secondary | ICD-10-CM | POA: Diagnosis not present

## 2023-02-26 DIAGNOSIS — I10 Essential (primary) hypertension: Secondary | ICD-10-CM | POA: Diagnosis not present

## 2023-02-26 DIAGNOSIS — Z23 Encounter for immunization: Secondary | ICD-10-CM | POA: Diagnosis not present

## 2023-02-26 DIAGNOSIS — E7800 Pure hypercholesterolemia, unspecified: Secondary | ICD-10-CM | POA: Diagnosis not present

## 2023-02-27 DIAGNOSIS — Z9181 History of falling: Secondary | ICD-10-CM | POA: Diagnosis not present

## 2023-02-27 DIAGNOSIS — Z9049 Acquired absence of other specified parts of digestive tract: Secondary | ICD-10-CM | POA: Diagnosis not present

## 2023-02-27 DIAGNOSIS — I8393 Asymptomatic varicose veins of bilateral lower extremities: Secondary | ICD-10-CM | POA: Diagnosis not present

## 2023-02-27 DIAGNOSIS — E538 Deficiency of other specified B group vitamins: Secondary | ICD-10-CM | POA: Diagnosis not present

## 2023-02-27 DIAGNOSIS — S0003XD Contusion of scalp, subsequent encounter: Secondary | ICD-10-CM | POA: Diagnosis not present

## 2023-02-27 DIAGNOSIS — Z86718 Personal history of other venous thrombosis and embolism: Secondary | ICD-10-CM | POA: Diagnosis not present

## 2023-02-27 DIAGNOSIS — M8008XD Age-related osteoporosis with current pathological fracture, vertebra(e), subsequent encounter for fracture with routine healing: Secondary | ICD-10-CM | POA: Diagnosis not present

## 2023-02-27 DIAGNOSIS — Z96653 Presence of artificial knee joint, bilateral: Secondary | ICD-10-CM | POA: Diagnosis not present

## 2023-02-27 DIAGNOSIS — D72829 Elevated white blood cell count, unspecified: Secondary | ICD-10-CM | POA: Diagnosis not present

## 2023-02-27 DIAGNOSIS — Z86711 Personal history of pulmonary embolism: Secondary | ICD-10-CM | POA: Diagnosis not present

## 2023-02-27 DIAGNOSIS — J90 Pleural effusion, not elsewhere classified: Secondary | ICD-10-CM | POA: Diagnosis not present

## 2023-02-27 DIAGNOSIS — Z602 Problems related to living alone: Secondary | ICD-10-CM | POA: Diagnosis not present

## 2023-02-27 DIAGNOSIS — Z96611 Presence of right artificial shoulder joint: Secondary | ICD-10-CM | POA: Diagnosis not present

## 2023-02-27 NOTE — Telephone Encounter (Signed)
He confirmed for next Tuesday at 2:30pm

## 2023-03-03 DIAGNOSIS — D849 Immunodeficiency, unspecified: Secondary | ICD-10-CM | POA: Diagnosis not present

## 2023-03-03 DIAGNOSIS — I700 Atherosclerosis of aorta: Secondary | ICD-10-CM | POA: Diagnosis not present

## 2023-03-03 DIAGNOSIS — I10 Essential (primary) hypertension: Secondary | ICD-10-CM | POA: Diagnosis not present

## 2023-03-03 DIAGNOSIS — R296 Repeated falls: Secondary | ICD-10-CM | POA: Diagnosis not present

## 2023-03-03 DIAGNOSIS — S12591S Other nondisplaced fracture of sixth cervical vertebra, sequela: Secondary | ICD-10-CM | POA: Diagnosis not present

## 2023-03-03 DIAGNOSIS — Z86718 Personal history of other venous thrombosis and embolism: Secondary | ICD-10-CM | POA: Diagnosis not present

## 2023-03-03 DIAGNOSIS — E041 Nontoxic single thyroid nodule: Secondary | ICD-10-CM | POA: Diagnosis not present

## 2023-03-03 DIAGNOSIS — M0609 Rheumatoid arthritis without rheumatoid factor, multiple sites: Secondary | ICD-10-CM | POA: Diagnosis not present

## 2023-03-03 DIAGNOSIS — E7800 Pure hypercholesterolemia, unspecified: Secondary | ICD-10-CM | POA: Diagnosis not present

## 2023-03-04 ENCOUNTER — Ambulatory Visit (INDEPENDENT_AMBULATORY_CARE_PROVIDER_SITE_OTHER): Payer: No Typology Code available for payment source | Admitting: Neurosurgery

## 2023-03-04 ENCOUNTER — Ambulatory Visit: Payer: No Typology Code available for payment source | Admitting: Neurosurgery

## 2023-03-04 DIAGNOSIS — S12500A Unspecified displaced fracture of sixth cervical vertebra, initial encounter for closed fracture: Secondary | ICD-10-CM

## 2023-03-04 DIAGNOSIS — S12500D Unspecified displaced fracture of sixth cervical vertebra, subsequent encounter for fracture with routine healing: Secondary | ICD-10-CM

## 2023-03-04 NOTE — Progress Notes (Signed)
I had a long conversation with the patient's son and healthcare power of attorney, Kylan Gehman this afternoon, in anticipation of his mom's upcoming appointment next week.  He wanted to give me some information regarding her history.  She has had multiple falls resulting in fractures over the last 4 to 5 years several being within her own home.  She continues to live alone and feels very strongly about remaining independent.  She continues to drive at this time.  She is a patient of Dr. Margaretmary Eddy and he has placed some limitations on her driving.  After her recent fall she was seen by home health physical therapy who has recently cleared her to walk with a cane.  He will be attending her upcoming appointment as well.

## 2023-03-06 DIAGNOSIS — M8008XD Age-related osteoporosis with current pathological fracture, vertebra(e), subsequent encounter for fracture with routine healing: Secondary | ICD-10-CM | POA: Diagnosis not present

## 2023-03-06 DIAGNOSIS — E538 Deficiency of other specified B group vitamins: Secondary | ICD-10-CM | POA: Diagnosis not present

## 2023-03-06 DIAGNOSIS — Z96611 Presence of right artificial shoulder joint: Secondary | ICD-10-CM | POA: Diagnosis not present

## 2023-03-06 DIAGNOSIS — Z9049 Acquired absence of other specified parts of digestive tract: Secondary | ICD-10-CM | POA: Diagnosis not present

## 2023-03-06 DIAGNOSIS — Z86718 Personal history of other venous thrombosis and embolism: Secondary | ICD-10-CM | POA: Diagnosis not present

## 2023-03-06 DIAGNOSIS — Z9181 History of falling: Secondary | ICD-10-CM | POA: Diagnosis not present

## 2023-03-06 DIAGNOSIS — Z602 Problems related to living alone: Secondary | ICD-10-CM | POA: Diagnosis not present

## 2023-03-06 DIAGNOSIS — I8393 Asymptomatic varicose veins of bilateral lower extremities: Secondary | ICD-10-CM | POA: Diagnosis not present

## 2023-03-06 DIAGNOSIS — S0003XD Contusion of scalp, subsequent encounter: Secondary | ICD-10-CM | POA: Diagnosis not present

## 2023-03-06 DIAGNOSIS — D72829 Elevated white blood cell count, unspecified: Secondary | ICD-10-CM | POA: Diagnosis not present

## 2023-03-06 DIAGNOSIS — Z96653 Presence of artificial knee joint, bilateral: Secondary | ICD-10-CM | POA: Diagnosis not present

## 2023-03-06 DIAGNOSIS — Z86711 Personal history of pulmonary embolism: Secondary | ICD-10-CM | POA: Diagnosis not present

## 2023-03-06 DIAGNOSIS — J90 Pleural effusion, not elsewhere classified: Secondary | ICD-10-CM | POA: Diagnosis not present

## 2023-03-07 DIAGNOSIS — Z9049 Acquired absence of other specified parts of digestive tract: Secondary | ICD-10-CM | POA: Diagnosis not present

## 2023-03-07 DIAGNOSIS — Z86711 Personal history of pulmonary embolism: Secondary | ICD-10-CM | POA: Diagnosis not present

## 2023-03-07 DIAGNOSIS — Z9181 History of falling: Secondary | ICD-10-CM | POA: Diagnosis not present

## 2023-03-07 DIAGNOSIS — M8008XD Age-related osteoporosis with current pathological fracture, vertebra(e), subsequent encounter for fracture with routine healing: Secondary | ICD-10-CM | POA: Diagnosis not present

## 2023-03-07 DIAGNOSIS — S0003XD Contusion of scalp, subsequent encounter: Secondary | ICD-10-CM | POA: Diagnosis not present

## 2023-03-07 DIAGNOSIS — E538 Deficiency of other specified B group vitamins: Secondary | ICD-10-CM | POA: Diagnosis not present

## 2023-03-07 DIAGNOSIS — D72829 Elevated white blood cell count, unspecified: Secondary | ICD-10-CM | POA: Diagnosis not present

## 2023-03-07 DIAGNOSIS — J90 Pleural effusion, not elsewhere classified: Secondary | ICD-10-CM | POA: Diagnosis not present

## 2023-03-07 DIAGNOSIS — Z96653 Presence of artificial knee joint, bilateral: Secondary | ICD-10-CM | POA: Diagnosis not present

## 2023-03-07 DIAGNOSIS — I8393 Asymptomatic varicose veins of bilateral lower extremities: Secondary | ICD-10-CM | POA: Diagnosis not present

## 2023-03-07 DIAGNOSIS — Z86718 Personal history of other venous thrombosis and embolism: Secondary | ICD-10-CM | POA: Diagnosis not present

## 2023-03-07 DIAGNOSIS — Z96611 Presence of right artificial shoulder joint: Secondary | ICD-10-CM | POA: Diagnosis not present

## 2023-03-07 DIAGNOSIS — Z602 Problems related to living alone: Secondary | ICD-10-CM | POA: Diagnosis not present

## 2023-03-11 DIAGNOSIS — K219 Gastro-esophageal reflux disease without esophagitis: Secondary | ICD-10-CM | POA: Diagnosis not present

## 2023-03-11 DIAGNOSIS — M8008XD Age-related osteoporosis with current pathological fracture, vertebra(e), subsequent encounter for fracture with routine healing: Secondary | ICD-10-CM | POA: Diagnosis not present

## 2023-03-11 DIAGNOSIS — M359 Systemic involvement of connective tissue, unspecified: Secondary | ICD-10-CM | POA: Diagnosis not present

## 2023-03-11 DIAGNOSIS — I70209 Unspecified atherosclerosis of native arteries of extremities, unspecified extremity: Secondary | ICD-10-CM | POA: Diagnosis not present

## 2023-03-11 DIAGNOSIS — M0609 Rheumatoid arthritis without rheumatoid factor, multiple sites: Secondary | ICD-10-CM | POA: Diagnosis not present

## 2023-03-11 DIAGNOSIS — M800AXD Age-related osteoporosis with current pathological fracture, other site, subsequent encounter for fracture with routine healing: Secondary | ICD-10-CM | POA: Diagnosis not present

## 2023-03-11 DIAGNOSIS — I10 Essential (primary) hypertension: Secondary | ICD-10-CM | POA: Diagnosis not present

## 2023-03-12 DIAGNOSIS — M0609 Rheumatoid arthritis without rheumatoid factor, multiple sites: Secondary | ICD-10-CM | POA: Diagnosis not present

## 2023-03-12 NOTE — Progress Notes (Unsigned)
Referring Physician:  Lynnea Ferrier, MD [address] [facility] [place],  [place] [address]  Primary Physician:  Lynnea Ferrier, MD  History of Present Illness: 03/13/2023 Teresa Lin is an 89 year old who is here today for hospital follow-up after a fall resulting in C6 fracture which was treated conservatively in a collar.  She presents today reporting compliance with her cervical collar.  She has not experienced any neck pain or radiating arm pain or new weakness.  She does endorse some soreness in her left chest wall but states that this is improving and attributes this to her rib fractures.  02/13/23 consult note Teresa Lin is a 89 y.o female presenting to the emergency department on 02/13/2023 after tripping and falling in her yard.  She was on her porch and walking down approximately 5 steps when she slipped and fell.  She denies any loss of consciousness. Imaging in the ED revealed right L2-4 TP fractures, possible T1 spinous process fracture, bilateral C6 inferior articular process fractures, and left C6 laminar fracture. Today she reports some pain in her back with leg movement but denies any radiating leg pain.  She also endorses some neck pain with movement without any radiculopathy.  Teresa Lin has no symptoms of cervical myelopathy.  The symptoms are causing a significant impact on the patient's life.   Review of Systems:  A 10 point review of systems is negative, except for the pertinent positives and negatives detailed in the HPI.  Past Medical History: Past Medical History:  Diagnosis Date   Anemia    Anxiety    Arthritis    osteoarthritis. Bilateral knee replacement, hands, meniscal tear, cervical disc disease   Atherosclerosis    Atypical chest pain    Collagen vascular disease (HCC)    Colon polyp 12/15/2017   Colon polyps    Compression fracture of L2 (HCC)    Diverticulosis    DVT (deep venous thrombosis) (HCC)    Fracture of one  rib, right side, subsequent encounter for fracture with routine healing 03/08/2017   GERD (gastroesophageal reflux disease)    History of seronegative inflammatory arthritis    HOH (hard of hearing)    no hearing aids   Hypertension    IBS (irritable bowel syndrome)    Internal hemorrhoids    Phlebitis and thrombophlebitis of deep veins of lower extremities (HCC)    after surgery   Vision abnormalities     Past Surgical History: Past Surgical History:  Procedure Laterality Date   APPENDECTOMY     COLONOSCOPY N/A 01/24/2020   Procedure: COLONOSCOPY;  Surgeon: Regis Bill, MD;  Location: [facility] ENDOSCOPY;  Service: Endoscopy;  Laterality: N/A;   COLONOSCOPY WITH PROPOFOL N/A 10/24/2017   Procedure: COLONOSCOPY WITH PROPOFOL;  Surgeon: Scot Jun, MD;  Location: [facility] ENDOSCOPY;  Service: Endoscopy;  Laterality: N/A;   COLONOSCOPY WITH PROPOFOL N/A 11/21/2017   Procedure: COLONOSCOPY WITH PROPOFOL;  Surgeon: Scot Jun, MD;  Location: [facility] ENDOSCOPY;  Service: Endoscopy;  Laterality: N/A;   DILATION AND CURETTAGE OF UTERUS     ESOPHAGOGASTRODUODENOSCOPY (EGD) WITH PROPOFOL N/A 02/25/2018   Procedure: ESOPHAGOGASTRODUODENOSCOPY (EGD) WITH PROPOFOL;  Surgeon: Scot Jun, MD;  Location: [facility] ENDOSCOPY;  Service: Endoscopy;  Laterality: N/A;   EYE SURGERY     bilateral catarACT   JOINT REPLACEMENT Bilateral    knee replacement   LAPAROSCOPIC RIGHT COLECTOMY Right 12/15/2017   Procedure: LAPAROSCOPIC RIGHT COLECTOMY;  Surgeon:  Earline Mayotte, MD;  Location: [facility] ORS;  Service: General;  Laterality: Right;   ORIF HUMERUS FRACTURE Right 08/09/2021   Procedure: IRRIGATION AND DEBRIDEMENT; OPEN REDUCTION INTERNAL FIXATION (ORIF) DISTAL HUMERUS FRACTURE;  Surgeon: Myrene Galas, MD;  Location: [facility] OR;  Service: Orthopedics;  Laterality: Right;   REPLACEMENT TOTAL KNEE BILATERAL     TOTAL SHOULDER ARTHROPLASTY Right 09/24/2018   Procedure: TOTAL SHOULDER ARTHROPLASTY -  REVERSE RIGHT;  Surgeon: Christena Flake, MD;  Location: [facility] ORS;  Service: Orthopedics;  Laterality: Right;    Allergies: Allergies as of 03/13/2023 - Review Complete 03/13/2023  Allergen Reaction Noted   Infliximab Other (See Comments) 01/10/2014   Lidocaine-menthol  07/25/2020   Synvisc [hylan g-f 20] Swelling 03/02/2013    Medications: Outpatient Encounter Medications as of 03/13/2023  Medication Sig   apixaban (ELIQUIS) 5 MG TABS tablet Take 5 mg by mouth 2 (two) times daily.   calcium-vitamin D (OSCAL WITH D) 500-200 MG-UNIT per tablet Take 1 tablet by mouth daily.    Cholecalciferol 50 MCG (2000 UT) TABS Take by mouth.   donepezil (ARICEPT) 5 MG tablet Take 5 mg by mouth at bedtime.   folic acid (FOLVITE) 1 MG tablet Take 2 mg by mouth daily.    hydroxychloroquine (PLAQUENIL) 200 MG tablet Take 200 mg by mouth See admin instructions. Take 2 tablets (400mg ) by mouth every Monday, Wednesday and Friday and take 1 tablet (200mg ) by mouth every Tuesday, Thursday, Saturday and Sunday   methotrexate (50 MG/ML) 1 g injection Inject 20 mg into the vein once a week.   Multiple Vitamins-Minerals (MULTIVITAMIN WITH MINERALS) tablet Take 1 tablet by mouth daily. Centrum Silver   Polyethyl Glycol-Propyl Glycol 0.4-0.3 % SOLN Place 1 drop into both eyes 3 (three) times daily as needed (for eye burning/irritation).   [DISCONTINUED] acetaminophen (TYLENOL) 500 MG tablet Take 2 tablets (1,000 mg total) by mouth every 8 (eight) hours.   [DISCONTINUED] docusate sodium (COLACE) 100 MG capsule Take 1 capsule (100 mg total) by mouth 2 (two) times daily as needed for mild constipation.   [DISCONTINUED] lidocaine (XYLOCAINE) 5 % ointment Apply 1 Application topically 3 (three) times daily as needed. To pain rib cage area   No facility-administered encounter medications on file as of 03/13/2023.    Social History: Social History   Tobacco Use   Smoking status: Never   Smokeless tobacco: Never  Vaping  Use   Vaping status: Never Used  Substance Use Topics   Alcohol use: Yes    Alcohol/week: 2.0 standard drinks of alcohol    Types: 2 Cans of beer per week    Comment: occasionally    Drug use: No    Family Medical History: Family History  Problem Relation Age of Onset   Hypertension Mother    Cancer Mother 38       colon ca   Heart attack Mother    Hypertension Father    Heart attack Father    Hypertension Sister    Heart attack Sister    Multiple sclerosis Daughter    Multiple sclerosis Son    Multiple sclerosis Son     Physical Examination: Today's Vitals   03/13/23 0937  BP: 116/82  Weight: 62.1 kg  Height: 5\' 6"  (1.676 m)  PainSc: 0-No pain   Body mass index is 22.11 kg/m.  General: Patient is well developed, well nourished, calm, collected, and in no apparent distress. Attention to examination is appropriate.  Psychiatric: Patient is non-anxious.  Head:  Pupils equal, round, and reactive to light.  ENT:  Oral mucosa appears well hydrated.  Neck:   Supple.   Respiratory: Patient is breathing without any difficulty.  Extremities: No edema.  Vascular: Palpable dorsal pedal pulses.  Skin:   On exposed skin, there are no abnormal skin lesions.  NEUROLOGICAL:     Awake, alert, oriented to person, place, and time.  Speech is clear and fluent. Fund of knowledge is appropriate.   Cranial Nerves: Pupils equal round and reactive to light.  Facial tone is symmetric.  Facial sensation is symmetric.  ROM of spine: presents in cervical collar     Strength: Moves all extremities well without obvious deficit. Bilateral upper and lower extremity sensation is intact to light touch.    Gait: Ambulates well with a single prong cane No evidence of dysmetria noted.  Medical Decision Making  Imaging: CT C spine  Alignment: Grade 1 anterolisthesis of C4 on C5.   Skull base and vertebrae: Acute nondisplaced fracture through the inferior articular process of C6  on the right (series 6, image 33) left (series 6, image 45-26). Acute nondisplaced fracture through the left lamina at C6 (series 3, image 50).   Soft tissues and spinal canal: No prevertebral fluid or swelling. No visible canal hematoma. 1.5 cm left thyroid nodule.   Disc levels:  No evidence of high-grade spinal canal stenosis.   Upper chest: Negative.   CT THORACIC SPINE FINDINGS   Alignment: Normal.   Vertebrae: Possible nondisplaced fractures through the T1 spinous process (series 6, image 44).   Paraspinal and other soft tissues: Acute nondisplaced fractures through the right first rib (series 3, image 65). Acute fractures of the posterior left fourth-sixth ribs. There is a small amount of extrapleural blood products adjacent to the rib fractures. No pneumothorax.   Disc levels: No evidence of high-grade spinal canal stenosis.   CT LUMBAR SPINE FINDINGS   Segmentation: 5 lumbar type vertebrae.   Alignment: Normal.   Vertebrae: Chronic superior endplate compression deformity at L2. acute fractures through the right L2 transverse process (series 4, image 162), L3 transverse process (series 4, image 180), L4 transverse process (series 4, image 69).   Paraspinal and other soft tissues: Diverticulosis without evidence of diverticulitis.   Disc levels: No evidence of high-grade spinal canal stenosis.   IMPRESSION: 1. Soft tissue hematoma along the posterior scalp. No evidence of an underlying calvarial fracture or intracranial injury. 2. Acute nondisplaced fractures through the inferior articular process of C6 bilaterally. Acute nondisplaced fracture through the left lamina at C6. 3. Possible nondisplaced fractures through the T1 spinous process. 4. Acute nondisplaced fractures through the right first rib. Acute fractures of the posterior left fourth-sixth ribs. Small amount of extrapleural blood products adjacent to the rib fractures. No pneumothorax. 5. Acute  fractures through the right L2, L3, and L4 transverse processes. 6. 1.5 cm left thyroid nodule. Consider further evaluation with a dedicated thyroid ultrasound if consistent with patient's goals of care.     Electronically Signed   By: Lorenza Cambridge M.D.   On: 02/13/2023 16:42  I have personally reviewed the images and agree with the above interpretation.  Assessment and Plan: Ms. Kenaston is a pleasant 89 y.o. female with a history of falls status post fall earlier this month resulting in a C6 fracture.  She has been treated conservatively in a neck brace.  We will continue her cervical collar at this time.  She did not get x-rays  prior to her visit today and will go for these after.  I will send her a MyChart message with the results and further plan after completion.  I would likely see her back in 6 to 8 weeks and we will defer x-rays until after this visit.  She was encouraged to call the office in the interim with any questions or concerns.  She expressed understanding and was in agreement with this plan.   Thank you for involving me in the care of this patient.   I spent a total of 38 minutes in both face-to-face and non-face-to-face activities for this visit on the date of this encounter including review of imaging, review of diagnosis, review of treatment plan, physical exam, and documentation.   [facility]

## 2023-03-13 ENCOUNTER — Ambulatory Visit
Admission: RE | Admit: 2023-03-13 | Discharge: 2023-03-13 | Disposition: A | Discharge status: Home / Self Care | Payer: No Typology Code available for payment source | Attending: Neurosurgery | Admitting: Neurosurgery

## 2023-03-13 ENCOUNTER — Encounter: Payer: Self-pay | Admitting: Neurosurgery

## 2023-03-13 ENCOUNTER — Ambulatory Visit
Admission: RE | Admit: 2023-03-13 | Discharge: 2023-03-13 | Disposition: A | Discharge status: Home / Self Care | Payer: No Typology Code available for payment source | Source: Ambulatory Visit | Attending: Neurosurgery | Admitting: Neurosurgery

## 2023-03-13 ENCOUNTER — Ambulatory Visit: Payer: No Typology Code available for payment source | Admitting: Neurosurgery

## 2023-03-13 VITALS — BP 116/82 | Ht 66.0 in | Wt 137.0 lb

## 2023-03-13 DIAGNOSIS — W19XXXD Unspecified fall, subsequent encounter: Secondary | ICD-10-CM | POA: Diagnosis not present

## 2023-03-13 DIAGNOSIS — S12500D Unspecified displaced fracture of sixth cervical vertebra, subsequent encounter for fracture with routine healing: Secondary | ICD-10-CM | POA: Diagnosis not present

## 2023-03-13 DIAGNOSIS — S12500A Unspecified displaced fracture of sixth cervical vertebra, initial encounter for closed fracture: Secondary | ICD-10-CM

## 2023-03-13 DIAGNOSIS — S199XXA Unspecified injury of neck, initial encounter: Secondary | ICD-10-CM | POA: Diagnosis not present

## 2023-03-13 DIAGNOSIS — M4802 Spinal stenosis, cervical region: Secondary | ICD-10-CM | POA: Diagnosis not present

## 2023-03-13 DIAGNOSIS — M47812 Spondylosis without myelopathy or radiculopathy, cervical region: Secondary | ICD-10-CM | POA: Diagnosis not present

## 2023-03-13 DIAGNOSIS — M5030 Other cervical disc degeneration, unspecified cervical region: Secondary | ICD-10-CM | POA: Diagnosis not present

## 2023-03-18 DIAGNOSIS — Z86718 Personal history of other venous thrombosis and embolism: Secondary | ICD-10-CM | POA: Diagnosis not present

## 2023-03-18 DIAGNOSIS — Z602 Problems related to living alone: Secondary | ICD-10-CM | POA: Diagnosis not present

## 2023-03-18 DIAGNOSIS — Z96611 Presence of right artificial shoulder joint: Secondary | ICD-10-CM | POA: Diagnosis not present

## 2023-03-18 DIAGNOSIS — S0003XD Contusion of scalp, subsequent encounter: Secondary | ICD-10-CM | POA: Diagnosis not present

## 2023-03-18 DIAGNOSIS — Z96653 Presence of artificial knee joint, bilateral: Secondary | ICD-10-CM | POA: Diagnosis not present

## 2023-03-18 DIAGNOSIS — E538 Deficiency of other specified B group vitamins: Secondary | ICD-10-CM | POA: Diagnosis not present

## 2023-03-18 DIAGNOSIS — D72829 Elevated white blood cell count, unspecified: Secondary | ICD-10-CM | POA: Diagnosis not present

## 2023-03-18 DIAGNOSIS — Z9049 Acquired absence of other specified parts of digestive tract: Secondary | ICD-10-CM | POA: Diagnosis not present

## 2023-03-18 DIAGNOSIS — I8393 Asymptomatic varicose veins of bilateral lower extremities: Secondary | ICD-10-CM | POA: Diagnosis not present

## 2023-03-18 DIAGNOSIS — J90 Pleural effusion, not elsewhere classified: Secondary | ICD-10-CM | POA: Diagnosis not present

## 2023-03-18 DIAGNOSIS — Z86711 Personal history of pulmonary embolism: Secondary | ICD-10-CM | POA: Diagnosis not present

## 2023-03-18 DIAGNOSIS — Z9181 History of falling: Secondary | ICD-10-CM | POA: Diagnosis not present

## 2023-03-18 DIAGNOSIS — M8008XD Age-related osteoporosis with current pathological fracture, vertebra(e), subsequent encounter for fracture with routine healing: Secondary | ICD-10-CM | POA: Diagnosis not present

## 2023-03-26 DIAGNOSIS — K2950 Unspecified chronic gastritis without bleeding: Secondary | ICD-10-CM | POA: Diagnosis not present

## 2023-03-26 DIAGNOSIS — E7800 Pure hypercholesterolemia, unspecified: Secondary | ICD-10-CM | POA: Diagnosis not present

## 2023-03-26 DIAGNOSIS — M0609 Rheumatoid arthritis without rheumatoid factor, multiple sites: Secondary | ICD-10-CM | POA: Diagnosis not present

## 2023-03-26 DIAGNOSIS — Z23 Encounter for immunization: Secondary | ICD-10-CM | POA: Diagnosis not present

## 2023-03-26 DIAGNOSIS — R311 Benign essential microscopic hematuria: Secondary | ICD-10-CM | POA: Diagnosis not present

## 2023-03-26 DIAGNOSIS — I700 Atherosclerosis of aorta: Secondary | ICD-10-CM | POA: Diagnosis not present

## 2023-03-26 DIAGNOSIS — I10 Essential (primary) hypertension: Secondary | ICD-10-CM | POA: Diagnosis not present

## 2023-03-26 DIAGNOSIS — Z0000 Encounter for general adult medical examination without abnormal findings: Secondary | ICD-10-CM | POA: Diagnosis not present

## 2023-03-26 DIAGNOSIS — D849 Immunodeficiency, unspecified: Secondary | ICD-10-CM | POA: Diagnosis not present

## 2023-03-26 DIAGNOSIS — R7303 Prediabetes: Secondary | ICD-10-CM | POA: Diagnosis not present

## 2023-03-26 DIAGNOSIS — Z86718 Personal history of other venous thrombosis and embolism: Secondary | ICD-10-CM | POA: Diagnosis not present

## 2023-04-09 DIAGNOSIS — M0609 Rheumatoid arthritis without rheumatoid factor, multiple sites: Secondary | ICD-10-CM | POA: Diagnosis not present

## 2023-04-23 DIAGNOSIS — Z79899 Other long term (current) drug therapy: Secondary | ICD-10-CM | POA: Diagnosis not present

## 2023-04-23 DIAGNOSIS — M0609 Rheumatoid arthritis without rheumatoid factor, multiple sites: Secondary | ICD-10-CM | POA: Diagnosis not present

## 2023-04-24 ENCOUNTER — Ambulatory Visit: Payer: No Typology Code available for payment source | Admitting: Neurosurgery

## 2023-04-28 ENCOUNTER — Other Ambulatory Visit: Payer: Self-pay

## 2023-04-28 DIAGNOSIS — S12500A Unspecified displaced fracture of sixth cervical vertebra, initial encounter for closed fracture: Secondary | ICD-10-CM

## 2023-04-29 ENCOUNTER — Ambulatory Visit
Admission: RE | Admit: 2023-04-29 | Discharge: 2023-04-29 | Disposition: A | Discharge status: Home / Self Care | Payer: No Typology Code available for payment source | Source: Ambulatory Visit | Attending: Neurosurgery | Admitting: Neurosurgery

## 2023-04-29 ENCOUNTER — Ambulatory Visit
Admission: RE | Admit: 2023-04-29 | Discharge: 2023-04-29 | Disposition: A | Discharge status: Home / Self Care | Payer: No Typology Code available for payment source | Attending: Neurosurgery | Admitting: Neurosurgery

## 2023-04-29 ENCOUNTER — Ambulatory Visit (INDEPENDENT_AMBULATORY_CARE_PROVIDER_SITE_OTHER): Payer: No Typology Code available for payment source | Admitting: Neurosurgery

## 2023-04-29 VITALS — BP 146/60 | Ht 66.0 in | Wt 135.8 lb

## 2023-04-29 DIAGNOSIS — S12300A Unspecified displaced fracture of fourth cervical vertebra, initial encounter for closed fracture: Secondary | ICD-10-CM | POA: Diagnosis not present

## 2023-04-29 DIAGNOSIS — S12500A Unspecified displaced fracture of sixth cervical vertebra, initial encounter for closed fracture: Secondary | ICD-10-CM

## 2023-04-29 DIAGNOSIS — S12500D Unspecified displaced fracture of sixth cervical vertebra, subsequent encounter for fracture with routine healing: Secondary | ICD-10-CM | POA: Diagnosis not present

## 2023-04-29 DIAGNOSIS — S12400A Unspecified displaced fracture of fifth cervical vertebra, initial encounter for closed fracture: Secondary | ICD-10-CM | POA: Diagnosis not present

## 2023-04-29 DIAGNOSIS — W19XXXD Unspecified fall, subsequent encounter: Secondary | ICD-10-CM | POA: Diagnosis not present

## 2023-04-29 DIAGNOSIS — M4312 Spondylolisthesis, cervical region: Secondary | ICD-10-CM | POA: Diagnosis not present

## 2023-04-29 NOTE — Progress Notes (Signed)
Referring Physician:  Lynnea Ferrier, MD [address] [facility] [place],  [place] [address]  Primary Physician:  Lynnea Ferrier, MD  History of Present Illness: 04/29/23 Teresa Lin is an 89 year old presenting today for 6-week follow-up of a C6 fracture that has been treated conservatively in a cervical collar.  03/13/23 Teresa Lin is an 89 year old who is here today for hospital follow-up after a fall resulting in C6 fracture which was treated conservatively in a collar.  She presents today reporting compliance with her cervical collar.  She has not experienced any neck pain or radiating arm pain or new weakness.  She does endorse some soreness in her left chest wall but states that this is improving and attributes this to her rib fractures.  02/13/23 consult note Teresa Lin is a 89 y.o female presenting to the emergency department on 02/13/2023 after tripping and falling in her yard.  She was on her porch and walking down approximately 5 steps when she slipped and fell.  She denies any loss of consciousness. Imaging in the ED revealed right L2-4 TP fractures, possible T1 spinous process fracture, bilateral C6 inferior articular process fractures, and left C6 laminar fracture. Today she reports some pain in her back with leg movement but denies any radiating leg pain.  She also endorses some neck pain with movement without any radiculopathy.  Teresa Lin has no symptoms of cervical myelopathy.  The symptoms are causing a significant impact on the patient's life.   Review of Systems:  A 10 point review of systems is negative, except for the pertinent positives and negatives detailed in the HPI.  Past Medical History: Past Medical History:  Diagnosis Date   Anemia    Anxiety    Arthritis    osteoarthritis. Bilateral knee replacement, hands, meniscal tear, cervical disc disease   Atherosclerosis    Atypical chest pain    Collagen vascular disease (HCC)     Colon polyp 12/15/2017   Colon polyps    Compression fracture of L2 (HCC)    Diverticulosis    DVT (deep venous thrombosis) (HCC)    Fracture of one rib, right side, subsequent encounter for fracture with routine healing 03/08/2017   GERD (gastroesophageal reflux disease)    History of seronegative inflammatory arthritis    HOH (hard of hearing)    no hearing aids   Hypertension    IBS (irritable bowel syndrome)    Internal hemorrhoids    Phlebitis and thrombophlebitis of deep veins of lower extremities (HCC)    after surgery   Vision abnormalities     Past Surgical History: Past Surgical History:  Procedure Laterality Date   APPENDECTOMY     COLONOSCOPY N/A 01/24/2020   Procedure: COLONOSCOPY;  Surgeon: Regis Bill, MD;  Location: [facility] ENDOSCOPY;  Service: Endoscopy;  Laterality: N/A;   COLONOSCOPY WITH PROPOFOL N/A 10/24/2017   Procedure: COLONOSCOPY WITH PROPOFOL;  Surgeon: Scot Jun, MD;  Location: [facility] ENDOSCOPY;  Service: Endoscopy;  Laterality: N/A;   COLONOSCOPY WITH PROPOFOL N/A 11/21/2017   Procedure: COLONOSCOPY WITH PROPOFOL;  Surgeon: Scot Jun, MD;  Location: [facility] ENDOSCOPY;  Service: Endoscopy;  Laterality: N/A;   DILATION AND CURETTAGE OF UTERUS     ESOPHAGOGASTRODUODENOSCOPY (EGD) WITH PROPOFOL N/A 02/25/2018   Procedure: ESOPHAGOGASTRODUODENOSCOPY (EGD) WITH PROPOFOL;  Surgeon: Scot Jun, MD;  Location: [facility] ENDOSCOPY;  Service: Endoscopy;  Laterality: N/A;   EYE SURGERY     bilateral catarACT  JOINT REPLACEMENT Bilateral    knee replacement   LAPAROSCOPIC RIGHT COLECTOMY Right 12/15/2017   Procedure: LAPAROSCOPIC RIGHT COLECTOMY;  Surgeon: Earline Mayotte, MD;  Location: [facility] ORS;  Service: General;  Laterality: Right;   ORIF HUMERUS FRACTURE Right 08/09/2021   Procedure: IRRIGATION AND DEBRIDEMENT; OPEN REDUCTION INTERNAL FIXATION (ORIF) DISTAL HUMERUS FRACTURE;  Surgeon: Myrene Galas, MD;  Location: [facility] OR;  Service:  Orthopedics;  Laterality: Right;   REPLACEMENT TOTAL KNEE BILATERAL     TOTAL SHOULDER ARTHROPLASTY Right 09/24/2018   Procedure: TOTAL SHOULDER ARTHROPLASTY - REVERSE RIGHT;  Surgeon: Christena Flake, MD;  Location: [facility] ORS;  Service: Orthopedics;  Laterality: Right;    Allergies: Allergies as of 04/29/2023 - Review Complete 03/13/2023  Allergen Reaction Noted   Infliximab Other (See Comments) 01/10/2014   Lidocaine-menthol  07/25/2020   Synvisc [hylan g-f 20] Swelling 03/02/2013    Medications: Outpatient Encounter Medications as of 04/29/2023  Medication Sig   apixaban (ELIQUIS) 5 MG TABS tablet Take 5 mg by mouth 2 (two) times daily.   calcium-vitamin D (OSCAL WITH D) 500-200 MG-UNIT per tablet Take 1 tablet by mouth daily.    Cholecalciferol 50 MCG (2000 UT) TABS Take by mouth.   donepezil (ARICEPT) 5 MG tablet Take 5 mg by mouth at bedtime.   folic acid (FOLVITE) 1 MG tablet Take 2 mg by mouth daily.    hydroxychloroquine (PLAQUENIL) 200 MG tablet Take 200 mg by mouth See admin instructions. Take 2 tablets (400mg ) by mouth every Monday, Wednesday and Friday and take 1 tablet (200mg ) by mouth every Tuesday, Thursday, Saturday and Sunday   methotrexate (50 MG/ML) 1 g injection Inject 20 mg into the vein once a week.   Multiple Vitamins-Minerals (MULTIVITAMIN WITH MINERALS) tablet Take 1 tablet by mouth daily. Centrum Silver   Polyethyl Glycol-Propyl Glycol 0.4-0.3 % SOLN Place 1 drop into both eyes 3 (three) times daily as needed (for eye burning/irritation).   No facility-administered encounter medications on file as of 04/29/2023.    Social History: Social History   Tobacco Use   Smoking status: Never   Smokeless tobacco: Never  Vaping Use   Vaping status: Never Used  Substance Use Topics   Alcohol use: Yes    Alcohol/week: 2.0 standard drinks of alcohol    Types: 2 Cans of beer per week    Comment: occasionally    Drug use: No    Family Medical History: Family  History  Problem Relation Age of Onset   Hypertension Mother    Cancer Mother 76       colon ca   Heart attack Mother    Hypertension Father    Heart attack Father    Hypertension Sister    Heart attack Sister    Multiple sclerosis Daughter    Multiple sclerosis Son    Multiple sclerosis Son     Physical Examination: Today's Vitals   04/29/23 1136  BP: (!) 150/70  Weight: 61.6 kg  Height: 5\' 6"  (1.676 m)  PainSc: 0-No pain   Body mass index is 21.92 kg/m.   NEUROLOGICAL:     Awake, alert, oriented to person, place, and time.  Speech is clear and fluent. Fund of knowledge is appropriate.   Cranial Nerves grossly intact  Strength: Moves all extremities well without obvious deficit. Bilateral upper and lower extremity sensation is intact to light touch.    Gait: Ambulates well with a single prong cane No evidence of dysmetria noted.  Medical Decision  Making  Imaging: 03/13/23   FINDINGS: No fracture, dislocation or subluxation. No spondylolisthesis. No osteolytic or osteoblastic changes. Prevertebral and cervical cranial soft tissues are unremarkable.   Degenerative disc disease noted with disc space narrowing and marginal osteophytes at C5-7.   IMPRESSION: Degenerative changes. No acute osseous abnormalities.     Electronically Signed   By: Layla Maw M.D.   On: 04/05/2023 10:02  CT C spine  Alignment: Grade 1 anterolisthesis of C4 on C5.   Skull base and vertebrae: Acute nondisplaced fracture through the inferior articular process of C6 on the right (series 6, image 33) left (series 6, image 45-26). Acute nondisplaced fracture through the left lamina at C6 (series 3, image 50).   Soft tissues and spinal canal: No prevertebral fluid or swelling. No visible canal hematoma. 1.5 cm left thyroid nodule.   Disc levels:  No evidence of high-grade spinal canal stenosis.   Upper chest: Negative.   CT THORACIC SPINE FINDINGS   Alignment:  Normal.   Vertebrae: Possible nondisplaced fractures through the T1 spinous process (series 6, image 44).   Paraspinal and other soft tissues: Acute nondisplaced fractures through the right first rib (series 3, image 65). Acute fractures of the posterior left fourth-sixth ribs. There is a small amount of extrapleural blood products adjacent to the rib fractures. No pneumothorax.   Disc levels: No evidence of high-grade spinal canal stenosis.   CT LUMBAR SPINE FINDINGS   Segmentation: 5 lumbar type vertebrae.   Alignment: Normal.   Vertebrae: Chronic superior endplate compression deformity at L2. acute fractures through the right L2 transverse process (series 4, image 162), L3 transverse process (series 4, image 180), L4 transverse process (series 4, image 69).   Paraspinal and other soft tissues: Diverticulosis without evidence of diverticulitis.   Disc levels: No evidence of high-grade spinal canal stenosis.   IMPRESSION: 1. Soft tissue hematoma along the posterior scalp. No evidence of an underlying calvarial fracture or intracranial injury. 2. Acute nondisplaced fractures through the inferior articular process of C6 bilaterally. Acute nondisplaced fracture through the left lamina at C6. 3. Possible nondisplaced fractures through the T1 spinous process. 4. Acute nondisplaced fractures through the right first rib. Acute fractures of the posterior left fourth-sixth ribs. Small amount of extrapleural blood products adjacent to the rib fractures. No pneumothorax. 5. Acute fractures through the right L2, L3, and L4 transverse processes. 6. 1.5 cm left thyroid nodule. Consider further evaluation with a dedicated thyroid ultrasound if consistent with patient's goals of care.     Electronically Signed   By: Lorenza Cambridge M.D.   On: 02/13/2023 16:42  I have personally reviewed the images and agree with the above interpretation.  Assessment and Plan: Teresa Lin is a  pleasant 89 y.o. female with a history of falls status post fall earlier this month resulting in a C6 fracture.  She has been treated conservatively in a neck brace.  We will continue her cervical collar at this time.  She did not get x-rays prior to her visit today and will go for these after however her 10/31 xrays were reassuring.  I will send her a MyChart message with the results and further plan after completion particularly in regards to further needs for the cervical collar.   She was encouraged to call the office in the interim with any questions or concerns.  She expressed understanding and was in agreement with this plan.   Thank you for involving me in the care of this  patient.   I spent a total of 33 minutes in both face-to-face and non-face-to-face activities for this visit on the date of this encounter including review of imaging, review of diagnosis, review of treatment plan, physical exam, and documentation.   [facility]

## 2023-05-12 DIAGNOSIS — M0609 Rheumatoid arthritis without rheumatoid factor, multiple sites: Secondary | ICD-10-CM | POA: Diagnosis not present

## 2023-07-22 ENCOUNTER — Ambulatory Visit: Attending: Student | Admitting: Occupational Therapy

## 2023-07-22 DIAGNOSIS — M79641 Pain in right hand: Secondary | ICD-10-CM | POA: Insufficient documentation

## 2023-07-22 DIAGNOSIS — M25631 Stiffness of right wrist, not elsewhere classified: Secondary | ICD-10-CM | POA: Diagnosis present

## 2023-07-22 DIAGNOSIS — R600 Localized edema: Secondary | ICD-10-CM | POA: Diagnosis present

## 2023-07-22 DIAGNOSIS — M6281 Muscle weakness (generalized): Secondary | ICD-10-CM | POA: Diagnosis present

## 2023-07-22 DIAGNOSIS — M25641 Stiffness of right hand, not elsewhere classified: Secondary | ICD-10-CM | POA: Insufficient documentation

## 2023-07-22 DIAGNOSIS — M25531 Pain in right wrist: Secondary | ICD-10-CM | POA: Diagnosis present

## 2023-07-22 DIAGNOSIS — L905 Scar conditions and fibrosis of skin: Secondary | ICD-10-CM | POA: Insufficient documentation

## 2023-07-31 NOTE — Therapy (Signed)
 OUTPATIENT OCCUPATIONAL THERAPY ORTHO EVALUATION  Patient Name: Teresa Lin MRN: [id number] DOB:1931/08/20, 89 y.o., female Today's Date: 07/31/2023  PCP: Teresa Sites, MD REFERRING PROVIDER: Anson [place], PA-C  END OF SESSION:  OT End of Session - 07/31/23 1724     Visit Number 1    Number of Visits 13    Date for OT Re-Evaluation 09/05/23    OT Start Time 1115    OT Stop Time 1200    OT Time Calculation (min) 45 min    Activity Tolerance Patient tolerated treatment well    Behavior During Therapy [facility] for tasks assessed/performed             Past Medical History:  Diagnosis Date   Anemia    Anxiety    Arthritis    osteoarthritis. Bilateral knee replacement, hands, meniscal tear, cervical disc disease   Atherosclerosis    Atypical chest pain    Collagen vascular disease (HCC)    Colon polyp 12/15/2017   Colon polyps    Compression fracture of L2 (HCC)    Diverticulosis    DVT (deep venous thrombosis) (HCC)    Fracture of one rib, right side, subsequent encounter for fracture with routine healing 03/08/2017   GERD (gastroesophageal reflux disease)    History of seronegative inflammatory arthritis    HOH (hard of hearing)    no hearing aids   Hypertension    IBS (irritable bowel syndrome)    Internal hemorrhoids    Phlebitis and thrombophlebitis of deep veins of lower extremities (HCC)    after surgery   Vision abnormalities    Past Surgical History:  Procedure Laterality Date   APPENDECTOMY     COLONOSCOPY N/A 01/24/2020   Procedure: COLONOSCOPY;  Surgeon: Regis Bill, MD;  Location: [facility] ENDOSCOPY;  Service: Endoscopy;  Laterality: N/A;   COLONOSCOPY WITH PROPOFOL N/A 10/24/2017   Procedure: COLONOSCOPY WITH PROPOFOL;  Surgeon: Scot Jun, MD;  Location: [facility] ENDOSCOPY;  Service: Endoscopy;  Laterality: N/A;   COLONOSCOPY WITH PROPOFOL N/A 11/21/2017   Procedure: COLONOSCOPY WITH PROPOFOL;  Surgeon: Scot Jun, MD;  Location:  [facility] ENDOSCOPY;  Service: Endoscopy;  Laterality: N/A;   DILATION AND CURETTAGE OF UTERUS     ESOPHAGOGASTRODUODENOSCOPY (EGD) WITH PROPOFOL N/A 02/25/2018   Procedure: ESOPHAGOGASTRODUODENOSCOPY (EGD) WITH PROPOFOL;  Surgeon: Scot Jun, MD;  Location: [facility] ENDOSCOPY;  Service: Endoscopy;  Laterality: N/A;   EYE SURGERY     bilateral catarACT   JOINT REPLACEMENT Bilateral    knee replacement   LAPAROSCOPIC RIGHT COLECTOMY Right 12/15/2017   Procedure: LAPAROSCOPIC RIGHT COLECTOMY;  Surgeon: Earline Mayotte, MD;  Location: [facility] ORS;  Service: General;  Laterality: Right;   ORIF HUMERUS FRACTURE Right 08/09/2021   Procedure: IRRIGATION AND DEBRIDEMENT; OPEN REDUCTION INTERNAL FIXATION (ORIF) DISTAL HUMERUS FRACTURE;  Surgeon: Myrene Galas, MD;  Location: [facility] OR;  Service: Orthopedics;  Laterality: Right;   REPLACEMENT TOTAL KNEE BILATERAL     TOTAL SHOULDER ARTHROPLASTY Right 09/24/2018   Procedure: TOTAL SHOULDER ARTHROPLASTY - REVERSE RIGHT;  Surgeon: Christena Flake, MD;  Location: [facility] ORS;  Service: Orthopedics;  Laterality: Right;   Patient Active Problem List   Diagnosis Date Noted   At risk for falling 02/13/2023   Fracture of cervical vertebra, C6 (HCC) 02/13/2023   T1 vertebral fracture (HCC) 02/13/2023   Rib fractures 02/13/2023   Lumbar transverse process fracture (HCC) 02/13/2023   Leukocytosis 02/13/2023   Normocytic anemia 08/11/2021   Hypokalemia  08/09/2021   Displaced comminuted supracondylar fracture of right humerus without intercondylar fracture 08/08/2021   Open fracture of right humerus 08/08/2021   History of DVT (deep vein thrombosis) 06/06/2021   Rheumatoid arthritis of multiple Lin without rheumatoid factor (HCC) 04/24/2021   History of pulmonary embolism 02/04/2020   History of blood clotting disorder 01/12/2020   Immunosuppression (HCC) 01/25/2019   Status post reverse total shoulder replacement, right 09/24/2018   Hyperglycemia 08/31/2018   S/P  right colectomy 01/26/2018   Adenomatous polyp of ascending colon 11/24/2017   Generalized anxiety disorder 09/06/2017   Irritable bowel syndrome (IBS) 09/06/2017   Orthostasis 06/05/2016   Left shoulder pain 10/13/2015   Constipation 05/30/2015   Osteoporosis, postmenopausal 05/30/2015   Compression fracture of L2 vertebra with delayed healing 05/29/2015   Frequent falls 03/26/2015   Atherosclerosis of arteries 03/26/2015   Family history of colon cancer 09/28/2014   TMJ (temporomandibular joint syndrome) 06/12/2014   Varicose veins of both lower extremities without ulcer or inflammation 06/12/2014   B12 deficiency 09/20/2013   Fatigue 05/23/2013   Internal hemorrhoids without complication 04/20/2013   Insomnia 03/03/2013   Vertigo due to cerebrovascular disease 03/03/2013   Seronegative rheumatoid arthritis affecting lower leg (HCC) 03/03/2013    ONSET DATE: 06/07/2023  REFERRING DIAG: other fracture lower end right ulna, closed  THERAPY DIAG:  Pain in right wrist  Pain in right hand  Localized edema  Scar condition and fibrosis of skin  Muscle weakness (generalized)  Stiffness of right hand, not elsewhere classified  Stiffness of right wrist, not elsewhere classified  Rationale for Evaluation and Treatment: Rehabilitation  SUBJECTIVE:   SUBJECTIVE STATEMENT: Pt reports she fell in January and broke her arm, reports she has had a caregiver since that time, 5 days a week from 10-3 each day.   Pt accompanied by:  caregiver, Teresa Lin  PERTINENT HISTORY: Per chart at last MD appt with Dr. Joice Lofts, Teresa Lin is a 89 y.o. female who presents today for repeat evaluation status post a right wrist injury sustained on 06/06/2023. [place] landing directly on concrete, subsequent x-rays of the right wrist demonstrate evidence of a minimally displaced right distal ulna fracture. The patient was initially valued by me where she was placed  into a short arm cast, she was last evaluated on 06/18/2023, she was instructed to follow-up with me in 1 week for repeat x-rays to ensure no shifting of the fracture was identified. The patient however got sick and did not follow-up until today. The patient does still report some underlying discomfort in the right wrist, she does have a history of significant right wrist osteoarthritic changes. She is followed by rheumatology for Seronegative inflammatory arthritis. The patient has undergone infusions in the past for this condition. She denies any repeat trauma or injury for the right wrist. She denies any irritation from the short arm cast at today's visit.   PRECAUTIONS: None    WEIGHT BEARING RESTRICTIONS: No  PAIN:  Are you having pain? Yes: NPRS scale: 4/10 at rest, 9/10 with movement Pain location: right wrist and hand Pain description: dull, ache, shooting at times Aggravating factors: movement Relieving factors: rest  FALLS: Has patient fallen in last 6 months? Yes. Number of falls unsure  LIVING ENVIRONMENT: Lives with: lives with their family and lives alone Lives in: House/apartment Stairs: No    PLOF: Independent  PATIENT GOALS: Pt would like to decrease pain, increase ROM and  be able to use hand functionally for daily tasks.    NEXT MD VISIT: unsure of next MD appt date  OBJECTIVE:  Note: Objective measures were completed at Evaluation unless otherwise noted.  HAND DOMINANCE: Right  ADLs: Overall ADLs: Pt reports she has difficulty and requires assistance with cutting food, pulling up her pants, performing housework, managing buttons, gripping and opening containers, flossing her teeth and brushing her teeth.  She was living independently prior to fall but now has a caregiver to assist her 5 days a week from 10-3 daily.  She is not able to drive currently.     FUNCTIONAL OUTCOME MEASURES: To be assessed next session  UPPER EXTREMITY ROM:     Active ROM  Right eval Left eval  Shoulder flexion 90 90  Shoulder abduction 90 90  Shoulder adduction    Shoulder extension    Shoulder internal rotation    Shoulder external rotation    Elbow flexion 135 140  Elbow extension 0 0  Wrist flexion 47   Wrist extension 0   Wrist ulnar deviation    Wrist radial deviation    Wrist pronation 70   Wrist supination 50   (Blank rows = not tested)  Active ROM Right eval Left eval  Thumb MCP (0-60) 45   Thumb IP (0-80) 35   Thumb Radial abd/add (0-55) 20    Thumb Palmar abd/add (0-45) 22    Thumb Opposition to Small Finger To small finger with pain    Index MCP (0-90) 65    Index PIP (0-100) 70    Index DIP (0-70)      Long MCP (0-90)  90    Long PIP (0-100)  65    Long DIP (0-70)      Ring MCP (0-90)  90    Ring PIP (0-100)  90    Ring DIP (0-70)      Little MCP (0-90)  95    Little PIP (0-100)  90    Little DIP (0-70)      (Blank rows = not tested)  HAND FUNCTION: Grip strength: Right: 3 lbs; Left: 38 lbs, Lateral pinch: Right:   lbs, Left:   lbs, and 3 point pinch: Right:   lbs, Left:   lbs  COORDINATION: Impaired coordination with decreased functional use of right hand and wrist   SENSATION: Increased pain in wrist and hand, will continue to assess  EDEMA: edema present in right hand and wrist   COGNITION: Overall cognitive status:  Pt demonstrates difficulty at times recalling new learning and information, benefits from repetition Areas of impairment: Memory: Deficits short term memory  TREATMENT DATE: 07/22/2023                                                                                                                            Modalities: Contrast bath:  Time: 8 Location: right hand and wrist To decrease pain, increase tissue mobility and increase  ROM  Pt educated on use of contrast at home for edema control of right hand and wrist.   Pt instructed on ROM on wrist and hand with tendon gliding exercises Education  on gripping patterns, issued red foam built up handle to use with toothbrush. Pt to perform contrast and exercises 3 times a day   PATIENT EDUCATION: Education details: use of contrast for edema control, ROM exercises Person educated: Patient and Nurse, learning disability Education method: Explanation, Demonstration, Tactile cues, Verbal cues, and Handouts Education comprehension: verbalized understanding, verbal cues required, and needs further education  HOME EXERCISE PROGRAM: See above for details   GOALS: Goals reviewed with patient? Yes  SHORT TERM GOALS: Target date: 08/12/2023  Pt will demonstrate the understanding of use of contrast for edema control of right wrist and hand. Baseline: minimal knowledge of contrast at eval  Goal status: INITIAL  2.  Pt will demonstrate oral hygiene with modified independence with use of built up handles on her toothbrush. Baseline: difficulty at eval  Goal status: INITIAL   LONG TERM GOALS: Target date: 09/05/2023  Pt will complete home exercise program with modified independence. Baseline: no current program Goal status: INITIAL  2.  Pt will improve grip strength by 10 pounds to manage opening containers with modified independence. Baseline: difficulty at eval  Goal status: INITIAL  3.  Pt will improve strength of RUE to pull up pants with modified independence. Baseline: difficulty at eval Goal status: INITIAL  4.  Pt will improve hand function to be able to cut food with modified independence. Baseline: unable to cut food at eval  Goal status: INITIAL  5.  Pt will demonstrate ability to perform buttons with modified independence. Baseline: difficulty with buttons at eval Goal status: INITIAL  6.  Pt will improve ROM to [facility] to manage opening doors. Baseline: limited ROM at eval, difficulty managing doors. Goal status: INITIAL  ASSESSMENT:  CLINICAL IMPRESSION: Patient is a 89 y.o. female who was seen today for occupational  therapy evaluation for right closed fracture of the lower end of the right ulna. Pt with a history of arthritis which continues to affect her hand function.  Pt presents with increased pain, decreased ROM of wrist, hand and digits, decreased strength, decreased ability to perform self care and IADL tasks.  Pt would benefit from skilled OT services to maximize safety and independence with necessary daily tasks.    PERFORMANCE DEFICITS: in functional skills including ADLs, IADLs, coordination, dexterity, sensation, edema, ROM, strength, pain, fascial restrictions, flexibility, Fine motor control, Gross motor control, balance, decreased knowledge of use of DME, skin integrity, and UE functional use, cognitive skills including attention and memory, and psychosocial skills including environmental adaptation, habits, and routines and behaviors.   IMPAIRMENTS: are limiting patient from ADLs, IADLs, and social participation.   COMORBIDITIES: may have co-morbidities  that affects occupational performance. Patient will benefit from skilled OT to address above impairments and improve overall function.  MODIFICATION OR ASSISTANCE TO COMPLETE EVALUATION: Min-Moderate modification of tasks or assist with assess necessary to complete an evaluation.  OT OCCUPATIONAL PROFILE AND HISTORY: Detailed assessment: Review of records and additional review of physical, cognitive, psychosocial history related to current functional performance.  CLINICAL DECISION MAKING: Moderate - several treatment options, min-mod task modification necessary  REHAB POTENTIAL: Good  EVALUATION COMPLEXITY: Moderate      PLAN:  OT FREQUENCY: 1-2x/week  OT DURATION: 6 weeks  PLANNED INTERVENTIONS: 97168 OT Re-evaluation, 97535 self care/ADL training, 95284 therapeutic exercise, 97530  therapeutic activity, 97112 neuromuscular re-education, 97140 manual therapy, 97035 ultrasound, 16109 paraffin, 97039 fluidotherapy, 97010 moist heat,  97010 cryotherapy, 97034 contrast bath, 97760 Orthotics management and training, [phone] Splinting (initial encounter), scar mobilization, passive range of motion, patient/family education, and DME and/or AE instructions  RECOMMENDED OTHER SERVICES: none currently  CONSULTED AND AGREED WITH PLAN OF CARE: Patient and family member/caregiver  PLAN FOR NEXT SESSION: continue with edema control measures, modify HEP as pt progresses.    Hawk Mones Cornelius Moras, OTR/L, CLT  Irma Roulhac, OT 07/31/2023, 5:26 PM

## 2023-08-01 ENCOUNTER — Encounter: Payer: Self-pay | Admitting: Occupational Therapy

## 2023-08-01 ENCOUNTER — Ambulatory Visit: Admitting: Occupational Therapy

## 2023-08-01 DIAGNOSIS — M79641 Pain in right hand: Secondary | ICD-10-CM

## 2023-08-01 DIAGNOSIS — M25531 Pain in right wrist: Secondary | ICD-10-CM | POA: Diagnosis not present

## 2023-08-01 DIAGNOSIS — M25641 Stiffness of right hand, not elsewhere classified: Secondary | ICD-10-CM

## 2023-08-01 DIAGNOSIS — M6281 Muscle weakness (generalized): Secondary | ICD-10-CM

## 2023-08-01 DIAGNOSIS — M25631 Stiffness of right wrist, not elsewhere classified: Secondary | ICD-10-CM

## 2023-08-01 DIAGNOSIS — R600 Localized edema: Secondary | ICD-10-CM

## 2023-08-01 NOTE — Therapy (Signed)
 OUTPATIENT OCCUPATIONAL THERAPY ORTHO EVALUATION  Patient Name: Teresa Lin MRN: [id number] DOB:05/20/1931, 89 y.o., female Today's Date: 08/01/2023  PCP: Curtis Sites, MD REFERRING PROVIDER: Anson [place], PA-C  END OF SESSION:  OT End of Session - 08/01/23 2033     Visit Number 2    Number of Visits 13    Date for OT Re-Evaluation 09/05/23    OT Start Time 1030    OT Stop Time 1129    OT Time Calculation (min) 59 min    Activity Tolerance Patient tolerated treatment well    Behavior During Therapy [facility] for tasks assessed/performed             Past Medical History:  Diagnosis Date   Anemia    Anxiety    Arthritis    osteoarthritis. Bilateral knee replacement, hands, meniscal tear, cervical disc disease   Atherosclerosis    Atypical chest pain    Collagen vascular disease (HCC)    Colon polyp 12/15/2017   Colon polyps    Compression fracture of L2 (HCC)    Diverticulosis    DVT (deep venous thrombosis) (HCC)    Fracture of one rib, right side, subsequent encounter for fracture with routine healing 03/08/2017   GERD (gastroesophageal reflux disease)    History of seronegative inflammatory arthritis    HOH (hard of hearing)    no hearing aids   Hypertension    IBS (irritable bowel syndrome)    Internal hemorrhoids    Phlebitis and thrombophlebitis of deep veins of lower extremities (HCC)    after surgery   Vision abnormalities    Past Surgical History:  Procedure Laterality Date   APPENDECTOMY     COLONOSCOPY N/A 01/24/2020   Procedure: COLONOSCOPY;  Surgeon: Regis Bill, MD;  Location: [facility] ENDOSCOPY;  Service: Endoscopy;  Laterality: N/A;   COLONOSCOPY WITH PROPOFOL N/A 10/24/2017   Procedure: COLONOSCOPY WITH PROPOFOL;  Surgeon: Scot Jun, MD;  Location: [facility] ENDOSCOPY;  Service: Endoscopy;  Laterality: N/A;   COLONOSCOPY WITH PROPOFOL N/A 11/21/2017   Procedure: COLONOSCOPY WITH PROPOFOL;  Surgeon: Scot Jun, MD;  Location:  [facility] ENDOSCOPY;  Service: Endoscopy;  Laterality: N/A;   DILATION AND CURETTAGE OF UTERUS     ESOPHAGOGASTRODUODENOSCOPY (EGD) WITH PROPOFOL N/A 02/25/2018   Procedure: ESOPHAGOGASTRODUODENOSCOPY (EGD) WITH PROPOFOL;  Surgeon: Scot Jun, MD;  Location: [facility] ENDOSCOPY;  Service: Endoscopy;  Laterality: N/A;   EYE SURGERY     bilateral catarACT   JOINT REPLACEMENT Bilateral    knee replacement   LAPAROSCOPIC RIGHT COLECTOMY Right 12/15/2017   Procedure: LAPAROSCOPIC RIGHT COLECTOMY;  Surgeon: Earline Mayotte, MD;  Location: [facility] ORS;  Service: General;  Laterality: Right;   ORIF HUMERUS FRACTURE Right 08/09/2021   Procedure: IRRIGATION AND DEBRIDEMENT; OPEN REDUCTION INTERNAL FIXATION (ORIF) DISTAL HUMERUS FRACTURE;  Surgeon: Myrene Galas, MD;  Location: [facility] OR;  Service: Orthopedics;  Laterality: Right;   REPLACEMENT TOTAL KNEE BILATERAL     TOTAL SHOULDER ARTHROPLASTY Right 09/24/2018   Procedure: TOTAL SHOULDER ARTHROPLASTY - REVERSE RIGHT;  Surgeon: Christena Flake, MD;  Location: [facility] ORS;  Service: Orthopedics;  Laterality: Right;   Patient Active Problem List   Diagnosis Date Noted   At risk for falling 02/13/2023   Fracture of cervical vertebra, C6 (HCC) 02/13/2023   T1 vertebral fracture (HCC) 02/13/2023   Rib fractures 02/13/2023   Lumbar transverse process fracture (HCC) 02/13/2023   Leukocytosis 02/13/2023   Normocytic anemia 08/11/2021   Hypokalemia  08/09/2021   Displaced comminuted supracondylar fracture of right humerus without intercondylar fracture 08/08/2021   Open fracture of right humerus 08/08/2021   History of DVT (deep vein thrombosis) 06/06/2021   Rheumatoid arthritis of multiple sites without rheumatoid factor (HCC) 04/24/2021   History of pulmonary embolism 02/04/2020   History of blood clotting disorder 01/12/2020   Immunosuppression (HCC) 01/25/2019   Status post reverse total shoulder replacement, right 09/24/2018   Hyperglycemia 08/31/2018   S/P  right colectomy 01/26/2018   Adenomatous polyp of ascending colon 11/24/2017   Generalized anxiety disorder 09/06/2017   Irritable bowel syndrome (IBS) 09/06/2017   Orthostasis 06/05/2016   Left shoulder pain 10/13/2015   Constipation 05/30/2015   Osteoporosis, postmenopausal 05/30/2015   Compression fracture of L2 vertebra with delayed healing 05/29/2015   Frequent falls 03/26/2015   Atherosclerosis of arteries 03/26/2015   Family history of colon cancer 09/28/2014   TMJ (temporomandibular joint syndrome) 06/12/2014   Varicose veins of both lower extremities without ulcer or inflammation 06/12/2014   B12 deficiency 09/20/2013   Fatigue 05/23/2013   Internal hemorrhoids without complication 04/20/2013   Insomnia 03/03/2013   Vertigo due to cerebrovascular disease 03/03/2013   Seronegative rheumatoid arthritis affecting lower leg (HCC) 03/03/2013    ONSET DATE: 06/07/2023  REFERRING DIAG: other fracture lower end right ulna, closed  THERAPY DIAG:  Pain in right wrist  Pain in right hand  Localized edema  Stiffness of right hand, not elsewhere classified  Stiffness of right wrist, not elsewhere classified  Muscle weakness (generalized)  Rationale for Evaluation and Treatment: Rehabilitation  SUBJECTIVE:   SUBJECTIVE STATEMENT: Pt reports she fell in January and broke her arm, reports she has had a caregiver since that time, 5 days a week from 10-3 each day.   Pt accompanied by:  caregiver, Delaney Meigs  PERTINENT HISTORY: Per chart at last MD appt with Dr. Joice Lofts, Teresa Lin is a 89 y.o. female who presents today for repeat evaluation status post a right wrist injury sustained on 06/06/2023. [place] landing directly on concrete, subsequent x-rays of the right wrist demonstrate evidence of a minimally displaced right distal ulna fracture. The patient was initially valued by me where she was placed into a short arm cast, she was last  evaluated on 06/18/2023, she was instructed to follow-up with me in 1 week for repeat x-rays to ensure no shifting of the fracture was identified. The patient however got sick and did not follow-up until today. The patient does still report some underlying discomfort in the right wrist, she does have a history of significant right wrist osteoarthritic changes. She is followed by rheumatology for Seronegative inflammatory arthritis. The patient has undergone infusions in the past for this condition. She denies any repeat trauma or injury for the right wrist. She denies any irritation from the short arm cast at today's visit.   PRECAUTIONS: None    WEIGHT BEARING RESTRICTIONS: No  PAIN:  Are you having pain? Yes: NPRS scale: 4/10 at rest, 9/10 with movement Pain location: right wrist and hand Pain description: dull, ache, shooting at times Aggravating factors: movement Relieving factors: rest  FALLS: Has patient fallen in last 6 months? Yes. Number of falls unsure  LIVING ENVIRONMENT: Lives with: lives with their family and lives alone Lives in: House/apartment Stairs: No    PLOF: Independent  PATIENT GOALS: Pt would like to decrease pain, increase ROM and be able to use hand functionally for  daily tasks.    NEXT MD VISIT: unsure of next MD appt date  OBJECTIVE:  Note: Objective measures were completed at Evaluation unless otherwise noted.  HAND DOMINANCE: Right  ADLs: Overall ADLs: Pt reports she has difficulty and requires assistance with cutting food, pulling up her pants, performing housework, managing buttons, gripping and opening containers, flossing her teeth and brushing her teeth.  She was living independently prior to fall but now has a caregiver to assist her 5 days a week from 10-3 daily.  She is not able to drive currently.     FUNCTIONAL OUTCOME MEASURES: To be assessed next session  UPPER EXTREMITY ROM:     Active ROM Right eval Left eval  Shoulder flexion 90  90  Shoulder abduction 90 90  Shoulder adduction    Shoulder extension    Shoulder internal rotation    Shoulder external rotation    Elbow flexion 135 140  Elbow extension 0 0  Wrist flexion 47   Wrist extension 0   Wrist ulnar deviation    Wrist radial deviation    Wrist pronation 70   Wrist supination 50   (Blank rows = not tested)  Active ROM Right eval Left eval  Thumb MCP (0-60) 45   Thumb IP (0-80) 35   Thumb Radial abd/add (0-55) 20    Thumb Palmar abd/add (0-45) 22    Thumb Opposition to Small Finger To small finger with pain    Index MCP (0-90) 65    Index PIP (0-100) 70    Index DIP (0-70)      Long MCP (0-90)  90    Long PIP (0-100)  65    Long DIP (0-70)      Ring MCP (0-90)  90    Ring PIP (0-100)  90    Ring DIP (0-70)      Little MCP (0-90)  95    Little PIP (0-100)  90    Little DIP (0-70)      (Blank rows = not tested)  HAND FUNCTION: Eval: Grip strength: Right: 3 lbs; Left: 38 lbs, Lateral pinch: Right:   lbs, Left:   lbs, and 3 point pinch: Right:   lbs, Left:   lbs 08/01/23: Grip strength: Right: 10 lbs; Left: 38 lbs, Lateral pinch: Right:   lbs, Left:   lbs, and 3 point pinch: Right:   lbs, Left:   lbs  COORDINATION: Impaired coordination with decreased functional use of right hand and wrist   SENSATION: Increased pain in wrist and hand, will continue to assess  EDEMA: edema present in right hand and wrist   COGNITION: Overall cognitive status:  Pt demonstrates difficulty at times recalling new learning and information, benefits from repetition Areas of impairment: Memory: Deficits short term memory  TREATMENT DATE: 08/01/2023  Modalities: Fluidotherapy: with 2 trials of 1 min of cold to provide a contrast effect to decrease edema. Time: 10 Location: right hand and wrist To decrease pain, increase tissue mobility  and increase ROM   Manual Therapy:   Following fluidotherapy, pt seen for manual therapy for soft tissue massage of right hand, wrist.  Carpal and metacarpal spreads, webspace spread.     Therapeutic Exercise:  Pt requesting therapist to recheck grip strength, R 10# this date.  Pt reports improved ROM over the last couple weeks. Continue with use of contrast at home for edema control of right hand and wrist.   Pt re-instructed on ROM on wrist and hand with tendon gliding exercises with cues Wrist flexion/extension tabletop and on arm rest for 10 reps each Oppositional movements of thumb to each fingertip, able to perform to lateral aspect of small finger.  Pt issued written program with exercises, added thumb radial and palmar ABD, 10 reps.  Increased stiffness with palmar ABD. Supination/pronation of forearm for 10 reps Education on gripping patterns, issued red foam built up handle to use with toothbrush. Pt to perform contrast daily and exercises 2-3 times a day   PATIENT EDUCATION: Education details: use of contrast for edema control, ROM exercises Person educated: Patient and Nurse, learning disability Education method: Explanation, Demonstration, Tactile cues, Verbal cues, and Handouts Education comprehension: verbalized understanding, verbal cues required, and needs further education  HOME EXERCISE PROGRAM: See above for details   GOALS: Goals reviewed with patient? Yes  SHORT TERM GOALS: Target date: 08/12/2023  Pt will demonstrate the understanding of use of contrast for edema control of right wrist and hand. Baseline: minimal knowledge of contrast at eval  Goal status: INITIAL  2.  Pt will demonstrate oral hygiene with modified independence with use of built up handles on her toothbrush. Baseline: difficulty at eval  Goal status: INITIAL   LONG TERM GOALS: Target date: 09/05/2023  Pt will complete home exercise program with modified independence. Baseline: no current  program Goal status: INITIAL  2.  Pt will improve grip strength by 10 pounds to manage opening containers with modified independence. Baseline: difficulty at eval  Goal status: INITIAL  3.  Pt will improve strength of RUE to pull up pants with modified independence. Baseline: difficulty at eval Goal status: INITIAL  4.  Pt will improve hand function to be able to cut food with modified independence. Baseline: unable to cut food at eval  Goal status: INITIAL  5.  Pt will demonstrate ability to perform buttons with modified independence. Baseline: difficulty with buttons at eval Goal status: INITIAL  6.  Pt will improve ROM to [facility] to manage opening doors. Baseline: limited ROM at eval, difficulty managing doors. Goal status: INITIAL  ASSESSMENT:  CLINICAL IMPRESSION: Patient is a 89 y.o. female who was seen for occupational therapy evaluation for right closed fracture of the lower end of the right ulna. Pt with a history of arthritis which continues to affect her hand function.  Pt presents with increased pain, decreased ROM of wrist, hand and digits, decreased strength, decreased ability to perform self care and IADL tasks. Pt has been consistent with performing contrast at home followed by [facility] exercises with good success over the last 1-2 weeks, grip strength increased from 3# to 10#.  Pt remains limited in wrist extension and also has some pain in right thumb which may be due to arthritis. Added to current exercises for supination/pronation, and thumb radial and palmar ABD. Pt  responds well to re instruction of exercises as well as repetition for carry over.  Pt would benefit from skilled OT services to maximize safety and independence with necessary daily tasks.    PERFORMANCE DEFICITS: in functional skills including ADLs, IADLs, coordination, dexterity, sensation, edema, ROM, strength, pain, fascial restrictions, flexibility, Fine motor control, Gross motor control, balance,  decreased knowledge of use of DME, skin integrity, and UE functional use, cognitive skills including attention and memory, and psychosocial skills including environmental adaptation, habits, and routines and behaviors.   IMPAIRMENTS: are limiting patient from ADLs, IADLs, and social participation.   COMORBIDITIES: may have co-morbidities  that affects occupational performance. Patient will benefit from skilled OT to address above impairments and improve overall function.  MODIFICATION OR ASSISTANCE TO COMPLETE EVALUATION: Min-Moderate modification of tasks or assist with assess necessary to complete an evaluation.  OT OCCUPATIONAL PROFILE AND HISTORY: Detailed assessment: Review of records and additional review of physical, cognitive, psychosocial history related to current functional performance.  CLINICAL DECISION MAKING: Moderate - several treatment options, min-mod task modification necessary  REHAB POTENTIAL: Good  EVALUATION COMPLEXITY: Moderate      PLAN:  OT FREQUENCY: 1-2x/week  OT DURATION: 6 weeks  PLANNED INTERVENTIONS: 97168 OT Re-evaluation, 97535 self care/ADL training, 69629 therapeutic exercise, 97530 therapeutic activity, 97112 neuromuscular re-education, 97140 manual therapy, 97035 ultrasound, 97018 paraffin, 52841 fluidotherapy, 97010 moist heat, 97010 cryotherapy, 97034 contrast bath, 97760 Orthotics management and training, 32440 Splinting (initial encounter), scar mobilization, passive range of motion, patient/family education, and DME and/or AE instructions  RECOMMENDED OTHER SERVICES: none currently  CONSULTED AND AGREED WITH PLAN OF CARE: Patient and family member/caregiver  PLAN FOR NEXT SESSION: continue with edema control measures, modify HEP as pt progresses.    Donathan Buller Cornelius Moras, OTR/L, CLT  Kazmir Oki, OT 08/01/2023, 8:34 PM

## 2023-08-18 ENCOUNTER — Ambulatory Visit: Attending: Student | Admitting: Occupational Therapy

## 2023-08-18 DIAGNOSIS — M25641 Stiffness of right hand, not elsewhere classified: Secondary | ICD-10-CM | POA: Insufficient documentation

## 2023-08-18 DIAGNOSIS — M25531 Pain in right wrist: Secondary | ICD-10-CM | POA: Diagnosis present

## 2023-08-18 DIAGNOSIS — R600 Localized edema: Secondary | ICD-10-CM | POA: Diagnosis present

## 2023-08-18 DIAGNOSIS — M79641 Pain in right hand: Secondary | ICD-10-CM | POA: Diagnosis present

## 2023-08-18 DIAGNOSIS — M25631 Stiffness of right wrist, not elsewhere classified: Secondary | ICD-10-CM | POA: Diagnosis present

## 2023-08-18 DIAGNOSIS — M6281 Muscle weakness (generalized): Secondary | ICD-10-CM | POA: Insufficient documentation

## 2023-08-18 NOTE — Therapy (Signed)
 OUTPATIENT OCCUPATIONAL THERAPY ORTHO TREATMENT  Patient Name: Teresa Lin MRN: [id number] DOB:08-20-1931, 89 y.o., female Today's Date: 08/18/2023  PCP: Teresa Sites, MD REFERRING PROVIDER: Anson [place], PA-C  END OF SESSION:  OT End of Session - 08/18/23 1310     Visit Number 3    Number of Visits 13    Date for OT Re-Evaluation 09/05/23    OT Start Time 1035    OT Stop Time 1125    OT Time Calculation (min) 50 min    Activity Tolerance Patient tolerated treatment well    Behavior During Therapy [facility] for tasks assessed/performed             Past Medical History:  Diagnosis Date   Anemia    Anxiety    Arthritis    osteoarthritis. Bilateral knee replacement, hands, meniscal tear, cervical disc disease   Atherosclerosis    Atypical chest pain    Collagen vascular disease (HCC)    Colon polyp 12/15/2017   Colon polyps    Compression fracture of L2 (HCC)    Diverticulosis    DVT (deep venous thrombosis) (HCC)    Fracture of one rib, right side, subsequent encounter for fracture with routine healing 03/08/2017   GERD (gastroesophageal reflux disease)    History of seronegative inflammatory arthritis    HOH (hard of hearing)    no hearing aids   Hypertension    IBS (irritable bowel syndrome)    Internal hemorrhoids    Phlebitis and thrombophlebitis of deep veins of lower extremities (HCC)    after surgery   Vision abnormalities    Past Surgical History:  Procedure Laterality Date   APPENDECTOMY     COLONOSCOPY N/A 01/24/2020   Procedure: COLONOSCOPY;  Surgeon: Regis Bill, MD;  Location: [facility] ENDOSCOPY;  Service: Endoscopy;  Laterality: N/A;   COLONOSCOPY WITH PROPOFOL N/A 10/24/2017   Procedure: COLONOSCOPY WITH PROPOFOL;  Surgeon: Scot Jun, MD;  Location: [facility] ENDOSCOPY;  Service: Endoscopy;  Laterality: N/A;   COLONOSCOPY WITH PROPOFOL N/A 11/21/2017   Procedure: COLONOSCOPY WITH PROPOFOL;  Surgeon: Scot Jun, MD;  Location: [facility]  ENDOSCOPY;  Service: Endoscopy;  Laterality: N/A;   DILATION AND CURETTAGE OF UTERUS     ESOPHAGOGASTRODUODENOSCOPY (EGD) WITH PROPOFOL N/A 02/25/2018   Procedure: ESOPHAGOGASTRODUODENOSCOPY (EGD) WITH PROPOFOL;  Surgeon: Scot Jun, MD;  Location: [facility] ENDOSCOPY;  Service: Endoscopy;  Laterality: N/A;   EYE SURGERY     bilateral catarACT   JOINT REPLACEMENT Bilateral    knee replacement   LAPAROSCOPIC RIGHT COLECTOMY Right 12/15/2017   Procedure: LAPAROSCOPIC RIGHT COLECTOMY;  Surgeon: Earline Mayotte, MD;  Location: [facility] ORS;  Service: General;  Laterality: Right;   ORIF HUMERUS FRACTURE Right 08/09/2021   Procedure: IRRIGATION AND DEBRIDEMENT; OPEN REDUCTION INTERNAL FIXATION (ORIF) DISTAL HUMERUS FRACTURE;  Surgeon: Myrene Galas, MD;  Location: [facility] OR;  Service: Orthopedics;  Laterality: Right;   REPLACEMENT TOTAL KNEE BILATERAL     TOTAL SHOULDER ARTHROPLASTY Right 09/24/2018   Procedure: TOTAL SHOULDER ARTHROPLASTY - REVERSE RIGHT;  Surgeon: Christena Flake, MD;  Location: [facility] ORS;  Service: Orthopedics;  Laterality: Right;   Patient Active Problem List   Diagnosis Date Noted   At risk for falling 02/13/2023   Fracture of cervical vertebra, C6 (HCC) 02/13/2023   T1 vertebral fracture (HCC) 02/13/2023   Rib fractures 02/13/2023   Lumbar transverse process fracture (HCC) 02/13/2023   Leukocytosis 02/13/2023   Normocytic anemia 08/11/2021   Hypokalemia  08/09/2021   Displaced comminuted supracondylar fracture of right humerus without intercondylar fracture 08/08/2021   Open fracture of right humerus 08/08/2021   History of DVT (deep vein thrombosis) 06/06/2021   Rheumatoid arthritis of multiple Lin without rheumatoid factor (HCC) 04/24/2021   History of pulmonary embolism 02/04/2020   History of blood clotting disorder 01/12/2020   Immunosuppression (HCC) 01/25/2019   Status post reverse total shoulder replacement, right 09/24/2018   Hyperglycemia 08/31/2018   S/P right  colectomy 01/26/2018   Adenomatous polyp of ascending colon 11/24/2017   Generalized anxiety disorder 09/06/2017   Irritable bowel syndrome (IBS) 09/06/2017   Orthostasis 06/05/2016   Left shoulder pain 10/13/2015   Constipation 05/30/2015   Osteoporosis, postmenopausal 05/30/2015   Compression fracture of L2 vertebra with delayed healing 05/29/2015   Frequent falls 03/26/2015   Atherosclerosis of arteries 03/26/2015   Family history of colon cancer 09/28/2014   TMJ (temporomandibular joint syndrome) 06/12/2014   Varicose veins of both lower extremities without ulcer or inflammation 06/12/2014   B12 deficiency 09/20/2013   Fatigue 05/23/2013   Internal hemorrhoids without complication 04/20/2013   Insomnia 03/03/2013   Vertigo due to cerebrovascular disease 03/03/2013   Seronegative rheumatoid arthritis affecting lower leg (HCC) 03/03/2013    ONSET DATE: 06/07/2023  REFERRING DIAG: other fracture lower end right ulna, closed  THERAPY DIAG:  Pain in right wrist  Pain in right hand  Localized edema  Stiffness of right hand, not elsewhere classified  Stiffness of right wrist, not elsewhere classified  Muscle weakness (generalized)  Rationale for Evaluation and Treatment: Rehabilitation  SUBJECTIVE:   SUBJECTIVE STATEMENT: See note  Pt accompanied by:  caregiver, Teresa Lin  PERTINENT HISTORY: Per chart at last MD appt with Dr. Joice Lin, Teresa Lin is a 89 y.o. female who presents today for repeat evaluation status post a right wrist injury sustained on 06/06/2023. [place] landing directly on concrete, subsequent x-rays of the right wrist demonstrate evidence of a minimally displaced right distal ulna fracture. The patient was initially valued by me where she was placed into a short arm cast, she was last evaluated on 06/18/2023, she was instructed to follow-up with me in 1 week for repeat x-rays to ensure no shifting of the fracture was  identified. The patient however got sick and did not follow-up until today. The patient does still report some underlying discomfort in the right wrist, she does have a history of significant right wrist osteoarthritic changes. She is followed by rheumatology for Seronegative inflammatory arthritis. The patient has undergone infusions in the past for this condition. She denies any repeat trauma or injury for the right wrist. She denies any irritation from the short arm cast at today's visit.   PRECAUTIONS: None    WEIGHT BEARING RESTRICTIONS: No  PAIN:  Are you having pain? Yes: NPRS scale: 4/10 at rest, 9/10 with movement Pain location: right wrist and hand Pain description: dull, ache, shooting at times Aggravating factors: movement Relieving factors: rest  FALLS: Has patient fallen in last 6 months? Yes. Number of falls unsure  LIVING ENVIRONMENT: Lives with: lives with their family and lives alone Lives in: House/apartment Stairs: No    PLOF: Independent  PATIENT GOALS: Pt would like to decrease pain, increase ROM and be able to use hand functionally for daily tasks.    NEXT MD VISIT: unsure of next MD appt date  OBJECTIVE:  Note: Objective measures were completed at Evaluation unless otherwise  noted.  HAND DOMINANCE: Right  ADLs: Overall ADLs: Pt reports she has difficulty and requires assistance with cutting food, pulling up her pants, performing housework, managing buttons, gripping and opening containers, flossing her teeth and brushing her teeth.  She was living independently prior to fall but now has a caregiver to assist her 5 days a week from 10-3 daily.  She is not able to drive currently.     FUNCTIONAL OUTCOME MEASURES: To be assessed next session  UPPER EXTREMITY ROM:     Active ROM Right eval Left eval R 08/18/23  Shoulder flexion 90 90   Shoulder abduction 90 90   Shoulder adduction     Shoulder extension     Shoulder internal rotation     Shoulder  external rotation     Elbow flexion 135 140   Elbow extension 0 0   Wrist flexion 47  35  Wrist extension 0  20  Wrist ulnar deviation     Wrist radial deviation     Wrist pronation 70  90  Wrist supination 50  75  (Blank rows = not tested)  Active ROM Right eval Left eval  Thumb MCP (0-60) 45   Thumb IP (0-80) 35   Thumb Radial abd/add (0-55) 20    Thumb Palmar abd/add (0-45) 22    Thumb Opposition to Small Finger To small finger with pain Opposition to 5th pain in thumb   Index MCP (0-90) 65  80  Index PIP (0-100) 70  75  Index DIP (0-70)      Long MCP (0-90)  90  90  Long PIP (0-100)  65  55  Long DIP (0-70)      Ring MCP (0-90)  90  90  Ring PIP (0-100)  90  90  Ring DIP (0-70)      Little MCP (0-90)  95  95  Little PIP (0-100)  90  90  Little DIP (0-70)      (Blank rows = not tested)  HAND FUNCTION: Eval: Grip strength: Right: 3 lbs; Left: 38 lbs, Lateral pinch: Right:   lbs, Left:   lbs, and 3 point pinch: Right:   lbs, Left:   lbs 08/01/23: Grip strength: Right: 10 lbs; Left: 38 lbs, Lateral pinch: Right:   lbs, Left:   lbs, and 3 point pinch: Right:   lbs, Left:   lbs 08/18/23: Grip strength: Right: 19 lbs; Left: 38 lbs, Lateral pinch: Right:   8lbs, Left:   lbs, and 3 point pinch: Right:  4 lbs, Left:   lbs  COORDINATION: Impaired coordination with decreased functional use of right hand and wrist   SENSATION: Increased pain in wrist and hand, will continue to assess  EDEMA: edema present in right hand and wrist   COGNITION: Overall cognitive status:  Pt demonstrates difficulty at times recalling new learning and information, benefits from repetition Areas of impairment: Memory: Deficits short term memory  TREATMENT DATE: 08/18/2023          Patient arrives and measurements taken compared to last time but also compared to when patient was seen by this OT in 2023-since last time patient had 2 infusions rheumatologist. Patient digit flexion at baseline. Did  encourage and reviewed with patient and caretaker to do some soft tissue massage to thumb webspace as well as metacarpal spreads to focus on opening the the metacarpals for thumb palmar radial abduction.-Since being immobilized earlier this year in a cast and brace  See flowsheet for motion and strength measurements                                                                                                             Modalities: Fluidotherapy: with 2 trials of 1 min of cold to provide a contrast effect to decrease edema. Time: 8 Location: right hand and wrist To decrease pain, increase tissue mobility and increase ROM   Manual Therapy:   Following fluidotherapy, pt seen for manual therapy for soft tissue massage of right hand, wrist.  Carpal and metacarpal spreads, webspace spread.  Reviewed with caretaker   Therapeutic Exercise:  Continue with use of contrast at home for edema control of right hand and wrist as well as increased motion decrease pain Patient can continue with tendon glides.  But focusing on thumb palmar radial abduction.  Reviewed with patient and caretaker again active assisted range of motion for wrist flexion extension as well as radial ulnar deviation stop when feeling pain.  10 reps Supination/pronation of forearm for 10 reps-focusing on forearm and not wrist.  Discussed with patient and caretaker home safety.  Since she fractured her wrist patient had a fall at home tripping over a loose carpet. Reviewed with her them about loose carpets and pads.  As well as nightlight at nighttime to go to the bathroom. Discussed her rails at the stairs but she has ramp. Bathroom safety patient has railings she has a shower chair. Recommend a nonslippery surface for the bathtub. She has a hand-held shower.  PATIENT EDUCATION: Education details: use of contrast for edema control, ROM exercises Person educated: Patient and Nurse, learning disability Education method: Explanation,  Demonstration, Tactile cues, Verbal cues, and Handouts Education comprehension: verbalized understanding, verbal cues required, and needs further education  HOME EXERCISE PROGRAM: See above for details   GOALS: Goals reviewed with patient? Yes  SHORT TERM GOALS: Target date: 08/12/2023  Pt will demonstrate the understanding of use of contrast for edema control of right wrist and hand. Baseline: minimal knowledge of contrast at eval  Goal status: INITIAL  2.  Pt will demonstrate oral hygiene with modified independence with use of built up handles on her toothbrush. Baseline: difficulty at eval  Goal status: INITIAL   LONG TERM GOALS: Target date: 09/05/2023  Pt will complete home exercise program with modified independence. Baseline: no current program Goal status: INITIAL  2.  Pt will improve grip strength by 10 pounds to manage opening containers with modified independence. Baseline: difficulty at eval  Goal status: INITIAL  3.  Pt will improve strength of RUE to pull up pants with modified independence. Baseline: difficulty at eval Goal status: INITIAL  4.  Pt will improve hand function to be able to cut food with modified independence. Baseline: unable to cut food at eval  Goal status: INITIAL  5.  Pt will demonstrate ability to perform buttons with modified independence. Baseline: difficulty with buttons at eval Goal status: INITIAL  6.  Pt will improve ROM to [facility] to manage opening doors. Baseline: limited  ROM at eval, difficulty managing doors. Goal status: INITIAL  ASSESSMENT:  CLINICAL IMPRESSION: Patient seen for occupational therapy evaluation for right closed fracture of the lower end of the right ulna. Pt with a history of arthritis which continues to affect her hand function.  Patient was seen by this OT in 2023.  Patient is range of motion and strength in right wrist and hand was assessed and compared to 2023.  Patient's digit range of motion about  normal for patient's baseline.  Pt remains limited in wrist extension/flexion as well as supination.  Reviewed with patient and caretaker home exercises as well as home safety to decrease risk of falling at home.  Patient needed reviewing of exercises.  Pt responds well to re instruction of exercises as well as repetition for carry over.  Pt would benefit from skilled OT services to maximize safety and independence with necessary daily tasks.    PERFORMANCE DEFICITS: in functional skills including ADLs, IADLs, coordination, dexterity, sensation, edema, ROM, strength, pain, fascial restrictions, flexibility, Fine motor control, Gross motor control, balance, decreased knowledge of use of DME, skin integrity, and UE functional use, cognitive skills including attention and memory, and psychosocial skills including environmental adaptation, habits, and routines and behaviors.   IMPAIRMENTS: are limiting patient from ADLs, IADLs, and social participation.   COMORBIDITIES: may have co-morbidities  that affects occupational performance. Patient will benefit from skilled OT to address above impairments and improve overall function.  MODIFICATION OR ASSISTANCE TO COMPLETE EVALUATION: Min-Moderate modification of tasks or assist with assess necessary to complete an evaluation.  OT OCCUPATIONAL PROFILE AND HISTORY: Detailed assessment: Review of records and additional review of physical, cognitive, psychosocial history related to current functional performance.  CLINICAL DECISION MAKING: Moderate - several treatment options, min-mod task modification necessary  REHAB POTENTIAL: Good  EVALUATION COMPLEXITY: Moderate      PLAN:  OT FREQUENCY: 1-2x/week  OT DURATION: 6 weeks  PLANNED INTERVENTIONS: 97168 OT Re-evaluation, 97535 self care/ADL training, 84132 therapeutic exercise, 97530 therapeutic activity, 97112 neuromuscular re-education, 97140 manual therapy, 97035 ultrasound, 97018 paraffin, 44010  fluidotherapy, 97010 moist heat, 97010 cryotherapy, 97034 contrast bath, 97760 Orthotics management and training, 27253 Splinting (initial encounter), scar mobilization, passive range of motion, patient/family education, and DME and/or AE instructions  RECOMMENDED OTHER SERVICES: none currently  CONSULTED AND AGREED WITH PLAN OF CARE: Patient and family member/caregiver  PLAN FOR NEXT SESSION: continue with edema control measures, modify HEP as pt progresses.     Oletta Cohn, OTR/L,CLT 08/18/2023, 1:12 PM

## 2023-08-21 ENCOUNTER — Ambulatory Visit: Admitting: Occupational Therapy

## 2023-08-21 DIAGNOSIS — M25631 Stiffness of right wrist, not elsewhere classified: Secondary | ICD-10-CM

## 2023-08-21 DIAGNOSIS — M25641 Stiffness of right hand, not elsewhere classified: Secondary | ICD-10-CM

## 2023-08-21 DIAGNOSIS — M79641 Pain in right hand: Secondary | ICD-10-CM

## 2023-08-21 DIAGNOSIS — M25531 Pain in right wrist: Secondary | ICD-10-CM

## 2023-08-21 DIAGNOSIS — M6281 Muscle weakness (generalized): Secondary | ICD-10-CM

## 2023-08-21 DIAGNOSIS — R600 Localized edema: Secondary | ICD-10-CM

## 2023-08-21 NOTE — Therapy (Signed)
 OUTPATIENT OCCUPATIONAL THERAPY ORTHO TREATMENT  Patient Name: Teresa Lin MRN: [id number] DOB:[birth year], 89 y.o., female Today's Date: 08/21/2023  PCP: Curtis Sites, MD REFERRING PROVIDER: Anson [place], PA-C  END OF SESSION:  OT End of Session - 08/21/23 0950     Visit Number 4    Number of Visits 13    Date for OT Re-Evaluation 09/05/23    OT Start Time 0948    OT Stop Time 1035    OT Time Calculation (min) 47 min    Activity Tolerance Patient tolerated treatment well    Behavior During Therapy [facility] for tasks assessed/performed             Past Medical History:  Diagnosis Date   Anemia    Anxiety    Arthritis    osteoarthritis. Bilateral knee replacement, hands, meniscal tear, cervical disc disease   Atherosclerosis    Atypical chest pain    Collagen vascular disease (HCC)    Colon polyp 12/15/2017   Colon polyps    Compression fracture of L2 (HCC)    Diverticulosis    DVT (deep venous thrombosis) (HCC)    Fracture of one rib, right side, subsequent encounter for fracture with routine healing 03/08/2017   GERD (gastroesophageal reflux disease)    History of seronegative inflammatory arthritis    HOH (hard of hearing)    no hearing aids   Hypertension    IBS (irritable bowel syndrome)    Internal hemorrhoids    Phlebitis and thrombophlebitis of deep veins of lower extremities (HCC)    after surgery   Vision abnormalities    Past Surgical History:  Procedure Laterality Date   APPENDECTOMY     COLONOSCOPY N/A 01/24/2020   Procedure: COLONOSCOPY;  Surgeon: Regis Bill, MD;  Location: [facility] ENDOSCOPY;  Service: Endoscopy;  Laterality: N/A;   COLONOSCOPY WITH PROPOFOL N/A 10/24/2017   Procedure: COLONOSCOPY WITH PROPOFOL;  Surgeon: Scot Jun, MD;  Location: [facility] ENDOSCOPY;  Service: Endoscopy;  Laterality: N/A;   COLONOSCOPY WITH PROPOFOL N/A 11/21/2017   Procedure: COLONOSCOPY WITH PROPOFOL;  Surgeon: Scot Jun, MD;  Location: [facility]  ENDOSCOPY;  Service: Endoscopy;  Laterality: N/A;   DILATION AND CURETTAGE OF UTERUS     ESOPHAGOGASTRODUODENOSCOPY (EGD) WITH PROPOFOL N/A 02/25/2018   Procedure: ESOPHAGOGASTRODUODENOSCOPY (EGD) WITH PROPOFOL;  Surgeon: Scot Jun, MD;  Location: [facility] ENDOSCOPY;  Service: Endoscopy;  Laterality: N/A;   EYE SURGERY     bilateral catarACT   JOINT REPLACEMENT Bilateral    knee replacement   LAPAROSCOPIC RIGHT COLECTOMY Right 12/15/2017   Procedure: LAPAROSCOPIC RIGHT COLECTOMY;  Surgeon: Earline Mayotte, MD;  Location: [facility] ORS;  Service: General;  Laterality: Right;   ORIF HUMERUS FRACTURE Right 08/09/2021   Procedure: IRRIGATION AND DEBRIDEMENT; OPEN REDUCTION INTERNAL FIXATION (ORIF) DISTAL HUMERUS FRACTURE;  Surgeon: Myrene Galas, MD;  Location: [facility] OR;  Service: Orthopedics;  Laterality: Right;   REPLACEMENT TOTAL KNEE BILATERAL     TOTAL SHOULDER ARTHROPLASTY Right 09/24/2018   Procedure: TOTAL SHOULDER ARTHROPLASTY - REVERSE RIGHT;  Surgeon: Christena Flake, MD;  Location: [facility] ORS;  Service: Orthopedics;  Laterality: Right;   Patient Active Problem List   Diagnosis Date Noted   At risk for falling 02/13/2023   Fracture of cervical vertebra, C6 (HCC) 02/13/2023   T1 vertebral fracture (HCC) 02/13/2023   Rib fractures 02/13/2023   Lumbar transverse process fracture (HCC) 02/13/2023   Leukocytosis 02/13/2023   Normocytic anemia 08/11/2021   Hypokalemia  08/09/2021   Displaced comminuted supracondylar fracture of right humerus without intercondylar fracture 08/08/2021   Open fracture of right humerus 08/08/2021   History of DVT (deep vein thrombosis) 06/06/2021   Rheumatoid arthritis of multiple sites without rheumatoid factor (HCC) 04/24/2021   History of pulmonary embolism 02/04/2020   History of blood clotting disorder 01/12/2020   Immunosuppression (HCC) 01/25/2019   Status post reverse total shoulder replacement, right 09/24/2018   Hyperglycemia 08/31/2018   S/P right  colectomy 01/26/2018   Adenomatous polyp of ascending colon 11/24/2017   Generalized anxiety disorder 09/06/2017   Irritable bowel syndrome (IBS) 09/06/2017   Orthostasis 06/05/2016   Left shoulder pain 10/13/2015   Constipation 05/30/2015   Osteoporosis, postmenopausal 05/30/2015   Compression fracture of L2 vertebra with delayed healing 05/29/2015   Frequent falls 03/26/2015   Atherosclerosis of arteries 03/26/2015   Family history of colon cancer 09/28/2014   TMJ (temporomandibular joint syndrome) 06/12/2014   Varicose veins of both lower extremities without ulcer or inflammation 06/12/2014   B12 deficiency 09/20/2013   Fatigue 05/23/2013   Internal hemorrhoids without complication 04/20/2013   Insomnia 03/03/2013   Vertigo due to cerebrovascular disease 03/03/2013   Seronegative rheumatoid arthritis affecting lower leg (HCC) 03/03/2013    ONSET DATE: 06/07/2023  REFERRING DIAG: other fracture lower end right ulna, closed  THERAPY DIAG:  Pain in right wrist  Pain in right hand  Localized edema  Stiffness of right hand, not elsewhere classified  Stiffness of right wrist, not elsewhere classified  Muscle weakness (generalized)  Rationale for Evaluation and Treatment: Rehabilitation  SUBJECTIVE:   SUBJECTIVE STATEMENT: See note  Pt accompanied by:  caregiver, Delaney Meigs  PERTINENT HISTORY: Per chart at last MD appt with Dr. Joice Lofts, [name] is a 89 y.o. female who presents today for repeat evaluation status post a right wrist injury sustained on 06/06/2023. [place] and [place] landing directly on concrete, subsequent x-rays of the right wrist demonstrate evidence of a minimally displaced right distal ulna fracture. The patient was initially valued by me where she was placed into a short arm cast, she was last evaluated on 06/18/2023, she was instructed to follow-up with me in 1 week for repeat x-rays to ensure no shifting of the fracture was  identified. The patient however got sick and did not follow-up until today. The patient does still report some underlying discomfort in the right wrist, she does have a history of significant right wrist osteoarthritic changes. She is followed by rheumatology for Seronegative inflammatory arthritis. The patient has undergone infusions in the past for this condition. She denies any repeat trauma or injury for the right wrist. She denies any irritation from the short arm cast at today's visit.   PRECAUTIONS: None    WEIGHT BEARING RESTRICTIONS: No  PAIN:  Are you having pain?  7-9/10 pain in wrist and digits with certain motions FALLS: Has patient fallen in last 6 months? Yes. Number of falls unsure  LIVING ENVIRONMENT: Lives with: Lives alone Lives in: House/apartment Stairs: No    PLOF: Independent  PATIENT GOALS: Pt would like to decrease pain, increase ROM and be able to use hand functionally for daily tasks.    NEXT MD VISIT: unsure of next MD appt date  OBJECTIVE:  Note: Objective measures were completed at Evaluation unless otherwise noted.  HAND DOMINANCE: Right  ADLs: Overall ADLs: Pt reports she has difficulty and requires assistance with cutting food, pulling up her pants,  performing housework, managing buttons, gripping and opening containers, flossing her teeth and brushing her teeth.  She was living independently prior to fall but now has a caregiver to assist her 5 days a week from 10-3 daily.  She is not able to drive currently.     FUNCTIONAL OUTCOME MEASURES: To be assessed next session  UPPER EXTREMITY ROM:     Active ROM Right eval Left eval R 08/18/23  Shoulder flexion 90 90   Shoulder abduction 90 90   Shoulder adduction     Shoulder extension     Shoulder internal rotation     Shoulder external rotation     Elbow flexion 135 140   Elbow extension 0 0   Wrist flexion 47  35  Wrist extension 0  20  Wrist ulnar deviation     Wrist radial deviation      Wrist pronation 70  90  Wrist supination 50  75  (Blank rows = not tested)  Active ROM Right eval Left eval R 08/21/23  Thumb MCP (0-60) 45    Thumb IP (0-80) 35    Thumb Radial abd/add (0-55) 20   30  Thumb Palmar abd/add (0-45) 22   40  Thumb Opposition to Small Finger To small finger with pain Opposition to 5th pain in thumb    Index MCP (0-90) 65  80 75  Index PIP (0-100) 70  75 80  Index DIP (0-70)       Long MCP (0-90)  90  90 90  Long PIP (0-100)  65  55 60  Long DIP (0-70)       Ring MCP (0-90)  90  90 95  Ring PIP (0-100)  90  90 95  Ring DIP (0-70)       Little MCP (0-90)  95  95 95  Little PIP (0-100)  90  90 95  Little DIP (0-70)       (Blank rows = not tested)  HAND FUNCTION: Eval: Grip strength: Right: 3 lbs; Left: 38 lbs, Lateral pinch: Right:   lbs, Left:   lbs, and 3 point pinch: Right:   lbs, Left:   lbs 08/01/23: Grip strength: Right: 10 lbs; Left: 38 lbs, Lateral pinch: Right:   lbs, Left:   lbs, and 3 point pinch: Right:   lbs, Left:   lbs 08/18/23: Grip strength: Right: 19 lbs; Left: 38 lbs, Lateral pinch: Right:   8lbs, Left:   lbs, and 3 point pinch: Right:  4 lbs, Left:   lbs  COORDINATION: Impaired coordination with decreased functional use of right hand and wrist   SENSATION: Increased pain in wrist and hand, will continue to assess  EDEMA: edema present in right hand and wrist   COGNITION: Overall cognitive status:  Pt demonstrates difficulty at times recalling new learning and information, benefits from repetition Areas of impairment: Memory: Deficits short term memory  TREATMENT DATE: 08/21/2023          Patient continues to make progress in digit flexion.  As well as supination.  See flowsheet. Measurements compared to when patient was seen in 23. Patient has rheumatoid arthritis and her baseline is impaired Did encourage and reviewed with patient and caretaker to do some soft tissue massage to thumb webspace as well as metacarpal  spreads to focus on opening the the metacarpals for thumb palmar radial abduction.-Since being immobilized earlier this year in a cast and brace       Reviewed with  patient to do contrast if increased inflammation.  Otherwise if more stiffness and pain can do heat prior to motion                                                                                                              Modalities: Paraffin done time: 8 Location: right hand and wrist To decrease pain, increase tissue mobility and increase ROM   Manual Therapy:   Following fluidotherapy, pt seen for manual therapy for soft tissue massage of right hand, wrist.  Carpal and metacarpal spreads, webspace spread.  Reviewed with caretaker Done gentle pressure from clip in webspace for trigger point release prior to palmar radial abduction.   Therapeutic Exercise:  Patient can continue with tendon glides.  But mostly MC flexion and composite flexion pain-free gentle motion more than forcing it.   But focusing on thumb palmar radial abduction.  Reviewed with patient and caretaker again active assisted range of motion for wrist flexion extension as well as radial ulnar deviation stop when feeling pain.  10 reps Supination/pronation of forearm for 10 reps-focusing on forearm and not wrist.  Discussed with patient and caretaker exercises in the pool. Patient in the past did exercises in the pool that benefited her arthritis very much. Patient also to start weaning out of wrist splint 2 hours on 2 hours of gradually over the weekend. PATIENT EDUCATION: Education details: use of contrast for edema control, ROM exercises Person educated: Patient and Nurse, learning disability Education method: Explanation, Demonstration, Tactile cues, Verbal cues, and Handouts Education comprehension: verbalized understanding, verbal cues required, and needs further education  HOME EXERCISE PROGRAM: See above for details   GOALS: Goals reviewed with  patient? Yes  SHORT TERM GOALS: Target date: 08/12/2023  Pt will demonstrate the understanding of use of contrast for edema control of right wrist and hand. Baseline: minimal knowledge of contrast at eval  Goal status: INITIAL  2.  Pt will demonstrate oral hygiene with modified independence with use of built up handles on her toothbrush. Baseline: difficulty at eval  Goal status: INITIAL   LONG TERM GOALS: Target date: 09/05/2023  Pt will complete home exercise program with modified independence. Baseline: no current program Goal status: INITIAL  2.  Pt will improve grip strength by 10 pounds to manage opening containers with modified independence. Baseline: difficulty at eval  Goal status: INITIAL  3.  Pt will improve strength of RUE to pull up pants with modified independence. Baseline: difficulty at eval Goal status: INITIAL  4.  Pt will improve hand function to be able to cut food with modified independence. Baseline: unable to cut food at eval  Goal status: INITIAL  5.  Pt will demonstrate ability to perform buttons with modified independence. Baseline: difficulty with buttons at eval Goal status: INITIAL  6.  Pt will improve ROM to [facility] to manage opening doors. Baseline: limited ROM at eval, difficulty managing doors. Goal status: INITIAL  ASSESSMENT:  CLINICAL IMPRESSION: Patient seen for occupational therapy for right closed fracture of the lower  end of the right ulna. Pt with a history of arthritis which continues to affect her hand function.  Patient was seen by this OT in 2023.  Patient is range of motion and strength in right wrist and hand was assessed and compared to 2023.  Patient's digit range of motion about normal for patient's baseline.  Pt remains limited in wrist extension/flexion as well as supination.  Reviewed with patient and caretaker home exercises as well as home safety to decrease risk of falling at home. Focusing on thumb PA and RA - decrease  tightness in webspace - and discuss today exercising in the pool - done in the past-  Patient needed reviewing of exercises.  Pt responds well to re instruction of exercises as well as repetition for carry over.  Pt would benefit from skilled OT services to maximize safety and independence with necessary daily tasks.    PERFORMANCE DEFICITS: in functional skills including ADLs, IADLs, coordination, dexterity, sensation, edema, ROM, strength, pain, fascial restrictions, flexibility, Fine motor control, Gross motor control, balance, decreased knowledge of use of DME, skin integrity, and UE functional use, cognitive skills including attention and memory, and psychosocial skills including environmental adaptation, habits, and routines and behaviors.   IMPAIRMENTS: are limiting patient from ADLs, IADLs, and social participation.   COMORBIDITIES: may have co-morbidities  that affects occupational performance. Patient will benefit from skilled OT to address above impairments and improve overall function.  MODIFICATION OR ASSISTANCE TO COMPLETE EVALUATION: Min-Moderate modification of tasks or assist with assess necessary to complete an evaluation.  OT OCCUPATIONAL PROFILE AND HISTORY: Detailed assessment: Review of records and additional review of physical, cognitive, psychosocial history related to current functional performance.  CLINICAL DECISION MAKING: Moderate - several treatment options, min-mod task modification necessary  REHAB POTENTIAL: Good  EVALUATION COMPLEXITY: Moderate      PLAN:  OT FREQUENCY: 1-2x/week  OT DURATION: 6 weeks  PLANNED INTERVENTIONS: 97168 OT Re-evaluation, 97535 self care/ADL training, 16109 therapeutic exercise, 97530 therapeutic activity, 97112 neuromuscular re-education, 97140 manual therapy, 97035 ultrasound, 97018 paraffin, 60454 fluidotherapy, 97010 moist heat, 97010 cryotherapy, 97034 contrast bath, 97760 Orthotics management and training, 09811 Splinting  (initial encounter), scar mobilization, passive range of motion, patient/family education, and DME and/or AE instructions  RECOMMENDED OTHER SERVICES: none currently  CONSULTED AND AGREED WITH PLAN OF CARE: Patient and family member/caregiver  PLAN FOR NEXT SESSION: continue with edema control measures, modify HEP as pt progresses.     Oletta Cohn, OTR/L,CLT 08/21/2023, 2:02 PM

## 2023-08-26 ENCOUNTER — Ambulatory Visit: Admitting: Occupational Therapy

## 2023-08-26 DIAGNOSIS — M25631 Stiffness of right wrist, not elsewhere classified: Secondary | ICD-10-CM

## 2023-08-26 DIAGNOSIS — M25531 Pain in right wrist: Secondary | ICD-10-CM | POA: Diagnosis not present

## 2023-08-26 DIAGNOSIS — M6281 Muscle weakness (generalized): Secondary | ICD-10-CM

## 2023-08-26 DIAGNOSIS — M79641 Pain in right hand: Secondary | ICD-10-CM

## 2023-08-26 DIAGNOSIS — R600 Localized edema: Secondary | ICD-10-CM

## 2023-08-26 DIAGNOSIS — M25641 Stiffness of right hand, not elsewhere classified: Secondary | ICD-10-CM

## 2023-08-26 NOTE — Therapy (Signed)
 OUTPATIENT OCCUPATIONAL THERAPY ORTHO TREATMENT  Patient Name: Teresa Lin MRN: [id number] DOB:[birth year], 89 y.o., female Today's Date: 08/26/2023  PCP: Curtis Sites, MD REFERRING PROVIDER: Anson [place], PA-C  END OF SESSION:  OT End of Session - 08/26/23 1035     Visit Number 5    Number of Visits 13    Date for OT Re-Evaluation 09/05/23    OT Start Time 1035    Activity Tolerance Patient tolerated treatment well    Behavior During Therapy [facility] for tasks assessed/performed             Past Medical History:  Diagnosis Date   Anemia    Anxiety    Arthritis    osteoarthritis. Bilateral knee replacement, hands, meniscal tear, cervical disc disease   Atherosclerosis    Atypical chest pain    Collagen vascular disease (HCC)    Colon polyp 12/15/2017   Colon polyps    Compression fracture of L2 (HCC)    Diverticulosis    DVT (deep venous thrombosis) (HCC)    Fracture of one rib, right side, subsequent encounter for fracture with routine healing 03/08/2017   GERD (gastroesophageal reflux disease)    History of seronegative inflammatory arthritis    HOH (hard of hearing)    no hearing aids   Hypertension    IBS (irritable bowel syndrome)    Internal hemorrhoids    Phlebitis and thrombophlebitis of deep veins of lower extremities (HCC)    after surgery   Vision abnormalities    Past Surgical History:  Procedure Laterality Date   APPENDECTOMY     COLONOSCOPY N/A 01/24/2020   Procedure: COLONOSCOPY;  Surgeon: Regis Bill, MD;  Location: [facility] ENDOSCOPY;  Service: Endoscopy;  Laterality: N/A;   COLONOSCOPY WITH PROPOFOL N/A 10/24/2017   Procedure: COLONOSCOPY WITH PROPOFOL;  Surgeon: Scot Jun, MD;  Location: [facility] ENDOSCOPY;  Service: Endoscopy;  Laterality: N/A;   COLONOSCOPY WITH PROPOFOL N/A 11/21/2017   Procedure: COLONOSCOPY WITH PROPOFOL;  Surgeon: Scot Jun, MD;  Location: [facility] ENDOSCOPY;  Service: Endoscopy;  Laterality: N/A;    DILATION AND CURETTAGE OF UTERUS     ESOPHAGOGASTRODUODENOSCOPY (EGD) WITH PROPOFOL N/A 02/25/2018   Procedure: ESOPHAGOGASTRODUODENOSCOPY (EGD) WITH PROPOFOL;  Surgeon: Scot Jun, MD;  Location: [facility] ENDOSCOPY;  Service: Endoscopy;  Laterality: N/A;   EYE SURGERY     bilateral catarACT   JOINT REPLACEMENT Bilateral    knee replacement   LAPAROSCOPIC RIGHT COLECTOMY Right 12/15/2017   Procedure: LAPAROSCOPIC RIGHT COLECTOMY;  Surgeon: Earline Mayotte, MD;  Location: [facility] ORS;  Service: General;  Laterality: Right;   ORIF HUMERUS FRACTURE Right 08/09/2021   Procedure: IRRIGATION AND DEBRIDEMENT; OPEN REDUCTION INTERNAL FIXATION (ORIF) DISTAL HUMERUS FRACTURE;  Surgeon: Myrene Galas, MD;  Location: [facility] OR;  Service: Orthopedics;  Laterality: Right;   REPLACEMENT TOTAL KNEE BILATERAL     TOTAL SHOULDER ARTHROPLASTY Right 09/24/2018   Procedure: TOTAL SHOULDER ARTHROPLASTY - REVERSE RIGHT;  Surgeon: Christena Flake, MD;  Location: [facility] ORS;  Service: Orthopedics;  Laterality: Right;   Patient Active Problem List   Diagnosis Date Noted   At risk for falling 02/13/2023   Fracture of cervical vertebra, C6 (HCC) 02/13/2023   T1 vertebral fracture (HCC) 02/13/2023   Rib fractures 02/13/2023   Lumbar transverse process fracture (HCC) 02/13/2023   Leukocytosis 02/13/2023   Normocytic anemia 08/11/2021   Hypokalemia 08/09/2021   Displaced comminuted supracondylar fracture of right humerus without intercondylar fracture 08/08/2021  Open fracture of right humerus 08/08/2021   History of DVT (deep vein thrombosis) 06/06/2021   Rheumatoid arthritis of multiple sites without rheumatoid factor (HCC) 04/24/2021   History of pulmonary embolism 02/04/2020   History of blood clotting disorder 01/12/2020   Immunosuppression (HCC) 01/25/2019   Status post reverse total shoulder replacement, right 09/24/2018   Hyperglycemia 08/31/2018   S/P right colectomy 01/26/2018   Adenomatous polyp of ascending  colon 11/24/2017   Generalized anxiety disorder 09/06/2017   Irritable bowel syndrome (IBS) 09/06/2017   Orthostasis 06/05/2016   Left shoulder pain 10/13/2015   Constipation 05/30/2015   Osteoporosis, postmenopausal 05/30/2015   Compression fracture of L2 vertebra with delayed healing 05/29/2015   Frequent falls 03/26/2015   Atherosclerosis of arteries 03/26/2015   Family history of colon cancer 09/28/2014   TMJ (temporomandibular joint syndrome) 06/12/2014   Varicose veins of both lower extremities without ulcer or inflammation 06/12/2014   B12 deficiency 09/20/2013   Fatigue 05/23/2013   Internal hemorrhoids without complication 04/20/2013   Insomnia 03/03/2013   Vertigo due to cerebrovascular disease 03/03/2013   Seronegative rheumatoid arthritis affecting lower leg (HCC) 03/03/2013    ONSET DATE: 06/07/2023  REFERRING DIAG: other fracture lower end right ulna, closed  THERAPY DIAG:  Pain in right wrist  Pain in right hand  Localized edema  Stiffness of right hand, not elsewhere classified  Stiffness of right wrist, not elsewhere classified  Muscle weakness (generalized)  Rationale for Evaluation and Treatment: Rehabilitation  SUBJECTIVE:   SUBJECTIVE STATEMENT: See note  Pt accompanied by:  caregiver, Delaney Meigs  PERTINENT HISTORY: Per chart at last MD appt with Dr. Joice Lofts, Teresa Lin is a 89 y.o. female who presents today for repeat evaluation status post a right wrist injury sustained on 06/06/2023. [place] landing directly on concrete, subsequent x-rays of the right wrist demonstrate evidence of a minimally displaced right distal ulna fracture. The patient was initially valued by me where she was placed into a short arm cast, she was last evaluated on 06/18/2023, she was instructed to follow-up with me in 1 week for repeat x-rays to ensure no shifting of the fracture was identified. The patient however got sick and did not  follow-up until today. The patient does still report some underlying discomfort in the right wrist, she does have a history of significant right wrist osteoarthritic changes. She is followed by rheumatology for Seronegative inflammatory arthritis. The patient has undergone infusions in the past for this condition. She denies any repeat trauma or injury for the right wrist. She denies any irritation from the short arm cast at today's visit.   PRECAUTIONS: None    WEIGHT BEARING RESTRICTIONS: No  PAIN:  Are you having pain?  7-9/10 pain in wrist and digits with certain motions FALLS: Has patient fallen in last 6 months? Yes. Number of falls unsure  LIVING ENVIRONMENT: Lives with: Lives alone Lives in: House/apartment Stairs: No    PLOF: Independent  PATIENT GOALS: Pt would like to decrease pain, increase ROM and be able to use hand functionally for daily tasks.    NEXT MD VISIT: unsure of next MD appt date  OBJECTIVE:  Note: Objective measures were completed at Evaluation unless otherwise noted.  HAND DOMINANCE: Right  ADLs: Overall ADLs: Pt reports she has difficulty and requires assistance with cutting food, pulling up her pants, performing housework, managing buttons, gripping and opening containers, flossing her teeth and brushing her teeth.  She was living independently prior to fall but now has a caregiver to assist her 5 days a week from 10-3 daily.  She is not able to drive currently.     FUNCTIONAL OUTCOME MEASURES: To be assessed next session  UPPER EXTREMITY ROM:     Active ROM Right eval Left eval R 08/18/23  Shoulder flexion 90 90   Shoulder abduction 90 90   Shoulder adduction     Shoulder extension     Shoulder internal rotation     Shoulder external rotation     Elbow flexion 135 140   Elbow extension 0 0   Wrist flexion 47  35  Wrist extension 0  20  Wrist ulnar deviation     Wrist radial deviation     Wrist pronation 70  90  Wrist supination 50  75   (Blank rows = not tested)  Active ROM Right eval Left eval R 08/21/23  Thumb MCP (0-60) 45    Thumb IP (0-80) 35    Thumb Radial abd/add (0-55) 20   30  Thumb Palmar abd/add (0-45) 22   40  Thumb Opposition to Small Finger To small finger with pain Opposition to 5th pain in thumb    Index MCP (0-90) 65  80 75  Index PIP (0-100) 70  75 80  Index DIP (0-70)       Long MCP (0-90)  90  90 90  Long PIP (0-100)  65  55 60  Long DIP (0-70)       Ring MCP (0-90)  90  90 95  Ring PIP (0-100)  90  90 95  Ring DIP (0-70)       Little MCP (0-90)  95  95 95  Little PIP (0-100)  90  90 95  Little DIP (0-70)       (Blank rows = not tested)  HAND FUNCTION: Eval: Grip strength: Right: 3 lbs; Left: 38 lbs, Lateral pinch: Right:   lbs, Left:   lbs, and 3 point pinch: Right:   lbs, Left:   lbs 08/01/23: Grip strength: Right: 10 lbs; Left: 38 lbs, Lateral pinch: Right:   lbs, Left:   lbs, and 3 point pinch: Right:   lbs, Left:   lbs 08/18/23: Grip strength: Right: 19 lbs; Left: 38 lbs, Lateral pinch: Right:   8lbs, Left:   lbs, and 3 point pinch: Right:  4 lbs, Left:   lbs 08/26/23: Grip strength: Right: 21 lbs; Left: 38 lbs, Lateral pinch: Right:   9lbs, Left:   lbs, and 3 point pinch: Right:  6 lbs, Left:   lbs  COORDINATION: Impaired coordination with decreased functional use of right hand and wrist   SENSATION: Eval Increased pain in wrist and hand, will continue to assess  EDEMA:  eval edema present in right hand and wrist   COGNITION: Overall cognitive status:  Pt demonstrates difficulty at times recalling new learning and information, benefits from repetition Areas of impairment: Memory: Deficits short term memory  TREATMENT DATE: 08/26/2023          Patient continues to make progress in functional use of R hand in ADL's-  Grip and prehension assess with CMC neoprene splint on - pt had decrease pain at thumb and radial hand and wrist with it on - encourage for pt to use at home during  day - cont to wean out of hard splint  Measurements taken   See flowsheet. Cont to compare Measurements to  when patient was seen in 23. Patient has rheumatoid arthritis and her baseline is impaired Did encourage and reviewed with patient and caretaker to  cont to do some soft tissue massage to thumb webspace as well as metacarpal spreads to focus on opening the the metacarpals for thumb palmar radial abduction.-Since being immobilized earlier this year in a cast and brace   Great success     Reviewed with patient to do contrast if increased inflammation.  Otherwise if more stiffness and pain can do heat prior to motion Assess patient's use of right hand in pushing and pulling heavy door.  Pulling hard as to use her body body and pushing unable. Able to push up from chair and carry 2 pounds.  Had increased pain with simulating 3 pounds carrying groceries and pouring a drink. Fitted patient with a CMC neoprene to use during the day with functional use to decrease pain and increased functional strength.  1 pound weight with CMC neoprene on reviewed with patient and at home exercises for radial ulnar deviation, supination and pronation, elbow flexion extension, scapular squeezes or rowing.  And forward punches.  12 reps 1 time a day.  Tolerating well.                                                                                                             Modalities: Paraffin done time: 8 Location: right hand and wrist To decrease pain, increase tissue mobility and increase ROM      Therapeutic Exercise:  Patient can continue with tendon glides.  But mostly MC flexion and composite flexion pain-free gentle motion more than forcing it.   But focusing on thumb palmar radial abduction.  Reviewed with patient and caretaker again active assisted range of motion for wrist flexion extension as well as radial ulnar deviation stop when feeling pain.  10 reps Supination/pronation of forearm for 10  reps-focusing on forearm and not wrist.   PATIENT EDUCATION: Education details: use of contrast for edema control, ROM exercises Person educated: Patient and Nurse, learning disability Education method: Explanation, Demonstration, Tactile cues, Verbal cues, and Handouts Education comprehension: verbalized understanding, verbal cues required, and needs further education  HOME EXERCISE PROGRAM: See above for details   GOALS: Goals reviewed with patient? Yes  SHORT TERM GOALS: Target date: 08/12/2023  Pt will demonstrate the understanding of use of contrast for edema control of right wrist and hand. Baseline: minimal knowledge of contrast at eval  Goal status: INITIAL  2.  Pt will demonstrate oral hygiene with modified independence with use of built up handles on her toothbrush. Baseline: difficulty at eval  Goal status: INITIAL   LONG TERM GOALS: Target date: 09/05/2023  Pt will complete home exercise program with modified independence. Baseline: no current program Goal status: INITIAL  2.  Pt will improve grip strength by 10 pounds to manage opening containers with modified independence. Baseline: difficulty at eval  Goal status: INITIAL  3.  Pt will improve strength of RUE to pull up pants with modified independence. Baseline: difficulty  at eval Goal status: INITIAL  4.  Pt will improve hand function to be able to cut food with modified independence. Baseline: unable to cut food at eval  Goal status: INITIAL  5.  Pt will demonstrate ability to perform buttons with modified independence. Baseline: difficulty with buttons at eval Goal status: INITIAL  6.  Pt will improve ROM to [facility] to manage opening doors. Baseline: limited ROM at eval, difficulty managing doors. Goal status: INITIAL  ASSESSMENT:  CLINICAL IMPRESSION: Patient seen for occupational therapy for right closed fracture of the lower end of the right ulna. Pt with a history of arthritis which continues to affect  her hand function.  Patient was seen by this OT in 2023.  Patient is range of motion and strength in right wrist and hand was assessed and compared to 2023.  Patient's digit range of motion about normal for patient's baseline.  Pt remains limited in wrist extension/flexion as well as supination.  Reviewed with patient and caretaker home exercises as well as home safety to decrease risk of falling at home. Focusing on thumb PA and RA - decrease tightness in webspace -patient was fitted with a CMC neoprene today to wear for functional strengthening during the day with less pain.  Tolerating really well and was able to add 1 pound weight for wrist, forearm and elbow and shoulder strengthening.  Patient needed reviewing of exercises.  Pt responds well to re instruction of exercises as well as repetition for carry over.  Pt would benefit from skilled OT services to maximize safety and independence with necessary daily tasks.    PERFORMANCE DEFICITS: in functional skills including ADLs, IADLs, coordination, dexterity, sensation, edema, ROM, strength, pain, fascial restrictions, flexibility, Fine motor control, Gross motor control, balance, decreased knowledge of use of DME, skin integrity, and UE functional use, cognitive skills including attention and memory, and psychosocial skills including environmental adaptation, habits, and routines and behaviors.   IMPAIRMENTS: are limiting patient from ADLs, IADLs, and social participation.   COMORBIDITIES: may have co-morbidities  that affects occupational performance. Patient will benefit from skilled OT to address above impairments and improve overall function.  MODIFICATION OR ASSISTANCE TO COMPLETE EVALUATION: Min-Moderate modification of tasks or assist with assess necessary to complete an evaluation.  OT OCCUPATIONAL PROFILE AND HISTORY: Detailed assessment: Review of records and additional review of physical, cognitive, psychosocial history related to current  functional performance.  CLINICAL DECISION MAKING: Moderate - several treatment options, min-mod task modification necessary  REHAB POTENTIAL: Good  EVALUATION COMPLEXITY: Moderate      PLAN:  OT FREQUENCY: 1-2x/week  OT DURATION: 6 weeks  PLANNED INTERVENTIONS: 97168 OT Re-evaluation, 97535 self care/ADL training, 09811 therapeutic exercise, 97530 therapeutic activity, 97112 neuromuscular re-education, 97140 manual therapy, 97035 ultrasound, 97018 paraffin, 91478 fluidotherapy, 97010 moist heat, 97010 cryotherapy, 97034 contrast bath, 97760 Orthotics management and training, 29562 Splinting (initial encounter), scar mobilization, passive range of motion, patient/family education, and DME and/or AE instructions  RECOMMENDED OTHER SERVICES: none currently  CONSULTED AND AGREED WITH PLAN OF CARE: Patient and family member/caregiver  PLAN FOR NEXT SESSION: continue with edema control measures, modify HEP as pt progresses.     Heloise Lobo, OTR/L,CLT 08/26/2023, 10:36 AM

## 2023-08-29 ENCOUNTER — Ambulatory Visit: Admitting: Occupational Therapy

## 2023-08-29 DIAGNOSIS — R600 Localized edema: Secondary | ICD-10-CM

## 2023-08-29 DIAGNOSIS — M25631 Stiffness of right wrist, not elsewhere classified: Secondary | ICD-10-CM

## 2023-08-29 DIAGNOSIS — M6281 Muscle weakness (generalized): Secondary | ICD-10-CM

## 2023-08-29 DIAGNOSIS — M25531 Pain in right wrist: Secondary | ICD-10-CM

## 2023-08-29 DIAGNOSIS — M25641 Stiffness of right hand, not elsewhere classified: Secondary | ICD-10-CM

## 2023-08-29 DIAGNOSIS — M79641 Pain in right hand: Secondary | ICD-10-CM

## 2023-08-29 NOTE — Therapy (Signed)
 OUTPATIENT OCCUPATIONAL THERAPY ORTHO TREATMENT  Patient Name: Teresa Lin MRN: [id number] DOB:28-Apr-1932, 89 y.o., female Today's Date: 08/29/2023  PCP: Teresa Battles, MD REFERRING PROVIDER: Rojelio Clement, PA-C  END OF SESSION:  OT End of Session - 08/29/23 1244     Visit Number 6    Number of Visits 13    Date for OT Re-Evaluation 09/05/23    OT Start Time 1030    OT Stop Time 1117    OT Time Calculation (min) 47 min             Past Medical History:  Diagnosis Date   Anemia    Anxiety    Arthritis    osteoarthritis. Bilateral knee replacement, hands, meniscal tear, cervical disc disease   Atherosclerosis    Atypical chest pain    Collagen vascular disease (HCC)    Colon polyp 12/15/2017   Colon polyps    Compression fracture of L2 (HCC)    Diverticulosis    DVT (deep venous thrombosis) (HCC)    Fracture of one rib, right side, subsequent encounter for fracture with routine healing 03/08/2017   GERD (gastroesophageal reflux disease)    History of seronegative inflammatory arthritis    HOH (hard of hearing)    no hearing aids   Hypertension    IBS (irritable bowel syndrome)    Internal hemorrhoids    Phlebitis and thrombophlebitis of deep veins of lower extremities (HCC)    after surgery   Vision abnormalities    Past Surgical History:  Procedure Laterality Date   APPENDECTOMY     COLONOSCOPY N/A 01/24/2020   Procedure: COLONOSCOPY;  Surgeon: Teresa Darling, MD;  Location: [facility] ENDOSCOPY;  Service: Endoscopy;  Laterality: N/A;   COLONOSCOPY WITH PROPOFOL  N/A 10/24/2017   Procedure: COLONOSCOPY WITH PROPOFOL ;  Surgeon: Teresa Click, MD;  Location: [facility] ENDOSCOPY;  Service: Endoscopy;  Laterality: N/A;   COLONOSCOPY WITH PROPOFOL  N/A 11/21/2017   Procedure: COLONOSCOPY WITH PROPOFOL ;  Surgeon: Teresa Click, MD;  Location: [facility] ENDOSCOPY;  Service: Endoscopy;  Laterality: N/A;   DILATION AND CURETTAGE OF UTERUS      ESOPHAGOGASTRODUODENOSCOPY (EGD) WITH PROPOFOL  N/A 02/25/2018   Procedure: ESOPHAGOGASTRODUODENOSCOPY (EGD) WITH PROPOFOL ;  Surgeon: Teresa Click, MD;  Location: [facility] ENDOSCOPY;  Service: Endoscopy;  Laterality: N/A;   EYE SURGERY     bilateral catarACT   JOINT REPLACEMENT Bilateral    knee replacement   LAPAROSCOPIC RIGHT COLECTOMY Right 12/15/2017   Procedure: LAPAROSCOPIC RIGHT COLECTOMY;  Surgeon: Teresa Skeeter, MD;  Location: [facility] ORS;  Service: General;  Laterality: Right;   ORIF HUMERUS FRACTURE Right 08/09/2021   Procedure: IRRIGATION AND DEBRIDEMENT; OPEN REDUCTION INTERNAL FIXATION (ORIF) DISTAL HUMERUS FRACTURE;  Surgeon: Teresa Lia, MD;  Location: [facility] OR;  Service: Orthopedics;  Laterality: Right;   REPLACEMENT TOTAL KNEE BILATERAL     TOTAL SHOULDER ARTHROPLASTY Right 09/24/2018   Procedure: TOTAL SHOULDER ARTHROPLASTY - REVERSE RIGHT;  Surgeon: Teresa Hahn, MD;  Location: [facility] ORS;  Service: Orthopedics;  Laterality: Right;   Patient Active Problem List   Diagnosis Date Noted   At risk for falling 02/13/2023   Fracture of cervical vertebra, C6 (HCC) 02/13/2023   T1 vertebral fracture (HCC) 02/13/2023   Rib fractures 02/13/2023   Lumbar transverse process fracture (HCC) 02/13/2023   Leukocytosis 02/13/2023   Normocytic anemia 08/11/2021   Hypokalemia 08/09/2021   Displaced comminuted supracondylar fracture of right humerus without intercondylar fracture 08/08/2021   Open fracture of  right humerus 08/08/2021   History of DVT (deep vein thrombosis) 06/06/2021   Rheumatoid arthritis of multiple sites without rheumatoid factor (HCC) 04/24/2021   History of pulmonary embolism 02/04/2020   History of blood clotting disorder 01/12/2020   Immunosuppression (HCC) 01/25/2019   Status post reverse total shoulder replacement, right 09/24/2018   Hyperglycemia 08/31/2018   S/P right colectomy 01/26/2018   Adenomatous polyp of ascending colon 11/24/2017   Generalized  anxiety disorder 09/06/2017   Irritable bowel syndrome (IBS) 09/06/2017   Orthostasis 06/05/2016   Left shoulder pain 10/13/2015   Constipation 05/30/2015   Osteoporosis, postmenopausal 05/30/2015   Compression fracture of L2 vertebra with delayed healing 05/29/2015   Frequent falls 03/26/2015   Atherosclerosis of arteries 03/26/2015   Family history of colon cancer 09/28/2014   TMJ (temporomandibular joint syndrome) 06/12/2014   Varicose veins of both lower extremities without ulcer or inflammation 06/12/2014   B12 deficiency 09/20/2013   Fatigue 05/23/2013   Internal hemorrhoids without complication 04/20/2013   Insomnia 03/03/2013   Vertigo due to cerebrovascular disease 03/03/2013   Seronegative rheumatoid arthritis affecting lower leg (HCC) 03/03/2013    ONSET DATE: 06/07/2023  REFERRING DIAG: other fracture lower end right ulna, closed  THERAPY DIAG:  Pain in right wrist  Pain in right hand  Localized edema  Stiffness of right hand, not elsewhere classified  Stiffness of right wrist, not elsewhere classified  Muscle weakness (generalized)  Rationale for Evaluation and Treatment: Rehabilitation  SUBJECTIVE:   SUBJECTIVE STATEMENT: See note  Pt accompanied by:  caregiver, Teresa Lin  PERTINENT HISTORY: Per chart at last MD appt with Teresa Lin, Teresa Lin is a 89 y.o. female who presents today for repeat evaluation status post a right wrist injury sustained on 06/06/2023. [place] landing directly on concrete, subsequent x-rays of the right wrist demonstrate evidence of a minimally displaced right distal ulna fracture. The patient was initially valued by me where she was placed into a short arm cast, she was last evaluated on 06/18/2023, she was instructed to follow-up with me in 1 week for repeat x-rays to ensure no shifting of the fracture was identified. The patient however got sick and did not follow-up until today. The patient  does still report some underlying discomfort in the right wrist, she does have a history of significant right wrist osteoarthritic changes. She is followed by rheumatology for Seronegative inflammatory arthritis. The patient has undergone infusions in the past for this condition. She denies any repeat trauma or injury for the right wrist. She denies any irritation from the short arm cast at today's visit.   PRECAUTIONS: None    WEIGHT BEARING RESTRICTIONS: No  PAIN:  Are you having pain?  7-9/10 pain in wrist and digits with certain motions FALLS: Has patient fallen in last 6 months? Yes. Number of falls unsure  LIVING ENVIRONMENT: Lives with: Lives alone Lives in: House/apartment Stairs: No    PLOF: Independent  PATIENT GOALS: Pt would like to decrease pain, increase ROM and be able to use hand functionally for daily tasks.    NEXT MD VISIT: unsure of next MD appt date  OBJECTIVE:  Note: Objective measures were completed at Evaluation unless otherwise noted.  HAND DOMINANCE: Right  ADLs: Overall ADLs: Pt reports she has difficulty and requires assistance with cutting food, pulling up her pants, performing housework, managing buttons, gripping and opening containers, flossing her teeth and brushing her teeth.  She was living  independently prior to fall but now has a caregiver to assist her 5 days a week from 10-3 daily.  She is not able to drive currently.     FUNCTIONAL OUTCOME MEASURES: To be assessed next session  UPPER EXTREMITY ROM:     Active ROM Right eval Left eval R 08/18/23 R 08/29/23  Shoulder flexion 90 90    Shoulder abduction 90 90    Shoulder adduction      Shoulder extension      Shoulder internal rotation      Shoulder external rotation      Elbow flexion 135 140    Elbow extension 0 0    Wrist flexion 47  35 30 after heat 44  Wrist extension 0  20 30  Wrist ulnar deviation    20 after heat 30  Wrist radial deviation    0  Wrist pronation 70  90    Wrist supination 50  75   (Blank rows = not tested)  Active ROM Right eval Left eval R 08/21/23  Thumb MCP (0-60) 45    Thumb IP (0-80) 35    Thumb Radial abd/add (0-55) 20   30  Thumb Palmar abd/add (0-45) 22   40  Thumb Opposition to Small Finger To small finger with pain Opposition to 5th pain in thumb    Index MCP (0-90) 65  80 75  Index PIP (0-100) 70  75 80  Index DIP (0-70)       Long MCP (0-90)  90  90 90  Long PIP (0-100)  65  55 60  Long DIP (0-70)       Ring MCP (0-90)  90  90 95  Ring PIP (0-100)  90  90 95  Ring DIP (0-70)       Little MCP (0-90)  95  95 95  Little PIP (0-100)  90  90 95  Little DIP (0-70)       (Blank rows = not tested)  HAND FUNCTION: Eval: Grip strength: Right: 3 lbs; Left: 38 lbs, Lateral pinch: Right:   lbs, Left:   lbs, and 3 point pinch: Right:   lbs, Left:   lbs 08/01/23: Grip strength: Right: 10 lbs; Left: 38 lbs, Lateral pinch: Right:   lbs, Left:   lbs, and 3 point pinch: Right:   lbs, Left:   lbs 08/18/23: Grip strength: Right: 19 lbs; Left: 38 lbs, Lateral pinch: Right:   8lbs, Left:   lbs, and 3 point pinch: Right:  4 lbs, Left:   lbs 08/26/23: Grip strength: Right: 21 lbs; Left: 38 lbs, Lateral pinch: Right:   9lbs, Left:   lbs, and 3 point pinch: Right:  6 lbs, Left:   lbs  COORDINATION: Impaired coordination with decreased functional use of right hand and wrist   SENSATION: Eval Increased pain in wrist and hand, will continue to assess  EDEMA:  eval edema present in right hand and wrist   COGNITION: Overall cognitive status:  Pt demonstrates difficulty at times recalling new learning and information, benefits from repetition Areas of impairment: Memory: Deficits short term memory  TREATMENT DATE: 08/29/2023          Patient arrive with asking OT to review home exercises with 1 pound weight and yellow Thera-Band. Reviewed with patient and redone her written instructions to flow better from shoulder to elbow to wrist forearm.   Using 1 pound weight. And then also yellow Thera-Band. 1 pound weight with [facility]  neoprene radial ulnar deviation, supination and pronation, elbow flexion extension, scapular squeezes or rowing.  And forward punches.  12 reps 1 time a day.  Tolerating well. Reviewed with patient gripping and performing home exercises without irritating her rheumatoid arthritis as well as her pain in wrist, digits and thumb. Remind patient that she can wear during the day the [facility] neoprene with her home exercises as well as functional use to decrease pain in the thumb and wrist while getting some functional strengthening and grip and prehension.   Measurements taken   See flowsheet. Cont to compare Measurements to when patient was seen in 23. Patient has rheumatoid arthritis and her baseline is impaired   Reviewed with patient and caregiver to do contrast if increased inflammation.  Otherwise if more stiffness and pain can do heat prior to motion Show for patient and reinforced importance of moist heat prior to home exercises to continue to improve and maintain her range of motion in the wrist and fingers.                                                                                                                Modalities: Fluidotherapy time: 8 Location: right hand and wrist To decrease pain, increase tissue mobility and increase ROM  Patient show increase ulnar deviation and wrist flexion after  fluido     Therapeutic Exercise:  Patient can continue with tendon glides.  But mostly MC flexion and composite flexion pain-free gentle motion more than forcing it.   But focusing on thumb palmar radial abduction. Reviewed opposition to all digits with picking up 2 cm object to decrease pain. 10 reps  Reviewed with patient and caretaker again active assisted range of motion for wrist flexion extension as well as radial ulnar deviation stop when feeling pain.  10 reps    PATIENT EDUCATION: Education  details: use of contrast for edema control, ROM exercises Person educated: Patient and Nurse, learning disability Education method: Explanation, Demonstration, Tactile cues, Verbal cues, and Handouts Education comprehension: verbalized understanding, verbal cues required, and needs further education  HOME EXERCISE PROGRAM: See above for details   GOALS: Goals reviewed with patient? Yes  SHORT TERM GOALS: Target date: 08/12/2023  Pt will demonstrate the understanding of use of contrast for edema control of right wrist and hand. Baseline: minimal knowledge of contrast at eval  Goal status: INITIAL  2.  Pt will demonstrate oral hygiene with modified independence with use of built up handles on her toothbrush. Baseline: difficulty at eval  Goal status: INITIAL   LONG TERM GOALS: Target date: 09/05/2023  Pt will complete home exercise program with modified independence. Baseline: no current program Goal status: INITIAL  2.  Pt will improve grip strength by 10 pounds to manage opening containers with modified independence. Baseline: difficulty at eval  Goal status: INITIAL  3.  Pt will improve strength of RUE to pull up pants with modified independence. Baseline: difficulty at eval Goal status: INITIAL  4.  Pt will improve hand function to  be able to cut food with modified independence. Baseline: unable to cut food at eval  Goal status: INITIAL  5.  Pt will demonstrate ability to perform buttons with modified independence. Baseline: difficulty with buttons at eval Goal status: INITIAL  6.  Pt will improve ROM to [facility] to manage opening doors. Baseline: limited ROM at eval, difficulty managing doors. Goal status: INITIAL  ASSESSMENT:  CLINICAL IMPRESSION: Patient seen for occupational therapy for right closed fracture of the lower end of the right ulna. Pt with a history of arthritis which continues to affect her hand function.  Patient was seen by this OT in 2023.  Patient is range  of motion and strength in right wrist and hand was assessed and compared to 2023.  Patient's digit range of motion about normal for patient's baseline.  Pt remains limited in wrist flexion , UD as well as supination.  But after moist heat patient show increased range of motion.  Reviewed importance of heat at home prior to range of motion Focusing on thumb PA and RA - decrease tightness in webspace -patient was fitted with a [facility] neoprene last visit to wear for functional strengthening during the day with less pain.  Tolerating really well and Reviewed with pt again 1 pound weight for wrist, forearm and elbow and shoulder strengthening.  Patient needed reviewing of exercises again today.  Pt responds well to re instruction of exercises as well as repetition for carry over.  Pt would benefit from skilled OT services to maximize safety and independence with necessary daily tasks.    PERFORMANCE DEFICITS: in functional skills including ADLs, IADLs, coordination, dexterity, sensation, edema, ROM, strength, pain, fascial restrictions, flexibility, Fine motor control, Gross motor control, balance, decreased knowledge of use of DME, skin integrity, and UE functional use, cognitive skills including attention and memory, and psychosocial skills including environmental adaptation, habits, and routines and behaviors.   IMPAIRMENTS: are limiting patient from ADLs, IADLs, and social participation.   COMORBIDITIES: may have co-morbidities  that affects occupational performance. Patient will benefit from skilled OT to address above impairments and improve overall function.  MODIFICATION OR ASSISTANCE TO COMPLETE EVALUATION: Min-Moderate modification of tasks or assist with assess necessary to complete an evaluation.  OT OCCUPATIONAL PROFILE AND HISTORY: Detailed assessment: Review of records and additional review of physical, cognitive, psychosocial history related to current functional performance.  CLINICAL DECISION  MAKING: Moderate - several treatment options, min-mod task modification necessary  REHAB POTENTIAL: Good  EVALUATION COMPLEXITY: Moderate      PLAN:  OT FREQUENCY: 1-2x/week  OT DURATION: 6 weeks  PLANNED INTERVENTIONS: 97168 OT Re-evaluation, 97535 self care/ADL training, 16109 therapeutic exercise, 97530 therapeutic activity, 97112 neuromuscular re-education, 97140 manual therapy, 97035 ultrasound, 97018 paraffin, 60454 fluidotherapy, 97010 moist heat, 97010 cryotherapy, 97034 contrast bath, 97760 Orthotics management and training, 09811 Splinting (initial encounter), scar mobilization, passive range of motion, patient/family education, and DME and/or AE instructions  RECOMMENDED OTHER SERVICES: none currently  CONSULTED AND AGREED WITH PLAN OF CARE: Patient and family member/caregiver  PLAN FOR NEXT SESSION: continue with edema control measures, modify HEP as pt progresses.     Heloise Lobo, OTR/L,CLT 08/29/2023, 12:45 PM

## 2023-09-01 ENCOUNTER — Ambulatory Visit: Admitting: Occupational Therapy

## 2023-09-01 DIAGNOSIS — M25641 Stiffness of right hand, not elsewhere classified: Secondary | ICD-10-CM

## 2023-09-01 DIAGNOSIS — M25531 Pain in right wrist: Secondary | ICD-10-CM | POA: Diagnosis not present

## 2023-09-01 DIAGNOSIS — M79641 Pain in right hand: Secondary | ICD-10-CM

## 2023-09-01 DIAGNOSIS — R600 Localized edema: Secondary | ICD-10-CM

## 2023-09-01 DIAGNOSIS — M6281 Muscle weakness (generalized): Secondary | ICD-10-CM

## 2023-09-01 DIAGNOSIS — M25631 Stiffness of right wrist, not elsewhere classified: Secondary | ICD-10-CM

## 2023-09-01 NOTE — Therapy (Signed)
 OUTPATIENT OCCUPATIONAL THERAPY ORTHO TREATMENT  Patient Name: Teresa Lin MRN: [id number] DOB:[birth year], 89 y.o., female Today's Date: 09/01/2023  PCP: Teresa Battles, MD REFERRING PROVIDER: Rojelio Clement, PA-C  END OF SESSION:  OT End of Session - 09/01/23 1035     Visit Number 7    Number of Visits 13    Date for OT Re-Evaluation 09/05/23    OT Start Time 1035    OT Stop Time 1124    OT Time Calculation (min) 49 min    Activity Tolerance Patient tolerated treatment well    Behavior During Therapy [facility] for tasks assessed/performed             Past Medical History:  Diagnosis Date   Anemia    Anxiety    Arthritis    osteoarthritis. Bilateral knee replacement, hands, meniscal tear, cervical disc disease   Atherosclerosis    Atypical chest pain    Collagen vascular disease (HCC)    Colon polyp 12/15/2017   Colon polyps    Compression fracture of L2 (HCC)    Diverticulosis    DVT (deep venous thrombosis) (HCC)    Fracture of one rib, right side, subsequent encounter for fracture with routine healing 03/08/2017   GERD (gastroesophageal reflux disease)    History of seronegative inflammatory arthritis    HOH (hard of hearing)    no hearing aids   Hypertension    IBS (irritable bowel syndrome)    Internal hemorrhoids    Phlebitis and thrombophlebitis of deep veins of lower extremities (HCC)    after surgery   Vision abnormalities    Past Surgical History:  Procedure Laterality Date   APPENDECTOMY     COLONOSCOPY N/A 01/24/2020   Procedure: COLONOSCOPY;  Surgeon: Shane Darling, MD;  Location: [facility] ENDOSCOPY;  Service: Endoscopy;  Laterality: N/A;   COLONOSCOPY WITH PROPOFOL  N/A 10/24/2017   Procedure: COLONOSCOPY WITH PROPOFOL ;  Surgeon: Cassie Click, MD;  Location: [facility] ENDOSCOPY;  Service: Endoscopy;  Laterality: N/A;   COLONOSCOPY WITH PROPOFOL  N/A 11/21/2017   Procedure: COLONOSCOPY WITH PROPOFOL ;  Surgeon: Cassie Click, MD;  Location: [facility]  ENDOSCOPY;  Service: Endoscopy;  Laterality: N/A;   DILATION AND CURETTAGE OF UTERUS     ESOPHAGOGASTRODUODENOSCOPY (EGD) WITH PROPOFOL  N/A 02/25/2018   Procedure: ESOPHAGOGASTRODUODENOSCOPY (EGD) WITH PROPOFOL ;  Surgeon: Cassie Click, MD;  Location: [facility] ENDOSCOPY;  Service: Endoscopy;  Laterality: N/A;   EYE SURGERY     bilateral catarACT   JOINT REPLACEMENT Bilateral    knee replacement   LAPAROSCOPIC RIGHT COLECTOMY Right 12/15/2017   Procedure: LAPAROSCOPIC RIGHT COLECTOMY;  Surgeon: Marshall Skeeter, MD;  Location: [facility] ORS;  Service: General;  Laterality: Right;   ORIF HUMERUS FRACTURE Right 08/09/2021   Procedure: IRRIGATION AND DEBRIDEMENT; OPEN REDUCTION INTERNAL FIXATION (ORIF) DISTAL HUMERUS FRACTURE;  Surgeon: Hardy Lia, MD;  Location: [facility] OR;  Service: Orthopedics;  Laterality: Right;   REPLACEMENT TOTAL KNEE BILATERAL     TOTAL SHOULDER ARTHROPLASTY Right 09/24/2018   Procedure: TOTAL SHOULDER ARTHROPLASTY - REVERSE RIGHT;  Surgeon: Elner Hahn, MD;  Location: [facility] ORS;  Service: Orthopedics;  Laterality: Right;   Patient Active Problem List   Diagnosis Date Noted   At risk for falling 02/13/2023   Fracture of cervical vertebra, C6 (HCC) 02/13/2023   T1 vertebral fracture (HCC) 02/13/2023   Rib fractures 02/13/2023   Lumbar transverse process fracture (HCC) 02/13/2023   Leukocytosis 02/13/2023   Normocytic anemia 08/11/2021   Hypokalemia  08/09/2021   Displaced comminuted supracondylar fracture of right humerus without intercondylar fracture 08/08/2021   Open fracture of right humerus 08/08/2021   History of DVT (deep vein thrombosis) 06/06/2021   Rheumatoid arthritis of multiple sites without rheumatoid factor (HCC) 04/24/2021   History of pulmonary embolism 02/04/2020   History of blood clotting disorder 01/12/2020   Immunosuppression (HCC) 01/25/2019   Status post reverse total shoulder replacement, right 09/24/2018   Hyperglycemia 08/31/2018   S/P right  colectomy 01/26/2018   Adenomatous polyp of ascending colon 11/24/2017   Generalized anxiety disorder 09/06/2017   Irritable bowel syndrome (IBS) 09/06/2017   Orthostasis 06/05/2016   Left shoulder pain 10/13/2015   Constipation 05/30/2015   Osteoporosis, postmenopausal 05/30/2015   Compression fracture of L2 vertebra with delayed healing 05/29/2015   Frequent falls 03/26/2015   Atherosclerosis of arteries 03/26/2015   Family history of colon cancer 09/28/2014   TMJ (temporomandibular joint syndrome) 06/12/2014   Varicose veins of both lower extremities without ulcer or inflammation 06/12/2014   B12 deficiency 09/20/2013   Fatigue 05/23/2013   Internal hemorrhoids without complication 04/20/2013   Insomnia 03/03/2013   Vertigo due to cerebrovascular disease 03/03/2013   Seronegative rheumatoid arthritis affecting lower leg (HCC) 03/03/2013    ONSET DATE: 06/07/2023  REFERRING DIAG: other fracture lower end right ulna, closed  THERAPY DIAG:  Pain in right wrist  Pain in right hand  Localized edema  Stiffness of right hand, not elsewhere classified  Stiffness of right wrist, not elsewhere classified  Muscle weakness (generalized)  Rationale for Evaluation and Treatment: Rehabilitation  SUBJECTIVE:   SUBJECTIVE STATEMENT: See note  Pt accompanied by:  caregiver, Teresa Lin  PERTINENT HISTORY: Per chart at last MD appt with Dr. Daun Lin, Teresa Lin is a 89 y.o. female who presents today for repeat evaluation status post a right wrist injury sustained on 06/06/2023. [place] landing directly on concrete, subsequent x-rays of the right wrist demonstrate evidence of a minimally displaced right distal ulna fracture. The patient was initially valued by me where she was placed into a short arm cast, she was last evaluated on 06/18/2023, she was instructed to follow-up with me in 1 week for repeat x-rays to ensure no shifting of the fracture was  identified. The patient however got sick and did not follow-up until today. The patient does still report some underlying discomfort in the right wrist, she does have a history of significant right wrist osteoarthritic changes. She is followed by rheumatology for Seronegative inflammatory arthritis. The patient has undergone infusions in the past for this condition. She denies any repeat trauma or injury for the right wrist. She denies any irritation from the short arm cast at today's visit.   PRECAUTIONS: None    WEIGHT BEARING RESTRICTIONS: No  PAIN:  Are you having pain?  7-9/10 pain in wrist and digits with certain motions FALLS: Has patient fallen in last 6 months? Yes. Number of falls unsure  LIVING ENVIRONMENT: Lives with: Lives alone Lives in: House/apartment Stairs: No    PLOF: Independent  PATIENT GOALS: Pt would like to decrease pain, increase ROM and be able to use hand functionally for daily tasks.    NEXT MD VISIT: unsure of next MD appt date  OBJECTIVE:  Note: Objective measures were completed at Evaluation unless otherwise noted.  HAND DOMINANCE: Right  ADLs: Overall ADLs: Pt reports she has difficulty and requires assistance with cutting food, pulling up her pants,  performing housework, managing buttons, gripping and opening containers, flossing her teeth and brushing her teeth.  She was living independently prior to fall but now has a caregiver to assist her 5 days a week from 10-3 daily.  She is not able to drive currently.     FUNCTIONAL OUTCOME MEASURES: To be assessed next session  UPPER EXTREMITY ROM:     Active ROM Right eval Left eval R 08/18/23 R 08/29/23  Shoulder flexion 90 90    Shoulder abduction 90 90    Shoulder adduction      Shoulder extension      Shoulder internal rotation      Shoulder external rotation      Elbow flexion 135 140    Elbow extension 0 0    Wrist flexion 47  35 30 after heat 44  Wrist extension 0  20 30  Wrist ulnar  deviation    20 after heat 30  Wrist radial deviation    0  Wrist pronation 70  90   Wrist supination 50  75   (Blank rows = not tested)  Active ROM Right eval Left eval R 08/21/23  Thumb MCP (0-60) 45    Thumb IP (0-80) 35    Thumb Radial abd/add (0-55) 20   30  Thumb Palmar abd/add (0-45) 22   40  Thumb Opposition to Small Finger To small finger with pain Opposition to 5th pain in thumb    Index MCP (0-90) 65  80 75  Index PIP (0-100) 70  75 80  Index DIP (0-70)       Long MCP (0-90)  90  90 90  Long PIP (0-100)  65  55 60  Long DIP (0-70)       Ring MCP (0-90)  90  90 95  Ring PIP (0-100)  90  90 95  Ring DIP (0-70)       Little MCP (0-90)  95  95 95  Little PIP (0-100)  90  90 95  Little DIP (0-70)       (Blank rows = not tested)  HAND FUNCTION: Eval: Grip strength: Right: 3 lbs; Left: 38 lbs, Lateral pinch: Right:   lbs, Left:   lbs, and 3 point pinch: Right:   lbs, Left:   lbs 08/01/23: Grip strength: Right: 10 lbs; Left: 38 lbs, Lateral pinch: Right:   lbs, Left:   lbs, and 3 point pinch: Right:   lbs, Left:   lbs 08/18/23: Grip strength: Right: 19 lbs; Left: 38 lbs, Lateral pinch: Right:   8lbs, Left:   lbs, and 3 point pinch: Right:  4 lbs, Left:   lbs 08/26/23: Grip strength: Right: 21 lbs; Left: 38 lbs, Lateral pinch: Right:   9lbs, Left:   lbs, and 3 point pinch: Right:  6 lbs, Left:   lbs 09/01/23: Grip strength: Right: 20 lbs; Left: 38 lbs, Lateral pinch: Right:   9lbs, Left:   lbs, and 3 point pinch: Right:  6 lbs, Left:   lbs  COORDINATION: Impaired coordination with decreased functional use of right hand and wrist   SENSATION: Eval Increased pain in wrist and hand, will continue to assess  EDEMA:  eval edema present in right hand and wrist   COGNITION: Overall cognitive status:  Pt demonstrates difficulty at times recalling new learning and information, benefits from repetition Areas of impairment: Memory: Deficits short term memory  TREATMENT DATE:  09/01/2023    Patient arrived with not feeling  as good as usual.  Patient took her methotrexate  yesterday.  Ask to check her blood pressure.  Feel like it is running on the low side. At start of care blood pressure was 110/60. End of session blood pressure was 120/70. Appear with the warmer weather patient did not use her air conditioning yet. Discussed with patient also making sure she eats getting enough protein and nutrition.  And put her air on on the heart days. Patient do have appointment coming up with cardiology as well as rheumatology in the next week or 2  Appear patient was more sedentary over the weekend.  Decreased wrist extension more than flexion.  As well as noticed decreased wrist extension strength during grip assessment and pinches.  Reviewed with patient again importance of using her CMC neoprene during the day as well as with exercises Focusing on doing moist heat and maintaining her wrist flexion extension range of motion. Reviewed with her and caregiver active assisted range of motion for right wrist flexion extension This date did add asymmetric strengthening for wrist extension at neutral with CMC 10 reps to 3 times a day Patient able to tolerate that pain-free.       review home exercises with 1 pound weight and yellow Thera-Band. For wrist and elbow extension Needed min assist to perform correctly Patient was still doing it with a water bottle. Reason for importance of using her 1 pound weight for her thumb.   Continue once or twice a day 1 pound weight with CMC neoprene radial ulnar deviation, supination and pronation, elbow flexion extension, scapular squeezes or rowing.  And forward punches.  12 reps 1 time a day.  Tolerating well. Reviewed with patient gripping and performing home exercises without irritating her rheumatoid arthritis as well as her pain in wrist, digits and thumb. Remind patient again that she can wear during the day the [facility] neoprene with her  home exercises as well as functional use to decrease pain in the thumb and wrist while getting some functional strengthening and grip and prehension.   (Genesis see flowsheet. Cont to compare Measurements to when patient was seen in 23. Patient has rheumatoid arthritis and her baseline is impaired   Reviewed with patient and caregiver to do contrast if increased inflammation.  Otherwise if more stiffness and pain can do heat prior to motion Show for patient and reinforced importance of moist heat prior to home exercises to continue to improve and maintain her range of motion in the wrist and fingers.                                                                                                                Modalities: Fluidotherapy time: 8 Location: right hand and wrist To decrease pain, increase tissue mobility and increase ROM  Patient show increase ulnar deviation and wrist flexion after  fluido     Therapeutic Exercise:  Patient can continue with tendon glides.  But mostly MC flexion and composite flexion pain-free gentle motion more than forcing it.  But focusing on thumb palmar radial abduction. Done some soft tissue massage to the right thumb webspace with metacarpal sprites and reviewed with caretaker again prior to palmar radial abduction of the thumb Reviewed opposition to all digits with picking up 2 cm object to decrease pain. 10 reps  Reviewed with patient and caretaker again active assisted range of motion for wrist flexion extension as well as radial ulnar deviation stop when feeling pain.  10 reps    PATIENT EDUCATION: Education details: use of contrast for edema control, ROM exercises Person educated: Patient and Nurse, learning disability Education method: Explanation, Demonstration, Tactile cues, Verbal cues, and Handouts Education comprehension: verbalized understanding, verbal cues required, and needs further education  HOME EXERCISE PROGRAM: See above for  details   GOALS: Goals reviewed with patient? Yes  SHORT TERM GOALS: Target date: 08/12/2023  Pt will demonstrate the understanding of use of contrast for edema control of right wrist and hand. Baseline: minimal knowledge of contrast at eval  Goal status: INITIAL  2.  Pt will demonstrate oral hygiene with modified independence with use of built up handles on her toothbrush. Baseline: difficulty at eval  Goal status: INITIAL   LONG TERM GOALS: Target date: 09/05/2023  Pt will complete home exercise program with modified independence. Baseline: no current program Goal status: INITIAL  2.  Pt will improve grip strength by 10 pounds to manage opening containers with modified independence. Baseline: difficulty at eval  Goal status: INITIAL  3.  Pt will improve strength of RUE to pull up pants with modified independence. Baseline: difficulty at eval Goal status: INITIAL  4.  Pt will improve hand function to be able to cut food with modified independence. Baseline: unable to cut food at eval  Goal status: INITIAL  5.  Pt will demonstrate ability to perform buttons with modified independence. Baseline: difficulty with buttons at eval Goal status: INITIAL  6.  Pt will improve ROM to [facility] to manage opening doors. Baseline: limited ROM at eval, difficulty managing doors. Goal status: INITIAL  ASSESSMENT:  CLINICAL IMPRESSION: Patient seen for occupational therapy for right closed fracture of the lower end of the right ulna. Pt with a history of arthritis which continues to affect her hand function.  Patient was seen by this OT in 2023.  Patient is range of motion and strength in right wrist and hand was assessed and compared to 2023.  Patient's digit range of motion about normal for patient's baseline.  Pt remains limited in wrist flexion , UD as well as supination.  But after moist heat patient show increased range of motion.  Reviewed again importance of heat at home prior to  range of motion Focusing on thumb PA and RA - decrease tightness in webspace -patient was fitted with a CMC neoprene in the past to wear for functional strengthening during the day with less pain.  Reviewed with patient again moist heat focusing and maintaining her wrist radial ulnar deviation and wrist extension.  And importance of getting 1 pound weight for wrist, forearm and elbow and shoulder strengthening.  At this date asymmetric strengthening for wrist extension in neutral with or without CMC neoprene as long as pain-free.  Patient needed reviewing of exercises again today.  Pt responds well to re instruction of exercises as well as repetition for carry over.  Pt would benefit from skilled OT services to maximize safety and independence with necessary daily tasks.    PERFORMANCE DEFICITS: in functional skills including ADLs, IADLs, coordination,  dexterity, sensation, edema, ROM, strength, pain, fascial restrictions, flexibility, Fine motor control, Gross motor control, balance, decreased knowledge of use of DME, skin integrity, and UE functional use, cognitive skills including attention and memory, and psychosocial skills including environmental adaptation, habits, and routines and behaviors.   IMPAIRMENTS: are limiting patient from ADLs, IADLs, and social participation.   COMORBIDITIES: may have co-morbidities  that affects occupational performance. Patient will benefit from skilled OT to address above impairments and improve overall function.  MODIFICATION OR ASSISTANCE TO COMPLETE EVALUATION: Min-Moderate modification of tasks or assist with assess necessary to complete an evaluation.  OT OCCUPATIONAL PROFILE AND HISTORY: Detailed assessment: Review of records and additional review of physical, cognitive, psychosocial history related to current functional performance.  CLINICAL DECISION MAKING: Moderate - several treatment options, min-mod task modification necessary  REHAB POTENTIAL:  Good  EVALUATION COMPLEXITY: Moderate      PLAN:  OT FREQUENCY: 1-2x/week  OT DURATION: 6 weeks  PLANNED INTERVENTIONS: 97168 OT Re-evaluation, 97535 self care/ADL training, 16109 therapeutic exercise, 97530 therapeutic activity, 97112 neuromuscular re-education, 97140 manual therapy, 97035 ultrasound, 97018 paraffin, 60454 fluidotherapy, 97010 moist heat, 97010 cryotherapy, 97034 contrast bath, 97760 Orthotics management and training, 09811 Splinting (initial encounter), scar mobilization, passive range of motion, patient/family education, and DME and/or AE instructions  RECOMMENDED OTHER SERVICES: none currently  CONSULTED AND AGREED WITH PLAN OF CARE: Patient and family member/caregiver  PLAN FOR NEXT SESSION: continue with edema control measures, modify HEP as pt progresses.     Heloise Lobo, OTR/L,CLT 09/01/2023, 12:54 PM

## 2023-09-04 ENCOUNTER — Ambulatory Visit: Admitting: Occupational Therapy

## 2023-09-04 DIAGNOSIS — M79641 Pain in right hand: Secondary | ICD-10-CM

## 2023-09-04 DIAGNOSIS — R600 Localized edema: Secondary | ICD-10-CM

## 2023-09-04 DIAGNOSIS — M6281 Muscle weakness (generalized): Secondary | ICD-10-CM

## 2023-09-04 DIAGNOSIS — M25531 Pain in right wrist: Secondary | ICD-10-CM | POA: Diagnosis not present

## 2023-09-04 DIAGNOSIS — M25641 Stiffness of right hand, not elsewhere classified: Secondary | ICD-10-CM

## 2023-09-04 DIAGNOSIS — M25631 Stiffness of right wrist, not elsewhere classified: Secondary | ICD-10-CM

## 2023-09-04 NOTE — Therapy (Signed)
 OUTPATIENT OCCUPATIONAL THERAPY ORTHO TREATMENT  Patient Name: RANDELL DETTER MRN: [id number] DOB:[birth year], 89 y.o., female Today's Date: 09/04/2023  PCP: Antonietta Battles, MD REFERRING PROVIDER: Rojelio Clement, PA-C  END OF SESSION:  OT End of Session - 09/04/23 1732     Visit Number 8    Number of Visits 13    Date for OT Re-Evaluation 09/05/23    OT Start Time 1035    OT Stop Time 1115    OT Time Calculation (min) 40 min    Activity Tolerance Patient tolerated treatment well    Behavior During Therapy [facility] for tasks assessed/performed             Past Medical History:  Diagnosis Date   Anemia    Anxiety    Arthritis    osteoarthritis. Bilateral knee replacement, hands, meniscal tear, cervical disc disease   Atherosclerosis    Atypical chest pain    Collagen vascular disease (HCC)    Colon polyp 12/15/2017   Colon polyps    Compression fracture of L2 (HCC)    Diverticulosis    DVT (deep venous thrombosis) (HCC)    Fracture of one rib, right side, subsequent encounter for fracture with routine healing 03/08/2017   GERD (gastroesophageal reflux disease)    History of seronegative inflammatory arthritis    HOH (hard of hearing)    no hearing aids   Hypertension    IBS (irritable bowel syndrome)    Internal hemorrhoids    Phlebitis and thrombophlebitis of deep veins of lower extremities (HCC)    after surgery   Vision abnormalities    Past Surgical History:  Procedure Laterality Date   APPENDECTOMY     COLONOSCOPY N/A 01/24/2020   Procedure: COLONOSCOPY;  Surgeon: Shane Darling, MD;  Location: [facility] ENDOSCOPY;  Service: Endoscopy;  Laterality: N/A;   COLONOSCOPY WITH PROPOFOL  N/A 10/24/2017   Procedure: COLONOSCOPY WITH PROPOFOL ;  Surgeon: Cassie Click, MD;  Location: [facility] ENDOSCOPY;  Service: Endoscopy;  Laterality: N/A;   COLONOSCOPY WITH PROPOFOL  N/A 11/21/2017   Procedure: COLONOSCOPY WITH PROPOFOL ;  Surgeon: Cassie Click, MD;  Location: [facility]  ENDOSCOPY;  Service: Endoscopy;  Laterality: N/A;   DILATION AND CURETTAGE OF UTERUS     ESOPHAGOGASTRODUODENOSCOPY (EGD) WITH PROPOFOL  N/A 02/25/2018   Procedure: ESOPHAGOGASTRODUODENOSCOPY (EGD) WITH PROPOFOL ;  Surgeon: Cassie Click, MD;  Location: [facility] ENDOSCOPY;  Service: Endoscopy;  Laterality: N/A;   EYE SURGERY     bilateral catarACT   JOINT REPLACEMENT Bilateral    knee replacement   LAPAROSCOPIC RIGHT COLECTOMY Right 12/15/2017   Procedure: LAPAROSCOPIC RIGHT COLECTOMY;  Surgeon: Marshall Skeeter, MD;  Location: [facility] ORS;  Service: General;  Laterality: Right;   ORIF HUMERUS FRACTURE Right 08/09/2021   Procedure: IRRIGATION AND DEBRIDEMENT; OPEN REDUCTION INTERNAL FIXATION (ORIF) DISTAL HUMERUS FRACTURE;  Surgeon: Hardy Lia, MD;  Location: [facility] OR;  Service: Orthopedics;  Laterality: Right;   REPLACEMENT TOTAL KNEE BILATERAL     TOTAL SHOULDER ARTHROPLASTY Right 09/24/2018   Procedure: TOTAL SHOULDER ARTHROPLASTY - REVERSE RIGHT;  Surgeon: Elner Hahn, MD;  Location: [facility] ORS;  Service: Orthopedics;  Laterality: Right;   Patient Active Problem List   Diagnosis Date Noted   At risk for falling 02/13/2023   Fracture of cervical vertebra, C6 (HCC) 02/13/2023   T1 vertebral fracture (HCC) 02/13/2023   Rib fractures 02/13/2023   Lumbar transverse process fracture (HCC) 02/13/2023   Leukocytosis 02/13/2023   Normocytic anemia 08/11/2021   Hypokalemia  08/09/2021   Displaced comminuted supracondylar fracture of right humerus without intercondylar fracture 08/08/2021   Open fracture of right humerus 08/08/2021   History of DVT (deep vein thrombosis) 06/06/2021   Rheumatoid arthritis of multiple sites without rheumatoid factor (HCC) 04/24/2021   History of pulmonary embolism 02/04/2020   History of blood clotting disorder 01/12/2020   Immunosuppression (HCC) 01/25/2019   Status post reverse total shoulder replacement, right 09/24/2018   Hyperglycemia 08/31/2018   S/P right  colectomy 01/26/2018   Adenomatous polyp of ascending colon 11/24/2017   Generalized anxiety disorder 09/06/2017   Irritable bowel syndrome (IBS) 09/06/2017   Orthostasis 06/05/2016   Left shoulder pain 10/13/2015   Constipation 05/30/2015   Osteoporosis, postmenopausal 05/30/2015   Compression fracture of L2 vertebra with delayed healing 05/29/2015   Frequent falls 03/26/2015   Atherosclerosis of arteries 03/26/2015   Family history of colon cancer 09/28/2014   TMJ (temporomandibular joint syndrome) 06/12/2014   Varicose veins of both lower extremities without ulcer or inflammation 06/12/2014   B12 deficiency 09/20/2013   Fatigue 05/23/2013   Internal hemorrhoids without complication 04/20/2013   Insomnia 03/03/2013   Vertigo due to cerebrovascular disease 03/03/2013   Seronegative rheumatoid arthritis affecting lower leg (HCC) 03/03/2013    ONSET DATE: 06/07/2023  REFERRING DIAG: other fracture lower end right ulna, closed  THERAPY DIAG:  Pain in right wrist  Pain in right hand  Localized edema  Stiffness of right hand, not elsewhere classified  Stiffness of right wrist, not elsewhere classified  Muscle weakness (generalized)  Rationale for Evaluation and Treatment: Rehabilitation  SUBJECTIVE:   SUBJECTIVE STATEMENT: See note  Pt accompanied by:  caregiver, Betty Bruckner  PERTINENT HISTORY: Per chart at last MD appt with Dr. Daun Epstein, Beonka Amesquita is a 89 y.o. female who presents today for repeat evaluation status post a right wrist injury sustained on 06/06/2023. [place] landing directly on concrete, subsequent x-rays of the right wrist demonstrate evidence of a minimally displaced right distal ulna fracture. The patient was initially valued by me where she was placed into a short arm cast, she was last evaluated on 06/18/2023, she was instructed to follow-up with me in 1 week for repeat x-rays to ensure no shifting of the fracture was  identified. The patient however got sick and did not follow-up until today. The patient does still report some underlying discomfort in the right wrist, she does have a history of significant right wrist osteoarthritic changes. She is followed by rheumatology for Seronegative inflammatory arthritis. The patient has undergone infusions in the past for this condition. She denies any repeat trauma or injury for the right wrist. She denies any irritation from the short arm cast at today's visit.   PRECAUTIONS: None    WEIGHT BEARING RESTRICTIONS: No  PAIN:  Are you having pain?  7-9/10 pain in wrist and digits with certain motions FALLS: Has patient fallen in last 6 months? Yes. Number of falls unsure  LIVING ENVIRONMENT: Lives with: Lives alone Lives in: House/apartment Stairs: No    PLOF: Independent  PATIENT GOALS: Pt would like to decrease pain, increase ROM and be able to use hand functionally for daily tasks.    NEXT MD VISIT: unsure of next MD appt date  OBJECTIVE:  Note: Objective measures were completed at Evaluation unless otherwise noted.  HAND DOMINANCE: Right  ADLs: Overall ADLs: Pt reports she has difficulty and requires assistance with cutting food, pulling up her pants,  performing housework, managing buttons, gripping and opening containers, flossing her teeth and brushing her teeth.  She was living independently prior to fall but now has a caregiver to assist her 5 days a week from 10-3 daily.  She is not able to drive currently.     FUNCTIONAL OUTCOME MEASURES: To be assessed next session  UPPER EXTREMITY ROM:     Active ROM Right eval Left eval R 08/18/23 R 08/29/23 R 09/04/23  Shoulder flexion 90 90     Shoulder abduction 90 90     Shoulder adduction       Shoulder extension       Shoulder internal rotation       Shoulder external rotation       Elbow flexion 135 140     Elbow extension 0 0     Wrist flexion 47  35 30 after heat 44 20 after PROM and heat  30  Wrist extension 0  20 30 40  Wrist ulnar deviation    20 after heat 30 30  Wrist radial deviation    0 0  Wrist pronation 70  90    Wrist supination 50  75    (Blank rows = not tested)  Active ROM Right eval Left eval R 08/21/23  Thumb MCP (0-60) 45    Thumb IP (0-80) 35    Thumb Radial abd/add (0-55) 20   30  Thumb Palmar abd/add (0-45) 22   40  Thumb Opposition to Small Finger To small finger with pain Opposition to 5th pain in thumb    Index MCP (0-90) 65  80 75  Index PIP (0-100) 70  75 80  Index DIP (0-70)       Long MCP (0-90)  90  90 90  Long PIP (0-100)  65  55 60  Long DIP (0-70)       Ring MCP (0-90)  90  90 95  Ring PIP (0-100)  90  90 95  Ring DIP (0-70)       Little MCP (0-90)  95  95 95  Little PIP (0-100)  90  90 95  Little DIP (0-70)       (Blank rows = not tested)  HAND FUNCTION: Eval: Grip strength: Right: 3 lbs; Left: 38 lbs, Lateral pinch: Right:   lbs, Left:   lbs, and 3 point pinch: Right:   lbs, Left:   lbs 08/01/23: Grip strength: Right: 10 lbs; Left: 38 lbs, Lateral pinch: Right:   lbs, Left:   lbs, and 3 point pinch: Right:   lbs, Left:   lbs 08/18/23: Grip strength: Right: 19 lbs; Left: 38 lbs, Lateral pinch: Right:   8lbs, Left:   lbs, and 3 point pinch: Right:  4 lbs, Left:   lbs 08/26/23: Grip strength: Right: 21 lbs; Left: 38 lbs, Lateral pinch: Right:   9lbs, Left:   lbs, and 3 point pinch: Right:  6 lbs, Left:   lbs 09/01/23: Grip strength: Right: 20 lbs; Left: 38 lbs, Lateral pinch: Right:   9lbs, Left:   lbs, and 3 point pinch: Right:  6 lbs, Left:   lbs  COORDINATION: Impaired coordination with decreased functional use of right hand and wrist   SENSATION: Eval Increased pain in wrist and hand, will continue to assess  EDEMA:  eval edema present in right hand and wrist   COGNITION: Overall cognitive status:  Pt demonstrates difficulty at times recalling new learning and information, benefits from repetition  Areas of impairment:  Memory: Deficits short term memory  TREATMENT DATE: 09/04/2023    Patient arrive with reports of increased difficulty with spoon and fork feeding herself. Reviewed with patient cutting food as well as using a fork or spoon. Provided her within large grips and was much easier and pain-free. As well as less pain with CMC neoprene. Reviewed with patient some adaptive equipment and resources to increase independence and ease on her hands.  Like a larger nail clipper, pain again, pruning scissors, utensils within large grips, buttenhook  Reviewed with patient again importance of using her CMC neoprene during the day as well as with exercises Focusing on doing moist heat and maintaining her wrist flexion extension range of motion. Reviewed with her and caregiver active assisted range of motion for right wrist flexion extension Done better with caregiving provided her extended wrist extension stretch.  With great improvement followed by isometric strengthening for wrist extension at neutral with CMC 10 reps to 3 times a day Patient able to tolerate that pain-free.     Commended the prolonged extension stretch gentle and isometric to be done by caretaker  Continue once or twice a day 1 pound weight with CMC neoprene radial ulnar deviation, supination and pronation, elbow flexion extension, scapular squeezes or rowing.  And forward punches.  12 reps 1 time a day.  Tolerating well. Reviewed with patient gripping and performing home exercises without irritating her rheumatoid arthritis as well as her pain in wrist, digits and thumb. Remind patient again that she can wear during the day the [facility] neoprene with her home exercises as well as functional use to decrease pain in the thumb and wrist while getting some functional strengthening and grip and prehension.   see flowsheet. Cont to compare Measurements to when patient was seen in 23. Patient has rheumatoid arthritis and her baseline is  impaired   Reviewed with patient and caregiver to do contrast if increased inflammation.  Otherwise if more stiffness and pain can do heat prior to motion-and the importance to do it in the morning Show for patient and reinforced importance of moist heat prior to home exercises to continue to improve and maintain her range of motion in the wrist and fingers.                                                                                                                Modalities: Fluidotherapy time: 8 Location: right hand and wrist To decrease pain, increase tissue mobility and increase ROM  Patient show increase ulnar deviation and wrist flexion after  fluido     Therapeutic Exercise:  Patient can continue with tendon glides.  But mostly MC flexion and composite flexion pain-free gentle motion more than forcing it.   But focusing on thumb palmar radial abduction.  Reviewed opposition to all digits with picking up 2 cm object to decrease pain. 10 reps     PATIENT EDUCATION: Education details: use of contrast for edema control, ROM exercises Person educated: Patient and Caregiver  Betty Bruckner Education method: Explanation, Demonstration, Tactile cues, Verbal cues, and Handouts Education comprehension: verbalized understanding, verbal cues required, and needs further education  HOME EXERCISE PROGRAM: See above for details   GOALS: Goals reviewed with patient? Yes  SHORT TERM GOALS: Target date: 08/12/2023  Pt will demonstrate the understanding of use of contrast for edema control of right wrist and hand. Baseline: minimal knowledge of contrast at eval  Goal status: INITIAL  2.  Pt will demonstrate oral hygiene with modified independence with use of built up handles on her toothbrush. Baseline: difficulty at eval  Goal status: INITIAL   LONG TERM GOALS: Target date: 09/05/2023  Pt will complete home exercise program with modified independence. Baseline: no current  program Goal status: INITIAL  2.  Pt will improve grip strength by 10 pounds to manage opening containers with modified independence. Baseline: difficulty at eval  Goal status: INITIAL  3.  Pt will improve strength of RUE to pull up pants with modified independence. Baseline: difficulty at eval Goal status: INITIAL  4.  Pt will improve hand function to be able to cut food with modified independence. Baseline: unable to cut food at eval  Goal status: INITIAL  5.  Pt will demonstrate ability to perform buttons with modified independence. Baseline: difficulty with buttons at eval Goal status: INITIAL  6.  Pt will improve ROM to [facility] to manage opening doors. Baseline: limited ROM at eval, difficulty managing doors. Goal status: INITIAL  ASSESSMENT:  CLINICAL IMPRESSION: Patient seen for occupational therapy for right closed fracture of the lower end of the right ulna. Pt with a history of arthritis which continues to affect her hand function.  Patient was seen by this OT in 2023.  Patient is range of motion and strength in right wrist and hand was assessed and compared to 2023.  Patient's digit range of motion about normal for patient's baseline.  Patient also showing great progress to her baseline for supination pronation radial ulnar deviation and wrist flexion today.  Wrist extension still decreased making her ability to maintain wrist extension with grip harder.  Patient and caretaker to focus on those aspects over the next few days.  Patient to continue to wear CMC neoprene  for functional strengthening during the day with less pain.  Reviewed with patient again moist heat or contrast in the morning focusing and maintaining her wrist radial ulnar deviation and wrist extension.   1 pound weight for wrist, forearm and elbow and shoulder strengthening.  At this date prolonged wrist extension stretch with isometric strengthening by care taker  for wrist extension with CMC neoprene as long as  pain-free.  Patient needed reviewing of exercises again today.  Pt responds well to re instruction of exercises as well as repetition for carry over.  Pt would benefit from skilled OT services to maximize safety and independence with necessary daily tasks.    PERFORMANCE DEFICITS: in functional skills including ADLs, IADLs, coordination, dexterity, sensation, edema, ROM, strength, pain, fascial restrictions, flexibility, Fine motor control, Gross motor control, balance, decreased knowledge of use of DME, skin integrity, and UE functional use, cognitive skills including attention and memory, and psychosocial skills including environmental adaptation, habits, and routines and behaviors.   IMPAIRMENTS: are limiting patient from ADLs, IADLs, and social participation.   COMORBIDITIES: may have co-morbidities  that affects occupational performance. Patient will benefit from skilled OT to address above impairments and improve overall function.  MODIFICATION OR ASSISTANCE TO COMPLETE EVALUATION: Min-Moderate modification of tasks or  assist with assess necessary to complete an evaluation.  OT OCCUPATIONAL PROFILE AND HISTORY: Detailed assessment: Review of records and additional review of physical, cognitive, psychosocial history related to current functional performance.  CLINICAL DECISION MAKING: Moderate - several treatment options, min-mod task modification necessary  REHAB POTENTIAL: Good  EVALUATION COMPLEXITY: Moderate      PLAN:  OT FREQUENCY: 1-2x/week  OT DURATION: 6 weeks  PLANNED INTERVENTIONS: 97168 OT Re-evaluation, 97535 self care/ADL training, 16109 therapeutic exercise, 97530 therapeutic activity, 97112 neuromuscular re-education, 97140 manual therapy, 97035 ultrasound, 97018 paraffin, 60454 fluidotherapy, 97010 moist heat, 97010 cryotherapy, 97034 contrast bath, 97760 Orthotics management and training, 09811 Splinting (initial encounter), scar mobilization, passive range of  motion, patient/family education, and DME and/or AE instructions  RECOMMENDED OTHER SERVICES: none currently  CONSULTED AND AGREED WITH PLAN OF CARE: Patient and family member/caregiver  PLAN FOR NEXT SESSION: continue with edema control measures, modify HEP as pt progresses.     Heloise Lobo, OTR/L,CLT 09/04/2023, 5:34 PM

## 2023-09-08 ENCOUNTER — Ambulatory Visit: Admitting: Occupational Therapy

## 2023-09-19 ENCOUNTER — Ambulatory Visit: Attending: Student | Admitting: Occupational Therapy

## 2023-09-19 DIAGNOSIS — M25641 Stiffness of right hand, not elsewhere classified: Secondary | ICD-10-CM | POA: Diagnosis present

## 2023-09-19 DIAGNOSIS — M79641 Pain in right hand: Secondary | ICD-10-CM | POA: Diagnosis present

## 2023-09-19 DIAGNOSIS — M6281 Muscle weakness (generalized): Secondary | ICD-10-CM | POA: Diagnosis present

## 2023-09-19 DIAGNOSIS — M25531 Pain in right wrist: Secondary | ICD-10-CM | POA: Insufficient documentation

## 2023-09-19 DIAGNOSIS — R600 Localized edema: Secondary | ICD-10-CM | POA: Diagnosis present

## 2023-09-19 DIAGNOSIS — M25631 Stiffness of right wrist, not elsewhere classified: Secondary | ICD-10-CM | POA: Insufficient documentation

## 2023-09-19 NOTE — Therapy (Signed)
 OUTPATIENT OCCUPATIONAL THERAPY ORTHO TREATMENT/RECERT  Patient Name: Teresa Lin MRN: [id number] DOB:[birth year], 89 y.o., female Today's Date: 09/19/2023  PCP: Antonietta Battles, MD REFERRING PROVIDER: Rojelio Clement, PA-C  END OF SESSION:  OT End of Session - 09/19/23 1450     Visit Number 9    Number of Visits 13    Date for OT Re-Evaluation 11/14/23    OT Start Time 0907    OT Stop Time 0946    OT Time Calculation (min) 39 min    Activity Tolerance Patient tolerated treatment well    Behavior During Therapy [facility] for tasks assessed/performed             Past Medical History:  Diagnosis Date   Anemia    Anxiety    Arthritis    osteoarthritis. Bilateral knee replacement, hands, meniscal tear, cervical disc disease   Atherosclerosis    Atypical chest pain    Collagen vascular disease (HCC)    Colon polyp 12/15/2017   Colon polyps    Compression fracture of L2 (HCC)    Diverticulosis    DVT (deep venous thrombosis) (HCC)    Fracture of one rib, right side, subsequent encounter for fracture with routine healing 03/08/2017   GERD (gastroesophageal reflux disease)    History of seronegative inflammatory arthritis    HOH (hard of hearing)    no hearing aids   Hypertension    IBS (irritable bowel syndrome)    Internal hemorrhoids    Phlebitis and thrombophlebitis of deep veins of lower extremities (HCC)    after surgery   Vision abnormalities    Past Surgical History:  Procedure Laterality Date   APPENDECTOMY     COLONOSCOPY N/A 01/24/2020   Procedure: COLONOSCOPY;  Surgeon: Shane Darling, MD;  Location: [facility] ENDOSCOPY;  Service: Endoscopy;  Laterality: N/A;   COLONOSCOPY WITH PROPOFOL  N/A 10/24/2017   Procedure: COLONOSCOPY WITH PROPOFOL ;  Surgeon: Cassie Click, MD;  Location: [facility] ENDOSCOPY;  Service: Endoscopy;  Laterality: N/A;   COLONOSCOPY WITH PROPOFOL  N/A 11/21/2017   Procedure: COLONOSCOPY WITH PROPOFOL ;  Surgeon: Cassie Click, MD;   Location: [facility] ENDOSCOPY;  Service: Endoscopy;  Laterality: N/A;   DILATION AND CURETTAGE OF UTERUS     ESOPHAGOGASTRODUODENOSCOPY (EGD) WITH PROPOFOL  N/A 02/25/2018   Procedure: ESOPHAGOGASTRODUODENOSCOPY (EGD) WITH PROPOFOL ;  Surgeon: Cassie Click, MD;  Location: [facility] ENDOSCOPY;  Service: Endoscopy;  Laterality: N/A;   EYE SURGERY     bilateral catarACT   JOINT REPLACEMENT Bilateral    knee replacement   LAPAROSCOPIC RIGHT COLECTOMY Right 12/15/2017   Procedure: LAPAROSCOPIC RIGHT COLECTOMY;  Surgeon: Marshall Skeeter, MD;  Location: [facility] ORS;  Service: General;  Laterality: Right;   ORIF HUMERUS FRACTURE Right 08/09/2021   Procedure: IRRIGATION AND DEBRIDEMENT; OPEN REDUCTION INTERNAL FIXATION (ORIF) DISTAL HUMERUS FRACTURE;  Surgeon: Hardy Lia, MD;  Location: [facility] OR;  Service: Orthopedics;  Laterality: Right;   REPLACEMENT TOTAL KNEE BILATERAL     TOTAL SHOULDER ARTHROPLASTY Right 09/24/2018   Procedure: TOTAL SHOULDER ARTHROPLASTY - REVERSE RIGHT;  Surgeon: Elner Hahn, MD;  Location: [facility] ORS;  Service: Orthopedics;  Laterality: Right;   Patient Active Problem List   Diagnosis Date Noted   At risk for falling 02/13/2023   Fracture of cervical vertebra, C6 (HCC) 02/13/2023   T1 vertebral fracture (HCC) 02/13/2023   Rib fractures 02/13/2023   Lumbar transverse process fracture (HCC) 02/13/2023   Leukocytosis 02/13/2023   Normocytic anemia 08/11/2021   Hypokalemia  08/09/2021   Displaced comminuted supracondylar fracture of right humerus without intercondylar fracture 08/08/2021   Open fracture of right humerus 08/08/2021   History of DVT (deep vein thrombosis) 06/06/2021   Rheumatoid arthritis of multiple sites without rheumatoid factor (HCC) 04/24/2021   History of pulmonary embolism 02/04/2020   History of blood clotting disorder 01/12/2020   Immunosuppression (HCC) 01/25/2019   Status post reverse total shoulder replacement, right 09/24/2018   Hyperglycemia 08/31/2018    S/P right colectomy 01/26/2018   Adenomatous polyp of ascending colon 11/24/2017   Generalized anxiety disorder 09/06/2017   Irritable bowel syndrome (IBS) 09/06/2017   Orthostasis 06/05/2016   Left shoulder pain 10/13/2015   Constipation 05/30/2015   Osteoporosis, postmenopausal 05/30/2015   Compression fracture of L2 vertebra with delayed healing 05/29/2015   Frequent falls 03/26/2015   Atherosclerosis of arteries 03/26/2015   Family history of colon cancer 09/28/2014   TMJ (temporomandibular joint syndrome) 06/12/2014   Varicose veins of both lower extremities without ulcer or inflammation 06/12/2014   B12 deficiency 09/20/2013   Fatigue 05/23/2013   Internal hemorrhoids without complication 04/20/2013   Insomnia 03/03/2013   Vertigo due to cerebrovascular disease 03/03/2013   Seronegative rheumatoid arthritis affecting lower leg (HCC) 03/03/2013    ONSET DATE: 06/07/2023  REFERRING DIAG: other fracture lower end right ulna, closed  THERAPY DIAG:  Pain in right wrist  Pain in right hand  Stiffness of right wrist, not elsewhere classified  Muscle weakness (generalized)  Rationale for Evaluation and Treatment: Rehabilitation  SUBJECTIVE:   SUBJECTIVE STATEMENT: See note  Pt accompanied by: caregiver, Teresa Lin  PERTINENT HISTORY: Per chart at last MD appt with Dr. Daun Lin, Teresa Lin is a 89 y.o. female who presents today for repeat evaluation status post a right wrist injury sustained on 06/06/2023. [place] landing directly on concrete, subsequent x-rays of the right wrist demonstrate evidence of a minimally displaced right distal ulna fracture. The patient was initially valued by me where she was placed into a short arm cast, she was last evaluated on 06/18/2023, she was instructed to follow-up with me in 1 week for repeat x-rays to ensure no shifting of the fracture was identified. The patient however got sick and did not  follow-up until today. The patient does still report some underlying discomfort in the right wrist, she does have a history of significant right wrist osteoarthritic changes. She is followed by rheumatology for Seronegative inflammatory arthritis. The patient has undergone infusions in the past for this condition. She denies any repeat trauma or injury for the right wrist. She denies any irritation from the short arm cast at today's visit.   PRECAUTIONS: None    WEIGHT BEARING RESTRICTIONS: No  PAIN:  Are you having pain?  7-9/10 pain in wrist and digits with certain motions FALLS: Has patient fallen in last 6 months? Yes. Number of falls unsure  LIVING ENVIRONMENT: Lives with: Lives alone Lives in: House/apartment Stairs: No    PLOF: Independent  PATIENT GOALS: Pt would like to decrease pain, increase ROM and be able to use hand functionally for daily tasks.    NEXT MD VISIT: unsure of next MD appt date  OBJECTIVE:  Note: Objective measures were completed at Evaluation unless otherwise noted.  HAND DOMINANCE: Right  ADLs: Overall ADLs: Pt reports she has difficulty and requires assistance with cutting food, pulling up her pants, performing housework, managing buttons, gripping and opening containers, flossing her teeth and  brushing her teeth.  She was living independently prior to fall but now has a caregiver to assist her 5 days a week from 10-3 daily.  She is not able to drive currently.     FUNCTIONAL OUTCOME MEASURES: To be assessed next session  UPPER EXTREMITY ROM:     Active ROM Right eval Left eval R 08/18/23 R 08/29/23 R 09/04/23 R 09/19/23  Shoulder flexion 90 90      Shoulder abduction 90 90      Shoulder adduction        Shoulder extension        Shoulder internal rotation        Shoulder external rotation        Elbow flexion 135 140      Elbow extension 0 0      Wrist flexion 47  35 30 after heat 44 20 after PROM and heat 30 40  Wrist extension 0  20 30  40 30  Wrist ulnar deviation    20 after heat 30 30 30   Wrist radial deviation    0 0 0  Wrist pronation 70  90   90  Wrist supination 50  75   75  (Blank rows = not tested)  Active ROM Right eval Left eval R 08/21/23 R 09/19/23  Thumb MCP (0-60) 45   50  Thumb IP (0-80) 35   35  Thumb Radial abd/add (0-55) 20   30 30   Thumb Palmar abd/add (0-45) 22   40 40  Thumb Opposition to Small Finger To small finger with pain Opposition to 5th pain in thumb   Opposition to side of 5th   Index MCP (0-90) 65  80 75 80  Index PIP (0-100) 70  75 80 80  Index DIP (0-70)        Long MCP (0-90)  90  90 90 90  Long PIP (0-100)  65  55 60 60  Long DIP (0-70)        Ring MCP (0-90)  90  90 95 95  Ring PIP (0-100)  90  90 95 95  Ring DIP (0-70)        Little MCP (0-90)  95  95 95 95  Little PIP (0-100)  90  90 95 95  Little DIP (0-70)        (Blank rows = not tested)  HAND FUNCTION: Eval: Grip strength: Right: 3 lbs; Left: 38 lbs, Lateral pinch: Right:   lbs, Left:   lbs, and 3 point pinch: Right:   lbs, Left:   lbs 08/01/23: Grip strength: Right: 10 lbs; Left: 38 lbs, Lateral pinch: Right:   lbs, Left:   lbs, and 3 point pinch: Right:   lbs, Left:   lbs 08/18/23: Grip strength: Right: 19 lbs; Left: 38 lbs, Lateral pinch: Right:   8lbs, Left:   lbs, and 3 point pinch: Right:  4 lbs, Left:   lbs 08/26/23: Grip strength: Right: 21 lbs; Left: 38 lbs, Lateral pinch: Right:   9lbs, Left:   lbs, and 3 point pinch: Right:  6 lbs, Left:   lbs 09/01/23: Grip strength: Right: 20 lbs; Left: 38 lbs, Lateral pinch: Right:   9lbs, Left:   lbs, and 3 point pinch: Right:  6 lbs, Left:   lbs  COORDINATION: Impaired coordination with decreased functional use of right hand and wrist   SENSATION: Eval Increased pain in wrist and hand, will continue to assess  EDEMA:  eval edema present in right hand and wrist   COGNITION: Overall cognitive status: Pt demonstrates difficulty at times recalling new learning and  information, benefits from repetition Areas of impairment: Memory: Deficits short term memory  TREATMENT DATE: 09/19/2023    Patient arrived today after not being seen for about 2 weeks.  Patient reports not really using thumb CMC neoprene as well as not really using heat in the morning. Reports some stiffness at the wrist and the thumb. With some discomfort and pain in the thumb CMC.  Patient to show increased strength with pushing and pulling heavy door.  Compared in the past Reinforced with patient again after taking measurements for digits and wrist active range of motion as well as grip and prehension Patient showed decreased wrist extension but strength improved to be able to stabilize during gripping Decreased grip and prehension strength because of thumb CMC discomfort and pain Reinforced with patient to use her CMC neoprene with any activity that causes her wrist or thumb CMC discomfort or pain To use heat in the morning before her exercises to maintain motion with less pain   Made her right hand out and provided to put in the kitchen for her to do her morning routine Using heat, CMC neoprene during the day as well as doing motion exercises and strengthening after the moist heat. Modalities: Fluidotherapy time: 8 Location: right hand and wrist To decrease pain, increase tissue mobility and increase ROM  Patient show increase ulnar deviation and wrist flexion after  fluido  Continue once a day 1 pound weight with CMC neoprene splint on radial ulnar deviation, supination and pronation, elbow flexion extension, scapular squeezes or rowing.  And forward punches.  12 reps 1 time a day.  Tolerating well. Upgrade patient with red Thera-Band for elbow extension as well as scapular retraction 12 reps pain-free  Continue after the heat in the morning her tendon glides her thumb palmar radial abduction opposition and wrist flexion extension range of motion exercises  Remind patient again  that she can wear during the day the [facility] neoprene with her home exercises as well as functional use to decrease pain in the thumb and wrist while getting some functional strengthening and grip and prehension.   see flowsheet. Cont to compare Measurements to when patient was seen in 23. Patient has rheumatoid arthritis and her baseline is impaired                                                                                                                           PATIENT EDUCATION: Education details: use of contrast for edema control, ROM exercises Person educated: Patient and Nurse, learning disability Education method: Explanation, Demonstration, Tactile cues, Verbal cues, and Handouts Education comprehension: verbalized understanding, verbal cues required, and needs further education  HOME EXERCISE PROGRAM: See above for details   GOALS: Goals reviewed with patient? Yes  SHORT TERM GOALS: MET  Pt will demonstrate the understanding of use of contrast  for edema control of right wrist and hand. Baseline: minimal knowledge of contrast at eval -now contrast if increased swelling and moist heat if stiffness Goal status met  2.  Pt will demonstrate oral hygiene with modified independence with use of built up handles on her toothbrush. Baseline: difficulty at eval  Goal status: Met   LONG TERM GOALS: Target date: 11/14/23  Pt will complete home exercise program with modified independence. Baseline: no current program now patient's exercises upgraded to red Thera-Band today but also needs still reviewing and to use her CMC neoprene to decrease pain to increase motion and strength Goal status: Progressing  2.  Pt will improve grip strength by 10 pounds to manage opening containers with modified independence. Baseline: difficulty at eval now will need reminders and review to use her CMC neoprene to decrease thumb CMC pain for her to maintain her increased grip and prehension  strength Goal status: I progressing  3.  Pt will improve strength of RUE to pull up pants with modified independence. Baseline: difficulty at eval Goal status: INITIAL  4.  Pt will improve hand function to be able to cut food with modified independence. Baseline: unable to cut food at eval  Goal status: Met  5.  Pt will demonstrate ability to perform buttons with modified independence. Baseline: difficulty with buttons at eval recommend for patient brought in hook Goal status: Met  6.  Pt will improve ROM to [facility] to manage opening doors. Baseline: limited ROM at eval, difficulty managing doors. Goal status: Met L  ASSESSMENT:  CLINICAL IMPRESSION: Patient seen for occupational therapy for right closed fracture of the lower end of the right ulna. Pt with a history of arthritis which continues to affect her hand function.  Patient was seen by this OT in 2023.  Patient is range of motion and strength in right wrist and hand was assessed and compared to 2023.  Patient's digit range of motion about normal for patient's baseline.  Patient also showing great progress to her baseline for supination pronation radial ulnar deviation and wrist flexion today.  Wrist extension still decreased making her ability to maintain wrist extension with grip harder but improving.  Reviewed with patient and caretaker again for patient to  wear CMC neoprene  for functional strengthening during the day with less pain.  Reviewed with patient again moist heat or contrast in the morning focusing and maintaining her wrist and digit active range of motion-   1 pound weight for wrist, forearm and an upgrade to red Thera-Band for elbow extension and scapular squeezes.  Remind patient again and provided her with a handout to put up in the kitchen for her to use her heat in the morning and use her CMC neoprene to decrease pain and wrist and thumb CMC for her to do home exercises and do functional strengthening.  Pt responds well  to re instruction of exercises as well as repetition for carry over.  Patient can benefit from skilled OT services to decrease pain increase her strength and range of motion -decreasing patient frequency gradually while monitoring as patient can maintain progress with home program.  PERFORMANCE DEFICITS: in functional skills including ADLs, IADLs, coordination, dexterity, sensation, edema, ROM, strength, pain, fascial restrictions, flexibility, Fine motor control, Gross motor control, balance, decreased knowledge of use of DME, skin integrity, and UE functional use, cognitive skills including attention and memory, and psychosocial skills including environmental adaptation, habits, and routines and behaviors.   IMPAIRMENTS: are limiting patient  from ADLs, IADLs, and social participation.   COMORBIDITIES: may have co-morbidities  that affects occupational performance. Patient will benefit from skilled OT to address above impairments and improve overall function.  MODIFICATION OR ASSISTANCE TO COMPLETE EVALUATION: Min-Moderate modification of tasks or assist with assess necessary to complete an evaluation.  OT OCCUPATIONAL PROFILE AND HISTORY: Detailed assessment: Review of records and additional review of physical, cognitive, psychosocial history related to current functional performance.  CLINICAL DECISION MAKING: Moderate - several treatment options, min-mod task modification necessary  REHAB POTENTIAL: Good  EVALUATION COMPLEXITY: Moderate      PLAN:  OT FREQUENCY: 1 time a week to biweekly/monthly  OT DURATION: 8 weeks  PLANNED INTERVENTIONS: 97168 OT Re-evaluation, 97535 self care/ADL training, 16109 therapeutic exercise, 97530 therapeutic activity, 97112 neuromuscular re-education, 97140 manual therapy, 97035 ultrasound, 97018 paraffin, 60454 fluidotherapy, 97010 moist heat, 97010 cryotherapy, 97034 contrast bath, 97760 Orthotics management and training, 09811 Splinting (initial  encounter), scar mobilization, passive range of motion, patient/family education, and DME and/or AE instructions  RECOMMENDED OTHER SERVICES: none currently  CONSULTED AND AGREED WITH PLAN OF CARE: Patient and family member/caregiver  PLAN FOR NEXT SESSION: continue with edema control measures, modify HEP as pt progresses.     Heloise Lobo, OTR/L,CLT 09/19/2023, 2:51 PM

## 2023-09-22 ENCOUNTER — Ambulatory Visit: Admitting: Occupational Therapy

## 2023-09-22 DIAGNOSIS — M25531 Pain in right wrist: Secondary | ICD-10-CM | POA: Diagnosis not present

## 2023-09-22 DIAGNOSIS — M6281 Muscle weakness (generalized): Secondary | ICD-10-CM

## 2023-09-22 DIAGNOSIS — M25631 Stiffness of right wrist, not elsewhere classified: Secondary | ICD-10-CM

## 2023-09-22 DIAGNOSIS — M25641 Stiffness of right hand, not elsewhere classified: Secondary | ICD-10-CM

## 2023-09-22 DIAGNOSIS — M79641 Pain in right hand: Secondary | ICD-10-CM

## 2023-09-22 DIAGNOSIS — R600 Localized edema: Secondary | ICD-10-CM

## 2023-09-22 NOTE — Therapy (Signed)
 OUTPATIENT OCCUPATIONAL THERAPY ORTHO TREATMENT/10th visit  Patient Name: CHAZMIN GANA MRN: [id number] DOB:1931-12-20, 89 y.o., female Today's Date: 09/22/2023  PCP: Antonietta Battles, MD REFERRING PROVIDER: Rojelio Clement, PA-C  END OF SESSION:  OT End of Session - 09/22/23 1517     Visit Number 10    Number of Visits 13    Date for OT Re-Evaluation 11/14/23    OT Start Time 1517    OT Stop Time 1607    OT Time Calculation (min) 50 min    Activity Tolerance Patient tolerated treatment well    Behavior During Therapy [facility] for tasks assessed/performed             Past Medical History:  Diagnosis Date   Anemia    Anxiety    Arthritis    osteoarthritis. Bilateral knee replacement, hands, meniscal tear, cervical disc disease   Atherosclerosis    Atypical chest pain    Collagen vascular disease (HCC)    Colon polyp 12/15/2017   Colon polyps    Compression fracture of L2 (HCC)    Diverticulosis    DVT (deep venous thrombosis) (HCC)    Fracture of one rib, right side, subsequent encounter for fracture with routine healing 03/08/2017   GERD (gastroesophageal reflux disease)    History of seronegative inflammatory arthritis    HOH (hard of hearing)    no hearing aids   Hypertension    IBS (irritable bowel syndrome)    Internal hemorrhoids    Phlebitis and thrombophlebitis of deep veins of lower extremities (HCC)    after surgery   Vision abnormalities    Past Surgical History:  Procedure Laterality Date   APPENDECTOMY     COLONOSCOPY N/A 01/24/2020   Procedure: COLONOSCOPY;  Surgeon: Shane Darling, MD;  Location: [facility] ENDOSCOPY;  Service: Endoscopy;  Laterality: N/A;   COLONOSCOPY WITH PROPOFOL  N/A 10/24/2017   Procedure: COLONOSCOPY WITH PROPOFOL ;  Surgeon: Cassie Click, MD;  Location: [facility] ENDOSCOPY;  Service: Endoscopy;  Laterality: N/A;   COLONOSCOPY WITH PROPOFOL  N/A 11/21/2017   Procedure: COLONOSCOPY WITH PROPOFOL ;  Surgeon: Cassie Click, MD;   Location: [facility] ENDOSCOPY;  Service: Endoscopy;  Laterality: N/A;   DILATION AND CURETTAGE OF UTERUS     ESOPHAGOGASTRODUODENOSCOPY (EGD) WITH PROPOFOL  N/A 02/25/2018   Procedure: ESOPHAGOGASTRODUODENOSCOPY (EGD) WITH PROPOFOL ;  Surgeon: Cassie Click, MD;  Location: [facility] ENDOSCOPY;  Service: Endoscopy;  Laterality: N/A;   EYE SURGERY     bilateral catarACT   JOINT REPLACEMENT Bilateral    knee replacement   LAPAROSCOPIC RIGHT COLECTOMY Right 12/15/2017   Procedure: LAPAROSCOPIC RIGHT COLECTOMY;  Surgeon: Marshall Skeeter, MD;  Location: [facility] ORS;  Service: General;  Laterality: Right;   ORIF HUMERUS FRACTURE Right 08/09/2021   Procedure: IRRIGATION AND DEBRIDEMENT; OPEN REDUCTION INTERNAL FIXATION (ORIF) DISTAL HUMERUS FRACTURE;  Surgeon: Hardy Lia, MD;  Location: [facility] OR;  Service: Orthopedics;  Laterality: Right;   REPLACEMENT TOTAL KNEE BILATERAL     TOTAL SHOULDER ARTHROPLASTY Right 09/24/2018   Procedure: TOTAL SHOULDER ARTHROPLASTY - REVERSE RIGHT;  Surgeon: Elner Hahn, MD;  Location: [facility] ORS;  Service: Orthopedics;  Laterality: Right;   Patient Active Problem List   Diagnosis Date Noted   At risk for falling 02/13/2023   Fracture of cervical vertebra, C6 (HCC) 02/13/2023   T1 vertebral fracture (HCC) 02/13/2023   Rib fractures 02/13/2023   Lumbar transverse process fracture (HCC) 02/13/2023   Leukocytosis 02/13/2023   Normocytic anemia 08/11/2021  Hypokalemia 08/09/2021   Displaced comminuted supracondylar fracture of right humerus without intercondylar fracture 08/08/2021   Open fracture of right humerus 08/08/2021   History of DVT (deep vein thrombosis) 06/06/2021   Rheumatoid arthritis of multiple sites without rheumatoid factor (HCC) 04/24/2021   History of pulmonary embolism 02/04/2020   History of blood clotting disorder 01/12/2020   Immunosuppression (HCC) 01/25/2019   Status post reverse total shoulder replacement, right 09/24/2018   Hyperglycemia 08/31/2018    S/P right colectomy 01/26/2018   Adenomatous polyp of ascending colon 11/24/2017   Generalized anxiety disorder 09/06/2017   Irritable bowel syndrome (IBS) 09/06/2017   Orthostasis 06/05/2016   Left shoulder pain 10/13/2015   Constipation 05/30/2015   Osteoporosis, postmenopausal 05/30/2015   Compression fracture of L2 vertebra with delayed healing 05/29/2015   Frequent falls 03/26/2015   Atherosclerosis of arteries 03/26/2015   Family history of colon cancer 09/28/2014   TMJ (temporomandibular joint syndrome) 06/12/2014   Varicose veins of both lower extremities without ulcer or inflammation 06/12/2014   B12 deficiency 09/20/2013   Fatigue 05/23/2013   Internal hemorrhoids without complication 04/20/2013   Insomnia 03/03/2013   Vertigo due to cerebrovascular disease 03/03/2013   Seronegative rheumatoid arthritis affecting lower leg (HCC) 03/03/2013    ONSET DATE: 06/07/2023  REFERRING DIAG: other fracture lower end right ulna, closed  THERAPY DIAG:  Pain in right wrist  Pain in right hand  Stiffness of right wrist, not elsewhere classified  Muscle weakness (generalized)  Localized edema  Stiffness of right hand, not elsewhere classified  Rationale for Evaluation and Treatment: Rehabilitation  SUBJECTIVE:   SUBJECTIVE STATEMENT: See note  Pt accompanied by: caregiver, Betty Bruckner  PERTINENT HISTORY: Per chart at last MD appt with Dr. Daun Epstein, Lashaundra Harper is a 89 y.o. female who presents today for repeat evaluation status post a right wrist injury sustained on 06/06/2023. [place] landing directly on concrete, subsequent x-rays of the right wrist demonstrate evidence of a minimally displaced right distal ulna fracture. The patient was initially valued by me where she was placed into a short arm cast, she was last evaluated on 06/18/2023, she was instructed to follow-up with me in 1 week for repeat x-rays to ensure no shifting of the  fracture was identified. The patient however got sick and did not follow-up until today. The patient does still report some underlying discomfort in the right wrist, she does have a history of significant right wrist osteoarthritic changes. She is followed by rheumatology for Seronegative inflammatory arthritis. The patient has undergone infusions in the past for this condition. She denies any repeat trauma or injury for the right wrist. She denies any irritation from the short arm cast at today's visit.   PRECAUTIONS: None    WEIGHT BEARING RESTRICTIONS: No  PAIN:  Are you having pain?  7-9/10 pain in wrist and digits with certain motions FALLS: Has patient fallen in last 6 months? Yes. Number of falls unsure  LIVING ENVIRONMENT: Lives with: Lives alone Lives in: House/apartment Stairs: No    PLOF: Independent  PATIENT GOALS: Pt would like to decrease pain, increase ROM and be able to use hand functionally for daily tasks.    NEXT MD VISIT: unsure of next MD appt date  OBJECTIVE:  Note: Objective measures were completed at Evaluation unless otherwise noted.  HAND DOMINANCE: Right  ADLs: Overall ADLs: Pt reports she has difficulty and requires assistance with cutting food, pulling up her pants,  performing housework, managing buttons, gripping and opening containers, flossing her teeth and brushing her teeth.  She was living independently prior to fall but now has a caregiver to assist her 5 days a week from 10-3 daily.  She is not able to drive currently.     FUNCTIONAL OUTCOME MEASURES: To be assessed next session  UPPER EXTREMITY ROM:     Active ROM Right eval Left eval R 08/18/23 R 08/29/23 R 09/04/23 R 09/19/23  Shoulder flexion 90 90      Shoulder abduction 90 90      Shoulder adduction        Shoulder extension        Shoulder internal rotation        Shoulder external rotation        Elbow flexion 135 140      Elbow extension 0 0      Wrist flexion 47  35 30 after  heat 44 20 after PROM and heat 30 40  Wrist extension 0  20 30 40 30  Wrist ulnar deviation    20 after heat 30 30 30   Wrist radial deviation    0 0 0  Wrist pronation 70  90   90  Wrist supination 50  75   75  (Blank rows = not tested)  Active ROM Right eval Left eval R 08/21/23 R 09/19/23  Thumb MCP (0-60) 45   50  Thumb IP (0-80) 35   35  Thumb Radial abd/add (0-55) 20   30 30   Thumb Palmar abd/add (0-45) 22   40 40  Thumb Opposition to Small Finger To small finger with pain Opposition to 5th pain in thumb   Opposition to side of 5th   Index MCP (0-90) 65  80 75 80  Index PIP (0-100) 70  75 80 80  Index DIP (0-70)        Long MCP (0-90)  90  90 90 90  Long PIP (0-100)  65  55 60 60  Long DIP (0-70)        Ring MCP (0-90)  90  90 95 95  Ring PIP (0-100)  90  90 95 95  Ring DIP (0-70)        Little MCP (0-90)  95  95 95 95  Little PIP (0-100)  90  90 95 95  Little DIP (0-70)        (Blank rows = not tested)  HAND FUNCTION: Eval: Grip strength: Right: 3 lbs; Left: 38 lbs, Lateral pinch: Right:   lbs, Left:   lbs, and 3 point pinch: Right:   lbs, Left:   lbs 08/01/23: Grip strength: Right: 10 lbs; Left: 38 lbs, Lateral pinch: Right:   lbs, Left:   lbs, and 3 point pinch: Right:   lbs, Left:   lbs 08/18/23: Grip strength: Right: 19 lbs; Left: 38 lbs, Lateral pinch: Right:   8lbs, Left:   lbs, and 3 point pinch: Right:  4 lbs, Left:   lbs 08/26/23: Grip strength: Right: 21 lbs; Left: 38 lbs, Lateral pinch: Right:   9lbs, Left:   lbs, and 3 point pinch: Right:  6 lbs, Left:   lbs 09/01/23: Grip strength: Right: 20 lbs; Left: 38 lbs, Lateral pinch: Right:   9lbs, Left:   lbs, and 3 point pinch: Right:  6 lbs, Left:   lbs 09/22/23: Grip strength: Right: 25 lbs; Left: 38 lbs, Lateral pinch: Right:   9lbs, Left:  lbs, and 3 point pinch: Right:  6 lbs, Left:   lbs  COORDINATION: Impaired coordination with decreased functional use of right hand and wrist   SENSATION: Eval Increased  pain in wrist and hand, will continue to assess  EDEMA:  eval edema present in right hand and wrist   COGNITION: Overall cognitive status: Pt demonstrates difficulty at times recalling new learning and information, benefits from repetition Areas of impairment: Memory: Deficits short term memory  TREATMENT DATE: 09/22/2023    Patient arrived after being seen last week.  Patient had increased pain at the wrist and thumb CMC resulting in decreased wrist extension and grip and prehension.  Reinforced with patient again the importance of using her heat or her CMC neoprene brace.  Appeared this past weekend patient did not work in the yard or the garden and was more sedentary.  Resulted in less pain at the wrist and thumb CMC.   Patient continues to reports not really using thumb CMC neoprene  But appears she is using her heat more.   This date patient's grip and prehension strength increase.  With less pain. Patient also able to stabilize with wrist extension more during grip.  Patient to show increased strength with pushing and pulling heavy door.  Compared in the past  Patient cont to show decreased wrist extension but strength improved to be able to stabilize during gripping Reviewed with patient care giver active assisted range of motion for thumb wrist flexion extension after fluidotherapy -  reinforced with patient to use her CMC neoprene with any activity that causes her wrist or thumb CMC discomfort or pain To use heat in the morning before her exercises to maintain motion with less pain   Made her  hand out and provided to put in the kitchen for her to do her morning routine Using heat, CMC neoprene during the day as well as doing motion exercises and strengthening after the moist heat. Modalities: Fluidotherapy time: 8 Location: right hand and wrist To decrease pain, increase tissue mobility and increase ROM  Patient show increase ulnar deviation and wrist flexion after   fluido  Continue once a day 1 pound weight with CMC neoprene splint on radial ulnar deviation, supination and pronation, elbow flexion extension, scapular squeezes or rowing.  And forward punches.  12 reps 1 time a day.  Tolerating well. Reviewed with the patient and caretaker red Thera-Band for elbow extension/flexion as well as scapular retraction 12 reps pain-free  Continue after the heat in the morning her tendon glides her thumb palmar radial abduction opposition and wrist flexion extension range of motion exercises  Remind patient again that she can wear during the day the [facility] neoprene with her home exercises as well as functional use to decrease pain in the thumb and wrist while getting some functional strengthening and grip and prehension.   see flowsheet. Cont to compare Measurements to when patient was seen in 23. Patient has rheumatoid arthritis and her baseline is impaired  Discussed with the patient balance and working out Patient has a hard time getting to the pool for exercises that she done in the past Patient was recommended a tai chi program that is the [facility] That is great for balance. [facility] program starts tomorrow 1 time a week for 6 weeks  PATIENT EDUCATION: Education details: use of contrast for edema control, ROM exercises Person educated: Patient and Nurse, learning disability Education method: Explanation, Demonstration, Tactile cues, Verbal cues, and Handouts Education comprehension: verbalized understanding, verbal cues required, and needs further education  HOME EXERCISE PROGRAM: See above for details   GOALS: Goals reviewed with patient? Yes  SHORT TERM GOALS: MET  Pt will demonstrate the understanding of use of contrast for edema control of right wrist and hand. Baseline: minimal knowledge of contrast at  eval -now contrast if increased swelling and moist heat if stiffness Goal status met  2.  Pt will demonstrate oral hygiene with modified independence with use of built up handles on her toothbrush. Baseline: difficulty at eval  Goal status: Met   LONG TERM GOALS: Target date: 11/14/23  Pt will complete home exercise program with modified independence. Baseline: no current program now patient's exercises upgraded to red Thera-Band today but also needs still reviewing and to use her CMC neoprene to decrease pain to increase motion and strength Goal status: Progressing  2.  Pt will improve grip strength by 10 pounds to manage opening containers with modified independence. Baseline: difficulty at eval now will need reminders and review to use her CMC neoprene to decrease thumb CMC pain for her to maintain her increased grip and prehension strength Goal status: I progressing  3.  Pt will improve strength of RUE to pull up pants with modified independence. Baseline: difficulty at eval Goal status: INITIAL  4.  Pt will improve hand function to be able to cut food with modified independence. Baseline: unable to cut food at eval  Goal status: Met  5.  Pt will demonstrate ability to perform buttons with modified independence. Baseline: difficulty with buttons at eval recommend for patient brought in hook Goal status: Met  6.  Pt will improve ROM to [facility] to manage opening doors. Baseline: limited ROM at eval, difficulty managing doors. Goal status: Met L  ASSESSMENT:  CLINICAL IMPRESSION: Patient seen for occupational therapy for right closed fracture of the lower end of the right ulna. Pt with a history of arthritis which continues to affect her hand function.  Patient was seen by this OT in 2023.  Patient is range of motion and strength in right wrist and hand was assessed and compared to 2023.  Patient's digit range of motion about normal for patient's baseline.  Patient also showing  great progress to her baseline for supination pronation radial ulnar deviation and wrist flexion today.  Wrist extension still decreased making her ability to maintain wrist extension with grip harder but improving.  Reviewed with patient and caretaker again for patient to  wear CMC neoprene  for functional strengthening during the day with less pain.  Reviewed with patient again moist heat or contrast in the morning focusing and maintaining her wrist and digit active range of motion-   1 pound weight for wrist, forearm and an upgrade to red Thera-Band for elbow extension and scapular squeezes.  Remind patient  last time provided handout to put up in the kitchen for her to use her heat in the morning and use her CMC neoprene to decrease pain and wrist and thumb CMC for her to do home exercises and do functional strengthening.  Pt responds well to re instruction of exercises as well as repetition for carry over.  Patient pain was decreased today with increased grip and prehension strength.  Patient to continue with home program for 3 weeks also refer her  to tai chi at [facility] for her balance.  Patient can benefit from skilled OT services to decrease pain increase her strength and range of motion -decreasing patient frequency gradually while monitoring as patient can maintain progress with home program.  PERFORMANCE DEFICITS: in functional skills including ADLs, IADLs, coordination, dexterity, sensation, edema, ROM, strength, pain, fascial restrictions, flexibility, Fine motor control, Gross motor control, balance, decreased knowledge of use of DME, skin integrity, and UE functional use, cognitive skills including attention and memory, and psychosocial skills including environmental adaptation, habits, and routines and behaviors.   IMPAIRMENTS: are limiting patient from ADLs, IADLs, and social participation.   COMORBIDITIES: may have co-morbidities  that affects occupational performance. Patient  will benefit from skilled OT to address above impairments and improve overall function.  MODIFICATION OR ASSISTANCE TO COMPLETE EVALUATION: Min-Moderate modification of tasks or assist with assess necessary to complete an evaluation.  OT OCCUPATIONAL PROFILE AND HISTORY: Detailed assessment: Review of records and additional review of physical, cognitive, psychosocial history related to current functional performance.  CLINICAL DECISION MAKING: Moderate - several treatment options, min-mod task modification necessary  REHAB POTENTIAL: Good  EVALUATION COMPLEXITY: Moderate      PLAN:  OT FREQUENCY: 1 time a week to biweekly/monthly  OT DURATION: 8 weeks  PLANNED INTERVENTIONS: 97168 OT Re-evaluation, 97535 self care/ADL training, 16109 therapeutic exercise, 97530 therapeutic activity, 97112 neuromuscular re-education, 97140 manual therapy, 97035 ultrasound, 97018 paraffin, 60454 fluidotherapy, 97010 moist heat, 97010 cryotherapy, 97034 contrast bath, 97760 Orthotics management and training, 09811 Splinting (initial encounter), scar mobilization, passive range of motion, patient/family education, and DME and/or AE instructions  RECOMMENDED OTHER SERVICES: none currently  CONSULTED AND AGREED WITH PLAN OF CARE: Patient and family member/caregiver  PLAN FOR NEXT SESSION: continue with edema control measures, modify HEP as pt progresses.     Heloise Lobo, OTR/L,CLT 09/22/2023, 5:00 PM

## 2023-09-29 ENCOUNTER — Encounter: Admitting: Occupational Therapy

## 2023-10-09 ENCOUNTER — Encounter: Admitting: Occupational Therapy

## 2023-10-13 ENCOUNTER — Ambulatory Visit: Admitting: Occupational Therapy

## 2023-10-28 ENCOUNTER — Ambulatory Visit: Attending: Student | Admitting: Occupational Therapy

## 2023-10-28 DIAGNOSIS — M25631 Stiffness of right wrist, not elsewhere classified: Secondary | ICD-10-CM | POA: Insufficient documentation

## 2023-10-28 DIAGNOSIS — M6281 Muscle weakness (generalized): Secondary | ICD-10-CM | POA: Diagnosis present

## 2023-10-28 DIAGNOSIS — M79641 Pain in right hand: Secondary | ICD-10-CM | POA: Insufficient documentation

## 2023-10-28 DIAGNOSIS — M25531 Pain in right wrist: Secondary | ICD-10-CM | POA: Insufficient documentation

## 2023-10-28 NOTE — Therapy (Signed)
 OUTPATIENT OCCUPATIONAL THERAPY ORTHO TREATMENT  Patient Name: Teresa Lin MRN: [id number] DOB:June 03, 1931, 89 y.o., female Today's Date: 10/28/2023  PCP: Teresa Battles, MD REFERRING PROVIDER: Rojelio Clement, PA-C  END OF SESSION:  OT End of Session - 10/28/23 1302     Visit Number 11    Number of Visits 13    Date for OT Re-Evaluation 11/14/23    OT Start Time 1203    OT Stop Time 1245    OT Time Calculation (min) 42 min    Activity Tolerance Patient tolerated treatment well    Behavior During Therapy [facility] for tasks assessed/performed          Past Medical History:  Diagnosis Date   Anemia    Anxiety    Arthritis    osteoarthritis. Bilateral knee replacement, hands, meniscal tear, cervical disc disease   Atherosclerosis    Atypical chest pain    Collagen vascular disease (HCC)    Colon polyp 12/15/2017   Colon polyps    Compression fracture of L2 (HCC)    Diverticulosis    DVT (deep venous thrombosis) (HCC)    Fracture of one rib, right side, subsequent encounter for fracture with routine healing 03/08/2017   GERD (gastroesophageal reflux disease)    History of seronegative inflammatory arthritis    HOH (hard of hearing)    no hearing aids   Hypertension    IBS (irritable bowel syndrome)    Internal hemorrhoids    Phlebitis and thrombophlebitis of deep veins of lower extremities (HCC)    after surgery   Vision abnormalities    Past Surgical History:  Procedure Laterality Date   APPENDECTOMY     COLONOSCOPY N/A 01/24/2020   Procedure: COLONOSCOPY;  Surgeon: Teresa Darling, MD;  Location: [facility] ENDOSCOPY;  Service: Endoscopy;  Laterality: N/A;   COLONOSCOPY WITH PROPOFOL  N/A 10/24/2017   Procedure: COLONOSCOPY WITH PROPOFOL ;  Surgeon: Teresa Click, MD;  Location: [facility] ENDOSCOPY;  Service: Endoscopy;  Laterality: N/A;   COLONOSCOPY WITH PROPOFOL  N/A 11/21/2017   Procedure: COLONOSCOPY WITH PROPOFOL ;  Surgeon: Teresa Click, MD;  Location: [facility]  ENDOSCOPY;  Service: Endoscopy;  Laterality: N/A;   DILATION AND CURETTAGE OF UTERUS     ESOPHAGOGASTRODUODENOSCOPY (EGD) WITH PROPOFOL  N/A 02/25/2018   Procedure: ESOPHAGOGASTRODUODENOSCOPY (EGD) WITH PROPOFOL ;  Surgeon: Teresa Click, MD;  Location: [facility] ENDOSCOPY;  Service: Endoscopy;  Laterality: N/A;   EYE SURGERY     bilateral catarACT   JOINT REPLACEMENT Bilateral    knee replacement   LAPAROSCOPIC RIGHT COLECTOMY Right 12/15/2017   Procedure: LAPAROSCOPIC RIGHT COLECTOMY;  Surgeon: Teresa Skeeter, MD;  Location: [facility] ORS;  Service: General;  Laterality: Right;   ORIF HUMERUS FRACTURE Right 08/09/2021   Procedure: IRRIGATION AND DEBRIDEMENT; OPEN REDUCTION INTERNAL FIXATION (ORIF) DISTAL HUMERUS FRACTURE;  Surgeon: Teresa Lia, MD;  Location: [facility] OR;  Service: Orthopedics;  Laterality: Right;   REPLACEMENT TOTAL KNEE BILATERAL     TOTAL SHOULDER ARTHROPLASTY Right 09/24/2018   Procedure: TOTAL SHOULDER ARTHROPLASTY - REVERSE RIGHT;  Surgeon: Teresa Hahn, MD;  Location: [facility] ORS;  Service: Orthopedics;  Laterality: Right;   Patient Active Problem List   Diagnosis Date Noted   At risk for falling 02/13/2023   Fracture of cervical vertebra, C6 (HCC) 02/13/2023   T1 vertebral fracture (HCC) 02/13/2023   Rib fractures 02/13/2023   Lumbar transverse process fracture (HCC) 02/13/2023   Leukocytosis 02/13/2023   Normocytic anemia 08/11/2021   Hypokalemia 08/09/2021  Displaced comminuted supracondylar fracture of right humerus without intercondylar fracture 08/08/2021   Open fracture of right humerus 08/08/2021   History of DVT (deep vein thrombosis) 06/06/2021   Rheumatoid arthritis of multiple sites without rheumatoid factor (HCC) 04/24/2021   History of pulmonary embolism 02/04/2020   History of blood clotting disorder 01/12/2020   Immunosuppression (HCC) 01/25/2019   Status post reverse total shoulder replacement, right 09/24/2018   Hyperglycemia 08/31/2018   S/P right  colectomy 01/26/2018   Adenomatous polyp of ascending colon 11/24/2017   Generalized anxiety disorder 09/06/2017   Irritable bowel syndrome (IBS) 09/06/2017   Orthostasis 06/05/2016   Left shoulder pain 10/13/2015   Constipation 05/30/2015   Osteoporosis, postmenopausal 05/30/2015   Compression fracture of L2 vertebra with delayed healing 05/29/2015   Frequent falls 03/26/2015   Atherosclerosis of arteries 03/26/2015   Family history of colon cancer 09/28/2014   TMJ (temporomandibular joint syndrome) 06/12/2014   Varicose veins of both lower extremities without ulcer or inflammation 06/12/2014   B12 deficiency 09/20/2013   Fatigue 05/23/2013   Internal hemorrhoids without complication 04/20/2013   Insomnia 03/03/2013   Vertigo due to cerebrovascular disease 03/03/2013   Seronegative rheumatoid arthritis affecting lower leg (HCC) 03/03/2013    ONSET DATE: 06/07/2023  REFERRING DIAG: other fracture lower end right ulna, closed  THERAPY DIAG:  Pain in right wrist  Pain in right hand  Stiffness of right wrist, not elsewhere classified  Muscle weakness (generalized)  Rationale for Evaluation and Treatment: Rehabilitation  SUBJECTIVE:   SUBJECTIVE STATEMENT: Just seen Dr. Mason Lin.  I cannot drive anymore.  I did not see since a month ago.  I had diarrhea and then also I had a flareup in my right knee and foot and I could not go to my grandson's wedding.  I needed help for 3 days Pt accompanied by: caregiver, Teresa Lin  PERTINENT HISTORY: Per chart at last MD appt with Dr. Daun Lin, Teresa Lin is a 89 y.o. female who presents today for repeat evaluation status post a right wrist injury sustained on 06/06/2023. [place] and [place] landing directly on concrete, subsequent x-rays of the right wrist demonstrate evidence of a minimally displaced right distal ulna fracture. The patient was initially valued by me where she was placed into a short arm cast, she was  last evaluated on 06/18/2023, she was instructed to follow-up with me in 1 week for repeat x-rays to ensure no shifting of the fracture was identified. The patient however got sick and did not follow-up until today. The patient does still report some underlying discomfort in the right wrist, she does have a history of significant right wrist osteoarthritic changes. She is followed by rheumatology for Seronegative inflammatory arthritis. The patient has undergone infusions in the past for this condition. She denies any repeat trauma or injury for the right wrist. She denies any irritation from the short arm cast at today's visit.   PRECAUTIONS: None    WEIGHT BEARING RESTRICTIONS: No  PAIN:  Are you having pain?  7-9/10 pain in wrist and digits with certain motions FALLS: Has patient fallen in last 6 months? Yes. Number of falls unsure  LIVING ENVIRONMENT: Lives with: Lives alone Lives in: House/apartment Stairs: No    PLOF: Independent  PATIENT GOALS: Pt would like to decrease pain, increase ROM and be able to use hand functionally for daily tasks.    NEXT MD VISIT: unsure of next MD appt date  OBJECTIVE:  Note: Objective measures were completed at Evaluation unless otherwise noted.  HAND DOMINANCE: Right  ADLs: Overall ADLs: Pt reports she has difficulty and requires assistance with cutting food, pulling up her pants, performing housework, managing buttons, gripping and opening containers, flossing her teeth and brushing her teeth.  She was living independently prior to fall but now has a caregiver to assist her 5 days a week from 10-3 daily.  She is not able to drive currently.     FUNCTIONAL OUTCOME MEASURES: To be assessed next session  UPPER EXTREMITY ROM:     Active ROM Right eval Left eval R 08/18/23 R 08/29/23 R 09/04/23 R 09/19/23 R 10/28/23  Shoulder flexion 90 90       Shoulder abduction 90 90       Shoulder adduction         Shoulder extension         Shoulder  internal rotation         Shoulder external rotation         Elbow flexion 135 140       Elbow extension 0 0       Wrist flexion 47  35 30 after heat 44 20 after PROM and heat 30 40 40  Wrist extension 0  20 30 40 30 0  Wrist ulnar deviation    20 after heat 30 30 30 30   Wrist radial deviation    0 0 0 0  Wrist pronation 70  90   90 90  Wrist supination 50  75   75 80  (Blank rows = not tested)  Active ROM Right eval Left eval R 08/21/23 R 09/19/23 R 10/28/23  Thumb MCP (0-60) 45   50 45  Thumb IP (0-80) 35   35 40  Thumb Radial abd/add (0-55) 20   30 30  40  Thumb Palmar abd/add (0-45) 22   40 40 42  Thumb Opposition to Small Finger To small finger with pain Opposition to 5th pain in thumb   Opposition to side of 5th  Opposition to 5th   Index MCP (0-90) 65  80 75 80 80  Index PIP (0-100) 70  75 80 80 80  Index DIP (0-70)         Long MCP (0-90)  90  90 90 90 90  Long PIP (0-100)  65  55 60 60 45  Long DIP (0-70)         Ring MCP (0-90)  90  90 95 95 95  Ring PIP (0-100)  90  90 95 95 95  Ring DIP (0-70)         Little MCP (0-90)  95  95 95 95 95  Little PIP (0-100)  90  90 95 95 95  Little DIP (0-70)         (Blank rows = not tested)  HAND FUNCTION: Eval: Grip strength: Right: 3 lbs; Left: 38 lbs, Lateral pinch: Right:   lbs, Left:   lbs, and 3 point pinch: Right:   lbs, Left:   lbs 08/01/23: Grip strength: Right: 10 lbs; Left: 38 lbs, Lateral pinch: Right:   lbs, Left:   lbs, and 3 point pinch: Right:   lbs, Left:   lbs 08/18/23: Grip strength: Right: 19 lbs; Left: 38 lbs, Lateral pinch: Right:   8lbs, Left:   lbs, and 3 point pinch: Right:  4 lbs, Left:   lbs 08/26/23: Grip strength: Right: 21 lbs; Left: 38 lbs,  Lateral pinch: Right:   9lbs, Left:   lbs, and 3 point pinch: Right:  6 lbs, Left:   lbs 09/01/23: Grip strength: Right: 20 lbs; Left: 38 lbs, Lateral pinch: Right:   9lbs, Left:   lbs, and 3 point pinch: Right:  6 lbs, Left:   lbs 09/22/23: Grip strength: Right: 25  lbs; Left: 38 lbs, Lateral pinch: Right:   9lbs, Left:   lbs, and 3 point pinch: Right:  6 lbs, Left:   lbs 10/28/23: Grip strength: Right: 26 lbs; Left: 40 lbs, Lateral pinch: Right:   11lbs, Left: 11  lbs, and 3 point pinch: Right:  6 lbs, Left: 9  lbs COORDINATION: Impaired coordination with decreased functional use of right hand and wrist   SENSATION: Eval Increased pain in wrist and hand, will continue to assess  EDEMA:  eval edema present in right hand and wrist   COGNITION: Overall cognitive status: Pt demonstrates difficulty at times recalling new learning and information, benefits from repetition Areas of impairment: Memory: Deficits short term memory  TREATMENT DATE: 10/28/2023     Patient arrived after not being seen for about a month.  Do appear patient had new flareup of CPPD in the right knee and foot. Could not weight-bear needed help from family. Continues to have some pain and swelling.  Patient this month was able to maintain her range of motion at the wrist digits as well as grip and prehension strength. Patient only lost about 30 degrees on the wrist extension Reviewed with the patient and caretaker about assisting patient with active assisted and passive range of motion for wrist extension daily.  Recommend for her to still continue to use her thumb CMC neoprene as needed Reviewed with patient if she has a flareup with increased edema and pain to do contrast.   If stiffness then paraffin or heat.     Continue once a day with home program 1 pound weight with CMC neoprene splint on radial ulnar deviation, supination and pronation, elbow flexion extension, scapular squeezes or rowing.  And forward punches.  12 reps 1 time a day.  Tolerating well. Reviewed with the patient and caretaker red Thera-Band for elbow extension/flexion as well as scapular retraction 12 reps pain-free  Continue after the heat in the morning her tendon glides her thumb palmar radial abduction  opposition and wrist flexion extension range of motion exercises  Remind patient again that she can wear during the day the [facility] neoprene with her home exercises as well as functional use to decrease pain in the thumb and wrist while getting some functional strengthening and grip and prehension.   Patient did attend one of the tai chi classes at the [facility] senior center That is great for balance. But patient had a flareup in knee pain and swelling. Recommend took for her to contact them and have them reimbursed and have her get on the next [employer] program   Patient to continue with home program at home.  PATIENT EDUCATION: Education details: use of contrast for edema control, ROM exercises Person educated: Patient and Nurse, learning disability Education method: Explanation, Demonstration, Tactile cues, Verbal cues, and Handouts Education comprehension: verbalized understanding, verbal cues required, and needs further education  HOME EXERCISE PROGRAM: See above for details   GOALS: Goals reviewed with patient? Yes  SHORT TERM GOALS: MET  Pt will demonstrate the understanding of use of contrast for edema control of right wrist and hand. Baseline: minimal knowledge of contrast at eval -now contrast if increased swelling and moist heat if stiffness Goal status met  2.  Pt will demonstrate oral hygiene with modified independence with use of built up handles on her toothbrush. Baseline: difficulty at eval  Goal status: Met   LONG TERM GOALS: Target date: 11/14/23  Pt will complete home exercise program with modified independence. Baseline: no current program now patient's exercises upgraded to red Thera-Band today but also needs still reviewing and to use her CMC neoprene to decrease pain to increase motion and strength Goal status: Met  2.  Pt will  improve grip strength by 10 pounds to manage opening containers with modified independence. Baseline: difficulty at eval now will need reminders and review to use her CMC neoprene to decrease thumb CMC pain for her to maintain her increased grip and prehension strength Goal status: Partial  3.  Pt will improve strength of RUE to pull up pants with modified independence. Baseline: difficulty at eval Goal status: Met  4.  Pt will improve hand function to be able to cut food with modified independence. Baseline: unable to cut food at eval  Goal status: Met  5.  Pt will demonstrate ability to perform buttons with modified independence. Baseline: difficulty with buttons at eval recommend for patient brought in hook Goal status: Met  6.  Pt will improve ROM to [facility] to manage opening doors. Baseline: limited ROM at eval, difficulty managing doors. Goal status: Met L  ASSESSMENT:  CLINICAL IMPRESSION: Patient seen for occupational therapy for right closed fracture of the lower end of the right ulna. Pt with a history of arthritis which continues to affect her hand function.  Patient was seen by this OT in 2023.  Patient is range of motion and strength in right wrist and hand was assessed and compared to 2023.  Patient's digit range of motion about normal for patient's baseline.  Patient also improved with wrist and forearm motion close to baseline.  As well as grip and pincer strength.  Patient done home program for the last month and return for follow-up.  Even with a flareup of knee pain and swelling as well as diarrhea.  Patient was able to maintain her range of motion and strength.  Patient can continue with home program at home.  Patient did lose some wrist extension reviewed with caretaker and patient to assist for active assisted range of motion and passive range of motion for wrist extension.  Also recommend for her to look in the close future again to return to the tai chi class for  balance.  PERFORMANCE DEFICITS: in functional skills including ADLs, IADLs, coordination, dexterity, sensation, edema, ROM, strength, pain, fascial restrictions, flexibility, Fine motor control, Gross motor control, balance, decreased knowledge of use of DME, skin integrity, and UE functional use, cognitive skills including attention and memory, and psychosocial skills including environmental adaptation, habits, and routines and behaviors.   IMPAIRMENTS: are limiting patient from ADLs, IADLs, and social participation.   COMORBIDITIES: may have co-morbidities  that affects occupational performance. Patient will benefit from skilled OT to address above impairments and improve overall function.  MODIFICATION OR ASSISTANCE TO COMPLETE EVALUATION: Min-Moderate modification of tasks or assist with assess necessary to complete an evaluation.  OT OCCUPATIONAL PROFILE AND HISTORY: Detailed assessment: Review of records and additional review of physical, cognitive, psychosocial history related to current functional performance.  CLINICAL DECISION MAKING: Moderate - several treatment options, min-mod task modification necessary  REHAB POTENTIAL: Good  EVALUATION COMPLEXITY: Moderate      PLAN:  OT FREQUENCY: 1 time a week to biweekly/monthly  OT DURATION: 8 weeks  PLANNED INTERVENTIONS: 97168 OT Re-evaluation, 97535 self care/ADL training, 64403 therapeutic exercise, 97530 therapeutic activity, 97112 neuromuscular re-education, 97140 manual therapy, 97035 ultrasound, 97018 paraffin, 47425 fluidotherapy, 97010 moist heat, 97010 cryotherapy, 97034 contrast bath, 97760 Orthotics management and training, 95638 Splinting (initial encounter), scar mobilization, passive range of motion, patient/family education, and DME and/or AE instructions  RECOMMENDED OTHER SERVICES: none currently  CONSULTED AND AGREED WITH PLAN OF CARE: Patient and family member/caregiver  PLAN FOR NEXT SESSION: continue with  edema control measures, modify HEP as pt progresses.     Heloise Lobo, OTR/L,CLT 10/28/2023, 1:03 PM

## 2024-03-04 ENCOUNTER — Ambulatory Visit
Admission: RE | Admit: 2024-03-04 | Discharge: 2024-03-04 | Disposition: A | Discharge status: Home / Self Care | Source: Ambulatory Visit | Attending: Physician Assistant | Admitting: Physician Assistant

## 2024-03-04 ENCOUNTER — Other Ambulatory Visit: Payer: Self-pay | Admitting: Physician Assistant

## 2024-03-04 DIAGNOSIS — S0990XA Unspecified injury of head, initial encounter: Secondary | ICD-10-CM | POA: Insufficient documentation
